# Patient Record
Sex: Male | Born: 1950 | Race: White | Hispanic: No | Marital: Married | Smoking: Former smoker
Health system: Southern US, Community
[De-identification: ages and names within clinical notes are randomized; demographics above are authoritative.]

## PROBLEM LIST (undated history)

## (undated) DIAGNOSIS — I10 Essential (primary) hypertension: Secondary | ICD-10-CM

## (undated) DIAGNOSIS — K219 Gastro-esophageal reflux disease without esophagitis: Secondary | ICD-10-CM

## (undated) DIAGNOSIS — N1831 Chronic kidney disease, stage 3a: Secondary | ICD-10-CM

## (undated) DIAGNOSIS — E785 Hyperlipidemia, unspecified: Secondary | ICD-10-CM

## (undated) DIAGNOSIS — Z951 Presence of aortocoronary bypass graft: Secondary | ICD-10-CM

## (undated) DIAGNOSIS — T7840XA Allergy, unspecified, initial encounter: Secondary | ICD-10-CM

## (undated) DIAGNOSIS — Z86010 Personal history of colonic polyps: Secondary | ICD-10-CM

## (undated) DIAGNOSIS — I4891 Unspecified atrial fibrillation: Secondary | ICD-10-CM

## (undated) DIAGNOSIS — I2510 Atherosclerotic heart disease of native coronary artery without angina pectoris: Secondary | ICD-10-CM

## (undated) DIAGNOSIS — R001 Bradycardia, unspecified: Secondary | ICD-10-CM

## (undated) DIAGNOSIS — M1990 Unspecified osteoarthritis, unspecified site: Secondary | ICD-10-CM

## (undated) DIAGNOSIS — I719 Aortic aneurysm of unspecified site, without rupture: Secondary | ICD-10-CM

## (undated) DIAGNOSIS — I219 Acute myocardial infarction, unspecified: Secondary | ICD-10-CM

## (undated) DIAGNOSIS — G4730 Sleep apnea, unspecified: Secondary | ICD-10-CM

## (undated) HISTORY — DX: Hyperlipidemia, unspecified: E78.5

## (undated) HISTORY — DX: Personal history of colonic polyps: Z86.010

## (undated) HISTORY — DX: Essential (primary) hypertension: I10

## (undated) HISTORY — DX: Allergy, unspecified, initial encounter: T78.40XA

## (undated) HISTORY — DX: Gastro-esophageal reflux disease without esophagitis: K21.9

## (undated) HISTORY — DX: Unspecified atrial fibrillation: I48.91

## (undated) HISTORY — DX: Presence of aortocoronary bypass graft: Z95.1

## (undated) HISTORY — PX: CORONARY ARTERY BYPASS GRAFT: SHX141

## (undated) HISTORY — DX: Aortic aneurysm of unspecified site, without rupture: I71.9

## (undated) HISTORY — DX: Chronic kidney disease, stage 3a: N18.31

## (undated) HISTORY — DX: Atherosclerotic heart disease of native coronary artery without angina pectoris: I25.10

## (undated) HISTORY — DX: Acute myocardial infarction, unspecified: I21.9

## (undated) HISTORY — DX: Unspecified osteoarthritis, unspecified site: M19.90

## (undated) HISTORY — PX: HERNIA REPAIR: SHX51

## (undated) HISTORY — PX: COLONOSCOPY: SHX174

## (undated) HISTORY — DX: Bradycardia, unspecified: R00.1

---

## 1999-10-12 DIAGNOSIS — Z951 Presence of aortocoronary bypass graft: Secondary | ICD-10-CM

## 1999-10-12 HISTORY — DX: Presence of aortocoronary bypass graft: Z95.1

## 2000-03-30 ENCOUNTER — Encounter: Payer: Self-pay | Admitting: Cardiology

## 2000-03-31 ENCOUNTER — Inpatient Hospital Stay (HOSPITAL_COMMUNITY): Admission: RE | Admit: 2000-03-31 | Discharge: 2000-04-04 | Payer: Self-pay | Admitting: Cardiology

## 2000-03-31 ENCOUNTER — Encounter: Payer: Self-pay | Admitting: Thoracic Surgery (Cardiothoracic Vascular Surgery)

## 2000-04-01 ENCOUNTER — Encounter: Payer: Self-pay | Admitting: Thoracic Surgery (Cardiothoracic Vascular Surgery)

## 2000-04-02 ENCOUNTER — Encounter: Payer: Self-pay | Admitting: Thoracic Surgery (Cardiothoracic Vascular Surgery)

## 2001-05-14 ENCOUNTER — Emergency Department (HOSPITAL_COMMUNITY): Admission: EM | Admit: 2001-05-14 | Discharge: 2001-05-14 | Payer: Self-pay | Admitting: Emergency Medicine

## 2001-05-14 ENCOUNTER — Encounter: Payer: Self-pay | Admitting: Emergency Medicine

## 2002-04-16 ENCOUNTER — Encounter: Payer: Self-pay | Admitting: Emergency Medicine

## 2002-04-16 ENCOUNTER — Emergency Department (HOSPITAL_COMMUNITY): Admission: EM | Admit: 2002-04-16 | Discharge: 2002-04-16 | Payer: Self-pay | Admitting: Emergency Medicine

## 2004-09-18 ENCOUNTER — Encounter (INDEPENDENT_AMBULATORY_CARE_PROVIDER_SITE_OTHER): Payer: Self-pay | Admitting: Specialist

## 2004-09-18 ENCOUNTER — Ambulatory Visit (HOSPITAL_COMMUNITY): Admission: RE | Admit: 2004-09-18 | Discharge: 2004-09-18 | Payer: Self-pay | Admitting: Gastroenterology

## 2005-01-18 ENCOUNTER — Ambulatory Visit: Payer: Self-pay

## 2005-01-25 ENCOUNTER — Ambulatory Visit: Payer: Self-pay | Admitting: *Deleted

## 2005-01-27 ENCOUNTER — Ambulatory Visit: Payer: Self-pay | Admitting: *Deleted

## 2005-06-04 ENCOUNTER — Ambulatory Visit: Payer: Self-pay | Admitting: *Deleted

## 2005-06-08 ENCOUNTER — Ambulatory Visit: Payer: Self-pay | Admitting: *Deleted

## 2005-06-27 ENCOUNTER — Encounter: Admission: RE | Admit: 2005-06-27 | Discharge: 2005-06-27 | Payer: Self-pay | Admitting: Neurology

## 2005-09-01 ENCOUNTER — Ambulatory Visit: Payer: Self-pay

## 2005-10-20 ENCOUNTER — Ambulatory Visit: Payer: Self-pay | Admitting: *Deleted

## 2006-06-07 ENCOUNTER — Ambulatory Visit: Payer: Self-pay | Admitting: *Deleted

## 2006-06-08 ENCOUNTER — Ambulatory Visit: Payer: Self-pay | Admitting: *Deleted

## 2006-07-08 ENCOUNTER — Ambulatory Visit: Payer: Self-pay

## 2006-12-20 ENCOUNTER — Ambulatory Visit: Payer: Self-pay

## 2007-01-26 ENCOUNTER — Ambulatory Visit: Payer: Self-pay | Admitting: *Deleted

## 2007-02-23 ENCOUNTER — Encounter: Admission: RE | Admit: 2007-02-23 | Discharge: 2007-02-23 | Payer: Self-pay | Admitting: *Deleted

## 2007-02-23 ENCOUNTER — Ambulatory Visit: Payer: Self-pay | Admitting: *Deleted

## 2007-08-24 ENCOUNTER — Ambulatory Visit: Payer: Self-pay | Admitting: *Deleted

## 2007-09-14 ENCOUNTER — Ambulatory Visit: Payer: Self-pay | Admitting: Cardiovascular Disease

## 2007-10-18 ENCOUNTER — Ambulatory Visit (HOSPITAL_COMMUNITY): Admission: RE | Admit: 2007-10-18 | Discharge: 2007-10-18 | Payer: Self-pay | Admitting: *Deleted

## 2007-10-18 ENCOUNTER — Ambulatory Visit: Payer: Self-pay | Admitting: *Deleted

## 2007-11-14 ENCOUNTER — Ambulatory Visit: Payer: Self-pay | Admitting: *Deleted

## 2007-12-01 ENCOUNTER — Inpatient Hospital Stay (HOSPITAL_COMMUNITY): Admission: RE | Admit: 2007-12-01 | Discharge: 2007-12-02 | Payer: Self-pay | Admitting: *Deleted

## 2007-12-01 ENCOUNTER — Ambulatory Visit: Payer: Self-pay | Admitting: *Deleted

## 2007-12-28 ENCOUNTER — Ambulatory Visit: Payer: Self-pay | Admitting: *Deleted

## 2007-12-28 ENCOUNTER — Encounter: Admission: RE | Admit: 2007-12-28 | Discharge: 2007-12-28 | Payer: Self-pay | Admitting: *Deleted

## 2008-06-27 ENCOUNTER — Encounter: Admission: RE | Admit: 2008-06-27 | Discharge: 2008-06-27 | Payer: Self-pay | Admitting: *Deleted

## 2008-06-27 ENCOUNTER — Ambulatory Visit: Payer: Self-pay | Admitting: *Deleted

## 2009-01-20 ENCOUNTER — Ambulatory Visit: Payer: Self-pay | Admitting: Cardiovascular Disease

## 2009-01-20 LAB — CONVERTED CEMR LAB
Albumin: 3.9 g/dL (ref 3.5–5.2)
Alkaline Phosphatase: 42 units/L (ref 39–117)
BUN: 23 mg/dL (ref 6–23)
Bilirubin, Direct: 0.5 mg/dL — ABNORMAL HIGH (ref 0.0–0.3)
CO2: 27 meq/L (ref 19–32)
Chloride: 106 meq/L (ref 96–112)
Creatinine, Ser: 1 mg/dL (ref 0.4–1.5)
LDL Cholesterol: 118 mg/dL — ABNORMAL HIGH (ref 0–99)
Total CHOL/HDL Ratio: 5

## 2009-01-22 DIAGNOSIS — E785 Hyperlipidemia, unspecified: Secondary | ICD-10-CM | POA: Insufficient documentation

## 2009-01-22 DIAGNOSIS — R001 Bradycardia, unspecified: Secondary | ICD-10-CM | POA: Insufficient documentation

## 2009-01-22 DIAGNOSIS — Z951 Presence of aortocoronary bypass graft: Secondary | ICD-10-CM | POA: Insufficient documentation

## 2009-01-22 DIAGNOSIS — I719 Aortic aneurysm of unspecified site, without rupture: Secondary | ICD-10-CM | POA: Insufficient documentation

## 2009-01-22 DIAGNOSIS — I10 Essential (primary) hypertension: Secondary | ICD-10-CM

## 2009-01-23 ENCOUNTER — Encounter: Payer: Self-pay | Admitting: Cardiovascular Disease

## 2009-01-23 ENCOUNTER — Ambulatory Visit: Payer: Self-pay | Admitting: Cardiovascular Disease

## 2009-01-23 DIAGNOSIS — F1010 Alcohol abuse, uncomplicated: Secondary | ICD-10-CM | POA: Insufficient documentation

## 2009-01-23 LAB — CONVERTED CEMR LAB
HDL goal, serum: 40 mg/dL
LDL Goal: 100 mg/dL

## 2009-02-13 ENCOUNTER — Ambulatory Visit: Payer: Self-pay | Admitting: *Deleted

## 2009-06-19 ENCOUNTER — Encounter (INDEPENDENT_AMBULATORY_CARE_PROVIDER_SITE_OTHER): Payer: Self-pay | Admitting: *Deleted

## 2009-07-01 ENCOUNTER — Telehealth: Payer: Self-pay | Admitting: Cardiovascular Disease

## 2009-09-01 ENCOUNTER — Ambulatory Visit: Payer: Self-pay | Admitting: Cardiovascular Disease

## 2009-09-18 ENCOUNTER — Encounter: Admission: RE | Admit: 2009-09-18 | Discharge: 2009-09-18 | Payer: Self-pay | Admitting: Vascular Surgery

## 2009-09-18 ENCOUNTER — Encounter: Payer: Self-pay | Admitting: Cardiovascular Disease

## 2009-09-18 ENCOUNTER — Ambulatory Visit: Payer: Self-pay | Admitting: Vascular Surgery

## 2009-10-20 ENCOUNTER — Ambulatory Visit: Payer: Self-pay | Admitting: Cardiovascular Disease

## 2009-10-22 LAB — CONVERTED CEMR LAB
ALT: 23 units/L (ref 0–53)
AST: 29 units/L (ref 0–37)
Bilirubin, Direct: 0.2 mg/dL (ref 0.0–0.3)
Cholesterol: 180 mg/dL (ref 0–200)
Total Protein: 7.3 g/dL (ref 6.0–8.3)
VLDL: 9.8 mg/dL (ref 0.0–40.0)

## 2009-10-23 ENCOUNTER — Ambulatory Visit: Payer: Self-pay | Admitting: Cardiovascular Disease

## 2009-10-29 ENCOUNTER — Telehealth: Payer: Self-pay | Admitting: Cardiovascular Disease

## 2009-11-03 ENCOUNTER — Telehealth: Payer: Self-pay | Admitting: Cardiovascular Disease

## 2009-11-12 ENCOUNTER — Telehealth: Payer: Self-pay | Admitting: Cardiovascular Disease

## 2009-11-17 ENCOUNTER — Telehealth: Payer: Self-pay | Admitting: Cardiovascular Disease

## 2009-11-18 ENCOUNTER — Ambulatory Visit: Payer: Self-pay | Admitting: Cardiovascular Disease

## 2009-11-18 DIAGNOSIS — F411 Generalized anxiety disorder: Secondary | ICD-10-CM | POA: Insufficient documentation

## 2009-11-19 ENCOUNTER — Ambulatory Visit: Payer: Self-pay | Admitting: Licensed Clinical Social Worker

## 2009-11-19 ENCOUNTER — Encounter: Payer: Self-pay | Admitting: Cardiovascular Disease

## 2009-11-24 ENCOUNTER — Ambulatory Visit: Payer: Self-pay | Admitting: Licensed Clinical Social Worker

## 2010-01-06 ENCOUNTER — Ambulatory Visit: Payer: Self-pay | Admitting: Cardiovascular Disease

## 2010-06-11 DIAGNOSIS — Z860100 Personal history of colon polyps, unspecified: Secondary | ICD-10-CM

## 2010-06-11 HISTORY — DX: Personal history of colonic polyps: Z86.010

## 2010-06-11 HISTORY — DX: Personal history of colon polyps, unspecified: Z86.0100

## 2010-08-07 ENCOUNTER — Encounter: Admission: RE | Admit: 2010-08-07 | Discharge: 2010-08-07 | Payer: Self-pay | Admitting: Family Medicine

## 2010-10-08 ENCOUNTER — Ambulatory Visit: Payer: Self-pay | Admitting: Vascular Surgery

## 2010-10-22 ENCOUNTER — Encounter: Payer: Self-pay | Admitting: Cardiovascular Disease

## 2010-11-10 NOTE — Consult Note (Signed)
Summary: Behavorial Medicine Note  Behavorial Medicine Note   Imported By: Marylou Mccoy 12/09/2009 12:59:31  _____________________________________________________________________  External Attachment:    Type:   Image     Comment:   External Document

## 2010-11-10 NOTE — Assessment & Plan Note (Signed)
Summary: 6wk f/u sl   Visit Type:  6 weeks follow up Primary Provider:  Theodoro Doing MD  CC:  No cardiac complains.  History of Present Illness: [name] Brown is seen today in F/U of AAA, CAD distant CABG in 2001 with no ischemia on myovue in 2007.  The last time I saw him he had an acute psychiatric issue with anxiety about his job requirements.  We got him in to see behavioral health and he is doing much better now.  He denies SOB, palpitaotns, SSCP, edema or syncope.  Post op EF is normal.  He did have a radial graft as part of his surgery.    Current Problems (verified): 1)  Anxiety State, Unspecified  (ICD-300.00) 2)  Alcohol Use  (ICD-305.00) 3)  Bradycardia  (ICD-427.89) 4)  Hyperlipidemia  (ICD-272.4) 5)  Hypertension  (ICD-401.9) 6)  Cad  (ICD-414.00) 7)  Aortic Aneurysm  (ICD-441.9)  Current Medications (verified): 1)  Altace 10 Mg Caps (Ramipril) .... Take 1 By Mouth Once Daily 2)  Hydrochlorothiazide 12.5 Mg Caps (Hydrochlorothiazide) .Marland Kitchen.. 1 Tab By Mouth Once Daily 3)  Zetia 10 Mg Tabs (Ezetimibe) .Marland Kitchen.. 1 Tab By Mouth Once Daily 4)  Centrum Silver  Tabs (Multiple Vitamins-Minerals) .Marland Kitchen.. 1 Tab By Mouth Once Daily 5)  Aspirin Ec 325 Mg Tbec (Aspirin) .... Take One Tablet By Mouth Daily 6)  Doxycycline Monohydrate .... 1/4 of A Tab By Mouth Two Times A Day 7)  Glucosamine-Chondroitin   Caps (Glucosamine-Chondroit-Vit C-Mn) .Marland Kitchen.. 1 Tab By Mouth Once Daily 8)  Niacin 500 Mg Tabs (Niacin) .... 2 Tabs Two Times A Day 9)  Verapamil Hcl 120 Mg Tabs (Verapamil Hcl) .Marland Kitchen.. 1 Tab By Mouth Three Times A Day 10)  Lopressor 50 Mg Tabs (Metoprolol Tartrate) .... Take 1/2 Tablet Two Times A Day  Allergies (verified): No Known Drug Allergies  Past History:  Past Medical History: Last updated: 02/09/09 Current Problems:  BRADYCARDIA (ICD-427.89) HYPERLIPIDEMIA (ICD-272.4) HYPERTENSION (ICD-401.9) CAD (ICD-414.00) CABG 2001 AORTIC ANEURYSM (ICD-441.9)  Past Surgical History: Last updated:  Feb 09, 2009 AAA stent graft on 12/01/2007 at [facility]. coronary bypass in 2001. Dr. Cornelius Moras.  Family History: Last updated: 02/09/09  Mother died at age 36 with complications of an MI.   Father had seven MIs prior to his death at age 34.   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  Alcohol Use - yes  Review of Systems       Denies fever, malais, weight loss, blurry vision, decreased visual acuity, cough, sputum, SOB, hemoptysis, pleuritic pain, palpitaitons, heartburn, abdominal pain, melena, lower extremity edema, claudication, or rash.   Vital Signs:  Patient profile:   60 year old male Height:      70 inches Weight:      193.25 pounds BMI:     27.83 Pulse rate:   42 / minute Pulse rhythm:   irregular Resp:     18 per minute BP sitting:   142 / 70  (left arm) Cuff size:   large  Vitals Entered By: Vikki Ports (January 06, 2010 9:20 AM)  Physical Exam  General:  Affect appropriate Healthy:  appears stated age HEENT: normal Neck supple with no adenopathy JVP normal no bruits no thyromegaly Lungs clear with no wheezing and good diaphragmatic motion Heart:  S1/S2 no murmur,rub, gallop or click PMI normal Abdomen: benighn, BS positve, no tenderness, no  AAA no bruit.  No HSM or HJR Distal pulses intact with no bruits No edema Neuro non-focal Skin warm and dry    Impression & Recommendations:  Problem # 1:  HYPERLIPIDEMIA (ICD-272.4) Intolerant to statins.  Continue meds and diet His updated medication list for this problem includes:    Zetia 10 Mg Tabs (Ezetimibe) .Marland Kitchen... 1 tab by mouth once daily    Niacin 500 Mg Tabs (Niacin) .Marland Kitchen... 2 tabs two times a day  CHOL: 180 (10/20/2009)   LDL: 127 (10/20/2009)   HDL: 42.80 (10/20/2009)   TG: 49.0 (10/20/2009) CHOL (goal): 200 (01/23/2009)   LDL (goal): 100 (01/23/2009)   HDL (goal): 40 (01/23/2009)    TG (goal): 150 (01/23/2009)  Problem # 2:  HYPERTENSION (ICD-401.9) Well controlled His updated medication list for this problem includes:    Altace 10 Mg Caps (Ramipril) .Marland Kitchen... Take 1 by mouth once daily    Hydrochlorothiazide 12.5 Mg Caps (Hydrochlorothiazide) .Marland Kitchen... 1 tab by mouth once daily    Aspirin Ec 325 Mg Tbec (Aspirin) .Marland Kitchen... Take one tablet by mouth daily    Verapamil Hcl 120 Mg Tabs (Verapamil hcl) .Marland Kitchen... 1 tab by mouth three times a day    Lopressor 50 Mg Tabs (Metoprolol tartrate) .Marland Kitchen... Take 1/2 tablet two times a day  Problem # 3:  CAD (ICD-414.00) Stable no angina His updated medication list for this problem includes:    Altace 10 Mg Caps (Ramipril) .Marland Kitchen... Take 1 by mouth once daily    Aspirin Ec 325 Mg Tbec (Aspirin) .Marland Kitchen... Take one tablet by mouth daily    Verapamil Hcl 120 Mg Tabs (Verapamil hcl) .Marland Kitchen... 1 tab by mouth three times a day    Lopressor 50 Mg Tabs (Metoprolol tartrate) .Marland Kitchen... Take 1/2 tablet two times a day  Problem # 4:  AORTIC ANEURYSM (ICD-441.9) S/P repair with no residual. [place] in a year  Problem # 5:  ANXIETY STATE, UNSPECIFIED (ICD-300.00) Improved continue F/U behavioral health.

## 2010-11-10 NOTE — Progress Notes (Signed)
Summary: med question  Phone Note Call from Patient Call back at [facility] Phone [phone]   Caller: Patient Reason for Call: Talk to Nurse Summary of Call: req call back about meds,PT REALLY WANT A CALL BACK ABOUT HIS MEDICATIONS. Initial call taken by: Migdalia Dk,  November 17, 2009 9:24 AM  Follow-up for Phone Call        spoke with pt, he has been having problems with side effects from his meds. he states he has lost his temper at work, been depressed, lost his sex drive and his appetite has changed. he feels this is due to the changing of his meds to get bp control. he has been taken out of work until he sees dr Eden Emms to talk about his meds and what is going on. pt scheduled to see dr Eden Emms tomorrow.pt had recently spoke with dr Eden Emms and was told to restart previous meds. script sent to pharm  Deliah Goody, RN  November 17, 2009 11:43 AM     New/Updated Medications: METOPROLOL TARTRATE 50 MG TABS (METOPROLOL TARTRATE) Take one half tablet by mouth twice a day Prescriptions: METOPROLOL TARTRATE 50 MG TABS (METOPROLOL TARTRATE) Take one half tablet by mouth twice a day  #30 x 12   Entered by:   Deliah Goody, RN   Authorized by:   Colon Branch, MD, [facility]   Signed by:   Deliah Goody, RN on 11/17/2009   Method used:   [facility] to        [facility]  [phone]* (retail)       [address] [place], [place]  [address]       Ph: [phone] or [phone]       Fax: [phone]   RxID:   [id number]

## 2010-11-10 NOTE — Assessment & Plan Note (Signed)
Summary: PER CHECK OTU/SF   Primary Provider:  Theodoro Doing MD  CC:  check up last visit pt was told to stop lopressor.  History of Present Illness: [name] Brown is seen today in F/U of AAA, CAD distant CABG in 2001 with no ischemia on myovue in 2007.  He has hyperlipidemia on Zetia and Niaspan.  He is intolerant to statins with severe myalgias.  His BP is well controlled but he has excessive bradycardia.  He is asymptomatic with no light headednss or presyncope.  He has a AAA that is followed by Dr. Madilyn Fireman and there is no endoleak.  I told him we would stop his BB and see how his HR does.  The patient does not want to stop his verapamil as it cured his cluster headaches. Since stopping his BB his HR is now in the low to mid 60's but his BP is elevated.  I told him we would add hydralizine and re-evaluate.  He seems to be ok with this plan  Current Problems (verified): 1)  Alcohol Use  (ICD-305.00) 2)  Bradycardia  (ICD-427.89) 3)  Hyperlipidemia  (ICD-272.4) 4)  Hypertension  (ICD-401.9) 5)  Cad  (ICD-414.00) 6)  Aortic Aneurysm  (ICD-441.9)  Current Medications (verified): 1)  Altace 10 Mg Caps (Ramipril) .... Take 1 By Mouth Once Daily 2)  Hydrochlorothiazide 12.5 Mg Caps (Hydrochlorothiazide) .Marland Kitchen.. 1 Tab By Mouth Once Daily 3)  Zetia 10 Mg Tabs (Ezetimibe) .Marland Kitchen.. 1 Tab By Mouth Once Daily 4)  Centrum Silver  Tabs (Multiple Vitamins-Minerals) .Marland Kitchen.. 1 Tab By Mouth Once Daily 5)  Aspirin Ec 325 Mg Tbec (Aspirin) .... Take One Tablet By Mouth Daily 6)  Verapamil Hcl Cr 120 Mg Xr24h-Cap (Verapamil Hcl) .... Take One Capsule Three Times A Day 7)  Doxycycline Monohydrate .... 1/4 of A Tab By Mouth Two Times A Day 8)  Glucosamine-Chondroitin   Caps (Glucosamine-Chondroit-Vit C-Mn) .Marland Kitchen.. 1 Tab By Mouth Once Daily 9)  Niacin 500 Mg Tabs (Niacin) .... 2 Tabs Two Times A Day 10)  Hydralazine Hcl 50 Mg Tabs (Hydralazine Hcl) .... One By Mouth Twice A Day  Allergies (verified): No Known Drug Allergies  Past  History:  Past Medical History: Last updated: 01/29/09 Current Problems:  BRADYCARDIA (ICD-427.89) HYPERLIPIDEMIA (ICD-272.4) HYPERTENSION (ICD-401.9) CAD (ICD-414.00) CABG 2001 AORTIC ANEURYSM (ICD-441.9)  Past Surgical History: Last updated: 29-Jan-2009 AAA stent graft on 12/01/2007 at [facility]. coronary bypass in 2001. Dr. Cornelius Moras.  Family History: Last updated: 01-29-09  Mother died at age 76 with complications of an MI.   Father had seven MIs prior to his death at age 7.   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  Alcohol Use - yes  Review of Systems       Denies fever, malais, weight loss, blurry vision, decreased visual acuity, cough, sputum, SOB, hemoptysis, pleuritic pain, palpitaitons, heartburn, abdominal pain, melena, lower extremity edema, claudication, or rash. All other sysmptoms reviewed and negative  Vital Signs:  Patient profile:   60 year old male Height:      70 inches Weight:      191 pounds BMI:     27.50 Pulse rate:   54 / minute Resp:     12 per minute BP sitting:   154 / 80  (left arm)  Vitals Entered By: Kem Parkinson (October 23, 2009 4:05 PM)  Physical Exam  General:  Affect appropriate Healthy:  appears stated age HEENT: normal Neck supple with no adenopathy JVP normal no bruits no thyromegaly Lungs clear with no wheezing and good diaphragmatic motion Heart:  S1/S2 no murmur,rub, gallop or click PMI normal Abdomen: benighn, BS positve, no tenderness, no AAA no bruit.  No HSM or HJR Distal pulses intact with no bruits No edema Neuro non-focal Skin warm and dry    Impression & Recommendations:  Problem # 1:  BRADYCARDIA (ICD-427.89) Resolved off BB His updated medication list for this problem includes:    Altace 10 Mg Caps (Ramipril) .Marland Kitchen... Take 1 by mouth once daily    Aspirin Ec 325 Mg Tbec  (Aspirin) .Marland Kitchen... Take one tablet by mouth daily    Verapamil Hcl Cr 120 Mg Xr24h-cap (Verapamil hcl) .Marland Kitchen... Take one capsule three times a day  Problem # 2:  HYPERLIPIDEMIA (ICD-272.4) Increase Niacin.  Intolerant to statins His updated medication list for this problem includes:    Zetia 10 Mg Tabs (Ezetimibe) .Marland Kitchen... 1 tab by mouth once daily    Niacin 500 Mg Tabs (Niacin) .Marland Kitchen... 2 tabs two times a day  Problem # 3:  HYPERTENSION (ICD-401.9) Add hydralazine.  F/U in 8 weeks His updated medication list for this problem includes:    Altace 10 Mg Caps (Ramipril) .Marland Kitchen... Take 1 by mouth once daily    Hydrochlorothiazide 12.5 Mg Caps (Hydrochlorothiazide) .Marland Kitchen... 1 tab by mouth once daily    Aspirin Ec 325 Mg Tbec (Aspirin) .Marland Kitchen... Take one tablet by mouth daily    Verapamil Hcl Cr 120 Mg Xr24h-cap (Verapamil hcl) .Marland Kitchen... Take one capsule three times a day    Hydralazine Hcl 50 Mg Tabs (Hydralazine hcl) ..... One by mouth twice a day  Patient Instructions: 1)  Your physician recommends that you schedule a follow-up appointment in: 8 to 10 weeks with Dr Eden Emms 2)  Your physician has recommended you make the following change in your medication: Start Hydralazine 50 mg one twice a day Prescriptions: HYDRALAZINE HCL 50 MG TABS (HYDRALAZINE HCL) one by mouth twice a day  #60 x 11   Entered by:   Charolotte Capuchin, RN   Authorized by:   Colon Branch, MD, [facility]   Signed by:   Charolotte Capuchin, RN on 10/23/2009   Method used:   [facility]ron[facility] to        [facility]  [phone]* (retail)       [address] [place], [place]  [address]       Ph: [phone] or [phone]       Fax: [phone]   RxID:   [id number]

## 2010-11-10 NOTE — Progress Notes (Signed)
Summary: BP  Phone Note Call from Patient Call back at Work Phone [phone]   Caller: Patient Summary of Call: BP is still going out of wack.... tired of dealing with this, request to speak to dr Initial call taken by: Migdalia Dk,  November 12, 2009 8:17 AM  Follow-up for Phone Call        spoke with pt, he has been taking clonidine for one week now and his bp is averaging 150/90 and his heart rate is 40. he is tired of changing meds and wants to go back to what he was originally on. will foward to dr Eden Emms for his review Deliah Goody, RN  November 12, 2009 9:00 AM    Additional Follow-up for Phone Call Additional follow up Details #1::        Left message for patient.  Ok to go back to old regimen.  F/U with me in 4 weeks Additional Follow-up by: Colon Branch, MD, [facility],  November 12, 2009 4:47 PM

## 2010-11-10 NOTE — Progress Notes (Signed)
Summary: bp meds  Phone Note Call from Patient Call back at [phone]   Caller: Patient Reason for Call: Talk to Nurse Summary of Call: [name] Brown request new BP meds Initial call taken by: Migdalia Dk,  November 03, 2009 8:05 AM  Follow-up for Phone Call        spoke with [name] Brown, the hydralazine is causing [name] Brown to have a headache all the time, he is not sleeping well. bp cont to be elevated 150/80. he is currently taking verapamil 120mg  three times a day. he has stopped the hydralazine. he states he is waking in the middle of the night and his bp is 190/100. [name] Brown requesting different bp med.  will foward to dr Eden Emms for his  review Deliah Goody, RN  November 03, 2009 9:54 AM   Additional Follow-up for Phone Call Additional follow up Details #1::        Call in clonidine .1 mg two times a day and stop hydralazine Additional Follow-up by: Colon Branch, MD, [facility],  November 03, 2009 11:51 AM     Appended Document: bp meds [name] Brown aware of med change and will call with any problems Deliah Goody, RN  November 04, 2009 11:08 AM    Clinical Lists Changes  Medications: Changed medication from HYDRALAZINE HCL 50 MG TABS (HYDRALAZINE HCL) 1 tablet 3 times per day to CLONIDINE HCL 0.1 MG TABS (CLONIDINE HCL) Take one tablet by mouth twice a day - Signed Rx of CLONIDINE HCL 0.1 MG TABS (CLONIDINE HCL) Take one tablet by mouth twice a day;  #60 x 12;  Signed;  Entered by: Deliah Goody, RN;  Authorized by: Colon Branch, MD, [facility];  Method used: [facility] to [facility]  [phone]*, [address], [place], [place]  [address], Ph: [phone] or [phone], Fax: [phone]    Prescriptions: CLONIDINE HCL 0.1 MG TABS (CLONIDINE HCL) Take one tablet by mouth twice a day  #60 x 12   Entered by:   Deliah Goody, RN   Authorized by:   Colon Branch, MD, [facility]   Signed by:   Deliah Goody, RN on 11/04/2009   Method used:   Electronically to        [facility]  [phone]*  (retail)       [address] [place], [place]  [address]       Ph: [phone] or [phone]       Fax: [phone]   RxID:   [id number]
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
## 2010-11-10 NOTE — Progress Notes (Signed)
Summary: pt having problem with b/p meds  Phone Note Call from Patient Call back at Work Phone [phone]   Caller: Patient Reason for Call: Talk to Nurse, Talk to Doctor Summary of Call: per pt call his b/p meds was changed about a week ago and he is having trouble with it and he wants to talk to you about it Initial call taken by: Omer Jack,  October 29, 2009 8:09 AM  Follow-up for Phone Call        pt calling back about his b/p.sign Follow-up by: Judie Grieve,  October 29, 2009 12:57 PM  Additional Follow-up for Phone Call Additional follow up Details #1::        pt calling again, req call back asap,Jennifer [facility]  October 29, 2009 1:43 PM  I spoke with the pt- He states his bp has been running up since coming off lopressor and starting on hydralazine 50mg  two times a day. He reports waking up during the night with HA's and his bp running about 170/100 around 2 am with HR- 58-60, and around 6am his bp is around 155/95 HR- 62. The pt states he feels out of breath. He takes his hydralazine around 6a/6p. I verified that the pt is taking his other medications as prescribed. We will discuss this with Dr. Eden Emms and call the pt back today @ [phone]. The pt is agreeable.  Additional Follow-up by: Sherri Rad, RN, BSN,  October 29, 2009 2:53 PM    Additional Follow-up for Phone Call Additional follow up Details #2::    Discussed above issue with Dr.Malissie Musgrave ...he advised that patient increase verapamil to 240mg  for am and evening dose and keep the mid-day dose at 120mg . He also advised that patient increase Hydralazine to 50 mg 3 times per day. Patient aware of all medication changes and will call back if BP does not improve. Follow-up by: J REISS RN  New/Updated Medications: VERAPAMIL HCL 120 MG TABS (VERAPAMIL HCL) 2 tablets in am, 1 tablet mid-day,   and 2 tablets in pm HYDRALAZINE HCL 50 MG TABS (HYDRALAZINE HCL) 1 tablet 3 times per day

## 2010-12-16 ENCOUNTER — Telehealth: Payer: Self-pay | Admitting: Cardiovascular Disease

## 2010-12-22 NOTE — Progress Notes (Signed)
Summary: pt calling blood work  Phone Note Call from Patient   Caller: Patient [phone] Reason for Call: Talk to Nurse Summary of Call: pt calling re appt 3-29 wants blood work at [facility] Initial call taken by: Glynda Jaeger,  December 16, 2010 9:35 AM  Follow-up for Phone Call        spoke with pt, labs ordered Deliah Goody, RN  December 16, 2010 3:55 PM

## 2010-12-25 ENCOUNTER — Encounter: Payer: Self-pay | Admitting: Cardiovascular Disease

## 2011-01-01 ENCOUNTER — Other Ambulatory Visit (INDEPENDENT_AMBULATORY_CARE_PROVIDER_SITE_OTHER): Payer: 59

## 2011-01-01 ENCOUNTER — Encounter: Payer: Self-pay | Admitting: *Deleted

## 2011-01-01 DIAGNOSIS — I2510 Atherosclerotic heart disease of native coronary artery without angina pectoris: Secondary | ICD-10-CM

## 2011-01-01 DIAGNOSIS — E7800 Pure hypercholesterolemia, unspecified: Secondary | ICD-10-CM

## 2011-01-01 DIAGNOSIS — I10 Essential (primary) hypertension: Secondary | ICD-10-CM

## 2011-01-01 DIAGNOSIS — Z79899 Other long term (current) drug therapy: Secondary | ICD-10-CM

## 2011-01-01 LAB — HEPATIC FUNCTION PANEL
ALT: 19 U/L (ref 0–53)
Alkaline Phosphatase: 50 U/L (ref 39–117)
Bilirubin, Direct: 0.1 mg/dL (ref 0.0–0.3)
Total Bilirubin: 0.9 mg/dL (ref 0.3–1.2)
Total Protein: 6.5 g/dL (ref 6.0–8.3)

## 2011-01-01 LAB — BASIC METABOLIC PANEL
GFR: 91.78 mL/min (ref >60.00)
Potassium: 4.6 mEq/L (ref 3.5–5.1)
Sodium: 140 mEq/L (ref 135–145)

## 2011-01-01 LAB — CBC WITH DIFFERENTIAL/PLATELET
Basophils Absolute: 0 10*3/uL (ref 0.0–0.1)
Basophils Relative: 0.5 % (ref 0.0–3.0)
Eosinophils Relative: 3.2 % (ref 0.0–5.0)
Hemoglobin: 14.5 g/dL (ref 13.0–17.0)
Lymphocytes Relative: 25.2 % (ref 12.0–46.0)
Monocytes Relative: 9.7 % (ref 3.0–12.0)
Neutro Abs: 4.4 10*3/uL (ref 1.4–7.7)
RBC: 4.3 Mil/uL (ref 4.22–5.81)
WBC: 7.2 10*3/uL (ref 4.5–10.5)

## 2011-01-01 LAB — LIPID PANEL
Total CHOL/HDL Ratio: 5
Triglycerides: 75 mg/dL (ref 0.0–149.0)

## 2011-01-01 LAB — TSH: TSH: 0.92 u[IU]/mL (ref 0.35–5.50)

## 2011-01-04 ENCOUNTER — Telehealth: Payer: Self-pay | Admitting: *Deleted

## 2011-01-04 NOTE — Telephone Encounter (Signed)
pt aware of results  

## 2011-01-04 NOTE — Telephone Encounter (Signed)
Message copied by Deliah Goody on Mon Jan 04, 2011  4:44 PM ------      Message from: Charlton Haws      Created: Mon Jan 04, 2011  9:09 AM       Labs reviewed and are normal.  No changes in medication or F/U needed.

## 2011-01-07 ENCOUNTER — Ambulatory Visit (INDEPENDENT_AMBULATORY_CARE_PROVIDER_SITE_OTHER): Payer: 59 | Admitting: Cardiovascular Disease

## 2011-01-07 ENCOUNTER — Encounter: Payer: Self-pay | Admitting: Cardiovascular Disease

## 2011-01-07 ENCOUNTER — Other Ambulatory Visit: Payer: Self-pay | Admitting: Cardiovascular Disease

## 2011-01-07 VITALS — BP 126/74 | HR 40 | Resp 18 | Ht 71.0 in | Wt 192.4 lb

## 2011-01-07 DIAGNOSIS — I2510 Atherosclerotic heart disease of native coronary artery without angina pectoris: Secondary | ICD-10-CM

## 2011-01-07 DIAGNOSIS — I498 Other specified cardiac arrhythmias: Secondary | ICD-10-CM

## 2011-01-07 DIAGNOSIS — F411 Generalized anxiety disorder: Secondary | ICD-10-CM

## 2011-01-07 DIAGNOSIS — Z951 Presence of aortocoronary bypass graft: Secondary | ICD-10-CM

## 2011-01-07 DIAGNOSIS — I719 Aortic aneurysm of unspecified site, without rupture: Secondary | ICD-10-CM

## 2011-01-07 DIAGNOSIS — I10 Essential (primary) hypertension: Secondary | ICD-10-CM

## 2011-01-07 DIAGNOSIS — F1010 Alcohol abuse, uncomplicated: Secondary | ICD-10-CM

## 2011-01-07 DIAGNOSIS — E785 Hyperlipidemia, unspecified: Secondary | ICD-10-CM

## 2011-01-07 MED ORDER — METOPROLOL TARTRATE 50 MG PO TABS: 50.0000 mg | ORAL_TABLET | Freq: Every day | ORAL | Status: DC

## 2011-01-07 NOTE — Assessment & Plan Note (Signed)
Normal exam.  [place] no endoleak and AAA decrease in size.  F/U Dr Edilia Bo [facility]

## 2011-01-07 NOTE — Assessment & Plan Note (Signed)
Well controlled  Continue meds and low sodium DASH type diet

## 2011-01-07 NOTE — Assessment & Plan Note (Signed)
Considering he cant take statins LDL of 117 ok.  Dietary counseling provided

## 2011-01-07 NOTE — Patient Instructions (Addendum)
Your physician recommends that you schedule a follow-up appointment in: 6 months  Decrease lopresser to once/day

## 2011-01-07 NOTE — Assessment & Plan Note (Signed)
Stabel with no angina  Continue ASA

## 2011-01-07 NOTE — Letter (Signed)
Summary: Custom - Lipid  [facility], Main Office  [address]   [place], [place] [address]   Phone: [phone]  Fax: [phone]     January 01, 2011 MRN: [id number]   [facility] [address] [place], [place]  [address]   Dear [name] Brown,  We have reviewed your cholesterol results.  They are as follows:     Total Cholesterol:    167 (Desirable: less than 200)       HDL  Cholesterol:     36.20  (Desirable: greater than 40 for men and 50 for women)       LDL Cholesterol:       116  (Desirable: less than 100 for low risk and less than 70 for moderate to high risk)       Triglycerides:       75.0  (Desirable: less than 150)  Our recommendations include:These numbers look good. Continue on the same medicine. Sodium, potassium, blood count, kidney, thyroid and Liver function are all normal. Take care, Dr. Leanora Cover   Call our office at the number listed above if you have any questions.  Lowering your LDL cholesterol is important, but it is only one of a large number of "risk factors" that may indicate that you are at risk for heart disease, stroke or other complications of hardening of the arteries.  Other risk factors include:   A.  Cigarette Smoking* B.  High Blood Pressure* C.  Obesity* D.   Low HDL Cholesterol (see yours above)* E.   Diabetes Mellitus (higher risk if your is uncontrolled) F.  Family history of premature heart disease G.  Previous history of stroke or cardiovascular disease    *These are risk factors YOU HAVE CONTROL OVER.  For more information, visit .  There is now evidence that lowering the TOTAL CHOLESTEROL AND LDL CHOLESTEROL can reduce the risk of heart disease.  The American Heart Association recommends the following guidelines for the treatment of elevated cholesterol:  1.  If there is now current heart disease and less than two risk factors, TOTAL CHOLESTEROL should be less than 200 and LDL CHOLESTEROL should be less  than 100. 2.  If there is current heart disease or two or more risk factors, TOTAL CHOLESTEROL should be less than 200 and LDL CHOLESTEROL should be less than 70.  A diet low in cholesterol, saturated fat, and calories is the cornerstone of treatment for elevated cholesterol.  Cessation of smoking and exercise are also important in the management of elevated cholesterol and preventing vascular disease.  Studies have shown that 30 to 60 minutes of physical activity most days can help lower blood pressure, lower cholesterol, and keep your weight at a healthy level.  Drug therapy is used when cholesterol levels do not respond to therapeutic lifestyle changes (smoking cessation, diet, and exercise) and remains unacceptably high.  If medication is started, it is important to have you levels checked periodically to evaluate the need for further treatment options.  Thank you,    [employer] Team

## 2011-01-07 NOTE — Assessment & Plan Note (Signed)
Much improved.  Continue F/U with primary and behavioral health

## 2011-01-07 NOTE — Progress Notes (Signed)
[name] Brown is seen today for F/U of elevated lipids, HTN, CABG in 2001, AAA with endograf 2009.  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  Compliant with meds.  Denies SSCP, dyspnea, palpitations, abdominal pain edema.  Some bradycardia in past with beta blockers and severe myalgias with statins.  Las LDL this month 117.  Reviewed records and he has had severe myalias with all statins and elevated CPK's.  Called Dr Adele Dan office and reviewed records.  Had endograf 2009 for 5.3 cm aneurysm.  [place] 10/22/10 No endoleak and residual aneurysm 3 3.7 cm decreased in size.  Disucssed bradycardia with him.  Will take lopresser once per day.. Reviewed his home BP/HR readings and BP ok but HR in 40's majority of time.  Patient hesitant to stop lopresser totally but will cut back.  Wants to stary on verapamil as this helps with cluster headaches.  No symptomatic bradycardia.  Still employed at [employer] in [place].  Stress level much better.  Abstaining from drugs and alchohol.  ROS: Denies fever, malais, weight loss, blurry vision, decreased visual acuity, cough, sputum, SOB, hemoptysis, pleuritic pain, palpitaitons, heartburn, abdominal pain, melena, lower extremity edema, claudication, or rash.   General: Affect appropriate Healthy:  appears stated age HEENT: normal Neck supple with no adenopathy JVP normal no bruits no thyromegaly Lungs clear with no wheezing and good diaphragmatic motion Heart:  S1/S2 no murmur,rub, gallop or click PMI normal Abdomen: benighn, BS positve, no tenderness, no AAA no bruit.  No HSM or HJR Distal pulses intact with no bruits No edema Neuro non-focal Skin warm and dry No muscular weakness   Current Outpatient Prescriptions  Medication Sig Dispense Refill  . aspirin 325 MG tablet Take 325 mg by mouth daily.        Marland Kitchen doxycycline (VIBRAMYCIN) 100 MG capsule Take 100 mg by mouth. 1/4 tab 2 times daily       . ezetimibe (ZETIA) 10 MG tablet Take 10  mg by mouth daily.        . fish oil-omega-3 fatty acids 1000 MG capsule Take 1 g by mouth 2 (two) times daily.        . Glucosamine-Chondroitin (GLUCOSAMINE CHONDR COMPLEX PO) 500 mg 2 times daily       . hydrochlorothiazide (,MICROZIDE/HYDRODIURIL,) 12.5 MG capsule Take 12.5 mg by mouth daily.        . metoprolol (LOPRESSOR) 50 MG tablet Take 1 tablet (50 mg total) by mouth daily. 1/2 tab po once daily      . Multiple Vitamins-Minerals (CENTRUM PO) 1 tab po qd       . niacin (NIASPAN) 1000 MG CR tablet Take 1,000 mg by mouth 2 (two) times daily.        . ramipril (ALTACE) 10 MG tablet Take 10 mg by mouth daily.        . verapamil (VERELAN PM) 120 MG 24 hr capsule 1 tab po tid       . DISCONTD: metoprolol (LOPRESSOR) 50 MG tablet 1/2 tab po bid         Allergies  Statins  Electrocardiogram:  Marked sinus bradycardia 40  Otherwise normal  Assessment and Plan

## 2011-01-07 NOTE — Assessment & Plan Note (Signed)
Decrease lopresser.  I think he can come off it completely but patient like low HR and wants to stay on some.  Will call us in 4 weeks if HR at home not at least in 50's

## 2011-02-23 NOTE — Assessment & Plan Note (Signed)
OFFICE VISIT   [name] Brown, [name] Brown  DOB:  1950-12-25                                       09/18/2009  ZOXWR#:[id number]   I saw the patient in the office today for followup of his endovascular  aneurysm repair.  He had a 5.3 cm infrarenal abdominal aortic aneurysm  and underwent endovascular repair by Dr. Madilyn Fireman on 12/01/2007.  On his  last followup visit the aorta had shrunk down to 4.5 cm.  He comes in  for a 6 month followup visit.  Since he was seen last he denies any  abdominal or back pain.   REVIEW OF SYSTEMS:  He has had no recent chest pain, chest pressure,  palpitations or arrhythmias.  He has had no claudication, rest pain or  nonhealing ulcers.  PULMONARY:  He has had no productive cough, bronchitis, asthma or  wheezing.   PHYSICAL EXAMINATION:  General:  This is a pleasant 60 year old  gentleman who appears his stated age.  Vital signs:  Blood pressure is  184/92, heart rate is 54.  Temperature is 98.  Lungs:  Are clear  bilaterally to auscultation.  Cardiovascular:  I do not detect any  carotid bruits.  He has a regular rate and rhythm without murmur  appreciated.  He has palpable femoral and pedal pulses bilaterally.  He  has a palpable right radial pulse.  He has had a previous left radial  artery harvest for CABG.  Abdomen:  Soft and nontender.  I cannot  palpate his aneurysm.  Neurological:  He has no focal weakness or  paresthesias.   I reviewed his CT scan and by my measurement the aneurysm has continued  to decrease in size and now measures 3.4 cm in maximum diameter.  There  is no evidence of endo leak and the stent graft is in good position.   Given that the aneurysm has shrunk down quite nicely I think at this  point it is safe to stretch his followup out to 1 year.  We will see him  back in 1 year for followup duplex scan.  He knows to call sooner if he  has problems.   Di Kindle. Edilia Bo, M.D.  Electronically Signed   CSD/MEDQ  D:  09/18/2009  T:  09/19/2009  Job:  [id number]   cc:   Jethro Bastos, M.D.  Noralyn Pick. Eden Emms, MD, [facility]

## 2011-02-23 NOTE — Op Note (Signed)
[name] Brown, [name] Brown               ACCOUNT NO.:  [id number]   MEDICAL RECORD NO.:  [id number]          PATIENT TYPE:  AMB   LOCATION:  SDS                          FACILITY:  [facility]   PHYSICIAN:  Balinda Quails, M.D.    DATE OF BIRTH:  Aug 28, 1951   DATE OF PROCEDURE:  10/18/2007  DATE OF DISCHARGE:                               OPERATIVE REPORT   PREOPERATIVE DIAGNOSIS:  A 5.3 cm abdominal aortic aneurysm.   POSTOPERATIVE DIAGNOSIS:  A 5.3 cm abdominal aortic aneurysm.   OPERATION/PROCEDURE:  Abdominal aortogram with pelvic runoff  arteriography.   SURGEON:  Balinda Quails, M.D.   ACCESS:  Right common femoral artery, 5-French sheath.   CONTRAST:  105 mL Visipaque.   COMPLICATIONS:  None apparent.   CLINICAL NOTE:  [name] Brown is a 60 year old gentleman with a history of  atherosclerotic vascular disease.  Known abdominal aortic aneurysm.  Previous coronary bypass.   Enlarging abdominal aortic aneurysm followed in the office now measuring  5.36 cm by CT scan.  Brought the cath lab at this time for further  workup with arteriography and planned placement of an aortic stent.   PROCEDURE NOTE:  The patient brought to the cath lab in stable  condition.  Placed in the supine position.  Right groin prepped and  draped in sterile fashion.  Administered 50 mcg of fentanyl, 1 mg of  Versed intravenously.  Skin and subcutaneous tissue right groin  instilled 1% Xylocaine.   An 18-gauge needle introduced right common femoral artery.  A 0.035 J-  wire passed through the needle easily into the abdominal aorta under  fluoroscopy.  The needle removed, 5-French sheath advanced over  guidewire, dilator removed, sheath flushed with heparin saline solution.   A graduated pigtail catheter advanced over the guidewire to the mid  abdominal aorta.  Standard AP mid abdominal aortogram obtained.  This  revealed single widely patent bilateral renal arteries.  The infrarenal  abdominal aortic  aneurysm has a 2 cm neck.  Common iliac arteries normal  in size with moderate to mild plaque disease.   A lateral aortogram obtained.  Superior mesenteric and celiac access  patent.   A magnified view of the juxtarenal abdominal aorta obtained.  This  verified a 2-cm infrarenal aortic neck.  Widely patent renal arteries  bilaterally.   Pigtail catheter brought down to the aortic bifurcation.  Bilateral  oblique pelvic arteriograms obtained.  The common and external iliac  arteries patent bilaterally.  Hypogastric arteries patent bilaterally.   This completed the arteriogram procedure.  Guidewire reinserted and the  pigtail catheter removed. Retrograde right femoral arteriogram revealed  the sheath to be in the right common femoral artery.  However, there was  plaque disease in the right common femoral artery.  Therefore closure  device was not used.   The patient tolerated procedure well.  No apparent complications.  Transferred to the recovery room in stable condition.   FINAL IMPRESSION:  1. Single widely patent bilateral renal arteries.  2. Infrarenal abdominal aortic aneurysm 5.3 cm with 2 cm proximal  aortic neck.  3. Patent bilateral common iliac arteries.   DISPOSITION:  These results will be reviewed further with the patient  and family.  He does appear to be candidate for potential placement of  an aortic stent graft.      Balinda Quails, M.D.  Electronically Signed     PGH/MEDQ  D:  10/18/2007  T:  10/18/2007  Job:  [id number]   cc:   Jethro Bastos, M.D.

## 2011-02-23 NOTE — Procedures (Signed)
ENDOVASCULAR STENT GRAFT EXAM   INDICATION:  Follow up abdominal aortic aneurysm repair.   HISTORY:  Abdominal aortic aneurysm endograft, 12/01/07, by Dr. Madilyn Fireman.                           DUPLEX EVALUATION   AAA Sac Size:                 3.78 cm CM AP         4.56 cm CM TRV  Previous Sac Size:            06/29/08 (CT) 4.2 cm CM AP  5.3 cm CM TRV  Evidence of an endoleak?      No                    No   Velocity Criteria:  Proximal Aorta                71 cm/sec  Proximal Stent Graft          56 cm/sec  Main Body Stent Graft-Mid     61 cm/sec  Right Limb-Proximal           65 cm/sec  Right Limb-Distal             70 cm/sec  Left Limb-Proximal            89 cm/sec  Left Limb-Distal              101 cm/sec  Patent Renal Arteries?        Yes                   Yes   IMPRESSION:  1. Patent aortic endograft with no evidence of endoleak.  2. Residual abdominal aortic aneurysm sac size has decreased from      previous study.   ___________________________________________  P. Liliane Bade, M.D.   AS/MEDQ  D:  02/13/2009  T:  02/13/2009  Job:  [id number]

## 2011-02-23 NOTE — Assessment & Plan Note (Signed)
OFFICE VISIT   SELMER, ADDUCI  DOB:  [birth year]                                       08/24/2007  ZOXWR#:[id number]   Patient is a 60 year old gentleman with known atherosclerotic vascular  disease.  He underwent coronary artery bypass in 2001, carried out by  Dr. Cornelius Moras.  He has a known abdominal aortic aneurysm.  He was initially  evaluated in April of this year with a 5.2 cm abdominal aortic aneurysm.  Follow-up CT scan performed on Feb 23, 2007 revealed a 5 cm infrarenal  abdominal aortic aneurysm.  Mild iliac ectasia.   Patient returns at this time, having undergone ultrasound today with his  aneurysm measuring 5.36 cm in maximal diameter.   Patient has been free of any symptoms.  Denies any abdominal pain or  back pain.  No recent chest pain.  Continues to receive treatment for  hypertension and hyperlipidemia.  He is intolerant of STATINS.  No  longer smoking.   Patient appears generally well.  Alert and oriented.  No acute distress.  BP 137/82, pulse 50 per minute and regular.  Respirations 18 per minute,  O2 sat 100%.  Abdomen is soft and nontender.  He has no organomegaly.  AAA palpable.  No significant tenderness.  Normal bowel sounds without  bruits.  Femoral pulses 2+ bilaterally.   Patient has continued to show some enlargement of his aneurysm.  This  remains asymptomatic.  With continuing enlargement, however, I do think  it would be worthwhile to carry out further investigations for possible  stent graft placement.  He is quite busy at work at this time.  I am  comfortable delaying any further workup for a couple of months.  He is  scheduled to undergo an abdominal aortogram for workup of his aneurysm  on October 17, 2006 at [facility].   Balinda Quails, M.D.  Electronically Signed   PGH/MEDQ  D:  08/24/2007  T:  08/25/2007  Job:  [id number]

## 2011-02-23 NOTE — Op Note (Signed)
[name], [name] NO.:  [id number]   MEDICAL RECORD NO.:  [id number]          PATIENT TYPE:  INP   LOCATION:  3303                         FACILITY:  [facility]   PHYSICIAN:  Balinda Quails, M.D.    DATE OF BIRTH:  01-Jan-1951   DATE OF PROCEDURE:  12/01/2007  DATE OF DISCHARGE:                               OPERATIVE REPORT   SURGEON:  Dr. Liliane Bade.   ASSISTANT:  Dr. Waverly Ferrari.   PREOPERATIVE DIAGNOSIS:  A 5.2-cm abdominal aortic aneurysm.   POSTOPERATIVE DIAGNOSIS:  A 5.2-cm abdominal aortic aneurysm.   PROCEDURE:  Endovascular aneurysm repair with Endologics Powerlink graft  (25 x 16 x 140, 25 x 25 x 75).   CLINICAL NOTE:  [name] Brown is a 60 year old male with known coronary  disease and abdominal aortic aneurysm.  His aneurysm has been followed  in the office and now enlarged to greater than 5 cm.  He is quite  anxious to go ahead and have this fixed.  He is brought to the operating  at this time for placement of an endovascular aneurysm repair graft.   OPERATIVE PROCEDURE:  The patient was brought to the operating room in  stable condition.  Placed in supine position.  General endotracheal  anesthesia induced.  Foley catheter arterial line in place.  The abdomen  prepped and draped in a sterile fashion.   An oblique skin incision made in the right groin.  Dissection carried  down through subcutaneous tissue with electrocautery.  The inguinal  ligament identified.  The right common femoral artery mobilized at the  inguinal ligament and encircled with a vessel loop.  The tributaries  were controlled with fine vessel loops.  Distal dissection carried down,  and the distal common femoral artery encircled with a vessel loop.   Common femoral artery was large in caliber.  Was accessed with an 18-  gauge needle through the anterior wall.  A 0.035 Bentson guidewire  advanced through the needle.  A 12-French tearaway sheath was then  advanced  over the guidewire into the right iliac system.   The contralateral groin was accessed with a percutaneous 18-gauge needle  into the common femoral artery.  A 0.035 Bentson guidewire advanced  through the needle into the mid abdominal aorta.  A 9-French sheath  advanced over the guidewire into the left common femoral artery.  The  dilator removed.   The patient administered 7000 units of heparin intravenously.   Predilatation of the common iliac arteries was carried out with 8 x 4  Powerflex balloons in the bilateral common iliac arteries inflated to 10  atmospheres with a kissing balloon technique.   The balloons were then removed.  A snare sheath was then advanced over  the right femoral guidewire into the abdominal aorta.  The snare wire  then advanced through this, and the contralateral wire was grasped and  brought across the bifurcation and through the right femoral sheath.   A dual lumen catheter was then advanced from the left groin across the  bifurcation into the right femoral sheath.  The dual  lumen catheter was  then positioned with the second lumen pointing into the abdominal aorta.  A 0.035 Amplatz super stiff guidewire was then advanced through the  second lumen of the dual lumen catheter into the abdominal aorta.   The main Endologics bifurcated device, 25 x 16 x 140, was then loaded on  the Amplatz guidewire with the contralateral limb brought through the  dual lumen catheter to the contralateral groin.  The tearaway sheath  brought out of the right femoral artery and removed and the bifurcated  device advanced through the right iliac system into the abdominal aorta  just above the bifurcation.  The limbs were then released from the outer  sheath, and then the ipsilateral limb was resheathed and the entire  system brought down to the aortic bifurcation.  The body of the device  was then deployed with a pusher rod through the ipsilateral limb of the  graft using  standard technique.   The contralateral guidewire was then accessed with a 0.014 __________  guidewire through SurePass contralateral limb wire.  The contralateral  limb was then deployed into the left common iliac artery.   A Compi catheter was then advanced over the 014 guidewire in the  contralateral limb up to the suprarenal aorta.  The guidewire exchange  made for a Bentson guidewire.  An OmniFlush graduated catheter was then  advanced over the guidewire into the suprarenal aorta.  An abdominal  aortogram obtained.  This revealed the proximal margin of the Endograft  to be well below the origin of the renal arteries.  Therefore, a 25 x 25  x 75 cuff was used proximally.  The sheath was then removed from the  right groin and the proximal cuff advanced over the Amplatz Super Stiff  guidewire to the juxtarenal aorta.  Using the cuff aortogram technique,  the proximal cuff was deployed in immediate infrarenal position and down  into the main body of the device.   A 26-mm Coda balloon was then advanced over the Super Stiff guidewire in  the right groin up to the juxtarenal aorta where it was inflated, and it  was also inflated at the junction of the cuff and the main body of the  device.  This was brought down to the right common iliac artery, the  left common iliac artery was then protected with a 8 x 4 Powerflex  balloon, and using a kissing balloon inflation technique, the iliac  limbs were each dilated with the Coda and Powerflex balloons.  The  balloons were then removed.  The OmniFlush catheter reinserted and an  aortogram obtained outlining the graft.  There was a question of some  billowing proximally in the graft, and this was then retreated with the  Coda balloon at the junction of the proximal cuff and the main body  device.   A repeat angiogram obtained with an LAO projection, and this verified  this to be some billowing of the fabric and no evidence of type 1 leak.    This completed the stent procedure.  A retrograde left femoral  arteriogram was obtained.  This verified the left groin sheath to be in  the common femoral artery.  Therefore, a Starclose guidewire was  advanced through the sheath, the sheath removed, Starclose sheath  advanced, and the dilator removed.  The Starclose device advanced  through the sheath and deployed; however, there was a misfiring of this.  Therefore, the device was removed and pressure placed on the  left groin  for 15 minutes.   The sheath was removed from the right groin, and the right femoral  artery was antegrade and retrograde flushed.  Controlled with clamps.  The transverse arteriotomy closed with running 5-0 Prolene suture.  Clamps were then removed.  Excellent flow present.  Adequate hemostasis  obtained.  Sponge and instrument counts correct.   The right groin closed running 2-0 Vicryl suture in two subcutaneous  layers, skin closed with 4-0 Monocryl.  Dermabond applied.   The patient administered 50 mg of protamine intravenously.  There were  no apparent complications.  Total contrast load was 88 mL of Visipaque.  Fluoroscopy time 5.09 minutes.  The patient transferred to the recovery  room in stable condition.      Balinda Quails, M.D.  Electronically Signed     PGH/MEDQ  D:  12/01/2007  T:  12/02/2007  Job:  [id number]   cc:   Jethro Bastos, M.D.

## 2011-02-23 NOTE — Procedures (Signed)
VASCULAR LAB EXAM   INDICATION:  Followup abdominal aortic endograft.  It was placed  12/01/2007 by Dr. Balinda Quails.   HISTORY:  Diabetes:  no  Cardiac:  no  Hypertension:  yes   EXAM:  Abdominal aortic aneurysm sac size is 3.0 cm AP, transverse is  3.7.  Previous sac size AP was 3.8, transverse was 4.6.   IMPRESSION:  Aorta and endograft repair patent.  No evidence of endoleak  was detected.  The abdominal aortic aneurysm has decreased in size from  previous exams.  The aneurysmal sac surrounding the endograft has  decreased in size since previous  exam.   ___________________________________________  Di Kindle. Edilia Bo, M.D.   OD/MEDQ  D:  10/22/2010  T:  10/22/2010  Job:  [id number]

## 2011-02-23 NOTE — Assessment & Plan Note (Signed)
OFFICE VISIT   [name] Brown, [name] Brown  DOB:  05/18/1951                                       06/27/2008  ZOXWR#:[id number]   The patient is a 60 year old gentleman who underwent placement of a AAA  stent graft on 12/01/2007 at [facility].  This was an  uneventful procedure.  He continues to do well since then.  Underwent CT  scan prior to this visit which reveals good position of the AAA stent  without evidence of endoleak.  Shrinkage of the aneurysm down to 5.3 cm.   The patient has no complaints at this time.  BP is 135/81, pulse is 52  per minute.  Abdomen soft and nontender.  No masses or organomegaly.  2+  femoral pulses bilaterally.   The patient continues to do well following his aneurysm stent procedure.  We will plan followup again in 6 months with an ultrasound of the  abdomen.   Balinda Quails, M.D.  Electronically Signed   PGH/MEDQ  D:  06/27/2008  T:  06/29/2008  Job:  [id number]   cc:   Jethro Bastos, M.D.  Noralyn Pick. Eden Emms, MD, [facility]

## 2011-02-23 NOTE — H&P (Signed)
[name], [name] NO.:  [id number]   MEDICAL RECORD NO.:  [id number]          PATIENT TYPE:  INP   LOCATION:  3303                         FACILITY:  [facility]   PHYSICIAN:  Balinda Quails, M.D.    DATE OF BIRTH:  [birth year]   DATE OF ADMISSION:  12/01/2007  DATE OF DISCHARGE:                              HISTORY & PHYSICAL   PRIMARY CARE PHYSICIAN:  Jethro Bastos, M.D.   ADMISSION DIAGNOSIS:  Abdominal aortic aneurysm.   HISTORY OF PRESENT ILLNESS:  [name] Brown is a 60 year old gentleman  with a history of coronary disease.  He underwent coronary bypass in  2001.  He has a known abdominal aortic aneurysm.  This has been followed  with serial ultrasound at [facility].   His aneurysm has increased in size and most recently measured 5.2-cm in  maximal diameter.  Significant intraluminal thrombus is present.  The  aneurysm ends at the aortic bifurcation.  Proximal iliac arteries were  noted to be normal in caliber.   The patient has had no significant pain.   PAST MEDICAL HISTORY:  1. Coronary artery disease, status post coronary artery bypass in      2001.  2. Hypertension.  3. Hyperlipidemia.  4. Remote history of tobacco abuse.   MEDICATIONS:  1. Multivitamin 1 tablet q.a.m.  2. Aspirin 325 mg daily.  3. Altace 10 mg daily.  4. Hydrochlorothiazide 12.5 mg daily.  5. Lopressor 25 mg b.i.d.  6. Verapamil 120 mg t.i.d.  7. Doxycycline one p.o. b.i.d.  8. Glucosamine 500 mg p.o. b.i.d.  9. Zetia 10 mg p.o. q.a.m.  10.Niacin 1000 mg b.i.d.  11.Fish oil 1200 mg daily.   ALLERGIES:  No known drug allergies.   FAMILY HISTORY:  Mother died at age 75 with complications of an MI.  Father had seven MIs prior to his death at age 31.   SOCIAL HISTORY:  The patient is married.  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  Discontinued tobacco use in  1992.  He does not consume alcohol on a regular basis.   REVIEW OF SYSTEMS:  The patient has no major complaints at this time.  Specifically, no recent chest pain or shortness of breath.  No abdominal  pain or back pain.   PHYSICAL EXAMINATION:  GENERAL:  A 60 year old gentleman appears his  stated age.  Alert and oriented.  In no acute distress.  VITAL SIGNS:  BP 120/60, pulse 64 per minute, respirations 18 per  minute.  HEENT:  Mouth and throat clear.  Normocephalic.  No extraocular  movements intact.  NECK:  Supple.  No thyromegaly or adenopathy.  CARDIOVASCULAR:  Normal heart sounds without murmurs.  Regular rate and  rhythm.  No rubs or gallops.  CHEST:  Equal entry bilaterally without rales or rhonchi.  ABDOMEN:  Soft and nontender.  AAA palpable.  No organomegaly or other  masses felt.  EXTREMITIES:  2+ femoral pulses bilaterally.  Intact popliteal,  posterior tibial and dorsalis pedis pulses bilaterally.  No peripheral  edema.  NEUROLOGIC:  The patient is alert and oriented.  Cranial nerves intact.  Strength equal bilaterally.  Normal reflexes.   IMPRESSION:  1. A 5.2-cm, abdominal aortic aneurysm.  2. Coronary artery disease.  3. Hypertension.  4. Hyperlipidemia.  5. Remote history of tobacco abuse.   PLAN:  The patient will be admitted to the hospital on December 01, 2007, for planned endovascular aneurysm repair of AAA.      Balinda Quails, M.D.  Electronically Signed     PGH/MEDQ  D:  12/01/2007  T:  12/01/2007  Job:  [id number]   cc:   Jethro Bastos, M.D.

## 2011-02-23 NOTE — Assessment & Plan Note (Signed)
OFFICE VISIT   HANSFORD, HIRT  DOB:  04/30/1951                                       02/13/2009  YQMVH#:[id number]   The patient underwent placement of an endovascular stent graft for AAA  repair on 12/01/2007 at [facility].  He returns at this time  for scheduled surveillance followup.  An ultrasound was performed which  reveals his native aortic sac to have now shrunk to 4.5 cm from a  previous value of 5.3 cm by CT scan.  He has no evidence of endoleak.  Flow through the graft is patent.  Renal arteries are patent  bilaterally.  Ankle brachial indices measure 0.91 on the right, 1.0 on  the left.   The patient has no complaints at this time.  Denies leg pain.  No  abdominal pain.  Regular bowel habits.  Appetite is good.  No recent  chest pain or shortness of breath.   PHYSICAL EXAMINATION:  BP is 143/79, pulse is 37 per minute.  His  abdomen is soft, nontender.  No masses or organomegaly.  Normal bowel  sounds without bruits.  Intact femoral pulses bilaterally.   Overall the patient is doing well, I will plan to follow up with him  again in 6 months with a CT angiogram of the abdomen and pelvis.   Balinda Quails, M.D.  Electronically Signed   PGH/MEDQ  D:  02/13/2009  T:  02/14/2009  Job:  [id number]   cc:   Jethro Bastos, M.D.  Noralyn Pick. Eden Emms, MD, [facility]

## 2011-02-23 NOTE — Assessment & Plan Note (Signed)
[facility]                            CARDIOLOGY OFFICE NOTE   [name] Brown, [name] Brown                      MRN:          [id number]  DATE:09/14/2007                            DOB:          18-Apr-1951    [name] Brown is seen as a new patient to me.  He was previously a patient  of Dr. Gabriel Rung.  He has had a distant history of CABG in 2001 by Dr. Cornelius Moras.  He has a history of abdominal aortic aneurysm which measures about 5.2 x  5.5.  I believe he is going to have a stent graft procedure by Dr. Madilyn Fireman  in January.  In talking to the patient, he is active.  He works for a  [employer].  He is not having significant chest pain.  His last  Myoview was in September 2007 and was nonischemic with some thinning of  the inferior wall and EF of 51%.  I told him since he is active and on  good medical therapy with a nonischemic Myoview and is going to have a  percutaneous intervention, that he would be cleared for this without  further workup.   The patient does tend to run a low heart rate.  He said he has always  done this; however, he is taking Lopressor 25 mg b.i.d.  I cautioned him  about this and told him at some point in the future we may need to cut  back on this.   REVIEW OF SYSTEMS:  Negative.  In particular, he has not had any  significant abdominal pain, nausea or vomiting.  No palpitations, chest  pain, PND or orthopnea.   CURRENT MEDICATIONS:  1. Lopressor 25 mg b.i.d.  2. An aspirin a day.  3. Verapamil 360 mg a day.  4. Centrum.  5. Altace 10 mg a day.  6. Doxycycline.  7. Hydrochlorothiazide 12.5 mg a day.  8. Zetia 10 mg a day.  9. Niacin 1 g b.i.d.   EXAM:  Remarkable for a blood pressure of 120/70, pulse 49 and regular,  afebrile, respiratory rate 14.  HEENT:  Unremarkable.  Carotids normal without bruit.  No lymphadenopathy, thyromegaly or JVP  elevation.  Respiratory rate is 14, afebrile.  Lungs are clear, good diaphragmatic  motion.  No wheezing.  S1, S2, with normal heart sounds.  PMI normal, status post CABG.  He is  also status post left radial harvest.  He has some telangiectasia on his  chest.  ABDOMEN:  Protuberant.  His abdominal aorta is palpable but not tender.  There is no bruit.  Distal pulses are intact, no edema.   EKG shows sinus rhythm with bradycardia at a rate of 46 with left axis  deviation.   IMPRESSION:  1. Coronary disease, 2001, nonischemic Myoview in 2007, with coronary      artery bypass graft.  Continue aspirin and beta blocker therapy.      Cleared for stent graft procedure.  2. Relative bradycardia.  May need to cut back on Lopressor but since      he has vascular disease and  will undergo a procedure, we will      maintain this unless he becomes symptomatic.  3. Hypertension, currently well-controlled.  Continue current      medications.  At some point in time, particularly with his      bradycardia, we may need to switch his verapamil to Procardia.  4. Abdominal aortic aneurysm.  Follow up with Dr. Madilyn Fireman, probably to      plan stent graft therapy in January.  5. Hyperlipidemia.  I will have to go back and see if he has been      intolerant to statin before as it looks like both Crestor and      Vytorin have been stopped.  We will continue Zetia and Niaspan.   I will see the patient back in 6 months.     Noralyn Pick. Eden Emms, MD, [facility]  Electronically Signed    PCN/MedQ  DD: 09/14/2007  DT: 09/15/2007  Job #: [id number]

## 2011-02-23 NOTE — Op Note (Signed)
NAME:  [name] Brown, [name] Brown NO.:  [id number]   MEDICAL RECORD NO.:  [id number]          PATIENT TYPE:  INP   LOCATION:  3303                         FACILITY:  [facility]   PHYSICIAN:  Balinda Quails, M.D.    DATE OF BIRTH:  May 23, 1951   DATE OF PROCEDURE:  DATE OF DISCHARGE:                               OPERATIVE REPORT   THERE IS NO DICTATION FOR THIS JOB NUMBER.      Balinda Quails, M.D.     PGH/MEDQ  D:  12/01/2007  T:  12/01/2007  Job:  [id number]

## 2011-02-23 NOTE — Assessment & Plan Note (Signed)
OFFICE VISIT   [name] Brown, ALEGRIA  DOB:  04/15/1951                                       12/28/2007  EAVWU#:[id number]   Patient underwent an abdominal aortic aneurysm repair with an aortic  stent graft, carried out on 12/01/07.  He presents today for a  postoperative visit with a CT scan.   CT scan reveals his aneurysm to be thrombosed.  Stent graft in good  position without complicating features.   BP 125/73, pulse 50 per minute and regular.  Respirations 16 per minute.  O2 98%.  The right groin incision healing well.  The left groin reveals  no complications.  Femoral pulses are 2+ bilaterally.  Abdomen is soft  and nontender.   Patient is doing well following surgery.  No complicating features  evident.  We will plan followup again in six months with a CT scan.   Balinda Quails, M.D.  Electronically Signed   PGH/MEDQ  D:  12/28/2007  T:  12/29/2007  Job:  [id number]   cc:   Jethro Bastos, M.D.

## 2011-02-23 NOTE — Procedures (Signed)
DUPLEX ULTRASOUND OF ABDOMINAL AORTA   INDICATION:  Followup abdominal aortic aneurysm   HISTORY:  Diabetes:  No  Cardiac:  CAD, CABG x3  Hypertension:  Yes  Smoking:  No  Family History:  No  Previous Surgery:  No   DUPLEX EXAM:         AP (cm)                   TRANSVERSE (cm)  Proximal             1.74 cm                   1.84 cm  Mid                  4.73 cm                   5.36 cm  Distal               4.52 cm                   4.78 cm  Right Iliac          0.97 cm                   0.98 cm  Left Iliac           1.06 cm                   0.95 cm   PREVIOUS:  Date: 12/20/2006 (at [facility])  AP:  4.7  TRANSVERSE:  5.2   IMPRESSION:  1. Slight size increase in abdominal aortic aneurysm from the study at      [facility]  2. Mural thrombus was noted   ___________________________________________  P. Liliane Bade, M.D.   AS/MEDQ  D:  08/24/2007  T:  08/25/2007  Job:  [id number]

## 2011-02-26 NOTE — Op Note (Signed)
NAME:  [name] Brown, [name] Brown               ACCOUNT NO.:  [id number]   MEDICAL RECORD NO.:  [id number]          PATIENT TYPE:  AMB   LOCATION:  ENDO                         [facility]AC[facility]   PHYSICIAN:  John C. Madilyn Fireman, M.D.    DATE OF BIRTH:  [birth year]   DATE OF PROCEDURE:  09/18/2004  DATE OF DISCHARGE:                                 OPERATIVE REPORT   PROCEDURE:  Colonoscopy with polypectomy.   INDICATIONS FOR PROCEDURE:  Average risk colon cancer screening.   DESCRIPTION OF PROCEDURE:  The patient was placed in the left lateral  decubitus position then placed on the pulse monitor with continuous low flow  oxygen delivered by nasal cannula. He was sedated with 87.5 mcg IV fentanyl  and 10 mg IV Versed. The Olympus video colonoscope was inserted into the  rectum and advanced to the cecum, confirmed by transillumination at  McBurney's point and visualization at the ileocecal valve and appendiceal  orifice. The prep was good. The cecum and ascending colon appeared normal  with no masses, polyps, diverticula or other mucosal abnormalities.  Within  the transverse colon, there was a sessile 6-8 mm polyp that was removed by  hot biopsy. Similar sessile polyps were seen in the descending, sigmoid and  rectosigmoid respectively and also removed with the hot biopsy forceps.  No  other diverticula, polyps, masses or inflammatory changes were seen anywhere  else in the colon including the rectum.  The scope was then withdrawn and  the patient returned to the recovery room in stable condition. He tolerated  the procedure well and there were no immediate complications.   IMPRESSION:  Transverse descending, sigmoid and rectosigmoid colon polyps.   PLAN:  Await histology to determine method and interval for future colon  screening.      JCH/MEDQ  D:  09/18/2004  T:  09/18/2004  Job:  [id number]   cc:   Jethro Bastos, M.D.  [address]  [place]  [place] [address]  Fax: [phone]

## 2011-02-26 NOTE — Op Note (Signed)
[place]. [facility]  Patient:    [name] Brown, [name] Brown                      MRN: [id number] Proc. Date: 03/31/00 Adm. Date:  [id number] Disc. Date: [id number] Attending:  Tressie Stalker CC:         Cecil Cranker, M.D. [facility]             Bruce R. Juanda Chance, M.D. [facility]             Dr. Oneal Grout at [facility]             CVTS office                           Operative Report  PREOPERATIVE DIAGNOSIS:  Left main disease, three vessel coronary artery disease, positive stress Cardiolite examination.  POSTOPERATIVE DIAGNOSIS:  Left main disease, three vessel coronary artery disease, positive stress Cardiolite examination.  PROCEDURE:  Median sternotomy for coronary artery bypass grafting x 3 (left internal mammary artery to distal left anterior descending coronary artery, right internal mammary artery to posterior descending coronary artery, left radial artery to first circumflex marginal branch).  SURGEON:  Salvatore Decent. Cornelius Moras, M.D.  ASSISTANT:  Areta Haber, P.A.  ANESTHESIA:  General.  BRIEF CLINICAL NOTE:  The patient is a 60 year old white male from [place], followed by Dr. Oneal Grout at [facility], and referred by E. Graceann Congress, M.D. and Everardo Beals. Juanda Chance, M.D. for management of coronary artery disease.  The patient has a long history of coronary artery disease, status post acute myocardial infarction in 1986.  He was treated with angioplasty of the left anterior descending coronary artery at that time.  he also has hypercholesterolemia as well as a strong family history of coronary artery disease.  He recently developed atypical symptoms of chest pain which prompted a follow up stress Cardiolite exam under the care and direction of Dr. Corinda Gubler.  This demonstrated changes consistent with coronary ischemia in the anterior wall of the left ventricle.  The patient subsequently underwent elective cardiac catheterization.   This demonstrates severe three vessel coronary artery disease as well as left main coronary disease with normal left ventricular function.  The patient is referred for elective surgical intervention.  OPERATIVE CONSENT:  The patient and his wife are counseled at length regarding the indications and potential benefits of coronary artery bypass grafting. They understand the associated risks of surgery including, but not limited to risks of death, stroke, myocardial infarction, bleeding requiring blood transfusion, arrhythmia, infection, and recurrent coronary artery disease. They also understand the rationale for use of arterial conduits such as bilateral internal mammary artery graft and radial artery conduits.  They accept all associated risks of these procedures as well including the perhaps slight increased risk of sternal wound complications, as well as risks for neurovascular compromise to the left forearm and hand.  They desire to proceed with surgery as described.  DESCRIPTION OF PROCEDURE:  The patient was brought to the operating room on the above mentioned date, and invasive hemodynamic monitoring was established by the anesthesia service under the care and direction of Bedelia Person, M.D.  The patient was placed in the supine position on the operating table.  Of note, prior to induction and following induction, the patient was noted to have sinus bradycardia with occasional episodes of junctional rhythm.  This is presumed related to relative  high dose beta blocker therapy preoperatively. The patient tolerated endotracheal intubation and induction of anesthesia without any difficulty whatsoever.  The patients chest, abdomen, left upper extremity, both groins, and both lower extremities are prepared and draped in a sterile manner.  The left radial artery is harvested to be used as a conduit for bypass grafting through a longitudinal incision along the volar aspect of the  left forearm.  Sharp dissection is used entirely with avoiding use of any electrocautery during the dissection.  Care was taken to identify and avoid the superficial thenar branch of the left radial nerve during the dissection. All branches of the artery and its associated veins are divided between Hemoclips.  Prior to division of the artery, the artery is briefly occluded proximally and a strong pulse can be appreciated distally.  The artery is therefore transected at the level of the wrist and flushed with heparinized saline solution.  The arterial graft is inspected for hemostasis.  The proximal end is then transected.  Both proximal and distal ends of the artery are oversewn with suture ligatures.  The arterial conduit is placed in a room temperature bath of pH balanced solution containing heparin and low dose papaverine.  The wound is irrigated with saline solution and inspected for hemostasis.  The incision along the left forearm is closed in multiple layers with absorbable suture.  The skin is closed with a subcuticular skin closure.  A median sternotomy incision is performed and the left internal mammary artery is dissected from the chest wall and prepared for bypass grafting. Subsequently, the right internal mammary artery is dissected from the chest wall and also prepared for bypass grafting.  Both mammary arteries and the left radial artery are all thought to be excellent quality conduits for bypass grafting.  The patient is heparinized systemically.  The pericardium is opened.  The ascending aorta is inspected and is felt to be normal.  There are no palpable plaques or calcifications.  The ascending aorta and the right atrial appendage are cannulated for cardiopulmonary bypass. Adequate heparinization is verified.  Cardiopulmonary bypass is begun and the surface of the heart is inspected. Distal sites are selected for coronary bypass grafting.  Both internal mammary arteries  and the left radial artery are trimmed to appropriate lengths.  A  temperature probe is placed in the left ventricular septum and a styrofoam pad is placed to protect the left phrenic nerve from thermal injury.  A cardioplegia catheter is placed in the ascending aorta.  The patient is cooled to 30 degrees systemic temperature.  The aortic cross-clamp is applied and cardioplegia is delivered in an antegrade fashion through the aortic root.  Additional doses of cardioplegia are administered through the aortic root intermittently during the cross-clamp portion of the operation to maintain septal temperature below 15 degrees centigrade.  The following distal coronary anastomoses are performed:  #1 - The first circumflex marginal branch is grafted with the left radial artery in an end-to-side fashion using running 8-0 Prolene suture.  This coronary measures 2.0 mm in diameter and is of excellent quality.  There is 70% proximal stenosis at the level of the left main coronary artery.  #2 - The posterior descending coronary artery is grafted with the right internal mammary artery as an in situ graft using running 8-0 Prolene suture in an end-to-side fashion.  This anastomosis is placed immediately distal to the bifurcation of the right coronary artery.  There is atherosclerotic plaque at the level of the bifurcation  of the right coronary artery, but the proximal posterior descending coronary artery is normal.  It measures 1.8 mm in diameter.  A probe was passed retrograde up the distal right coronary artery for a distance of several centimeters.  #3 - The distal left anterior descending coronary artery is grafted with the left internal mammary artery using a running 8-0 Prolene suture.  This coronary measures 2.2 mm in diameter and is of excellent quality at the site of distal bypass.  There is 90% proximal stenosis.  The proximal right coronary artery is totally occluded.  The single proximal  anastomosis for the left radial artery is performed directly to the ascending aorta prior to removal of the aortic cross-clamp.  The patient was placed in the Trendelenburg position and all air is evacuated from the aortic root.  The septal temperature is noted to rise rapidly and dramatically upon reperfusion of the internal mammary arteries.  The aortic cross-clamp is removed after a total cross-clamp time of 60 minutes.  The patients heart began to beat spontaneously without need for cardioversion.  All proximal and distal anastomoses are inspected for hemostasis and appropriate graft orientation.  Epicardial pacing wires were affixed to the right ventricular outflow tract and to the right atrial appendage.  The patient was rewarmed to greater than 37 degrees centigrade temperature.  The patient was weaned from cardiopulmonary bypass without difficulty.  The patients rhythm at separation from bypass is a junctional rhythm requiring A-V sequential pacing.  No inotropic support was required.  Total cardiopulmonary bypass time for the operation is 93 minutes.  The venous and arterial cannulae are removed uneventfully.  Protamine was administered to reverse the anticoagulation.  The mediastinum and both left and right pleural spaces are irrigated with saline solution containing vancomycin.  Meticulous surgical hemostasis was ascertained.  The mediastinum and both pleural spaces are drained with four chest tubes placed through separate stab incisions inferiorly.  The median sternotomy was closed in a routine fashion.  The skin incision is closed with a subcuticular skin closure.  The patient tolerated the procedure well and is transported to the surgical intensive care unit in stable condition.  There were no intraoperative complications.  All sponge, instrument, and needle counts were verified correct at completion of the operation.  No autologous blood products  were administered. DD:  03/31/00 TD:  04/03/00 Job: [id number]

## 2011-02-26 NOTE — Discharge Summary (Signed)
[place]  Patient:    [name] Brown, [name] Brown                      MRN: [id number] Adm. Date:  [id number] Disc. Date: [id number] Attending:  Tressie Stalker Dictator:   Eugenia Pancoast, P.A. CC:         Salvatore Decent. Cornelius Moras, M.D.             Jethro Bastos, M.D.             Teena Irani. Arlyce Dice, M.D.             Cecil Cranker, M.D. [facility]                           Discharge Summary  DATE OF BIRTH:  1951/08/22  FINAL DIAGNOSES: 1. Coronary artery disease. 2. Hypertension. 3. Hyperlipidemia.  PROCEDURES: 1. April 01, 2000:  Left heart catheterization by Dr. Charlies Constable. 2. March 31, 2000:  Coronary artery bypass x 3 with a LIMA to the LAD, RIMA to    the right posterior descending artery, and left radial artery to the first    obtuse marginal.  Surgeon was Dr. Tressie Stalker.  HISTORY:  This is a 60 year old gentleman with a long history of coronary artery disease status post a myocardial infarction in 1986.  He had a PTCA in 1986.  He has had recent episodes of atypical chest pains in the last two months.  It has prompted him to undergo followup Cardiolite, which showed positive ischemic changes.  Because of these, he was scheduled to undergo elective catheterization at this time for further evaluation.  HOSPITAL COURSE:  The patient was admitted to [facility] March 30, 2000.  At that time, he underwent the left heart catheterization per Dr. Charlies Constable, which showed significant three-vessel disease.  There was a 70% left main stenosis with an 80% LAD stenosis.  The circumflex artery was completely occluded.  The right coronary artery was completely occluded proximally as well.  Because of these findings and the patients symptoms, he was referred to Dr. Cornelius Moras for surgical revascularization and on March 31, 2000, the following day, he underwent coronary artery bypass x 3 with a LIMA to the LAD, a right internal mammary to the right posterior  descending artery, and a left radial artery used for the first obtuse marginal bypass.  The patient tolerated the procedure well.  No intraoperative complications occurred. Postoperatively, the patient did satisfactorily.  He was extubated on the operative day.  He was hemodynamically stable at that time.  He continued to do well.  He was afebrile.  Vital signs were stable.  Thus, he was transferred to the step-down unit on the first postoperative day.  There, he continued to progress in a satisfactory manner.  He did have a noted episode of paroxysmal atrial fibrillation, which occurred on the morning of April 03, 2000.  He also had a very brief junctional rhythm and then after that he went back to sinus rhythm and he remained in sinus rhythm.  These symptoms did not affect the patient.  He was asymptomatic.  Subsequently, he did well over the next 24 hours and he was prepared for discharge.  DISCHARGE MEDICATIONS: 1. Lipitor 40 mg q.d. 2. Altace 10 mg q.d. 3. Verapamil 120 mg q.8h. 4. Lasix 20 mg q.d. for 10 days. 5. Potassium 20 mEq q.d.  for 10 days. 6. Lopressor 50 mg 1/2 tablet q.12h. 7. Enteric-coated aspirin 325 mg q.d. 8. Ultram 50 mg 1-2 p.o. q.4-6h. p.r.n. pain.  LABORATORY DATA:  His final lab work showed WBC 13.2, hemoglobin 10.4, hematocrit 28.3, platelets 119,000.  Sodium 136, potassium 4.0, chloride 109, CO2 27, BUN 12, creatinine 0.7, glucose 131, calcium 8.6.  The patient did quite well.  His incisions were all healing satisfactorily. No sign of infection was noted.  He was ambulating without difficulty, tolerating his diet well with no problems.  Subsequently, he was prepared for discharge on April 04, 2000.  FOLLOW-UP:  He will follow up with Dr. Graceann Congress in approximately two weeks after discharge.  He will see Dr. Tressie Stalker in three weeks from discharge.  CONDITION ON DISCHARGE:  The patient was subsequently discharged home in satisfactory and stable  condition on April 04, 2000. DD:  04/03/00 TD:  04/04/00 Job: [id number]

## 2011-02-26 NOTE — Letter (Signed)
June 08, 2006     Jethro Bastos, MD  [address],  [address]  [place], [place] [address]   RE:  OSMOND, STECKMAN  MRN:  [id number]  /  DOB:  1950/11/01   Dear Nadine Counts:   It was a pleasure to see your nice patient, [name] Brown, for follow-up on  June 08, 2006.  He is, as you know, a very pleasant 60 year old white  married male with a long history of coronary artery disease, hypertension,  hyperlipidemia, a former tobacco user.  He is 6 years post CABG by Salvatore Decent. Cornelius Moras, MD.  Also has an abdominal aneurysm most recently measured at 4.2 x  4.4 cm.  That was in November 2006.  The patient had no cardiac symptoms.  He states these have been fairly well-controlled; however, the patient  states he has discontinued his Lipitor 3 about weeks ago because of constant  pain in his left shoulder and arm, which he is convinced is related to the  statin and he states that he has tried other statins as well.  He is on  Zetia 10 mg, niacin 1 g b.i.d., hydrochlorothiazide 12.5 mg daily,  doxycycline, Altace 10 mg, verapamil 360 mg, aspirin, Lopressor 25 mg b.i.d.  Most recent echo revealed normal EF, 55% EF on the October 28, 2003,  adenosine Cardiolite.   PHYSICAL EXAMINATION:  VITAL SIGNS:  Blood pressure 132/80, pulse 49, sinus  bradycardia.  GENERAL APPEARANCE:  Normal.  NECK:  JVP is not elevated.  Carotid pulses are palpable and equal without  bruits.  LUNGS:  Clear.  CARDIAC:  Unremarkable.  ABDOMEN:  Normal.  EXTREMITIES:  Normal.   Recent renal profile was normal.  LFTs were normal.  Lipids revealed LDL now  up to 118 with total 165 three weeks after discontinuing the statin.   His EKG reveals sinus bradycardia and left axis deviation.   DIAGNOSES:  As above.   PLAN:  Will plan to follow up the abdominal ultrasound.  We will continue  off the statin.  I have suggested possibly to try the CoQ enzyme 10;  however, he is reluctant to do this and would rather remain off  the statin.   Thank you for the opportunity to share this nice gentleman's care.  I will  see him back in 4 months or at any other time you so desire.    Sincerely,      E. Graceann Congress, MD, [facility]   EJL/MedQ  DD:  06/08/2006  DT:  06/09/2006  Job #:  [id number]

## 2011-03-22 ENCOUNTER — Other Ambulatory Visit: Payer: Self-pay | Admitting: Cardiovascular Disease

## 2011-06-17 ENCOUNTER — Ambulatory Visit (INDEPENDENT_AMBULATORY_CARE_PROVIDER_SITE_OTHER): Payer: 59 | Admitting: Cardiovascular Disease

## 2011-06-17 DIAGNOSIS — I2510 Atherosclerotic heart disease of native coronary artery without angina pectoris: Secondary | ICD-10-CM

## 2011-06-17 DIAGNOSIS — I498 Other specified cardiac arrhythmias: Secondary | ICD-10-CM

## 2011-06-17 DIAGNOSIS — E785 Hyperlipidemia, unspecified: Secondary | ICD-10-CM

## 2011-06-17 DIAGNOSIS — I10 Essential (primary) hypertension: Secondary | ICD-10-CM

## 2011-06-17 NOTE — Progress Notes (Signed)
[name] Brown is seen today for F/U of elevated lipids, HTN, CABG in 2001, AAA with endograf 2009. 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 Compliant with meds. Denies SSCP, dyspnea, palpitations, abdominal pain edema. Some bradycardia in past with beta blockers and severe myalgias with statins. Las LDL this month 117. Reviewed records and he has had severe myalias with all statins and elevated CPK's. Called Dr Adele Dan office and reviewed records. Had endograf 2009 for 5.3 cm aneurysm. [place] 10/22/10 No endoleak and residual aneurysm 3 3.7 cm decreased in size. Disucssed bradycardia with him.  Lopresser decreased to daily in March and HR still in the low 50's, high 40;s with no symptoms  Reviewed his home BP/HR readings . Will stop lopresser  Totally  Wants to stay on verapamil as this helps with cluster headaches. No symptomatic bradycardia. Still employed at [employer] in [place]. Stress level much better. Abstaining from drugs and alchohol.  ROS: Denies fever, malais, weight loss, blurry vision, decreased visual acuity, cough, sputum, SOB, hemoptysis, pleuritic pain, palpitaitons, heartburn, abdominal pain, melena, lower extremity edema, claudication, or rash.  All other systems reviewed and negative  General: Affect appropriate Healthy:  appears stated age HEENT: normal Neck supple with no adenopathy JVP normal no bruits no thyromegaly Lungs clear with no wheezing and good diaphragmatic motion Heart:  S1/S2 no murmur,rub, gallop or click PMI normal Abdomen: benighn, BS positve, no tenderness, no AAA no bruit.  No HSM or HJR Distal pulses intact with no bruits No edema Neuro non-focal Skin warm and dry No muscular weakness   Current Outpatient Prescriptions  Medication Sig Dispense Refill  . aspirin 325 MG tablet Take 325 mg by mouth daily.        Marland Kitchen doxycycline (VIBRAMYCIN) 100 MG capsule Take 100 mg by mouth. 1/4 tab 2 times daily       . fish oil-omega-3 fatty acids  1000 MG capsule Take 1 g by mouth 2 (two) times daily.        . Glucosamine-Chondroitin (GLUCOSAMINE CHONDR COMPLEX PO) 500 mg 2 times daily       . hydrochlorothiazide (,MICROZIDE/HYDRODIURIL,) 12.5 MG capsule TAKE 1 CAPSULE DAILY  90 capsule  1  . metoprolol (LOPRESSOR) 50 MG tablet Take 1 tablet (50 mg total) by mouth daily. 1/2 tab po once daily      . Multiple Vitamins-Minerals (CENTRUM PO) 1 tab po qd       . niacin (NIASPAN) 1000 MG CR tablet Take 1,000 mg by mouth 2 (two) times daily.        . ramipril (ALTACE) 10 MG capsule TAKE ONE CAPSULE EVERY DAY  30 capsule  11  . verapamil (CALAN) 120 MG tablet TAKE 1 TABLET BY MOUTH 3 TIMES A DAY  90 tablet  10  . ZETIA 10 MG tablet TAKE 1 TABLET DAILY  90 tablet  1    Allergies  Statins  Electrocardiogram:  Assessment and Plan

## 2011-06-17 NOTE — Assessment & Plan Note (Signed)
D/C lopresser and follow

## 2011-06-17 NOTE — Patient Instructions (Signed)
Your physician recommends that you schedule a follow-up appointment in: 6 MONTHS WITH DR [facility]  Your physician has recommended you make the following change in your medication: STOP LOPRESSOR

## 2011-06-17 NOTE — Assessment & Plan Note (Signed)
Cholesterol is at goal.  Continue current dose of statin and diet Rx.  No myalgias or side effects.  F/U  LFT's in 6 months. Lab Results  Component Value Date   LDLCALC 116* 01/01/2011

## 2011-06-17 NOTE — Assessment & Plan Note (Signed)
Stable with no angina and good activity level.  Continue medical Rx  

## 2011-06-17 NOTE — Assessment & Plan Note (Signed)
Well controlled.  Continue current medications and low sodium Dash type diet.    

## 2011-06-18 ENCOUNTER — Encounter: Payer: Self-pay | Admitting: Cardiovascular Disease

## 2011-06-30 LAB — COMPREHENSIVE METABOLIC PANEL
ALT: 19
Alkaline Phosphatase: 56
BUN: 16
CO2: 27
Calcium: 10.1
GFR calc non Af Amer: >60
Glucose, Bld: 80
Potassium: 4.7
Sodium: 138

## 2011-06-30 LAB — PROTIME-INR
INR: 1
Prothrombin Time: 13.8

## 2011-06-30 LAB — CBC
HCT: 45.8
Hemoglobin: 15.6
MCHC: 34
RBC: 4.71
RDW: 13.5

## 2011-07-02 LAB — COMPREHENSIVE METABOLIC PANEL
AST: 29
Albumin: 4.2
BUN: 14
Chloride: 100
Creatinine, Ser: 0.74
GFR calc Af Amer: >60
Potassium: 3.9
Total Protein: 7

## 2011-07-02 LAB — BASIC METABOLIC PANEL
BUN: 11
CO2: 24
Calcium: 9.1
Creatinine, Ser: 0.74
Glucose, Bld: 143 — ABNORMAL HIGH

## 2011-07-02 LAB — CBC
HCT: 41.3
Hemoglobin: 12.5 — ABNORMAL LOW
MCHC: 33.8
MCV: 97
Platelets: 203
Platelets: 237
RDW: 14
RDW: 14.1
WBC: 8.3

## 2011-07-02 LAB — BLOOD GAS, ARTERIAL
Acid-Base Excess: 1.2
Acid-base deficit: 25.9 — ABNORMAL HIGH
Bicarbonate: 25.9 — ABNORMAL HIGH
O2 Saturation: 96.8
pO2, Arterial: 89.2

## 2011-07-02 LAB — TYPE AND SCREEN
ABO/RH(D): A POS
Antibody Screen: NEGATIVE

## 2011-07-02 LAB — APTT: aPTT: 27

## 2011-07-02 LAB — URINALYSIS, ROUTINE W REFLEX MICROSCOPIC
Nitrite: NEGATIVE
Specific Gravity, Urine: 1.01
Urobilinogen, UA: 0.2
pH: 6.5

## 2011-07-02 LAB — PROTIME-INR: INR: 1

## 2011-09-20 ENCOUNTER — Other Ambulatory Visit: Payer: Self-pay | Admitting: Cardiovascular Disease

## 2011-12-07 ENCOUNTER — Telehealth: Payer: Self-pay | Admitting: Cardiovascular Disease

## 2011-12-07 DIAGNOSIS — E7800 Pure hypercholesterolemia, unspecified: Secondary | ICD-10-CM

## 2011-12-07 DIAGNOSIS — Z79899 Other long term (current) drug therapy: Secondary | ICD-10-CM

## 2011-12-07 NOTE — Telephone Encounter (Signed)
Pt calling re appt on 12-16-11 with nishan, pt requesting blood work order be put in computer so he can go to [facility] prior to appt

## 2011-12-07 NOTE — Telephone Encounter (Signed)
Patient would like to have labs work prior appointment with Dr. Eden Emms on 12/16/11. Patient  States he always have labs done prior appointment. Labs order: BMET, Fasting Lipids and LFT on  [facility] labs for Friday 12/10/11.  Patient aware.

## 2011-12-10 ENCOUNTER — Other Ambulatory Visit (INDEPENDENT_AMBULATORY_CARE_PROVIDER_SITE_OTHER): Payer: 59

## 2011-12-10 ENCOUNTER — Other Ambulatory Visit: Payer: 59

## 2011-12-10 DIAGNOSIS — Z79899 Other long term (current) drug therapy: Secondary | ICD-10-CM

## 2011-12-10 DIAGNOSIS — E7800 Pure hypercholesterolemia, unspecified: Secondary | ICD-10-CM

## 2011-12-10 LAB — HEPATIC FUNCTION PANEL
ALT: 23 U/L (ref 0–53)
Albumin: 4.2 g/dL (ref 3.5–5.2)
Alkaline Phosphatase: 60 U/L (ref 39–117)
Bilirubin, Direct: 0.1 mg/dL (ref 0.0–0.3)
Total Protein: 7.1 g/dL (ref 6.0–8.3)

## 2011-12-10 LAB — LIPID PANEL
Cholesterol: 181 mg/dL (ref 0–200)
Triglycerides: 54 mg/dL (ref 0.0–149.0)
VLDL: 10.8 mg/dL (ref 0.0–40.0)

## 2011-12-10 LAB — BASIC METABOLIC PANEL
BUN: 20 mg/dL (ref 6–23)
GFR: 84.92 mL/min (ref >60.00)
Potassium: 4.4 mEq/L (ref 3.5–5.1)

## 2011-12-14 ENCOUNTER — Other Ambulatory Visit: Payer: Self-pay | Admitting: Cardiovascular Disease

## 2011-12-16 ENCOUNTER — Encounter: Payer: Self-pay | Admitting: Cardiovascular Disease

## 2011-12-16 ENCOUNTER — Ambulatory Visit (INDEPENDENT_AMBULATORY_CARE_PROVIDER_SITE_OTHER): Payer: 59 | Admitting: Cardiovascular Disease

## 2011-12-16 DIAGNOSIS — I498 Other specified cardiac arrhythmias: Secondary | ICD-10-CM

## 2011-12-16 DIAGNOSIS — I719 Aortic aneurysm of unspecified site, without rupture: Secondary | ICD-10-CM

## 2011-12-16 DIAGNOSIS — I2510 Atherosclerotic heart disease of native coronary artery without angina pectoris: Secondary | ICD-10-CM

## 2011-12-16 DIAGNOSIS — I10 Essential (primary) hypertension: Secondary | ICD-10-CM

## 2011-12-16 DIAGNOSIS — E785 Hyperlipidemia, unspecified: Secondary | ICD-10-CM

## 2011-12-16 NOTE — Assessment & Plan Note (Signed)
Beta blocker stopped.  Still with conduction disease but asymptomatic.  Wants to be on verapamil for cluster headaches.  Suspect he will need pacer in future

## 2011-12-16 NOTE — Assessment & Plan Note (Signed)
Will work on diet.  Suggested red yeast rice.  Does not want to be seen in lipid clinic  Intolerant to statins

## 2011-12-16 NOTE — Progress Notes (Signed)
Patient ID: [name] Brown, male   DOB: 03/13/1951, 61 y.o.   MRN: [id number] Dmitriy is seen today for F/U of elevated lipids, HTN, CABG in 2001, AAA with endograf 2009. 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 Compliant with meds. Denies SSCP, dyspnea, palpitations, abdominal pain edema. Some bradycardia in past with beta blockers and severe myalgias with statins. Las LDL this month 117. Reviewed records and he has had severe myalias with all statins and elevated CPK's. Called Dr Adele Dan office and reviewed records. Had endograf 2009 for 5.3 cm aneurysm. [place] 10/22/10 No endoleak and residual aneurysm 3 3.7 cm decreased in size. Disucssed bradycardia with him. Lopresser decreased to daily in March and HR still in the low 50's, high 40;s with no symptoms Reviewed his home BP/HR readings . Will stop lopresser Totally Wants to stay on verapamil as this helps with cluster headaches. No symptomatic bradycardia. Still employed at [employer] in [place]. Stress level much better. Abstaining from drugs and alchohol.   ROS: Denies fever, malais, weight loss, blurry vision, decreased visual acuity, cough, sputum, SOB, hemoptysis, pleuritic pain, palpitaitons, heartburn, abdominal pain, melena, lower extremity edema, claudication, or rash.  All other systems reviewed and negative  General: Affect appropriate Healthy:  appears stated age HEENT: normal Neck supple with no adenopathy JVP normal no bruits no thyromegaly Lungs clear with no wheezing and good diaphragmatic motion Heart:  S1/S2 no murmur, no rub, gallop or click PMI normal Abdomen: benighn, BS positve, no tenderness, no AAA no bruit.  No HSM or HJR Distal pulses intact with no bruits No edema Neuro non-focal Skin warm and dry No muscular weakness   Current Outpatient Prescriptions  Medication Sig Dispense Refill  . aspirin 325 MG tablet Take 325 mg by mouth daily.        Marland Kitchen doxycycline (VIBRAMYCIN) 100 MG capsule  Take 100 mg by mouth. 1/4 tab 2 times daily       . fish oil-omega-3 fatty acids 1000 MG capsule Take 1 g by mouth 2 (two) times daily.        . Glucosamine-Chondroitin (GLUCOSAMINE CHONDR COMPLEX PO) 500 mg 2 times daily       . hydrochlorothiazide (MICROZIDE) 12.5 MG capsule TAKE 1 CAPSULE DAILY  90 capsule  3  . Multiple Vitamins-Minerals (CENTRUM PO) 1 tab po qd       . niacin (NIASPAN) 1000 MG CR tablet Take 1,000 mg by mouth 2 (two) times daily.        . ramipril (ALTACE) 10 MG capsule TAKE ONE CAPSULE EVERY DAY  30 capsule  11  . verapamil (CALAN) 120 MG tablet TAKE 1 TABLET BY MOUTH 3 TIMES A DAY  90 tablet  10  . ZETIA 10 MG tablet TAKE 1 TABLET DAILY  90 tablet  3    Allergies  Statins  Electrocardiogram:  SR rate 50  PR 220 LAFB  Assessment and Plan

## 2011-12-16 NOTE — Assessment & Plan Note (Signed)
No endoleak and stable size by duplex 1/12  F/U Dr Edilia Bo

## 2011-12-16 NOTE — Assessment & Plan Note (Signed)
Reviewed home BP readings and systolics still labile.  Limited on drug choices due to bradycardia.  F/U 3 months.  Suspect changing ACE/ARB with be next step

## 2011-12-16 NOTE — Patient Instructions (Signed)
Your physician recommends that you schedule a follow-up appointment in: 3 months with dr Eden Emms Your physician recommends that you continue on your current medications as directed. Please refer to the Current Medication list given to you today.

## 2011-12-16 NOTE — Assessment & Plan Note (Signed)
Stable with no angina and good activity level.  Continue medical Rx  

## 2011-12-29 ENCOUNTER — Other Ambulatory Visit: Payer: Self-pay | Admitting: *Deleted

## 2011-12-29 DIAGNOSIS — I714 Abdominal aortic aneurysm, without rupture: Secondary | ICD-10-CM

## 2012-01-04 ENCOUNTER — Encounter: Payer: Self-pay | Admitting: Neurosurgery

## 2012-01-05 ENCOUNTER — Encounter: Payer: Self-pay | Admitting: Neurosurgery

## 2012-01-05 ENCOUNTER — Ambulatory Visit (INDEPENDENT_AMBULATORY_CARE_PROVIDER_SITE_OTHER): Payer: 59 | Admitting: *Deleted

## 2012-01-05 ENCOUNTER — Ambulatory Visit: Payer: 59 | Admitting: Vascular Surgery

## 2012-01-05 ENCOUNTER — Ambulatory Visit (INDEPENDENT_AMBULATORY_CARE_PROVIDER_SITE_OTHER): Payer: 59 | Admitting: Neurosurgery

## 2012-01-05 VITALS — BP 142/91 | HR 55 | Resp 20 | Ht 70.0 in | Wt 191.0 lb

## 2012-01-05 DIAGNOSIS — I714 Abdominal aortic aneurysm, without rupture: Secondary | ICD-10-CM

## 2012-01-05 DIAGNOSIS — Z48812 Encounter for surgical aftercare following surgery on the circulatory system: Secondary | ICD-10-CM

## 2012-01-05 NOTE — Progress Notes (Signed)
Subjective:     Patient ID: [name] Brown, male   DOB: 1951/03/04, 61 y.o.   MRN: [id number]  HPI: This is a 61 year old male patient followed by Dr. Edilia Bo, this patient is now 4 years status post AAA aneurysm stent graft by Dr. Madilyn Fireman. The patient reports no new problems, no abdominal pain. Previous sac size was 3.0 and continues to shrink.   Review of Systems: Denies chest pain, back pain, abdominal pain. No arrhythmias, no nonhealing ulcers, rest pain or claudication. 12 point review of systems is notable for the difficulties described above otherwise unremarkable.     Objective:   Physical Exam: Blood pressure is 142/91 pulse 55 respirations 20, 1+ bilateral PT and DP pulses, no carotid bruits bilaterally abdomen is soft nondistended no AAA palpable. Heart is regular rate and rhythm lungs are clear auscultation, neurologically he  is intact no focal deficits.     Assessment:     61-year-old male patient for your status post AAA stent by Dr. Madilyn Fireman doing well no difficulties. Vascular exam today shows AAA sac size of 2.82 previously was 3.0 January 2012.    Plan:     I discussed this patient with Dr. Edilia Bo prior to the patient's arrival. His followup with [place] will be when necessary the patient is in agreement with this, his questions were encouraged and answered  Lauree Chandler ANP  Clinic M.D.: Dr. Edilia Bo

## 2012-01-06 ENCOUNTER — Other Ambulatory Visit: Payer: Self-pay | Admitting: Cardiovascular Disease

## 2012-01-10 ENCOUNTER — Other Ambulatory Visit: Payer: Self-pay | Admitting: Cardiovascular Disease

## 2012-01-19 NOTE — Procedures (Unsigned)
VASCULAR LAB EXAM  INDICATION:  Followup AAA endograft placed 12/01/2007.  HISTORY: Diabetes:  No Cardiac:  No Hypertension:  Yes  EXAM:  AAA sac size 2.82 cm AP, 3.28 cm transverse.  Previous sac size 10/22/2010 was 3.0 cm AP, 3.7 cm transverse.  IMPRESSION: 1. The aorta and endograft appear patent. 2. No significant change in size of the aneurysmal sac surrounding the     endograft. 3. No evidence of endoleak was detected, although there was overlying     bowel gas in the proximal to midsegment, preventing complete     visualization.  ___________________________________________ Di Kindle. Edilia Bo, M.D.  EM/MEDQ  D:  01/05/2012  T:  01/05/2012  Job:  [id number]

## 2012-03-07 ENCOUNTER — Telehealth: Payer: Self-pay | Admitting: Cardiovascular Disease

## 2012-03-07 DIAGNOSIS — E785 Hyperlipidemia, unspecified: Secondary | ICD-10-CM

## 2012-03-07 NOTE — Telephone Encounter (Signed)
New msg Pt wants appt with [facility] for this Friday, Please let him know

## 2012-03-07 NOTE — Telephone Encounter (Signed)
Pt calling to schedule fasting lipid/liver for this Friday at the [place] office prior to appt. With Dr. Eden Emms. Appt given Orders entered. Mylo Red RN

## 2012-03-10 ENCOUNTER — Other Ambulatory Visit: Payer: 59

## 2012-03-10 ENCOUNTER — Other Ambulatory Visit (INDEPENDENT_AMBULATORY_CARE_PROVIDER_SITE_OTHER): Payer: 59

## 2012-03-10 DIAGNOSIS — E785 Hyperlipidemia, unspecified: Secondary | ICD-10-CM

## 2012-03-10 LAB — HEPATIC FUNCTION PANEL
AST: 26 U/L (ref 0–37)
Alkaline Phosphatase: 61 U/L (ref 39–117)
Total Bilirubin: 1.3 mg/dL — ABNORMAL HIGH (ref 0.3–1.2)

## 2012-03-10 LAB — LIPID PANEL
Total CHOL/HDL Ratio: 4
Triglycerides: 51 mg/dL (ref 0.0–149.0)

## 2012-03-14 ENCOUNTER — Ambulatory Visit (INDEPENDENT_AMBULATORY_CARE_PROVIDER_SITE_OTHER): Payer: 59 | Admitting: Cardiovascular Disease

## 2012-03-14 ENCOUNTER — Encounter: Payer: Self-pay | Admitting: Cardiovascular Disease

## 2012-03-14 VITALS — BP 152/88 | HR 59 | Ht 70.0 in | Wt 189.4 lb

## 2012-03-14 DIAGNOSIS — I10 Essential (primary) hypertension: Secondary | ICD-10-CM

## 2012-03-14 DIAGNOSIS — I719 Aortic aneurysm of unspecified site, without rupture: Secondary | ICD-10-CM

## 2012-03-14 DIAGNOSIS — R0989 Other specified symptoms and signs involving the circulatory and respiratory systems: Secondary | ICD-10-CM

## 2012-03-14 DIAGNOSIS — E785 Hyperlipidemia, unspecified: Secondary | ICD-10-CM

## 2012-03-14 DIAGNOSIS — I2510 Atherosclerotic heart disease of native coronary artery without angina pectoris: Secondary | ICD-10-CM

## 2012-03-14 MED ORDER — VERAPAMIL HCL 120 MG PO TABS: 120.0000 mg | ORAL_TABLET | Freq: Two times a day (BID) | ORAL | Status: DC

## 2012-03-14 MED ORDER — RAMIPRIL 10 MG PO CAPS: 10.0000 mg | ORAL_CAPSULE | Freq: Two times a day (BID) | ORAL | Status: DC

## 2012-03-14 NOTE — Assessment & Plan Note (Signed)
Limited by bradycardia  Beta blocker stopped.  Increase Altace to 10 bid and continue verapamil

## 2012-03-14 NOTE — Assessment & Plan Note (Signed)
S/P endograft repair  F/U Dr Edilia Bo.

## 2012-03-14 NOTE — Assessment & Plan Note (Signed)
Stable with no angina and good activity level.  Continue medical Rx  

## 2012-03-14 NOTE — Assessment & Plan Note (Signed)
Continue diet and Zetia  LDL 130  Accelerate trial if patient reconsiders

## 2012-03-14 NOTE — Assessment & Plan Note (Signed)
New finding F/U carotid duplex given known vascular disease 

## 2012-03-14 NOTE — Patient Instructions (Signed)
Your physician wants you to follow-up in: 6 months with dr Haywood Filler will receive a reminder letter in the mail two months in advance. If you don't receive a letter, please call our office to schedule the follow-up appointment. .Your physician has recommended you make the following change in your medication: INCREASE ALTACE TO 10 MG TWICE DAILY Your physician has requested that you have a carotid duplex. This test is an ultrasound of the carotid arteries in your neck. It looks at blood flow through these arteries that supply the brain with blood. Allow one hour for this exam. There are no restrictions or special instructions. DX BRUIT

## 2012-03-14 NOTE — Progress Notes (Signed)
Patient ID: [name] Brown, male   DOB: 1951-09-05, 61 y.o.   MRN: [id number] [name] Brown is seen today for F/U of elevated lipids, HTN, CABG in 2001, AAA with endograf 2009. 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 Compliant with meds. Denies SSCP, dyspnea, palpitations, abdominal pain edema. Some bradycardia in past with beta blockers and severe myalgias with statins. Las LDL this month 117. Reviewed records and he has had severe myalias with all statins and elevated CPK's. Called Dr Adele Dan office and reviewed records. Had endograf 2009 for 5.3 cm aneurysm. [place] 10/22/10 No endoleak and residual aneurysm 3 3.7 cm decreased in size. Disucssed bradycardia with him. Lopresser decreased to daily in March and HR still in the low 50's, high 40;s with no symptoms Reviewed his home BP/HR readings . Still running a bit high.  He will take Altace bid g oing forward.  Did not tolerate red yeast rice.  Allergic to statins.  Does not want to be enrolled in accelerate and does not want Niaspan    ROS: Denies fever, malais, weight loss, blurry vision, decreased visual acuity, cough, sputum, SOB, hemoptysis, pleuritic pain, palpitaitons, heartburn, abdominal pain, melena, lower extremity edema, claudication, or rash.  All other systems reviewed and negative General: Affect appropriate Healthy:  appears stated age HEENT: normal Neck supple with no adenopathy JVP normal left  bruits no thyromegaly Lungs clear with no wheezing and good diaphragmatic motion Heart:  S1/S2 SEM murmur, no rub, gallop or click PMI normal Abdomen: benighn, BS positve, no tenderness, no AAA no bruit.  No HSM or HJR Distal pulses intact with no bruits No edema Neuro non-focal Skin warm and dry No muscular weakness   Current Outpatient Prescriptions  Medication Sig Dispense Refill  . aspirin 325 MG tablet Take 325 mg by mouth daily.        Marland Kitchen doxycycline (VIBRAMYCIN) 100 MG capsule Take 100 mg by mouth. 1/4 tab 2 times  daily       . fish oil-omega-3 fatty acids 1000 MG capsule Take 1 g by mouth 2 (two) times daily.        . Glucosamine-Chondroitin (GLUCOSAMINE CHONDR COMPLEX PO) 500 mg 2 times daily       . hydrochlorothiazide (MICROZIDE) 12.5 MG capsule TAKE 1 CAPSULE DAILY  90 capsule  3  . Multiple Vitamins-Minerals (CENTRUM PO) 1 tab po qd       . niacin (NIASPAN) 1000 MG CR tablet Take 1,000 mg by mouth 2 (two) times daily.        . ramipril (ALTACE) 10 MG capsule TAKE ONE CAPSULE EVERY DAY  30 capsule  11  . ZETIA 10 MG tablet TAKE 1 TABLET DAILY  90 tablet  3  . verapamil (CALAN) 120 MG tablet Take 1 tablet (120 mg total) by mouth 2 (two) times daily.  90 tablet  10  . DISCONTD: metoprolol (LOPRESSOR) 50 MG tablet Take 1 tablet (50 mg total) by mouth daily. 1/2 tab po once daily        Allergies  Statins  Electrocardiogram:  Assessment and Plan

## 2012-04-07 ENCOUNTER — Encounter (INDEPENDENT_AMBULATORY_CARE_PROVIDER_SITE_OTHER): Payer: 59

## 2012-04-07 DIAGNOSIS — R0989 Other specified symptoms and signs involving the circulatory and respiratory systems: Secondary | ICD-10-CM

## 2012-04-07 DIAGNOSIS — I6529 Occlusion and stenosis of unspecified carotid artery: Secondary | ICD-10-CM

## 2012-08-30 ENCOUNTER — Telehealth: Payer: Self-pay | Admitting: Cardiovascular Disease

## 2012-08-30 DIAGNOSIS — E785 Hyperlipidemia, unspecified: Secondary | ICD-10-CM

## 2012-08-30 DIAGNOSIS — Z79899 Other long term (current) drug therapy: Secondary | ICD-10-CM

## 2012-08-30 NOTE — Telephone Encounter (Signed)
PT WANTING LAB ORDERS ENTERED   IS SEEING DR NISHAN IN COUPLE OF WEEKS  BMET, LIPID,AND LIVER ENTERED IN [facility] FOR [facility] OFFICE .PER PT'S REQUEST /CY

## 2012-08-30 NOTE — Telephone Encounter (Signed)
plz return call to pt regarding lab work [phone]

## 2012-09-06 ENCOUNTER — Other Ambulatory Visit (INDEPENDENT_AMBULATORY_CARE_PROVIDER_SITE_OTHER): Payer: 59

## 2012-09-06 DIAGNOSIS — Z79899 Other long term (current) drug therapy: Secondary | ICD-10-CM

## 2012-09-06 DIAGNOSIS — E785 Hyperlipidemia, unspecified: Secondary | ICD-10-CM

## 2012-09-06 LAB — HEPATIC FUNCTION PANEL
Albumin: 4.2 g/dL (ref 3.5–5.2)
Total Protein: 7.3 g/dL (ref 6.0–8.3)

## 2012-09-06 LAB — BASIC METABOLIC PANEL
CO2: 29 mEq/L (ref 19–32)
Calcium: 10 mg/dL (ref 8.4–10.5)
Creatinine, Ser: 1 mg/dL (ref 0.4–1.5)
Glucose, Bld: 109 mg/dL — ABNORMAL HIGH (ref 70–99)

## 2012-09-06 LAB — LIPID PANEL
Cholesterol: 185 mg/dL (ref 0–200)
HDL: 40.7 mg/dL (ref >39.00)
Triglycerides: 67 mg/dL (ref 0.0–149.0)

## 2012-09-12 ENCOUNTER — Encounter: Payer: Self-pay | Admitting: Cardiovascular Disease

## 2012-09-12 ENCOUNTER — Ambulatory Visit (INDEPENDENT_AMBULATORY_CARE_PROVIDER_SITE_OTHER): Payer: 59 | Admitting: Cardiovascular Disease

## 2012-09-12 VITALS — BP 146/73 | HR 58 | Ht 70.0 in | Wt 191.0 lb

## 2012-09-12 DIAGNOSIS — I10 Essential (primary) hypertension: Secondary | ICD-10-CM

## 2012-09-12 DIAGNOSIS — I2510 Atherosclerotic heart disease of native coronary artery without angina pectoris: Secondary | ICD-10-CM

## 2012-09-12 DIAGNOSIS — R0989 Other specified symptoms and signs involving the circulatory and respiratory systems: Secondary | ICD-10-CM

## 2012-09-12 DIAGNOSIS — E785 Hyperlipidemia, unspecified: Secondary | ICD-10-CM

## 2012-09-12 DIAGNOSIS — I719 Aortic aneurysm of unspecified site, without rupture: Secondary | ICD-10-CM

## 2012-09-12 NOTE — Assessment & Plan Note (Signed)
No palpable mass  F/U Dr Edilia Bo  Residual aneurysm 3.7 post stent graft

## 2012-09-12 NOTE — Assessment & Plan Note (Signed)
A low fat, low cholesterol is discussed with the patient, and a written copy is given to him.

## 2012-09-12 NOTE — Assessment & Plan Note (Signed)
Stable with no angina and good activity level.  Continue medical Rx  

## 2012-09-12 NOTE — Assessment & Plan Note (Signed)
60-70% LICA  F/U duplex 6/14

## 2012-09-12 NOTE — Progress Notes (Signed)
Patient ID: [name] Brown, male   DOB: 02-Sep-1951, 61 y.o.   MRN: [id number] Rolondo is seen today for F/U of elevated lipids, HTN, CABG in 2001, AAA with endograf 2009. 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 Compliant with meds. Denies SSCP, dyspnea, palpitations, abdominal pain edema. Some bradycardia in past with beta blockers and severe myalgias with statins. Las LDL this month 117. Reviewed records and he has had severe myalias with all statins and elevated CPK's. Called Dr Adele Dan office and reviewed records. Had endograf 2009 for 5.3 cm aneurysm. [place] 10/22/10 No endoleak and residual aneurysm 3 3.7 cm decreased in size. Bradycardia improved and BP better with altace  ROS: Denies fever, malais, weight loss, blurry vision, decreased visual acuity, cough, sputum, SOB, hemoptysis, pleuritic pain, palpitaitons, heartburn, abdominal pain, melena, lower extremity edema, claudication, or rash.  All other systems reviewed and negative  General: Affect appropriate Healthy:  appears stated age HEENT: normal poor dentition Neck supple with no adenopathy JVP normal left  bruits no thyromegaly Lungs clear with no wheezing and good diaphragmatic motion Heart:  S1/S2 no murmur, no rub, gallop or click PMI normal Abdomen: benighn, BS positve, no tenderness, no AAA no bruit.  No HSM or HJR Distal pulses intact with no bruits No edema Neuro non-focal Skin warm and dry No muscular weakness   Current Outpatient Prescriptions  Medication Sig Dispense Refill  . aspirin 325 MG tablet Take 325 mg by mouth daily.        Marland Kitchen doxycycline (VIBRAMYCIN) 100 MG capsule Take 100 mg by mouth. 1/4 tab 2 times daily       . fish oil-omega-3 fatty acids 1000 MG capsule Take 1 g by mouth 2 (two) times daily.        . Glucosamine-Chondroitin (GLUCOSAMINE CHONDR COMPLEX PO) 500 mg 2 times daily       . hydrochlorothiazide (MICROZIDE) 12.5 MG capsule TAKE 1 CAPSULE DAILY  90 capsule  3  . Multiple  Vitamins-Minerals (CENTRUM PO) 1 tab po qd       . niacin (NIASPAN) 1000 MG CR tablet Take 1,000 mg by mouth 2 (two) times daily.        . ramipril (ALTACE) 10 MG capsule Take 1 capsule (10 mg total) by mouth 2 (two) times daily.  60 capsule  11  . verapamil (CALAN) 120 MG tablet Take 1 tablet (120 mg total) by mouth 2 (two) times daily.  90 tablet  10  . ZETIA 10 MG tablet TAKE 1 TABLET DAILY  90 tablet  3  . [DISCONTINUED] metoprolol (LOPRESSOR) 50 MG tablet Take 1 tablet (50 mg total) by mouth daily. 1/2 tab po once daily        Allergies  Statins  Electrocardiogram:  Assessment and Plan

## 2012-09-12 NOTE — Patient Instructions (Signed)
Your physician wants you to follow-up in: YEAR  WITH DR Haywood Filler will receive a reminder letter in the mail two months in advance. If you don't receive a letter, please call our office to schedule the follow-up appointment. Your physician recommends that you continue on your current medications as directed. Please refer to the Current Medication list given to you today.  Your physician has requested that you have a carotid duplex. This test is an ultrasound of the carotid arteries in your neck. It looks at blood flow through these arteries that supply the brain with blood. Allow one hour for this exam. There are no restrictions or special instructions. DUE IN June 2014

## 2012-09-12 NOTE — Assessment & Plan Note (Signed)
Well controlled.  Continue current medications and low sodium Dash type diet.    

## 2012-12-20 ENCOUNTER — Telehealth: Payer: Self-pay

## 2012-12-20 ENCOUNTER — Other Ambulatory Visit: Payer: Self-pay

## 2012-12-20 MED ORDER — EZETIMIBE 10 MG PO TABS: 10.0000 mg | ORAL_TABLET | Freq: Every day | ORAL | Status: DC

## 2012-12-20 MED ORDER — HYDROCHLOROTHIAZIDE 12.5 MG PO CAPS: 12.5000 mg | ORAL_CAPSULE | Freq: Every day | ORAL | Status: DC

## 2012-12-20 NOTE — Telephone Encounter (Signed)
Returning call from Mr. Ruark's wife and he stated his wife takes care of his medications and to leave her a message and she will return phone call. Left message on her cell phone.

## 2012-12-20 NOTE — Telephone Encounter (Signed)
Wife returned phone call. Verified with [name] Brown CMA that Dr. Eden Emms refills both medications and sent in refills for patient. Since pt completely out sent in refill to local pharmacy and refill to [facility]

## 2013-01-04 ENCOUNTER — Emergency Department (HOSPITAL_COMMUNITY)
Admission: EM | Admit: 2013-01-04 | Discharge: 2013-01-04 | Disposition: A | Discharge status: Home / Self Care | Payer: 59 | Attending: Emergency Medicine | Admitting: Emergency Medicine

## 2013-01-04 DIAGNOSIS — Z79899 Other long term (current) drug therapy: Secondary | ICD-10-CM | POA: Insufficient documentation

## 2013-01-04 DIAGNOSIS — Z951 Presence of aortocoronary bypass graft: Secondary | ICD-10-CM | POA: Insufficient documentation

## 2013-01-04 DIAGNOSIS — I2510 Atherosclerotic heart disease of native coronary artery without angina pectoris: Secondary | ICD-10-CM | POA: Insufficient documentation

## 2013-01-04 DIAGNOSIS — S61219A Laceration without foreign body of unspecified finger without damage to nail, initial encounter: Secondary | ICD-10-CM

## 2013-01-04 DIAGNOSIS — S61209A Unspecified open wound of unspecified finger without damage to nail, initial encounter: Secondary | ICD-10-CM | POA: Insufficient documentation

## 2013-01-04 DIAGNOSIS — Z8679 Personal history of other diseases of the circulatory system: Secondary | ICD-10-CM | POA: Insufficient documentation

## 2013-01-04 DIAGNOSIS — E785 Hyperlipidemia, unspecified: Secondary | ICD-10-CM | POA: Insufficient documentation

## 2013-01-04 DIAGNOSIS — Z7982 Long term (current) use of aspirin: Secondary | ICD-10-CM | POA: Insufficient documentation

## 2013-01-04 DIAGNOSIS — Z87891 Personal history of nicotine dependence: Secondary | ICD-10-CM | POA: Insufficient documentation

## 2013-01-04 DIAGNOSIS — I10 Essential (primary) hypertension: Secondary | ICD-10-CM | POA: Insufficient documentation

## 2013-01-04 DIAGNOSIS — Y929 Unspecified place or not applicable: Secondary | ICD-10-CM | POA: Insufficient documentation

## 2013-01-04 DIAGNOSIS — Y9389 Activity, other specified: Secondary | ICD-10-CM | POA: Insufficient documentation

## 2013-01-04 DIAGNOSIS — W2203XA Walked into furniture, initial encounter: Secondary | ICD-10-CM | POA: Insufficient documentation

## 2013-01-04 NOTE — ED Notes (Signed)
Wound cleaned with normal saline and gauze. Bacitracin and band-aid applied to wound. Pt verbalized understanding of wound care.

## 2013-01-04 NOTE — ED Notes (Signed)
Pt c/o laceration to R index finger. Pt accidentally cut finger on a knife. Pt states he is on blood thinners. Bleeding controlled at this time. Pt states he is up to date on his tetanus status.

## 2013-01-04 NOTE — ED Provider Notes (Signed)
History    This chart was scribed for non-physician practitioner working with Ward Givens, MD by Leone Payor, ED Scribe. This patient was seen in room WTR9/WTR9 and the patient's care was started at 2040.   CSN: [id number]  Arrival date & time 01/04/13  2040   First MD Initiated Contact with Patient 01/04/13 2042      Chief Complaint  Patient presents with  . Extremity Laceration     The history is provided by the patient. No language interpreter was used.    [name] Brown is a 62 y.o. male who presents to the Emergency Department complaining of a new  Laceration to right index finger from changing a blade on a utility knife. He states the blade was clean and his tetanus is UTD. Pt states he immediately put his finger under water and put a bandage on it to control the bleeding. He takes blood thinners (aspirin) regularly. Pt states he took some tylenol for pain. Pt does not believe there to be any foreign bodies in the wound. The onset of this condition was acute. The course is constant. Aggravating factors: none. Alleviating factors: none.     Pt has h/o HTN.  Pt is a former smoker but denies alcohol use. Past Medical History  Diagnosis Date  . Bradycardia   . Hyperlipidemia   . HTN (hypertension)   . CAD (coronary artery disease) CABG 03/2000    Median sternotomy for coronary artery bypass grafting x 3 (left  . Aortic aneurysm Endograf 2009    Past Surgical History  Procedure Laterality Date  . Coronary artery bypass graft      AAA stent graft on 12/01/2007 at [facility]  coronary bypass in 2001. Dr. Cornelius Moras.    Family History  Problem Relation Age of Onset  . Heart attack Mother   . Heart disease Mother   . Heart attack Father   . Heart disease Father     History  Substance Use Topics  . Smoking status: Former Smoker -- 2.00 packs/day for 30 years    Types: Cigarettes    Quit date: 01/05/1992  . Smokeless tobacco: Never Used     Comment: quit  19 yrs ago  . Alcohol Use: No      Review of Systems  Skin: Positive for wound (laceration).    Allergies  Statins  Home Medications   Current Outpatient Rx  Name  Route  Sig  Dispense  Refill  . aspirin 325 MG tablet   Oral   Take 325 mg by mouth daily.           Marland Kitchen doxycycline (VIBRAMYCIN) 100 MG capsule   Oral   Take 100 mg by mouth. 1/4 tab 2 times daily          . ezetimibe (ZETIA) 10 MG tablet   Oral   Take 1 tablet (10 mg total) by mouth daily.   30 tablet   0   . ezetimibe (ZETIA) 10 MG tablet   Oral   Take 1 tablet (10 mg total) by mouth daily.   90 tablet   1   . fish oil-omega-3 fatty acids 1000 MG capsule   Oral   Take 1 g by mouth 2 (two) times daily.           . Glucosamine-Chondroitin (GLUCOSAMINE CHONDR COMPLEX PO)      500 mg 2 times daily          .  hydrochlorothiazide (MICROZIDE) 12.5 MG capsule   Oral   Take 1 capsule (12.5 mg total) by mouth daily.   30 capsule   0   . hydrochlorothiazide (MICROZIDE) 12.5 MG capsule   Oral   Take 1 capsule (12.5 mg total) by mouth daily.   90 capsule   1   . Multiple Vitamins-Minerals (CENTRUM PO)      1 tab po qd          . niacin (NIASPAN) 1000 MG CR tablet   Oral   Take 1,000 mg by mouth 2 (two) times daily.           . ramipril (ALTACE) 10 MG capsule   Oral   Take 1 capsule (10 mg total) by mouth 2 (two) times daily.   60 capsule   11   . verapamil (CALAN) 120 MG tablet   Oral   Take 1 tablet (120 mg total) by mouth 2 (two) times daily.   90 tablet   10     BP 159/74  Pulse 50  Temp(Src) 97.9 F (36.6 C) (Oral)  Resp 16  SpO2 99%  Physical Exam  Nursing note and vitals reviewed. Constitutional: He appears well-developed and well-nourished. No distress.  HENT:  Head: Normocephalic and atraumatic.  Eyes: EOM are normal.  Neck: Neck supple. No tracheal deviation present.  Cardiovascular: Normal rate.   Pulmonary/Chest: Effort normal. No respiratory  distress.  Musculoskeletal: Normal range of motion.  Neurological: He is alert.  Skin: Skin is warm and dry.  1 cm laceration to the index finger on right hand.  Psychiatric: He has a normal mood and affect. His behavior is normal.    ED Course  Procedures (including critical care time)  DIAGNOSTIC STUDIES: Oxygen Saturation is 99% on room air, normal by my interpretation.    COORDINATION OF CARE: 8:56 PM Discussed treatment plan with pt at bedside and pt agreed to plan.    Labs Reviewed - No data to display No results found.   1. Laceration of finger, initial encounter    Patient seen and examined.    Vital signs reviewed and are as follows: Filed Vitals:   01/04/13 2050  BP: 159/74  Pulse: 50  Temp: 97.9 F (36.6 C)  Resp: 16   Finger tourniquet applied. Tourniquet time approximately 15 minutes.  Wound was explored. It was approximately 1/4 inch in depth. No bone was felt with probing.   LACERATION REPAIR Performed by: Carolee Rota Authorized by: Carolee Rota Consent: Verbal consent obtained. Risks and benefits: risks, benefits and alternatives were discussed Consent given by: patient Patient identity confirmed: provided demographic data Prepped and Draped in normal sterile fashion Wound explored  Laceration Location: R 2nd digit  Laceration Length: 1 cm  No Foreign Bodies seen or palpated  Anesthesia: local infiltration  Local anesthetic: lidocaine 2% w/o epinephrine  Anesthetic total: 3 ml  Irrigation method: syringe with 20g angiocath Amount of cleaning: standard  Skin closure: 5-0 Ethilon  Number of sutures: 3  Technique: simple interrupted  Patient tolerance: Patient tolerated the procedure well with no immediate complications.  Patient counseled on wound care. Patient counseled on need to return or see PCP/urgent care for suture removal in 7 days. Patient was urged to return to the Emergency Department urgently with worsening pain,  swelling, expanding erythema especially if it streaks away from the affected area, fever, or if they have any other concerns. Patient verbalized understanding.     MDM  Finger laceration.  No bony or tendon damage suspected. Wound fully cleaned and explored. Tetanus is up-to-date. Wound repaired without complication.   I personally performed the services described in this documentation, which was scribed in my presence. The recorded information has been reviewed and is accurate.   Renne Crigler, PA-C 01/04/13 2345

## 2013-01-07 NOTE — ED Provider Notes (Signed)
Medical screening examination/treatment/procedure(s) were performed by non-physician practitioner and as supervising physician I was immediately available for consultation/collaboration. Devoria Albe, MD, FACEP   Ward Givens, MD 01/07/13 [phone]

## 2013-01-24 ENCOUNTER — Ambulatory Visit: Payer: 59 | Admitting: Neurosurgery

## 2013-02-16 ENCOUNTER — Other Ambulatory Visit: Payer: Self-pay | Admitting: Cardiology

## 2013-02-16 MED ORDER — VERAPAMIL HCL 120 MG PO TABS: 120.0000 mg | ORAL_TABLET | Freq: Two times a day (BID) | ORAL | Status: DC

## 2013-02-22 ENCOUNTER — Telehealth: Payer: Self-pay | Admitting: Cardiovascular Disease

## 2013-02-22 MED ORDER — VERAPAMIL HCL 120 MG PO TABS: 120.0000 mg | ORAL_TABLET | Freq: Two times a day (BID) | ORAL | Status: DC

## 2013-02-22 NOTE — Telephone Encounter (Signed)
New problem   Pt need to talk to you concerning his prescription for Verapamil he will run out today. Please call pt wife stated refill department won't refill it for her.

## 2013-02-22 NOTE — Telephone Encounter (Signed)
1 MONTH SUPPLY SENT TO [facility]  FOR PT'S VERAPAMIL  APPEARS  REFILL  MAY HAVE BEEN SENT TO [facility] .Zack Seal

## 2013-04-04 ENCOUNTER — Other Ambulatory Visit: Payer: Self-pay | Admitting: *Deleted

## 2013-04-04 MED ORDER — RAMIPRIL 10 MG PO CAPS: 10.0000 mg | ORAL_CAPSULE | Freq: Two times a day (BID) | ORAL | Status: DC

## 2013-04-04 MED ORDER — VERAPAMIL HCL 120 MG PO TABS: 120.0000 mg | ORAL_TABLET | Freq: Two times a day (BID) | ORAL | Status: DC

## 2013-04-09 ENCOUNTER — Encounter (INDEPENDENT_AMBULATORY_CARE_PROVIDER_SITE_OTHER): Payer: 59

## 2013-04-09 DIAGNOSIS — I6529 Occlusion and stenosis of unspecified carotid artery: Secondary | ICD-10-CM

## 2013-04-25 ENCOUNTER — Other Ambulatory Visit: Payer: Self-pay | Admitting: *Deleted

## 2013-04-25 DIAGNOSIS — I714 Abdominal aortic aneurysm, without rupture: Secondary | ICD-10-CM

## 2013-04-25 DIAGNOSIS — I739 Peripheral vascular disease, unspecified: Secondary | ICD-10-CM

## 2013-04-25 DIAGNOSIS — Z48812 Encounter for surgical aftercare following surgery on the circulatory system: Secondary | ICD-10-CM

## 2013-05-15 ENCOUNTER — Encounter: Payer: Self-pay | Admitting: Vascular Surgery

## 2013-05-16 ENCOUNTER — Encounter (INDEPENDENT_AMBULATORY_CARE_PROVIDER_SITE_OTHER): Payer: 59 | Admitting: *Deleted

## 2013-05-16 ENCOUNTER — Encounter: Payer: Self-pay | Admitting: Vascular Surgery

## 2013-05-16 ENCOUNTER — Ambulatory Visit (INDEPENDENT_AMBULATORY_CARE_PROVIDER_SITE_OTHER): Payer: 59 | Admitting: Vascular Surgery

## 2013-05-16 VITALS — BP 152/72 | HR 42 | Ht 70.0 in | Wt 182.0 lb

## 2013-05-16 DIAGNOSIS — Z48812 Encounter for surgical aftercare following surgery on the circulatory system: Secondary | ICD-10-CM

## 2013-05-16 DIAGNOSIS — I714 Abdominal aortic aneurysm, without rupture: Secondary | ICD-10-CM

## 2013-05-16 NOTE — Assessment & Plan Note (Signed)
This patient underwent Ivar in 2009. This was for a 5.3 cm aneurysm. The aneurysm has shrunk down to 3.2 cm and remained stable in size. This reason I think it is safe to continue his follow up in 18 months. I have ordered a follow duplex scan in 18 months and we will see him back at that time. He is not a smoker. He is on aspirin. He remains fairly active and swims. He knows to call sooner if he has problems.

## 2013-05-16 NOTE — Progress Notes (Signed)
[facility] [place]  Patient name: [name] Brown MRN: [id number] DOB: [birth year] Sex: male  REASON FOR VISIT: follow up after EVAR  HPI: HUDSON MAJKOWSKI is a 62 y.o. male who underwent EVAR in February of 2009 for a 5.3 cm infrarenal abdominal aortic aneurysm. He was last seen in March of 2013 when the aneurysm was noted to be 3.2 cm in maximum diameter. He comes in for an 18 month follow up visit. He denies abdominal pain or back pain. He remains fairly active and swims in his endless pool. He is not a smoker.  Past Medical History  Diagnosis Date  . Bradycardia   . Hyperlipidemia   . HTN (hypertension)   . CAD (coronary artery disease) CABG 03/2000    Median sternotomy for coronary artery bypass grafting x 3 (left  . Aortic aneurysm Endograf 2009   Family History  Problem Relation Age of Onset  . Heart attack Mother   . Heart disease Mother   . Heart attack Father   . Heart disease Father    SOCIAL HISTORY: History  Substance Use Topics  . Smoking status: Former Smoker -- 2.00 packs/day for 30 years    Types: Cigarettes    Quit date: 01/05/1992  . Smokeless tobacco: Never Used     Comment: quit 19 yrs ago  . Alcohol Use: No   Allergies  Allergen Reactions  . Statins     Myalgias with elevated CPKs   REVIEW OF SYSTEMS: Arly.Keller ] denotes positive finding; [  ] denotes negative finding  CARDIOVASCULAR:  [ ]  chest pain   [ ]  chest pressure   [ ]  palpitations   [ ]  orthopnea   [ ]  dyspnea on exertion   [ ]  claudication   [ ]  rest pain    PULMONARY:   [ ]  productive cough   [ ]  asthma   [ ]  wheezing NEUROLOGIC:   [ ]  weakness  [ ]  paresthesias  [ ]  aphasia  [ ]  amaurosis  [ ]  dizziness MUSCULOSKELETAL:  [ ]  joint pain   [ ]  joint swelling [ ]  leg swelling INTEGUMENTARY:  [ ]  rashes  [ ]  ulcers CONSTITUTIONAL:  [ ]  fever   [ ]  chills  PHYSICAL EXAM: Filed Vitals:   05/16/13 0901  BP: 152/72  Pulse: 42  Height: 5\' 10"  (1.778 m)  Weight: 182 lb (82.555  kg)  SpO2: 100%   Body mass index is 26.11 kg/(m^2). GENERAL: The patient is a well-nourished male, in no acute distress. The vital signs are documented above. CARDIOVASCULAR: There is a regular rate and rhythm. I do not detect carotid bruits. He has palpable femoral pulses. He has no significant lower extremity swelling. PULMONARY: There is good air exchange bilaterally without wheezing or rales. ABDOMEN: Soft and non-tender with normal pitched bowel sounds.  MUSCULOSKELETAL: There are no major deformities or cyanosis. NEUROLOGIC: No focal weakness or paresthesias are detected. SKIN: There are no ulcers or rashes noted. PSYCHIATRIC: The patient has a normal affect.  DATA:  I have independently interpreted his duplex of his stent graft which shows that the maximum diameter of the aneurysm is 3.2 cm. This has not changed compared this study in March of 2013.  MEDICAL ISSUES:  Abdominal aneurysm without mention of rupture This patient underwent Ivar in 2009. This was for a 5.3 cm aneurysm. The aneurysm has shrunk down to 3.2 cm and remained stable in size. This reason I think it is safe  to continue his follow up in 18 months. I have ordered a follow duplex scan in 18 months and we will see him back at that time. He is not a smoker. He is on aspirin. He remains fairly active and swims. He knows to call sooner if he has problems.   Kaliann Coryell S [facility] [place] [facility]: [phone]

## 2013-05-17 NOTE — Addendum Note (Signed)
Addended by: Sharee Pimple on: 05/17/2013 08:35 AM   Modules accepted: Orders

## 2013-07-11 ENCOUNTER — Other Ambulatory Visit: Payer: Self-pay | Admitting: Cardiovascular Disease

## 2013-09-13 ENCOUNTER — Ambulatory Visit (INDEPENDENT_AMBULATORY_CARE_PROVIDER_SITE_OTHER): Payer: 59 | Admitting: Cardiovascular Disease

## 2013-09-13 ENCOUNTER — Encounter: Payer: Self-pay | Admitting: Cardiovascular Disease

## 2013-09-13 VITALS — BP 160/72 | HR 47 | Ht 70.0 in | Wt 184.0 lb

## 2013-09-13 DIAGNOSIS — E785 Hyperlipidemia, unspecified: Secondary | ICD-10-CM

## 2013-09-13 DIAGNOSIS — I719 Aortic aneurysm of unspecified site, without rupture: Secondary | ICD-10-CM

## 2013-09-13 DIAGNOSIS — I498 Other specified cardiac arrhythmias: Secondary | ICD-10-CM

## 2013-09-13 DIAGNOSIS — I2510 Atherosclerotic heart disease of native coronary artery without angina pectoris: Secondary | ICD-10-CM

## 2013-09-13 DIAGNOSIS — I10 Essential (primary) hypertension: Secondary | ICD-10-CM

## 2013-09-13 MED ORDER — METHYLDOPA 250 MG PO TABS: 250.0000 mg | ORAL_TABLET | Freq: Two times a day (BID) | ORAL | Status: DC

## 2013-09-13 NOTE — Progress Notes (Signed)
Patient ID: [name] Brown, male   DOB: June 15, 1951, 62 y.o.   MRN: [id number] [name] Brown is seen today for F/U of elevated lipids, HTN, CABG in 2001, AAA with endograf 2009. 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 Compliant with meds. Denies SSCP, dyspnea, palpitations, abdominal pain edema. Some bradycardia in past with beta blockers and severe myalgias with statins. Las LDL this month 117. Reviewed records and he has had severe myalias with all statins and elevated CPK's. Called Dr Adele Dan office and reviewed records. Had endograf 2009 for 5.3 cm aneurysm. [place] 10/22/10 No endoleak and residual aneurysm 3 3.7 cm decreased in size. Having duplex in a year.  Work with [employer] going well.    BP readings at home show some systolic HTN.  Unable to use beta blockers due to bradycardia Compliant with meds   ROS: Denies fever, malais, weight loss, blurry vision, decreased visual acuity, cough, sputum, SOB, hemoptysis, pleuritic pain, palpitaitons, heartburn, abdominal pain, melena, lower extremity edema, claudication, or rash.  All other systems reviewed and negative  General: Affect appropriate Healthy:  appears stated age HEENT: normal Neck supple with no adenopathy JVP normal no bruits no thyromegaly Lungs clear with no wheezing and good diaphragmatic motion Heart:  S1/S2 no murmur, no rub, gallop or click PMI normal Abdomen: benighn, BS positve, no tenderness, AAA palpable but not tender no bruit.  No HSM or HJR Distal pulses intact with no bruits No edema Neuro non-focal Skin warm and dry No muscular weakness   Current Outpatient Prescriptions  Medication Sig Dispense Refill  . aspirin 325 MG tablet Take 325 mg by mouth daily.        Marland Kitchen doxycycline (VIBRAMYCIN) 100 MG capsule Take 25 mg by mouth 2 (two) times daily. 1/4 tab 2 times daily      . fish oil-omega-3 fatty acids 1000 MG capsule Take 1,200 mg by mouth 2 (two) times daily.       . Glucosamine-Chondroitin  (GLUCOSAMINE CHONDR COMPLEX PO) 500 mg 2 times daily       . hydrochlorothiazide (MICROZIDE) 12.5 MG capsule Take 1 capsule by mouth  daily  90 capsule  1  . multivitamin-iron-minerals-folic acid (CENTRUM) chewable tablet Chew 1 tablet by mouth daily.      . niacin (NIASPAN) 1000 MG CR tablet Take 1,000 mg by mouth 2 (two) times daily.        . ramipril (ALTACE) 10 MG capsule Take 1 capsule (10 mg total) by mouth 2 (two) times daily.  60 capsule  11  . verapamil (CALAN) 120 MG tablet Take 1 tablet (120 mg total) by mouth 2 (two) times daily.  60 tablet  5  . ZETIA 10 MG tablet Take 1 tablet by mouth  daily  90 tablet  3  . [DISCONTINUED] metoprolol (LOPRESSOR) 50 MG tablet Take 1 tablet (50 mg total) by mouth daily. 1/2 tab po once daily       No current facility-administered medications for this visit.    Allergies  Statins  Electrocardiogram:  SR rate 47 PR 218  LAD   Assessment and Plan

## 2013-09-13 NOTE — Assessment & Plan Note (Signed)
Cholesterol is at goal.  Continue current dose of statin and diet Rx.  No myalgias or side effects.  F/U  LFT's in 6 months. Lab Results  Component Value Date   LDLCALC 131* 09/06/2012             

## 2013-09-13 NOTE — Assessment & Plan Note (Signed)
Stable with no angina and good activity level.  Continue medical Rx  

## 2013-09-13 NOTE — Assessment & Plan Note (Signed)
F/U [facility] Dr Edilia Bo persistant aneurysm post stent but stable at 3.2 cm

## 2013-09-13 NOTE — Assessment & Plan Note (Signed)
Some evidence of conduction disease with PR 218  Asymptomatic Beta Blockers stopped

## 2013-09-13 NOTE — Assessment & Plan Note (Signed)
Add aldomet 250 bid  F/u 3 months  With old grafts and aneurysm think we need to be fairly aggressive with BP control

## 2013-09-13 NOTE — Patient Instructions (Addendum)
Your physician recommends that you schedule a follow-up appointment in:   3 MONTHS WITH DR [facility]   Your physician has recommended you make the following change in your medication:  START ALDOMET  250 MG  1 TAB  TWICE DAILY

## 2013-10-12 ENCOUNTER — Ambulatory Visit
Admission: RE | Admit: 2013-10-12 | Discharge: 2013-10-12 | Disposition: A | Discharge status: Home / Self Care | Payer: 59 | Source: Ambulatory Visit | Attending: Family Medicine | Admitting: Family Medicine

## 2013-10-12 ENCOUNTER — Other Ambulatory Visit: Payer: Self-pay | Admitting: Family Medicine

## 2013-10-12 DIAGNOSIS — R51 Headache: Principal | ICD-10-CM

## 2013-10-12 DIAGNOSIS — R110 Nausea: Secondary | ICD-10-CM

## 2013-10-12 DIAGNOSIS — R519 Headache, unspecified: Secondary | ICD-10-CM

## 2013-10-12 MED ORDER — IOHEXOL 300 MG/ML  SOLN
30.0000 mL | Freq: Once | INTRAMUSCULAR | Status: AC | PRN
  Administered 2013-10-12: 30 mL via ORAL

## 2013-10-12 MED ORDER — IOHEXOL 300 MG/ML  SOLN
100.0000 mL | Freq: Once | INTRAMUSCULAR | Status: AC | PRN
Start: 2013-10-12 — End: 2013-10-12
  Administered 2013-10-12: 100 mL via INTRAVENOUS

## 2013-10-25 ENCOUNTER — Encounter (HOSPITAL_COMMUNITY): Payer: Self-pay | Admitting: Emergency Medicine

## 2013-10-25 ENCOUNTER — Inpatient Hospital Stay (HOSPITAL_COMMUNITY)
Admission: EM | Admit: 2013-10-25 | Discharge: 2013-10-29 | DRG: 280 | Disposition: A | Discharge status: Home / Self Care | Payer: 59 | Attending: Cardiology | Admitting: Cardiology

## 2013-10-25 ENCOUNTER — Emergency Department (HOSPITAL_COMMUNITY): Payer: 59

## 2013-10-25 ENCOUNTER — Other Ambulatory Visit: Payer: Self-pay | Admitting: Cardiovascular Disease

## 2013-10-25 DIAGNOSIS — IMO0001 Reserved for inherently not codable concepts without codable children: Secondary | ICD-10-CM | POA: Diagnosis present

## 2013-10-25 DIAGNOSIS — I214 Non-ST elevation (NSTEMI) myocardial infarction: Principal | ICD-10-CM | POA: Diagnosis present

## 2013-10-25 DIAGNOSIS — R079 Chest pain, unspecified: Secondary | ICD-10-CM

## 2013-10-25 DIAGNOSIS — Z87891 Personal history of nicotine dependence: Secondary | ICD-10-CM

## 2013-10-25 DIAGNOSIS — T82897A Other specified complication of cardiac prosthetic devices, implants and grafts, initial encounter: Secondary | ICD-10-CM | POA: Diagnosis present

## 2013-10-25 DIAGNOSIS — E785 Hyperlipidemia, unspecified: Secondary | ICD-10-CM | POA: Diagnosis present

## 2013-10-25 DIAGNOSIS — I714 Abdominal aortic aneurysm, without rupture, unspecified: Secondary | ICD-10-CM | POA: Diagnosis present

## 2013-10-25 DIAGNOSIS — Y831 Surgical operation with implant of artificial internal device as the cause of abnormal reaction of the patient, or of later complication, without mention of misadventure at the time of the procedure: Secondary | ICD-10-CM | POA: Diagnosis present

## 2013-10-25 DIAGNOSIS — R197 Diarrhea, unspecified: Secondary | ICD-10-CM | POA: Diagnosis present

## 2013-10-25 DIAGNOSIS — Z79899 Other long term (current) drug therapy: Secondary | ICD-10-CM

## 2013-10-25 DIAGNOSIS — I200 Unstable angina: Secondary | ICD-10-CM | POA: Diagnosis present

## 2013-10-25 DIAGNOSIS — I4891 Unspecified atrial fibrillation: Secondary | ICD-10-CM | POA: Diagnosis present

## 2013-10-25 DIAGNOSIS — D649 Anemia, unspecified: Secondary | ICD-10-CM | POA: Diagnosis present

## 2013-10-25 DIAGNOSIS — Z889 Allergy status to unspecified drugs, medicaments and biological substances status: Secondary | ICD-10-CM

## 2013-10-25 DIAGNOSIS — I25810 Atherosclerosis of coronary artery bypass graft(s) without angina pectoris: Secondary | ICD-10-CM | POA: Diagnosis present

## 2013-10-25 DIAGNOSIS — Z7982 Long term (current) use of aspirin: Secondary | ICD-10-CM

## 2013-10-25 DIAGNOSIS — I480 Paroxysmal atrial fibrillation: Secondary | ICD-10-CM | POA: Diagnosis present

## 2013-10-25 DIAGNOSIS — E43 Unspecified severe protein-calorie malnutrition: Secondary | ICD-10-CM | POA: Diagnosis present

## 2013-10-25 DIAGNOSIS — G43909 Migraine, unspecified, not intractable, without status migrainosus: Secondary | ICD-10-CM | POA: Diagnosis present

## 2013-10-25 DIAGNOSIS — I498 Other specified cardiac arrhythmias: Secondary | ICD-10-CM

## 2013-10-25 DIAGNOSIS — Z951 Presence of aortocoronary bypass graft: Secondary | ICD-10-CM | POA: Diagnosis present

## 2013-10-25 DIAGNOSIS — I442 Atrioventricular block, complete: Secondary | ICD-10-CM | POA: Diagnosis present

## 2013-10-25 DIAGNOSIS — I10 Essential (primary) hypertension: Secondary | ICD-10-CM | POA: Diagnosis present

## 2013-10-25 DIAGNOSIS — R0989 Other specified symptoms and signs involving the circulatory and respiratory systems: Secondary | ICD-10-CM | POA: Diagnosis present

## 2013-10-25 DIAGNOSIS — I441 Atrioventricular block, second degree: Secondary | ICD-10-CM | POA: Diagnosis not present

## 2013-10-25 DIAGNOSIS — F411 Generalized anxiety disorder: Secondary | ICD-10-CM

## 2013-10-25 DIAGNOSIS — I2510 Atherosclerotic heart disease of native coronary artery without angina pectoris: Secondary | ICD-10-CM

## 2013-10-25 DIAGNOSIS — I2582 Chronic total occlusion of coronary artery: Secondary | ICD-10-CM | POA: Diagnosis present

## 2013-10-25 DIAGNOSIS — Z8249 Family history of ischemic heart disease and other diseases of the circulatory system: Secondary | ICD-10-CM

## 2013-10-25 DIAGNOSIS — I719 Aortic aneurysm of unspecified site, without rupture: Secondary | ICD-10-CM | POA: Diagnosis present

## 2013-10-25 LAB — CBC WITH DIFFERENTIAL/PLATELET
BASOS ABS: 0 10*3/uL (ref 0.0–0.1)
Basophils Relative: 1 % (ref 0–1)
Eosinophils Absolute: 0.5 10*3/uL (ref 0.0–0.7)
Eosinophils Relative: 6 % — ABNORMAL HIGH (ref 0–5)
HCT: 31.9 % — ABNORMAL LOW (ref 39.0–52.0)
Hemoglobin: 11.4 g/dL — ABNORMAL LOW (ref 13.0–17.0)
Lymphocytes Relative: 24 % (ref 12–46)
Lymphs Abs: 1.8 10*3/uL (ref 0.7–4.0)
MCH: 32.6 pg (ref 26.0–34.0)
MCHC: 35.7 g/dL (ref 30.0–36.0)
MCV: 91.1 fL (ref 78.0–100.0)
Monocytes Absolute: 0.8 10*3/uL (ref 0.1–1.0)
Monocytes Relative: 11 % (ref 3–12)
Neutro Abs: 4.7 10*3/uL (ref 1.7–7.7)
Neutrophils Relative %: 60 % (ref 43–77)
PLATELETS: 372 10*3/uL (ref 150–400)
RBC: 3.5 MIL/uL — ABNORMAL LOW (ref 4.22–5.81)
RDW: 13.4 % (ref 11.5–15.5)
WBC: 7.8 10*3/uL (ref 4.0–10.5)

## 2013-10-25 LAB — POCT I-STAT TROPONIN I: TROPONIN I, POC: 0.03 ng/mL (ref 0.00–0.08)

## 2013-10-25 LAB — BASIC METABOLIC PANEL
BUN: 14 mg/dL (ref 6–23)
CALCIUM: 9.1 mg/dL (ref 8.4–10.5)
CO2: 22 mEq/L (ref 19–32)
Chloride: 99 mEq/L (ref 96–112)
Creatinine, Ser: 1.04 mg/dL (ref 0.50–1.35)
GFR calc Af Amer: 88 mL/min — ABNORMAL LOW (ref >90)
GFR calc non Af Amer: 76 mL/min — ABNORMAL LOW (ref >90)
Glucose, Bld: 96 mg/dL (ref 70–99)
Potassium: 3.5 mEq/L — ABNORMAL LOW (ref 3.7–5.3)
SODIUM: 136 meq/L — AB (ref 137–147)

## 2013-10-25 MED ORDER — ACETAMINOPHEN 325 MG PO TABS
650.0000 mg | ORAL_TABLET | ORAL | Status: DC | PRN
  Administered 2013-10-26 – 2013-10-27 (×2): 650 mg via ORAL
  Filled 2013-10-25 (×2): qty 2

## 2013-10-25 MED ORDER — HEPARIN BOLUS VIA INFUSION
4000.0000 [IU] | Freq: Once | INTRAVENOUS | Status: AC
  Administered 2013-10-25: 4000 [IU] via INTRAVENOUS
  Filled 2013-10-25: qty 4000

## 2013-10-25 MED ORDER — HYDROCHLOROTHIAZIDE 12.5 MG PO CAPS
12.5000 mg | ORAL_CAPSULE | Freq: Every day | ORAL | Status: DC
  Administered 2013-10-27 – 2013-10-29 (×3): 12.5 mg via ORAL
  Filled 2013-10-25 (×4): qty 1

## 2013-10-25 MED ORDER — OMEGA-3-ACID ETHYL ESTERS 1 G PO CAPS
1000.0000 mg | ORAL_CAPSULE | Freq: Two times a day (BID) | ORAL | Status: DC
  Administered 2013-10-26 – 2013-10-29 (×8): 1000 mg via ORAL
  Filled 2013-10-25 (×9): qty 1

## 2013-10-25 MED ORDER — ASPIRIN EC 81 MG PO TBEC
81.0000 mg | DELAYED_RELEASE_TABLET | Freq: Every day | ORAL | Status: DC
  Administered 2013-10-26 – 2013-10-29 (×4): 81 mg via ORAL
  Filled 2013-10-25 (×4): qty 1

## 2013-10-25 MED ORDER — NITROGLYCERIN IN D5W 200-5 MCG/ML-% IV SOLN
INTRAVENOUS | Status: AC
  Administered 2013-10-25: 66.667 ug/min via INTRAVENOUS
  Filled 2013-10-25: qty 250

## 2013-10-25 MED ORDER — NITROGLYCERIN 0.4 MG SL SUBL
0.4000 mg | SUBLINGUAL_TABLET | SUBLINGUAL | Status: DC | PRN
Start: 2013-10-25 — End: 2013-10-25
  Administered 2013-10-25: 0.4 mg via SUBLINGUAL

## 2013-10-25 MED ORDER — METRONIDAZOLE 500 MG PO TABS
500.0000 mg | ORAL_TABLET | Freq: Three times a day (TID) | ORAL | Status: DC
  Administered 2013-10-26 – 2013-10-29 (×10): 500 mg via ORAL
  Filled 2013-10-25 (×14): qty 1

## 2013-10-25 MED ORDER — VERAPAMIL HCL 120 MG PO TABS
120.0000 mg | ORAL_TABLET | Freq: Two times a day (BID) | ORAL | Status: DC
  Administered 2013-10-26 – 2013-10-28 (×6): 120 mg via ORAL
  Filled 2013-10-25 (×9): qty 1

## 2013-10-25 MED ORDER — SODIUM CHLORIDE 0.9 % IJ SOLN
3.0000 mL | Freq: Two times a day (BID) | INTRAMUSCULAR | Status: DC
  Administered 2013-10-26: 3 mL via INTRAVENOUS

## 2013-10-25 MED ORDER — POTASSIUM CHLORIDE ER 10 MEQ PO TBCR
40.0000 meq | EXTENDED_RELEASE_TABLET | Freq: Once | ORAL | Status: AC
  Administered 2013-10-26: 40 meq via ORAL
  Filled 2013-10-25: qty 4

## 2013-10-25 MED ORDER — ONDANSETRON HCL 4 MG/2ML IJ SOLN
4.0000 mg | Freq: Four times a day (QID) | INTRAMUSCULAR | Status: DC | PRN
  Administered 2013-10-26 – 2013-10-28 (×6): 4 mg via INTRAVENOUS
  Filled 2013-10-25 (×6): qty 2

## 2013-10-25 MED ORDER — NITROGLYCERIN IN D5W 200-5 MCG/ML-% IV SOLN
2.0000 ug/min | INTRAVENOUS | Status: DC
  Administered 2013-10-25: 66.667 ug/min via INTRAVENOUS

## 2013-10-25 MED ORDER — NITROGLYCERIN 0.4 MG SL SUBL
0.4000 mg | SUBLINGUAL_TABLET | SUBLINGUAL | Status: DC | PRN
  Administered 2013-10-27: 0.4 mg via SUBLINGUAL
  Filled 2013-10-25: qty 25

## 2013-10-25 MED ORDER — NITROGLYCERIN 2 % TD OINT
1.0000 [in_us] | TOPICAL_OINTMENT | Freq: Four times a day (QID) | TRANSDERMAL | Status: DC
  Administered 2013-10-25: 1 [in_us] via TOPICAL
  Filled 2013-10-25: qty 30

## 2013-10-25 MED ORDER — SODIUM CHLORIDE 0.9 % IV SOLN: 250.0000 mL | INTRAVENOUS | Status: DC | PRN

## 2013-10-25 MED ORDER — HEPARIN (PORCINE) IN NACL 100-0.45 UNIT/ML-% IJ SOLN
1600.0000 [IU]/h | INTRAMUSCULAR | Status: DC
  Administered 2013-10-25: 1000 [IU]/h via INTRAVENOUS
  Administered 2013-10-26: 1300 [IU]/h via INTRAVENOUS
  Filled 2013-10-25 (×2): qty 250

## 2013-10-25 MED ORDER — RAMIPRIL 10 MG PO CAPS
10.0000 mg | ORAL_CAPSULE | Freq: Two times a day (BID) | ORAL | Status: DC
  Administered 2013-10-26 – 2013-10-29 (×7): 10 mg via ORAL
  Filled 2013-10-25 (×9): qty 1

## 2013-10-25 MED ORDER — NITROGLYCERIN 2 % TD OINT
1.0000 [in_us] | TOPICAL_OINTMENT | Freq: Once | TRANSDERMAL | Status: AC
  Administered 2013-10-25: 1 [in_us] via TOPICAL
  Filled 2013-10-25: qty 1

## 2013-10-25 MED ORDER — METHYLDOPA 250 MG PO TABS
250.0000 mg | ORAL_TABLET | Freq: Two times a day (BID) | ORAL | Status: DC
  Administered 2013-10-26 – 2013-10-28 (×6): 250 mg via ORAL
  Filled 2013-10-25 (×9): qty 1

## 2013-10-25 MED ORDER — NIACIN ER 500 MG PO CPCR
1000.0000 mg | ORAL_CAPSULE | Freq: Two times a day (BID) | ORAL | Status: DC
  Administered 2013-10-26 – 2013-10-29 (×8): 1000 mg via ORAL
  Filled 2013-10-25 (×9): qty 2

## 2013-10-25 MED ORDER — SODIUM CHLORIDE 0.9 % IJ SOLN: 3.0000 mL | INTRAMUSCULAR | Status: DC | PRN

## 2013-10-25 MED ORDER — EZETIMIBE 10 MG PO TABS
10.0000 mg | ORAL_TABLET | Freq: Every day | ORAL | Status: DC
  Administered 2013-10-26 – 2013-10-29 (×4): 10 mg via ORAL
  Filled 2013-10-25 (×4): qty 1

## 2013-10-25 NOTE — ED Notes (Signed)
Report given to 3W RN

## 2013-10-25 NOTE — ED Notes (Signed)
Pt began having chest pain around 6 PM this evening. Pain was 9/10. EMS arrived 12 lead unremarkable. Pt took ASA at home and was given nitro x 2 by EMS. Pt now denies chest pain.  Pt was hypertensive when EMS arrived, but BP dropped to 126/82 with Nitro. Pt was 98% on room air.  Pt is allergic to statins. Pt had triple bypass and aortic stent. Pt is currently experiencing a parasite that gives him diarrhea.  Pt prefers to be called Hank

## 2013-10-25 NOTE — ED Provider Notes (Signed)
CSN: [id number]     Arrival date & time 10/25/13  1946 History   First MD Initiated Contact with Patient 10/25/13 1947     Chief Complaint  Patient presents with  . Chest Pain   (Consider location/radiation/quality/duration/timing/severity/associated sxs/prior Treatment) HPI Comments: Patient is a 63 year old male with a past medical history of hypertension, hyperlipidemia, CAD and aortic aneurysm with a previous surgical history of CABG who presents with chest pain that started prior to arrival. Patient reports he was cutting his pills and had sudden onset of chest pain located in his central chest with radiation to his left neck. The pain is described as a heaviness and is severe. He denies associated symptoms. The pain lasted about 30 minutes before resolving after taking nitro. Patient reports two other episodes similar to this in the past 24 hours but the most recent episode was the worst. No aggravating factors or known triggers. Patient sees Dr. Johnsie Cancel with [facility]. Patient denies any previous history of MI.   Patient is a 63 y.o. male presenting with chest pain.  Chest Pain Associated symptoms: no abdominal pain, no dizziness, no dysphagia, no fatigue, no fever, no nausea, no palpitations, no shortness of breath, not vomiting and no weakness     Past Medical History  Diagnosis Date  . Bradycardia   . Hyperlipidemia   . HTN (hypertension)   . CAD (coronary artery disease) CABG 03/2000    Median sternotomy for coronary artery bypass grafting x 3 (left  . Aortic aneurysm Endograf 2009   Past Surgical History  Procedure Laterality Date  . Coronary artery bypass graft      AAA stent graft on 12/01/2007 at [facility]  coronary bypass in 2001. Dr. Roxy Manns.   Family History  Problem Relation Age of Onset  . Heart attack Mother   . Heart disease Mother   . Heart attack Father   . Heart disease Father    History  Substance Use Topics  . Smoking status:  Former Smoker -- 2.00 packs/day for 30 years    Types: Cigarettes    Quit date: 01/05/1992  . Smokeless tobacco: Never Used     Comment: quit 19 yrs ago  . Alcohol Use: No    Review of Systems  Constitutional: Negative for fever, chills and fatigue.  HENT: Negative for trouble swallowing.   Eyes: Negative for visual disturbance.  Respiratory: Negative for shortness of breath.   Cardiovascular: Positive for chest pain. Negative for palpitations.  Gastrointestinal: Negative for nausea, vomiting, abdominal pain and diarrhea.  Genitourinary: Negative for dysuria and difficulty urinating.  Musculoskeletal: Negative for arthralgias and neck pain.  Skin: Negative for color change.  Neurological: Negative for dizziness and weakness.  Psychiatric/Behavioral: Negative for dysphoric mood.    Allergies  Statins  Home Medications   Current Outpatient Rx  Name  Route  Sig  Dispense  Refill  . aspirin 325 MG tablet   Oral   Take 325 mg by mouth daily.           Marland Kitchen doxycycline (VIBRAMYCIN) 100 MG capsule   Oral   Take 25 mg by mouth 2 (two) times daily. 1/4 tab 2 times daily         . fish oil-omega-3 fatty acids 1000 MG capsule   Oral   Take 1,200 mg by mouth 2 (two) times daily.          . Glucosamine-Chondroitin (GLUCOSAMINE CHONDR COMPLEX PO)  500 mg 2 times daily          . hydrochlorothiazide (MICROZIDE) 12.5 MG capsule      Take 1 capsule by mouth  daily   90 capsule   1   . methyldopa (ALDOMET) 250 MG tablet   Oral   Take 1 tablet (250 mg total) by mouth 2 (two) times daily.   60 tablet   11   . multivitamin-iron-minerals-folic acid (CENTRUM) chewable tablet   Oral   Chew 1 tablet by mouth daily.         . niacin (NIASPAN) 1000 MG CR tablet   Oral   Take 1,000 mg by mouth 2 (two) times daily.           . ramipril (ALTACE) 10 MG capsule   Oral   Take 1 capsule (10 mg total) by mouth 2 (two) times daily.   60 capsule   11   . verapamil  (CALAN) 120 MG tablet   Oral   Take 1 tablet (120 mg total) by mouth 2 (two) times daily.   60 tablet   5   . ZETIA 10 MG tablet      Take 1 tablet by mouth  daily   90 tablet   3    BP 122/68  Pulse 54  Temp(Src) 98.2 F (36.8 C) (Oral)  Resp 10  SpO2 96% Physical Exam  Nursing note and vitals reviewed. Constitutional: He is oriented to person, place, and time. He appears well-developed and well-nourished. No distress.  HENT:  Head: Normocephalic and atraumatic.  Eyes: Conjunctivae and EOM are normal.  Neck: Normal range of motion.  Cardiovascular: Normal rate and regular rhythm.  Exam reveals no gallop and no friction rub.   No murmur heard. Pulmonary/Chest: Effort normal and breath sounds normal. He has no wheezes. He has no rales. He exhibits no tenderness.  Abdominal: Soft. He exhibits no distension. There is no tenderness. There is no rebound.  Musculoskeletal: Normal range of motion.  Neurological: He is alert and oriented to person, place, and time. Coordination normal.  Speech is goal-oriented. Moves limbs without ataxia.   Skin: Skin is warm and dry.  Psychiatric: He has a normal mood and affect. His behavior is normal.    ED Course  Procedures (including critical care time) Labs Review Labs Reviewed  CBC WITH DIFFERENTIAL - Abnormal; Notable for the following:    RBC 3.50 (*)    Hemoglobin 11.4 (*)    HCT 31.9 (*)    Eosinophils Relative 6 (*)    All other components within normal limits  BASIC METABOLIC PANEL - Abnormal; Notable for the following:    Sodium 136 (*)    Potassium 3.5 (*)    GFR calc non Af Amer 76 (*)    GFR calc Af Amer 88 (*)    All other components within normal limits  HEPARIN LEVEL (UNFRACTIONATED)  CBC  TROPONIN I  TROPONIN I  TROPONIN I  BASIC METABOLIC PANEL  POCT I-STAT TROPONIN I   Imaging Review Dg Chest 2 View  10/25/2013   CLINICAL DATA:  Chest pain, no shortness of breath.  EXAM: CHEST  2 VIEW  COMPARISON:  Chest  radiograph December 09, 2007.  FINDINGS: The cardiac silhouette is unremarkable, status post median sternotomy for apparent Coronary artery bypass grafting. Strandy densities in left lung base, with mild elevation of left hemidiaphragm. No pleural effusions. Very mild chronic interstitial changes and increased lung volumes. No pneumothorax.  Partially imaged abdominal aortic. Soft tissue planes and included osseous structures are nonsuspicious.  IMPRESSION: Left lower lobe atelectasis, and a background of mild chronic interstitial changes/COPD.   Electronically Signed   By: Elon Alas   On: 10/25/2013 20:36    EKG Interpretation    Date/Time:    Ventricular Rate:    PR Interval:    QRS Duration:   QT Interval:    QTC Calculation:   R Axis:     Text Interpretation:              MDM   1. Chest pain     8:08 PM Labs and chest xray pending. Patient took ASA 324mg  and nitro at home. Patient is currently chest pain free. Vitals stable and patient afebrile. Patient's cardiologist is Dr. Johnsie Cancel.    Alvina Chou, PA-C 10/25/13 2349

## 2013-10-25 NOTE — Progress Notes (Signed)
ANTICOAGULATION CONSULT NOTE - Initial Consult  Pharmacy Consult for heparin Indication: chest pain/ACS  Allergies  Allergen Reactions  . Statins     Myalgias with elevated CPKs    Patient Measurements: Height: 5' 10.08" (178 cm) Weight: 182 lb 15.7 oz (83 kg) IBW/kg (Calculated) : 73.18 Heparin Dosing Weight: 80kg  Vital Signs: Temp: 98.2 F (36.8 C) (01/15 1946) Temp src: Oral (01/15 1946) BP: 122/68 mmHg (01/15 1946) Pulse Rate: 54 (01/15 1946)  Labs:  Recent Labs  10/25/13 1951  HGB 11.4*  HCT 31.9*  PLT 372  CREATININE 1.04    Estimated Creatinine Clearance: 77.2 ml/min (by C-G formula based on Cr of 1.04).   Medical History: Past Medical History  Diagnosis Date  . Bradycardia   . Hyperlipidemia   . HTN (hypertension)   . CAD (coronary artery disease) CABG 03/2000    Median sternotomy for coronary artery bypass grafting x 3 (left  . Aortic aneurysm Endograf 2009    Assessment: 63 year old male admitted to [facility] with chest pain. Significant past medical history of CAD with CABG in past stent. On no anticoagulants prior to admission. Orders to start IV heparin for acs.  Goal of Therapy:  Heparin level 0.3-0.7 units/ml Monitor platelets by anticoagulation protocol: Yes   Plan:  Give 4000 units bolus x 1 Start heparin infusion at 1000 units/hr Check anti-Xa level in 6 hours and daily while on heparin Continue to monitor H&H and platelets  Erin Hearing PharmD., BCPS Clinical Pharmacist Pager [phone] 10/25/2013 9:42 PM

## 2013-10-25 NOTE — ED Notes (Signed)
Pt states he is beginning to have some chest pressure again. Rates it 1/10

## 2013-10-25 NOTE — H&P (Addendum)
History and Physical  Patient ID: [name] Brown MRN: [id number], SOB: 12-01-1950 64 y.o. Date of Encounter: 10/25/2013, 9:39 PM  Primary Physician: Lujean Amel, MD Primary Cardiologist: Johnsie Cancel  Chief Complaint: chest pain  HPI: 63 y.o. male w/ PMHx significant for CAD s/p CABG (3v LIMA to LAD, RIMA to PDA, L radial to OM1, s/p stent 2008, AAA s/p EVAR 2009 who presented to [facility] on 10/25/2013 with complaints of chest pain. Describes it as a pressure, radiates to right arm and left neck. Described as a heaviness. Has not needed nitro in over 20 yrs. Has had several episodes over the last 36 hours and they are increasing in severity prompting EMS call today after the pain was significantly worse. No nausea, vomiting, diaphoresis, SOB or dizziness. Have occurred at rest, once waking him from sleep. Has not noted any exertional symptoms recently.  Had episode during interview with pain escalating to 6-7, relieved with nitro SL x 1. Paste placed.  Noted to have atypical medication regimen. This is due to his need for verapamil for migraine prophylaxis (refuses to try alternative) which has led to relative bradycardia. Recently added methyldopa for BP control and he reports it works well. Also not a statin due to myalgias which he has had with atorvastatin ,rosuvastatin and red yeast rice. Quite adamant that he does not want a trial of low dose or different statin at this point.  Has also recently had GI issues including diarrhea that started at Christmas and workup thus far has been unrevealing. CT scan actually showed constipation but then diarrhea returned. GI doc recently started him on antibiotic for presumed infectious diarrhea (parasites?) with flagyl. States his recent labs at GI (Dr. Amedeo Plenty with [facility]) were "normal". Early stools were dark but not since.  EKG revealed NSR with LAD,  no acute ST changes during episode of chest pain. CXR was without acute cardiopulmonary  abnormalities (chronic COPD changes). Labs are significant for anemia. Negative initial POC troponin.   Past Medical History  Diagnosis Date  . Bradycardia   . Hyperlipidemia   . HTN (hypertension)   . CAD (coronary artery disease) CABG 03/2000    Median sternotomy for coronary artery bypass grafting x 3 (left  . Aortic aneurysm Endograf 2009     Surgical History:  Past Surgical History  Procedure Laterality Date  . Coronary artery bypass graft      AAA stent graft on 12/01/2007 at [facility]  coronary bypass in 2001. Dr. Roxy Manns.     Home Meds: Prior to Admission medications   Medication Sig Start Date End Date Taking? Authorizing Provider  aspirin 325 MG tablet Take 325 mg by mouth daily.     Yes Historical Provider, MD  doxycycline (VIBRAMYCIN) 100 MG capsule Take 25 mg by mouth 2 (two) times daily. 1/4 tab 2 times daily   Yes Historical Provider, MD  fish oil-omega-3 fatty acids 1000 MG capsule Take 1,200 mg by mouth 2 (two) times daily.    Yes Historical Provider, MD  Glucosamine-Chondroitin (GLUCOSAMINE CHONDR COMPLEX PO) Take 500 mg by mouth 2 (two) times daily. 500 mg 2 times daily    Yes Historical Provider, MD  hydrochlorothiazide (MICROZIDE) 12.5 MG capsule Take 1 capsule by mouth  daily 07/11/13  Yes Josue Hector, MD  methyldopa (ALDOMET) 250 MG tablet Take 1 tablet (250 mg total) by mouth 2 (two) times daily. 09/13/13  Yes Josue Hector, MD  metroNIDAZOLE (FLAGYL) 500 MG tablet  Take 500 mg by mouth 3 (three) times daily. 10/24/13  Yes Historical Provider, MD  niacin (NIASPAN) 1000 MG CR tablet Take 1,000 mg by mouth 2 (two) times daily.     Yes Historical Provider, MD  ramipril (ALTACE) 10 MG capsule Take 1 capsule (10 mg total) by mouth 2 (two) times daily. 04/04/13  Yes Josue Hector, MD  verapamil (CALAN) 120 MG tablet Take 1 tablet (120 mg total) by mouth 2 (two) times daily. 04/04/13  Yes Josue Hector, MD  ZETIA 10 MG tablet Take 1 tablet by mouth   daily 07/11/13  Yes Josue Hector, MD    Allergies:  Allergies  Allergen Reactions  . Statins     Myalgias with elevated CPKs    History   Social History  . Marital Status: Married    Spouse Name: N/A    Number of Children: N/A  . Years of Education: N/A   Occupational History  . Not on file.   Social History Main Topics  . Smoking status: Former Smoker -- 2.00 packs/day for 30 years    Types: Cigarettes    Quit date: 01/05/1992  . Smokeless tobacco: Never Used     Comment: quit 19 yrs ago  . Alcohol Use: No  . Drug Use: No  . Sexual Activity: Not on file   Other Topics Concern  . 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     Family History  Problem Relation Age of Onset  . Heart attack Mother   . Heart disease Mother   . Heart attack Father   . Heart disease Father     Review of Systems: General: negative for chills, fever, night sweats or weight changes.  Cardiovascular: see HPI Dermatological: negative for rash Respiratory: negative for cough or wheezing Urologic: negative for hematuria Abdominal: +diarrhea (nonbloody, nonmelena) Neurologic: negative for visual changes, syncope, or dizziness All other systems reviewed and are otherwise negative except as noted above.  Labs:   Lab Results  Component Value Date   WBC 7.8 10/25/2013   HGB 11.4* 10/25/2013   HCT 31.9* 10/25/2013   MCV 91.1 10/25/2013   PLT 372 10/25/2013    Recent Labs Lab 10/25/13 1951  NA 136*  K 3.5*  CL 99  CO2 22  BUN 14  CREATININE 1.04  CALCIUM 9.1  GLUCOSE 96   No results found for this basename: CKTOTAL, CKMB, TROPONINI,  in the last 72 hours Lab Results  Component Value Date   CHOL 185 09/06/2012   HDL 40.70 09/06/2012   LDLCALC 131* 09/06/2012   TRIG 67.0 09/06/2012   No results found for this basename: DDIMER    Radiology/Studies:  Dg Chest 2 View  10/25/2013   CLINICAL DATA:  Chest  pain, no shortness of breath.  EXAM: CHEST  2 VIEW  COMPARISON:  Chest radiograph December 09, 2007.  FINDINGS: The cardiac silhouette is unremarkable, status post median sternotomy for apparent Coronary artery bypass grafting. Strandy densities in left lung base, with mild elevation of left hemidiaphragm. No pleural effusions. Very mild chronic interstitial changes and increased lung volumes. No pneumothorax.  Partially imaged abdominal aortic. Soft tissue planes and included osseous structures are nonsuspicious.  IMPRESSION: Left lower lobe atelectasis, and a background of mild chronic interstitial changes/COPD.   Electronically Signed   By: Elon Alas   On: 10/25/2013 20:36  Ct Head Wo Contrast  10/12/2013   CLINICAL DATA:  63 year old male with headache and nausea x6 days. Initial encounter.  EXAM: CT HEAD WITHOUT CONTRAST  TECHNIQUE: Contiguous axial images were obtained from the base of the skull through the vertex without intravenous contrast.  COMPARISON:  None.  FINDINGS: Visualized paranasal sinuses and mastoids are clear. Visualized orbits and scalp soft tissues are within normal limits. No acute osseous abnormality identified.  Calcified atherosclerosis at the skull base. Cerebral volume is within normal limits for age. Mild dural calcification. No midline shift, ventriculomegaly, mass effect, evidence of mass lesion, intracranial hemorrhage or evidence of cortically based acute infarction. Gray-white matter differentiation is within normal limits throughout the brain. No suspicious intracranial vascular hyperdensity.  IMPRESSION: Normal for age non contrast CT appearance of the brain.   Electronically Signed   By: Lars Pinks M.D.   On: 10/12/2013 16:17   Ct Abdomen Pelvis W Contrast  10/12/2013   CLINICAL DATA:  Nausea, diarrhea  EXAM: CT ABDOMEN AND PELVIS WITH CONTRAST  TECHNIQUE: Multidetector CT imaging of the abdomen and pelvis was performed using the standard protocol following bolus  administration of intravenous contrast.  CONTRAST:  65mL OMNIPAQUE IOHEXOL 300 MG/ML SOLN, 171mL OMNIPAQUE IOHEXOL 300 MG/ML SOLN  COMPARISON:  CTA abdomen pelvis dated 09/18/2009  FINDINGS: Lung bases are clear.  Liver, spleen, pancreas, and adrenal glands are within normal limits.  Gallbladder is unremarkable. No intrahepatic or extrahepatic ductal dilatation.  Bilateral renal cysts, including an 8.7 x 8.1 cm right upper pole renal cyst (series 2/ image 19), a 3.8 x 3.4 cm posterior interpolar left renal cyst (series 2/ image 21), and a 4.1 x 4.9 cm anterior interpolar right renal cyst (series 2/ image 26). 2 mm interpolar nonobstructing bilateral renal calculi (series 2/ images 28 and 30). No hydronephrosis.  No evidence of bowel obstruction. Normal appendix (series 2/ image 66). Moderate stool throughout the colon.  3.0 x 3.6 cm infrarenal abdominal aortic aneurysm (series 2/image 39), decreased, with indwelling aorto bi-iliac stent.  No abdominopelvic ascites.  No suspicious abdominopelvic lymphadenopathy.  Prostate is unremarkable.  No ureteral or bladder calculi.  Bladder is within normal limits.  Calcified pelvic phleboliths.  Degenerative changes of the visualized thoracolumbar spine.  IMPRESSION: No evidence of bowel obstruction.  Normal appendix.  Moderate colonic stool burden, raising the possibility of constipation.  Bilateral 2 mm nonobstructing renal calculi. No ureteral or bladder calculi. No hydronephrosis.  Additional ancillary findings as above.   Electronically Signed   By: Julian Hy M.D.   On: 10/12/2013 16:28     EKG: sinus, LAFB, no ST changes  Physical Exam: Blood pressure 122/68, pulse 54, temperature 98.2 F (36.8 C), temperature source Oral, resp. rate 10, SpO2 96.00%. General: Well developed, well nourished, in no acute distress. Head: Normocephalic, atraumatic, sclera non-icteric, nares are without discharge Neck: Supple. Negative for carotid bruits. JVD not  elevated. Lungs: Clear bilaterally to auscultation without wheezes, rales, or rhonchi. Breathing is unlabored. Heart: RRR with S1 S2. No murmurs, rubs, or gallops appreciated. Abdomen: Soft, non-tender, non-distended with normoactive bowel sounds. No rebound/guarding. No obvious abdominal masses. Msk:  Strength and tone appear normal for age. Extremities: No edema. No clubbing or cyanosis. Distal pedal pulses are 2+ and equal bilaterally. Absent L radial pulse Neuro: Alert and oriented X 3. Moves all extremities spontaneously. Psych:  Responds to questions appropriately with a normal affect.   Problem List 1. Chest pain consistent with accelerating, unstable angina 2.  Known CAD, s/p CABG, s/p stent 3. Hypertension 4. Intolerant to statins 5. AAA s/p repair 6. Normocytic anemia 7. Diarrhea of uncertain etiology 8. Relative bradycardia, unwilling to change verapamil dosing due to migraine prophylaxis  ASSESSMENT AND PLAN:  63 y.o. male w/ PMHx significant for CAD s/p CABG (3v LIMA to LAD, RIMA to PDA, L radial to OM1, s/p stent 2008, AAA s/p EVAR 2009 who presented to [facility] on 10/25/2013 with complaints of chest pain --> consistent with accelerating, unstable angina.  Multiple risk factors including known CAD, HTN, hyperlipidemia. Encouragingly, initial EKG and troponin negative but symptoms and recurrent chest pain at rest are concerning. Aspirin given. Starting heparin. Nitro paste to prevent recurrent angina. If stable over the next bit (will re-evaluate), will admit to telemetry but if recurrence of symptoms, will admit to step down with nitro gtt. Due elevated risk, invasive diagnostic catheterization route is most appropriate at this time.  Unfortunately, unable to use beta blockade as he is bradycardiac and attached to verapamil. Also, intolerant of statins due to myalgia (though could consider pravastatin, or low dose atorvastatin with co-enzyme q10 --> recommend his  primary cardiologist address this as he does not want to try this currently).  He reports his labs were "normal" recently though he now has a normocytic anemia. No clear source of bleeding but has diarrhea that is currently being worked up and evaluated (abd CT relatively normal, empiric antibiotics started by GI doc). Repeat CBC.  NPO at midnight. Prophylaxis: heparin gtt.  Addendum: Once on the floor, during transfer from stretcher to bed, he had onset of recurrent chest pain. Due to instability, patient to be transferred to step down. Nitro gtt started, pain resolving.  Signed, Onalee Hua MD 10/25/2013, 9:39 PM

## 2013-10-26 ENCOUNTER — Encounter (HOSPITAL_COMMUNITY)
Admission: EM | Disposition: A | Discharge status: Home / Self Care | Payer: Self-pay | Source: Home / Self Care | Attending: Cardiology

## 2013-10-26 DIAGNOSIS — E785 Hyperlipidemia, unspecified: Secondary | ICD-10-CM

## 2013-10-26 DIAGNOSIS — I10 Essential (primary) hypertension: Secondary | ICD-10-CM

## 2013-10-26 DIAGNOSIS — I4891 Unspecified atrial fibrillation: Secondary | ICD-10-CM

## 2013-10-26 DIAGNOSIS — I214 Non-ST elevation (NSTEMI) myocardial infarction: Secondary | ICD-10-CM | POA: Diagnosis present

## 2013-10-26 DIAGNOSIS — I2510 Atherosclerotic heart disease of native coronary artery without angina pectoris: Secondary | ICD-10-CM

## 2013-10-26 DIAGNOSIS — I480 Paroxysmal atrial fibrillation: Secondary | ICD-10-CM | POA: Diagnosis present

## 2013-10-26 HISTORY — PX: LEFT HEART CATHETERIZATION WITH CORONARY ANGIOGRAM: SHX5451

## 2013-10-26 LAB — CBC
HCT: 32.6 % — ABNORMAL LOW (ref 39.0–52.0)
HEMOGLOBIN: 11.2 g/dL — AB (ref 13.0–17.0)
MCH: 31.9 pg (ref 26.0–34.0)
MCHC: 34.4 g/dL (ref 30.0–36.0)
MCV: 92.9 fL (ref 78.0–100.0)
PLATELETS: 373 10*3/uL (ref 150–400)
RBC: 3.51 MIL/uL — ABNORMAL LOW (ref 4.22–5.81)
RDW: 13.4 % (ref 11.5–15.5)
WBC: 8.4 10*3/uL (ref 4.0–10.5)

## 2013-10-26 LAB — BASIC METABOLIC PANEL
BUN: 13 mg/dL (ref 6–23)
CHLORIDE: 105 meq/L (ref 96–112)
CO2: 23 mEq/L (ref 19–32)
CREATININE: 0.97 mg/dL (ref 0.50–1.35)
Calcium: 8.8 mg/dL (ref 8.4–10.5)
GFR calc Af Amer: >90 mL/min (ref >90)
GFR calc non Af Amer: 87 mL/min — ABNORMAL LOW (ref >90)
Glucose, Bld: 102 mg/dL — ABNORMAL HIGH (ref 70–99)
Potassium: 3.8 mEq/L (ref 3.7–5.3)
Sodium: 141 mEq/L (ref 137–147)

## 2013-10-26 LAB — TROPONIN I
TROPONIN I: 0.52 ng/mL — AB (ref <0.30)
Troponin I: 0.75 ng/mL (ref <0.30)
Troponin I: 0.95 ng/mL (ref <0.30)

## 2013-10-26 LAB — HEPARIN LEVEL (UNFRACTIONATED)
Heparin Unfractionated: <0.1 IU/mL — ABNORMAL LOW (ref 0.30–0.70)
Heparin Unfractionated: <0.1 IU/mL — ABNORMAL LOW (ref 0.30–0.70)

## 2013-10-26 LAB — POCT ACTIVATED CLOTTING TIME: Activated Clotting Time: 110 seconds

## 2013-10-26 LAB — MRSA PCR SCREENING: MRSA BY PCR: NEGATIVE

## 2013-10-26 SURGERY — LEFT HEART CATHETERIZATION WITH CORONARY ANGIOGRAM
Anesthesia: LOCAL

## 2013-10-26 MED ORDER — HEPARIN BOLUS VIA INFUSION
2000.0000 [IU] | Freq: Once | INTRAVENOUS | Status: AC
  Administered 2013-10-26: 2000 [IU] via INTRAVENOUS
  Filled 2013-10-26: qty 2000

## 2013-10-26 MED ORDER — MIDAZOLAM HCL 2 MG/2ML IJ SOLN
INTRAMUSCULAR | Status: AC
  Filled 2013-10-26: qty 2

## 2013-10-26 MED ORDER — FENTANYL CITRATE 0.05 MG/ML IJ SOLN
INTRAMUSCULAR | Status: AC
  Filled 2013-10-26: qty 2

## 2013-10-26 MED ORDER — HEPARIN (PORCINE) IN NACL 100-0.45 UNIT/ML-% IJ SOLN
1800.0000 [IU]/h | INTRAMUSCULAR | Status: AC
  Administered 2013-10-26: 1600 [IU]/h via INTRAVENOUS
  Administered 2013-10-27 – 2013-10-29 (×5): 1800 [IU]/h via INTRAVENOUS
  Filled 2013-10-26 (×5): qty 250

## 2013-10-26 MED ORDER — HEPARIN (PORCINE) IN NACL 2-0.9 UNIT/ML-% IJ SOLN
INTRAMUSCULAR | Status: AC
  Filled 2013-10-26: qty 1500

## 2013-10-26 MED ORDER — NITROGLYCERIN 0.2 MG/ML ON CALL CATH LAB
INTRAVENOUS | Status: AC
  Filled 2013-10-26: qty 1

## 2013-10-26 MED ORDER — SODIUM CHLORIDE 0.9 % IV SOLN: 1.0000 mL/kg/h | INTRAVENOUS | Status: AC

## 2013-10-26 MED ORDER — LIDOCAINE HCL (PF) 1 % IJ SOLN
INTRAMUSCULAR | Status: AC
  Filled 2013-10-26: qty 30

## 2013-10-26 MED ORDER — DIPHENHYDRAMINE HCL 25 MG PO CAPS
25.0000 mg | ORAL_CAPSULE | Freq: Every evening | ORAL | Status: DC | PRN
  Administered 2013-10-26 – 2013-10-27 (×2): 25 mg via ORAL
  Filled 2013-10-26 (×3): qty 1

## 2013-10-26 MED ORDER — CARVEDILOL 3.125 MG PO TABS
3.1250 mg | ORAL_TABLET | Freq: Two times a day (BID) | ORAL | Status: DC
  Administered 2013-10-26 – 2013-10-27 (×2): 3.125 mg via ORAL
  Filled 2013-10-26 (×6): qty 1

## 2013-10-26 NOTE — Progress Notes (Signed)
CRITICAL VALUE ALERT  Critical value received:  Troponin 0.75  Date of notification:  10/26/2013  Time of notification:  0122  Critical value read back:yes  Nurse who received alert:  Annice Pih, RN  MD notified (1st page):  Dr. Harl Bowie  Time of first page:  0123  MD notified (2nd page):  Time of second page:  Responding MD:  Dr. Elias Else  Time MD responded:  [phone]

## 2013-10-26 NOTE — Progress Notes (Signed)
ANTICOAGULATION CONSULT NOTE - Follow-up  Pharmacy Consult for heparin Indication: chest pain/ACS  Allergies  Allergen Reactions  . Statins     Myalgias with elevated CPKs    Patient Measurements: Height: 5\' 10"  (177.8 cm) Weight: 169 lb 12.1 oz (77 kg) IBW/kg (Calculated) : 73 Heparin Dosing Weight: 80kg  Vital Signs: Temp: 97.8 F (36.6 C) (01/16 0832) Temp src: Oral (01/16 0832) BP: 108/59 mmHg (01/16 0832) Pulse Rate: 63 (01/16 0832)  Labs:  Recent Labs  10/25/13 0004 10/25/13 1951 10/26/13 0233 10/26/13 0530 10/26/13 0945  HGB  --  11.4* 11.2*  --   --   HCT  --  31.9* 32.6*  --   --   PLT  --  372 373  --   --   HEPARINUNFRC  --   --  <0.10*  --  <0.10*  CREATININE  --  1.04 0.97  --   --   TROPONINI 0.75*  --   --  0.95*  --     Estimated Creatinine Clearance: 82.6 ml/min (by C-G formula based on Cr of 0.97).  Assessment: 63 year old male admitted to [facility] with chest pain. Significant past medical history of CAD with CABG in past stent. He continues on IV heparin. Second heparin level remains undetectable. No problems with the line per RN. CBC is stable and no bleeding noted. Planning cardiac cath later today.   Goal of Therapy:  Heparin level 0.3-0.7 units/ml Monitor platelets by anticoagulation protocol: Yes   Plan:  1. Heparin bolus 2000 units IV x 1 2. Increase heparin gtt to 1600 units/hr 3. Check a 6 hour heparin level 4. Continue daily heparin level and CBC  Salome Arnt, PharmD, BCPS Pager # [phone] 10/26/2013 10:54 AM

## 2013-10-26 NOTE — Progress Notes (Signed)
ANTICOAGULATION CONSULT NOTE - Follow-up  Pharmacy Consult for heparin Indication: chest pain/ACS  Allergies  Allergen Reactions  . Statins     Myalgias with elevated CPKs    Patient Measurements: Height: 5\' 10"  (177.8 cm) Weight: 169 lb 12.1 oz (77 kg) IBW/kg (Calculated) : 73 Heparin Dosing Weight: 80kg  Vital Signs: Temp: 97.8 F (36.6 C) (01/16 0832) Temp src: Oral (01/16 0832) BP: 96/56 mmHg (01/16 1126) Pulse Rate: 60 (01/16 1218)  Labs:  Recent Labs  10/25/13 0004 10/25/13 1951 10/26/13 0233 10/26/13 0530 10/26/13 0945 10/26/13 1120  HGB  --  11.4* 11.2*  --   --   --   HCT  --  31.9* 32.6*  --   --   --   PLT  --  372 373  --   --   --   HEPARINUNFRC  --   --  <0.10*  --  <0.10*  --   CREATININE  --  1.04 0.97  --   --   --   TROPONINI 0.75*  --   --  0.95*  --  0.52*    Estimated Creatinine Clearance: 82.6 ml/min (by C-G formula based on Cr of 0.97).  Assessment: 64 year old male admitted to [facility] with chest pain. Significant past medical history of CAD with CABG in past stent. S/p cardiac cath today which showed 3V CAD. Heparin to be restarted 8 hours post-sheath removal. Sheath was removed at 1330 so heparin will restart at 2130. A heparin level was not checked at last rate.   Goal of Therapy:  Heparin level 0.3-0.7 units/ml Monitor platelets by anticoagulation protocol: Yes   Plan:  1. Restart heparin gtt at 1600 untis/hr at 2130 tonight 2. Check an 8 hour heparin level 3. Daily heparin level and CBC 4. F/u cardiology plans  Salome Arnt, PharmD, BCPS Pager # [phone] 10/26/2013 3:12 PM

## 2013-10-26 NOTE — Progress Notes (Signed)
Upon arrival to the unit from the ED, patient started experiencing 10/10 CP.  Pt. Was wearing a 1" nitro paste on his L upper chest.  EKG was obtained and MD notified.  Received orders to start nitro drip.  Chest pain was relieved after the start of the drip.  Pt transferred to 2C09.  Report call to [place], [place].

## 2013-10-26 NOTE — Progress Notes (Signed)
ANTICOAGULATION CONSULT NOTE - Follow Up Consult  Pharmacy Consult for heparin Indication: USAP  Labs:  Recent Labs  10/25/13 0004 10/25/13 1951 10/26/13 0233  HGB  --  11.4* 11.2*  HCT  --  31.9* 32.6*  PLT  --  372 373  HEPARINUNFRC  --   --  <0.10*  CREATININE  --  1.04  --   TROPONINI 0.75*  --   --      Assessment: 63yo male undetectable on heparin with initial dosing, drawn 4hr after bolus.  Goal of Therapy:  Heparin level 0.3-0.7 units/ml   Plan:  Will rebolus with heparin 2000 units x1 and increase gtt by 4 units/kg/hr to 1300 units/hr and check level in 6hr.  Wynona Neat, PharmD, BCPS  10/26/2013,3:49 AM

## 2013-10-26 NOTE — Interval H&P Note (Signed)
History and Physical Interval Note:  10/26/2013 12:06 PM  [name]  has presented today for surgery, with the diagnosis of cp  The various methods of treatment have been discussed with the patient and family. After consideration of risks, benefits and other options for treatment, the patient has consented to  Procedure(s): LEFT HEART CATHETERIZATION WITH CORONARY ANGIOGRAM (N/A) as a surgical intervention .  The patient's history has been reviewed, patient examined, no change in status, stable for surgery.  I have reviewed the patient's chart and labs.  Questions were answered to the patient's satisfaction.   Cath Lab Visit (complete for each Cath Lab visit)  Clinical Evaluation Leading to the Procedure:   ACS: yes  Non-ACS:    Anginal Classification: CCS IV  Anti-ischemic medical therapy: Minimal Therapy (1 class of medications)  Non-Invasive Test Results: No non-invasive testing performed  Prior CABG: Previous CABG        Collier Salina [facility] 10/26/2013 12:06 PM

## 2013-10-26 NOTE — Progress Notes (Signed)
INITIAL NUTRITION ASSESSMENT  DOCUMENTATION CODES Per approved criteria  -Severe malnutrition in the context of acute illness or injury   INTERVENTION: 1.  Advance diet when able, add Ensure Complete supplement once able 2.  Nutritional Management to follow for nutrition care plan  NUTRITION DIAGNOSIS: Unintended weight loss related to inadequate oral intake as evidenced by 9% weight loss  Goal: Patient to meet >/= 90% of estimated nutrition needs  Monitor:  I/Os, weight, labs, PO diet advancement & intake  Reason for Assessment: Malnutrition Screening Tool Risk  63 y.o. male  Admitting Dx: Chest Pain  ASSESSMENT: Patient with PMH of CAD, HTN, hyperlipidemia, and CABG presented to the ER on 1/15 with chest pain. Patient also has a history of GI issues beginning on 12/18. Patient is being treated for parasites. Patient has normocytic anemia.  Patient reports having diarrhea for the past 3-4 weeks. During this time, he has been following a bland diet at home consisting of soups, rice, toast, chicken, and apple sauce. Patient stated that his appetite has been decreased while following the bland diet due to the reoccurring diarrhea and getting tired of eating the same bland foods.   Patient reported a 10 pound weight loss over the past month, however, per weight readings, patient has had 9% loss (severe for time frame). Patient looking forward to eating first meal; agreeable to receiving Ensure Complete supplements in addition to meals once able.  Nutrition Focused Physical Exam:  Subcutaneous Fat:  Orbital Region: mild Upper Arm Region: moderate Thoracic and Lumbar Region: moderate  Muscle:  Temple Region: mild Clavicle Bone Region: moderate Clavicle and Acromion Bone Region: moderate Scapular Bone Region: N/A Dorsal Hand: N/A Patellar Region: N/A Anterior Thigh Region: N/A Posterior Calf Region: N/A  Edema: none      Patient meets criteria for severe malnutrition in  the context of acute illness as evidenced by 9% weight loss in 1 month and </= 50% intake of estimated energy requirement for >/= 5 days.   Height: Ht Readings from Last 1 Encounters:  10/26/13 5\' 10"  (1.778 m)    Weight: Wt Readings from Last 1 Encounters:  10/26/13 169 lb 12.1 oz (77 kg)    Ideal Body Weight: 172 lb  % Ideal Body Weight: 98%  Wt Readings from Last 10 Encounters:  10/26/13 169 lb 12.1 oz (77 kg)  09/13/13 184 lb (83.462 kg)  05/16/13 182 lb (82.555 kg)  09/12/12 191 lb (86.637 kg)  03/14/12 189 lb 6.4 oz (85.911 kg)  01/05/12 191 lb (86.637 kg)  12/16/11 191 lb (86.637 kg)  01/07/11 192 lb 6.4 oz (87.272 kg)  01/06/10 193 lb 4 oz (87.658 kg)  11/18/09 184 lb (83.462 kg)    Usual Body Weight: 185 lb  % Usual Body Weight: 91%  BMI:  Body mass index is 24.36 kg/(m^2).  Estimated Nutritional Needs: Kcal: 2000-2200 Protein: 100-110 grams Fluid: 2.0-2.2 L  Skin: Intact  Diet Order: NPO  EDUCATION NEEDS: -No education needs identified at this time   Intake/Output Summary (Last 24 hours) at 10/26/13 0852 Last data filed at 10/26/13 0850  Gross per 24 hour  Intake 642.55 ml  Output    250 ml  Net 392.55 ml    Last BM: PTA  Labs:   Recent Labs Lab 10/25/13 1951 10/26/13 0233  NA 136* 141  K 3.5* 3.8  CL 99 105  CO2 22 23  BUN 14 13  CREATININE 1.04 0.97  CALCIUM 9.1 8.8  GLUCOSE 96  102*    CBG (last 3)  No results found for this basename: GLUCAP,  in the last 72 hours  Scheduled Meds: . aspirin EC  81 mg Oral Daily  . ezetimibe  10 mg Oral Daily  . hydrochlorothiazide  12.5 mg Oral Daily  . methyldopa  250 mg Oral BID  . metroNIDAZOLE  500 mg Oral TID  . niacin  1,000 mg Oral BID  . nitroGLYCERIN  1 inch Topical Q6H  . omega-3 acid ethyl esters  1,000 mg Oral BID  . ramipril  10 mg Oral BID  . sodium chloride  3 mL Intravenous Q12H  . verapamil  120 mg Oral BID    Continuous Infusions: . heparin 99,900 Units/hr  (10/26/13 0400)  . nitroGLYCERIN 10 mcg/min (10/26/13 7591)    Past Medical History  Diagnosis Date  . Bradycardia   . Hyperlipidemia   . HTN (hypertension)   . CAD (coronary artery disease) CABG 03/2000    Median sternotomy for coronary artery bypass grafting x 3 (left  . Aortic aneurysm Endograf 2009    Past Surgical History  Procedure Laterality Date  . Coronary artery bypass graft      AAA stent graft on 12/01/2007 at [facility]  coronary bypass in 2001. Dr. Roxy Manns.    Claudell Kyle, Dietetic Intern Pager: [phone]  I agree with the Student-Dietitian note and made appropriate revisions.  Arthur Holms, RD, LDN Pager #: [phone] After-Hours Pager #: [phone]

## 2013-10-26 NOTE — H&P (View-Only) (Signed)
Patient Name: [name] Brown Date of Encounter: 10/26/2013  Active Problems:   Unstable angina   Length of Stay: 1  SUBJECTIVE  The patient states that his chest pain resolved after initiation of NTG drip at midnight. He is not aware of being in atrial fibrillation and states that he has never been diagnosed with a-fib before. NPO.  CURRENT MEDS . aspirin EC  81 mg Oral Daily  . ezetimibe  10 mg Oral Daily  . hydrochlorothiazide  12.5 mg Oral Daily  . methyldopa  250 mg Oral BID  . metroNIDAZOLE  500 mg Oral TID  . niacin  1,000 mg Oral BID  . nitroGLYCERIN  1 inch Topical Q6H  . omega-3 acid ethyl esters  1,000 mg Oral BID  . ramipril  10 mg Oral BID  . sodium chloride  3 mL Intravenous Q12H  . verapamil  120 mg Oral BID   . heparin 99,900 Units/hr (10/26/13 0400)  . nitroGLYCERIN 10 mcg/min (10/26/13 0538)   OBJECTIVE  Filed Vitals:   10/26/13 0100 10/26/13 0346 10/26/13 0400 10/26/13 0832  BP: 108/72 114/69 115/67 108/59  Pulse: 88  61 63  Temp: 97.9 F (36.6 C)  97.9 F (36.6 C) 97.8 F (36.6 C)  TempSrc: Oral  Oral Oral  Resp:   15 12  Height: 5\' 10"  (1.778 m)     Weight: 169 lb 12.1 oz (77 kg)     SpO2: 96%  98% 98%    Intake/Output Summary (Last 24 hours) at 10/26/13 0857 Last data filed at 10/26/13 0850  Gross per 24 hour  Intake 642.55 ml  Output    250 ml  Net 392.55 ml   Filed Weights   10/25/13 2134 10/25/13 2334 10/26/13 0100  Weight: 182 lb 15.7 oz (83 kg) 174 lb 3.2 oz (79.017 kg) 169 lb 12.1 oz (77 kg)    PHYSICAL EXAM  General: Pleasant, NAD. Neuro: Alert and oriented X 3. Moves all extremities spontaneously. Psych: Normal affect. HEENT:  Normal  Neck: Supple without bruits or JVD. Lungs:  Resp regular and unlabored, CTA. Heart: RRR no s3, s4, or murmurs. Abdomen: Soft, non-tender, non-distended, BS + x 4.  Extremities: No clubbing, cyanosis or edema. DP/PT/Radials 2+ and equal bilaterally.  Accessory Clinical  Findings  CBC  Recent Labs  10/25/13 1951 10/26/13 0233  WBC 7.8 8.4  NEUTROABS 4.7  --   HGB 11.4* 11.2*  HCT 31.9* 32.6*  MCV 91.1 92.9  PLT 372 539   Basic Metabolic Panel  Recent Labs  10/25/13 1951 10/26/13 0233  NA 136* 141  K 3.5* 3.8  CL 99 105  CO2 22 23  GLUCOSE 96 102*  BUN 14 13  CREATININE 1.04 0.97  CALCIUM 9.1 8.8   Liver Function Tests No results found for this basename: AST, ALT, ALKPHOS, BILITOT, PROT, ALBUMIN,  in the last 72 hours No results found for this basename: LIPASE, AMYLASE,  in the last 72 hours Cardiac Enzymes  Recent Labs  10/25/13 0004 10/26/13 0530  TROPONINI 0.75* 0.95*    Radiology/Studies  Dg Chest 2 View  10/25/2013   CLINICAL DATA:  Chest pain, no shortness of breath.    IMPRESSION: Left lower lobe atelectasis, and a background of mild chronic interstitial changes/COPD.   Electronically Signed   By: Elon Alas   On: 10/25/2013 20:36    TELE  A-fib rate controlled since the admission to this unit   ECG  From ER yesterday :  SR, new STD in the anterior leads (when compared to the ECG from 09/13/2013)    ASSESSMENT AND PLAN  63 y.o. male w/ PMHx significant for CAD s/p CABG in 2001 (3v LIMA to LAD, RIMA to PDA, L radial to OM1, s/p stent 2008, AAA s/p EVAR 2009 who presented to [facility] on 10/25/2013 with complaints of chest pain. Describes it as a pressure, radiates to right arm and left neck. Described as a heaviness. Has not needed nitro in over 20 yrs. Has had several episodes over the last 36 hours and they are increasing in severity prompting EMS call today after the pain was significantly worse. No nausea, vomiting, diaphoresis, SOB or dizziness. Have occurred at rest, once waking him from sleep. Has not noted any exertional symptoms recently.  1. NSTEMI - elevated and uptrending troponin, typical chest pain, new ischemia in the anterior leads. On iv Heparin, iv NTG drip, ASA and ramipril. He is  adamant about not taking ANY statins. He is on Verapamil for migraines and bradycardiac. We will add minimal dose BB. He is scheduled for a cath today.  2. New onset atrial fibrillation - in SR in the ER at 11 pm and in a-fib since on tele at 2 am in the unit. He states he has never had a-fib before. Currently on iv Heparin for NSTEMI. Anticoagulation and therapy will need to be addressed post cath.   3. Hyperlidemia - the patient developed myalgias after lipitor, crestor and red rice yeast, he absolutely refuses to take weaker statins.  4. HTN - controlled  Signed, Ena Dawley, H MD, [facility] 10/26/2013

## 2013-10-26 NOTE — ED Provider Notes (Signed)
Medical screening examination/treatment/procedure(s) were conducted as a shared visit with non-physician practitioner(s) and myself.  I personally evaluated the patient during the encounter.  EKG Interpretation    Date/Time:    Ventricular Rate:    PR Interval:    QRS Duration:   QT Interval:    QTC Calculation:   R Axis:     Text Interpretation:              Patient here with chest pain. Central L pressure with radiation. Hx of CAD, aortic aneurysm. No chest pain here. Jovita Gamma, MD 10/26/13 [phone]

## 2013-10-26 NOTE — Progress Notes (Addendum)
Patient Name: [name] Brown Date of Encounter: 10/26/2013  Active Problems:   Unstable angina   Length of Stay: 1  SUBJECTIVE  The patient states that his chest pain resolved after initiation of NTG drip at midnight. He is not aware of being in atrial fibrillation and states that he has never been diagnosed with a-fib before. NPO.  CURRENT MEDS . aspirin EC  81 mg Oral Daily  . ezetimibe  10 mg Oral Daily  . hydrochlorothiazide  12.5 mg Oral Daily  . methyldopa  250 mg Oral BID  . metroNIDAZOLE  500 mg Oral TID  . niacin  1,000 mg Oral BID  . nitroGLYCERIN  1 inch Topical Q6H  . omega-3 acid ethyl esters  1,000 mg Oral BID  . ramipril  10 mg Oral BID  . sodium chloride  3 mL Intravenous Q12H  . verapamil  120 mg Oral BID   . heparin 99,900 Units/hr (10/26/13 0400)  . nitroGLYCERIN 10 mcg/min (10/26/13 0538)   OBJECTIVE  Filed Vitals:   10/26/13 0100 10/26/13 0346 10/26/13 0400 10/26/13 0832  BP: 108/72 114/69 115/67 108/59  Pulse: 88  61 63  Temp: 97.9 F (36.6 C)  97.9 F (36.6 C) 97.8 F (36.6 C)  TempSrc: Oral  Oral Oral  Resp:   15 12  Height: 5\' 10"  (1.778 m)     Weight: 169 lb 12.1 oz (77 kg)     SpO2: 96%  98% 98%    Intake/Output Summary (Last 24 hours) at 10/26/13 0857 Last data filed at 10/26/13 0850  Gross per 24 hour  Intake 642.55 ml  Output    250 ml  Net 392.55 ml   Filed Weights   10/25/13 2134 10/25/13 2334 10/26/13 0100  Weight: 182 lb 15.7 oz (83 kg) 174 lb 3.2 oz (79.017 kg) 169 lb 12.1 oz (77 kg)    PHYSICAL EXAM  General: Pleasant, NAD. Neuro: Alert and oriented X 3. Moves all extremities spontaneously. Psych: Normal affect. HEENT:  Normal  Neck: Supple without bruits or JVD. Lungs:  Resp regular and unlabored, CTA. Heart: RRR no s3, s4, or murmurs. Abdomen: Soft, non-tender, non-distended, BS + x 4.  Extremities: No clubbing, cyanosis or edema. DP/PT/Radials 2+ and equal bilaterally.  Accessory Clinical  Findings  CBC  Recent Labs  10/25/13 1951 10/26/13 0233  WBC 7.8 8.4  NEUTROABS 4.7  --   HGB 11.4* 11.2*  HCT 31.9* 32.6*  MCV 91.1 92.9  PLT 372 539   Basic Metabolic Panel  Recent Labs  10/25/13 1951 10/26/13 0233  NA 136* 141  K 3.5* 3.8  CL 99 105  CO2 22 23  GLUCOSE 96 102*  BUN 14 13  CREATININE 1.04 0.97  CALCIUM 9.1 8.8   Liver Function Tests No results found for this basename: AST, ALT, ALKPHOS, BILITOT, PROT, ALBUMIN,  in the last 72 hours No results found for this basename: LIPASE, AMYLASE,  in the last 72 hours Cardiac Enzymes  Recent Labs  10/25/13 0004 10/26/13 0530  TROPONINI 0.75* 0.95*    Radiology/Studies  Dg Chest 2 View  10/25/2013   CLINICAL DATA:  Chest pain, no shortness of breath.    IMPRESSION: Left lower lobe atelectasis, and a background of mild chronic interstitial changes/COPD.   Electronically Signed   By: Elon Alas   On: 10/25/2013 20:36    TELE  A-fib rate controlled since the admission to this unit   ECG  From ER yesterday :  SR, new STD in the anterior leads (when compared to the ECG from 09/13/2013)    ASSESSMENT AND PLAN  63 y.o. male w/ PMHx significant for CAD s/p CABG in 2001 (3v LIMA to LAD, RIMA to PDA, L radial to OM1, s/p stent 2008, AAA s/p EVAR 2009 who presented to [facility] on 10/25/2013 with complaints of chest pain. Describes it as a pressure, radiates to right arm and left neck. Described as a heaviness. Has not needed nitro in over 20 yrs. Has had several episodes over the last 36 hours and they are increasing in severity prompting EMS call today after the pain was significantly worse. No nausea, vomiting, diaphoresis, SOB or dizziness. Have occurred at rest, once waking him from sleep. Has not noted any exertional symptoms recently.  1. NSTEMI - elevated and uptrending troponin, typical chest pain, new ischemia in the anterior leads. On iv Heparin, iv NTG drip, ASA and ramipril. He is  adamant about not taking ANY statins. He is on Verapamil for migraines and bradycardiac. We will add minimal dose BB. He is scheduled for a cath today.  2. New onset atrial fibrillation - in SR in the ER at 11 pm and in a-fib since on tele at 2 am in the unit. He states he has never had a-fib before. Currently on iv Heparin for NSTEMI. Anticoagulation and therapy will need to be addressed post cath.   3. Hyperlidemia - the patient developed myalgias after lipitor, crestor and red rice yeast, he absolutely refuses to take weaker statins.  4. HTN - controlled  Signed, Chyenne Sobczak, H MD, FACC 10/26/2013   

## 2013-10-26 NOTE — CV Procedure (Signed)
   Cardiac Catheterization Procedure Note  Name: [name] Brown MRN: [id number] DOB: 18-Jun-1951  Procedure: Left Heart Cath, Selective Coronary Angiography, Free radial graft angiography, LIMA angiography, RIMA angiography, LV angiography  Indication: 64 yo WM with history of CAD s/p CABG in 2001 presents with a NSTEMI.   Procedural details: The right groin was prepped, draped, and anesthetized with 1% lidocaine. Using modified Seldinger technique, a 5 French sheath was introduced into the right femoral artery. Standard Judkins catheters were used for coronary angiography and left ventriculography. Catheter exchanges were performed over a guidewire. There were no immediate procedural complications. The patient was transferred to the post catheterization recovery area for further monitoring.  Procedural Findings: Hemodynamics:  AO 102/55 mean 72 mm Hg LV 107/14 mm Hg   Coronary angiography: Coronary dominance: right  Left mainstem: Heavily calcified and tortuous. 40% distal stenosis.  Left anterior descending (LAD): Very complex proximal disease up to 95%. Heavy calcification. The LAD is occluded after the first diagonal and first septal perforator. The first diagonal is a moderate sized vessel with 40% disease.  Left circumflex (LCx):  The left circumflex is calcified. There is 30-40% disease in the mid vessel. The first OM appears small and is occluded. The second OM is tiny. The terminal OM has segmental 50-60% disease.  Right coronary artery (RCA): 100% proximal occlusion.  The free radial graft to the OM is 100% occluded at the ostium.  The RIMA graft to the RCA is a large graft and widely patent.  The LIMA graft to the LAD is a large graft and is widely patent. It gives collaterals to an RV marginal branch.  Left ventriculography: Left ventricular systolic function is normal, LVEF is estimated at 50-55%,  There is hypokinesis of the mid anterior wall, there is no significant  mitral regurgitation   Final Conclusions:   1. Severe 3 vessel occlusive CAD 2. Patent LIMA to the LAD 3. Patent RIMA to the RCA 4. Occluded free radial graft to the OM 5. Good LV function.  Recommendations: Medical management.   Collier Salina [facility] 10/26/2013, 12:49 PM

## 2013-10-27 DIAGNOSIS — I214 Non-ST elevation (NSTEMI) myocardial infarction: Secondary | ICD-10-CM

## 2013-10-27 DIAGNOSIS — I369 Nonrheumatic tricuspid valve disorder, unspecified: Secondary | ICD-10-CM

## 2013-10-27 DIAGNOSIS — I2510 Atherosclerotic heart disease of native coronary artery without angina pectoris: Secondary | ICD-10-CM

## 2013-10-27 LAB — CBC
HCT: 33.9 % — ABNORMAL LOW (ref 39.0–52.0)
Hemoglobin: 11.4 g/dL — ABNORMAL LOW (ref 13.0–17.0)
MCH: 31.8 pg (ref 26.0–34.0)
MCHC: 33.6 g/dL (ref 30.0–36.0)
MCV: 94.7 fL (ref 78.0–100.0)
Platelets: 361 10*3/uL (ref 150–400)
RBC: 3.58 MIL/uL — ABNORMAL LOW (ref 4.22–5.81)
RDW: 14 % (ref 11.5–15.5)
WBC: 6.6 10*3/uL (ref 4.0–10.5)

## 2013-10-27 LAB — HEPARIN LEVEL (UNFRACTIONATED)
Heparin Unfractionated: 0.19 IU/mL — ABNORMAL LOW (ref 0.30–0.70)
Heparin Unfractionated: 0.41 IU/mL (ref 0.30–0.70)
Heparin Unfractionated: 0.56 IU/mL (ref 0.30–0.70)

## 2013-10-27 MED ORDER — PROMETHAZINE HCL 25 MG RE SUPP
25.0000 mg | Freq: Once | RECTAL | Status: AC
  Administered 2013-10-27: 25 mg via RECTAL
  Filled 2013-10-27: qty 1

## 2013-10-27 MED ORDER — CLOPIDOGREL BISULFATE 75 MG PO TABS
75.0000 mg | ORAL_TABLET | Freq: Every day | ORAL | Status: DC
  Administered 2013-10-28 – 2013-10-29 (×2): 75 mg via ORAL
  Filled 2013-10-27 (×3): qty 1

## 2013-10-27 NOTE — Progress Notes (Signed)
CARDIAC REHAB PHASE I   I educated the patient and the patient's wife.  I gave him an exercise prescription, explained how to use his nitro, explained restrictions, nutrition, and oriented him to phase 2 cardiac rehab.  Patient states that he can not attend cardiac rehab because he works in [place] everyday from 7am-3:30pm.  Patient was given information and encouraged to contact us in the future if situation ever changes.    Vonita Moss [place], [place] 10/27/2013 2:18 PM

## 2013-10-27 NOTE — Progress Notes (Signed)
Pt states that he had "a twinge" of chest pain when up to BR, resolved quickly, none at present.

## 2013-10-27 NOTE — Progress Notes (Signed)
  Echocardiogram 2D Echocardiogram has been performed.  [name] Brown 10/27/2013, 3:12 PM

## 2013-10-27 NOTE — Progress Notes (Signed)
10/27/2013 1130 Nursing note Dr. Domenic Polite paged and made aware of pt. Complaints of chest pain during ambulation this am and relief with SL NTG x 2. Pt. Currently stating CP completely relieved at this time. Pt. Instructed to promptly notify RN of any recurrence of CP. Pt. Verbalized understanding. Will continue to monitor patient.  [name] Brown, [name]

## 2013-10-27 NOTE — Progress Notes (Signed)
Pt given 2nd SL nitro at this time; CP still present, but pt reports pain is improving; VSS; will cont. To monitor.

## 2013-10-27 NOTE — Progress Notes (Signed)
CARDIAC REHAB PHASE I   PRE:  Rate/Rhythm: 65 sinus rhythm  BP:  Supine:   Sitting: 124/60  Standing:    SaO2: 95% ra  MODE:  Ambulation: 550 ft   POST:  Rate/Rhythem: 75 sinus rhythm  BP:  Supine:   Sitting: 158/80  Standing:    SaO2: 95% ra 1015-1050 Pt ambulated in hallway x1 assist, steady gait. Pt returned to bed, call light in reach.   Immediately post exercise pt c/o chest pain rates 1-2/10.  Pain worsened to 4/10 with radiation to left arm.  Pt describes pain as his anginal equivalent.  RN made aware. O2-2L via Nasal cannual applied.   Pt given NTG SL x2 by RN.  Pain resolved.  Pt wife at bedside.  Education deferred at this time.  Written materials given.  [place], [place]

## 2013-10-27 NOTE — Progress Notes (Signed)
10/27/2013 5:00 PM Nursing note Pt. C/o persistant nausea today despite administration of PRN Zofran at 1438. Jory Sims NP paged and made aware of pt. Concerns. Verbal orders received for Phenergan 25 mg suppository x 1. Orders enacted. Pt. Updated on plan. Will continue to monitor patient.  Derrik Mceachern, [name]

## 2013-10-27 NOTE — Progress Notes (Signed)
Primary cardiologist: Dr. Jenkins Rouge  Subjective:    No chest pain this morning, no palpitations or shortness of breath. Has not ambulated.  Objective:   Temp:  [97.8 F (36.6 C)-97.9 F (36.6 C)] 97.8 F (36.6 C) (01/17 0410) Pulse Rate:  [56-70] 57 (01/17 0410) Resp:  [16-18] 17 (01/17 0410) BP: (96-142)/(53-76) 104/53 mmHg (01/17 0410) SpO2:  [93 %-96 %] 93 % (01/17 0410) Last BM Date: 10/27/13  Filed Weights   10/25/13 2134 10/25/13 2334 10/26/13 0100  Weight: 182 lb 15.7 oz (83 kg) 174 lb 3.2 oz (79.017 kg) 169 lb 12.1 oz (77 kg)   No intake or output data in the 24 hours ending 10/27/13 0932  Telemetry: Sinus rhythm.  Exam:  General: In no distress.  Lungs: Clear, nonlabored.  Cardiac: RRR, no gallop.  Extremities: No pitting edema.   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   Medications:   Scheduled Medications: . aspirin EC  81 mg Oral Daily  . carvedilol  3.125 mg Oral BID WC  . ezetimibe  10 mg Oral Daily  . hydrochlorothiazide  12.5 mg Oral Daily  . methyldopa  250 mg Oral BID  . metroNIDAZOLE  500 mg Oral TID  . niacin  1,000 mg Oral BID  . omega-3 acid ethyl esters  1,000 mg Oral BID  . ramipril  10 mg Oral BID  . sodium chloride  3 mL Intravenous Q12H  . verapamil  120 mg Oral BID     Infusions: . heparin 1,800 Units/hr (10/27/13 0528)     PRN Medications:  sodium chloride,  acetaminophen, diphenhydrAMINE, nitroGLYCERIN, ondansetron (ZOFRAN) IV, sodium chloride   Assessment:   1. NSTEMI, peak troponin I 0.95. Cardiac catheterization this admission shows severe native vessel CAD with patent LIMA to the LAD, patent RIMA to the RCA, and occluded free radial to the obtuse marginal hthat is to be managed medically. LVEF estimated at 50-55% with mid anterior hypokinesis and no significant mitral regurgitation.  2. Known multivessel CAD status post CABG (LIMA to LAD, RIMA to PDA, L radial to OM1)  3. Hyperlipidemia with history of statin intolerance. Currently on Zetia and Niacin CR.  4. Transient atrial fibrillation, rate controlled. CHADSVASC score 2. Currently in normal sinus rhythm.  5. History of bradycardia. He is on verapamil for treatment of cluster headaches as an outpatient.   Plan/Discussion:    Will treat with aspirin and Plavix at this time with recent NSTEMI. With relatively low thromboembolic risk, would not initiate oral anticoagulant unless recurring atrial fibrillation becomes a problem. Also plan to stop Coreg since he has history of bradycardia and requires outpatient treatment with verapamil. Has statin intolerance as already discussed. Will continue heparin for 24-48 hour course. Increase ambulation. Anticipate discharge in the next 24-48 hours.   Satira Sark, M.D.,  F.A.C.C.

## 2013-10-27 NOTE — Progress Notes (Signed)
ANTICOAGULATION CONSULT NOTE - Follow Up Consult  Pharmacy Consult for heparin Indication: 3V CAD  Labs:  Recent Labs  10/25/13 0004  10/25/13 1951 10/26/13 0233 10/26/13 0530 10/26/13 0945 10/26/13 1120 10/27/13 0400  HGB  --   < > 11.4* 11.2*  --   --   --  11.4*  HCT  --   --  31.9* 32.6*  --   --   --  33.9*  PLT  --   --  372 373  --   --   --  361  HEPARINUNFRC  --   --   --  <0.10*  --  <0.10*  --  0.19*  CREATININE  --   --  1.04 0.97  --   --   --   --   TROPONINI 0.75*  --   --   --  0.95*  --  0.52*  --   < > = values in this interval not displayed.   Assessment: 63yo male subtherapeutic on heparin after resumed post-cath for medical management of 3V CAD.  Goal of Therapy:  Heparin level 0.3-0.7 units/ml   Plan:  Will increase heparin gtt by 2-3 units/kg/hr to 1800 units/hr and check level in 6hr.  Wynona Neat, PharmD, BCPS  10/27/2013,5:21 AM

## 2013-10-27 NOTE — Progress Notes (Signed)
ANTICOAGULATION CONSULT NOTE - Follow-up  Pharmacy Consult for heparin Indication: chest pain/ACS with 3V disease  Allergies  Allergen Reactions  . Statins     Myalgias with elevated CPKs    Patient Measurements: Height: 5\' 10"  (177.8 cm) Weight: 169 lb 12.1 oz (77 kg) IBW/kg (Calculated) : 73 Heparin Dosing Weight: 80kg  Vital Signs: Temp: 97.8 F (36.6 C) (01/17 0410) Temp src: Oral (01/17 0410) BP: 128/78 mmHg (01/17 1040) Pulse Rate: 77 (01/17 1040)  Labs:  Recent Labs  10/25/13 0004  10/25/13 1951  10/26/13 0233 10/26/13 0530 10/26/13 0945 10/26/13 1120 10/27/13 0400 10/27/13 1136  HGB  --   < > 11.4*  --  11.2*  --   --   --  11.4*  --   HCT  --   --  31.9*  --  32.6*  --   --   --  33.9*  --   PLT  --   --  372  --  373  --   --   --  361  --   HEPARINUNFRC  --   --   --   < > <0.10*  --  <0.10*  --  0.19* 0.41  CREATININE  --   --  1.04  --  0.97  --   --   --   --   --   TROPONINI 0.75*  --   --   --   --  0.95*  --  0.52*  --   --   < > = values in this interval not displayed.  Estimated Creatinine Clearance: 82.6 ml/min (by C-G formula based on Cr of 0.97).  Assessment: 63 year old male admitted to [facility] with chest pain. Significant past medical history of CAD with CABG in past stent. S/p cardiac cath which showed 3V CAD. Heparin was restarted 8 hours post-sheath removal. First level low and rate was increased to 1800 units/hr this morning. Now HL is therapeutic at 0.41. CBC stable, no bleeding noted. Note cardiology plans to continue heparin for 24-48 hours.  Goal of Therapy:  Heparin level 0.3-0.7 units/ml Monitor platelets by anticoagulation protocol: Yes   Plan:  1. Continue heparin at 1800 units/hr 2. 6 hour HL to confirm 3. Daily HL and CBC 4. Follow for definite long term AC plans- appears as though cards is not planning to d/c on [facility].  Takera Rayl D. Jahnay Lantier, PharmD, BCPS Clinical Pharmacist Pager: [phone] 10/27/2013 1:36 PM

## 2013-10-27 NOTE — Progress Notes (Signed)
Pt c/o chest pain 2/10 after ambulation with cardiac rehab; pt given 1 SL nitro at this time; will cont. To monitor.

## 2013-10-27 NOTE — Progress Notes (Signed)
ANTICOAGULATION CONSULT NOTE - Follow-up  Pharmacy Consult for heparin Indication: chest pain/ACS with 3V disease  Allergies  Allergen Reactions  . Statins     Myalgias with elevated CPKs    Patient Measurements: Height: 5\' 10"  (177.8 cm) Weight: 169 lb 12.1 oz (77 kg) IBW/kg (Calculated) : 73 Heparin Dosing Weight: 80kg  Vital Signs: Temp: 98.4 F (36.9 C) (01/17 1506) Temp src: Oral (01/17 1506) BP: 115/54 mmHg (01/17 1506) Pulse Rate: 61 (01/17 1506)  Labs:  Recent Labs  10/25/13 0004  10/25/13 1951 10/26/13 0233 10/26/13 0530  10/26/13 1120 10/27/13 0400 10/27/13 1136 10/27/13 1745  HGB  --   < > 11.4* 11.2*  --   --   --  11.4*  --   --   HCT  --   --  31.9* 32.6*  --   --   --  33.9*  --   --   PLT  --   --  372 373  --   --   --  361  --   --   HEPARINUNFRC  --   --   --  <0.10*  --   < >  --  0.19* 0.41 0.56  CREATININE  --   --  1.04 0.97  --   --   --   --   --   --   TROPONINI 0.75*  --   --   --  0.95*  --  0.52*  --   --   --   < > = values in this interval not displayed.  Estimated Creatinine Clearance: 82.6 ml/min (by C-G formula based on Cr of 0.97).  Assessment: 63 year old male admitted to [facility] with chest pain. Significant past medical history of CAD with CABG in past stent. S/p cardiac cath which showed 3V CAD. Heparin is therapeutic x2 on 1800 units/hr. CBC stable, no bleeding noted. Note cardiology plans to continue heparin for 24-48 hours.  Goal of Therapy:  Heparin level 0.3-0.7 units/ml Monitor platelets by anticoagulation protocol: Yes   Plan:  1. Continue heparin at 1800 units/hr 2. Daily HL and CBC  Savi Lastinger D. Correll Denbow, PharmD, BCPS Clinical Pharmacist Pager: [phone] 10/27/2013 6:37 PM

## 2013-10-28 LAB — CBC
HCT: 33.5 % — ABNORMAL LOW (ref 39.0–52.0)
HEMOGLOBIN: 11.6 g/dL — AB (ref 13.0–17.0)
MCH: 32.3 pg (ref 26.0–34.0)
MCHC: 34.6 g/dL (ref 30.0–36.0)
MCV: 93.3 fL (ref 78.0–100.0)
Platelets: 332 10*3/uL (ref 150–400)
RBC: 3.59 MIL/uL — ABNORMAL LOW (ref 4.22–5.81)
RDW: 14 % (ref 11.5–15.5)
WBC: 8.3 10*3/uL (ref 4.0–10.5)

## 2013-10-28 LAB — BASIC METABOLIC PANEL
BUN: 8 mg/dL (ref 6–23)
CALCIUM: 8.5 mg/dL (ref 8.4–10.5)
CO2: 21 mEq/L (ref 19–32)
CREATININE: 0.84 mg/dL (ref 0.50–1.35)
Chloride: 101 mEq/L (ref 96–112)
GFR calc Af Amer: >90 mL/min (ref >90)
GFR calc non Af Amer: >90 mL/min (ref >90)
Glucose, Bld: 94 mg/dL (ref 70–99)
Potassium: 3.1 mEq/L — ABNORMAL LOW (ref 3.7–5.3)
Sodium: 139 mEq/L (ref 137–147)

## 2013-10-28 LAB — HEPARIN LEVEL (UNFRACTIONATED): Heparin Unfractionated: 0.51 IU/mL (ref 0.30–0.70)

## 2013-10-28 MED ORDER — ISOSORBIDE MONONITRATE ER 30 MG PO TB24
30.0000 mg | ORAL_TABLET | Freq: Every day | ORAL | Status: DC
  Administered 2013-10-28 – 2013-10-29 (×2): 30 mg via ORAL
  Filled 2013-10-28 (×2): qty 1

## 2013-10-28 NOTE — Progress Notes (Signed)
ANTICOAGULATION CONSULT NOTE - Follow-up  Pharmacy Consult for heparin Indication: chest pain/ACS with 3V disease  Allergies  Allergen Reactions  . Statins     Myalgias with elevated CPKs    Patient Measurements: Height: 5\' 10"  (177.8 cm) Weight: 169 lb 12.1 oz (77 kg) IBW/kg (Calculated) : 73 Heparin Dosing Weight: 80kg  Vital Signs: Temp: 98.4 F (36.9 C) (01/18 0511) Temp src: Oral (01/18 0511) BP: 107/54 mmHg (01/18 0511) Pulse Rate: 65 (01/18 0511)  Labs:  Recent Labs  10/25/13 1951 10/26/13 0233 10/26/13 0530  10/26/13 1120 10/27/13 0400 10/27/13 1136 10/27/13 1745 10/28/13 0500  HGB 11.4* 11.2*  --   --   --  11.4*  --   --  11.6*  HCT 31.9* 32.6*  --   --   --  33.9*  --   --  33.5*  PLT 372 373  --   --   --  361  --   --  332  HEPARINUNFRC  --  <0.10*  --   < >  --  0.19* 0.41 0.56 0.51  CREATININE 1.04 0.97  --   --   --   --   --   --  0.84  TROPONINI  --   --  0.95*  --  0.52*  --   --   --   --   < > = values in this interval not displayed.  Estimated Creatinine Clearance: 95.4 ml/min (by C-G formula based on Cr of 0.84).  Assessment: 63 year old male admitted to [facility] with chest pain. Significant past medical history of CAD with CABG in past stent. S/p cardiac cath which showed 3V CAD. Heparin remains at goal on 1800 units/hr. CBC stable, no bleeding noted. Note cardiology has entered a stop date for the heparin tomorrow at 0600.  Goal of Therapy:  Heparin level 0.3-0.7 units/ml Monitor platelets by anticoagulation protocol: Yes   Plan:  1. Continue heparin at 1800 units/hr until 1/19 at 0600 2. Daily HL and CBC has been discontinued 3. Pharmacy to sign off. Please reconsult if needed.  Mccartney Brucks D. Siri Buege, PharmD, BCPS Clinical Pharmacist Pager: [phone] 10/28/2013 11:10 AM

## 2013-10-28 NOTE — Progress Notes (Signed)
Primary cardiologist: Dr. Jenkins Rouge  Subjective:    Has had some recurrent episodes of exertional chest pain. Also tells me that he has been struggling with recurrent diarrhea since December 18, abdominal cramping, some postprandial nausea his which has occurred again today. He saw Dr. Amedeo Plenty with [facility] - no diagnosis yet.  Objective:   Temp:  [98.3 F (36.8 C)-98.4 F (36.9 C)] 98.4 F (36.9 C) (01/18 0511) Pulse Rate:  [61-77] 65 (01/18 0511) Resp:  [18] 18 (01/18 0511) BP: (107-176)/(54-80) 107/54 mmHg (01/18 0511) SpO2:  [92 %-97 %] 94 % (01/18 0511) Last BM Date: 10/27/13  Filed Weights   10/25/13 2134 10/25/13 2334 10/26/13 0100  Weight: 182 lb 15.7 oz (83 kg) 174 lb 3.2 oz (79.017 kg) 169 lb 12.1 oz (77 kg)    Intake/Output Summary (Last 24 hours) at 10/28/13 0832 Last data filed at 10/28/13 0600  Gross per 24 hour  Intake    456 ml  Output      0 ml  Net    456 ml    Telemetry: Sinus rhythm.  Exam:  General: In no distress.  Lungs: Clear, nonlabored.  Cardiac: RRR, no gallop.  Extremities: No pitting edema.   Lab Results:  Basic Metabolic Panel:  Recent Labs Lab 10/25/13 1951 10/26/13 0233 10/28/13 0500  NA 136* 141 139  K 3.5* 3.8 3.1*  CL 99 105 101  CO2 22 23 21   GLUCOSE 96 102* 94  BUN 14 13 8   CREATININE 1.04 0.97 0.84  CALCIUM 9.1 8.8 8.5    CBC:  Recent Labs Lab 10/26/13 0233 10/27/13 0400 10/28/13 0500  WBC 8.4 6.6 8.3  HGB 11.2* 11.4* 11.6*  HCT 32.6* 33.9* 33.5*  MCV 92.9 94.7 93.3  PLT 373 361 332    Cardiac Enzymes:  Recent Labs Lab 10/25/13 0004 10/26/13 0530 10/26/13 1120  TROPONINI 0.75* 0.95* 0.52*    Echocardiogram (1/17): Study Conclusions  - Left ventricle: The cavity size was normal. There was mild concentric hypertrophy. Systolic function was normal. The estimated ejection fraction was in the range of 55% to 60%. Wall motion was normal; there were no regional wall motion  abnormalities. - Aortic valve: Trivial regurgitation. - Left atrium: The atrium was severely dilated. - Right atrium: The atrium was moderately dilated. - Atrial septum: No defect or patent foramen ovale was identified. - Pulmonary arteries: PA peak pressure: 65mm Hg (S).   Medications:   Scheduled Medications: . aspirin EC  81 mg Oral Daily  . clopidogrel  75 mg Oral Q breakfast  . ezetimibe  10 mg Oral Daily  . hydrochlorothiazide  12.5 mg Oral Daily  . methyldopa  250 mg Oral BID  . metroNIDAZOLE  500 mg Oral TID  . niacin  1,000 mg Oral BID  . omega-3 acid ethyl esters  1,000 mg Oral BID  . ramipril  10 mg Oral BID  . sodium chloride  3 mL Intravenous Q12H  . verapamil  120 mg Oral BID    Infusions: . heparin 1,800 Units/hr (10/28/13 0034)    PRN Medications: sodium chloride, acetaminophen, diphenhydrAMINE, nitroGLYCERIN, ondansetron (ZOFRAN) IV, sodium chloride   Assessment:   1. NSTEMI, peak troponin I 0.95. Cardiac catheterization this admission shows severe native vessel CAD with patent LIMA to the LAD, patent RIMA to the RCA, and occluded free radial to the obtuse marginal hthat is to be managed medically. LVEF 55% without regional wall motion abnormalities by echocardiogram.  2.  Known multivessel CAD status post CABG (LIMA to LAD, RIMA to PDA, L radial to OM1)  3. Hyperlipidemia with history of statin intolerance. Currently on Zetia and Niacin CR.  4. Transient atrial fibrillation. CHADSVASC score 2. Currently in normal sinus rhythm.  5. History of bradycardia. He is on verapamil for treatment of cluster headaches as an outpatient.  6. Diarrhea since December 18 with abdominal cramping and some postprandial nausea. Has seen Dr. Amedeo Plenty with [facility]. Still having symptoms, no diagnosis as yet. He requests to be seen again in house.  Plan/Discussion:    Continue aspirin and Plavix at this time with recent NSTEMI. With relatively low thromboembolic risk, would  not initiate oral anticoagulant unless recurring atrial fibrillation becomes a problem. No addition of beta blocker with history of bradycardia and also requires outpatient treatment with verapamil for cluster headache. Has statin intolerance as already discussed. Will plan to stop heparin tomorrow morning. Also adding Imdur 30 mg daily. Not yet ready for discharge.   Satira Sark, M.D., F.A.C.C.

## 2013-10-29 DIAGNOSIS — G43909 Migraine, unspecified, not intractable, without status migrainosus: Secondary | ICD-10-CM | POA: Diagnosis present

## 2013-10-29 DIAGNOSIS — F411 Generalized anxiety disorder: Secondary | ICD-10-CM

## 2013-10-29 DIAGNOSIS — E43 Unspecified severe protein-calorie malnutrition: Secondary | ICD-10-CM | POA: Diagnosis present

## 2013-10-29 DIAGNOSIS — I441 Atrioventricular block, second degree: Secondary | ICD-10-CM | POA: Diagnosis not present

## 2013-10-29 DIAGNOSIS — R079 Chest pain, unspecified: Secondary | ICD-10-CM

## 2013-10-29 LAB — BASIC METABOLIC PANEL
BUN: 8 mg/dL (ref 6–23)
CO2: 22 meq/L (ref 19–32)
CREATININE: 0.84 mg/dL (ref 0.50–1.35)
Calcium: 8.4 mg/dL (ref 8.4–10.5)
Chloride: 101 mEq/L (ref 96–112)
GFR calc Af Amer: >90 mL/min (ref >90)
GFR calc non Af Amer: >90 mL/min (ref >90)
Glucose, Bld: 92 mg/dL (ref 70–99)
Potassium: 3.3 mEq/L — ABNORMAL LOW (ref 3.7–5.3)
Sodium: 139 mEq/L (ref 137–147)

## 2013-10-29 MED ORDER — POTASSIUM CHLORIDE CRYS ER 20 MEQ PO TBCR: 20.0000 meq | EXTENDED_RELEASE_TABLET | Freq: Every day | ORAL | Status: DC

## 2013-10-29 MED ORDER — VERAPAMIL HCL 120 MG PO TABS: 120.0000 mg | ORAL_TABLET | Freq: Every day | ORAL | Status: DC

## 2013-10-29 MED ORDER — ISOSORBIDE MONONITRATE ER 30 MG PO TB24: 30.0000 mg | ORAL_TABLET | Freq: Every day | ORAL | Status: DC

## 2013-10-29 MED ORDER — VERAPAMIL HCL 120 MG PO TABS
120.0000 mg | ORAL_TABLET | Freq: Once | ORAL | Status: AC
  Administered 2013-10-29: 120 mg via ORAL
  Filled 2013-10-29: qty 1

## 2013-10-29 MED ORDER — CILIDINIUM-CHLORDIAZEPOXIDE 2.5-5 MG PO CAPS: 1.0000 | ORAL_CAPSULE | Freq: Three times a day (TID) | ORAL | Status: DC

## 2013-10-29 MED ORDER — ASPIRIN 81 MG PO TBEC: 81.0000 mg | DELAYED_RELEASE_TABLET | Freq: Every day | ORAL | Status: AC

## 2013-10-29 MED ORDER — POTASSIUM CHLORIDE CRYS ER 20 MEQ PO TBCR
40.0000 meq | EXTENDED_RELEASE_TABLET | Freq: Once | ORAL | Status: AC
  Administered 2013-10-29: 40 meq via ORAL
  Filled 2013-10-29: qty 2

## 2013-10-29 MED ORDER — NITROGLYCERIN 0.4 MG SL SUBL: 0.4000 mg | SUBLINGUAL_TABLET | SUBLINGUAL | Status: DC | PRN

## 2013-10-29 MED ORDER — ACETAMINOPHEN 325 MG PO TABS: 650.0000 mg | ORAL_TABLET | ORAL | Status: DC | PRN

## 2013-10-29 MED ORDER — CLOPIDOGREL BISULFATE 75 MG PO TABS: 75.0000 mg | ORAL_TABLET | Freq: Every day | ORAL | Status: DC

## 2013-10-29 NOTE — Progress Notes (Signed)
0940 Cardiac Rehab Pt just back from a walk. He denies any cp or SOB. His only c/o is of abd. pain and diarrhea. We will follow pt later. Deon Pilling, RN 10/29/2013 9:57 AM

## 2013-10-29 NOTE — Discharge Summary (Addendum)
Patient ID: [name] Brown,  MRN: [id number], DOB/AGE: 13-Feb-1951 63 y.o.  Admit date: 10/25/2013 Discharge date: 10/29/2013  Primary Care Provider:  Primary Cardiologist: Dr Johnsie Cancel  Discharge Diagnoses Principal Problem:   Unstable angina Active Problems:   CAD- CABG '08,occluded RA-OM graft 10/26/13   NSTEMI - Troponin 0.9   Second degree AVB   Paroxysmal atrial fibrillation   Protein-calorie malnutrition, severe   HYPERLIPIDEMIA- history of statin intol   HYPERTENSION   AORTIC ANEURYSM- s/p endograft '09   Carotid bruit- 83-41% LICA by doppler 9/62    Procedures:  Cath 10/26/13                         Echo 10/27/13   Hospital Course :   The pt is a 63 y.o. male w/ PMHx significant for CAD s/p CABG x 3 ( LIMA to LAD, RIMA to PDA, L radial to OM1). He is s/p stent in 2008, s/p EVAR 2009 for AAA, who presented to [facility] on 10/25/2013 with complaints of chest pain consistent with [place]. His Troponin peaked at 0.95. Cath was done 10/26/13 and revealed an occluded free radial graft to his OM. The LIMA-LAD, and free RIMA - RCA was patent. The plan is for medical therapy. EF was normal by echo.         The pt has other medical issues. He has had loose stools and wgt loss over the past several weeks. He has been followed by Dr Samara Deist for this. No obvious cause has been found.  He has a history of migraine headaches and is on chronic Verapamil BID. He was transferred to telemetry and ambulated. He did have an episode as asymptomatic AV block. Dr Marlou Porch reviewed the EKG and felt it was type 1,  2nd degree AVB. His verapamil was decreased to once a day and his Aldomet was discontinued. He was still having GI issues and was seen by Dr Amedeo Plenty prior to discharge. We feel the pt can be discharged late on the 19th.    Discharge Vitals:  Blood pressure 108/55, pulse 59, temperature 98.3 F (36.8 C), temperature source Oral, resp. rate 18, height _0  (1.778 m), weight 169 lb 12.1 oz (77 kg),  SpO2 98.00%.    Labs: Results for orders placed during the hospital encounter of 10/25/13 (from the past 48 hour(s))  HEPARIN LEVEL (UNFRACTIONATED)     Status: None   Collection Time    10/27/13  5:45 PM      Result Value Range   Heparin Unfractionated 0.56  0.30 - 0.70 IU/mL   Comment:            IF HEPARIN RESULTS ARE BELOW     EXPECTED VALUES, AND PATIENT     DOSAGE HAS BEEN CONFIRMED,     SUGGEST FOLLOW UP TESTING     OF ANTITHROMBIN III LEVELS.  CBC     Status: Abnormal   Collection Time    10/28/13  5:00 AM      Result Value Range   WBC 8.3  4.0 - 10.5 K/uL   RBC 3.59 (*) 4.22 - 5.81 MIL/uL   Hemoglobin 11.6 (*) 13.0 - 17.0 g/dL   HCT 33.5 (*) 39.0 - 52.0 %   MCV 93.3  78.0 - 100.0 fL   MCH 32.3  26.0 - 34.0 pg   MCHC 34.6  30.0 - 36.0 g/dL   RDW 14.0  11.5 - 15.5 %  Platelets 332  150 - 400 K/uL  HEPARIN LEVEL (UNFRACTIONATED)     Status: None   Collection Time    10/28/13  5:00 AM      Result Value Range   Heparin Unfractionated 0.51  0.30 - 0.70 IU/mL   Comment:            IF HEPARIN RESULTS ARE BELOW     EXPECTED VALUES, AND PATIENT     DOSAGE HAS BEEN CONFIRMED,     SUGGEST FOLLOW UP TESTING     OF ANTITHROMBIN III LEVELS.  BASIC METABOLIC PANEL     Status: Abnormal   Collection Time    10/28/13  5:00 AM      Result Value Range   Sodium 139  137 - 147 mEq/L   Potassium 3.1 (*) 3.7 - 5.3 mEq/L   Chloride 101  96 - 112 mEq/L   CO2 21  19 - 32 mEq/L   Glucose, Bld 94  70 - 99 mg/dL   BUN 8  6 - 23 mg/dL   Creatinine, Ser 0.84  0.50 - 1.35 mg/dL   Calcium 8.5  8.4 - 10.5 mg/dL   GFR calc non Af Amer >90  >90 mL/min   GFR calc Af Amer >90  >90 mL/min   Comment: (NOTE)     The eGFR has been calculated using the CKD EPI equation.     This calculation has not been validated in all clinical situations.     eGFR's persistently <90 mL/min signify possible Chronic Kidney     Disease.  BASIC METABOLIC PANEL     Status: Abnormal   Collection Time     10/29/13  3:03 AM      Result Value Range   Sodium 139  137 - 147 mEq/L   Potassium 3.3 (*) 3.7 - 5.3 mEq/L   Chloride 101  96 - 112 mEq/L   CO2 22  19 - 32 mEq/L   Glucose, Bld 92  70 - 99 mg/dL   BUN 8  6 - 23 mg/dL   Creatinine, Ser 0.84  0.50 - 1.35 mg/dL   Calcium 8.4  8.4 - 10.5 mg/dL   GFR calc non Af Amer >90  >90 mL/min   GFR calc Af Amer >90  >90 mL/min   Comment: (NOTE)     The eGFR has been calculated using the CKD EPI equation.     This calculation has not been validated in all clinical situations.     eGFR's persistently <90 mL/min signify possible Chronic Kidney     Disease.    Disposition:      Follow-up Information   Follow up with Jenkins Rouge, MD On 11/09/2013. (8 am)    Specialty:  Cardiology   Contact information:   [address] [place] [address] [phone]       Follow up with HAYES,JOHN C, MD. (call ofice for follow up in 2 weeks)    Specialty:  Gastroenterology   Contact information:   [address], [place] [address] [phone]       Discharge Medications:    Medication List    STOP taking these medications       aspirin 325 MG tablet  Replaced by:  aspirin 81 MG EC tablet     doxycycline 100 MG capsule  Commonly known as:  VIBRAMYCIN     methyldopa 250 MG tablet  Commonly known as:  ALDOMET  TAKE these medications       acetaminophen 325 MG tablet  Commonly known as:  TYLENOL  Take 2 tablets (650 mg total) by mouth every 4 (four) hours as needed for headache or mild pain.     aspirin 81 MG EC tablet  Take 1 tablet (81 mg total) by mouth daily.     clidinium-chlordiazePOXIDE 5-2.5 MG per capsule  Commonly known as:  LIBRAX  Take 1 capsule by mouth 3 (three) times daily before meals.     clopidogrel 75 MG tablet  Commonly known as:  PLAVIX  Take 1 tablet (75 mg total) by mouth daily with breakfast.     fish oil-omega-3 fatty acids 1000 MG capsule  Take 1,200 mg by mouth 2  (two) times daily.     GLUCOSAMINE CHONDR COMPLEX PO  - Take 500 mg by mouth 2 (two) times daily. 500 mg 2 times daily  -      hydrochlorothiazide 12.5 MG capsule  Commonly known as:  MICROZIDE  Take 1 capsule by mouth  daily     isosorbide mononitrate 30 MG 24 hr tablet  Commonly known as:  IMDUR  Take 1 tablet (30 mg total) by mouth daily.     metroNIDAZOLE 500 MG tablet  Commonly known as:  FLAGYL  Take 500 mg by mouth 3 (three) times daily.     niacin 1000 MG CR tablet  Commonly known as:  NIASPAN  Take 1,000 mg by mouth 2 (two) times daily.     nitroGLYCERIN 0.4 MG SL tablet  Commonly known as:  NITROSTAT  Place 1 tablet (0.4 mg total) under the tongue every 5 (five) minutes x 3 doses as needed for chest pain.     potassium chloride SA 20 MEQ tablet  Commonly known as:  K-DUR,KLOR-CON  Take 1 tablet (20 mEq total) by mouth daily.  Start taking on:  10/30/2013     ramipril 10 MG capsule  Commonly known as:  ALTACE  Take 1 capsule (10 mg total) by mouth 2 (two) times daily.     verapamil 120 MG tablet  Commonly known as:  CALAN  Take 1 tablet (120 mg total) by mouth daily.     ZETIA 10 MG tablet  Generic drug:  ezetimibe  Take 1 tablet by mouth  daily         Duration of Discharge Encounter: Greater than 30 minutes including physician time.  Angelena Form PA-C 10/29/2013 4:27 PM  Patient seen and examined, agree with above. Please see my note for full details. Changed her verapamil to once a day. He is very concerned about the possibility of cluster headaches. May consider neurologic evaluation in the future. Second-degree heart block type I was noted during hospitalization.  35 minutes spent on discharge, review of medical records, lab work, patient education  Candee Furbish, MD  ADDENDUM: Principal diagnosis: NSTEMI Candee Furbish, MD

## 2013-10-29 NOTE — Progress Notes (Signed)
Discharge instructions provided along with med list. IV d/c'd with catheter intact. Patient transported via wheelchair to lobby for d/c home.Cindee Salt

## 2013-10-29 NOTE — Progress Notes (Signed)
Subjective:  No angina. He is still complaining of nausea, loose stools. He doesn't think this has improved with antibiotics and doesn't want to go until this is addressed.  Objective:  Vital Signs in the last 24 hours: Temp:  [97.2 F (36.2 C)-98 F (36.7 C)] 97.9 F (36.6 C) (01/19 0537) Pulse Rate:  [52-71] 63 (01/19 0537) Resp:  [18] 18 (01/19 0537) BP: (104-117)/(51-69) 117/59 mmHg (01/19 0537) SpO2:  [96 %-98 %] 96 % (01/19 0537)  Intake/Output from previous day:  Intake/Output Summary (Last 24 hours) at 10/29/13 0816 Last data filed at 10/28/13 1300  Gross per 24 hour  Intake    240 ml  Output      0 ml  Net    240 ml    Physical Exam: General appearance: alert, cooperative and no distress Lungs: clear to auscultation bilaterally Heart: regular rate and rhythm Extremities: No edema, normal pulses Neurologic: Alert, oriented, moves all cavities x4.   Rate: 72  Rhythm: normal sinus rhythm and it looks like he had transient CHB during the night, narrow QRS with no change in rate.  Upon further review, second-degree heart block type 1. [facility]). Marlou Porch, MD)  Lab Results:  Recent Labs  10/27/13 0400 10/28/13 0500  WBC 6.6 8.3  HGB 11.4* 11.6*  PLT 361 332    Recent Labs  10/28/13 0500 10/29/13 0303  NA 139 139  K 3.1* 3.3*  CL 101 101  CO2 21 22  GLUCOSE 94 92  BUN 8 8  CREATININE 0.84 0.84    Recent Labs  10/26/13 1120  TROPONINI 0.52*   No results found for this basename: INR,  in the last 72 hours  Imaging: Imaging results have been reviewed  Cardiac Studies: Echocardiogram (1/17):  Study Conclusions  - Left ventricle: The cavity size was normal. There was mild concentric hypertrophy. Systolic function was normal. The estimated ejection fraction was in the range of 55% to 60%. Wall motion was normal; there were no regional wall motion abnormalities. - Aortic valve: Trivial regurgitation. - Left atrium: The atrium was severely  dilated. - Right atrium: The atrium was moderately dilated. - Atrial septum: No defect or patent foramen ovale was identified. - Pulmonary arteries: PA peak pressure: 13mm Hg (S).   Assessment/Plan:   Principal Problem:   Unstable angina Active Problems:   CAD- CABG '08,occluded RA-OM graft 10/26/13   NSTEMI - Troponin 0.9   Complete heart block   Paroxysmal atrial fibrillation   Protein-calorie malnutrition, severe   HYPERLIPIDEMIA- history of statin intol   HYPERTENSION   AORTIC ANEURYSM- s/p endograft '09   Carotid bruit- 06-26% LICA by doppler 9/48    PLAN: Will stop Calan and Aldomet till reviewed by MD. Consider asking GI to see, Dr Amedeo Plenty has been following him. Should be OK to stop Heparin. Replace K+.  Kerin Ransom PA-C Beeper [phone] 10/29/2013, 8:16 AM   I personally reviewed telemetry. He has second-degree heart block type I. There is no clear evidence of complete heart block. Asymptomatic. No history of syncope.  He is very concerned about discontinuing his verapamil. He has been on this for 15 years and has not had a cluster headache. His cluster headaches were severe enough where he had to stop his car and address.  He is aware that with his AV block, he may in the future require pacemaker. I will go ahead and reduce his Varapamil to once daily. I also suggested neurology/headache specialist referral as an outpatient to  further discuss other options for cluster headache prophylaxis. Both he and Dr. Johnsie Cancel had been aware of this issue for quite some time.  He and his wife have been requesting GI consultation since over the weekend, and we will go ahead and call Dr. Amedeo Plenty to discuss his chronic nausea after eating and diarrhea. In looking at side effects of methyldopa, persistent nausea, vomiting have been reported. We will go ahead and discontinue.  In regards to his mild non-ST elevation myocardial infarction, I'm fine discontinuing his heparin. Continue to replete  potassium-diarrhea likely contributing. Has history of statin intolerance. Cardiac catheterization reviewed.  I would be comfortable with discharge later today.  Candee Furbish, MD

## 2013-10-29 NOTE — Consult Note (Signed)
[facility] Consult Note  Referring Provider: No ref. provider found Primary Care Physician:  KOIRALA,DIBAS, MD Primary Gastroenterologist:  Dr.  Chief Complaint: Diarrhea and nausea HPI: [name] Brown is an 62 y.o. white male  admitted with myocardial infarction whom I was already seeing for complaints of diarrhea and nausea of one month's duration. I obtained celiac antibodies and a C. difficile toxin in the office and they were negative and decided to treat him empirically with metronidazole. This has been continued through the hospitalization but he has had no improvement in his diarrhea which is associated with postprandial cramps and nausea but no vomiting. Complete history was taken in the office and there is no recent history of antibiotics or travel. He does not get his drinking water from a well  Past Medical History  Diagnosis Date  . Bradycardia   . Hyperlipidemia   . HTN (hypertension)   . CAD (coronary artery disease) CABG 03/2000    Median sternotomy for coronary artery bypass grafting x 3 (left  . Aortic aneurysm Endograf 2009    Past Surgical History  Procedure Laterality Date  . Coronary artery bypass graft      AAA stent graft on 12/01/2007 at [facility]  coronary bypass in 2001. Dr. Owen.    Medications Prior to Admission  Medication Sig Dispense Refill  . aspirin 325 MG tablet Take 325 mg by mouth daily.        . doxycycline (VIBRAMYCIN) 100 MG capsule Take 25 mg by mouth 2 (two) times daily. 1/4 tab 2 times daily      . fish oil-omega-3 fatty acids 1000 MG capsule Take 1,200 mg by mouth 2 (two) times daily.       . Glucosamine-Chondroitin (GLUCOSAMINE CHONDR COMPLEX PO) Take 500 mg by mouth 2 (two) times daily. 500 mg 2 times daily       . hydrochlorothiazide (MICROZIDE) 12.5 MG capsule Take 1 capsule by mouth  daily  90 capsule  1  . methyldopa (ALDOMET) 250 MG tablet Take 1 tablet (250 mg total) by mouth 2 (two) times daily.   60 tablet  11  . metroNIDAZOLE (FLAGYL) 500 MG tablet Take 500 mg by mouth 3 (three) times daily.      . niacin (NIASPAN) 1000 MG CR tablet Take 1,000 mg by mouth 2 (two) times daily.        . ramipril (ALTACE) 10 MG capsule Take 1 capsule (10 mg total) by mouth 2 (two) times daily.  60 capsule  11  . ZETIA 10 MG tablet Take 1 tablet by mouth  daily  90 tablet  3    Allergies:  Allergies  Allergen Reactions  . Statins     Myalgias with elevated CPKs    Family History  Problem Relation Age of Onset  . Heart attack Mother   . Heart disease Mother   . Heart attack Father   . Heart disease Father     Social History:  reports that he quit smoking about 21 years ago. His smoking use included Cigarettes. He has a 60 pack-year smoking history. He has never used smokeless tobacco. He reports that he does not drink alcohol or use illicit drugs.  Review of Systems: negative except as above   Blood pressure 122/73, pulse 76, temperature 97.9 F (36.6 C), temperature source Oral, resp. rate 18, height 5' 10" (1.778 m), weight 77 kg (169 lb 12.1 oz), SpO2 96.00%. Head: Normocephalic, without obvious abnormality, atraumatic   Neck: no adenopathy, no carotid bruit, no JVD, supple, symmetrical, trachea midline and thyroid not enlarged, symmetric, no tenderness/mass/nodules Resp: clear to auscultation bilaterally Cardio: regular rate and rhythm, S1, S2 normal, no murmur, click, rub or gallop GI: Abdomen soft nondistended with normoactive bowel sounds no hepatosplenomegaly mass or guarding Extremities: extremities normal, atraumatic, no cyanosis or edema  Results for orders placed during the hospital encounter of 10/25/13 (from the past 48 hour(s))  HEPARIN LEVEL (UNFRACTIONATED)     Status: None   Collection Time    10/27/13  5:45 PM      Result Value Range   Heparin Unfractionated 0.56  0.30 - 0.70 IU/mL   Comment:            IF HEPARIN RESULTS ARE BELOW     EXPECTED VALUES, AND PATIENT      DOSAGE HAS BEEN CONFIRMED,     SUGGEST FOLLOW UP TESTING     OF ANTITHROMBIN III LEVELS.  CBC     Status: Abnormal   Collection Time    10/28/13  5:00 AM      Result Value Range   WBC 8.3  4.0 - 10.5 K/uL   RBC 3.59 (*) 4.22 - 5.81 MIL/uL   Hemoglobin 11.6 (*) 13.0 - 17.0 g/dL   HCT 33.5 (*) 39.0 - 52.0 %   MCV 93.3  78.0 - 100.0 fL   MCH 32.3  26.0 - 34.0 pg   MCHC 34.6  30.0 - 36.0 g/dL   RDW 14.0  11.5 - 15.5 %   Platelets 332  150 - 400 K/uL  HEPARIN LEVEL (UNFRACTIONATED)     Status: None   Collection Time    10/28/13  5:00 AM      Result Value Range   Heparin Unfractionated 0.51  0.30 - 0.70 IU/mL   Comment:            IF HEPARIN RESULTS ARE BELOW     EXPECTED VALUES, AND PATIENT     DOSAGE HAS BEEN CONFIRMED,     SUGGEST FOLLOW UP TESTING     OF ANTITHROMBIN III LEVELS.  BASIC METABOLIC PANEL     Status: Abnormal   Collection Time    10/28/13  5:00 AM      Result Value Range   Sodium 139  137 - 147 mEq/L   Potassium 3.1 (*) 3.7 - 5.3 mEq/L   Chloride 101  96 - 112 mEq/L   CO2 21  19 - 32 mEq/L   Glucose, Bld 94  70 - 99 mg/dL   BUN 8  6 - 23 mg/dL   Creatinine, Ser 0.84  0.50 - 1.35 mg/dL   Calcium 8.5  8.4 - 10.5 mg/dL   GFR calc non Af Amer >90  >90 mL/min   GFR calc Af Amer >90  >90 mL/min   Comment: (NOTE)     The eGFR has been calculated using the CKD EPI equation.     This calculation has not been validated in all clinical situations.     eGFR's persistently <90 mL/min signify possible Chronic Kidney     Disease.  BASIC METABOLIC PANEL     Status: Abnormal   Collection Time    10/29/13  3:03 AM      Result Value Range   Sodium 139  137 - 147 mEq/L   Potassium 3.3 (*) 3.7 - 5.3 mEq/L   Chloride 101  96 - 112 mEq/L   CO2 22  19 -   32 mEq/L   Glucose, Bld 92  70 - 99 mg/dL   BUN 8  6 - 23 mg/dL   Creatinine, Ser 0.84  0.50 - 1.35 mg/dL   Calcium 8.4  8.4 - 10.5 mg/dL   GFR calc non Af Amer >90  >90 mL/min   GFR calc Af Amer >90  >90 mL/min    Comment: (NOTE)     The eGFR has been calculated using the CKD EPI equation.     This calculation has not been validated in all clinical situations.     eGFR's persistently <90 mL/min signify possible Chronic Kidney     Disease.   No results found.  Assessment: Nonspecific complaints of diarrhea nausea without vomiting and some lower abdominal cramps most suggestive overall at this point of irritable bowel syndrome although with some atypical features. Plan:  Patient otherwise ready for discharge and would continue a empiric approach at this point to see him in the office. I gave him a prescription for Librax one pill 3 times a day to take until his scheduled followup point with me in 2 weeks. Okay for discharge from GI standpoint. HAYES,JOHN C 10/29/2013, 1:39 PM    

## 2013-10-29 NOTE — Progress Notes (Signed)
MD on call notified  Pt had a Rhythm change to 2 AV block type two pt was asymptomatic at this time bp 111/52  EKG is in chart. Will continue to monitor. Arthor Captain LPN

## 2013-10-29 NOTE — Progress Notes (Signed)
CARDIAC REHAB PHASE I   PRE:  Rate/Rhythm: 68SR  BP:  Supine: 90/50  Sitting:   Standing:    SaO2:   MODE:  Ambulation: 550 ft   POST:  Rate/Rhythm: 73SR  BP:  Supine:   Sitting: 110/50  Standing:    SaO2:  1440-1456 Pt has been walking independently without CP. Wants to go home. Ambulated 550 ft with steady gait. Denied CP. To bed after walk.   Graylon Good, RN BSN  10/29/2013 2:52 PM

## 2013-10-29 NOTE — Discharge Instructions (Signed)
Myocardial Infarction  A myocardial infarction (MI) is damage to the heart that is not reversible. It is also called a heart attack. An MI usually occurs when a heart (coronary) artery becomes blocked or narrowed. This cuts off the blood supply to the heart. When one or more of the heart (coronary) arteries becomes blocked, that area of the heart begins to die. This causes pain felt during an MI.   If you think you might be having an MI, call your local emergency services immediately (911 in [place] It is recommended that you take a 162 mg non-enteric coated aspirin if you do not have an aspirin allergy. Do not drive yourself to the hospital or wait to see if your symptoms go away. The sooner MI is treated, the greater the amount of heart muscle saved. Time is muscle. It can save your life.  CAUSES   An MI can occur from:  · A gradual buildup of a fatty substance called plaque. When plaque builds up in the arteries, this condition is called atherosclerosis. This buildup can block or reduce the blood supply to the heart artery(s).  · A sudden plaque rupture within a heart artery that causes a blood clot (thrombus). A blood clot can block the heart artery which does not allow blood flow to the heart.  · A severe tightening (spasm) of the heart artery. This is a less common cause of a heart attack. When a heart artery spasms, it cuts off blood flow through the artery. Spasms can occur in heart arteries that do not have atherosclerosis.  RISK FACTORS  People at risk for an MI usually have one or more risk factors, such as:  · High blood pressure.  · High cholesterol.  · Smoking.  · Gender. Men have a higher heart attack risk.  · Overweight/obesity.  · Age.  · Family history.  · Lack of exercise.  · Diabetes.  · Stress.  · Excessive alcohol use.  · Street drug use (cocaine and methamphetamines).  SYMPTOMS   MI symptoms can vary, such as:  · In both men and women, MI symptoms can include the following:  · Chest pain. The  chest pain may feel like a crushing, squeezing, or "pressure" type feeling. MI pain can be "referred," meaning pain can be caused in one part of the body but felt in another part of the body. Referred MI pain may occur in the left arm, neck, or jaw. Pain may even be felt in the right arm.  · Shortness of breath (dyspnea).  · Heartburn or indigestion with or without vomiting, shortness of breath, or sweating (diaphoresis).  · Sudden, cold sweats.  · Sudden lightheadedness.  · Upper back pain.  · Women can have unique MI symptoms, such as:  · Unexplained feelings of nervousness or anxiety.  · Discomfort between the shoulder blades (scapula) or upper back.  · Tingling in the hands and arms.  · In elderly people (regardless of gender), MI symptoms can be subtle, such as:  · Sweating (diaphoresis).  · Shortness of breath (dyspnea).  · General tiredness (fatigue) or not feeling well (malaise).  DIAGNOSIS   Diagnosis of an MI involves several tests such as:  · An assessment of your vital signs such as heart rhythm, blood pressure, respiratory rate, and oxygen level.  · An EKG (ECG) to look at the electrical activity of your heart.  · Blood tests called cardiac markers are drawn at scheduled times to measure proteins or   enzymes released by the damaged heart muscle.  · A chest X-ray.  · An echocardiogram to evaluate heart motion and blood flow.  · Coronary angiography (cardiac catheterization). This is a diagnostic procedure to look at the heart arteries.  TREATMENT   Acute Intervention. For an MI, the national standard in the [place] is to have an acute intervention in under 90 minutes from the time you get to the hospital. An acute intervention is a special procedure to open up the heart arteries. It is done in a treatment room called a "catheterization lab" (cath lab). Some hospitals do no have a cath lab. If you are having an MI and the hospital does not have a cath lab, the standard is to transport you to a  hospital that has one. In the cath lab, acute intervention includes:  · Angioplasty. An angioplasty involves inserting a thin, flexible tube (catheter) into an artery in either your groin or wrist. The catheter is threaded to the heart arteries. A balloon at the end of the catheter is inflated to open a narrowed or blocked heart artery. During an angioplasty procedure, a small mesh tube (stent) may be used to keep the heart artery open. Depending on your condition and health history, one of two types of stents may be placed:  · Drug-eluting stent (DES). A DES is coated with a medicine to prevent scar tissue from growing over the stent. With drug-eluting stents, blood thinning medicine will need to be taken for up to a year.  · Bare metal stent. This type of stent has no special coating to keep tissue from growing over it. This type of stent is used if you cannot take blood thinning medicine for a prolonged time or you need surgery in the near future. After a bare metal stent is placed, blood thinning medicine will need to be taken for about a month.  · If you are taking blood thinning medicine (anti-platelet therapy) after stent placement, do not stop taking it unless your caregiver says it is okay to do so. Make sure you understand how long you need to take the medicine.  Surgical Intervention  · If an acute intervention is not successful, surgery may be needed:  · Open heart surgery (coronary artery bypass graft, CABG). CABG takes a vein (saphenous vein) from your leg. The vein is then attached to the blocked heart artery which bypasses the blockage. This then allows blood flow to the heart muscle.  Additional Interventions  · A "clot buster" medicine (thrombolytic) may be given. This medicine can help break up a clot in the heart artery. This medicine may be given if a person cannot get to a cath lab right away.  · Intra-aortic balloon pump (IABP). If you have suffered a very severe MI and are too unstable to go  to the cath lab or to surgery, an IABP may be used. This is a temporary mechanical device used to increase blood flow to the heart and reduce the workload of the heart until you are stable enough to go to the cath lab or surgery.  HOME CARE INSTRUCTIONS  After an MI, you may need the following:  · Medicine. Take medicine as directed by your caregiver. Medicines after an MI may:  · Keep your blood from clotting easily (blood thinners).  · Control your blood pressure.  · Help lower your cholesterol.  · Control abnormal heart rhythms.  · Lifestyle changes. Under the guidance of your caregiver, lifestyle changes   include:  · Quitting smoking, if you smoke. Your caregiver can help you quit.  · Being physically active.  · Maintaining a healthy weight.  · Eating a heart healthy diet. A dietitian can help you learn healthy eating options.  · Managing diabetes.  · Reducing stress.  · Limiting alcohol intake.  SEEK IMMEDIATE MEDICAL CARE IF:   · You have severe chest pain, especially if the pain is crushing or pressure-like and spreads to the arms, back, neck, or jaw. This is an emergency. Do not wait to see if the pain will go away. Get medical help at once. Call your local emergency services (911 in the [place] Do not drive yourself to the hospital.  · You have shortness of breath during rest, sleep, or with activity.  · You have sudden sweating or clammy skin.  · You feel sick to your stomach (nauseous) and throw up (vomit).  · You suddenly become lightheaded or dizzy.  · You feel your heart beating rapidly or you notice "skipped" beats.  MAKE SURE YOU:   · Understand these instructions.  · Will watch your condition.  · Will get help right away if you are not doing well or get worse.  Document Released: 09/27/2005 Document Revised: 06/21/2012 Document Reviewed: 03/02/2011  [employer]® Patient Information ©2014 [employer].

## 2013-10-30 ENCOUNTER — Telehealth: Payer: Self-pay | Admitting: Cardiovascular Disease

## 2013-10-30 NOTE — Telephone Encounter (Signed)
New message     Released from hosp yesterday---took nitro at 1am this morning because of chest pain--he was instructed to call Dr Johnsie Cancel if he had to take any nitro.

## 2013-10-30 NOTE — Telephone Encounter (Signed)
Pt was d/C from the hospital yesterday post cardiac cath; he is newly diagnosed with A-fib.  Pt. Called this AM because about 1 AM this morning  had chest pain score of "2" at the time when he was having the CP pt's BP was 189/144. Pt took one NTG SL with relief. 2 minutes later pt's  BP was 142/81. Pt has an appointment with Dr. Johnsie Cancel on 11/09/13 at 8:00 AM pt is aware.

## 2013-10-31 ENCOUNTER — Telehealth: Payer: Self-pay | Admitting: Cardiovascular Disease

## 2013-10-31 DIAGNOSIS — G43909 Migraine, unspecified, not intractable, without status migrainosus: Secondary | ICD-10-CM

## 2013-10-31 NOTE — Telephone Encounter (Signed)
PT  AWARE  REFERRAL ORDER  ENTERED .Adonis Housekeeper

## 2013-10-31 NOTE — Telephone Encounter (Signed)
New message  Patient would like a referral to [facility]. Due to medications causing migraines. Please call and advise.

## 2013-10-31 NOTE — Telephone Encounter (Signed)
Refer him to Dr TAT with our neuro department instead

## 2013-10-31 NOTE — Telephone Encounter (Signed)
WILL FORWARD TO DR NISHAN FOR  REVIEW./CY 

## 2013-10-31 NOTE — Telephone Encounter (Signed)
Pt called again today because he continues having chest pain since his  cardiac cath  Pt was  D/C from the hospital 10/29/13. Pt states had chest pain x 1 last night at 9:21 PM' and x 2 this AM. BP last night was 179/92 At 2:29 AM pt's BP was  173/107,   Pain score 2" every time  pt took a NTG SL  pt Had relief 5 to 8 minutes later , his BP went down after pain was relieved  to 120/69 and 142/80. Pt did not take NTG at 6:14 AM pain score was "0.5" then . Pain was relieved without NTG BP was 182/99 6 minutes later pt had relief BP was 143/83. Pt has a F/U appointment 11/09/13 at 8:00  AM. Pt. Would like to know what to do.

## 2013-10-31 NOTE — Telephone Encounter (Signed)
Patient is still having chest pain, please call and advise.

## 2013-10-31 NOTE — Telephone Encounter (Signed)
Dr Johnsie Cancel is aware of pt's signs and symptoms. MD states because pt just had a Cardiac cath,  he does not need to be seen in this office today.MD recommends for pt to continue taking NTG as needed. Pt is to go to the ER if he is taking too many NTG SL and feels bad, pt is to keep his appointment on 11/09/13. Pt verbalized understanding.

## 2013-11-09 ENCOUNTER — Ambulatory Visit (INDEPENDENT_AMBULATORY_CARE_PROVIDER_SITE_OTHER): Payer: 59 | Admitting: Cardiovascular Disease

## 2013-11-09 ENCOUNTER — Encounter: Payer: Self-pay | Admitting: Cardiovascular Disease

## 2013-11-09 ENCOUNTER — Encounter (INDEPENDENT_AMBULATORY_CARE_PROVIDER_SITE_OTHER): Payer: Self-pay

## 2013-11-09 VITALS — BP 124/69 | HR 56 | Ht 70.0 in | Wt 173.0 lb

## 2013-11-09 DIAGNOSIS — E785 Hyperlipidemia, unspecified: Secondary | ICD-10-CM

## 2013-11-09 DIAGNOSIS — R0989 Other specified symptoms and signs involving the circulatory and respiratory systems: Secondary | ICD-10-CM

## 2013-11-09 DIAGNOSIS — I498 Other specified cardiac arrhythmias: Secondary | ICD-10-CM

## 2013-11-09 DIAGNOSIS — I10 Essential (primary) hypertension: Secondary | ICD-10-CM

## 2013-11-09 DIAGNOSIS — I719 Aortic aneurysm of unspecified site, without rupture: Secondary | ICD-10-CM

## 2013-11-09 DIAGNOSIS — I2510 Atherosclerotic heart disease of native coronary artery without angina pectoris: Secondary | ICD-10-CM

## 2013-11-09 MED ORDER — RANOLAZINE ER 500 MG PO TB12: 500.0000 mg | ORAL_TABLET | Freq: Two times a day (BID) | ORAL | Status: DC

## 2013-11-09 NOTE — Assessment & Plan Note (Signed)
F/U duplex next visit known 20-60% LICA

## 2013-11-09 NOTE — Patient Instructions (Addendum)
Your physician recommends that you schedule a follow-up appointment in: NEXT  AVAILABLE WITH DR [facility]  Your physician has recommended you make the following change in your medication: TAKE IMDUR  30 MG   AT [place] 500 MG  AT  NIGHT

## 2013-11-09 NOTE — Assessment & Plan Note (Signed)
F/U Dr Scot Dock stable endoleak imaging intervals per [facility]

## 2013-11-09 NOTE — Assessment & Plan Note (Signed)
Resolved.  No presyncope Verapamil dose decreased.  F/U ECG next visit for QT and intervals

## 2013-11-09 NOTE — Assessment & Plan Note (Signed)
Cholesterol is at goal.  Continue current dose of statin and diet Rx.  No myalgias or side effects.  F/U  LFT's in 6 months. Lab Results  Component Value Date   LDLCALC 131* 09/06/2012  Intolerant to statins continue zetia

## 2013-11-09 NOTE — Progress Notes (Signed)
Patient ID: [name] Brown, male   DOB: Jan 27, 1951, 63 y.o.   MRN: [id number] The pt is a 63 y.o. male w/ PMHx significant for CAD s/p CABG x 3 ( LIMA to LAD, RIMA to PDA, L radial to OM1). He is s/p stent in 2008, s/p EVAR 2009 for AAA, Presented to [facility] 2 weeks ago with chest pain.  Cath by Dr [place] showed stable disease for medical Rx   Cath 10/25/13 [place]   Films reviewed  Coronary angiography:  Coronary dominance: right  Left mainstem: Heavily calcified and tortuous. 40% distal stenosis.  Left anterior descending (LAD): Very complex proximal disease up to 95%. Heavy calcification. The LAD is occluded after the first diagonal and first septal perforator. The first diagonal is a moderate sized vessel with 40% disease.  Left circumflex (LCx): The left circumflex is calcified. There is 30-40% disease in the mid vessel. The first OM appears small and is occluded. The second OM is tiny. The terminal OM has segmental 50-60% disease.  Right coronary artery (RCA): 100% proximal occlusion.  The free radial graft to the OM is 100% occluded at the ostium.  The RIMA graft to the RCA is a large graft and widely patent.  The LIMA graft to the LAD is a large graft and is widely patent. It gives collaterals to an RV marginal branch.  Left ventriculography: Left ventricular systolic function is normal, LVEF is estimated at 50-55%, There is hypokinesis of the mid anterior wall, there is no significant mitral regurgitation  Final Conclusions:  1. Severe 3 vessel occlusive CAD  2. Patent LIMA to the LAD  3. Patent RIMA to the RCA  4. Occluded free radial graft to the OM  5. Good LV function.  Recommendations: Medical management.   Having SSCP at night Not with exercise  BP goes up with pain Some relief with nitro . No pain during day when active Doxycyline not started post d/c  Has been on these chronically for periodontal disease.  In hospital had some type one AV block and verapamil decreased .  NO GERD  symptoms  Loose stools and migraines some better Seeing Dr Amedeo Plenty GI next week       ROS: Denies fever, malais, weight loss, blurry vision, decreased visual acuity, cough, sputum, SOB, hemoptysis, pleuritic pain, palpitaitons, heartburn, abdominal pain, melena, lower extremity edema, claudication, or rash.  All other systems reviewed and negative  General: Affect appropriate Chronically ill male with poor dentition HEENT: normal Neck supple with no adenopathy JVP normal left  bruits no thyromegaly Lungs clear with no wheezing and good diaphragmatic motion Heart:  S1/S2 no murmur, no rub, gallop or click PMI normal Abdomen: benighn, BS positve, no tenderness,  No AAA palpable  no bruit.  No HSM or HJR Distal pulses intact with no bruits No edema Neuro non-focal Skin warm and dry No muscular weakness   Current Outpatient Prescriptions  Medication Sig Dispense Refill  . acetaminophen (TYLENOL) 325 MG tablet Take 2 tablets (650 mg total) by mouth every 4 (four) hours as needed for headache or mild pain.      Marland Kitchen aspirin EC 81 MG EC tablet Take 1 tablet (81 mg total) by mouth daily.      . clidinium-chlordiazePOXIDE (LIBRAX) 5-2.5 MG per capsule Take 1 capsule by mouth 3 (three) times daily before meals.  60 capsule  3  . clopidogrel (PLAVIX) 75 MG tablet Take 1 tablet (75 mg total) by mouth daily with breakfast.  90  tablet  3  . fish oil-omega-3 fatty acids 1000 MG capsule Take 1,200 mg by mouth 2 (two) times daily.       . Glucosamine-Chondroitin (GLUCOSAMINE CHONDR COMPLEX PO) Take 500 mg by mouth 2 (two) times daily. 500 mg 2 times daily       . hydrochlorothiazide (MICROZIDE) 12.5 MG capsule Take 1 capsule by mouth  daily  90 capsule  1  . isosorbide mononitrate (IMDUR) 30 MG 24 hr tablet Take 1 tablet (30 mg total) by mouth daily.  90 tablet  3  . niacin (NIASPAN) 1000 MG CR tablet Take 1,000 mg by mouth 2 (two) times daily.        . nitroGLYCERIN (NITROSTAT) 0.4 MG SL tablet  Place 1 tablet (0.4 mg total) under the tongue every 5 (five) minutes x 3 doses as needed for chest pain.  25 tablet  2  . potassium chloride SA (K-DUR,KLOR-CON) 20 MEQ tablet Take 1 tablet (20 mEq total) by mouth daily.  90 tablet  2  . ramipril (ALTACE) 10 MG capsule Take 1 capsule (10 mg total) by mouth 2 (two) times daily.  60 capsule  11  . verapamil (CALAN) 120 MG tablet Take 1 tablet (120 mg total) by mouth daily.      Marland Kitchen ZETIA 10 MG tablet Take 1 tablet by mouth  daily  90 tablet  3  . ranolazine (RANEXA) 500 MG 12 hr tablet Take 1 tablet (500 mg total) by mouth 2 (two) times daily.  60 tablet  6  . [DISCONTINUED] metoprolol (LOPRESSOR) 50 MG tablet Take 1 tablet (50 mg total) by mouth daily. 1/2 tab po once daily       No current facility-administered medications for this visit.    Allergies  Statins  Electrocardiogram:  SR rate 62 LAFB  QT 492    Assessment and Plan

## 2013-11-09 NOTE — Assessment & Plan Note (Signed)
Pain at night should not be angina as it is non exertional Cath films reviewed and no obvious ischemic territory.  Add low dose ranexa and take imdur at night before bed.  F/U next available

## 2013-11-09 NOTE — Assessment & Plan Note (Signed)
Well controlled.  Continue current medications and low sodium Dash type diet.    

## 2013-11-15 ENCOUNTER — Telehealth: Payer: Self-pay | Admitting: Cardiovascular Disease

## 2013-11-15 NOTE — Telephone Encounter (Signed)
Informed Tammy of Dr. Kyla Balzarine response.  She is faxing a release.

## 2013-11-15 NOTE — Telephone Encounter (Signed)
Yes he can have his cleaning

## 2013-11-15 NOTE — Telephone Encounter (Signed)
New message     Pt has advanced periodontal disease and needs deep cleaning. He takes a low dosage antibiotic prior to his visits.  The dentist want to know if he can get his cleaning before the usual 18mo wait for cardiology patients.  He had a heart attack less than 48mo ago.

## 2013-11-15 NOTE — Telephone Encounter (Signed)
Spoke with Tammy at Dr. Mariel Sleet office.  Pt usually gets a "deep cleaning" every 3 months due to severe periodontal disease.  Is it ok with Dr. Johnsie Cancel to not wait the usual 6 months.  She had him on schedule for this am but wants clearance from Dr. Johnsie Cancel first.  Adv her md and his nurse are out of the office today, but I will send him a message today.  The dental office is closed on Fridays, but you are able to leave a message.

## 2013-11-27 ENCOUNTER — Ambulatory Visit: Payer: 59 | Admitting: Cardiovascular Disease

## 2013-11-28 ENCOUNTER — Ambulatory Visit: Payer: 59 | Admitting: Neurology

## 2013-11-29 ENCOUNTER — Ambulatory Visit: Payer: 59 | Admitting: Neurology

## 2013-12-04 ENCOUNTER — Ambulatory Visit (INDEPENDENT_AMBULATORY_CARE_PROVIDER_SITE_OTHER): Payer: 59 | Admitting: Neurology

## 2013-12-04 ENCOUNTER — Encounter: Payer: Self-pay | Admitting: Neurology

## 2013-12-04 VITALS — BP 140/70 | HR 52 | Wt 180.6 lb

## 2013-12-04 DIAGNOSIS — G44009 Cluster headache syndrome, unspecified, not intractable: Secondary | ICD-10-CM

## 2013-12-04 MED ORDER — INDOMETHACIN 25 MG PO CAPS: 25.0000 mg | ORAL_CAPSULE | Freq: Three times a day (TID) | ORAL | Status: DC | PRN

## 2013-12-04 MED ORDER — DIVALPROEX SODIUM ER 500 MG PO TB24: 500.0000 mg | ORAL_TABLET | Freq: Every day | ORAL | Status: DC

## 2013-12-04 NOTE — Progress Notes (Signed)
NEUROLOGY CONSULTATION NOTE  CHAMPION CORALES MRN: [id number] DOB: 1951/05/14  Referring provider: Dr. Johnsie Cancel Primary care provider: Dr. Dorthy Cooler  Reason for consult:  Cluster headaches.  HISTORY OF PRESENT ILLNESS: [name] Brown is a 63 year old right-handed man with history of hypertension, hyperlipidemia, bradycardia, CAD s/p CABG, MI and abdominal aortic aneurysm who presents for migraine..  Records and images were personally reviewed where available.    He was diagnosed with Cluster headaches about 20 years ago.  They start as aching under his eyes (usually left eye) and then into his teeth and gums, and over 10-15 minutes developed a severe debilitating pain like his "tooth being pulled through my eye socket".  No associated symptoms.  It lasts about 30 minutes.  He can't sit still.  It always occurred around 9:30 at night.  It would occur for 2 to 3 weeks every 2-3 months.  He has been well-controlled on verapamil 120mg  twice daily and has not had an attack in 20 years.  He had a recent heart attack and has a significant cardiac history.  He has baseline bradycardia (usually in the 40s) and it was recommended by his cardiologist to get off of the verapamil if possible.  He tried taking just one tablet and began having a similar headache again, although to a lesser degree.  He went back to twice a day.  For acute pain, he takes acetaminophen.  It is not too effective.  In the past, oxycodone was effective.  He never tried O2.  PAST MEDICAL HISTORY: Past Medical History  Diagnosis Date  . Bradycardia   . Hyperlipidemia   . HTN (hypertension)   . CAD (coronary artery disease) CABG 03/2000    Median sternotomy for coronary artery bypass grafting x 3 (left  . Aortic aneurysm Endograf 2009    PAST SURGICAL HISTORY: Past Surgical History  Procedure Laterality Date  . Coronary artery bypass graft      AAA stent graft on 12/01/2007 at [facility]  coronary bypass in  2001. Dr. Roxy Manns.    MEDICATIONS: Current Outpatient Prescriptions on File Prior to Visit  Medication Sig Dispense Refill  . acetaminophen (TYLENOL) 325 MG tablet Take 2 tablets (650 mg total) by mouth every 4 (four) hours as needed for headache or mild pain.      Marland Kitchen aspirin EC 81 MG EC tablet Take 1 tablet (81 mg total) by mouth daily.      . clidinium-chlordiazePOXIDE (LIBRAX) 5-2.5 MG per capsule Take 1 capsule by mouth 3 (three) times daily before meals.  60 capsule  3  . clopidogrel (PLAVIX) 75 MG tablet Take 1 tablet (75 mg total) by mouth daily with breakfast.  90 tablet  3  . fish oil-omega-3 fatty acids 1000 MG capsule Take 1,200 mg by mouth 2 (two) times daily.       . Glucosamine-Chondroitin (GLUCOSAMINE CHONDR COMPLEX PO) Take 500 mg by mouth 2 (two) times daily. 500 mg 2 times daily       . hydrochlorothiazide (MICROZIDE) 12.5 MG capsule Take 1 capsule by mouth  daily  90 capsule  1  . isosorbide mononitrate (IMDUR) 30 MG 24 hr tablet Take 1 tablet (30 mg total) by mouth daily.  90 tablet  3  . niacin (NIASPAN) 1000 MG CR tablet Take 1,000 mg by mouth 2 (two) times daily.        . nitroGLYCERIN (NITROSTAT) 0.4 MG SL tablet Place 1 tablet (0.4 mg total) under  the tongue every 5 (five) minutes x 3 doses as needed for chest pain.  25 tablet  2  . potassium chloride SA (K-DUR,KLOR-CON) 20 MEQ tablet Take 1 tablet (20 mEq total) by mouth daily.  90 tablet  2  . ramipril (ALTACE) 10 MG capsule Take 1 capsule (10 mg total) by mouth 2 (two) times daily.  60 capsule  11  . ranolazine (RANEXA) 500 MG 12 hr tablet Take 1 tablet (500 mg total) by mouth 2 (two) times daily.  60 tablet  6  . verapamil (CALAN) 120 MG tablet Take 1 tablet (120 mg total) by mouth daily.      Marland Kitchen ZETIA 10 MG tablet Take 1 tablet by mouth  daily  90 tablet  3  . [DISCONTINUED] metoprolol (LOPRESSOR) 50 MG tablet Take 1 tablet (50 mg total) by mouth daily. 1/2 tab po once daily       No current facility-administered  medications on file prior to visit.    ALLERGIES: Allergies  Allergen Reactions  . Statins     Myalgias with elevated CPKs    FAMILY HISTORY: Family History  Problem Relation Age of Onset  . Heart attack Mother   . Heart disease Mother   . Heart attack Father   . Heart disease Father     SOCIAL HISTORY: History   Social History  . Marital Status: Married    Spouse Name: N/A    Number of Children: N/A  . Years of Education: N/A   Occupational History  . Not on file.   Social History Main Topics  . Smoking status: Former Smoker -- 2.00 packs/day for 30 years    Types: Cigarettes    Quit date: 01/05/1992  . Smokeless tobacco: Never Used     Comment: quit 19 yrs ago  . Alcohol Use: No  . Drug Use: No  . Sexual Activity: Not on file   Other Topics Concern  . 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    REVIEW OF SYSTEMS: Constitutional: No fevers, chills, or sweats, no generalized fatigue, change in appetite Eyes: No visual changes, double vision, eye pain Ear, nose and throat: No hearing loss, ear pain, nasal congestion, sore throat Cardiovascular: No chest pain, palpitations Respiratory:  No shortness of breath at rest or with exertion, wheezes GastrointestinaI: No nausea, vomiting, diarrhea, abdominal pain, fecal incontinence Genitourinary:  No dysuria, urinary retention or frequency Musculoskeletal:  No neck pain, back pain Integumentary: No rash, pruritus, skin lesions Neurological: as above Psychiatric: No depression, insomnia, anxiety Endocrine: No palpitations, fatigue, diaphoresis, mood swings, change in appetite, change in weight, increased thirst Hematologic/Lymphatic:  No anemia, purpura, petechiae. Allergic/Immunologic: no itchy/runny eyes, nasal congestion, recent allergic reactions, rashes  PHYSICAL EXAM: Filed Vitals:   12/04/13 0831  BP: 140/70  Pulse: 52    General: No acute distress Head:  Normocephalic/atraumatic Neck: supple, no paraspinal tenderness, full range of motion Back: No paraspinal tenderness Heart: regular rate and rhythm Lungs: Clear to auscultation bilaterally. Vascular: No carotid bruits. Neurological Exam: Mental status: alert and oriented to person, place, and time, speech fluent and not dysarthric, language intact. Cranial nerves: CN I: not tested CN II: pupils equal, round and reactive to light, visual fields intact, fundi unremarkable. CN III, IV, VI:  full range of motion, no nystagmus, no ptosis CN V: facial sensation intact CN VII: upper and lower face symmetric  CN VIII: hearing intact CN IX, X: gag intact, uvula midline CN XI: sternocleidomastoid and trapezius muscles intact CN XII: tongue midline Bulk & Tone: normal, no fasciculations. Motor: 5/5 throughout Sensation: temperature and vibration intact. Deep Tendon Reflexes: 2+ throughout, toes down Finger to nose testing: no dysmetria Heel to shin: no dysmetria Gait: normal stance and stride.  Able to turn and walk in tandem. Romberg negative.  IMPRESSION: Cluster headaches.  Well-controlled on verapamil for 20 years.  Question regarding possibility of switching to another preventative if possible due to his bradycardia.  The only two medications that have overwhelming evidence of efficacy are verapamil and lithium.  I, myself, do not prescribe lithium as it requires regular monitoring.    PLAN: 1.  We will try Depakote ER 500mg  at bedtime.  Side effects discussed.  He will stop the verapamil for now. 2.  For acute attacks, he will try indomethacin 25mg .  Other option is to prescribe home O2. 3.  Follow up in 3 months but call in 4 weeks with update (sooner if problems).  Thank you for allowing me to take part in the care of this patient.  Metta Clines, DO  CC:  Dibas Dorthy Cooler, MD  Jenkins Rouge, MD

## 2013-12-04 NOTE — Patient Instructions (Signed)
1.   Start Depakote ER 500mg  at bedtime as preventative.  Side effects may include liver problems, tremor, hair loss, weight gain, low platelets or pancreatitis.  We will have to watch blood levels from time to time. 2.  For acute attack, take indomethacin (but only as needed). 3.  Stop the verapamil for now. 4.  Call in 4 weeks with update.  Really, other than verapamil, the only other medication really effective for cluster is lithium, which I don't prescribe and requires blood monitoring.

## 2013-12-12 ENCOUNTER — Ambulatory Visit (INDEPENDENT_AMBULATORY_CARE_PROVIDER_SITE_OTHER): Payer: 59 | Admitting: Cardiovascular Disease

## 2013-12-12 ENCOUNTER — Encounter: Payer: Self-pay | Admitting: Cardiovascular Disease

## 2013-12-12 VITALS — BP 140/78 | HR 64 | Ht 70.0 in | Wt 180.8 lb

## 2013-12-12 DIAGNOSIS — I10 Essential (primary) hypertension: Secondary | ICD-10-CM

## 2013-12-12 DIAGNOSIS — G43909 Migraine, unspecified, not intractable, without status migrainosus: Secondary | ICD-10-CM

## 2013-12-12 DIAGNOSIS — E785 Hyperlipidemia, unspecified: Secondary | ICD-10-CM

## 2013-12-12 DIAGNOSIS — I2510 Atherosclerotic heart disease of native coronary artery without angina pectoris: Secondary | ICD-10-CM

## 2013-12-12 DIAGNOSIS — I480 Paroxysmal atrial fibrillation: Secondary | ICD-10-CM

## 2013-12-12 DIAGNOSIS — I719 Aortic aneurysm of unspecified site, without rupture: Secondary | ICD-10-CM

## 2013-12-12 DIAGNOSIS — I498 Other specified cardiac arrhythmias: Secondary | ICD-10-CM

## 2013-12-12 DIAGNOSIS — I4891 Unspecified atrial fibrillation: Secondary | ICD-10-CM

## 2013-12-12 NOTE — Progress Notes (Signed)
Patient ID: [name] Brown, male   DOB: 10-14-1950, 63 y.o.   MRN: [id number] The pt is a 63 y.o. male w/ PMHx significant for CAD s/p CABG x 3 ( LIMA to LAD, RIMA to PDA, L radial to OM1). He is s/p stent in 2008, s/p EVAR 2009 for AAA,  Presented to [facility] 2 weeks ago with chest pain. Cath by Dr Martinique showed stable disease for medical Rx  Cath 10/25/13 [place] Films reviewed  Coronary angiography:  Coronary dominance: right  Left mainstem: Heavily calcified and tortuous. 40% distal stenosis.  Left anterior descending (LAD): Very complex proximal disease up to 95%. Heavy calcification. The LAD is occluded after the first diagonal and first septal perforator. The first diagonal is a moderate sized vessel with 40% disease.  Left circumflex (LCx): The left circumflex is calcified. There is 30-40% disease in the mid vessel. The first OM appears small and is occluded. The second OM is tiny. The terminal OM has segmental 50-60% disease.  Right coronary artery (RCA): 100% proximal occlusion.  The free radial graft to the OM is 100% occluded at the ostium.  The RIMA graft to the RCA is a large graft and widely patent.  The LIMA graft to the LAD is a large graft and is widely patent. It gives collaterals to an RV marginal branch.  Left ventriculography: Left ventricular systolic function is normal, LVEF is estimated at 50-55%, There is hypokinesis of the mid anterior wall, there is no significant mitral regurgitation  Final Conclusions:  1. Severe 3 vessel occlusive CAD  2. Patent LIMA to the LAD  3. Patent RIMA to the RCA  4. Occluded free radial graft to the OM  5. Good LV function.  Recommendations: Medical management.  Having SSCP at night Not with exercise BP goes up with pain Some relief with nitro . No pain during day when active Doxycyline not started post d/c Has been on these chronically for periodontal disease. In hospital had some type one AV block and verapamil decreased . NO GERD symptoms Loose  stools and migraines some better Seeing Dr Amedeo Plenty [facility] next week   Added low dose ranexa and had him take nitrates at bed time last visit  Seen by [facility] neuro and started on depakote for cluster headaches Verapamil d/c all together  Doing better no chest pain exercising  Ranexa is expensive       ROS: Denies fever, malais, weight loss, blurry vision, decreased visual acuity, cough, sputum, SOB, hemoptysis, pleuritic pain, palpitaitons, heartburn, abdominal pain, melena, lower extremity edema, claudication, or rash.  All other systems reviewed and negative  General: Affect appropriate Healthy:  appears stated age 66: normal Neck supple with no adenopathy JVP normal no bruits no thyromegaly Lungs clear with no wheezing and good diaphragmatic motion Heart:  S1/S2 no murmur, no rub, gallop or click PMI normal Abdomen: benighn, BS positve, no tenderness, no AAA no bruit.  No HSM or HJR Distal pulses intact with no bruits No edema Neuro non-focal Skin warm and dry No muscular weakness   Current Outpatient Prescriptions  Medication Sig Dispense Refill  . acetaminophen (TYLENOL) 325 MG tablet Take 2 tablets (650 mg total) by mouth every 4 (four) hours as needed for headache or mild pain.      Marland Kitchen aspirin EC 81 MG EC tablet Take 1 tablet (81 mg total) by mouth daily.      . clidinium-chlordiazePOXIDE (LIBRAX) 5-2.5 MG per capsule Take 1 capsule by mouth 3 (three)  times daily before meals.  60 capsule  3  . clopidogrel (PLAVIX) 75 MG tablet Take 1 tablet (75 mg total) by mouth daily with breakfast.  90 tablet  3  . divalproex (DEPAKOTE ER) 500 MG 24 hr tablet Take 1 tablet (500 mg total) by mouth daily.  30 tablet  3  . fish oil-omega-3 fatty acids 1000 MG capsule Take 1,200 mg by mouth 2 (two) times daily.       . Glucosamine-Chondroitin (GLUCOSAMINE CHONDR COMPLEX PO) Take 500 mg by mouth 2 (two) times daily. 500 mg 2 times daily       . hydrochlorothiazide (MICROZIDE) 12.5 MG capsule  Take 1 capsule by mouth  daily  90 capsule  1  . indomethacin (INDOCIN) 25 MG capsule Take 1 capsule (25 mg total) by mouth 3 (three) times daily as needed.  30 capsule  0  . isosorbide mononitrate (IMDUR) 30 MG 24 hr tablet Take 1 tablet (30 mg total) by mouth daily.  90 tablet  3  . niacin (NIASPAN) 1000 MG CR tablet Take 1,000 mg by mouth 2 (two) times daily.        . nitroGLYCERIN (NITROSTAT) 0.4 MG SL tablet Place 1 tablet (0.4 mg total) under the tongue every 5 (five) minutes x 3 doses as needed for chest pain.  25 tablet  2  . potassium chloride SA (K-DUR,KLOR-CON) 20 MEQ tablet Take 1 tablet (20 mEq total) by mouth daily.  90 tablet  2  . ramipril (ALTACE) 10 MG capsule Take 1 capsule (10 mg total) by mouth 2 (two) times daily.  60 capsule  11  . ranolazine (RANEXA) 500 MG 12 hr tablet Take 1 tablet (500 mg total) by mouth 2 (two) times daily.  60 tablet  6  . verapamil (CALAN) 120 MG tablet Take 1 tablet (120 mg total) by mouth daily.      Marland Kitchen ZETIA 10 MG tablet Take 1 tablet by mouth  daily  90 tablet  3  . [DISCONTINUED] metoprolol (LOPRESSOR) 50 MG tablet Take 1 tablet (50 mg total) by mouth daily. 1/2 tab po once daily       No current facility-administered medications for this visit.    Allergies  Statins  Electrocardiogram: SR rate 62 LAFB    Assessment and Plan

## 2013-12-12 NOTE — Assessment & Plan Note (Signed)
Runs lower at home on good Rx Low sodium diet monitor

## 2013-12-12 NOTE — Assessment & Plan Note (Signed)
Cholesterol is at goal.  Continue current dose of statin and diet Rx.  No myalgias or side effects.  F/U  LFT's in 6 months. Lab Results  Component Value Date   LDLCALC 131* 09/06/2012             

## 2013-12-12 NOTE — Assessment & Plan Note (Signed)
F/U neuro Cluster headache  On depakote Improved

## 2013-12-12 NOTE — Assessment & Plan Note (Signed)
Stable with no angina and good activity level.  Continue medical Rx Try to stop ranexa in about 6 months due to cost

## 2013-12-12 NOTE — Assessment & Plan Note (Signed)
Maint NSR with no palpitations  

## 2013-12-12 NOTE — Assessment & Plan Note (Signed)
No palpable mass  F/U [facility]  [place] q 2 years

## 2013-12-12 NOTE — Assessment & Plan Note (Signed)
Improved off verapamil  F/U ECG next visit

## 2013-12-12 NOTE — Patient Instructions (Signed)
Your physician wants you to follow-up in:  6 MONTHS WITH DR NISHAN  You will receive a reminder letter in the mail two months in advance. If you don't receive a letter, please call our office to schedule the follow-up appointment. Your physician recommends that you continue on your current medications as directed. Please refer to the Current Medication list given to you today. 

## 2014-01-12 ENCOUNTER — Other Ambulatory Visit: Payer: Self-pay | Admitting: Cardiovascular Disease

## 2014-01-21 ENCOUNTER — Telehealth: Payer: Self-pay | Admitting: Cardiovascular Disease

## 2014-01-21 ENCOUNTER — Other Ambulatory Visit: Payer: Self-pay

## 2014-01-21 MED ORDER — HYDROCHLOROTHIAZIDE 12.5 MG PO CAPS: ORAL_CAPSULE | ORAL | Status: DC

## 2014-01-22 ENCOUNTER — Other Ambulatory Visit: Payer: Self-pay | Admitting: *Deleted

## 2014-01-22 ENCOUNTER — Other Ambulatory Visit (HOSPITAL_COMMUNITY): Payer: Self-pay | Admitting: Cardiology

## 2014-01-22 DIAGNOSIS — I6529 Occlusion and stenosis of unspecified carotid artery: Secondary | ICD-10-CM

## 2014-01-22 MED ORDER — HYDROCHLOROTHIAZIDE 12.5 MG PO CAPS: ORAL_CAPSULE | ORAL | Status: DC

## 2014-01-29 ENCOUNTER — Encounter (INDEPENDENT_AMBULATORY_CARE_PROVIDER_SITE_OTHER): Payer: Self-pay

## 2014-01-29 ENCOUNTER — Encounter: Payer: Self-pay | Admitting: Cardiology

## 2014-01-29 ENCOUNTER — Ambulatory Visit (HOSPITAL_COMMUNITY): Payer: 59 | Attending: Cardiology | Admitting: Cardiology

## 2014-01-29 DIAGNOSIS — I6529 Occlusion and stenosis of unspecified carotid artery: Secondary | ICD-10-CM

## 2014-01-29 NOTE — Progress Notes (Signed)
Carotid duplex complete 

## 2014-02-05 ENCOUNTER — Telehealth: Payer: Self-pay | Admitting: Cardiovascular Disease

## 2014-02-05 NOTE — Telephone Encounter (Signed)
New message ° ° ° ° ° °Pt want echo results °

## 2014-02-06 NOTE — Telephone Encounter (Signed)
LMTCB ./CY 

## 2014-02-14 ENCOUNTER — Encounter: Payer: Self-pay | Admitting: *Deleted

## 2014-02-14 NOTE — Telephone Encounter (Signed)
RESULTS  LETTER MAILED  TO   PT./CY 

## 2014-03-05 ENCOUNTER — Ambulatory Visit: Payer: 59 | Admitting: Neurology

## 2014-04-11 ENCOUNTER — Other Ambulatory Visit: Payer: Self-pay

## 2014-04-11 MED ORDER — RAMIPRIL 10 MG PO CAPS: 10.0000 mg | ORAL_CAPSULE | Freq: Two times a day (BID) | ORAL | Status: DC

## 2014-04-15 ENCOUNTER — Other Ambulatory Visit: Payer: Self-pay | Admitting: *Deleted

## 2014-04-15 DIAGNOSIS — R569 Unspecified convulsions: Secondary | ICD-10-CM

## 2014-04-15 MED ORDER — DIVALPROEX SODIUM ER 500 MG PO TB24: ORAL_TABLET | ORAL | Status: DC

## 2014-05-15 ENCOUNTER — Encounter: Payer: Self-pay | Admitting: Podiatrist

## 2014-05-15 ENCOUNTER — Ambulatory Visit (INDEPENDENT_AMBULATORY_CARE_PROVIDER_SITE_OTHER): Payer: 59 | Admitting: Podiatrist

## 2014-05-15 ENCOUNTER — Ambulatory Visit (INDEPENDENT_AMBULATORY_CARE_PROVIDER_SITE_OTHER): Payer: 59

## 2014-05-15 VITALS — BP 135/73 | HR 50 | Resp 18

## 2014-05-15 DIAGNOSIS — M722 Plantar fascial fibromatosis: Secondary | ICD-10-CM

## 2014-05-15 DIAGNOSIS — R52 Pain, unspecified: Secondary | ICD-10-CM

## 2014-05-15 DIAGNOSIS — M216X9 Other acquired deformities of unspecified foot: Secondary | ICD-10-CM

## 2014-05-15 NOTE — Progress Notes (Signed)
   Subjective:    Patient ID: [name] Brown, male    DOB: 11-Oct-1951, 63 y.o.   MRN: [id number]  HPI the heel on my right foot has been going on for about 5 months and on my feet all day and sore and tender and hurts on the bottom and the left foot hurts on the ball     Review of Systems  Genitourinary: Positive for frequency.  Musculoskeletal:       Joint and muscle pain  Hematological: Bruises/bleeds easily.  All other systems reviewed and are negative.      Objective:   Physical Exam Patient is awake, alert, and oriented x 3.  In no acute distress.  Vascular status is intact with palpable pedal pulses at 2/4 DP and PT bilateral and capillary refill time within normal limits. Neurological sensation is also intact bilaterally via Semmes Weinstein monofilament at 5/5 sites. Light touch, vibratory sensation, Achilles tendon reflex is intact. Dermatological exam reveals skin color, turger and texture as normal. No open lesions present.  Musculature intact with dorsiflexion, plantarflexion, inversion, eversion.  Moderate plantar fasciitis on the left heel is noted. Moderate forefoot pain is also present with pain on palpation along the metatarsal heads. He has had successful treatment with orthotics in the past. Via Dr. Janus Molder    Assessment & Plan:  Assessment: Plantar fasciitis, metatarsalgia  Plan: Recommended new orthotics as he's done very well with orthotics in the past. He was scanned at a prescription she was written. We'll notify him when they're ready for pick up.

## 2014-05-15 NOTE — Patient Instructions (Signed)

## 2014-06-01 ENCOUNTER — Other Ambulatory Visit (HOSPITAL_COMMUNITY): Payer: Self-pay | Admitting: Cardiology

## 2014-06-12 ENCOUNTER — Ambulatory Visit (INDEPENDENT_AMBULATORY_CARE_PROVIDER_SITE_OTHER): Payer: 59 | Admitting: Cardiovascular Disease

## 2014-06-12 ENCOUNTER — Ambulatory Visit: Payer: 59 | Admitting: Cardiovascular Disease

## 2014-06-12 VITALS — BP 130/70 | Ht 70.0 in | Wt 178.0 lb

## 2014-06-12 DIAGNOSIS — E785 Hyperlipidemia, unspecified: Secondary | ICD-10-CM

## 2014-06-12 DIAGNOSIS — I10 Essential (primary) hypertension: Secondary | ICD-10-CM

## 2014-06-12 DIAGNOSIS — I2510 Atherosclerotic heart disease of native coronary artery without angina pectoris: Secondary | ICD-10-CM

## 2014-06-12 DIAGNOSIS — R0989 Other specified symptoms and signs involving the circulatory and respiratory systems: Secondary | ICD-10-CM

## 2014-06-12 DIAGNOSIS — I719 Aortic aneurysm of unspecified site, without rupture: Secondary | ICD-10-CM

## 2014-06-12 DIAGNOSIS — I441 Atrioventricular block, second degree: Secondary | ICD-10-CM

## 2014-06-12 MED ORDER — HYDRALAZINE HCL 50 MG PO TABS: 50.0000 mg | ORAL_TABLET | Freq: Two times a day (BID) | ORAL | Status: DC

## 2014-06-12 NOTE — Assessment & Plan Note (Signed)
In hospital Remains bradycardic off verapamil no indication for pacer but limits choice

## 2014-06-12 NOTE — Patient Instructions (Signed)
Your physician recommends that you schedule a follow-up appointment in: NEXT  AVAILABLE WITH  DR [facility] Your physician has recommended you make the following change in your medication:  START  HYDRALAZINE   50 MG  TWICE   DAILY

## 2014-06-12 NOTE — Assessment & Plan Note (Signed)
No abdominal pain f/u duplex surveillance per [facility]

## 2014-06-12 NOTE — Assessment & Plan Note (Signed)
Cholesterol is at goal.  Continue current dose of statin and diet Rx.  No myalgias or side effects.  F/U  LFT's in 6 months. Lab Results  Component Value Date   LDLCALC 131* 09/06/2012

## 2014-06-12 NOTE — Assessment & Plan Note (Signed)
Stable with no angina and good activity level.  Continue medical Rx  

## 2014-06-12 NOTE — Assessment & Plan Note (Addendum)
Left ICA 60-79% stenosis. 4/15   No TIA symptoms.  Continue antiplatelet Rx and F/U carotid duplex in 6 months  ( read as 83-72% but diastolic velocity .38 )

## 2014-06-12 NOTE — Assessment & Plan Note (Signed)
Start hydralazine in addition to current meds 50 bid

## 2014-06-12 NOTE — Progress Notes (Signed)
Patient ID: [name] Brown, male   DOB: 1951/07/15, 63 y.o.   MRN: [id number] The pt is a 63 y.o. male w/ PMHx significant for CAD s/p CABG x 3 ( LIMA to LAD, RIMA to PDA, L radial to OM1). He is s/p stent in 2008, s/p EVAR 2009 for AAA,  Presented to [facility] 2 weeks ago with chest pain. Cath by Dr Martinique showed stable disease for medical Rx   Cath 10/25/13 [place] Films reviewed   Coronary angiography:  Coronary dominance: right  Left mainstem: Heavily calcified and tortuous. 40% distal stenosis.  Left anterior descending (LAD): Very complex proximal disease up to 95%. Heavy calcification. The LAD is occluded after the first diagonal and first septal perforator. The first diagonal is a moderate sized vessel with 40% disease.  Left circumflex (LCx): The left circumflex is calcified. There is 30-40% disease in the mid vessel. The first OM appears small and is occluded. The second OM is tiny. The terminal OM has segmental 50-60% disease.  Right coronary artery (RCA): 100% proximal occlusion.  The free radial graft to the OM is 100% occluded at the ostium.  The RIMA graft to the RCA is a large graft and widely patent.  The LIMA graft to the LAD is a large graft and is widely patent. It gives collaterals to an RV marginal branch.  Left ventriculography: Left ventricular systolic function is normal, LVEF is estimated at 50-55%, There is hypokinesis of the mid anterior wall, there is no significant mitral regurgitation  Final Conclusions:  1. Severe 3 vessel occlusive CAD  2. Patent LIMA to the LAD  3. Patent RIMA to the RCA  4. Occluded free radial graft to the OM  5. Good LV function.  Recommendations: Medical management.   Intermitant SSCP less on ranexa.  Verapamil d/c due to some AV block  .  Seen by [facility] neuro and started on depakote for cluster headaches       ROS: Denies fever, malais, weight loss, blurry vision, decreased visual acuity, cough, sputum, SOB, hemoptysis, pleuritic pain,  palpitaitons, heartburn, abdominal pain, melena, lower extremity edema, claudication, or rash.  All other systems reviewed and negative  General: Affect appropriate Healthy:  appears stated age 89: normal Neck supple with no adenopathy JVP normal no bruits no thyromegaly Lungs clear with no wheezing and good diaphragmatic motion Heart:  S1/S2 no murmur, no rub, gallop or click PMI normal Abdomen: benighn, BS positve, no tenderness, no AAA no bruit.  No HSM or HJR Distal pulses intact with no bruits No edema Neuro non-focal Skin warm and dry No muscular weakness   Current Outpatient Prescriptions  Medication Sig Dispense Refill  . aspirin EC 81 MG EC tablet Take 1 tablet (81 mg total) by mouth daily.      . clopidogrel (PLAVIX) 75 MG tablet Take 1 tablet (75 mg total) by mouth daily with breakfast.  90 tablet  3  . divalproex (DEPAKOTE ER) 500 MG 24 hr tablet I tabllet by mouth daily  30 tablet  3  . doxycycline (VIBRA-TABS) 100 MG tablet       . fish oil-omega-3 fatty acids 1000 MG capsule Take 1,200 mg by mouth 2 (two) times daily.       . Glucosamine-Chondroitin (GLUCOSAMINE CHONDR COMPLEX PO) Take 500 mg by mouth 2 (two) times daily. 500 mg 2 times daily       . hydrochlorothiazide (MICROZIDE) 12.5 MG capsule Take 1 capsule by mouth  daily  30 capsule  5  . isosorbide mononitrate (IMDUR) 30 MG 24 hr tablet Take 1 tablet (30 mg total) by mouth daily.  90 tablet  3  . niacin (NIASPAN) 1000 MG CR tablet Take 1,000 mg by mouth 2 (two) times daily.        Marland Kitchen NITROSTAT 0.4 MG SL tablet PLACE 1 TABLET UNDER TONGUE EVERY 5 MINUTES FOR 3 DOSES AS NEEDED FOR CHEST PAIN  25 tablet  2  . potassium chloride SA (K-DUR,KLOR-CON) 20 MEQ tablet Take 1 tablet (20 mEq total) by mouth daily.  90 tablet  2  . ramipril (ALTACE) 10 MG capsule Take 1 capsule (10 mg total) by mouth 2 (two) times daily.  60 capsule  6  . ranolazine (RANEXA) 500 MG 12 hr tablet Take 1 tablet (500 mg total) by mouth 2  (two) times daily.  60 tablet  6  . verapamil (CALAN) 120 MG tablet Take 1 tablet (120 mg total) by mouth daily.      Marland Kitchen ZETIA 10 MG tablet Take 1 tablet by mouth  daily  90 tablet  3  . [DISCONTINUED] metoprolol (LOPRESSOR) 50 MG tablet Take 1 tablet (50 mg total) by mouth daily. 1/2 tab po once daily       No current facility-administered medications for this visit.    Allergies  Statins  Electrocardiogram:  1/19  SR LAFB  Possible old IMI  No change form 2014   Assessment and Plan

## 2014-06-13 NOTE — Telephone Encounter (Signed)
error 

## 2014-06-14 ENCOUNTER — Ambulatory Visit: Payer: 59

## 2014-06-14 ENCOUNTER — Telehealth: Payer: Self-pay | Admitting: *Deleted

## 2014-06-14 DIAGNOSIS — M722 Plantar fascial fibromatosis: Secondary | ICD-10-CM

## 2014-06-14 NOTE — Progress Notes (Signed)
Pt is here to [facility] 

## 2014-06-14 NOTE — Patient Instructions (Signed)

## 2014-06-18 MED ORDER — EFINACONAZOLE 10 % EX SOLN: 1.0000 [drp] | Freq: Every day | CUTANEOUS | Status: DC

## 2014-06-18 NOTE — Telephone Encounter (Signed)
It appears like jublia is covered through his insurance so I called it into [facility]-- tell him to check on the price before picking it up and if it is expensive (more than 50 or so dollars) let me know and I can use a different pharmacy. Thanks!

## 2014-06-19 NOTE — Telephone Encounter (Signed)
I called and informed him that Dr. Valentina Lucks ordered him some Jublia for his toenails.  He stated okay, I'll go by to pick it up.  I told him she wants you to see how much it costs before you pick it up.  If it costs more than $50 let her know and she can send it to a different pharmacy.  He stated, okay I sure will.  Thanks for calling.

## 2014-07-16 ENCOUNTER — Telehealth: Payer: Self-pay | Admitting: *Deleted

## 2014-07-16 MED ORDER — EFINACONAZOLE 10 % EX SOLN: 1.0000 [drp] | Freq: Every day | CUTANEOUS | Status: DC

## 2014-07-16 NOTE — Telephone Encounter (Signed)
I'm calling for my husband.  He was in there a while ago to see one of the doctors and the were prescribing something for his toes.  I think a fungus and we have not heard anything back on that, if insurance approved or didn't.  Please give me a call and let me know what's going on.  I called and informed her that we had called it into [facility].  She stated yes but it needed authorization by the insurance company.  I told her we would try to send it to [place] out of [place].  It should be delivered to your home by mail.  She stated, "Oh okay, should I give it about a week to be delivered?"  I told her yes.

## 2014-07-17 ENCOUNTER — Encounter: Payer: Self-pay | Admitting: Cardiovascular Disease

## 2014-07-17 ENCOUNTER — Ambulatory Visit (INDEPENDENT_AMBULATORY_CARE_PROVIDER_SITE_OTHER): Payer: 59 | Admitting: Cardiovascular Disease

## 2014-07-17 VITALS — BP 134/68 | HR 55 | Ht 70.0 in | Wt 177.0 lb

## 2014-07-17 DIAGNOSIS — I10 Essential (primary) hypertension: Secondary | ICD-10-CM

## 2014-07-17 DIAGNOSIS — E785 Hyperlipidemia, unspecified: Secondary | ICD-10-CM

## 2014-07-17 DIAGNOSIS — R001 Bradycardia, unspecified: Secondary | ICD-10-CM

## 2014-07-17 DIAGNOSIS — I480 Paroxysmal atrial fibrillation: Secondary | ICD-10-CM

## 2014-07-17 DIAGNOSIS — Z951 Presence of aortocoronary bypass graft: Secondary | ICD-10-CM

## 2014-07-17 NOTE — Assessment & Plan Note (Signed)
Cholesterol is at goal.  Continue current dose of statin and diet Rx.  No myalgias or side effects.  F/U  LFT's in 6 months. Lab Results  Component Value Date   LDLCALC 131* 09/06/2012

## 2014-07-17 NOTE — Assessment & Plan Note (Signed)
maint NSR with no palpitations Stable

## 2014-07-17 NOTE — Assessment & Plan Note (Signed)
Stable with no angina and good activity level.  Continue medical Rx  

## 2014-07-17 NOTE — Progress Notes (Signed)
Patient ID: [name] Brown, male   DOB: 21-Jan-1951, 63 y.o.   MRN: [id number] The pt is a 63 y.o. male w/ PMHx significant for CAD s/p CABG x 3 ( LIMA to LAD, RIMA to PDA, L radial to OM1). He is s/p stent in 2008, s/p EVAR 2009 for AAA,  Presented to [facility] 2 weeks ago with chest pain. Cath by Dr Martinique showed stable disease for medical Rx  Cath 10/25/13 Martinique Films reviewed  Coronary angiography:  Coronary dominance: right  Left mainstem: Heavily calcified and tortuous. 40% distal stenosis.  Left anterior descending (LAD): Very complex proximal disease up to 95%. Heavy calcification. The LAD is occluded after the first diagonal and first septal perforator. The first diagonal is a moderate sized vessel with 40% disease.  Left circumflex (LCx): The left circumflex is calcified. There is 30-40% disease in the mid vessel. The first OM appears small and is occluded. The second OM is tiny. The terminal OM has segmental 50-60% disease.  Right coronary artery (RCA): 100% proximal occlusion.  The free radial graft to the OM is 100% occluded at the ostium.  The RIMA graft to the RCA is a large graft and widely patent.  The LIMA graft to the LAD is a large graft and is widely patent. It gives collaterals to an RV marginal branch.  Left ventriculography: Left ventricular systolic function is normal, LVEF is estimated at 50-55%, There is hypokinesis of the mid anterior wall, there is no significant mitral regurgitation  Final Conclusions:  1. Severe 3 vessel occlusive CAD  2. Patent LIMA to the LAD  3. Patent RIMA to the RCA  4. Occluded free radial graft to the OM  5. Good LV function.  Recommendations: Medical management.  Intermitant SSCP less on ranexa. 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 Seen by [facility] neuro and started on depakote for cluster headaches    BP better controlled with hydralazine added last visit    ROS: Denies fever, malais, weight loss, blurry vision, decreased visual acuity,  cough, sputum, SOB, hemoptysis, pleuritic pain, palpitaitons, heartburn, abdominal pain, melena, lower extremity edema, claudication, or rash.  All other systems reviewed and negative  General: Affect appropriate Healthy:  appears stated age 49: normal poor dentition  Neck supple with no adenopathy JVP normal no bruits no thyromegaly Lungs clear with no wheezing and good diaphragmatic motion Heart:  S1/S2 no murmur, no rub, gallop or click PMI normal Abdomen: benighn, BS positve, no tenderness, no AAA no bruit.  No HSM or HJR Distal pulses intact with no bruits No edema Neuro non-focal Skin warm and dry No muscular weakness   Current Outpatient Prescriptions  Medication Sig Dispense Refill  . aspirin EC 81 MG EC tablet Take 1 tablet (81 mg total) by mouth daily.      . clopidogrel (PLAVIX) 75 MG tablet Take 1 tablet (75 mg total) by mouth daily with breakfast.  90 tablet  3  . divalproex (DEPAKOTE ER) 500 MG 24 hr tablet I tabllet by mouth daily  30 tablet  3  . doxycycline (VIBRA-TABS) 100 MG tablet       . Efinaconazole 10 % SOLN Apply 1 drop topically daily.  8 mL  2  . fish oil-omega-3 fatty acids 1000 MG capsule Take 1,200 mg by mouth 2 (two) times daily.       . Glucosamine-Chondroitin (GLUCOSAMINE CHONDR COMPLEX PO) Take 500 mg by mouth 2 (two) times daily. 500 mg 2 times daily       .  hydrALAZINE (APRESOLINE) 50 MG tablet Take 1 tablet (50 mg total) by mouth 2 (two) times daily.  180 tablet  3  . hydrochlorothiazide (MICROZIDE) 12.5 MG capsule Take 1 capsule by mouth  daily  30 capsule  5  . isosorbide mononitrate (IMDUR) 30 MG 24 hr tablet Take 1 tablet (30 mg total) by mouth daily.  90 tablet  3  . niacin (NIASPAN) 1000 MG CR tablet Take 1,000 mg by mouth 2 (two) times daily.        Marland Kitchen NITROSTAT 0.4 MG SL tablet PLACE 1 TABLET UNDER TONGUE EVERY 5 MINUTES FOR 3 DOSES AS NEEDED FOR CHEST PAIN  25 tablet  2  . potassium chloride SA (K-DUR,KLOR-CON) 20 MEQ tablet Take 1  tablet (20 mEq total) by mouth daily.  90 tablet  2  . Prenatal Vit-Fe Fumarate-FA (M-VIT) tablet Take 1 tablet by mouth daily.      . ramipril (ALTACE) 10 MG capsule Take 1 capsule (10 mg total) by mouth 2 (two) times daily.  60 capsule  6  . ranolazine (RANEXA) 500 MG 12 hr tablet Take 500 mg by mouth daily.      . verapamil (CALAN) 120 MG tablet Take 1 tablet (120 mg total) by mouth daily.      Marland Kitchen ZETIA 10 MG tablet Take 1 tablet by mouth  daily  90 tablet  3  . [DISCONTINUED] metoprolol (LOPRESSOR) 50 MG tablet Take 1 tablet (50 mg total) by mouth daily. 1/2 tab po once daily       No current facility-administered medications for this visit.    Allergies  Statins  Electrocardiogram:  SR possible old IMI no change from 2014   Assessment and Plan

## 2014-07-17 NOTE — Patient Instructions (Signed)
Your physician wants you to follow-up in:   3 MONTHS WITH  DR NISHAN  You will receive a reminder letter in the mail two months in advance. If you don't receive a letter, please call our office to schedule the follow-up appointment. Your physician recommends that you continue on your current medications as directed. Please refer to the Current Medication list given to you today.  

## 2014-07-17 NOTE — Assessment & Plan Note (Signed)
Improved with hydralazine.  Continue bid for now  F/u 3 months

## 2014-07-17 NOTE — Assessment & Plan Note (Signed)
Asymptomatic avoid beta blocker  ECG stable

## 2014-07-22 ENCOUNTER — Other Ambulatory Visit: Payer: Self-pay

## 2014-07-22 MED ORDER — EZETIMIBE 10 MG PO TABS: ORAL_TABLET | ORAL | Status: DC

## 2014-07-23 ENCOUNTER — Other Ambulatory Visit (HOSPITAL_COMMUNITY): Payer: Self-pay | Admitting: Cardiology

## 2014-07-25 ENCOUNTER — Other Ambulatory Visit: Payer: Self-pay

## 2014-07-25 MED ORDER — HYDROCHLOROTHIAZIDE 12.5 MG PO CAPS: ORAL_CAPSULE | ORAL | Status: DC

## 2014-08-05 ENCOUNTER — Other Ambulatory Visit: Payer: Self-pay | Admitting: Neurology

## 2014-08-07 ENCOUNTER — Other Ambulatory Visit: Payer: Self-pay | Admitting: Neurology

## 2014-08-14 ENCOUNTER — Ambulatory Visit (INDEPENDENT_AMBULATORY_CARE_PROVIDER_SITE_OTHER): Payer: 59 | Admitting: Podiatrist

## 2014-08-14 ENCOUNTER — Encounter: Payer: Self-pay | Admitting: Podiatrist

## 2014-08-14 DIAGNOSIS — M722 Plantar fascial fibromatosis: Secondary | ICD-10-CM

## 2014-08-20 NOTE — Progress Notes (Signed)
[name] Brown presents today for follow-up orthotics. He states that the forefoot region of the foot continued to be painful. He would like to know if any padding be added to his orthotics.  Objective: Orthotics very minimal padding at forefoot. We will send these back and had padding.: Is ready for pickup. If that time there is still uncomfortable I can add an accommodative pad for offloading.

## 2014-09-02 ENCOUNTER — Telehealth: Payer: Self-pay | Admitting: *Deleted

## 2014-09-02 NOTE — Telephone Encounter (Signed)
Pt's wife asked status of pt's orthotics.  Caryl Pina checked with [facility] stated he did not know what happened, but the orthotics would ship out 09/02/2014.

## 2014-09-09 ENCOUNTER — Telehealth: Payer: Self-pay | Admitting: *Deleted

## 2014-09-09 DIAGNOSIS — M79673 Pain in unspecified foot: Secondary | ICD-10-CM

## 2014-09-09 NOTE — Telephone Encounter (Signed)
Pt's wife, Eustaquio Maize asked if pt's orthotics are in.  I left a message informing her the orthotics could be picked up at the back checkout desk.

## 2014-09-11 ENCOUNTER — Ambulatory Visit (INDEPENDENT_AMBULATORY_CARE_PROVIDER_SITE_OTHER): Payer: Managed Care, Other (non HMO) | Admitting: Podiatry

## 2014-09-11 ENCOUNTER — Encounter: Payer: Self-pay | Admitting: Podiatry

## 2014-09-11 VITALS — BP 147/82 | HR 60 | Resp 16

## 2014-09-11 DIAGNOSIS — M2042 Other hammer toe(s) (acquired), left foot: Secondary | ICD-10-CM

## 2014-09-11 DIAGNOSIS — M779 Enthesopathy, unspecified: Secondary | ICD-10-CM

## 2014-09-11 MED ORDER — TRIAMCINOLONE ACETONIDE 10 MG/ML IJ SUSP
10.0000 mg | Freq: Once | INTRAMUSCULAR | Status: AC
  Administered 2014-09-11: 10 mg

## 2014-09-11 NOTE — Progress Notes (Signed)
Subjective:     Patient ID: [name] Brown, male   DOB: 1950-11-11, 63 y.o.   MRN: [id number]  HPI patient presents stating my foot is really bothering me in the joint and I developed this corn that become very sore   Review of Systems     Objective:   Physical Exam Neurovascular status intact with muscle strength adequate range of motion within normal limits and found to have an elevated second toe with moderate rigid contracture and inflammation the second MPJ left with keratotic lesion on top of the second metatarsal    Assessment:     Inflammatory capsulitis second MPJ left with keratotic lesion formation secondary to plantar pressure    Plan:     Reviewed condition and at this time did a proximal nerve block aspirated the joint after explaining the risk of this and injected with a quarter cc of dexamethasone Kenalog and debrided lesion and instructed we will see him when symptoms were to reoccur or if any issues should occur

## 2014-09-19 ENCOUNTER — Encounter (HOSPITAL_COMMUNITY): Payer: Self-pay | Admitting: Cardiology

## 2014-10-14 NOTE — Progress Notes (Signed)
Patient ID: [name] Brown, male   DOB: 1951/10/03, 64 y.o.   MRN: [id number] The pt is a 64 y.o. male w/ PMHx significant for CAD s/p CABG x 3 ( LIMA to LAD, RIMA to PDA, L radial to OM1). He is s/p stent in 2008, s/p EVAR 2009 for AAA,  Presented to [facility] 1/15 with chest pain. Cath by Dr Martinique showed stable disease for medical Rx   Cath 10/25/13 Martinique Films reviewed  Coronary angiography:  Coronary dominance: right  Left mainstem: Heavily calcified and tortuous. 40% distal stenosis.  Left anterior descending (LAD): Very complex proximal disease up to 95%. Heavy calcification. The LAD is occluded after the first diagonal and first septal perforator. The first diagonal is a moderate sized vessel with 40% disease.  Left circumflex (LCx): The left circumflex is calcified. There is 30-40% disease in the mid vessel. The first OM appears small and is occluded. The second OM is tiny. The terminal OM has segmental 50-60% disease.  Right coronary artery (RCA): 100% proximal occlusion.  The free radial graft to the OM is 100% occluded at the ostium.  The RIMA graft to the RCA is a large graft and widely patent.  The LIMA graft to the LAD is a large graft and is widely patent. It gives collaterals to an RV marginal branch.  Left ventriculography: Left ventricular systolic function is normal, LVEF is estimated at 50-55%, There is hypokinesis of the mid anterior wall, there is no significant mitral regurgitation  Final Conclusions:  1. Severe 3 vessel occlusive CAD  2. Patent LIMA to the LAD  3. Patent RIMA to the RCA  4. Occluded free radial graft to the OM  5. Good LV function.  Recommendations: Medical management.   Intermitant SSCP less on ranexa. 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 Seen by [facility] neuro and started on depakote for cluster headaches    BP better controlled with hydralazine added last visit    ROS: Denies fever, malais, weight loss, blurry vision, decreased visual acuity, cough,  sputum, SOB, hemoptysis, pleuritic pain, palpitaitons, heartburn, abdominal pain, melena, lower extremity edema, claudication, or rash.  All other systems reviewed and negative  General: Affect appropriate Healthy:  appears stated age 15: normal poor dentition  Neck supple with no adenopathy JVP normal bilateral bruits no thyromegaly Lungs clear with no wheezing and good diaphragmatic motion Heart:  S1/S2 SEM murmur, no rub, gallop or click PMI normal Abdomen: benighn, BS positve, no tenderness, no AAA no bruit.  No HSM or HJR Distal pulses intact with no bruits left radial harvested  No edema Neuro non-focal Skin warm and dry No muscular weakness   Current Outpatient Prescriptions  Medication Sig Dispense Refill  . aspirin EC 81 MG EC tablet Take 1 tablet (81 mg total) by mouth daily.    . clopidogrel (PLAVIX) 75 MG tablet Take 1 tablet (75 mg total) by mouth daily with breakfast. 90 tablet 3  . divalproex (DEPAKOTE ER) 500 MG 24 hr tablet TAKE 1 TABLET BY MOUTH EVERY DAY 30 tablet 3  . doxycycline (VIBRA-TABS) 100 MG tablet     . Efinaconazole 10 % SOLN Apply 1 drop topically daily. 8 mL 2  . ezetimibe (ZETIA) 10 MG tablet Take 1 tablet by mouth  daily 90 tablet 3  . fish oil-omega-3 fatty acids 1000 MG capsule Take 1,200 mg by mouth 2 (two) times daily.     . Glucosamine-Chondroitin (GLUCOSAMINE CHONDR COMPLEX PO) Take 500 mg by  mouth 2 (two) times daily. 500 mg 2 times daily     . hydrALAZINE (APRESOLINE) 50 MG tablet Take 1 tablet (50 mg total) by mouth 2 (two) times daily. 180 tablet 3  . hydrochlorothiazide (MICROZIDE) 12.5 MG capsule Take 1 capsule by mouth  daily 30 capsule 5  . isosorbide mononitrate (IMDUR) 30 MG 24 hr tablet Take 1 tablet (30 mg total) by mouth daily. 90 tablet 3  . KLOR-CON M20 20 MEQ tablet TAKE 1 TABLET EVERY DAY 90 tablet 2  . niacin (NIASPAN) 1000 MG CR tablet Take 1,000 mg by mouth 2 (two) times daily.      Marland Kitchen NITROSTAT 0.4 MG SL tablet PLACE 1  TABLET UNDER TONGUE EVERY 5 MINUTES FOR 3 DOSES AS NEEDED FOR CHEST PAIN 25 tablet 2  . Prenatal Vit-Fe Fumarate-FA (M-VIT) tablet Take 1 tablet by mouth daily.    . ramipril (ALTACE) 10 MG capsule Take 1 capsule (10 mg total) by mouth 2 (two) times daily. 60 capsule 6  . ranolazine (RANEXA) 500 MG 12 hr tablet Take 500 mg by mouth daily.    . verapamil (CALAN) 120 MG tablet Take 1 tablet (120 mg total) by mouth daily.    . [DISCONTINUED] metoprolol (LOPRESSOR) 50 MG tablet Take 1 tablet (50 mg total) by mouth daily. 1/2 tab po once daily     No current facility-administered medications for this visit.    Allergies  Statins  Electrocardiogram:  07/17/14  SR possible old IMI no change from 2014  Today 10/15/14 SR rate 59 PR 224  LAFB   Assessment and Plan

## 2014-10-15 ENCOUNTER — Encounter: Payer: Self-pay | Admitting: Cardiovascular Disease

## 2014-10-15 ENCOUNTER — Ambulatory Visit (INDEPENDENT_AMBULATORY_CARE_PROVIDER_SITE_OTHER): Payer: 59 | Admitting: Cardiovascular Disease

## 2014-10-15 VITALS — BP 142/84 | HR 59 | Ht 70.0 in | Wt 184.6 lb

## 2014-10-15 DIAGNOSIS — I719 Aortic aneurysm of unspecified site, without rupture: Secondary | ICD-10-CM

## 2014-10-15 DIAGNOSIS — R0989 Other specified symptoms and signs involving the circulatory and respiratory systems: Secondary | ICD-10-CM

## 2014-10-15 DIAGNOSIS — Z951 Presence of aortocoronary bypass graft: Secondary | ICD-10-CM

## 2014-10-15 NOTE — Assessment & Plan Note (Signed)
Left ICA 60-79% stenosis.  No TIA symptoms.  Continue antiplatelet Rx and F/U carotid duplex in 6 months  ASA and Plavix

## 2014-10-15 NOTE — Assessment & Plan Note (Signed)
F/U Gae Gallop no palpable mass will be due for [place]

## 2014-10-15 NOTE — Patient Instructions (Signed)
Your physician wants you to follow-up in:  6 MONTHS WITH DR NISHAN  You will receive a reminder letter in the mail two months in advance. If you don't receive a letter, please call our office to schedule the follow-up appointment. Your physician recommends that you continue on your current medications as directed. Please refer to the Current Medication list given to you today. 

## 2014-10-15 NOTE — Assessment & Plan Note (Signed)
Cath 1/15 with occluded OM graft arterial conduits patent done well with medical Rx including ranexa

## 2014-10-21 ENCOUNTER — Other Ambulatory Visit: Payer: Self-pay | Admitting: Cardiology

## 2014-10-30 ENCOUNTER — Other Ambulatory Visit: Payer: Self-pay

## 2014-10-30 MED ORDER — CLOPIDOGREL BISULFATE 75 MG PO TABS: 75.0000 mg | ORAL_TABLET | Freq: Every day | ORAL | Status: DC

## 2014-11-10 ENCOUNTER — Other Ambulatory Visit: Payer: Self-pay | Admitting: Cardiovascular Disease

## 2014-11-18 ENCOUNTER — Encounter: Payer: Self-pay | Admitting: Vascular Surgery

## 2014-11-19 ENCOUNTER — Other Ambulatory Visit: Payer: Self-pay | Admitting: *Deleted

## 2014-11-19 DIAGNOSIS — Z95828 Presence of other vascular implants and grafts: Secondary | ICD-10-CM

## 2014-11-20 ENCOUNTER — Ambulatory Visit (HOSPITAL_COMMUNITY)
Admission: RE | Admit: 2014-11-20 | Discharge: 2014-11-20 | Disposition: A | Discharge status: Home / Self Care | Payer: PRIVATE HEALTH INSURANCE | Source: Ambulatory Visit | Attending: Vascular Surgery | Admitting: Vascular Surgery

## 2014-11-20 ENCOUNTER — Ambulatory Visit (INDEPENDENT_AMBULATORY_CARE_PROVIDER_SITE_OTHER): Payer: PRIVATE HEALTH INSURANCE | Admitting: Vascular Surgery

## 2014-11-20 ENCOUNTER — Encounter: Payer: Self-pay | Admitting: Vascular Surgery

## 2014-11-20 VITALS — BP 126/70 | HR 51 | Ht 70.0 in | Wt 179.0 lb

## 2014-11-20 DIAGNOSIS — I714 Abdominal aortic aneurysm, without rupture, unspecified: Secondary | ICD-10-CM

## 2014-11-20 DIAGNOSIS — Z95828 Presence of other vascular implants and grafts: Secondary | ICD-10-CM

## 2014-11-20 DIAGNOSIS — Z48812 Encounter for surgical aftercare following surgery on the circulatory system: Secondary | ICD-10-CM | POA: Diagnosis present

## 2014-11-20 NOTE — Progress Notes (Signed)
[facility] [place]  Patient name: [name] Brown MRN: [id number] DOB: 1950/10/26 Sex: male  REASON FOR VISIT: Follow up after endovascular aneurysm repair  HPI: [name] Brown is a 64 y.o. male who I last saw on 05/16/2013. He underwent EVAR in February 2009 for a 5.3 cm infrarenal abdominal aortic aneurysm. At the time of his last visit, duplex scan showed that the maximum diameter was 3.2 cm in maximum diameter. At that point I felt it was safe to extend his follow up out to 18 months. Since I saw him last, he denies any history of abdominal pain or back pain.  He did have a myocardial infarction in January 2015. However since then he's had no chest pain or cardiac problems. He is followed by Dr. Jenkins Rouge.   Past Medical History  Diagnosis Date  . Bradycardia   . Hyperlipidemia   . HTN (hypertension)   . CAD (coronary artery disease) CABG 03/2000    Median sternotomy for coronary artery bypass grafting x 3 (left  . Aortic aneurysm Endograf 2009  . Myocardial infarction    Family History  Problem Relation Age of Onset  . Heart attack Mother   . Heart disease Mother   . Heart attack Father   . Heart disease Father    SOCIAL HISTORY: History  Substance Use Topics  . Smoking status: Former Smoker -- 2.00 packs/day for 30 years    Types: Cigarettes    Quit date: 01/05/1992  . Smokeless tobacco: Never Used     Comment: quit 19 yrs ago  . Alcohol Use: No   Allergies  Allergen Reactions  . Statins     Myalgias with elevated CPKs   Current Outpatient Prescriptions  Medication Sig Dispense Refill  . aspirin EC 81 MG EC tablet Take 1 tablet (81 mg total) by mouth daily.    . clopidogrel (PLAVIX) 75 MG tablet Take 1 tablet (75 mg total) by mouth daily with breakfast. 90 tablet 3  . divalproex (DEPAKOTE ER) 500 MG 24 hr tablet TAKE 1 TABLET BY MOUTH EVERY DAY 30 tablet 3  . doxycycline (VIBRA-TABS) 100 MG tablet     . Efinaconazole 10 % SOLN Apply  1 drop topically daily. 8 mL 2  . ezetimibe (ZETIA) 10 MG tablet Take 1 tablet by mouth  daily 90 tablet 3  . fish oil-omega-3 fatty acids 1000 MG capsule Take 1,200 mg by mouth 2 (two) times daily.     . Glucosamine-Chondroitin (GLUCOSAMINE CHONDR COMPLEX PO) Take 500 mg by mouth 2 (two) times daily. 500 mg 2 times daily     . hydrALAZINE (APRESOLINE) 50 MG tablet Take 1 tablet (50 mg total) by mouth 2 (two) times daily. 180 tablet 3  . hydrochlorothiazide (MICROZIDE) 12.5 MG capsule Take 1 capsule by mouth  daily 30 capsule 5  . isosorbide mononitrate (IMDUR) 30 MG 24 hr tablet TAKE 1 TABLET BY MOUTH EVERY DAY 90 tablet 1  . KLOR-CON M20 20 MEQ tablet TAKE 1 TABLET EVERY DAY 90 tablet 2  . niacin (NIASPAN) 1000 MG CR tablet Take 1,000 mg by mouth 2 (two) times daily.      Marland Kitchen NITROSTAT 0.4 MG SL tablet PLACE 1 TABLET UNDER TONGUE EVERY 5 MINUTES FOR 3 DOSES AS NEEDED FOR CHEST PAIN 25 tablet 2  . Prenatal Vit-Fe Fumarate-FA (M-VIT) tablet Take 1 tablet by mouth daily.    . ramipril (ALTACE) 10 MG capsule TAKE ONE CAPSULE BY MOUTH  TWICE A DAY 60 capsule 5  . ranolazine (RANEXA) 500 MG 12 hr tablet Take 500 mg by mouth daily.    . [DISCONTINUED] metoprolol (LOPRESSOR) 50 MG tablet Take 1 tablet (50 mg total) by mouth daily. 1/2 tab po once daily     No current facility-administered medications for this visit.   REVIEW OF SYSTEMS: Valu.Nieves ] denotes positive finding; [  ] denotes negative finding  CARDIOVASCULAR:  [ ]  chest pain   [ ]  chest pressure   [ ]  palpitations   [ ]  orthopnea   [ ]  dyspnea on exertion   [ ]  claudication   [ ]  rest pain   [ ]  DVT   [ ]  phlebitis PULMONARY:   [ ]  productive cough   [ ]  asthma   [ ]  wheezing NEUROLOGIC:   [ ]  weakness  [ ]  paresthesias  [ ]  aphasia  [ ]  amaurosis  [ ]  dizziness HEMATOLOGIC:   [ ]  bleeding problems   [ ]  clotting disorders MUSCULOSKELETAL:  [ ]  joint pain   [ ]  joint swelling [ ]  leg swelling GASTROINTESTINAL: [ ]   blood in stool  [ ]    hematemesis GENITOURINARY:  [ ]   dysuria  [ ]   hematuria PSYCHIATRIC:  [ ]  history of major depression INTEGUMENTARY:  [ ]  rashes  [ ]  ulcers CONSTITUTIONAL:  [ ]  fever   [ ]  chills  PHYSICAL EXAM: Filed Vitals:   11/20/14 0904  BP: 126/70  Pulse: 51  Height: 5\' 10"  (1.778 m)  Weight: 179 lb (81.194 kg)  SpO2: 98%   Body mass index is 25.68 kg/(m^2). GENERAL: The patient is a well-nourished male, in no acute distress. The vital signs are documented above. CARDIOVASCULAR: There is a regular rate and rhythm. I do not detect carotid bruits. He has palpable femoral, posterior tibial, and dorsalis pedis pulses bilaterally. He has no significant lower extremity swelling. PULMONARY: There is good air exchange bilaterally without wheezing or rales. ABDOMEN: Soft and non-tender with normal pitched bowel sounds.  MUSCULOSKELETAL: There are no major deformities or cyanosis. NEUROLOGIC: No focal weakness or paresthesias are detected. SKIN: There are no ulcers or rashes noted. PSYCHIATRIC: The patient has a normal affect.  DATA:  I have independently interpreted his abdominal aortic duplex today. This shows that the maximum diameter of his aneurysm is 3.2 cm and has not changed in the last 18 months.  MEDICAL ISSUES: The patient is doing well status post EVAR in 2009. The aneurysm has decreased in size and now remains stable at 3.2 cm. I'll see him back in 18 months with a follow up duplex scan. If the aneurysm remains stable I think it would be safe to stretch his follow up out to 2 years.   Return in about 18 months (around 05/20/2016).  [place]: [phone]

## 2014-11-20 NOTE — Addendum Note (Signed)
Addended by: Mena Goes on: 11/20/2014 10:55 AM   Modules accepted: Orders

## 2015-01-16 ENCOUNTER — Other Ambulatory Visit: Payer: Self-pay | Admitting: Cardiovascular Disease

## 2015-01-16 NOTE — Telephone Encounter (Signed)
Josue Hector, MD at 10/14/2014 1:37 PM  hydrochlorothiazide (MICROZIDE) 12.5 MG capsuleTake 1 capsule by mouth daily  Your physician recommends that you continue on your current medications as directed. Please refer to the Current Medication list given to you today

## 2015-01-20 ENCOUNTER — Other Ambulatory Visit (HOSPITAL_COMMUNITY): Payer: Self-pay | Admitting: Cardiology

## 2015-01-20 DIAGNOSIS — I6523 Occlusion and stenosis of bilateral carotid arteries: Secondary | ICD-10-CM

## 2015-01-21 ENCOUNTER — Other Ambulatory Visit: Payer: Self-pay | Admitting: Cardiovascular Disease

## 2015-01-30 ENCOUNTER — Ambulatory Visit (HOSPITAL_COMMUNITY): Payer: PRIVATE HEALTH INSURANCE | Attending: Cardiology | Admitting: Cardiology

## 2015-01-30 DIAGNOSIS — E785 Hyperlipidemia, unspecified: Secondary | ICD-10-CM | POA: Diagnosis not present

## 2015-01-30 DIAGNOSIS — I6523 Occlusion and stenosis of bilateral carotid arteries: Secondary | ICD-10-CM

## 2015-01-30 DIAGNOSIS — R0989 Other specified symptoms and signs involving the circulatory and respiratory systems: Secondary | ICD-10-CM | POA: Diagnosis not present

## 2015-01-30 DIAGNOSIS — Z87891 Personal history of nicotine dependence: Secondary | ICD-10-CM | POA: Diagnosis not present

## 2015-01-30 DIAGNOSIS — Z951 Presence of aortocoronary bypass graft: Secondary | ICD-10-CM | POA: Insufficient documentation

## 2015-01-30 DIAGNOSIS — I10 Essential (primary) hypertension: Secondary | ICD-10-CM | POA: Insufficient documentation

## 2015-01-30 DIAGNOSIS — I714 Abdominal aortic aneurysm, without rupture: Secondary | ICD-10-CM | POA: Diagnosis not present

## 2015-01-30 DIAGNOSIS — I2510 Atherosclerotic heart disease of native coronary artery without angina pectoris: Secondary | ICD-10-CM | POA: Insufficient documentation

## 2015-01-30 NOTE — Progress Notes (Signed)
Carotid duplex performed 

## 2015-02-03 ENCOUNTER — Telehealth: Payer: Self-pay | Admitting: Cardiovascular Disease

## 2015-02-03 NOTE — Telephone Encounter (Signed)
New message      Do you have any recommendatins for a PCP in the [place] group?

## 2015-02-03 NOTE — Telephone Encounter (Signed)
Calling wanting to change his PCP.  States he would like a referral to physician in [facility]. Spoke w/Dr. Johnsie Cancel who states he does not have a preference with [facility].  He suggested [facility]- Dr. Ardeth Perfect.  Gave him [facility] [place] phone number [phone]; and [facility] phone number [phone].  He states [facility] is closer to him so will call them.

## 2015-02-17 ENCOUNTER — Encounter: Payer: Self-pay | Admitting: *Deleted

## 2015-02-21 ENCOUNTER — Other Ambulatory Visit (INDEPENDENT_AMBULATORY_CARE_PROVIDER_SITE_OTHER): Payer: PRIVATE HEALTH INSURANCE

## 2015-02-21 ENCOUNTER — Ambulatory Visit (INDEPENDENT_AMBULATORY_CARE_PROVIDER_SITE_OTHER): Payer: PRIVATE HEALTH INSURANCE | Admitting: Internal Medicine

## 2015-02-21 ENCOUNTER — Encounter: Payer: Self-pay | Admitting: Internal Medicine

## 2015-02-21 VITALS — BP 128/80 | HR 52 | Temp 97.8°F | Resp 14 | Ht 70.0 in | Wt 180.0 lb

## 2015-02-21 DIAGNOSIS — R252 Cramp and spasm: Secondary | ICD-10-CM | POA: Diagnosis not present

## 2015-02-21 DIAGNOSIS — S46819A Strain of other muscles, fascia and tendons at shoulder and upper arm level, unspecified arm, initial encounter: Secondary | ICD-10-CM | POA: Insufficient documentation

## 2015-02-21 DIAGNOSIS — I10 Essential (primary) hypertension: Secondary | ICD-10-CM | POA: Diagnosis not present

## 2015-02-21 DIAGNOSIS — S46812A Strain of other muscles, fascia and tendons at shoulder and upper arm level, left arm, initial encounter: Secondary | ICD-10-CM

## 2015-02-21 LAB — CBC
HEMATOCRIT: 40.7 % (ref 39.0–52.0)
Hemoglobin: 13.9 g/dL (ref 13.0–17.0)
MCHC: 34.1 g/dL (ref 30.0–36.0)
MCV: 95.8 fl (ref 78.0–100.0)
Platelets: 236 10*3/uL (ref 150.0–400.0)
RBC: 4.24 Mil/uL (ref 4.22–5.81)
RDW: 14.7 % (ref 11.5–15.5)
WBC: 7 10*3/uL (ref 4.0–10.5)

## 2015-02-21 LAB — COMPREHENSIVE METABOLIC PANEL
ALBUMIN: 4.2 g/dL (ref 3.5–5.2)
ALT: 17 U/L (ref 0–53)
AST: 21 U/L (ref 0–37)
Alkaline Phosphatase: 45 U/L (ref 39–117)
BUN: 17 mg/dL (ref 6–23)
CALCIUM: 9.8 mg/dL (ref 8.4–10.5)
CHLORIDE: 99 meq/L (ref 96–112)
CO2: 29 mEq/L (ref 19–32)
CREATININE: 0.93 mg/dL (ref 0.40–1.50)
GFR: 87.17 mL/min (ref >60.00)
Glucose, Bld: 92 mg/dL (ref 70–99)
Potassium: 4.5 mEq/L (ref 3.5–5.1)
Sodium: 134 mEq/L — ABNORMAL LOW (ref 135–145)
TOTAL PROTEIN: 6.8 g/dL (ref 6.0–8.3)
Total Bilirubin: 0.5 mg/dL (ref 0.2–1.2)

## 2015-02-21 LAB — MAGNESIUM: Magnesium: 2.2 mg/dL (ref 1.5–2.5)

## 2015-02-21 LAB — CK: CK TOTAL: 102 U/L (ref 7–232)

## 2015-02-21 MED ORDER — DICLOFENAC SODIUM 1 % TD GEL: 2.0000 g | Freq: Four times a day (QID) | TRANSDERMAL | Status: DC

## 2015-02-21 NOTE — Progress Notes (Signed)
Left message for patient to call back to discuss lab results. 

## 2015-02-21 NOTE — Assessment & Plan Note (Signed)
BP controlled on his current hydralazine, imdur, hctz, ramipril, ranolazine. Will check labs today since none in our system since 2015.

## 2015-02-21 NOTE — Assessment & Plan Note (Signed)
Rx for voltaren gel, no oral NSAIDs given his dual antiplatelet. Also advised him to try heating pads. If no resolution in several weeks can see sports medicine for possible trigger injection.

## 2015-02-21 NOTE — Progress Notes (Signed)
   Subjective:    Patient ID: [name] Brown, male    DOB: [birth year], 64 y.o.   MRN: [id number]  HPI The patient is a 64 YO man who is coming in for muscle aches in his back. He does not know why it started but noticed it about 4 weeks ago. It hurts most of the day and worse with activity. He is taking tylenol for it and takes up to 8 per day on a bad day. On a good day he does not need as many. He is also having some wandering myalgias and wonders why. He had a bad case of statin myopathy several years ago. Drinks plenty of fluids. Denies other new complaints. Please see A/P for status and treatment of chronic medical problems.   PMH, [facility], social history reviewed and updated with the patient.   Review of Systems  Constitutional: Negative for fever, activity change, appetite change, fatigue and unexpected weight change.  HENT: Negative.   Eyes: Negative.   Respiratory: Negative for cough, chest tightness, shortness of breath and wheezing.   Cardiovascular: Negative for chest pain, palpitations and leg swelling.  Gastrointestinal: Negative for nausea, abdominal pain, diarrhea, constipation and abdominal distention.  Musculoskeletal: Positive for myalgias, back pain and arthralgias. Negative for gait problem and neck pain.  Skin: Negative.   Neurological: Negative.   Psychiatric/Behavioral: Negative.       Objective:   Physical Exam  Constitutional: He is oriented to person, place, and time. He appears well-developed and well-nourished.  HENT:  Head: Normocephalic and atraumatic.  Eyes: EOM are normal.  Neck: Normal range of motion.  Cardiovascular: Normal rate and regular rhythm.   Pulmonary/Chest: Effort normal and breath sounds normal. No respiratory distress. He has no wheezes. He has no rales.  Abdominal: Soft. He exhibits no distension and no mass. There is no tenderness. There is no rebound.  Musculoskeletal: He exhibits tenderness. He exhibits no edema.  Focal tenderness over  the left scapula, no tenderness over the spine.   Neurological: He is alert and oriented to person, place, and time. Coordination normal.  Skin: Skin is warm and dry.  Psychiatric: He has a normal mood and affect.   Filed Vitals:   02/21/15 1103  BP: 128/80  Pulse: 52  Temp: 97.8 F (36.6 C)  TempSrc: Oral  Resp: 14  Height: 5\' 10"  (1.778 m)  Weight: 180 lb (81.647 kg)  SpO2: 98%      Assessment & Plan:

## 2015-02-21 NOTE — Progress Notes (Signed)
Pre visit review using our clinic review tool, if applicable. No additional management support is needed unless otherwise documented below in the visit note. 

## 2015-02-21 NOTE — Patient Instructions (Signed)
We have sent in the cream for pain which is called voltaren gel. You can apply it where you have the pain in your back (or the other places you have pain) up to 3 times per day. It helps with the inflammation and should help heal the muscles in the back.  If you are not feeling better in 1-2 weeks call the office and ask for a visit with the sports medicine doctor Dr. Tamala Julian and he can do a trigger point injection in the back where you are having the pain.  We are checking some blood work today and will call you back about the results to see if we can find a cause for the pains.  Come back in about 6-12 months to check in or call us sooner if you are having new problems or questions.

## 2015-02-27 ENCOUNTER — Telehealth: Payer: Self-pay | Admitting: Internal Medicine

## 2015-02-27 NOTE — Telephone Encounter (Signed)
Spoke with patient about lab results.

## 2015-02-27 NOTE — Telephone Encounter (Signed)
Please call patient. He returned your call °

## 2015-03-07 ENCOUNTER — Other Ambulatory Visit: Payer: Self-pay | Admitting: Cardiovascular Disease

## 2015-03-14 ENCOUNTER — Telehealth: Payer: Self-pay | Admitting: Internal Medicine

## 2015-03-14 NOTE — Telephone Encounter (Signed)
Rec'd from [place] @ [place] forward 35 pages to Dr. Doug Sou

## 2015-04-01 ENCOUNTER — Other Ambulatory Visit: Payer: Self-pay | Admitting: Neurology

## 2015-04-08 ENCOUNTER — Other Ambulatory Visit: Payer: Self-pay | Admitting: Neurology

## 2015-04-14 ENCOUNTER — Other Ambulatory Visit: Payer: Self-pay | Admitting: Cardiovascular Disease

## 2015-04-16 ENCOUNTER — Encounter: Payer: Self-pay | Admitting: Geriatric Medicine

## 2015-04-19 ENCOUNTER — Other Ambulatory Visit: Payer: Self-pay | Admitting: Cardiovascular Disease

## 2015-04-25 NOTE — Progress Notes (Signed)
Patient ID: [name] Brown, male   DOB: [birth year], 64 y.o.   MRN: [id number] The pt is a 64 y.o.  male w/ PMHx significant for CAD s/p CABG x 3 ( LIMA to LAD, RIMA to PDA, L radial to OM1). He is s/p stent in 2008, s/p EVAR 2009 for AAA,  Presented to [facility] 1/15 with chest pain. Cath by Dr Martinique showed stable disease for medical Rx   Cath 10/25/13 [place] Films reviewed  Coronary angiography:  Coronary dominance: right  Left mainstem: Heavily calcified and tortuous. 40% distal stenosis.  Left anterior descending (LAD): Very complex proximal disease up to 95%. Heavy calcification. The LAD is occluded after the first diagonal and first septal perforator. The first diagonal is a moderate sized vessel with 40% disease.  Left circumflex (LCx): The left circumflex is calcified. There is 30-40% disease in the mid vessel. The first OM appears small and is occluded. The second OM is tiny. The terminal OM has segmental 50-60% disease.  Right coronary artery (RCA): 100% proximal occlusion.  The free radial graft to the OM is 100% occluded at the ostium.  The RIMA graft to the RCA is a large graft and widely patent.  The LIMA graft to the LAD is a large graft and is widely patent. It gives collaterals to an RV marginal branch.  Left ventriculography: Left ventricular systolic function is normal, LVEF is estimated at 50-55%, There is hypokinesis of the mid anterior wall, there is no significant mitral regurgitation  Final Conclusions:  1. Severe 3 vessel occlusive CAD  2. Patent LIMA to the LAD  3. Patent RIMA to the RCA  4. Occluded free radial graft to the OM  5. Good LV function.  Recommendations: Medical management.   Intermitant SSCP less on ranexa. 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 Seen by [place] neuro and started on depakote for cluster headaches    BP better controlled with hydralazine added   01/30/15 Duplex reviewed Left ICA 60-79% stenosis.  No TIA symptoms.  Continue antiplatelet Rx and F/U  carotid duplex in October   11/2014  EVAR [place] per Dr Scot Dock 2.8 cm sack size stable   ROS: Denies fever, malais, weight loss, blurry vision, decreased visual acuity, cough, sputum, SOB, hemoptysis, pleuritic pain, palpitaitons, heartburn, abdominal pain, melena, lower extremity edema, claudication, or rash.  All other systems reviewed and negative  General: Affect appropriate Healthy:  appears stated age 33: normal poor dentition  Neck supple with no adenopathy JVP normal bilateral bruits no thyromegaly Lungs clear with no wheezing and good diaphragmatic motion Heart:  S1/S2 SEM murmur, no rub, gallop or click PMI normal Abdomen: benighn, BS positve, no tenderness, no AAA no bruit.  No HSM or HJR Distal pulses intact with no bruits left radial harvested  No edema Neuro non-focal Skin warm and dry No muscular weakness   Current Outpatient Prescriptions  Medication Sig Dispense Refill  . aspirin EC 81 MG EC tablet Take 1 tablet (81 mg total) by mouth daily.    . clopidogrel (PLAVIX) 75 MG tablet Take 1 tablet (75 mg total) by mouth daily with breakfast. 90 tablet 3  . diclofenac sodium (VOLTAREN) 1 % GEL Apply 2 g topically 4 (four) times daily. 100 g 3  . divalproex (DEPAKOTE ER) 500 MG 24 hr tablet TAKE 1 TABLET BY MOUTH EVERY DAY 30 tablet 3  . divalproex (DEPAKOTE ER) 500 MG 24 hr tablet TAKE 1 TABLET BY MOUTH EVERY DAY 30 tablet  3  . doxycycline (VIBRA-TABS) 100 MG tablet Take 25 mg by mouth 2 (two) times daily. Take 25mg  twice daily    . Efinaconazole 10 % SOLN Apply 1 drop topically daily. 8 mL 2  . ezetimibe (ZETIA) 10 MG tablet Take 1 tablet by mouth  daily 90 tablet 3  . fish oil-omega-3 fatty acids 1000 MG capsule Take 1,200 mg by mouth 2 (two) times daily.     . Glucosamine-Chondroitin (GLUCOSAMINE CHONDR COMPLEX PO) Take 500 mg by mouth 2 (two) times daily.     . hydrALAZINE (APRESOLINE) 50 MG tablet Take 1 tablet (50 mg total) by mouth 2 (two) times daily. 180 tablet  3  . hydrochlorothiazide (MICROZIDE) 12.5 MG capsule TAKE 1 CAPSULE BY MOUTH DAILY 90 capsule 2  . isosorbide mononitrate (IMDUR) 30 MG 24 hr tablet TAKE 1 TABLET BY MOUTH EVERY DAY 30 tablet 0  . niacin (NIASPAN) 1000 MG CR tablet Take 1,000 mg by mouth 2 (two) times daily.      Marland Kitchen NITROSTAT 0.4 MG SL tablet PLACE 1 TABLET UNDER TONGUE EVERY 5 MINUTES FOR 3 DOSES AS NEEDED FOR CHEST PAIN 25 tablet 2  . Prenatal Vit-Fe Fumarate-FA (M-VIT) tablet Take 1 tablet by mouth daily.    . ramipril (ALTACE) 10 MG capsule TAKE 1 CAPSULE BY MOUTH TWICE DAILY 60 capsule 0  . ranolazine (RANEXA) 500 MG 12 hr tablet Take 500 mg by mouth daily.    . potassium chloride SA (K-DUR,KLOR-CON) 20 MEQ tablet Take 20 mEq by mouth daily.    . [DISCONTINUED] metoprolol (LOPRESSOR) 50 MG tablet Take 1 tablet (50 mg total) by mouth daily. 1/2 tab po once daily     No current facility-administered medications for this visit.    Allergies  Statins  Electrocardiogram:  07/17/14  SR possible old IMI no change from 2014   10/15/14 SR rate 59 PR 224  LAFB   Assessment and Plan CAD: Stable with no angina and good activity level.  Continue medical Rx Will try to stop ranexa and see if he tolerates  AAA:  Post EVAR f/u Scot Dock residual lumen 2.8 cm  Bruit: Left ICA 60-79% stenosis.  No TIA symptoms.  Continue antiplatelet Rx and F/U carotid duplex in Octomber   HTN: Well controlled.  Continue current medications and low sodium Dash type diet.    Chol:   Cholesterol is at goal.  Continue current dose of statin and diet Rx.  No myalgias or side effects.  F/U  LFT's in 6 months. Lab Results  Component Value Date   LDLCALC 131* 09/06/2012  Labs with primary            Stop Ranexa F/U with me 6 months

## 2015-04-28 ENCOUNTER — Ambulatory Visit (INDEPENDENT_AMBULATORY_CARE_PROVIDER_SITE_OTHER): Payer: PRIVATE HEALTH INSURANCE | Admitting: Cardiovascular Disease

## 2015-04-28 ENCOUNTER — Encounter: Payer: Self-pay | Admitting: Cardiovascular Disease

## 2015-04-28 VITALS — BP 102/54 | HR 72 | Ht 71.0 in | Wt 182.2 lb

## 2015-04-28 DIAGNOSIS — I2510 Atherosclerotic heart disease of native coronary artery without angina pectoris: Secondary | ICD-10-CM

## 2015-04-28 DIAGNOSIS — I2583 Coronary atherosclerosis due to lipid rich plaque: Principal | ICD-10-CM

## 2015-04-28 NOTE — Patient Instructions (Signed)
Medication Instructions:  DECREASE  RANEXA TO  500 MG  EVERY OTHER   DAY  FOR   FEW  WEEKS THEN  STOP  Labwork: NONE   Testing/Procedures: NONE  Follow-Up: Your physician wants you to follow-up in: [place] will receive a reminder letter in the mail two months in advance. If you don't receive a letter, please call our office to schedule the follow-up appointment.   Any Other Special Instructions Will Be Listed Below (If Applicable).

## 2015-05-05 ENCOUNTER — Other Ambulatory Visit: Payer: Self-pay | Admitting: Podiatrist

## 2015-05-17 ENCOUNTER — Other Ambulatory Visit: Payer: Self-pay | Admitting: Cardiovascular Disease

## 2015-05-21 ENCOUNTER — Other Ambulatory Visit: Payer: Self-pay | Admitting: Cardiovascular Disease

## 2015-05-29 ENCOUNTER — Encounter: Payer: Self-pay | Admitting: Family Medicine

## 2015-05-29 ENCOUNTER — Ambulatory Visit (INDEPENDENT_AMBULATORY_CARE_PROVIDER_SITE_OTHER): Payer: PRIVATE HEALTH INSURANCE | Admitting: Family Medicine

## 2015-05-29 VITALS — BP 126/78 | HR 74 | Ht 71.0 in | Wt 184.0 lb

## 2015-05-29 DIAGNOSIS — M9908 Segmental and somatic dysfunction of rib cage: Secondary | ICD-10-CM

## 2015-05-29 DIAGNOSIS — M9902 Segmental and somatic dysfunction of thoracic region: Secondary | ICD-10-CM | POA: Diagnosis not present

## 2015-05-29 DIAGNOSIS — M9901 Segmental and somatic dysfunction of cervical region: Secondary | ICD-10-CM

## 2015-05-29 DIAGNOSIS — M6248 Contracture of muscle, other site: Secondary | ICD-10-CM

## 2015-05-29 DIAGNOSIS — M999 Biomechanical lesion, unspecified: Secondary | ICD-10-CM | POA: Insufficient documentation

## 2015-05-29 DIAGNOSIS — M62838 Other muscle spasm: Secondary | ICD-10-CM | POA: Insufficient documentation

## 2015-05-29 NOTE — Progress Notes (Signed)
Pre visit review using our clinic review tool, if applicable. No additional management support is needed unless otherwise documented below in the visit note. 

## 2015-05-29 NOTE — Assessment & Plan Note (Signed)
Patient had an muscle spasm of the left trapezius. Patient was given different choices and patient did respond very well to osteopathic manipulation. Patient work with Product/process development scientist today to learn home exercises in greater detail. We discussed postural changes and what activities to avoid. Patient will come back again in 3 weeks for further evaluation. If patient continues to have some difficulty could be a candidate for trigger point injections.

## 2015-05-29 NOTE — Progress Notes (Signed)
[facility] [place], [place] [address] Phone: [phone] Subjective:    I'm seeing this patient by the request  of:  Olga Millers, MD   CC: Upper back pain  [id number] WHALEN TROMPETER is a 64 y.o. male coming in with complaint of upper back pain. Seems to be left-sided.: In 3 weeks. Patient states that it is a dull throbbing aching sensation in the left shoulder region. Only hurts with activity. Can give him a discomfort at night. Denies any radiation down the arm. States though that is starting to affect his mood. Does not remember any specific injury. Tries to swim for exercise and has not been able to do so secondary to the pain. Has tried some topical gel with no significant improvement.     Past medical history, social, surgical and family history all reviewed in electronic medical record.   Review of Systems: No headache, visual changes, nausea, vomiting, diarrhea, constipation, dizziness, abdominal pain, skin rash, fevers, chills, night sweats, weight loss, swollen lymph nodes, body aches, joint swelling, muscle aches, chest pain, shortness of breath, mood changes.   Objective Blood pressure 126/78, pulse 74, height 5\' 11"  (1.803 m), weight 184 lb (83.462 kg), SpO2 96 %.  General: No apparent distress alert and oriented x3 mood and affect normal, dressed appropriately.  HEENT: Pupils equal, extraocular movements intact  Respiratory: Patient's speak in full sentences and does not appear short of breath  Cardiovascular: No lower extremity edema, non tender, no erythema  Skin: Warm dry intact with no signs of infection or rash on extremities or on axial skeleton.  Abdomen: Soft nontender  Neuro: Cranial nerves II through XII are intact, neurovascularly intact in all extremities with 2+ DTRs and 2+ pulses.  Lymph: No lymphadenopathy of posterior or anterior cervical chain or axillae bilaterally.  Gait normal with good balance and  coordination.  MSK:  Non tender with full range of motion and good stability and symmetric strength and tone of  elbows, wrist, hip, knee and ankles bilaterally.  Neck: Inspection unremarkable. Mild increasing kyphosis of the upper spine No palpable stepoffs. Negative Spurling's maneuver. Limitation with left-sided rotation Grip strength and sensation normal in bilateral hands Strength good C4 to T1 distribution No sensory change to C4 to T1 Negative Hoffman sign bilaterally Reflexes normal Shoulder: Left Inspection reveals no abnormalities, atrophy or asymmetry. Palpation is normal with no tenderness over AC joint or bicipital groove. ROM is full in all planes. Rotator cuff strength normal throughout. No signs of impingement with negative Neer and Hawkin's tests, empty can sign. Speeds and Yergason's tests normal. No labral pathology noted with negative Obrien's, negative clunk and good stability. Severe tightness of the trapezius muscle with trigger point's noted in the left area. No painful arc and no drop arm sign. No apprehension sign  Osteopathic manipulation findings Patient hasn't inhaled third rib with T3 extended rotated and side bent left C7 flexed rotated and side bent right  Procedure note 97110; 15 minutes spent for Therapeutic exercises as stated in above notes.  This included exercises focusing on stretching, strengthening, with significant focus on eccentric aspects. Scapular strengthening exercises and postural control exercises given focusing on latissimus dorsi as well as rhomboids.  Proper technique shown and discussed handout in great detail with ATC.  All questions were discussed and answered.     Impression and Recommendations:     This case required medical decision making of moderate complexity.

## 2015-05-29 NOTE — Assessment & Plan Note (Signed)
Decision today to treat with OMT was based on Physical Exam  After verbal consent patient was treated with HVLA, ME, FPR techniques in rib, thoracic and cervical areas  Patient tolerated the procedure well with improvement in symptoms  Patient given exercises, stretches and lifestyle modifications  See medications in patient instructions if given  Patient will follow up in 3 weeks

## 2015-05-29 NOTE — Patient Instructions (Signed)
Nice to meet you Ice the area 2x/day - activity and before bed  Rehab exercises 3x/week Try to sleep with elbow below shoulder if you can Take the Duexis for 3days, 1pill 3x/day  Come back in 3-4weeks

## 2015-06-10 ENCOUNTER — Other Ambulatory Visit: Payer: Self-pay | Admitting: Cardiovascular Disease

## 2015-06-24 ENCOUNTER — Ambulatory Visit: Payer: PRIVATE HEALTH INSURANCE | Admitting: Internal Medicine

## 2015-07-10 ENCOUNTER — Ambulatory Visit (INDEPENDENT_AMBULATORY_CARE_PROVIDER_SITE_OTHER): Payer: PRIVATE HEALTH INSURANCE | Admitting: Family Medicine

## 2015-07-10 ENCOUNTER — Encounter: Payer: Self-pay | Admitting: Family Medicine

## 2015-07-10 VITALS — BP 138/84 | HR 65 | Ht 71.0 in | Wt 185.0 lb

## 2015-07-10 DIAGNOSIS — M9902 Segmental and somatic dysfunction of thoracic region: Secondary | ICD-10-CM | POA: Diagnosis not present

## 2015-07-10 DIAGNOSIS — M999 Biomechanical lesion, unspecified: Secondary | ICD-10-CM

## 2015-07-10 DIAGNOSIS — M791 Myalgia, unspecified site: Secondary | ICD-10-CM

## 2015-07-10 DIAGNOSIS — M62838 Other muscle spasm: Secondary | ICD-10-CM

## 2015-07-10 DIAGNOSIS — M6248 Contracture of muscle, other site: Secondary | ICD-10-CM

## 2015-07-10 DIAGNOSIS — D509 Iron deficiency anemia, unspecified: Secondary | ICD-10-CM | POA: Diagnosis not present

## 2015-07-10 DIAGNOSIS — M9901 Segmental and somatic dysfunction of cervical region: Secondary | ICD-10-CM

## 2015-07-10 DIAGNOSIS — M9908 Segmental and somatic dysfunction of rib cage: Secondary | ICD-10-CM | POA: Diagnosis not present

## 2015-07-10 NOTE — Patient Instructions (Addendum)
Good to see you Continue the exercises Iron 65 mg elemental iron daily with 500mg  of vitamin C would help a lot Continue the back exercises 2-3 times a week like clockwork.  On wall with heels, butt shoulder  And head touching goal of 5 minutes daily New exercises for the hip a little bit See me again in 3-4 weeks.

## 2015-07-10 NOTE — Progress Notes (Signed)
Pre visit review using our clinic review tool, if applicable. No additional management support is needed unless otherwise documented below in the visit note. 

## 2015-07-10 NOTE — Progress Notes (Signed)
[name] [place], [place] [address] Phone: [phone] Subjective:     CC: Upper back pain follow up  [name] Brown is a 64 y.o. male coming in with complaint of upper back pain. 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 Patient did have manipulation of the rib. Patient was to do home exercises and more of scapular exercises to try to keep rib in place. Patient states his upper back was doing significantly better after the manipulation has been trying to do the home exercises intermittently. Over the course last several days he is started having increasing pain in the area again. Still not as bad as it would've is was previously. Patient has not notice any other associations that seem to make it worse. Describes it as more of a dull aching pain.  Patient is also complaining of some mild right hip pain. Happens after doing a lot of activity. Denies any radiation down the leg and denies any back pain and seems to be associated with it. She rates the severity of pain is 3 out of 10. Patient is wondering if this could be more secondary to his back first possible injury he needs to worry about. Sometimes have cramping sensation in his leg as well as the upper extremities off and on.     Past medical history, social, surgical and family history all reviewed in electronic medical record.   Review of Systems: No headache, visual changes, nausea, vomiting, diarrhea, constipation, dizziness, abdominal pain, skin rash, fevers, chills, night sweats, weight loss, swollen lymph nodes, body aches, joint swelling, muscle aches, chest pain, shortness of breath, mood changes.   Objective Blood pressure 138/84, pulse 65, height 5\' 11"  (1.803 m), weight 185 lb (83.915 kg), SpO2 98 %.  General: No apparent distress alert and oriented x3 mood and affect normal, dressed appropriately.  HEENT: Pupils  equal, extraocular movements intact  Respiratory: Patient's speak in full sentences and does not appear short of breath  Cardiovascular: No lower extremity edema, non tender, no erythema  Skin: Warm dry intact with no signs of infection or rash on extremities or on axial skeleton.  Abdomen: Soft nontender  Neuro: Cranial nerves II through XII are intact, neurovascularly intact in all extremities with 2+ DTRs and 2+ pulses.  Lymph: No lymphadenopathy of posterior or anterior cervical chain or axillae bilaterally.  Gait normal with good balance and coordination.  MSK:  Non tender with full range of motion and good stability and symmetric strength and tone of  elbows, wrist, hip, knee and ankles bilaterally.  Neck: Inspection unremarkable. Mild increasing kyphosis of the upper spine No palpable stepoffs. Negative Spurling's maneuver. Limitation with left-sided rotation Grip strength and sensation normal in bilateral hands Strength good C4 to T1 distribution No sensory change to C4 to T1 Negative Hoffman sign bilaterally Reflexes normal Shoulder: Left Inspection reveals no abnormalities, atrophy or asymmetry. Palpation is normal with no tenderness over AC joint or bicipital groove. ROM is full in all planes. Rotator cuff strength normal throughout. No signs of impingement with negative Neer and Hawkin's tests, empty can sign. Speeds and Yergason's tests normal. No labral pathology noted with negative Obrien's, negative clunk and good stability. Normal scapular function observed. Mild tightness of the trapezius but significantly better than previous exam No painful arc and no drop arm sign. No apprehension sign     Osteopathic manipulation findings Patient hasn't  inhaled third rib with T3 extended rotated and side bent left C7 flexed rotated and side bent right L2 flexed rotated and side bent right     Impression and Recommendations:     This case required medical decision  making of moderate complexity.

## 2015-07-11 DIAGNOSIS — M791 Myalgia, unspecified site: Secondary | ICD-10-CM | POA: Insufficient documentation

## 2015-07-11 DIAGNOSIS — D509 Iron deficiency anemia, unspecified: Secondary | ICD-10-CM | POA: Insufficient documentation

## 2015-07-11 NOTE — Assessment & Plan Note (Signed)
Patient is having more of a spasm again. Responded very well again to osteopathic manipulation. Encourage scapular strengthening exercises and doing this on a more regular basis. Patient declined formal physical therapy. We discussed icing regimen. Patient try the topical anti-inflammatory's again. We'll avoid oral anti-inflammatory secondary to patient's anticoagulation. Patient will follow-up again in 3-4 weeks for further evaluation and treatment.

## 2015-07-11 NOTE — Assessment & Plan Note (Signed)
Decision today to treat with OMT was based on Physical Exam  After verbal consent patient was treated with HVLA, ME, FPR techniques in rib, thoracic and cervical areas  Patient tolerated the procedure well with improvement in symptoms  Patient given exercises, stretches and lifestyle modifications  See medications in patient instructions if given  Patient will follow up in 3-4 weeks

## 2015-07-21 ENCOUNTER — Other Ambulatory Visit: Payer: Self-pay | Admitting: Cardiovascular Disease

## 2015-07-31 ENCOUNTER — Ambulatory Visit: Payer: PRIVATE HEALTH INSURANCE | Admitting: Family Medicine

## 2015-08-07 ENCOUNTER — Other Ambulatory Visit: Payer: Self-pay | Admitting: Cardiovascular Disease

## 2015-08-08 ENCOUNTER — Other Ambulatory Visit: Payer: Self-pay | Admitting: Cardiovascular Disease

## 2015-08-08 DIAGNOSIS — I6523 Occlusion and stenosis of bilateral carotid arteries: Secondary | ICD-10-CM

## 2015-08-13 ENCOUNTER — Ambulatory Visit (HOSPITAL_COMMUNITY)
Admission: RE | Admit: 2015-08-13 | Discharge: 2015-08-13 | Disposition: A | Discharge status: Home / Self Care | Payer: PRIVATE HEALTH INSURANCE | Source: Ambulatory Visit | Attending: Internal Medicine | Admitting: Internal Medicine

## 2015-08-13 DIAGNOSIS — I6523 Occlusion and stenosis of bilateral carotid arteries: Secondary | ICD-10-CM

## 2015-08-13 DIAGNOSIS — E785 Hyperlipidemia, unspecified: Secondary | ICD-10-CM | POA: Insufficient documentation

## 2015-08-13 DIAGNOSIS — I10 Essential (primary) hypertension: Secondary | ICD-10-CM | POA: Diagnosis not present

## 2015-08-19 ENCOUNTER — Encounter: Payer: Self-pay | Admitting: Family Medicine

## 2015-08-19 ENCOUNTER — Other Ambulatory Visit: Payer: Self-pay | Admitting: Cardiovascular Disease

## 2015-08-19 ENCOUNTER — Ambulatory Visit (INDEPENDENT_AMBULATORY_CARE_PROVIDER_SITE_OTHER): Payer: PRIVATE HEALTH INSURANCE | Admitting: Family Medicine

## 2015-08-19 VITALS — BP 140/78 | HR 54 | Ht 71.0 in | Wt 185.0 lb

## 2015-08-19 DIAGNOSIS — M542 Cervicalgia: Secondary | ICD-10-CM | POA: Diagnosis not present

## 2015-08-19 DIAGNOSIS — M9901 Segmental and somatic dysfunction of cervical region: Secondary | ICD-10-CM | POA: Diagnosis not present

## 2015-08-19 DIAGNOSIS — M9908 Segmental and somatic dysfunction of rib cage: Secondary | ICD-10-CM | POA: Diagnosis not present

## 2015-08-19 DIAGNOSIS — M9902 Segmental and somatic dysfunction of thoracic region: Secondary | ICD-10-CM

## 2015-08-19 DIAGNOSIS — M999 Biomechanical lesion, unspecified: Secondary | ICD-10-CM

## 2015-08-19 MED ORDER — TIZANIDINE HCL 4 MG PO TABS: 4.0000 mg | ORAL_TABLET | Freq: Every evening | ORAL | Status: DC

## 2015-08-19 NOTE — Assessment & Plan Note (Signed)
No masses palpated. Likely secondary to irritation in the area. Once relaxer given. Warned of potential side effects. We discussed range of motion exercises and strengthening of the upper back again. We discussed ergonomics are up-to-date. Patient can make these changes and come back and see me again in 2 weeks if not completely healed. Patient may need prednisone if this severe spasm continues.

## 2015-08-19 NOTE — Assessment & Plan Note (Signed)
Decision today to treat with OMT was based on Physical Exam  After verbal consent patient was treated with HVLA, ME, FPR techniques in rib, thoracic and cervical areas  Patient tolerated the procedure well with improvement in symptoms  Patient given exercises, stretches and lifestyle modifications  See medications in patient instructions if given  Patient will follow up in 3-4 weeks

## 2015-08-19 NOTE — Progress Notes (Signed)
[facility] [address] [place], [place] [address] Phone: [phone] Subjective:     CC: Upper back pain follow up  [id number] [name] Brown is a 64 y.o. male coming in with complaint of upper back pain. 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 Patient was seen 6 weeks ago for manipulation and did respond very well. Patient states overall his upper back is been doing significantly better. Patient though has had increasing and neck pain. Did have a carotid ultrasound and feels that they were somewhat aggressive. States that his been more of a spasm for the last 2 days. Has difficult he with range of motion of the neck as well as sleeping at night. Denies any significant headache but does have chronic migraines.  Patient is also complaining of some mild right hip pain. Patient was having some difficulty with more of a right hip pain that seemed to be secondary to possible malalignment. Patient's differential also includes more of a greater trochanter bursitis. Patient states since he's been taking the iron the pain is completely resolved.     Past medical history, social, surgical and family history all reviewed in electronic medical record.   Review of Systems: No headache, visual changes, nausea, vomiting, diarrhea, constipation, dizziness, abdominal pain, skin rash, fevers, chills, night sweats, weight loss, swollen lymph nodes, body aches, joint swelling, muscle aches, chest pain, shortness of breath, mood changes.   Objective Blood pressure 140/78, pulse 54, height 5\' 11"  (1.803 m), weight 185 lb (83.915 kg), SpO2 99 %.  General: No apparent distress alert and oriented x3 mood and affect normal, dressed appropriately.  HEENT: Pupils equal, extraocular movements intact  Respiratory: Patient's speak in full sentences and does not appear short of breath  Cardiovascular: No lower extremity edema,  non tender, no erythema  Skin: Warm dry intact with no signs of infection or rash on extremities or on axial skeleton.  Abdomen: Soft nontender  Neuro: Cranial nerves II through XII are intact, neurovascularly intact in all extremities with 2+ DTRs and 2+ pulses.  Lymph: No lymphadenopathy of posterior or anterior cervical chain or axillae bilaterally.  Gait normal with good balance and coordination.  MSK:  Non tender with full range of motion and good stability and symmetric strength and tone of  elbows, wrist, hip, knee and ankles bilaterally.  Neck: Inspection unremarkable. Mild increasing kyphosis of the upper spine No palpable stepoffs. Negative Spurling's maneuver. Limitation with left-sided rotation patient is tender to palpation on the right side with significant tightness of the [facility] by musculature of the cervical spine no spinous process tenderness. Grip strength and sensation normal in bilateral hands Strength good C4 to T1 distribution No sensory change to C4 to T1 Negative Hoffman sign bilaterally Reflexes normal Shoulder: Left Inspection reveals no abnormalities, atrophy or asymmetry. Palpation is normal with no tenderness over AC joint or bicipital groove. ROM is full in all planes. Rotator cuff strength normal throughout. No signs of impingement with negative Neer and Hawkin's tests, empty can sign. Speeds and Yergason's tests normal. No labral pathology noted with negative Obrien's, negative clunk and good stability. Normal scapular function observed. Mild tightness of the trapezius but significantly better than previous exam No painful arc and no drop arm sign. No apprehension sign     Osteopathic manipulation findings C2 flexed rotated and side bent right T2 extended rotated and side bent left C7 flexed rotated and  side bent right L2 flexed rotated and side bent right     Impression and Recommendations:     This case required medical decision making of  moderate complexity.

## 2015-08-19 NOTE — Patient Instructions (Addendum)
Great to see you Ice can help 2 tennis ball in a tube sock and lay on them where neck meets head.  Continue your other exercises Zanaflex at night for next 3 nights then as needed See me again in 2 weeks

## 2015-08-19 NOTE — Progress Notes (Signed)
Pre visit review using our clinic review tool, if applicable. No additional management support is needed unless otherwise documented below in the visit note. 

## 2015-09-03 ENCOUNTER — Ambulatory Visit: Payer: PRIVATE HEALTH INSURANCE | Admitting: Family Medicine

## 2015-10-05 ENCOUNTER — Encounter (HOSPITAL_COMMUNITY): Payer: Self-pay | Admitting: Emergency Medicine

## 2015-10-05 DIAGNOSIS — R0602 Shortness of breath: Secondary | ICD-10-CM | POA: Diagnosis not present

## 2015-10-05 DIAGNOSIS — Z79899 Other long term (current) drug therapy: Secondary | ICD-10-CM | POA: Diagnosis not present

## 2015-10-05 DIAGNOSIS — Z87891 Personal history of nicotine dependence: Secondary | ICD-10-CM | POA: Insufficient documentation

## 2015-10-05 DIAGNOSIS — I2510 Atherosclerotic heart disease of native coronary artery without angina pectoris: Secondary | ICD-10-CM | POA: Diagnosis not present

## 2015-10-05 DIAGNOSIS — I252 Old myocardial infarction: Secondary | ICD-10-CM | POA: Diagnosis not present

## 2015-10-05 DIAGNOSIS — E785 Hyperlipidemia, unspecified: Secondary | ICD-10-CM | POA: Insufficient documentation

## 2015-10-05 DIAGNOSIS — Z7982 Long term (current) use of aspirin: Secondary | ICD-10-CM | POA: Insufficient documentation

## 2015-10-05 DIAGNOSIS — I10 Essential (primary) hypertension: Secondary | ICD-10-CM | POA: Insufficient documentation

## 2015-10-05 DIAGNOSIS — Z792 Long term (current) use of antibiotics: Secondary | ICD-10-CM | POA: Diagnosis not present

## 2015-10-05 NOTE — ED Notes (Signed)
C/o sob since lying down to go to bed at 10:30pm.  Denies chest pain.  Denies swelling to lower extremities.

## 2015-10-06 ENCOUNTER — Emergency Department (HOSPITAL_COMMUNITY): Payer: Managed Care, Other (non HMO)

## 2015-10-06 ENCOUNTER — Emergency Department (HOSPITAL_COMMUNITY)
Admission: EM | Admit: 2015-10-06 | Discharge: 2015-10-06 | Disposition: A | Discharge status: Home / Self Care | Payer: Managed Care, Other (non HMO) | Attending: Emergency Medicine | Admitting: Emergency Medicine

## 2015-10-06 DIAGNOSIS — R0602 Shortness of breath: Secondary | ICD-10-CM

## 2015-10-06 LAB — BASIC METABOLIC PANEL
Anion gap: 10 (ref 5–15)
BUN: 22 mg/dL — ABNORMAL HIGH (ref 6–20)
CO2: 24 mmol/L (ref 22–32)
Calcium: 9.5 mg/dL (ref 8.9–10.3)
Chloride: 103 mmol/L (ref 101–111)
Creatinine, Ser: 1.05 mg/dL (ref 0.61–1.24)
GFR calc Af Amer: >60 mL/min (ref >60)
GFR calc non Af Amer: >60 mL/min (ref >60)
Glucose, Bld: 125 mg/dL — ABNORMAL HIGH (ref 65–99)
Potassium: 4.3 mmol/L (ref 3.5–5.1)
Sodium: 137 mmol/L (ref 135–145)

## 2015-10-06 LAB — I-STAT TROPONIN, ED
TROPONIN I, POC: 0.01 ng/mL (ref 0.00–0.08)
Troponin i, poc: 0 ng/mL (ref 0.00–0.08)

## 2015-10-06 LAB — CBC
HCT: 39 % (ref 39.0–52.0)
HEMOGLOBIN: 13.2 g/dL (ref 13.0–17.0)
MCH: 32.7 pg (ref 26.0–34.0)
MCHC: 33.8 g/dL (ref 30.0–36.0)
MCV: 96.5 fL (ref 78.0–100.0)
Platelets: 201 10*3/uL (ref 150–400)
RBC: 4.04 MIL/uL — ABNORMAL LOW (ref 4.22–5.81)
RDW: 14.6 % (ref 11.5–15.5)
WBC: 8.8 10*3/uL (ref 4.0–10.5)

## 2015-10-06 LAB — BRAIN NATRIURETIC PEPTIDE: B Natriuretic Peptide: 88.9 pg/mL (ref 0.0–100.0)

## 2015-10-06 NOTE — Discharge Instructions (Signed)

## 2015-10-06 NOTE — ED Provider Notes (Signed)
CSN: [id number]     Arrival date & time 10/05/15  2354 History  By signing my name below, I, Sonum Patel, attest that this documentation has been prepared under the direction and in the presence of Merryl Hacker, MD. Electronically Signed: Sonum Patel, Education administrator. 10/06/2015. 1:52 AM.     Chief Complaint  Patient presents with  . Shortness of Breath    The history is provided by the patient. No language interpreter was used.     HPI Comments: [name] Brown is a 64 y.o. male with past medical history of CAD, HLD, HTN, MI who presents to the Emergency Department complaining of an episode of sudden onset, constant SOB that occurred 3 hours ago while lying in bed which has since resolved. He states the episode lasted 1 hour and was exacerbated by lying supine. He reports a history of MI and cardiac catheterization in 2015 and a triple CABG in 2007. He denies recent long distance travel or immobilizations. He denies cough, fever, CP, leg swelling.  Patient reports that he is at his baseline.  Cardiologist Johnsie Cancel   Past Medical History  Diagnosis Date  . Bradycardia   . Hyperlipidemia   . HTN (hypertension)   . CAD (coronary artery disease) CABG 03/2000    Median sternotomy for coronary artery bypass grafting x 3 (left  . Aortic aneurysm ([place]) Endograf 2009  . Myocardial infarction [facility])    Past Surgical History  Procedure Laterality Date  . Coronary artery bypass graft      AAA stent graft on 12/01/2007 at [facility]  coronary bypass in 2001. Dr. Roxy Manns.  . Left heart catheterization with coronary angiogram N/A 10/26/2013    Procedure: LEFT HEART CATHETERIZATION WITH CORONARY ANGIOGRAM;  Surgeon: Peter M Martinique, MD;  Location: [facility] CATH LAB;  Service: Cardiovascular;  Laterality: N/A;   Family History  Problem Relation Age of Onset  . Heart attack Mother   . Heart disease Mother   . Heart attack Father   . Heart disease Father    Social History  Substance Use  Topics  . Smoking status: Former Smoker -- 2.00 packs/day for 30 years    Types: Cigarettes    Quit date: 01/05/1992  . Smokeless tobacco: Never Used     Comment: quit 19 yrs ago  . Alcohol Use: No    Review of Systems  Constitutional: Negative for fever.  Respiratory: Positive for shortness of breath. Negative for cough.   Cardiovascular: Negative for chest pain and leg swelling.  Gastrointestinal: Negative for nausea, vomiting and abdominal pain.  All other systems reviewed and are negative.     Allergies  Statins  Home Medications   Prior to Admission medications   Medication Sig Start Date End Date Taking? Authorizing Provider  aspirin EC 81 MG EC tablet Take 1 tablet (81 mg total) by mouth daily. 10/29/13  Yes Erlene Quan, PA-C  cholecalciferol (VITAMIN D) 1000 UNITS tablet Take 1,000 Units by mouth daily.   Yes Historical Provider, MD  clopidogrel (PLAVIX) 75 MG tablet Take 1 tablet (75 mg total) by mouth daily with breakfast. 10/30/14  Yes Josue Hector, MD  diclofenac sodium (VOLTAREN) 1 % GEL Apply 2 g topically 4 (four) times daily. Patient taking differently: Apply 2 g topically 4 (four) times daily as needed (pain).  02/21/15  Yes Hoyt Koch, MD  divalproex (DEPAKOTE ER) 500 MG 24 hr tablet TAKE 1 TABLET BY MOUTH EVERY DAY 04/01/15  Yes Pieter Partridge, DO  doxycycline (VIBRA-TABS) 100 MG tablet Take 50 mg by mouth daily. Take 25mg  twice daily 12/12/13  Yes Historical Provider, MD  ferrous fumarate (HEMOCYTE - 106 MG FE) 325 (106 FE) MG TABS tablet Take 1 tablet by mouth daily.   Yes Historical Provider, MD  fish oil-omega-3 fatty acids 1000 MG capsule Take 1,200 mg by mouth 2 (two) times daily.    Yes Historical Provider, MD  Glucosamine-Chondroitin (GLUCOSAMINE CHONDR COMPLEX PO) Take 500 mg by mouth 2 (two) times daily.    Yes Historical Provider, MD  hydrALAZINE (APRESOLINE) 50 MG tablet TAKE 1 TABLET BY MOUTH TWICE DAILY Patient taking differently: TAKE 1  TABLET BY MOUTH DAILY 08/07/15  Yes Josue Hector, MD  hydrochlorothiazide (MICROZIDE) 12.5 MG capsule TAKE 1 CAPSULE BY MOUTH DAILY 08/20/15  Yes Josue Hector, MD  isosorbide mononitrate (IMDUR) 30 MG 24 hr tablet TAKE 1 TABLET BY MOUTH EVERY DAY 04/21/15  Yes Josue Hector, MD  JUBLIA 10 % SOLN GENTLY APPLY A THIN LAYER TO AFFECTED NAIL(S) ONCE DAILY AS DIRECTED 05/05/15  Yes Wallene Huh, DPM  niacin (NIASPAN) 1000 MG CR tablet Take 1,000 mg by mouth 2 (two) times daily.     Yes Historical Provider, MD  NITROSTAT 0.4 MG SL tablet PLACE 1 TABLET UNDER TONGUE EVERY 5 MINUTES FOR 3 DOSES AS NEEDED FOR CHEST PAIN 06/07/14  Yes Josue Hector, MD  potassium chloride SA (K-DUR,KLOR-CON) 20 MEQ tablet Take 20 mEq by mouth daily. 04/21/15  Yes Historical Provider, MD  Prenatal Vit-Fe Fumarate-FA (M-VIT) tablet Take 1 tablet by mouth daily.   Yes Historical Provider, MD  ramipril (ALTACE) 10 MG capsule TAKE 1 CAPSULE BY MOUTH TWICE DAILY 06/10/15  Yes Josue Hector, MD  tiZANidine (ZANAFLEX) 4 MG tablet Take 1 tablet (4 mg total) by mouth Nightly. Patient taking differently: Take 4 mg by mouth daily as needed for muscle spasms.  08/19/15  Yes Lyndal Pulley, DO  vitamin C (ASCORBIC ACID) 500 MG tablet Take 500 mg by mouth daily.   Yes Historical Provider, MD  ZETIA 10 MG tablet TAKE 1 TABLET BY MOUTH DAILY 07/21/15  Yes Josue Hector, MD   BP 155/85 mmHg  Pulse 44  Temp(Src) 97.3 F (36.3 C) (Oral)  Resp 15  SpO2 97% Physical Exam  Constitutional: He is oriented to person, place, and time. He appears well-developed and well-nourished. No distress.  HENT:  Head: Normocephalic and atraumatic.  Cardiovascular: Normal rate, regular rhythm and normal heart sounds.   Pulmonary/Chest: Effort normal and breath sounds normal. No respiratory distress. He has no wheezes. He has no rales.  Midline sternotomy scar  Abdominal: Soft. Bowel sounds are normal. There is no tenderness. There is no rebound.   Musculoskeletal: He exhibits no edema.  Neurological: He is alert and oriented to person, place, and time.  Skin: Skin is warm and dry.  Psychiatric: He has a normal mood and affect.  Nursing note and vitals reviewed.   ED Course  Procedures (including critical care time)  DIAGNOSTIC STUDIES: Oxygen Saturation is 96% on RA, adequate by my interpretation.    COORDINATION OF CARE: 1:57 AM Discussed treatment plan with pt at bedside and pt agreed to plan.   Labs Review Labs Reviewed  BASIC METABOLIC PANEL - Abnormal; Notable for the following:    Glucose, Bld 125 (*)    BUN 22 (*)    All other components within normal limits  CBC - Abnormal; Notable for the following:    RBC 4.04 (*)    All other components within normal limits  BRAIN NATRIURETIC PEPTIDE  I-STAT TROPOININ, ED  Randolm Idol, ED    Imaging Review Dg Chest 2 View  10/06/2015  CLINICAL DATA:  Acute onset of shortness of breath. Initial encounter. EXAM: CHEST  2 VIEW COMPARISON:  Chest radiograph performed 10/25/2013 FINDINGS: The lungs are well-aerated. Mild peribronchial thickening is noted. There is no evidence of focal opacification, pleural effusion or pneumothorax. The heart is normal in size; the patient is status post median sternotomy, with evidence of prior CABG. No acute osseous abnormalities are seen. An aortic stent graft is noted at the upper abdomen. IMPRESSION: Mild peribronchial thickening noted.  Lungs otherwise grossly clear. Electronically Signed   By: Garald Balding M.D.   On: 10/06/2015 00:34   I have personally reviewed and evaluated these images and lab results as part of my medical decision-making.   EKG Interpretation   Date/Time:  Sunday October 05 2015 23:54:55 EST Ventricular Rate:  48 PR Interval:  194 QRS Duration: 122 QT Interval:  456 QTC Calculation: 407 R Axis:   -32 Text Interpretation:  Sinus bradycardia Left axis deviation Non-specific  intra-ventricular conduction  delay Nonspecific ST abnormality Abnormal ECG  Confirmed by Blaklee Shores  MD, Honestee Revard ([id number]) on 10/06/2015 4:17:16 AM      MDM   Final diagnoses:  Shortness of breath    Patient presents with SOB.  Isolated and associated with lying flat.  CUrrently resolved.  No CP.  No infectious symptoms.  Non toxic on exam.  No signs of volume overload.  No hx of COPD.  No PE risk factors.  EKG nonischemic.  Xray nonspecific. Labwork including delta troponin reassuring.  He has been asymptomatic in the ED.  FOllow-up closely with cardiology. Given strict return precautions.    After history, exam, and medical workup I feel the patient has been appropriately medically screened and is safe for discharge home. Pertinent diagnoses were discussed with the patient. Patient was given return precautions.   I personally performed the services described in this documentation, which was scribed in my presence. The recorded information has been reviewed and is accurate.   Merryl Hacker, MD 10/08/15 [phone]

## 2015-10-06 NOTE — ED Notes (Signed)
Pt not c/o any SOB or pain on arrival to room.

## 2015-10-07 ENCOUNTER — Telehealth: Payer: Self-pay | Admitting: Cardiovascular Disease

## 2015-10-07 ENCOUNTER — Other Ambulatory Visit: Payer: Self-pay | Admitting: Cardiovascular Disease

## 2015-10-07 NOTE — Telephone Encounter (Signed)
New problem   Pt wanted the office to know that he went to the hospital 12.25.16 for sob. Just a FYI.

## 2015-10-07 NOTE — Telephone Encounter (Signed)
To Dr. Nishan 

## 2015-10-08 NOTE — Telephone Encounter (Signed)
BNP and CXR was normal can f/u with me next available

## 2015-10-08 NOTE — Telephone Encounter (Signed)
PT AWARE  HAS  APPT IN January INSTRUCTED PT  TO KEEP .[name] Brown

## 2015-10-08 NOTE — Telephone Encounter (Signed)
Follow up  ° ° ° °Returning call back to nurse  °

## 2015-10-08 NOTE — Telephone Encounter (Signed)
LM TO CALL BACK ./CY 

## 2015-10-22 ENCOUNTER — Other Ambulatory Visit: Payer: Self-pay | Admitting: Cardiovascular Disease

## 2015-10-31 ENCOUNTER — Encounter: Payer: Self-pay | Admitting: *Deleted

## 2015-11-01 NOTE — Progress Notes (Signed)
Patient ID: [name] Brown, male   DOB: 03-02-1951, 65 y.o.   MRN: [id number]   The pt is a 65 y.o.  male w/ PMHx significant for CAD s/p CABG x 3 ( LIMA to LAD, RIMA to PDA, L radial to OM1). He is s/p stent in 2008, s/p EVAR 2009 for AAA,  Presented to [facility]15 with chest pain. Cath by Dr Martinique showed stable disease for medical Rx   Cath 10/25/13 Martinique Films reviewed  Coronary angiography:  Coronary dominance: right  Left mainstem: Heavily calcified and tortuous. 40% distal stenosis.  Left anterior descending (LAD): Very complex proximal disease up to 95%. Heavy calcification. The LAD is occluded after the first diagonal and first septal perforator. The first diagonal is a moderate sized vessel with 40% disease.  Left circumflex (LCx): The left circumflex is calcified. There is 30-40% disease in the mid vessel. The first OM appears small and is occluded. The second OM is tiny. The terminal OM has segmental 50-60% disease.  Right coronary artery (RCA): 100% proximal occlusion.  The free radial graft to the OM is 100% occluded at the ostium.  The RIMA graft to the RCA is a large graft and widely patent.  The LIMA graft to the LAD is a large graft and is widely patent. It gives collaterals to an RV marginal branch.  Left ventriculography: Left ventricular systolic function is normal, LVEF is estimated at 50-55%, There is hypokinesis of the mid anterior wall, there is no significant mitral regurgitation  Final Conclusions:  1. Severe 3 vessel occlusive CAD  2. Patent LIMA to the LAD  3. Patent RIMA to the RCA  4. Occluded free radial graft to the OM  5. Good LV function.  Recommendations: Medical management.   Ranexa stopped last visit . 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 Seen by [facility] neuro and started on depakote for cluster headaches   BP better controlled with hydralazine added   08/13/15  Duplex reviewed Left ICA 60-79% stenosis.  No TIA symptoms.  Continue antiplatelet Rx and F/U  carotid duplex in May 2017   11/2014  EVAR [place] per Dr Scot Dock 2.8 cm sack size stable   Seen in ER 10/06/15 with 3 hrs of dyspnea sudden onset.  W/U negative normal enzymes, d-dimer and BNP CXR with NAD reviewed visit.  D/C home  [place] since d/c with no recurrence   ROS: Denies fever, malais, weight loss, blurry vision, decreased visual acuity, cough, sputum, SOB, hemoptysis, pleuritic pain, palpitaitons, heartburn, abdominal pain, melena, lower extremity edema, claudication, or rash.  All other systems reviewed and negative  General: Affect appropriate Healthy:  appears stated age 15: normal poor dentition  Neck supple with no adenopathy JVP normal bilateral bruits no thyromegaly Lungs clear with no wheezing and good diaphragmatic motion Heart:  S1/S2 SEM murmur, no rub, gallop or click PMI normal Abdomen: benighn, BS positve, no tenderness, no AAA no bruit.  No HSM or HJR Distal pulses intact with no bruits left radial harvested  No edema Neuro non-focal Skin warm and dry No muscular weakness   Current Outpatient Prescriptions  Medication Sig Dispense Refill  . aspirin EC 81 MG EC tablet Take 1 tablet (81 mg total) by mouth daily.    . Cholecalciferol (VITAMIN D3 PO) Take 1 tablet by mouth daily.    . clopidogrel (PLAVIX) 75 MG tablet TAKE 1 TABLET BY MOUTH DAILY 90 tablet 1  . diclofenac sodium (VOLTAREN) 1 % GEL Apply 2  g topically 4 (four) times daily as needed (pain).    Marland Kitchen divalproex (DEPAKOTE ER) 500 MG 24 hr tablet TAKE 1 TABLET BY MOUTH EVERY DAY 30 tablet 3  . doxycycline (VIBRA-TABS) 100 MG tablet Take 50 mg by mouth daily.     . ferrous fumarate (HEMOCYTE - 106 MG FE) 325 (106 FE) MG TABS tablet Take 1 tablet by mouth daily.    . fish oil-omega-3 fatty acids 1000 MG capsule Take 1,200 mg by mouth 2 (two) times daily.     . Glucosamine-Chondroitin (GLUCOSAMINE CHONDR COMPLEX PO) Take 500 mg by mouth 2 (two) times daily.     . hydrALAZINE (APRESOLINE) 50 MG tablet Take  50 mg by mouth daily.    . hydrochlorothiazide (MICROZIDE) 12.5 MG capsule TAKE 1 CAPSULE BY MOUTH DAILY 90 capsule 0  . isosorbide mononitrate (IMDUR) 30 MG 24 hr tablet TAKE 1 TABLET BY MOUTH EVERY DAY 30 tablet 0  . JUBLIA 10 % SOLN GENTLY APPLY A THIN LAYER TO AFFECTED NAIL(S) ONCE DAILY AS DIRECTED 8 mL 11  . Multiple Vitamin (MULTIVITAMIN) capsule Take 1 capsule by mouth daily.    . niacin (NIASPAN) 1000 MG CR tablet Take 1,000 mg by mouth 2 (two) times daily.      Marland Kitchen NITROSTAT 0.4 MG SL tablet PLACE 1 TABLET UNDER TONGUE EVERY 5 MINUTES FOR 3 DOSES AS NEEDED FOR CHEST PAIN 25 tablet 2  . potassium chloride SA (K-DUR,KLOR-CON) 20 MEQ tablet Take 20 mEq by mouth daily.    . ramipril (ALTACE) 10 MG capsule TAKE 1 CAPSULE BY MOUTH TWICE DAILY 60 capsule 7  . tiZANidine (ZANAFLEX) 4 MG tablet Take 4 mg by mouth daily as needed for muscle spasms.    . vitamin C (ASCORBIC ACID) 500 MG tablet Take 500 mg by mouth daily.    Marland Kitchen ZETIA 10 MG tablet TAKE 1 TABLET BY MOUTH DAILY 90 tablet 1  . [DISCONTINUED] metoprolol (LOPRESSOR) 50 MG tablet Take 1 tablet (50 mg total) by mouth daily. 1/2 tab po once daily     No current facility-administered medications for this visit.    Allergies  Statins  Electrocardiogram:  07/17/14  SR possible old IMI no change from 2014   10/15/14 SR rate 59 PR 224  LAFB  10/06/15  SR LAFB no acute changes in ER   Assessment and Plan  CAD: Stable with no angina and good activity level.  Continue medical Rx Tolerating being off Ranexa  SVG OM occluded but small native vessel with no critical stenosis   AAA:  Post EVAR f/u Scot Dock residual lumen 2.8 cm  Bruit: Left ICA 60-79% stenosis.  No TIA symptoms.  Continue antiplatelet Rx and F/U carotid duplex in May 2017   HTN: Well controlled.  Continue current medications and low sodium Dash type diet.    Chol:   Cholesterol is at goal.  Continue current dose of statin and diet Rx.  No myalgias or side effects.  F/U  LFT's  in 6 months. Lab Results  Component Value Date   LDLCALC 131* 09/06/2012  Labs with primary          Dyspnea: resolved no obvious etiology CXR/BNP ok  Exam ok today observe     Stop Ranexa  F/U with me 6 months   Jenkins Rouge

## 2015-11-03 ENCOUNTER — Encounter: Payer: Self-pay | Admitting: Cardiovascular Disease

## 2015-11-03 ENCOUNTER — Ambulatory Visit (INDEPENDENT_AMBULATORY_CARE_PROVIDER_SITE_OTHER): Payer: Managed Care, Other (non HMO) | Admitting: Cardiovascular Disease

## 2015-11-03 VITALS — BP 134/68 | HR 56 | Ht 70.0 in | Wt 191.8 lb

## 2015-11-03 DIAGNOSIS — I25810 Atherosclerosis of coronary artery bypass graft(s) without angina pectoris: Secondary | ICD-10-CM | POA: Diagnosis not present

## 2015-11-03 DIAGNOSIS — I480 Paroxysmal atrial fibrillation: Secondary | ICD-10-CM | POA: Diagnosis not present

## 2015-11-03 NOTE — Patient Instructions (Addendum)
Medication Instructions:  Your physician recommends that you continue on your current medications as directed. Please refer to the Current Medication list given to you today.  Labwork: NONE  Testing/Procedures: Your physician has requested that you have a carotid duplex in May. This test is an ultrasound of the carotid arteries in your neck. It looks at blood flow through these arteries that supply the brain with blood. Allow one hour for this exam. There are no restrictions or special instructions.  Follow-Up: Your physician wants you to follow-up in: May with Dr. Johnsie Cancel. You will receive a reminder letter in the mail two months in advance. If you don't receive a letter, please call our office to schedule the follow-up appointment.   If you need a refill on your cardiac medications before your next appointment, please call your pharmacy.

## 2015-11-11 ENCOUNTER — Other Ambulatory Visit: Payer: Self-pay | Admitting: Cardiovascular Disease

## 2015-11-19 ENCOUNTER — Other Ambulatory Visit: Payer: Self-pay | Admitting: Cardiovascular Disease

## 2015-12-08 ENCOUNTER — Telehealth: Payer: Self-pay | Admitting: Neurology

## 2015-12-08 MED ORDER — DIVALPROEX SODIUM ER 500 MG PO TB24: 500.0000 mg | ORAL_TABLET | Freq: Every day | ORAL | Status: DC

## 2015-12-08 NOTE — Telephone Encounter (Signed)
Attempted to reach wife, Eustaquio Maize. Message was left on VM. Did receive fax from [facility] ([phone]) for Depakote. Pt not seen since Feb. 2015. Refills only done when pt has been seen < 1 year ago.

## 2015-12-08 NOTE — Telephone Encounter (Signed)
VM-PT's wife Eustaquio Maize called in regards to medication and would like a call back/Dawn CB# [phone]

## 2015-12-08 NOTE — Telephone Encounter (Signed)
Spoke w/ wife. Appointment made. RX sent in for exactly enough to make it til next appointment.

## 2016-01-09 ENCOUNTER — Telehealth: Payer: Self-pay | Admitting: Cardiovascular Disease

## 2016-01-09 NOTE — Telephone Encounter (Signed)
New message      Talk to the nurse to see if Dr Johnsie Cancel will start refilling his didalproex.  It was originally prescribed by Dr Tomi Likens.  Please call it in at [facility] and call the pt back to let him know what Dr Johnsie Cancel said.

## 2016-01-12 NOTE — Telephone Encounter (Signed)
Informed patient that Dr. Johnsie Cancel will refill cardiac medications. Encouraged patient to call Dr. Georgie Chard office or Dr. Sharlet Salina, his PCP for refill on Divalproex. Patient verbalized understanding.

## 2016-01-13 ENCOUNTER — Encounter: Payer: Self-pay | Admitting: Neurology

## 2016-01-13 ENCOUNTER — Ambulatory Visit (INDEPENDENT_AMBULATORY_CARE_PROVIDER_SITE_OTHER): Payer: Managed Care, Other (non HMO) | Admitting: Neurology

## 2016-01-13 ENCOUNTER — Other Ambulatory Visit (INDEPENDENT_AMBULATORY_CARE_PROVIDER_SITE_OTHER): Payer: Managed Care, Other (non HMO)

## 2016-01-13 ENCOUNTER — Other Ambulatory Visit: Payer: Self-pay | Admitting: Cardiovascular Disease

## 2016-01-13 VITALS — BP 120/68 | HR 88 | Ht 70.0 in | Wt 186.0 lb

## 2016-01-13 DIAGNOSIS — G44019 Episodic cluster headache, not intractable: Secondary | ICD-10-CM

## 2016-01-13 LAB — HEPATIC FUNCTION PANEL
ALBUMIN: 4.3 g/dL (ref 3.5–5.2)
ALT: 16 U/L (ref 0–53)
AST: 20 U/L (ref 0–37)
Alkaline Phosphatase: 44 U/L (ref 39–117)
BILIRUBIN TOTAL: 0.8 mg/dL (ref 0.2–1.2)
Bilirubin, Direct: 0.2 mg/dL (ref 0.0–0.3)
Total Protein: 6.8 g/dL (ref 6.0–8.3)

## 2016-01-13 LAB — CBC
HEMATOCRIT: 38.4 % — AB (ref 39.0–52.0)
HEMOGLOBIN: 13.1 g/dL (ref 13.0–17.0)
MCHC: 34 g/dL (ref 30.0–36.0)
MCV: 95.3 fl (ref 78.0–100.0)
Platelets: 248 10*3/uL (ref 150.0–400.0)
RBC: 4.03 Mil/uL — ABNORMAL LOW (ref 4.22–5.81)
RDW: 14.9 % (ref 11.5–15.5)
WBC: 8.4 10*3/uL (ref 4.0–10.5)

## 2016-01-13 MED ORDER — DIVALPROEX SODIUM ER 500 MG PO TB24: 500.0000 mg | ORAL_TABLET | Freq: Every day | ORAL | Status: DC

## 2016-01-13 NOTE — Progress Notes (Signed)
NEUROLOGY FOLLOW UP OFFICE NOTE  PRYOR REVILLA [id number]  HISTORY OF PRESENT ILLNESS: [name] Brown is a 65 year old right-handed man with history of hypertension, hyperlipidemia, bradycardia, CAD s/p CABG, MI and abdominal aortic aneurysm who follows up for cluster headache.    UPDATE: He had been well-controlled on verapamil, but due to cardiac co-morbidities, it was recommended to discontinue verapamil.  I saw him on 12/04/13.  At that time, we switched to Depakote ER 500mg  daily.  However, he never followed up.  He is doing well on Depakote.  He has not had any clusters.  He missed a dose for a couple of days here and there and noted a mild tension in his head, but never a full cluster headache.  Labs from 10/06/15 include CBC with WBC 8.8, Hgb 13.2, HCT 39 and PLT 201.  BMP unremarkable.  Last LFTs from 02/21/15 were normal.  HISTORY: He was diagnosed with Cluster headaches about 20 years ago.  They start as aching under his eyes (usually left eye) and then into his teeth and gums, and over 10-15 minutes developed a severe debilitating pain like his "tooth being pulled through my eye socket".  No associated symptoms.  It lasts about 30 minutes.  He can't sit still.  It always occurred around 9:30 at night.  It would occur for 2 to 3 weeks every 2-3 months.  He has been well-controlled on verapamil 120mg  twice daily and has not had an attack in 20 years.  He had a recent heart attack and has a significant cardiac history.  He has baseline bradycardia (usually in the 40s) and it was recommended by his cardiologist to get off of the verapamil if possible.  He tried taking just one tablet and began having a similar headache again, although to a lesser degree.  He went back to twice a day.  For acute pain, he takes acetaminophen.  It is not too effective.  In the past, oxycodone was effective.  He never tried O2.  PAST MEDICAL HISTORY: Past Medical History  Diagnosis Date  . Bradycardia   .  Hyperlipidemia   . HTN (hypertension)   . CAD (coronary artery disease) CABG 03/2000    Median sternotomy for coronary artery bypass grafting x 3 (left  . Aortic aneurysm ([place]) Endograf 2009  . Myocardial infarction [facility])     MEDICATIONS: Current Outpatient Prescriptions on File Prior to Visit  Medication Sig Dispense Refill  . aspirin EC 81 MG EC tablet Take 1 tablet (81 mg total) by mouth daily.    . Cholecalciferol (VITAMIN D3 PO) Take 1 tablet by mouth daily.    . clopidogrel (PLAVIX) 75 MG tablet TAKE 1 TABLET BY MOUTH DAILY 90 tablet 1  . diclofenac sodium (VOLTAREN) 1 % GEL Apply 2 g topically 4 (four) times daily as needed (pain).    Marland Kitchen doxycycline (VIBRA-TABS) 100 MG tablet Take 50 mg by mouth daily.     . ferrous fumarate (HEMOCYTE - 106 MG FE) 325 (106 FE) MG TABS tablet Take 1 tablet by mouth daily.    . fish oil-omega-3 fatty acids 1000 MG capsule Take 1,200 mg by mouth 2 (two) times daily.     . Glucosamine-Chondroitin (GLUCOSAMINE CHONDR COMPLEX PO) Take 500 mg by mouth 2 (two) times daily.     . hydrALAZINE (APRESOLINE) 50 MG tablet Take 50 mg by mouth daily.    . hydrochlorothiazide (MICROZIDE) 12.5 MG capsule Take 1 capsule (12.5 mg total) by  mouth daily. 90 capsule 3  . isosorbide mononitrate (IMDUR) 30 MG 24 hr tablet TAKE 1 TABLET BY MOUTH EVERY DAY 90 tablet 3  . JUBLIA 10 % SOLN GENTLY APPLY A THIN LAYER TO AFFECTED NAIL(S) ONCE DAILY AS DIRECTED 8 mL 11  . Multiple Vitamin (MULTIVITAMIN) capsule Take 1 capsule by mouth daily.    . niacin (NIASPAN) 1000 MG CR tablet Take 1,000 mg by mouth 2 (two) times daily.      . ramipril (ALTACE) 10 MG capsule TAKE 1 CAPSULE BY MOUTH TWICE DAILY 60 capsule 7  . tiZANidine (ZANAFLEX) 4 MG tablet Take 4 mg by mouth daily as needed for muscle spasms.    . vitamin C (ASCORBIC ACID) 500 MG tablet Take 500 mg by mouth daily.    Marland Kitchen NITROSTAT 0.4 MG SL tablet PLACE 1 TABLET UNDER TONGUE EVERY 5 MINUTES FOR 3 DOSES AS NEEDED FOR CHEST PAIN  (Patient not taking: Reported on 01/13/2016) 25 tablet 2  . [DISCONTINUED] metoprolol (LOPRESSOR) 50 MG tablet Take 1 tablet (50 mg total) by mouth daily. 1/2 tab po once daily     No current facility-administered medications on file prior to visit.    ALLERGIES: Allergies  Allergen Reactions  . Statins     Myalgias with elevated CPKs    FAMILY HISTORY: Family History  Problem Relation Age of Onset  . Heart attack Mother   . Heart disease Mother   . Heart attack Father   . Heart disease Father     SOCIAL HISTORY: Social History   Social History  . Marital Status: Married    Spouse Name: N/A  . Number of Children: N/A  . Years of Education: N/A   Occupational History  . Not on file.   Social History Main Topics  . Smoking status: Former Smoker -- 2.00 packs/day for 30 years    Types: Cigarettes    Quit date: 01/05/1992  . Smokeless tobacco: Never Used     Comment: quit 19 yrs ago  . Alcohol Use: No  . Drug Use: No  . Sexual Activity: Not on file   Other Topics Concern  . 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    REVIEW OF SYSTEMS: Constitutional: No fevers, chills, or sweats, no generalized fatigue, change in appetite Eyes: No visual changes, double vision, eye pain Ear, nose and throat: No hearing loss, ear pain, nasal congestion, sore throat Cardiovascular: No chest pain, palpitations Respiratory:  No shortness of breath at rest or with exertion, wheezes GastrointestinaI: No nausea, vomiting, diarrhea, abdominal pain, fecal incontinence Genitourinary:  No dysuria, urinary retention or frequency Musculoskeletal:  No neck pain, back pain Integumentary: No rash, pruritus, skin lesions Neurological: as above Psychiatric: No depression, insomnia, anxiety Endocrine: No palpitations, fatigue, diaphoresis, mood swings, change in appetite, change in weight, increased  thirst Hematologic/Lymphatic:  No anemia, purpura, petechiae. Allergic/Immunologic: no itchy/runny eyes, nasal congestion, recent allergic reactions, rashes  PHYSICAL EXAM: Filed Vitals:   01/13/16 1543  BP: 120/68  Pulse: 88   General: No acute distress.  Patient appears well-groomed.  normal body habitus. Head:  Normocephalic/atraumatic Eyes:  Fundoscopic exam unremarkable without vessel changes, exudates, hemorrhages or papilledema. Neck: supple, no paraspinal tenderness, full range of motion Heart:  Regular rate and rhythm Lungs:  Clear to auscultation bilaterally Back: No paraspinal tenderness Neurological Exam: alert and oriented to person, place, and time. Attention  span and concentration intact, recent and remote memory intact, fund of knowledge intact.  Speech fluent and not dysarthric, language intact.  CN II-XII intact. Fundoscopic exam unremarkable without vessel changes, exudates, hemorrhages or papilledema.  Bulk and tone normal, muscle strength 5/5 throughout.  Sensation to light touch, temperature and vibration intact.  Deep tendon reflexes 2+ throughout, toes downgoing.  Finger to nose and heel to shin testing intact.  Gait normal, Romberg negative.  IMPRESSION: Cluster headaches, stable  PLAN: 1.  Check CBC and LFTs 2.  Continue Depakote ER 500mg  daily.  He would like to know if his PCP can continue to refill the Depakote, since he is stable.  I told him that he should ask his PCP.  If his PCP is not comfortable with prescribing the Depakote, then he should follow up in one year.  15 minutes spent face to face with patient, over 50% spent discussing management.  Metta Clines, DO  CC:  Pricilla Holm, MD

## 2016-01-13 NOTE — Progress Notes (Signed)
Chart forwarded.  

## 2016-01-13 NOTE — Patient Instructions (Addendum)
1.  Continue Depakote ER 500mg  daily 2.  Check CBC and LFTs 3.  Your PCP can refill the Depakote from here, unless she does not feel comfortable.  Otherwise, follow up in one year or call with questions or concerns.

## 2016-01-14 ENCOUNTER — Telehealth: Payer: Self-pay

## 2016-01-14 NOTE — Telephone Encounter (Signed)
Left message on machine for pt to return call to the office.  

## 2016-01-14 NOTE — Telephone Encounter (Signed)
-----   Message from Pieter Partridge, DO sent at 01/13/2016  6:51 PM EDT ----- Labs look okay

## 2016-01-27 ENCOUNTER — Other Ambulatory Visit: Payer: Self-pay | Admitting: Cardiovascular Disease

## 2016-01-27 DIAGNOSIS — I6523 Occlusion and stenosis of bilateral carotid arteries: Secondary | ICD-10-CM

## 2016-01-27 DIAGNOSIS — I25810 Atherosclerosis of coronary artery bypass graft(s) without angina pectoris: Secondary | ICD-10-CM

## 2016-02-11 ENCOUNTER — Ambulatory Visit (HOSPITAL_COMMUNITY)
Admission: RE | Admit: 2016-02-11 | Discharge: 2016-02-11 | Disposition: A | Discharge status: Home / Self Care | Payer: Managed Care, Other (non HMO) | Source: Ambulatory Visit | Attending: Cardiovascular Disease | Admitting: Cardiovascular Disease

## 2016-02-11 DIAGNOSIS — Z87891 Personal history of nicotine dependence: Secondary | ICD-10-CM | POA: Diagnosis not present

## 2016-02-11 DIAGNOSIS — I2510 Atherosclerotic heart disease of native coronary artery without angina pectoris: Secondary | ICD-10-CM | POA: Insufficient documentation

## 2016-02-11 DIAGNOSIS — Z951 Presence of aortocoronary bypass graft: Secondary | ICD-10-CM | POA: Diagnosis not present

## 2016-02-11 DIAGNOSIS — I10 Essential (primary) hypertension: Secondary | ICD-10-CM | POA: Insufficient documentation

## 2016-02-11 DIAGNOSIS — I6523 Occlusion and stenosis of bilateral carotid arteries: Secondary | ICD-10-CM | POA: Insufficient documentation

## 2016-02-11 DIAGNOSIS — I25810 Atherosclerosis of coronary artery bypass graft(s) without angina pectoris: Secondary | ICD-10-CM

## 2016-02-11 DIAGNOSIS — I714 Abdominal aortic aneurysm, without rupture: Secondary | ICD-10-CM | POA: Diagnosis not present

## 2016-02-11 DIAGNOSIS — E785 Hyperlipidemia, unspecified: Secondary | ICD-10-CM | POA: Diagnosis not present

## 2016-02-12 ENCOUNTER — Other Ambulatory Visit: Payer: Self-pay | Admitting: *Deleted

## 2016-02-12 ENCOUNTER — Telehealth: Payer: Self-pay | Admitting: Cardiovascular Disease

## 2016-02-12 MED ORDER — NITROGLYCERIN 0.4 MG SL SUBL: SUBLINGUAL_TABLET | SUBLINGUAL | Status: DC

## 2016-02-12 NOTE — Telephone Encounter (Signed)
New message      *STAT* If patient is at the pharmacy, call can be transferred to refill team.   1. Which medications need to be refilled? (please list name of each medication and dose if known) nitrostat 0.4mg  2. Which pharmacy/location (including street and city if local pharmacy) is medication to be sent to? [email or web address]  3. Do they need a 30 day or 90 day supply? Not sure--- Pt received original presc in the hosp

## 2016-03-01 NOTE — Progress Notes (Signed)
Patient ID: [name] Brown, male   DOB: 1951-09-15, 65 y.o.   MRN: [id number]  The pt is a 65 y.o.  male w/ PMHx significant for CAD s/p CABG x 3 ( LIMA to LAD, RIMA to PDA, L radial to OM1). He is s/p stent in 2008, s/p EVAR 2009 for AAA,  Presented to [facility]15 with chest pain. Cath by Dr [place] showed stable disease for medical Rx   Cath 10/25/13 [place] Films reviewed  Coronary angiography:  Coronary dominance: right  Left mainstem: Heavily calcified and tortuous. 40% distal stenosis.  Left anterior descending (LAD): Very complex proximal disease up to 95%. Heavy calcification. The LAD is occluded after the first diagonal and first septal perforator. The first diagonal is a moderate sized vessel with 40% disease.  Left circumflex (LCx): The left circumflex is calcified. There is 30-40% disease in the mid vessel. The first OM appears small and is occluded. The second OM is tiny. The terminal OM has segmental 50-60% disease.  Right coronary artery (RCA): 100% proximal occlusion.  The free radial graft to the OM is 100% occluded at the ostium.  The RIMA graft to the RCA is a large graft and widely patent.  The LIMA graft to the LAD is a large graft and is widely patent. It gives collaterals to an RV marginal branch.  Left ventriculography: Left ventricular systolic function is normal, LVEF is estimated at 50-55%, There is hypokinesis of the mid anterior wall, there is no significant mitral regurgitation   Final Conclusions:  1. Severe 3 vessel occlusive CAD  2. Patent LIMA to the LAD  3. Patent RIMA to the RCA  4. Occluded free radial graft to the OM  5. Good LV function.  Recommendations: Medical management.   No longer taking Ranexa  Verapamil d/c due to some AV block . Seen by [place] neuro and started on depakote for cluster headaches    BP better controlled with hydralazine added   01/30/15 Duplex reviewed Left ICA 60-79% stenosis.  No TIA symptoms.  Continue antiplatelet Rx and F/U  carotid duplex in October   11/2014  EVAR [place] per Dr Scot Dock 2.8 cm sack size stable   Last visit tried to stop Ranexa.    ROS: Denies fever, malais, weight loss, blurry vision, decreased visual acuity, cough, sputum, SOB, hemoptysis, pleuritic pain, palpitaitons, heartburn, abdominal pain, melena, lower extremity edema, claudication, or rash.  All other systems reviewed and negative  General: Affect appropriate Healthy:  appears stated age 35: normal poor dentition  Neck supple with no adenopathy JVP normal bilateral bruits no thyromegaly Lungs clear with no wheezing and good diaphragmatic motion Heart:  S1/S2 SEM murmur, no rub, gallop or click PMI normal Abdomen: benighn, BS positve, no tenderness, no AAA no bruit.  No HSM or HJR Distal pulses intact with no bruits left radial harvested  No edema Neuro non-focal Skin warm and dry No muscular weakness   Current Outpatient Prescriptions  Medication Sig Dispense Refill  . aspirin EC 81 MG EC tablet Take 1 tablet (81 mg total) by mouth daily.    . Cholecalciferol (VITAMIN D3 PO) Take 1 tablet by mouth daily.    . clopidogrel (PLAVIX) 75 MG tablet TAKE 1 TABLET BY MOUTH DAILY 90 tablet 1  . diclofenac sodium (VOLTAREN) 1 % GEL Apply 2 g topically 4 (four) times daily as needed (pain).    Marland Kitchen divalproex (DEPAKOTE ER) 500 MG 24 hr tablet Take 1 tablet (500 mg total) by  mouth daily. 30 tablet 11  . doxycycline (VIBRA-TABS) 100 MG tablet Take 50 mg by mouth daily.     Marland Kitchen ezetimibe (ZETIA) 10 MG tablet TAKE 1 TABLET BY MOUTH DAILY 90 tablet 2  . ferrous fumarate (HEMOCYTE - 106 MG FE) 325 (106 FE) MG TABS tablet Take 1 tablet by mouth daily.    . fish oil-omega-3 fatty acids 1000 MG capsule Take 1,200 mg by mouth 2 (two) times daily.     . Glucosamine-Chondroitin (GLUCOSAMINE CHONDR COMPLEX PO) Take 500 mg by mouth 2 (two) times daily.     . hydrALAZINE (APRESOLINE) 50 MG tablet Take 50 mg by mouth daily.    . hydrochlorothiazide  (MICROZIDE) 12.5 MG capsule Take 1 capsule (12.5 mg total) by mouth daily. 90 capsule 3  . isosorbide mononitrate (IMDUR) 30 MG 24 hr tablet TAKE 1 TABLET BY MOUTH EVERY DAY 90 tablet 3  . JUBLIA 10 % SOLN GENTLY APPLY A THIN LAYER TO AFFECTED NAIL(S) ONCE DAILY AS DIRECTED 8 mL 11  . Multiple Vitamin (MULTIVITAMIN) capsule Take 1 capsule by mouth daily.    . niacin (NIASPAN) 1000 MG CR tablet Take 1,000 mg by mouth 2 (two) times daily.      . nitroGLYCERIN (NITROSTAT) 0.4 MG SL tablet PLACE 1 TABLET UNDER TONGUE EVERY 5 MINUTES FOR 3 DOSES AS NEEDED FOR CHEST PAIN 25 tablet 1  . potassium chloride SA (K-DUR,KLOR-CON) 20 MEQ tablet TAKE 1 TABLET BY MOUTH EVERY DAY 90 tablet 2  . ramipril (ALTACE) 10 MG capsule TAKE 1 CAPSULE BY MOUTH TWICE DAILY 60 capsule 7  . tiZANidine (ZANAFLEX) 4 MG tablet Take 4 mg by mouth daily as needed for muscle spasms.    . vitamin C (ASCORBIC ACID) 500 MG tablet Take 500 mg by mouth daily.    . [DISCONTINUED] metoprolol (LOPRESSOR) 50 MG tablet Take 1 tablet (50 mg total) by mouth daily. 1/2 tab po once daily     No current facility-administered medications for this visit.    Allergies  Statins  Electrocardiogram:  07/17/14  SR possible old IMI no change from 2014   10/15/14 SR rate 59 PR 224  LAFB   Assessment and Plan CAD: Stable with no angina and good activity level.  Continue medical Rx  AAA:  Post EVAR f/u Scot Dock residual lumen 2.8 cm  Bruit: Left ICA 60-79% stenosis. Duplex 02/11/16   No TIA symptoms.  Continue antiplatelet Rx and F/U carotid duplex in May 2018   HTN: Well controlled.  Continue current medications and low sodium Dash type diet.    Chol:   Cholesterol is at goal.  Continue current dose of statin and diet Rx.  No myalgias or side effects.  F/U  LFT's in 6 months. Lab Results  Component Value Date   LDLCALC 131* 09/06/2012  Labs with primary            Stop Ranexa F/U with me 6 months

## 2016-03-03 ENCOUNTER — Encounter: Payer: Self-pay | Admitting: Cardiovascular Disease

## 2016-03-03 ENCOUNTER — Ambulatory Visit (INDEPENDENT_AMBULATORY_CARE_PROVIDER_SITE_OTHER): Payer: Managed Care, Other (non HMO) | Admitting: Cardiovascular Disease

## 2016-03-03 VITALS — BP 126/54 | HR 62 | Ht 70.0 in | Wt 187.8 lb

## 2016-03-03 DIAGNOSIS — I2510 Atherosclerotic heart disease of native coronary artery without angina pectoris: Secondary | ICD-10-CM | POA: Diagnosis not present

## 2016-03-03 DIAGNOSIS — I2583 Coronary atherosclerosis due to lipid rich plaque: Principal | ICD-10-CM

## 2016-03-03 NOTE — Patient Instructions (Signed)

## 2016-04-20 ENCOUNTER — Other Ambulatory Visit: Payer: Self-pay | Admitting: Cardiovascular Disease

## 2016-05-04 ENCOUNTER — Other Ambulatory Visit: Payer: Self-pay | Admitting: Cardiovascular Disease

## 2016-05-11 ENCOUNTER — Telehealth: Payer: Self-pay

## 2016-05-12 MED ORDER — HYDRALAZINE HCL 50 MG PO TABS: 50.0000 mg | ORAL_TABLET | Freq: Two times a day (BID) | ORAL | 3 refills | Status: DC

## 2016-05-12 NOTE — Telephone Encounter (Signed)
Left message to call back  

## 2016-05-12 NOTE — Addendum Note (Signed)
Addended by: Aris Georgia, Ellyanna Holton L on: 05/12/2016 05:07 PM   Modules accepted: Orders

## 2016-05-12 NOTE — Telephone Encounter (Signed)
Ok to fill 50 bid

## 2016-05-12 NOTE — Telephone Encounter (Signed)
Wanted clarification to reorder patient's hydralazine. Patient states he has been taking hydralazine 50 mg BID since 06/2014.

## 2016-05-13 ENCOUNTER — Encounter: Payer: Self-pay | Admitting: Vascular Surgery

## 2016-05-19 ENCOUNTER — Ambulatory Visit (HOSPITAL_COMMUNITY)
Admission: RE | Admit: 2016-05-19 | Discharge: 2016-05-19 | Disposition: A | Discharge status: Home / Self Care | Payer: Managed Care, Other (non HMO) | Source: Ambulatory Visit | Attending: Vascular Surgery | Admitting: Vascular Surgery

## 2016-05-19 ENCOUNTER — Encounter: Payer: Self-pay | Admitting: Vascular Surgery

## 2016-05-19 ENCOUNTER — Ambulatory Visit (INDEPENDENT_AMBULATORY_CARE_PROVIDER_SITE_OTHER): Payer: Managed Care, Other (non HMO) | Admitting: Vascular Surgery

## 2016-05-19 VITALS — BP 132/74 | HR 53 | Ht 70.0 in | Wt 185.0 lb

## 2016-05-19 DIAGNOSIS — I714 Abdominal aortic aneurysm, without rupture, unspecified: Secondary | ICD-10-CM

## 2016-05-19 DIAGNOSIS — E785 Hyperlipidemia, unspecified: Secondary | ICD-10-CM | POA: Insufficient documentation

## 2016-05-19 DIAGNOSIS — I10 Essential (primary) hypertension: Secondary | ICD-10-CM | POA: Diagnosis not present

## 2016-05-19 DIAGNOSIS — Z48812 Encounter for surgical aftercare following surgery on the circulatory system: Secondary | ICD-10-CM | POA: Insufficient documentation

## 2016-05-19 DIAGNOSIS — I2510 Atherosclerotic heart disease of native coronary artery without angina pectoris: Secondary | ICD-10-CM | POA: Diagnosis not present

## 2016-05-19 NOTE — Progress Notes (Signed)
[facility] [place]  Patient name: [name] Brown MRN: [id number] DOB: [birth year] Sex: male  REASON FOR VISIT: Follow up after EVAR  HPI: [name] Brown is a 65 y.o. male who I last saw on 11/20/2014. He underwent an EVAR on February 2009 from 5.3 cm infrarenal abdominal aortic aneurysm. At the time of his last visit his aneurysm had shrunk down to 3.2 cm and then been stable for 18 months. I felt that we could stretch his follow up out to 2 years if it remains stable.  Since I saw him last he denies any history of abdominal pain or back pain. He quit smoking 25 years ago. He is on aspirin. He hasn't allergies to statins and therefore is not on a statin but is on Zetia.  Past Medical History:  Diagnosis Date  . Aortic aneurysm ([place]) Endograf 2009  . Bradycardia   . CAD (coronary artery disease) CABG 03/2000   Median sternotomy for coronary artery bypass grafting x 3 (left  . HTN (hypertension)   . Hyperlipidemia   . Myocardial infarction [facility])     Family History  Problem Relation Age of Onset  . Heart attack Mother   . Heart disease Mother   . Heart attack Father   . Heart disease Father     SOCIAL HISTORY: Social History  Substance Use Topics  . Smoking status: Former Smoker    Packs/day: 2.00    Years: 30.00    Types: Cigarettes    Quit date: 01/05/1992  . Smokeless tobacco: Never Used     Comment: quit 19 yrs ago  . Alcohol use No    Allergies  Allergen Reactions  . Statins     Myalgias with elevated CPKs    Current Outpatient Prescriptions  Medication Sig Dispense Refill  . aspirin EC 81 MG EC tablet Take 1 tablet (81 mg total) by mouth daily.    . Cholecalciferol (VITAMIN D3 PO) Take 1 tablet by mouth daily.    . clopidogrel (PLAVIX) 75 MG tablet TAKE 1 TABLET BY MOUTH DAILY 90 tablet 3  . diclofenac sodium (VOLTAREN) 1 % GEL Apply 2 g topically 4 (four) times daily as needed (pain).    Marland Kitchen divalproex (DEPAKOTE ER) 500 MG 24 hr tablet  Take 1 tablet (500 mg total) by mouth daily. 30 tablet 11  . doxycycline (VIBRA-TABS) 100 MG tablet Take 50 mg by mouth daily.     Marland Kitchen ezetimibe (ZETIA) 10 MG tablet TAKE 1 TABLET BY MOUTH DAILY 90 tablet 2  . ferrous fumarate (HEMOCYTE - 106 MG FE) 325 (106 FE) MG TABS tablet Take 1 tablet by mouth daily.    . fish oil-omega-3 fatty acids 1000 MG capsule Take 1,200 mg by mouth 2 (two) times daily.     . Glucosamine-Chondroitin (GLUCOSAMINE CHONDR COMPLEX PO) Take 500 mg by mouth 2 (two) times daily.     . hydrALAZINE (APRESOLINE) 50 MG tablet Take 1 tablet (50 mg total) by mouth 2 (two) times daily. 180 tablet 3  . hydrochlorothiazide (MICROZIDE) 12.5 MG capsule Take 1 capsule (12.5 mg total) by mouth daily. 90 capsule 3  . isosorbide mononitrate (IMDUR) 30 MG 24 hr tablet TAKE 1 TABLET BY MOUTH EVERY DAY 90 tablet 3  . JUBLIA 10 % SOLN GENTLY APPLY A THIN LAYER TO AFFECTED NAIL(S) ONCE DAILY AS DIRECTED 8 mL 11  . Multiple Vitamin (MULTIVITAMIN) capsule Take 1 capsule by mouth daily.    . niacin (  NIASPAN) 1000 MG CR tablet Take 1,000 mg by mouth 2 (two) times daily.      . nitroGLYCERIN (NITROSTAT) 0.4 MG SL tablet PLACE 1 TABLET UNDER TONGUE EVERY 5 MINUTES FOR 3 DOSES AS NEEDED FOR CHEST PAIN 25 tablet 1  . potassium chloride SA (K-DUR,KLOR-CON) 20 MEQ tablet TAKE 1 TABLET BY MOUTH EVERY DAY 90 tablet 2  . ramipril (ALTACE) 10 MG capsule TAKE 1 CAPSULE BY MOUTH TWICE DAILY 60 capsule 7  . tiZANidine (ZANAFLEX) 4 MG tablet Take 4 mg by mouth daily as needed for muscle spasms.    . vitamin C (ASCORBIC ACID) 500 MG tablet Take 500 mg by mouth daily.     No current facility-administered medications for this visit.     REVIEW OF SYSTEMS:  [X]  denotes positive finding, [ ]  denotes negative finding Cardiac  Comments:  Chest pain or chest pressure:    Shortness of breath upon exertion:    Short of breath when lying flat:    Irregular heart rhythm:        Vascular    Pain in calf, thigh, or  hip brought on by ambulation:    Pain in feet at night that wakes you up from your sleep:     Blood clot in your veins:    Leg swelling:         Pulmonary    Oxygen at home:    Productive cough:     Wheezing:         Neurologic    Sudden weakness in arms or legs:     Sudden numbness in arms or legs:     Sudden onset of difficulty speaking or slurred speech:    Temporary loss of vision in one eye:     Problems with dizziness:         Gastrointestinal    Blood in stool:     Vomited blood:         Genitourinary    Burning when urinating:     Blood in urine:        Psychiatric    Major depression:         Hematologic    Bleeding problems:    Problems with blood clotting too easily:        Skin    Rashes or ulcers:        Constitutional    Fever or chills:      PHYSICAL EXAM: Vitals:   05/19/16 0843  BP: 132/74  Pulse: (!) 53  SpO2: 99%  Weight: 185 lb (83.9 kg)  Height: 5\' 10"  (1.778 m)    GENERAL: The patient is a well-nourished male, in no acute distress. The vital signs are documented above. CARDIAC: There is a regular rate and rhythm.  VASCULAR: I do not detect carotid bruits. He has palpable femoral pulses bilaterally. PULMONARY: There is good air exchange bilaterally without wheezing or rales. ABDOMEN: Soft and non-tender with normal pitched bowel sounds.  MUSCULOSKELETAL: There are no major deformities or cyanosis. NEUROLOGIC: No focal weakness or paresthesias are detected. SKIN: There are no ulcers or rashes noted. PSYCHIATRIC: The patient has a normal affect.  DATA:   DUPLEX ABDOMINAL AORTA: I have independently interpreted the duplex of his EVAR. The maximum diameter of the aneurysm is 3.2 cm and has not changed since his last study 18 months ago. His original aneurysm was 5.3 cm in maximum size.  MEDICAL ISSUES:  STATUS POST EVAR IN 2009: The patient  is doing well status EVAR in 2009. The aneurysm was 5.3 cm in maximum diameter and shrunk down  to 3.2 cm. It has remained stable in size. For this reason I recommend we stretches follow up out to 2 years. He is not a smoker. He is on aspirin but is not on a statin because of an allergy to statins. I will see him back in 2 years. He knows to call sooner if he has problems.    Deitra Mayo [facility] [place] [phone]

## 2016-06-09 ENCOUNTER — Other Ambulatory Visit: Payer: Self-pay | Admitting: Cardiovascular Disease

## 2016-06-11 NOTE — Addendum Note (Signed)
Addended by: Thresa Ross C on: 06/11/2016 04:00 PM   Modules accepted: Orders

## 2016-06-16 ENCOUNTER — Ambulatory Visit (INDEPENDENT_AMBULATORY_CARE_PROVIDER_SITE_OTHER): Payer: Managed Care, Other (non HMO)

## 2016-06-16 ENCOUNTER — Ambulatory Visit (INDEPENDENT_AMBULATORY_CARE_PROVIDER_SITE_OTHER): Payer: Managed Care, Other (non HMO) | Admitting: Podiatry

## 2016-06-16 ENCOUNTER — Encounter: Payer: Self-pay | Admitting: Podiatry

## 2016-06-16 VITALS — BP 157/78 | HR 49 | Resp 16

## 2016-06-16 DIAGNOSIS — M2042 Other hammer toe(s) (acquired), left foot: Secondary | ICD-10-CM | POA: Diagnosis not present

## 2016-06-16 DIAGNOSIS — M79671 Pain in right foot: Secondary | ICD-10-CM

## 2016-06-16 DIAGNOSIS — M79672 Pain in left foot: Secondary | ICD-10-CM

## 2016-06-16 DIAGNOSIS — M779 Enthesopathy, unspecified: Secondary | ICD-10-CM

## 2016-06-16 MED ORDER — TRIAMCINOLONE ACETONIDE 10 MG/ML IJ SUSP
10.0000 mg | Freq: Once | INTRAMUSCULAR | Status: AC
  Administered 2016-06-16: 10 mg

## 2016-06-17 NOTE — Progress Notes (Signed)
Subjective:     Patient ID: [name] Brown, male   DOB: 03-22-1951, 65 y.o.   MRN: [id number]  HPI patient presents stating he's got a digital deformity of the left foot and also pain in the joint that's been present for a period of time. Does not remember injury   Review of Systems     Objective:   Physical Exam Neurovascular status intact muscle strength adequate with inflammation and pain of the second MPJ left with fluid buildup around the joint and pain when palpated. Patient also has hammertoe deformity second left with mild dorsal redness of the toe of a recent nature    Assessment:     Probable flexor plate injury with inflammatory capsulitis second MPJ    Plan:     H&P x-rays reviewed and went ahead today and did a proximal block left second joint and then aspirated the second MPJ getting out of small amount of clear fluid and injected quarter cc dexamethasone Kenalog. Applied thick plantar pad to take pressure off the joint and explained the digital deformity may need to be corrected some day  X-ray report was negative for signs of fracture with mild movement of the toe at the metatarsophalangeal joint

## 2016-06-24 ENCOUNTER — Telehealth: Payer: Self-pay | Admitting: Cardiovascular Disease

## 2016-06-24 ENCOUNTER — Encounter: Payer: Self-pay | Admitting: Podiatry

## 2016-06-24 ENCOUNTER — Ambulatory Visit (INDEPENDENT_AMBULATORY_CARE_PROVIDER_SITE_OTHER): Payer: Managed Care, Other (non HMO) | Admitting: Podiatry

## 2016-06-24 DIAGNOSIS — M2042 Other hammer toe(s) (acquired), left foot: Secondary | ICD-10-CM | POA: Diagnosis not present

## 2016-06-24 DIAGNOSIS — M779 Enthesopathy, unspecified: Secondary | ICD-10-CM

## 2016-06-24 NOTE — Telephone Encounter (Signed)
New message        error

## 2016-06-24 NOTE — Progress Notes (Signed)
Subjective:     Patient ID: [name] Brown, male   DOB: 19-Jun-1951, 65 y.o.   MRN: [id number]  HPI patient states he still having a lot of pain in his second metatarsal and that he still has pain when he walks   Review of Systems     Objective:   Physical Exam Neurovascular status intact muscle strength adequate with inflammatory changes second metatarsal left with elevation of the second digit of a moderate nature and pain    Assessment:     Inflammatory capsulitis second MPJ left with pain    Plan:     H&P condition reviewed and advised on physical therapy anti-inflammatories and dispensed a air fracture walker to completely immobilize the plantar foot and take stress off the foot.. Patient be seen back 4 weeks

## 2016-07-02 ENCOUNTER — Ambulatory Visit (INDEPENDENT_AMBULATORY_CARE_PROVIDER_SITE_OTHER): Payer: Managed Care, Other (non HMO)

## 2016-07-02 ENCOUNTER — Encounter: Payer: Self-pay | Admitting: Podiatry

## 2016-07-02 ENCOUNTER — Ambulatory Visit (INDEPENDENT_AMBULATORY_CARE_PROVIDER_SITE_OTHER): Payer: Managed Care, Other (non HMO) | Admitting: Podiatry

## 2016-07-02 DIAGNOSIS — M79672 Pain in left foot: Secondary | ICD-10-CM | POA: Diagnosis not present

## 2016-07-02 NOTE — Patient Instructions (Signed)

## 2016-07-04 NOTE — Progress Notes (Signed)
Subjective:     Patient ID: [name] Brown, male   DOB: [birth year], 65 y.o.   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 I explained procedure and risk and patient wants surgery understanding all risk associated with this and at this time signs consent form given all preoperative instructions concerning procedure and the fact the toe will be stiff and may lift in the air postoperatively. I did explain alternative treatments and other complications and patient is comfortable with this wanting surgery and he signed consent form after review and is given all preoperative instructions. Patient understands is no guarantee as far success of surgery and the recovery can take from 6 months to one year

## 2016-07-05 ENCOUNTER — Telehealth: Payer: Self-pay | Admitting: *Deleted

## 2016-07-05 NOTE — Telephone Encounter (Signed)
"  I'm calling for my husband to set up surgery.  Please call me back.  Thank you."

## 2016-07-06 NOTE — Telephone Encounter (Signed)
"  I'm calling for my husband, [name] Brown.  We need to set up a surgery time for him.  Thank you."

## 2016-07-07 NOTE — Telephone Encounter (Signed)
"  I need to schedule an appointment for my foot surgery.  Thank you."  I attempted to return his call.  I left a message for him to call me back.  I then called and spoke to his wife.  We scheduled surgery for October 24.  I informed her that someone from the surgical center would call with the arrival time the Friday or Monday before.

## 2016-07-15 ENCOUNTER — Ambulatory Visit: Payer: Managed Care, Other (non HMO) | Admitting: Podiatry

## 2016-07-22 ENCOUNTER — Encounter: Payer: Self-pay | Admitting: Internal Medicine

## 2016-07-22 ENCOUNTER — Other Ambulatory Visit (INDEPENDENT_AMBULATORY_CARE_PROVIDER_SITE_OTHER): Payer: Managed Care, Other (non HMO)

## 2016-07-22 ENCOUNTER — Ambulatory Visit (INDEPENDENT_AMBULATORY_CARE_PROVIDER_SITE_OTHER): Payer: Managed Care, Other (non HMO) | Admitting: Internal Medicine

## 2016-07-22 VITALS — BP 128/80 | HR 56 | Temp 98.3°F | Resp 20 | Wt 184.4 lb

## 2016-07-22 DIAGNOSIS — R0600 Dyspnea, unspecified: Secondary | ICD-10-CM

## 2016-07-22 DIAGNOSIS — D509 Iron deficiency anemia, unspecified: Secondary | ICD-10-CM | POA: Diagnosis not present

## 2016-07-22 DIAGNOSIS — N400 Enlarged prostate without lower urinary tract symptoms: Secondary | ICD-10-CM

## 2016-07-22 LAB — COMPREHENSIVE METABOLIC PANEL
ALK PHOS: 47 U/L (ref 39–117)
ALT: 17 U/L (ref 0–53)
AST: 20 U/L (ref 0–37)
Albumin: 4.2 g/dL (ref 3.5–5.2)
BILIRUBIN TOTAL: 0.8 mg/dL (ref 0.2–1.2)
BUN: 17 mg/dL (ref 6–23)
CALCIUM: 9.8 mg/dL (ref 8.4–10.5)
CHLORIDE: 100 meq/L (ref 96–112)
CO2: 29 meq/L (ref 19–32)
Creatinine, Ser: 1 mg/dL (ref 0.40–1.50)
GFR: 79.8 mL/min (ref >60.00)
Glucose, Bld: 67 mg/dL — ABNORMAL LOW (ref 70–99)
Potassium: 4.8 mEq/L (ref 3.5–5.1)
Sodium: 136 mEq/L (ref 135–145)
Total Protein: 6.8 g/dL (ref 6.0–8.3)

## 2016-07-22 LAB — BRAIN NATRIURETIC PEPTIDE: Pro B Natriuretic peptide (BNP): 70 pg/mL (ref 0.0–100.0)

## 2016-07-22 LAB — CBC
HEMATOCRIT: 42.9 % (ref 39.0–52.0)
HEMOGLOBIN: 14.5 g/dL (ref 13.0–17.0)
MCHC: 33.8 g/dL (ref 30.0–36.0)
MCV: 96.8 fl (ref 78.0–100.0)
PLATELETS: 263 10*3/uL (ref 150.0–400.0)
RBC: 4.43 Mil/uL (ref 4.22–5.81)
RDW: 15.2 % (ref 11.5–15.5)
WBC: 7.6 10*3/uL (ref 4.0–10.5)

## 2016-07-22 LAB — LIPID PANEL
CHOLESTEROL: 152 mg/dL (ref 0–200)
HDL: 40.7 mg/dL (ref >39.00)
LDL Cholesterol: 100 mg/dL — ABNORMAL HIGH (ref 0–99)
NONHDL: 111.71
Total CHOL/HDL Ratio: 4
Triglycerides: 61 mg/dL (ref 0.0–149.0)
VLDL: 12.2 mg/dL (ref 0.0–40.0)

## 2016-07-22 MED ORDER — TAMSULOSIN HCL 0.4 MG PO CAPS
0.4000 mg | ORAL_CAPSULE | Freq: Every day | ORAL | 3 refills | Status: DC
Start: 2016-07-22 — End: 2016-11-17

## 2016-07-22 NOTE — Assessment & Plan Note (Signed)
Checking BNP and CMP and CBC today. If no etiology will get sleep study to check for sleep apnea. 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

## 2016-07-22 NOTE — Progress Notes (Signed)
Pre visit review using our clinic review tool, if applicable. No additional management support is needed unless otherwise documented below in the visit note. 

## 2016-07-22 NOTE — Progress Notes (Signed)
   Subjective:    Patient ID: [name] Brown, male    DOB: 1951-07-04, 65 y.o.   MRN: [id number]  HPI The patient is a 65 YO man coming in for several concerns. The first concern is nocturia for some time. Worsening in the last 6 months. Getting up 5-6 times at night to urinate. He does drink 3 non-alcoholic beers with dinner and a cup of coffee but then nothing until the next morning. Goes to bed around 9. This is disrupting his sleeping habits.  His wife is also concerned about his sleeping habits. He is having some difficulty breathing full breaths at night time. He is having some problems with her thinking he stops breathing at night time. Is having morning tiredness. No breathing problems with exertion or chest pains. Saw his cardiologist in May and did not tell them about these symptoms. Off and on for some time but worse in the last 6 months. He has some snoring as well. Has not tried anything for it. Does not happen every night.   Review of Systems  Constitutional: Negative for activity change, appetite change, fatigue, fever and unexpected weight change.  Respiratory: Positive for shortness of breath. Negative for cough, chest tightness and wheezing.        At night time with lying down  Cardiovascular: Negative for chest pain, palpitations and leg swelling.  Gastrointestinal: Negative for abdominal distention, abdominal pain, constipation, diarrhea and nausea.  Genitourinary: Positive for enuresis, frequency and urgency. Negative for decreased urine volume, difficulty urinating, discharge, dysuria, genital sores, hematuria, penile pain, penile swelling and scrotal swelling.  Musculoskeletal: Negative.   Skin: Negative.       Objective:   Physical Exam  Constitutional: He is oriented to person, place, and time. He appears well-developed and well-nourished.  HENT:  Head: Normocephalic and atraumatic.  Eyes: EOM are normal.  Neck: Normal range of motion.  Cardiovascular: Normal rate  and regular rhythm.   Pulmonary/Chest: Effort normal and breath sounds normal. No respiratory distress. He has no wheezes. He has no rales.  Abdominal: Soft. He exhibits no distension. There is no tenderness. There is no rebound.  Musculoskeletal: He exhibits no edema.  Neurological: He is alert and oriented to person, place, and time. Coordination normal.  Skin: Skin is warm and dry.   Vitals:   07/22/16 0829  BP: 128/80  Pulse: (!) 56  Resp: 20  Temp: 98.3 F (36.8 C)  TempSrc: Oral  SpO2: 97%  Weight: 184 lb 6 oz (83.6 kg)      Assessment & Plan:

## 2016-07-22 NOTE — Assessment & Plan Note (Signed)
Rx for flomax for night time symptoms. Also advised to cut back on the beverages with dinner but he is not willing to do that at this time.

## 2016-07-22 NOTE — Assessment & Plan Note (Signed)
Checking CBC to ensure new night time symptoms are not related to worsening anemia.

## 2016-07-22 NOTE — Patient Instructions (Signed)
We have sent in flomax for the prostate which helps to relax the prostate and keep you from feeling like you have to pee all the time.   Take 1 pill in the evening daily and you should notice within 2 weeks the benefit.   We are checking the labs today and if we do not find a good cause we will get a home sleep study done.

## 2016-07-26 ENCOUNTER — Other Ambulatory Visit: Payer: Self-pay | Admitting: Internal Medicine

## 2016-07-26 DIAGNOSIS — R0600 Dyspnea, unspecified: Secondary | ICD-10-CM

## 2016-07-29 ENCOUNTER — Encounter: Payer: Self-pay | Admitting: Geriatric Medicine

## 2016-08-03 ENCOUNTER — Telehealth: Payer: Self-pay | Admitting: *Deleted

## 2016-08-03 NOTE — Telephone Encounter (Signed)
He has severe coronary artery disease and needs cardiac clearance prior to surgery as well.

## 2016-08-03 NOTE — Telephone Encounter (Signed)
I attempted to call patient.  He was due to have surgery today.  He was not able to have surgery today per Dr. Paulla Dolly due to being on Plavix.  Patient was not advised to stop taking plavix prior to surgery.  Dr. Paulla Dolly wants Dr. Jacqualyn Posey to do procedure on Wednesday of next week and he suggested patient stop taking Plavix this Friday, October 27.  Dr. Paulla Dolly said to have patient come in for an appointment with Dr. Jacqualyn Posey for a consult at no charge to patient.

## 2016-08-06 ENCOUNTER — Ambulatory Visit (INDEPENDENT_AMBULATORY_CARE_PROVIDER_SITE_OTHER): Payer: Managed Care, Other (non HMO) | Admitting: Podiatry

## 2016-08-06 ENCOUNTER — Encounter: Payer: Self-pay | Admitting: *Deleted

## 2016-08-06 ENCOUNTER — Encounter: Payer: Self-pay | Admitting: Podiatry

## 2016-08-06 VITALS — BP 152/76 | HR 56 | Resp 14

## 2016-08-06 DIAGNOSIS — M79672 Pain in left foot: Secondary | ICD-10-CM

## 2016-08-06 DIAGNOSIS — M779 Enthesopathy, unspecified: Secondary | ICD-10-CM

## 2016-08-08 NOTE — Progress Notes (Signed)
Subjective: 65 year old male presents the office today for surgical consultation. He was scheduled with Dr. Paulla Dolly this week for surgery however he did not stop his Plavix of therefore surgery was canceled. Due to Dr. Paulla Dolly schedule and him being out of the office is referred to me for surgical consultation. He is scheduled for surgery with me next week. Patient states that he has had ongoing pain to the left foot for several years. He uses had joint aspirations of the second metatarsal phalangeal joint which would give some relief for period time however the pain would come back. More recently these treatment no longer helped and surgical intervention was discussed. He gets pain to the ball of his foot diffusely to submetatarsal 1-5 of the majority symptoms appear to be localized submetatarsal 2 area. He has evidence other treatments including offloading, shoe changes without any relief of symptoms. He states "something has got to be done, this is so painful". Denies any systemic complaints such as fevers, chills, nausea, vomiting. No acute changes since last appointment, and no other complaints at this time.   Objective: AAO x3, NAD DP/PT pulses palpable bilaterally, CRT less than 3 seconds Diffuse tenderness of submetatarsal area and the left foot 1-5. Majority symptoms appear to be localized to the second metatarsophalangeal joint. There is hammertoe contracture present lesser digits most notably the second toe. There is no other specific area pinpoint bony tenderness or pain the vibratory sensation. The other hammertoes or not some somatic at this time. No open lesions or pre-ulcerative lesions.  No pain with calf compression, swelling, warmth, erythema  Assessment: Capsulitis second metatarsophalangeal joint with hammertoe, submetatarsal to pain  Plan: -All treatment options discussed with the patient including all alternatives, risks, complications.  -Chart and x-rays are reviewed. I discussed  the patient both conservative and surgical treatment options. At this point is requesting surgical intervention to help decrease his pain and deformity. Discussed with him similar surgery as Dr. Paulla Dolly last he was scheduled for this week with a second metatarsal shortening osteotomy and hammertoe repair with K wire fixation. I discussed with him the surgery as well as the postoperative course. He would like to proceed with surgery.  -The incision placement as well as the postoperative course was discussed with the patient. I discussed risks of the surgery which include, but not limited to, infection, bleeding, pain, swelling, need for further surgery, delayed or nonhealing, painful or ugly scar, numbness or sensation changes, over/under correction, recurrence, transfer lesions, further deformity, hardware failure, DVT/PE, loss of toe/foot. Patient understands these risks and wishes to proceed with surgery. The surgical consent was reviewed with the patient all 3 pages were signed. No promises or guarantees were given to the outcome of the procedure. All questions were answered to the best of my ability. Before the surgery the patient was encouraged to call the office if there is any further questions. The surgery will be performed at the [facility] on an outpatient basis. -Discussed this surgery will likely NOT eliminate all of his pain but hopefully make it much better for him. Long term he will likely still need orthotics. He understands this.  -He went ahead and stopped plavix Friday on his own.  -Patient encouraged to call the office with any questions, concerns, change in symptoms.

## 2016-08-09 ENCOUNTER — Encounter: Payer: Self-pay | Admitting: *Deleted

## 2016-08-09 NOTE — Telephone Encounter (Signed)
Patient came in for a consultation with Dr. Jacqualyn Posey on 08/06/2016.

## 2016-08-11 ENCOUNTER — Encounter: Payer: Self-pay | Admitting: Podiatry

## 2016-08-11 DIAGNOSIS — M21542 Acquired clubfoot, left foot: Secondary | ICD-10-CM

## 2016-08-11 DIAGNOSIS — M2042 Other hammer toe(s) (acquired), left foot: Secondary | ICD-10-CM | POA: Diagnosis not present

## 2016-08-13 ENCOUNTER — Telehealth: Payer: Self-pay | Admitting: *Deleted

## 2016-08-13 NOTE — Telephone Encounter (Addendum)
Pt states the pain medication is not helping his pain. Dr. Jacqualyn Posey states pt may increase the Percocet 5/325mg  to 2 tablets at each prescribed time. Left message to call for information concerning pain management. I asked pt to describe the pain and he said it felt like a pressure.  I instructed pt to remove surgery boot, open-ended sock, ace wrap only and elevate the foot 15 minutes, but if the pain worsens elevated to dangle 15 minutes, after the 15 minutes at either position place the foot at hip level and rewrap the ace looser from the foot up the leg, the replace the sock and surgical boot. Pt states he can not take antiinflammatories due to his heart medications. I gave him the instructions from Dr. Jacqualyn Posey to increase the Percocet to 2 tablet.

## 2016-08-16 ENCOUNTER — Ambulatory Visit (INDEPENDENT_AMBULATORY_CARE_PROVIDER_SITE_OTHER): Payer: Managed Care, Other (non HMO) | Admitting: Podiatry

## 2016-08-16 ENCOUNTER — Ambulatory Visit (INDEPENDENT_AMBULATORY_CARE_PROVIDER_SITE_OTHER): Payer: Managed Care, Other (non HMO)

## 2016-08-16 ENCOUNTER — Other Ambulatory Visit: Payer: Self-pay | Admitting: Podiatry

## 2016-08-16 VITALS — BP 122/78 | HR 78

## 2016-08-16 DIAGNOSIS — M779 Enthesopathy, unspecified: Secondary | ICD-10-CM

## 2016-08-16 DIAGNOSIS — Z9889 Other specified postprocedural states: Secondary | ICD-10-CM

## 2016-08-16 DIAGNOSIS — M216X2 Other acquired deformities of left foot: Secondary | ICD-10-CM

## 2016-08-16 MED ORDER — OXYCODONE-ACETAMINOPHEN 10-325 MG PO TABS: 1.0000 | ORAL_TABLET | Freq: Three times a day (TID) | ORAL | 0 refills | Status: DC | PRN

## 2016-08-16 NOTE — Progress Notes (Signed)
Subjective: [name] Brown is a 65 y.o. is seen today in office s/p left foot 2nd metatarsal osteotomy and hammertoe repair preformed on 08/11/16. They state his pain has improved and he has cut back on the pain medication he is taking. He is taking the antibiotics as prescribed. Denies any systemic complaints such as fevers, chills, nausea, vomiting. No calf pain, chest pain, shortness of breath.   Objective: General: No acute distress, AAOx3  DP/PT pulses palpable 2/4, CRT < 3 sec to all digits.  Protective sensation intact. Motor function intact.  Left foot: Incision is well coapted without any evidence of dehiscence and sutures intact. There is faint very localized erythema along the proximal incision but there is no drainage or ascending cellulitis. This erythematous likely from inflammation as opposed to infection.There is no fluctuance, crepitus, malodor, drainage/purulence. There is minimal edema around the surgical site. There is mild pain along the surgical site. K-wire intact.  No other areas of tenderness to bilateral lower extremities.  No other open lesions or pre-ulcerative lesions.  No pain with calf compression, swelling, warmth, erythema.   Assessment and Plan:  Status post left foot surgery, doing well with no complications   -Treatment options discussed including all alternatives, risks, and complications -X-rays were obtained and reviewed with the patient. Hardware intact. No evidence of acute fracture. Refilled Percocet today. -Ice/elevation -Continue CAM boot and limit activity.  -Pain medication as needed. -Monitor for any clinical signs or symptoms of infection and DVT/PE and directed to call the office immediately should any occur or go to the ER. -Follow-up in 10 days for suture removal  or sooner if any problems arise. In the meantime, encouraged to call the office with any questions, concerns, change in symptoms.   *Bioskin next appointment to hold toe in rectus  position.   Celesta Gentile, DPM

## 2016-08-27 ENCOUNTER — Ambulatory Visit (INDEPENDENT_AMBULATORY_CARE_PROVIDER_SITE_OTHER): Payer: Managed Care, Other (non HMO) | Admitting: Podiatry

## 2016-08-27 ENCOUNTER — Ambulatory Visit (INDEPENDENT_AMBULATORY_CARE_PROVIDER_SITE_OTHER): Payer: Managed Care, Other (non HMO)

## 2016-08-27 ENCOUNTER — Encounter: Payer: Self-pay | Admitting: Podiatry

## 2016-08-27 VITALS — BP 150/74 | HR 73 | Resp 16

## 2016-08-27 DIAGNOSIS — Z9889 Other specified postprocedural states: Secondary | ICD-10-CM | POA: Diagnosis not present

## 2016-08-27 DIAGNOSIS — M216X2 Other acquired deformities of left foot: Secondary | ICD-10-CM | POA: Diagnosis not present

## 2016-08-27 DIAGNOSIS — M779 Enthesopathy, unspecified: Secondary | ICD-10-CM

## 2016-08-27 MED ORDER — OXYCODONE-ACETAMINOPHEN 10-325 MG PO TABS: 1.0000 | ORAL_TABLET | Freq: Three times a day (TID) | ORAL | 0 refills | Status: DC | PRN

## 2016-08-30 NOTE — Progress Notes (Signed)
Subjective: TYREL VANDRUFF is a 65 y.o. is seen today in office s/p left foot 2nd metatarsal osteotomy and hammertoe repair preformed on 08/11/16. He states he is doing well and his pain is improved. He has returned to work. He is wearing the cam boot. Denies any systemic complaints such as fevers, chills, nausea, vomiting. No calf pain, chest pain, shortness of breath.   Objective: General: No acute distress, AAOx3  DP/PT pulses palpable 2/4, CRT < 3 sec to all digits.  Protective sensation intact. Motor function intact.  Left foot: Incision is well coapted without any evidence of dehiscence and sutures intact. There is no erythema or ascending cellulitis on the incision. This is no drainage or pus. There is no fluctuance, crepitus, malodor, drainage/purulence. There is minimal edema around the surgical site. There is mild pain along the surgical site. K-wire intact.  No other areas of tenderness to bilateral lower extremities.  No other open lesions or pre-ulcerative lesions.  No pain with calf compression, swelling, warmth, erythema.   Assessment and Plan:  Status post left foot surgery, doing well with no complications   -Treatment options discussed including all alternatives, risks, and complications -Every other suture was removed today. Antibiotic ointment was applied followed by a bandage. Continue this at home as well. -BioSkin was dispensed as a to help hold the toe in a rectus position. -Surgical shoe dispensed. Wear cam boot while at work he can with a surgical shoe other times. -Continue ice and elevation. -Pain medication as needed. Refilled today. -Monitor for any clinical signs or symptoms of infection and DVT/PE and directed to call the office immediately should any occur or go to the ER. -Follow-up in 10 days for suture removal  or sooner if any problems arise. In the meantime, encouraged to call the office with any questions, concerns, change in symptoms.   Celesta Gentile,  DPM

## 2016-09-06 ENCOUNTER — Ambulatory Visit (INDEPENDENT_AMBULATORY_CARE_PROVIDER_SITE_OTHER): Payer: Managed Care, Other (non HMO) | Admitting: Podiatry

## 2016-09-06 ENCOUNTER — Ambulatory Visit (INDEPENDENT_AMBULATORY_CARE_PROVIDER_SITE_OTHER): Payer: Managed Care, Other (non HMO)

## 2016-09-06 DIAGNOSIS — M216X2 Other acquired deformities of left foot: Secondary | ICD-10-CM | POA: Diagnosis not present

## 2016-09-06 DIAGNOSIS — Z9889 Other specified postprocedural states: Secondary | ICD-10-CM

## 2016-09-14 NOTE — Progress Notes (Signed)
Subjective: [name] Brown is a 65 y.o. is seen today in office s/p left foot 2nd metatarsal osteotomy and hammertoe repair preformed on 08/11/16. He presents a for suture removal. He states he is doing well and having no pain. He is also continue with the BioSkin. He's been wearing surgical shoe. Denies any systemic complaints such as fevers, chills, nausea, vomiting. No calf pain, chest pain, shortness of breath.   Objective: General: No acute distress, AAOx3  DP/PT pulses palpable 2/4, CRT < 3 sec to all digits.  Protective sensation intact. Motor function intact.  Left foot: Incision is well coapted without any evidence of dehiscence and sutures intact. There is no erythema or ascending cellulitis around the incision. This is no drainage or pus. There is no fluctuance, crepitus, malodor, drainage/purulence. There is trace edema around the surgical site. There is no significant pain along the surgical site. K-wire intact. Toe is in rectus position. No other areas of tenderness to bilateral lower extremities.  No other open lesions or pre-ulcerative lesions.  No pain with calf compression, swelling, warmth, erythema.   Assessment and Plan:  Status post left foot surgery, doing well with no complications   -Treatment options discussed including all alternatives, risks, and complications -Remainder of the sutures were removed today. Antibiotic on it was applied followed by a bandage. -Continue BioSkin -Ice -Limit activity -Pain medication as needed.  -Continue surgical shoe.  -Monitor for any clinical signs or symptoms of infection and DVT/PE and directed to call the office immediately should any occur or go to the ER. -Follow-up as scheduled or sooner if any problems arise. In the meantime, encouraged to call the office with any questions, concerns, change in symptoms.   Celesta Gentile, DPM

## 2016-09-20 ENCOUNTER — Ambulatory Visit (INDEPENDENT_AMBULATORY_CARE_PROVIDER_SITE_OTHER): Payer: Managed Care, Other (non HMO)

## 2016-09-20 ENCOUNTER — Ambulatory Visit (INDEPENDENT_AMBULATORY_CARE_PROVIDER_SITE_OTHER): Payer: Managed Care, Other (non HMO) | Admitting: Podiatry

## 2016-09-20 DIAGNOSIS — Z9889 Other specified postprocedural states: Secondary | ICD-10-CM

## 2016-09-20 DIAGNOSIS — M216X2 Other acquired deformities of left foot: Secondary | ICD-10-CM

## 2016-09-22 ENCOUNTER — Encounter: Payer: Self-pay | Admitting: Podiatry

## 2016-09-22 ENCOUNTER — Ambulatory Visit (INDEPENDENT_AMBULATORY_CARE_PROVIDER_SITE_OTHER): Payer: Managed Care, Other (non HMO) | Admitting: Podiatry

## 2016-09-22 DIAGNOSIS — B079 Viral wart, unspecified: Secondary | ICD-10-CM

## 2016-09-22 DIAGNOSIS — Z9889 Other specified postprocedural states: Secondary | ICD-10-CM

## 2016-09-23 NOTE — Progress Notes (Signed)
Subjective:     Patient ID: [name] Brown, male   DOB: 15-Dec-1950, 65 y.o.   MRN: [id number]  HPI patient presents stating he's concerned disease developed a callus on the bottom of his foot left and states the surgery itself is doing fine   Review of Systems     Objective:   Physical Exam Neurovascular intact negative Homans sign noted with good alignment of the second digit with mild elevation of the toe that he is working on with keratotic lesion third metatarsal    Assessment:     Lesion which may be due to transfer of weight to the third MPJ or may be due to just dry skin and generalized condition    Plan:     At this time debridement accomplished and he will reappoint for routine visit with Dr. Jacqualyn Posey. Reappoint as needed for this particular problem

## 2016-09-26 NOTE — Progress Notes (Signed)
Subjective: [name] Brown is a 65 y.o. is seen today in office s/p left foot 2nd metatarsal osteotomy and hammertoe repair preformed on 08/11/16. He presents a for pin removal. He states he is doing well and having no pain. He is continuing the surgical shoe. He is also continue with the BioSkin but not on a routine basis. He's been wearing surgical shoe. Denies any systemic complaints such as fevers, chills, nausea, vomiting. No calf pain, chest pain, shortness of breath.   Objective: General: No acute distress, AAOx3  DP/PT pulses palpable 2/4, CRT < 3 sec to all digits.  Protective sensation intact. Motor function intact.  Left foot: Incision is well coapted without any evidence of dehiscence and a scar has formed. There is no erythema or ascending cellulitis around the incision. This is no drainage or pus. There is no fluctuance, crepitus, malodor, drainage/purulence. There is trace edema around the surgical site. There is no pain along the surgical site. K-wire intact. Toe is in rectus position. No other areas of tenderness to bilateral lower extremities.  No other open lesions or pre-ulcerative lesions.  No pain with calf compression, swelling, warmth, erythema.   Assessment and Plan:  Status post left foot surgery, doing well with no complications   -Treatment options discussed including all alternatives, risks, and complications -X-rays were obtained and reviewed. K wire intact. Screw intact the second metatarsal. No evidence of acute fracture identified. -Pin was removed today in total. Antibiotic on it was applied followed by a bandage. He can start to shower tomorrow as long as the pin site has closed. -Continue BioSkin -Ice -Limit activity -Pain medication as needed however he is not taking. -Can start to transition to a regular shoe as tolerated. -Monitor for any clinical signs or symptoms of infection and DVT/PE and directed to call the office immediately should any occur or go  to the ER. -Follow-up as scheduled or sooner if any problems arise. In the meantime, encouraged to call the office with any questions, concerns, change in symptoms.   Celesta Gentile, DPM

## 2016-09-29 NOTE — Progress Notes (Signed)
DOS 11.01.2017 Fusion with Pin 2nd Left; Shortening Osteotomy with Screw 2nd Left

## 2016-10-08 ENCOUNTER — Other Ambulatory Visit: Payer: Self-pay | Admitting: Cardiovascular Disease

## 2016-10-12 ENCOUNTER — Other Ambulatory Visit: Payer: Self-pay | Admitting: Cardiovascular Disease

## 2016-10-18 ENCOUNTER — Encounter: Payer: Self-pay | Admitting: Podiatry

## 2016-10-18 ENCOUNTER — Ambulatory Visit (INDEPENDENT_AMBULATORY_CARE_PROVIDER_SITE_OTHER): Payer: Managed Care, Other (non HMO)

## 2016-10-18 ENCOUNTER — Ambulatory Visit (INDEPENDENT_AMBULATORY_CARE_PROVIDER_SITE_OTHER): Payer: Managed Care, Other (non HMO) | Admitting: Podiatry

## 2016-10-18 ENCOUNTER — Other Ambulatory Visit: Payer: Self-pay | Admitting: Family Medicine

## 2016-10-18 DIAGNOSIS — M2042 Other hammer toe(s) (acquired), left foot: Secondary | ICD-10-CM

## 2016-10-18 DIAGNOSIS — Q828 Other specified congenital malformations of skin: Secondary | ICD-10-CM

## 2016-10-18 NOTE — Telephone Encounter (Signed)
Refill denied. Pt needs an appt. 

## 2016-10-19 NOTE — Progress Notes (Signed)
Subjective: 66 year old male presents the office today for concerns of reoccurring callus to the bottom of the left foot submetatarsal 2. After last appointment with me to follow up with Dr. Paulla Dolly on area was trimmed a history to come back slowly. He states the surgery itself is doing well and the only pain is having is with the callus. He is back at work and he is wearing regular shoe gear without any difficulty. Denies any systemic complaints such as fevers, chills, nausea, vomiting. No acute changes since last appointment, and no other complaints at this time.   Objective: AAO x3, NAD DP/PT pulses palpable bilaterally, CRT less than 3 seconds Incisions are well coapted without any evidence of dehiscence. The second toe is in a rectus position. There is prominence of all metatarsal heads and there is atrophy of the fa tpad. There is a small pinpoint annular hyperkeratotic lesion to the left foot submetatarsal 2. Upon debridement there was no pinpoint bleeding or evidence of verruca. Mild tenderness to palpation of the area prior to debridement. There is no evidence of foreign body. No open lesions or pre-ulcerative lesions.  No pain with calf compression, swelling, warmth, erythema  Assessment: Left foot porokeratosis  Plan: -All treatment options discussed with the patient including all alternatives, risks, complications.  -X-rays were obtained and reviewed with the patient. No evidence of acute fracture. Status post second metatarsal osteotomy and hammertoe repair. -This and the surgery is doing well. Would discharge him from the postoperative care. -Hyperkeratotic lesion was debrided without complications or bleeding. The area was cleaned and a pad was placed followed by salinocaine and an occlusive bandage. Post procedure instructions were discussed. -Offloading pads dispensed -Follow-up as needed.  -Patient encouraged to call the office with any questions, concerns, change in symptoms.    Celesta Gentile, DPM

## 2016-11-14 ENCOUNTER — Other Ambulatory Visit: Payer: Self-pay | Admitting: Cardiovascular Disease

## 2016-11-16 ENCOUNTER — Ambulatory Visit (INDEPENDENT_AMBULATORY_CARE_PROVIDER_SITE_OTHER): Payer: Managed Care, Other (non HMO) | Admitting: Family Medicine

## 2016-11-16 ENCOUNTER — Encounter: Payer: Self-pay | Admitting: Family Medicine

## 2016-11-16 VITALS — BP 136/80 | HR 80 | Ht 70.0 in | Wt 195.0 lb

## 2016-11-16 DIAGNOSIS — M62838 Other muscle spasm: Secondary | ICD-10-CM | POA: Diagnosis not present

## 2016-11-16 DIAGNOSIS — M999 Biomechanical lesion, unspecified: Secondary | ICD-10-CM

## 2016-11-16 MED ORDER — TIZANIDINE HCL 4 MG PO TABS: 4.0000 mg | ORAL_TABLET | Freq: Three times a day (TID) | ORAL | 1 refills | Status: DC | PRN

## 2016-11-16 MED ORDER — GABAPENTIN 100 MG PO CAPS: 200.0000 mg | ORAL_CAPSULE | Freq: Every day | ORAL | 3 refills | Status: DC

## 2016-11-16 NOTE — Progress Notes (Signed)
[name] [address] [place], [place] [address] Phone: [phone] Subjective:    I'm seeing this patient by the request  of:  Hoyt Koch, MD   CC: Left shoulder pain  [id number]  [name] Brown is a 66 y.o. male coming in with complaint of left shoulder pain. 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 States that this feels securely similar. Seems to be on the other side. States that it's relaxer has been helpful. No radiation down the arm, no numbness. Some increasing neck pain. Has had difficulty with his neck previously. Paresis severity of pain a 7 out of 10. May estimated significant worse     Past Medical History:  Diagnosis Date  . Aortic aneurysm ([place]) Endograf 2009  . Bradycardia   . CAD (coronary artery disease) CABG 03/2000   Median sternotomy for coronary artery bypass grafting x 3 (left  . HTN (hypertension)   . Hyperlipidemia   . Myocardial infarction    Past Surgical History:  Procedure Laterality Date  . CORONARY ARTERY BYPASS GRAFT     AAA stent graft on 12/01/2007 at [facility]  coronary bypass in 2001. Dr. Roxy Manns.  Marland Kitchen LEFT HEART CATHETERIZATION WITH CORONARY ANGIOGRAM N/A 10/26/2013   Procedure: LEFT HEART CATHETERIZATION WITH CORONARY ANGIOGRAM;  Surgeon: Peter M Martinique, MD;  Location: [facility] CATH LAB;  Service: Cardiovascular;  Laterality: N/A;   Social History   Social History  . Marital status: Married    Spouse name: N/A  . Number of children: N/A  . Years of education: N/A   Social History Main Topics  . Smoking status: Former Smoker    Packs/day: 2.00    Years: 30.00    Types: Cigarettes    Quit date: 01/05/1992  . Smokeless tobacco: Never Used     Comment: quit 19 yrs ago  . Alcohol use No  . Drug use: No  . Sexual activity: Not Asked   Other Topics Concern  . 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   Allergies  Allergen Reactions  . Statins     Myalgias with elevated CPKs   Family History  Problem Relation Age of Onset  . Heart attack Mother   . Heart disease Mother   . Heart attack Father   . Heart disease Father     Past medical history, social, surgical and family history all reviewed in electronic medical record.  No pertanent information unless stated regarding to the chief complaint.   Review of Systems:Review of systems updated and as accurate as of 11/16/16  No headache, visual changes, nausea, vomiting, diarrhea, constipation, dizziness, abdominal pain, skin rash, fevers, chills, night sweats, weight loss, swollen lymph nodes, , chest pain, shortness of breath, mood changes.   Objective  Blood pressure 136/80, pulse 80, height 5\' 10"  (1.778 m), weight 195 lb (88.5 kg), SpO2 98 %. Systems examined below as of 11/16/16   General: No apparent distress alert and oriented x3 mood and affect normal, dressed appropriately.  HEENT: Pupils equal, extraocular movements intact  Respiratory: Patient's speak in full sentences and does not appear short of breath  Cardiovascular: No lower extremity edema, non tender, no erythema  Skin: Warm dry intact with no signs of infection or rash on extremities or on axial skeleton.  Abdomen: Soft nontender  Neuro: Cranial nerves II through XII are intact, neurovascularly intact in all extremities with 2+ DTRs and 2+ pulses.  Lymph: No lymphadenopathy of posterior or anterior cervical chain or axillae bilaterally.  Gait normal with good balance and coordination.  MSK:  Non tender with full range of motion and good stability and symmetric strength and tone of shoulders, elbows, wrist,  knee and ankles bilaterally.  Neck: Inspection unremarkable. No palpable stepoffs. Positive Spurling's maneuver with mild radicular symptoms going down the C5 patient on the right. Decreased range of motion side bending  bilaterally. Grip strength and sensation normal in bilateral hands Strength good C4 to T1 distribution No sensory change to C4 to T1 Negative Hoffman sign bilaterally Reflexes normal Does have some pain over the third rib.  Osteopathic findings Cervical C6 flexed rotated and side bent left T3 extended rotated and side bent right inhaled third rib T9 extended rotated and side bent left    Impression and Recommendations:     This case required medical decision making of moderate complexity.      Note: This dictation was prepared with Dragon dictation along with smaller phrase technology. Any transcriptional errors that result from this process are unintentional.

## 2016-11-16 NOTE — Patient Instructions (Signed)
Good to see you.  Keep working on the posture Get back in the pool Ice 20 minutes 2 times daily. Usually after activity and before bed. Gabapentin 200mg  at night Zanaflex up to 2 times a day if you really need it.  If not better see me again in 2 weeks. Otherwise see me when you need me.

## 2016-11-16 NOTE — Assessment & Plan Note (Signed)
None muscle strain noted with likely a slipped rib. Has responded well to osteopathic manipulation in the past and did again. Refill of muscle relaxer given. Patient also given low dose gabapentin in case there is any cervical radiculopathy is a possibility. Follow-up again in 3-4 weeks.

## 2016-11-16 NOTE — Assessment & Plan Note (Signed)
Decision today to treat with OMT was based on Physical Exam  After verbal consent patient was treated with HVLA, ME, FPR techniques in cervical, thoracic, rib lumbar and sacral areas  Patient tolerated the procedure well with improvement in symptoms  Patient given exercises, stretches and lifestyle modifications  See medications in patient instructions if given  Patient will follow up in 3 weeks 

## 2016-11-17 ENCOUNTER — Other Ambulatory Visit: Payer: Self-pay | Admitting: Internal Medicine

## 2016-11-25 NOTE — Progress Notes (Signed)
Patient ID: [name] Brown, male   DOB: 1951-07-14, 66 y.o.   MRN: [id number]  The pt is a 66 y.o.  male w/ PMHx significant for CAD s/p CABG x 3 ( LIMA to LAD, RIMA to PDA, L radial to OM1). He is s/p stent in 2008, s/p EVAR 2009 for AAA,  Presented to [facility] 1/15 with chest pain. Cath by Dr Martinique showed stable disease for medical Rx   Cath 10/25/13 Martinique Films reviewed  Coronary angiography:  Coronary dominance: right  Left mainstem: Heavily calcified and tortuous. 40% distal stenosis.  Left anterior descending (LAD): Very complex proximal disease up to 95%. Heavy calcification. The LAD is occluded after the first diagonal and first septal perforator. The first diagonal is a moderate sized vessel with 40% disease.  Left circumflex (LCx): The left circumflex is calcified. There is 30-40% disease in the mid vessel. The first OM appears small and is occluded. The second OM is tiny. The terminal OM has segmental 50-60% disease.  Right coronary artery (RCA): 100% proximal occlusion.  The free radial graft to the OM is 100% occluded at the ostium.  The RIMA graft to the RCA is a large graft and widely patent.  The LIMA graft to the LAD is a large graft and is widely patent. It gives collaterals to an RV marginal branch.  Left ventriculography: Left ventricular systolic function is normal, LVEF is estimated at 50-55%, There is hypokinesis of the mid anterior wall, there is no significant mitral regurgitation   Final Conclusions:  1. Severe 3 vessel occlusive CAD  2. Patent LIMA to the LAD  3. Patent RIMA to the RCA  4. Occluded free radial graft to the OM  5. Good LV function.  Recommendations: Medical management.   No longer taking Ranexa  Verapamil d/c due to some AV block . Seen by [facility] and started on depakote for cluster headaches     01/30/15 Duplex reviewed Left ICA 60-79% stenosis.  No TIA symptoms.  Continue antiplatelet Rx and F/U carotid duplex in October   11/2014  EVAR [place] per  Dr Scot Dock 2.8 cm sack size stable   No chest pain wanted viagra and told him he should not take this   ROS: Denies fever, malais, weight loss, blurry vision, decreased visual acuity, cough, sputum, SOB, hemoptysis, pleuritic pain, palpitaitons, heartburn, abdominal pain, melena, lower extremity edema, claudication, or rash.  All other systems reviewed and negative  General: Affect appropriate Healthy:  appears stated age 55: normal poor dentition  Neck supple with no adenopathy JVP normal bilateral bruits no thyromegaly Lungs clear with no wheezing and good diaphragmatic motion Heart:  S1/S2 SEM murmur, no rub, gallop or click PMI normal Abdomen: benighn, BS positve, no tenderness, no AAA no bruit.  No HSM or HJR Distal pulses intact with no bruits left radial harvested  No edema Neuro non-focal Skin warm and dry No muscular weakness   Current Outpatient Prescriptions  Medication Sig Dispense Refill  . aspirin EC 81 MG EC tablet Take 1 tablet (81 mg total) by mouth daily.    . cephALEXin (KEFLEX) 500 MG capsule Take 500 mg by mouth 3 (three) times daily.    . Cholecalciferol (VITAMIN D3 PO) Take 1 tablet by mouth daily.    . clopidogrel (PLAVIX) 75 MG tablet TAKE 1 TABLET BY MOUTH DAILY 90 tablet 3  . diclofenac sodium (VOLTAREN) 1 % GEL Apply 2 g topically 4 (four) times daily as needed (pain).    Marland Kitchen  divalproex (DEPAKOTE ER) 500 MG 24 hr tablet Take 1 tablet (500 mg total) by mouth daily. 30 tablet 11  . doxycycline (VIBRA-TABS) 100 MG tablet Take 50 mg by mouth daily.     Marland Kitchen ezetimibe (ZETIA) 10 MG tablet TAKE 1 TABLET BY MOUTH DAILY 90 tablet 0  . ferrous fumarate (HEMOCYTE - 106 MG FE) 325 (106 FE) MG TABS tablet Take 1 tablet by mouth daily.    . fish oil-omega-3 fatty acids 1000 MG capsule Take 1,200 mg by mouth 2 (two) times daily.     Marland Kitchen gabapentin (NEURONTIN) 100 MG capsule Take 2 capsules (200 mg total) by mouth at bedtime. 60 capsule 3  . Glucosamine-Chondroitin  (GLUCOSAMINE CHONDR COMPLEX PO) Take 500 mg by mouth 2 (two) times daily.     . hydrALAZINE (APRESOLINE) 50 MG tablet Take 1 tablet (50 mg total) by mouth 2 (two) times daily. 180 tablet 3  . hydrochlorothiazide (MICROZIDE) 12.5 MG capsule TAKE 1 CAPSULE(12.5 MG) BY MOUTH DAILY 90 capsule 0  . isosorbide mononitrate (IMDUR) 30 MG 24 hr tablet TAKE 1 TABLET BY MOUTH EVERY DAY 90 tablet 0  . Multiple Vitamin (MULTIVITAMIN) capsule Take 1 capsule by mouth daily.    . niacin (NIASPAN) 1000 MG CR tablet Take 1,000 mg by mouth 2 (two) times daily.      . nitroGLYCERIN (NITROSTAT) 0.4 MG SL tablet PLACE 1 TABLET UNDER TONGUE EVERY 5 MINUTES FOR 3 DOSES AS NEEDED FOR CHEST PAIN 25 tablet 1  . oxyCODONE-acetaminophen (PERCOCET) 10-325 MG tablet Take 1 tablet by mouth every 8 (eight) hours as needed for pain. 20 tablet 0  . oxyCODONE-acetaminophen (PERCOCET/ROXICET) 5-325 MG tablet Take 1-2 tablets by mouth every 4 (four) hours as needed for severe pain.    . potassium chloride SA (K-DUR,KLOR-CON) 20 MEQ tablet Take 1 tablet (20 mEq total) by mouth daily. Please call and schedule follow up appt for further refills [phone] 90 tablet 0  . promethazine (PHENERGAN) 25 MG tablet Take 25 mg by mouth every 8 (eight) hours as needed for nausea or vomiting.    . ramipril (ALTACE) 10 MG capsule TAKE 1 CAPSULE BY MOUTH TWICE DAILY 60 capsule 8  . tamsulosin (FLOMAX) 0.4 MG CAPS capsule TAKE 1 CAPSULE(0.4 MG) BY MOUTH DAILY 30 capsule 0  . tiZANidine (ZANAFLEX) 4 MG tablet Take 1 tablet (4 mg total) by mouth every 8 (eight) hours as needed for muscle spasms. 90 tablet 1  . vitamin C (ASCORBIC ACID) 500 MG tablet Take 500 mg by mouth daily.     No current facility-administered medications for this visit.     Allergies  Statins  Electrocardiogram:  07/17/14  SR possible old IMI no change from 2014   10/15/14 SR rate 59 PR 224  LAFB   Assessment and Plan CAD: Stable with no angina and good activity level.   Continue medical Rx  AAA:  Post EVAR f/u Scot Dock residual lumen 2.8 cm  Bruit: Left ICA 60-79% stenosis. Duplex 02/11/16   No TIA symptoms.  Continue antiplatelet Rx and F/U carotid duplex in May 2019 at his request due to cost   HTN: Well controlled. Hydralazine helps  Continue current medications and low sodium Dash type diet.    Chol:   Cholesterol is at goal.  Continue current dose of statin and diet Rx.  No myalgias or side effects.  F/U  LFT's in 6 months. Lab Results  Component Value Date   LDLCALC 100 (H) 07/22/2016  Labs  with primary   Urology:  Urinary frequency at night better on flomax f/u primary          F/U with me 6 months    Jenkins Rouge

## 2016-11-30 ENCOUNTER — Ambulatory Visit: Payer: Managed Care, Other (non HMO) | Admitting: Family Medicine

## 2016-12-03 ENCOUNTER — Encounter: Payer: Self-pay | Admitting: Cardiovascular Disease

## 2016-12-03 ENCOUNTER — Ambulatory Visit (INDEPENDENT_AMBULATORY_CARE_PROVIDER_SITE_OTHER): Payer: Managed Care, Other (non HMO) | Admitting: Cardiovascular Disease

## 2016-12-03 VITALS — BP 126/56 | HR 58 | Ht 70.0 in | Wt 192.4 lb

## 2016-12-03 DIAGNOSIS — I2583 Coronary atherosclerosis due to lipid rich plaque: Secondary | ICD-10-CM

## 2016-12-03 DIAGNOSIS — I2510 Atherosclerotic heart disease of native coronary artery without angina pectoris: Secondary | ICD-10-CM | POA: Diagnosis not present

## 2016-12-03 NOTE — Patient Instructions (Signed)

## 2016-12-14 ENCOUNTER — Other Ambulatory Visit: Payer: Self-pay | Admitting: Internal Medicine

## 2017-01-03 ENCOUNTER — Other Ambulatory Visit: Payer: Self-pay | Admitting: Internal Medicine

## 2017-01-03 ENCOUNTER — Telehealth: Payer: Self-pay

## 2017-01-03 DIAGNOSIS — G4733 Obstructive sleep apnea (adult) (pediatric): Secondary | ICD-10-CM

## 2017-01-03 NOTE — Telephone Encounter (Signed)
LVM for patient to call back for results of sleep test

## 2017-01-05 ENCOUNTER — Other Ambulatory Visit: Payer: Self-pay | Admitting: Cardiovascular Disease

## 2017-01-11 ENCOUNTER — Other Ambulatory Visit: Payer: Self-pay | Admitting: Cardiovascular Disease

## 2017-01-11 ENCOUNTER — Other Ambulatory Visit: Payer: Self-pay | Admitting: Internal Medicine

## 2017-01-13 ENCOUNTER — Telehealth: Payer: Self-pay | Admitting: Internal Medicine

## 2017-01-13 NOTE — Telephone Encounter (Signed)
LVM for patient to call back. ?

## 2017-01-13 NOTE — Telephone Encounter (Signed)
Pt called checking on C-PAP machine. He said that he spoke with you last week but has not heard anything back about it. Please advise. Thanks [employer]

## 2017-01-27 NOTE — Telephone Encounter (Signed)
Patient is requesting call back in regard to order.  States he got a call from [employer] that orders were being processed over a week ago and has not heard anything back. Patient does not know how to call them back.  I will look a number up to give him.  Please follow up with patient in regard at [phone].

## 2017-02-01 NOTE — Telephone Encounter (Signed)
Patient contacted and stated he was able to contact [employer] and everything is being handled

## 2017-02-07 ENCOUNTER — Other Ambulatory Visit: Payer: Self-pay | Admitting: Neurology

## 2017-02-09 ENCOUNTER — Ambulatory Visit (INDEPENDENT_AMBULATORY_CARE_PROVIDER_SITE_OTHER): Payer: Managed Care, Other (non HMO) | Admitting: Podiatry

## 2017-02-09 ENCOUNTER — Encounter: Payer: Self-pay | Admitting: Podiatry

## 2017-02-09 DIAGNOSIS — Q828 Other specified congenital malformations of skin: Secondary | ICD-10-CM | POA: Diagnosis not present

## 2017-02-10 NOTE — Progress Notes (Signed)
Subjective:    Patient ID: [name] Brown, male   DOB: 66 y.o.   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

## 2017-02-12 ENCOUNTER — Other Ambulatory Visit: Payer: Self-pay | Admitting: Cardiovascular Disease

## 2017-02-12 ENCOUNTER — Other Ambulatory Visit: Payer: Self-pay | Admitting: Internal Medicine

## 2017-03-08 ENCOUNTER — Other Ambulatory Visit: Payer: Self-pay | Admitting: Cardiovascular Disease

## 2017-03-08 ENCOUNTER — Other Ambulatory Visit: Payer: Self-pay | Admitting: Neurology

## 2017-03-15 ENCOUNTER — Other Ambulatory Visit: Payer: Self-pay | Admitting: Internal Medicine

## 2017-03-18 ENCOUNTER — Encounter: Payer: Self-pay | Admitting: Podiatry

## 2017-03-18 ENCOUNTER — Ambulatory Visit (INDEPENDENT_AMBULATORY_CARE_PROVIDER_SITE_OTHER): Payer: Managed Care, Other (non HMO)

## 2017-03-18 ENCOUNTER — Ambulatory Visit (INDEPENDENT_AMBULATORY_CARE_PROVIDER_SITE_OTHER): Payer: Managed Care, Other (non HMO) | Admitting: Podiatry

## 2017-03-18 DIAGNOSIS — M216X2 Other acquired deformities of left foot: Secondary | ICD-10-CM

## 2017-03-18 DIAGNOSIS — Q828 Other specified congenital malformations of skin: Secondary | ICD-10-CM

## 2017-03-18 NOTE — Progress Notes (Signed)
Subjective:    Patient ID: [name] Brown, male   DOB: 66 y.o.   MRN: [id number]   HPI patient presents stating this lesion is back again and is increasingly tender    ROS      Objective:  Physical Exam neurovascular status intact with very quick reoccurrence of lesion sub-left foot that appears to be on the third metatarsal bone     Assessment:    Chronic lesion formation sub-left metatarsal that appears to be third metatarsal with previous shortening osteotomy second metatarsal left     Plan:    X-ray with marker reviewed and today I debrided lesion and we are going to make the patient a very soft type orthotic to try to offload this area. I explained it's possible this will require shortening osteotomy of the left third metatarsal  X-ray indicates that the prominent bone is in place third metatarsal left foot

## 2017-04-06 ENCOUNTER — Other Ambulatory Visit: Payer: Self-pay | Admitting: Neurology

## 2017-04-06 ENCOUNTER — Other Ambulatory Visit: Payer: Self-pay | Admitting: Cardiovascular Disease

## 2017-04-07 ENCOUNTER — Other Ambulatory Visit: Payer: Self-pay | Admitting: Cardiovascular Disease

## 2017-04-07 NOTE — Telephone Encounter (Signed)
Please advise on refill request. Thanks, MI 

## 2017-04-08 ENCOUNTER — Telehealth: Payer: Self-pay | Admitting: Podiatry

## 2017-04-08 NOTE — Telephone Encounter (Signed)
lvm for pt to call to schedule an appt to see Liliane Channel to pick up orthotics.

## 2017-04-11 ENCOUNTER — Other Ambulatory Visit (INDEPENDENT_AMBULATORY_CARE_PROVIDER_SITE_OTHER): Payer: Managed Care, Other (non HMO)

## 2017-04-11 ENCOUNTER — Encounter: Payer: Self-pay | Admitting: Family Medicine

## 2017-04-11 ENCOUNTER — Ambulatory Visit (INDEPENDENT_AMBULATORY_CARE_PROVIDER_SITE_OTHER): Payer: Managed Care, Other (non HMO) | Admitting: Family Medicine

## 2017-04-11 VITALS — BP 150/80 | HR 80 | Temp 97.8°F | Resp 14 | Ht 70.0 in | Wt 190.0 lb

## 2017-04-11 DIAGNOSIS — G44009 Cluster headache syndrome, unspecified, not intractable: Secondary | ICD-10-CM | POA: Diagnosis not present

## 2017-04-11 DIAGNOSIS — G4700 Insomnia, unspecified: Secondary | ICD-10-CM | POA: Diagnosis not present

## 2017-04-11 LAB — COMPREHENSIVE METABOLIC PANEL
ALT: 19 U/L (ref 0–53)
AST: 19 U/L (ref 0–37)
Albumin: 4.5 g/dL (ref 3.5–5.2)
Alkaline Phosphatase: 43 U/L (ref 39–117)
BUN: 18 mg/dL (ref 6–23)
CHLORIDE: 100 meq/L (ref 96–112)
CO2: 29 mEq/L (ref 19–32)
Calcium: 10.1 mg/dL (ref 8.4–10.5)
Creatinine, Ser: 1 mg/dL (ref 0.40–1.50)
GFR: 79.62 mL/min (ref >60.00)
GLUCOSE: 96 mg/dL (ref 70–99)
Potassium: 4.3 mEq/L (ref 3.5–5.1)
SODIUM: 138 meq/L (ref 135–145)
Total Bilirubin: 0.6 mg/dL (ref 0.2–1.2)
Total Protein: 7.1 g/dL (ref 6.0–8.3)

## 2017-04-11 MED ORDER — DIVALPROEX SODIUM ER 500 MG PO TB24: 500.0000 mg | ORAL_TABLET | Freq: Every day | ORAL | 1 refills | Status: DC

## 2017-04-11 MED ORDER — TRAZODONE HCL 50 MG PO TABS: 25.0000 mg | ORAL_TABLET | Freq: Every evening | ORAL | 3 refills | Status: DC | PRN

## 2017-04-11 NOTE — Progress Notes (Signed)
Subjective:    Patient ID: [name] Brown, male    DOB: 03/05/1951, 66 y.o.   MRN: [id number]  HPI This is a 66 yo male who presents today requesting that we fill Depakote here so he doesn't have to go to neurology. Has seen Dr. Tomi Likens in past who, according to office note, is fine with med being filled by PCP. Good control of headaches with Depakote ER.   He had sleep study and was not able to get his equipment, the company changed. He eventually got his equipment which did not seem to work. He added an additional 2 pillow with improvement on his sleep. He is not snoring anymore according to his wife.He has difficulty sleeping and requests something for sleep. Has tried multiple over the counter sleep aids without improvement.   Past Medical History:  Diagnosis Date  . Aortic aneurysm ([place]) Endograf 2009  . Bradycardia   . CAD (coronary artery disease) CABG 03/2000   Median sternotomy for coronary artery bypass grafting x 3 (left  . HTN (hypertension)   . Hyperlipidemia   . Myocardial infarction [facility])    Past Surgical History:  Procedure Laterality Date  . CORONARY ARTERY BYPASS GRAFT     AAA stent graft on 12/01/2007 at [facility]  coronary bypass in 2001. Dr. Roxy Manns.  Marland Kitchen LEFT HEART CATHETERIZATION WITH CORONARY ANGIOGRAM N/A 10/26/2013   Procedure: LEFT HEART CATHETERIZATION WITH CORONARY ANGIOGRAM;  Surgeon: Peter M Martinique, MD;  Location: [facility] CATH LAB;  Service: Cardiovascular;  Laterality: N/A;   Family History  Problem Relation Age of Onset  . Heart attack Mother   . Heart disease Mother   . Heart attack Father   . Heart disease Father    Social History  Substance Use Topics  . Smoking status: Former Smoker    Packs/day: 2.00    Years: 30.00    Types: Cigarettes    Quit date: 01/05/1992  . Smokeless tobacco: Never Used     Comment: quit 19 yrs ago  . Alcohol use No      Review of Systems Per HPI    Objective:   Physical Exam Physical Exam    Vitals reviewed. Constitutional: Oriented to person, place, and time. Appears well-developed and well-nourished.  HENT:  Head: Normocephalic and atraumatic.  Eyes: Conjunctivae are normal.  Cardiovascular: Normal rate.   Pulmonary/Chest: Effort normal.  Musculoskeletal: Normal range of motion.  Neurological: Alert and oriented to person, place, and time.  Psychiatric: Normal mood and affect. Behavior is normal. Judgment and thought content normal.    BP (!) 150/80 (BP Location: Left Arm, Patient Position: Sitting, Cuff Size: Normal)   Pulse 80   Temp 97.8 F (36.6 C) (Oral)   Resp 14   Ht 5\' 10"  (1.778 m)   Wt 190 lb (86.2 kg)   SpO2 98%   BMI 27.26 kg/m  Wt Readings from Last 3 Encounters:  04/11/17 190 lb (86.2 kg)  12/03/16 192 lb 6.4 oz (87.3 kg)  11/16/16 195 lb (88.5 kg)   BP Readings from Last 3 Encounters:  04/11/17 (!) 150/80  12/03/16 (!) 126/56  11/16/16 136/80       Assessment & Plan:  1. Insomnia, unspecified type - traZODone (DESYREL) 50 MG tablet; Take 0.5-1 tablets (25-50 mg total) by mouth at bedtime as needed for sleep.  Dispense: 30 tablet; Refill: 3 - encouraged good sleep hygiene  2. Migraine-cluster headache syndrome - divalproex (DEPAKOTE ER) 500 MG 24  hr tablet; Take 1 tablet (500 mg total) by mouth daily.  Dispense: 90 tablet; Refill: 1 - Comprehensive metabolic panel; Future  - follow up in 6 months  Clarene Reamer, FNP-BC  [facility]  04/11/2017 12:09 PM

## 2017-04-11 NOTE — Telephone Encounter (Signed)
This patient has controlled cluster headaches and doing well on Depakote.  When I last saw him over a year ago, I thought no follow up was needed because he has been stable and that further refills could be managed by his PCP.  Are you not comfortable prescribing the Depakote?

## 2017-04-12 ENCOUNTER — Other Ambulatory Visit: Payer: Self-pay | Admitting: Internal Medicine

## 2017-04-15 ENCOUNTER — Other Ambulatory Visit: Payer: Self-pay | Admitting: Cardiovascular Disease

## 2017-04-15 MED ORDER — EZETIMIBE 10 MG PO TABS: 10.0000 mg | ORAL_TABLET | Freq: Every day | ORAL | 1 refills | Status: DC

## 2017-04-20 ENCOUNTER — Ambulatory Visit: Payer: Managed Care, Other (non HMO) | Admitting: *Deleted

## 2017-04-20 DIAGNOSIS — M779 Enthesopathy, unspecified: Secondary | ICD-10-CM

## 2017-04-20 NOTE — Patient Instructions (Signed)

## 2017-04-20 NOTE — Progress Notes (Signed)
Patient ID: [name] Brown, male   DOB: Dec 05, 1950, 66 y.o.   MRN: [id number]   Patient presents for orthotic pick up.  Verbal and written break in and wear instructions given.  Patient will follow up in 4 weeks if symptoms worsen or fail to improve.

## 2017-05-01 ENCOUNTER — Other Ambulatory Visit: Payer: Self-pay | Admitting: Cardiovascular Disease

## 2017-05-05 ENCOUNTER — Ambulatory Visit (INDEPENDENT_AMBULATORY_CARE_PROVIDER_SITE_OTHER): Payer: Managed Care, Other (non HMO) | Admitting: Podiatry

## 2017-05-05 DIAGNOSIS — M216X2 Other acquired deformities of left foot: Secondary | ICD-10-CM

## 2017-05-05 DIAGNOSIS — Q828 Other specified congenital malformations of skin: Secondary | ICD-10-CM | POA: Diagnosis not present

## 2017-05-06 NOTE — Progress Notes (Signed)
Subjective:    Patient ID: [name] Brown, male   DOB: 66 y.o.   MRN: [id number]   HPI patient states this one spot is still really bothering me and the orthotics seem to help with the lesion is still there    ROS      Objective:  Physical Exam neurovascular status intact with significant discomfort third metatarsal head left with keratotic lesion formation with excellent healing of previous surgical site second metatarsal left     Assessment:    Prominent metatarsal third metatarsal left with keratotic tissue formation     Plan:   Educated him on possibility for elevating osteotomy third metatarsal with debridement accomplished today with continued orthotic usage and we'll reevaluate again in approximately one month

## 2017-05-09 ENCOUNTER — Other Ambulatory Visit: Payer: Self-pay | Admitting: Family Medicine

## 2017-05-09 NOTE — Telephone Encounter (Signed)
Refill done.  

## 2017-06-03 ENCOUNTER — Other Ambulatory Visit: Payer: Self-pay | Admitting: Cardiovascular Disease

## 2017-06-14 ENCOUNTER — Other Ambulatory Visit: Payer: Self-pay | Admitting: Family

## 2017-06-16 ENCOUNTER — Encounter: Payer: Self-pay | Admitting: Podiatry

## 2017-06-16 ENCOUNTER — Ambulatory Visit (INDEPENDENT_AMBULATORY_CARE_PROVIDER_SITE_OTHER): Payer: Managed Care, Other (non HMO) | Admitting: Podiatry

## 2017-06-16 DIAGNOSIS — Q828 Other specified congenital malformations of skin: Secondary | ICD-10-CM | POA: Diagnosis not present

## 2017-06-16 DIAGNOSIS — M216X2 Other acquired deformities of left foot: Secondary | ICD-10-CM | POA: Diagnosis not present

## 2017-06-16 NOTE — Patient Instructions (Signed)
Pre-Operative Instructions  Congratulations, you have decided to take an important step towards improving your quality of life.  You can be assured that the doctors and staff at [facility] will be with you every step of the way.  Here are some important things you should know:  1. Plan to be at the surgery center/hospital at least 1 (one) hour prior to your scheduled time, unless otherwise directed by the surgical center/hospital staff.  You must have a responsible adult accompany you, remain during the surgery and drive you home.  Make sure you have directions to the surgical center/hospital to ensure you arrive on time. 2. If you are having surgery at [facility] or [facility] hospitals, you will need a copy of your medical history and physical form from your family physician within one month prior to the date of surgery. We will give you a form for your primary physician to complete.  3. We make every effort to accommodate the date you request for surgery.  However, there are times where surgery dates or times have to be moved.  We will contact you as soon as possible if a change in schedule is required.   4. No aspirin/ibuprofen for one week before surgery.  If you are on aspirin, any non-steroidal anti-inflammatory medications (Mobic, Aleve, Ibuprofen) should not be taken seven (7) days prior to your surgery.  You make take Tylenol for pain prior to surgery.  5. Medications - If you are taking daily heart and blood pressure medications, seizure, reflux, allergy, asthma, anxiety, pain or diabetes medications, make sure you notify the surgery center/hospital before the day of surgery so they can tell you which medications you should take or avoid the day of surgery. 6. No food or drink after midnight the night before surgery unless directed otherwise by surgical center/hospital staff. 7. No alcoholic beverages 24-hours prior to surgery.  No smoking 24-hours prior or 24-hours after  surgery. 8. Wear loose pants or shorts. They should be loose enough to fit over bandages, boots, and casts. 9. Don't wear slip-on shoes. Sneakers are preferred. 10. Bring your boot with you to the surgery center/hospital.  Also bring crutches or a walker if your physician has prescribed it for you.  If you do not have this equipment, it will be provided for you after surgery. 11. If you have not been contacted by the surgery center/hospital by the day before your surgery, call to confirm the date and time of your surgery. 12. Leave-time from work may vary depending on the type of surgery you have.  Appropriate arrangements should be made prior to surgery with your employer. 13. Prescriptions will be provided immediately following surgery by your doctor.  Fill these as soon as possible after surgery and take the medication as directed. Pain medications will not be refilled on weekends and must be approved by the doctor. 14. Remove nail polish on the operative foot and avoid getting pedicures prior to surgery. 15. Wash the night before surgery.  The night before surgery wash the foot and leg well with water and the antibacterial soap provided. Be sure to pay special attention to beneath the toenails and in between the toes.  Wash for at least three (3) minutes. Rinse thoroughly with water and dry well with a towel.  Perform this wash unless told not to do so by your physician.  Enclosed: 1 Ice pack (please put in freezer the night before surgery)   1 Hibiclens skin cleaner     Pre-op instructions  If you have any questions regarding the instructions, please do not hesitate to call our office.  [place]: [address], [place], [place] [address] -- [phone]  [place]: [address] [place], [place] [address] -- [phone]  [place]: [address]  [place], [place] [address] -- [phone]  [place]: [address], [address], [place], [place] [address] -- [phone]  Website:  [email or web address] 

## 2017-06-22 NOTE — Progress Notes (Signed)
Subjective:    Patient ID: [name] Brown, male   DOB: 66 y.o.   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 At this time I recommended surgical intervention and I reviewed with patient surgical intervention for this problem with elevating osteotomy and excision of plantar lesion with wide excision technique. I explained that there is no guarantee this will solve the problem and possibility existed transferred to the next metatarsal and patient and caregiver understands this completely and was allowed to read consent form going over alternative treatments complications. Patient wants surgery and signs consent form after extensive review and is scheduled for outpatient elevating osteotomy and lesion excision with wide technique

## 2017-07-13 ENCOUNTER — Other Ambulatory Visit: Payer: Self-pay | Admitting: Family

## 2017-07-19 ENCOUNTER — Encounter: Payer: Self-pay | Admitting: Podiatry

## 2017-07-19 DIAGNOSIS — D492 Neoplasm of unspecified behavior of bone, soft tissue, and skin: Secondary | ICD-10-CM | POA: Diagnosis not present

## 2017-07-19 DIAGNOSIS — M21542 Acquired clubfoot, left foot: Secondary | ICD-10-CM | POA: Diagnosis not present

## 2017-07-19 HISTORY — PX: FOOT SURGERY: SHX648

## 2017-07-21 ENCOUNTER — Telehealth: Payer: Self-pay | Admitting: *Deleted

## 2017-07-21 MED ORDER — OXYCODONE-ACETAMINOPHEN 10-325 MG PO TABS: 1.0000 | ORAL_TABLET | ORAL | 0 refills | Status: DC | PRN

## 2017-07-21 NOTE — Telephone Encounter (Signed)
Pt states he had surgery 07/19/2017 with Dr. Paulla Dolly and is afraid he will run out of the pain medication before the next week. Dr. Paulla Dolly ordered Percocet 10/325mg  #25 one tablet every 4 hours prn foot pain. I told pt he would be able to get the pain medication filled on 07/23/2017, but could pick up today or tomorrow. Pt states he has about 12 left and just doesn't want to run out.

## 2017-07-26 NOTE — Progress Notes (Signed)
DOS 07/19/17 Metatarsal Osteotomy 3rd LT; Excision Benign Lesion over 4.0 cm LT

## 2017-07-27 ENCOUNTER — Encounter: Payer: Self-pay | Admitting: Podiatry

## 2017-07-27 ENCOUNTER — Ambulatory Visit (INDEPENDENT_AMBULATORY_CARE_PROVIDER_SITE_OTHER): Payer: Self-pay | Admitting: Podiatry

## 2017-07-27 ENCOUNTER — Ambulatory Visit (INDEPENDENT_AMBULATORY_CARE_PROVIDER_SITE_OTHER): Payer: Managed Care, Other (non HMO)

## 2017-07-27 VITALS — BP 128/75 | HR 71 | Temp 96.5°F

## 2017-07-27 DIAGNOSIS — M2042 Other hammer toe(s) (acquired), left foot: Secondary | ICD-10-CM

## 2017-07-27 DIAGNOSIS — Z9889 Other specified postprocedural states: Secondary | ICD-10-CM

## 2017-07-31 NOTE — Progress Notes (Signed)
   Subjective:  Patient presents today status post left foot surgery. DOS: 07/19/17. He states he is doing well. He has no new complaints at this time. He is here for further evaluation and treatment.    Past Medical History:  Diagnosis Date  . Aortic aneurysm ([place]) Endograf 2009  . Bradycardia   . CAD (coronary artery disease) CABG 03/2000   Median sternotomy for coronary artery bypass grafting x 3 (left  . HTN (hypertension)   . Hyperlipidemia   . Myocardial infarction [facility])       Objective/Physical Exam Skin incisions appear to be well coapted with sutures and staples intact. No sign of infectious process noted. No dehiscence. No active bleeding noted. Moderate edema noted to the surgical extremity.  Radiographic Exam:  Orthopedic hardware and osteotomies sites appear to be stable with routine healing.  Assessment: 1. s/p Left foot surgery. DOS: 07/19/17.   Plan of Care:  1. Patient was evaluated. X-rays reviewed 2. Dressing change. 3. Continue weightbearing in postop shoe. Discontinue wearing cam boot. 4. Return to clinic in 1 week with Dr. Paulla Dolly.   Edrick Kins, DPM [facility]  Dr. Edrick Kins, [address]                                        [place], [place] [address]                Office [phone]  Fax [phone]

## 2017-08-03 ENCOUNTER — Ambulatory Visit (INDEPENDENT_AMBULATORY_CARE_PROVIDER_SITE_OTHER): Payer: Managed Care, Other (non HMO) | Admitting: Podiatry

## 2017-08-03 DIAGNOSIS — M2042 Other hammer toe(s) (acquired), left foot: Secondary | ICD-10-CM | POA: Diagnosis not present

## 2017-08-03 DIAGNOSIS — M216X2 Other acquired deformities of left foot: Secondary | ICD-10-CM

## 2017-08-05 NOTE — Progress Notes (Signed)
Subjective:    Patient ID: [name] Brown, male   DOB: 66 y.o.   MRN: [id number]   HPI patient states doing well with surgery with minimal discomfort and able to walk without significant pain    ROS      Objective:  Physical Exam neurovascular status intact with patient's wound edges left healing well both dorsal plantar with stitches intact negative Homans sign noted and reduced pressure against the metatarsal head     Assessment:    Doing well post osteotomy third metatarsal left with plantar skin excision     Plan:   Reviewed x-rays and advised on continued elevation compression and reapplied sterile dressing. Reappoint for [place] to recheck again next few weeks  X-rays indicate the osteotomy is healing well screw in place with plantar incision healing well

## 2017-08-10 ENCOUNTER — Ambulatory Visit: Payer: Managed Care, Other (non HMO)

## 2017-08-10 ENCOUNTER — Ambulatory Visit (INDEPENDENT_AMBULATORY_CARE_PROVIDER_SITE_OTHER): Payer: Managed Care, Other (non HMO)

## 2017-08-10 ENCOUNTER — Other Ambulatory Visit: Payer: Self-pay | Admitting: Internal Medicine

## 2017-08-10 ENCOUNTER — Ambulatory Visit (INDEPENDENT_AMBULATORY_CARE_PROVIDER_SITE_OTHER): Payer: Managed Care, Other (non HMO) | Admitting: Podiatry

## 2017-08-10 DIAGNOSIS — M2042 Other hammer toe(s) (acquired), left foot: Secondary | ICD-10-CM

## 2017-08-10 DIAGNOSIS — M216X2 Other acquired deformities of left foot: Secondary | ICD-10-CM

## 2017-08-11 NOTE — Progress Notes (Signed)
Subjective:    Patient ID: [name] Brown, male   DOB: 66 y.o.   MRN: [id number]   HPI patient states doing very well with left foot    ROS      Objective:  Physical Exam neurovascular status intact with patient's left foot healing well secondary to elevating osteotomy     Assessment:   Doing well elevating osteotomy plantar skin wedge resection      Plan:  Stitches removed wound edges coapted well and dispensed ankle compression stocking advised on continued immobilization elevation and gradual increase in activities

## 2017-08-18 NOTE — Progress Notes (Deleted)
Patient ID: SAYGE SALVATO, male   DOB: 07-04-1951, 66 y.o.   MRN: [id number]  The pt is a 66 y.o.  male w/ PMHx significant for CAD s/p CABG x 3 ( LIMA to LAD, RIMA to PDA, L radial to OM1). He is s/p stent in 2008, s/p EVAR 2009 for AAA,   Last . Cath by Dr Martinique showed stable disease for medical Rx   Cath 10/25/13 [place] Films reviewed  Coronary angiography:  Coronary dominance: right  Left mainstem: Heavily calcified and tortuous. 40% distal stenosis.  Left anterior descending (LAD): Very complex proximal disease up to 95%. Heavy calcification. The LAD is occluded after the first diagonal and first septal perforator. The first diagonal is a moderate sized vessel with 40% disease.  Left circumflex (LCx): The left circumflex is calcified. There is 30-40% disease in the mid vessel. The first OM appears small and is occluded. The second OM is tiny. The terminal OM has segmental 50-60% disease.  Right coronary artery (RCA): 100% proximal occlusion.  The free radial graft to the OM is 100% occluded at the ostium.  The RIMA graft to the RCA is a large graft and widely patent.  The LIMA graft to the LAD is a large graft and is widely patent. It gives collaterals to an RV marginal branch.  Left ventriculography: Left ventricular systolic function is normal, LVEF is estimated at 50-55%, There is hypokinesis of the mid anterior wall, there is no significant mitral regurgitation   Final Conclusions:  1. Severe 3 vessel occlusive CAD  2. Patent LIMA to the LAD  3. Patent RIMA to the RCA  4. Occluded free radial graft to the OM  5. Good LV function.  Recommendations: Medical management.   No longer taking Ranexa  Verapamil d/c due to some AV block . Seen by [place] neuro and started on depakote for cluster headaches    01/30/15 Duplex  Left ICA 60-79% stenosis.   11/2014  EVAR [place] per Dr Scot Dock 2.8 cm sack size stable   No chest pain wanted viagra and told him he should not take this   ROS:  Denies fever, malais, weight loss, blurry vision, decreased visual acuity, cough, sputum, SOB, hemoptysis, pleuritic pain, palpitaitons, heartburn, abdominal pain, melena, lower extremity edema, claudication, or rash.  All other systems reviewed and negative  General: There were no vitals taken for this visit. Affect appropriate Healthy:  appears stated age 6: normal Neck supple with no adenopathy JVP normal bilateral  bruits no thyromegaly Lungs clear with no wheezing and good diaphragmatic motion Heart:  S1/S2 SEM  murmur, no rub, gallop or click PMI normal Abdomen: benighn, BS positve, no tenderness, no AAA no bruit.  No HSM or HJR Distal pulses intact with no bruits No edema Neuro non-focal Skin warm and dry No muscular weakness    Current Outpatient Medications  Medication Sig Dispense Refill  . aspirin EC 81 MG EC tablet Take 1 tablet (81 mg total) by mouth daily.    . Cholecalciferol (VITAMIN D3 PO) Take 1 tablet by mouth daily.    . diclofenac sodium (VOLTAREN) 1 % GEL Apply 2 g topically 4 (four) times daily as needed (pain).    Marland Kitchen divalproex (DEPAKOTE ER) 500 MG 24 hr tablet Take 1 tablet (500 mg total) by mouth daily. 90 tablet 1  . doxycycline (VIBRA-TABS) 100 MG tablet Take 50 mg by mouth daily.     Marland Kitchen ezetimibe (ZETIA) 10 MG tablet Take 1 tablet (10  mg total) by mouth daily. 90 tablet 1  . ferrous fumarate (HEMOCYTE - 106 MG FE) 325 (106 FE) MG TABS tablet Take 1 tablet by mouth daily.    . fish oil-omega-3 fatty acids 1000 MG capsule Take 1,200 mg by mouth 2 (two) times daily.     Marland Kitchen gabapentin (NEURONTIN) 100 MG capsule TAKE 2 CAPSULES(200 MG) BY MOUTH AT BEDTIME 180 capsule 1  . Glucosamine-Chondroitin (GLUCOSAMINE CHONDR COMPLEX PO) Take 500 mg by mouth 2 (two) times daily.     . hydrALAZINE (APRESOLINE) 50 MG tablet TAKE 1 TABLET(50 MG) BY MOUTH TWICE DAILY 180 tablet 1  . hydrochlorothiazide (MICROZIDE) 12.5 MG capsule TAKE 1 CAPSULE(12.5 MG) BY MOUTH DAILY 90  capsule 2  . isosorbide mononitrate (IMDUR) 30 MG 24 hr tablet TAKE 1 TABLET BY MOUTH EVERY DAY 90 tablet 2  . Multiple Vitamin (MULTIVITAMIN) capsule Take 1 capsule by mouth daily.    . niacin (NIASPAN) 1000 MG CR tablet Take 1,000 mg by mouth 2 (two) times daily.      . nitroGLYCERIN (NITROSTAT) 0.4 MG SL tablet DISSOLVE 1 TABLET UNDER THE TONGUE EVERY 5 MINUTES FOR 3 DOSES AS NEEDED FOR CHEST PAIN 25 tablet 4  . ondansetron (ZOFRAN) 4 MG tablet Take 4 mg by mouth every 8 (eight) hours as needed for nausea or vomiting. Dispensed 20; 0 refills on 07/19/17    . oxyCODONE-acetaminophen (PERCOCET) 10-325 MG tablet Take 1 tablet by mouth every 4 (four) hours as needed for pain. 25 tablet 0  . potassium chloride SA (K-DUR,KLOR-CON) 20 MEQ tablet TAKE 1 TABLET BY MOUTH EVERY DAY 90 tablet 2  . ramipril (ALTACE) 10 MG capsule TAKE 1 CAPSULE BY MOUTH TWICE DAILY 60 capsule 9  . tamsulosin (FLOMAX) 0.4 MG CAPS capsule Take 1 capsule (0.4 mg total) by mouth daily. NEED ANNUAL VISIT FOR FURTHER REFILLS 30 capsule 0  . tiZANidine (ZANAFLEX) 4 MG tablet Take 1 tablet (4 mg total) by mouth every 8 (eight) hours as needed for muscle spasms. 90 tablet 1  . traZODone (DESYREL) 50 MG tablet Take 0.5-1 tablets (25-50 mg total) by mouth at bedtime as needed for sleep. 30 tablet 3  . vitamin C (ASCORBIC ACID) 500 MG tablet Take 500 mg by mouth daily.     No current facility-administered medications for this visit.     Allergies  Statins  Electrocardiogram:  07/17/14  SR possible old IMI no change from 2014   10/15/14 SR rate 59 PR 224  LAFB   Assessment and Plan CAD: Stable with no angina and good activity level.  Continue medical Rx occluded free radial to OM   AAA:  Post EVAR f/u Scot Dock residual lumen 2.8 cm  Bruit: Left ICA 60-79% stenosis. Duplex 02/11/16   No TIA symptoms.  Continue antiplatelet Rx and F/U carotid duplex in May 2019 at his request due to cost   HTN: Well controlled. Hydralazine helps   Continue current medications and low sodium Dash type diet.    Chol:   Cholesterol is at goal.  Continue current dose of statin and diet Rx.  No myalgias or side effects.  F/U  LFT's in 6 months. Lab Results  Component Value Date   LDLCALC 100 (H) 07/22/2016  Labs with primary   Urology:  Urinary frequency at night better on flomax f/u primary          F/U with me 6 months    Jenkins Rouge

## 2017-08-29 ENCOUNTER — Ambulatory Visit: Payer: Managed Care, Other (non HMO) | Admitting: Cardiovascular Disease

## 2017-09-07 ENCOUNTER — Ambulatory Visit (INDEPENDENT_AMBULATORY_CARE_PROVIDER_SITE_OTHER): Payer: Managed Care, Other (non HMO)

## 2017-09-07 ENCOUNTER — Encounter: Payer: Self-pay | Admitting: Podiatry

## 2017-09-07 ENCOUNTER — Other Ambulatory Visit: Payer: Self-pay | Admitting: Podiatry

## 2017-09-07 ENCOUNTER — Ambulatory Visit (INDEPENDENT_AMBULATORY_CARE_PROVIDER_SITE_OTHER): Payer: Managed Care, Other (non HMO) | Admitting: Podiatry

## 2017-09-07 DIAGNOSIS — M79672 Pain in left foot: Secondary | ICD-10-CM

## 2017-09-07 DIAGNOSIS — M2042 Other hammer toe(s) (acquired), left foot: Secondary | ICD-10-CM | POA: Diagnosis not present

## 2017-09-08 ENCOUNTER — Ambulatory Visit: Payer: Managed Care, Other (non HMO) | Admitting: Cardiovascular Disease

## 2017-09-08 ENCOUNTER — Encounter: Payer: Self-pay | Admitting: Cardiovascular Disease

## 2017-09-08 VITALS — BP 148/86 | HR 55 | Ht 70.0 in | Wt 196.5 lb

## 2017-09-08 DIAGNOSIS — I481 Persistent atrial fibrillation: Secondary | ICD-10-CM

## 2017-09-08 DIAGNOSIS — I25810 Atherosclerosis of coronary artery bypass graft(s) without angina pectoris: Secondary | ICD-10-CM

## 2017-09-08 DIAGNOSIS — I6523 Occlusion and stenosis of bilateral carotid arteries: Secondary | ICD-10-CM

## 2017-09-08 DIAGNOSIS — I4819 Other persistent atrial fibrillation: Secondary | ICD-10-CM

## 2017-09-08 MED ORDER — APIXABAN 5 MG PO TABS: 5.0000 mg | ORAL_TABLET | Freq: Two times a day (BID) | ORAL | 0 refills | Status: DC

## 2017-09-08 MED ORDER — APIXABAN 5 MG PO TABS: 5.0000 mg | ORAL_TABLET | Freq: Two times a day (BID) | ORAL | 11 refills | Status: DC

## 2017-09-08 NOTE — Progress Notes (Signed)
Subjective:   Patient ID: [name] Brown, male   DOB: 66 y.o.   MRN: [id number]   HPI Patient presents stating I am doing really well and I am not having discomfort at the current time   ROS      Objective:  Physical Exam  Neurovascular status intact negative Homans sign was noted with wound edges well coapted and third MPJ in good position with screw in place with minimal plantar discomfort     Assessment:  Doing well post osteotomy third metatarsal left with no indications of recurrence of lesion     Plan:  H&P condition reviewed and at this point I would allow patient to return to normal shoe gear and if any issues were to occur patient is to let us know.  X-rays indicate the osteotomy is healing well with screw in place and no indications of motion

## 2017-09-08 NOTE — Patient Instructions (Addendum)
Medication Instructions:  Your physician has recommended you make the following change in your medication:  1-START eliquis 5 mg by mouth twice daily.  Labwork: NONE  Testing/Procedures: Your physician has requested that you have an echocardiogram in 6 months. Echocardiography is a painless test that uses sound waves to create images of your heart. It provides your doctor with information about the size and shape of your heart and how well your heart's chambers and valves are working. This procedure takes approximately one hour. There are no restrictions for this procedure.  Your physician has requested that you have a carotid duplex in 6 months. This test is an ultrasound of the carotid arteries in your neck. It looks at blood flow through these arteries that supply the brain with blood. Allow one hour for this exam. There are no restrictions or special instructions.  Follow-Up: Your physician wants you to follow-up in: 6 months with Dr. Johnsie Cancel. You will receive a reminder letter in the mail two months in advance. If you don't receive a letter, please call our office to schedule the follow-up appointment.   If you need a refill on your cardiac medications before your next appointment, please call your pharmacy.

## 2017-09-08 NOTE — Progress Notes (Signed)
Patient ID: [name] Brown, male   DOB: 04/13/1951, 66 y.o.   MRN: [id number]  The pt is a 66 y.o.  male w/ PMHx significant for CAD s/p CABG x 3 ( LIMA to LAD, RIMA to PDA, L radial to OM1). He is s/p stent in 2008, s/p EVAR 2009 for AAA,   Last . Cath by Dr Martinique showed stable disease for medical Rx   Cath 10/25/13 [place] Films reviewed  Coronary angiography:  Coronary dominance: right  Left mainstem: Heavily calcified and tortuous. 40% distal stenosis.  Left anterior descending (LAD): Very complex proximal disease up to 95%. Heavy calcification. The LAD is occluded after the first diagonal and first septal perforator. The first diagonal is a moderate sized vessel with 40% disease.  Left circumflex (LCx): The left circumflex is calcified. There is 30-40% disease in the mid vessel. The first OM appears small and is occluded. The second OM is tiny. The terminal OM has segmental 50-60% disease.  Right coronary artery (RCA): 100% proximal occlusion.  The free radial graft to the OM is 100% occluded at the ostium.  The RIMA graft to the RCA is a large graft and widely patent.  The LIMA graft to the LAD is a large graft and is widely patent. It gives collaterals to an RV marginal branch.  Left ventriculography: Left ventricular systolic function is normal, LVEF is estimated at 50-55%, There is hypokinesis of the mid anterior wall, there is no significant mitral regurgitation   Final Conclusions:  1. Severe 3 vessel occlusive CAD  2. Patent LIMA to the LAD  3. Patent RIMA to the RCA  4. Occluded free radial graft to the OM  5. Good LV function.  Recommendations: Medical management.   No longer taking Ranexa  Verapamil d/c due to some AV block . Seen by [place] neuro and started on depakote for cluster headaches    01/30/15 Duplex  Left ICA 60-79% stenosis.   11/2014  EVAR [place] per Dr Scot Dock 2.8 cm sack size stable   ECG in office today showed afib rate 71 he is unaware of new arrhythmia    This patients CHA2DS2-VASc Score and unadjusted Ischemic Stroke Rate (% per year) is equal to 3.2 % stroke rate/year from a score of 3  Above score calculated as 1 point each if present [CHF, HTN, DM, Vascular=MI/PAD/Aortic Plaque, Age if 65-74, or Male] Above score calculated as 2 points each if present [Age > 75, or Stroke/TIA/TE]  Discussed new diagnosis of afib. He is asymptomatic and had known SSS so I suspect this is natural progression Discussed options of rate control vs conversion. We both agreed since asymptomatic would anticoagulate and  Observe he also does not want a bunch of expensive Rx/Tests done. Agreed to do f/u carotid and echo in 6 months He had normal labs in October so should be ok to use eliquis bid which we both prefer to coumadin   ROS: Denies fever, malais, weight loss, blurry vision, decreased visual acuity, cough, sputum, SOB, hemoptysis, pleuritic pain, palpitaitons, heartburn, abdominal pain, melena, lower extremity edema, claudication, or rash.  All other systems reviewed and negative  General: BP (!) 148/86   Pulse (!) 55   Ht 5\' 10"  (1.778 m)   Wt 196 lb 8 oz (89.1 kg)   SpO2 97%   BMI 28.19 kg/m  Affect appropriate Healthy:  appears stated age HEENT: normal Neck supple with no adenopathy JVP normal bilateral  bruits no thyromegaly Lungs clear with  no wheezing and good diaphragmatic motion Heart:  S1/S2 SEM  murmur, no rub, gallop or click PMI normal Abdomen: benighn, BS positve, no tenderness, no AAA no bruit.  No HSM or HJR Distal pulses intact with no bruits No edema Neuro non-focal Skin warm and dry No muscular weakness    Current Outpatient Medications  Medication Sig Dispense Refill  . aspirin EC 81 MG EC tablet Take 1 tablet (81 mg total) by mouth daily.    . Cholecalciferol (VITAMIN D3 PO) Take 1 tablet by mouth daily.    . diclofenac sodium (VOLTAREN) 1 % GEL Apply 2 g topically 4 (four) times daily as needed (pain).    Marland Kitchen  divalproex (DEPAKOTE ER) 500 MG 24 hr tablet Take 1 tablet (500 mg total) by mouth daily. 90 tablet 1  . Doxycycline Hyclate 50 MG TABS Take 1 tablet by mouth daily.    Marland Kitchen ezetimibe (ZETIA) 10 MG tablet Take 1 tablet (10 mg total) by mouth daily. 90 tablet 1  . ferrous fumarate (HEMOCYTE - 106 MG FE) 325 (106 FE) MG TABS tablet Take 1 tablet by mouth daily.    . fish oil-omega-3 fatty acids 1000 MG capsule Take 1,200 mg by mouth 2 (two) times daily.     Marland Kitchen gabapentin (NEURONTIN) 100 MG capsule TAKE 2 CAPSULES(200 MG) BY MOUTH AT BEDTIME 180 capsule 1  . Glucosamine-Chondroitin (GLUCOSAMINE CHONDR COMPLEX PO) Take 500 mg by mouth 2 (two) times daily.     . hydrALAZINE (APRESOLINE) 50 MG tablet TAKE 1 TABLET(50 MG) BY MOUTH TWICE DAILY 180 tablet 1  . hydrochlorothiazide (MICROZIDE) 12.5 MG capsule TAKE 1 CAPSULE(12.5 MG) BY MOUTH DAILY 90 capsule 2  . isosorbide mononitrate (IMDUR) 30 MG 24 hr tablet TAKE 1 TABLET BY MOUTH EVERY DAY 90 tablet 2  . Multiple Vitamin (MULTIVITAMIN) capsule Take 1 capsule by mouth daily.    . niacin (NIASPAN) 1000 MG CR tablet Take 1,000 mg by mouth 2 (two) times daily.      . nitroGLYCERIN (NITROSTAT) 0.4 MG SL tablet DISSOLVE 1 TABLET UNDER THE TONGUE EVERY 5 MINUTES FOR 3 DOSES AS NEEDED FOR CHEST PAIN 25 tablet 4  . ondansetron (ZOFRAN) 4 MG tablet Take 4 mg by mouth every 8 (eight) hours as needed for nausea or vomiting. Dispensed 20; 0 refills on 07/19/17    . oxyCODONE-acetaminophen (PERCOCET) 10-325 MG tablet Take 1 tablet by mouth every 4 (four) hours as needed for pain. 25 tablet 0  . potassium chloride SA (K-DUR,KLOR-CON) 20 MEQ tablet TAKE 1 TABLET BY MOUTH EVERY DAY 90 tablet 2  . ramipril (ALTACE) 10 MG capsule TAKE 1 CAPSULE BY MOUTH TWICE DAILY 60 capsule 9  . tamsulosin (FLOMAX) 0.4 MG CAPS capsule Take 1 capsule (0.4 mg total) by mouth daily. NEED ANNUAL VISIT FOR FURTHER REFILLS 30 capsule 0  . tiZANidine (ZANAFLEX) 4 MG tablet Take 1 tablet (4 mg total)  by mouth every 8 (eight) hours as needed for muscle spasms. 90 tablet 1  . traZODone (DESYREL) 50 MG tablet Take 0.5-1 tablets (25-50 mg total) by mouth at bedtime as needed for sleep. 30 tablet 3  . vitamin C (ASCORBIC ACID) 500 MG tablet Take 500 mg by mouth daily.     No current facility-administered medications for this visit.     Allergies  Statins  Electrocardiogram:  07/17/14  SR possible old IMI no change from 2014   10/15/14 SR rate 59 PR 224  LAFB   Assessment and Plan  CAD: Stable with no angina and good activity level.  Continue medical Rx occluded free radial to OM   AAA:  Post EVAR f/u Scot Dock residual lumen 2.8 cm  Bruit: Left ICA 60-79% stenosis. Duplex 02/11/16   No TIA symptoms.  Continue antiplatelet Rx and F/U carotid duplex in May 2019 at his request due to cost   HTN: Well controlled. Hydralazine helps  Continue current medications and low sodium Dash type diet.    Chol:   Cholesterol is at goal.  Continue current dose of statin and diet Rx.  No myalgias or side effects.  F/U  LFT's in 6 months. Lab Results  Component Value Date   LDLCALC 100 (H) 07/22/2016  Labs with primary   Urology:  Urinary frequency at night better on flomax f/u primary   Afib: new diagnosis CHA2Vasc 3 asymptomatic eliquis 5 bid no rate control needed known History of SSS echo in 6 months          F/U with me 6 months    Jenkins Rouge

## 2017-09-12 ENCOUNTER — Other Ambulatory Visit: Payer: Self-pay | Admitting: Internal Medicine

## 2017-09-15 ENCOUNTER — Telehealth: Payer: Self-pay | Admitting: Internal Medicine

## 2017-09-15 NOTE — Telephone Encounter (Signed)
Copied from [place] [phone]. Topic: General - Other >> Sep 15, 2017 11:46 AM Darl Householder, RMA wrote: Reason for CRM: Medication refill request for Tamsulosin 0.4 mg to be sent to [facility]

## 2017-09-20 ENCOUNTER — Other Ambulatory Visit: Payer: Self-pay | Admitting: *Deleted

## 2017-09-20 MED ORDER — TAMSULOSIN HCL 0.4 MG PO CAPS: 0.4000 mg | ORAL_CAPSULE | Freq: Every day | ORAL | 0 refills | Status: DC

## 2017-09-20 NOTE — Telephone Encounter (Signed)
Just FYI, P[facility] RN refilled short term. Pt has appt 12/13.

## 2017-09-20 NOTE — Telephone Encounter (Signed)
I refilled for 30 days. OV scheduled for 12/813/18.

## 2017-09-22 ENCOUNTER — Ambulatory Visit: Payer: 59 | Admitting: Internal Medicine

## 2017-09-22 ENCOUNTER — Encounter: Payer: Self-pay | Admitting: Internal Medicine

## 2017-09-22 DIAGNOSIS — G43809 Other migraine, not intractable, without status migrainosus: Secondary | ICD-10-CM

## 2017-09-22 DIAGNOSIS — G44009 Cluster headache syndrome, unspecified, not intractable: Secondary | ICD-10-CM | POA: Diagnosis not present

## 2017-09-22 DIAGNOSIS — Z5181 Encounter for therapeutic drug level monitoring: Secondary | ICD-10-CM | POA: Insufficient documentation

## 2017-09-22 DIAGNOSIS — Z0000 Encounter for general adult medical examination without abnormal findings: Secondary | ICD-10-CM

## 2017-09-22 DIAGNOSIS — N400 Enlarged prostate without lower urinary tract symptoms: Secondary | ICD-10-CM | POA: Diagnosis not present

## 2017-09-22 DIAGNOSIS — D509 Iron deficiency anemia, unspecified: Secondary | ICD-10-CM | POA: Diagnosis not present

## 2017-09-22 MED ORDER — TAMSULOSIN HCL 0.4 MG PO CAPS: 0.4000 mg | ORAL_CAPSULE | Freq: Every day | ORAL | 3 refills | Status: DC

## 2017-09-22 MED ORDER — DIVALPROEX SODIUM ER 500 MG PO TB24: 500.0000 mg | ORAL_TABLET | Freq: Every day | ORAL | 3 refills | Status: DC

## 2017-09-22 NOTE — Assessment & Plan Note (Signed)
Cluster headaches with depakote 500 mg daily, he does need refills soon which are done today. No breakthrough headaches.

## 2017-09-22 NOTE — Assessment & Plan Note (Signed)
Refill flomax daily which is controlling night time symptoms well. Denies wanting change today.

## 2017-09-22 NOTE — Assessment & Plan Note (Signed)
Recent CBC normal!

## 2017-09-22 NOTE — Assessment & Plan Note (Signed)
Declines pneumonia shot today, flu and tetanus up to date. Colonoscopy due and he intends to get this done with history of polyps. Counseled about sun safety and mole surveillance. Given 10 year screening recommendations.

## 2017-09-22 NOTE — Progress Notes (Signed)
   Subjective:    Patient ID: [name] Brown, male    DOB: 1950-11-04, 66 y.o.   MRN: [id number]  HPI The patient is a 66 YO man coming in for physical. Denies new concerns but some right knee instability.   PMH, [facility], social history reviewed and updated.   Review of Systems  Constitutional: Positive for activity change. Negative for appetite change, chills, fever and unexpected weight change.  HENT: Negative.   Eyes: Negative.   Respiratory: Negative for cough, chest tightness and shortness of breath.   Cardiovascular: Negative for chest pain, palpitations and leg swelling.  Gastrointestinal: Negative for abdominal distention, abdominal pain, constipation, diarrhea, nausea and vomiting.  Musculoskeletal: Positive for arthralgias and gait problem.  Skin: Negative.   Psychiatric/Behavioral: Negative.       Objective:   Physical Exam  Constitutional: He is oriented to person, place, and time. He appears well-developed and well-nourished.  HENT:  Head: Normocephalic and atraumatic.  Eyes: EOM are normal.  Neck: Normal range of motion. No JVD present. No thyromegaly present.  Cardiovascular: Normal rate and regular rhythm.  Pulmonary/Chest: Effort normal and breath sounds normal. No respiratory distress. He has no wheezes. He has no rales.  Abdominal: Soft. Bowel sounds are normal. He exhibits no distension. There is no tenderness. There is no rebound.  Musculoskeletal: He exhibits no edema.  Lymphadenopathy:    He has no cervical adenopathy.  Neurological: He is alert and oriented to person, place, and time. Coordination normal.  Skin: Skin is warm and dry.  Psychiatric: He has a normal mood and affect.   Vitals:   09/22/17 0814  BP: 110/70  Pulse: (!) 56  Temp: 97.9 F (36.6 C)  TempSrc: Oral  SpO2: 99%  Weight: 199 lb (90.3 kg)  Height: 5\' 10"  (1.778 m)      Assessment & Plan:

## 2017-09-22 NOTE — Patient Instructions (Signed)

## 2017-10-06 ENCOUNTER — Other Ambulatory Visit: Payer: Self-pay | Admitting: Family Medicine

## 2017-10-06 DIAGNOSIS — G44009 Cluster headache syndrome, unspecified, not intractable: Secondary | ICD-10-CM

## 2017-10-11 ENCOUNTER — Other Ambulatory Visit: Payer: Self-pay | Admitting: Cardiovascular Disease

## 2017-11-03 ENCOUNTER — Ambulatory Visit: Payer: Managed Care, Other (non HMO) | Admitting: Cardiovascular Disease

## 2017-11-09 ENCOUNTER — Other Ambulatory Visit: Payer: Self-pay | Admitting: Cardiovascular Disease

## 2017-11-09 ENCOUNTER — Other Ambulatory Visit: Payer: Self-pay | Admitting: Family Medicine

## 2017-11-15 ENCOUNTER — Other Ambulatory Visit: Payer: Self-pay | Admitting: Cardiovascular Disease

## 2017-11-29 ENCOUNTER — Other Ambulatory Visit: Payer: Self-pay | Admitting: Cardiovascular Disease

## 2017-12-29 ENCOUNTER — Telehealth: Payer: Self-pay

## 2017-12-29 DIAGNOSIS — K635 Polyp of colon: Secondary | ICD-10-CM

## 2017-12-29 NOTE — Telephone Encounter (Signed)
Copied from [place]. Topic: Referral - Request >> Dec 29, 2017 11:40 AM Scherrie Gerlach wrote: Reason for CRM:  wife called to ask if Dr Sharlet Salina would refer him to [facility] for an overdue colonoscopy. Pt last had one at [facility], with Dr Amedeo Plenty.  Routing to dr crawford, please advise, I will call patient back, thanks

## 2018-01-02 NOTE — Addendum Note (Signed)
Addended by: Pricilla Holm A on: 01/02/2018 02:01 PM   Modules accepted: Orders

## 2018-01-02 NOTE — Telephone Encounter (Signed)
Referral placed.

## 2018-01-04 ENCOUNTER — Other Ambulatory Visit: Payer: Self-pay | Admitting: Cardiovascular Disease

## 2018-01-16 ENCOUNTER — Other Ambulatory Visit: Payer: Self-pay | Admitting: Cardiovascular Disease

## 2018-01-16 DIAGNOSIS — I6523 Occlusion and stenosis of bilateral carotid arteries: Secondary | ICD-10-CM

## 2018-01-18 ENCOUNTER — Telehealth: Payer: Self-pay | Admitting: Gastroenterology

## 2018-01-19 NOTE — Telephone Encounter (Signed)
Dr. Havery Moros reviewed previous records and "okay" to schedule OV.  Called and LM on Vmail to call back to schedule.  Records in "records reviewed folder"

## 2018-01-24 ENCOUNTER — Encounter: Payer: Self-pay | Admitting: Gastroenterology

## 2018-02-10 ENCOUNTER — Telehealth: Payer: Self-pay | Admitting: Family Medicine

## 2018-02-13 NOTE — Telephone Encounter (Signed)
Refill denied. Pt needs to schedule an appointment.

## 2018-02-15 MED ORDER — GABAPENTIN 100 MG PO CAPS: ORAL_CAPSULE | ORAL | 0 refills | Status: DC

## 2018-02-15 NOTE — Addendum Note (Signed)
Addended by: Douglass Rivers T on: 02/15/2018 03:25 PM   Modules accepted: Orders

## 2018-02-15 NOTE — Telephone Encounter (Signed)
Pt wife Eustaquio Maize would like to know if pt could get a refill, to least him until his appt. 03/09/18

## 2018-02-15 NOTE — Telephone Encounter (Signed)
rx sent into pharmacy for 1 month.

## 2018-02-27 ENCOUNTER — Ambulatory Visit (HOSPITAL_COMMUNITY)
Admission: RE | Admit: 2018-02-27 | Discharge: 2018-02-27 | Disposition: A | Discharge status: Home / Self Care | Payer: 59 | Source: Ambulatory Visit | Attending: Cardiovascular Disease | Admitting: Cardiovascular Disease

## 2018-02-27 ENCOUNTER — Ambulatory Visit (HOSPITAL_COMMUNITY): Payer: 59 | Attending: Cardiology

## 2018-02-27 ENCOUNTER — Other Ambulatory Visit: Payer: Self-pay

## 2018-02-27 DIAGNOSIS — I6523 Occlusion and stenosis of bilateral carotid arteries: Secondary | ICD-10-CM | POA: Insufficient documentation

## 2018-02-27 DIAGNOSIS — I252 Old myocardial infarction: Secondary | ICD-10-CM | POA: Diagnosis not present

## 2018-02-27 DIAGNOSIS — E785 Hyperlipidemia, unspecified: Secondary | ICD-10-CM | POA: Diagnosis not present

## 2018-02-27 DIAGNOSIS — I4819 Other persistent atrial fibrillation: Secondary | ICD-10-CM

## 2018-02-27 DIAGNOSIS — I4891 Unspecified atrial fibrillation: Secondary | ICD-10-CM | POA: Diagnosis present

## 2018-02-27 DIAGNOSIS — I25810 Atherosclerosis of coronary artery bypass graft(s) without angina pectoris: Secondary | ICD-10-CM | POA: Insufficient documentation

## 2018-02-27 DIAGNOSIS — I481 Persistent atrial fibrillation: Secondary | ICD-10-CM

## 2018-02-27 DIAGNOSIS — I77810 Thoracic aortic ectasia: Secondary | ICD-10-CM | POA: Insufficient documentation

## 2018-02-27 DIAGNOSIS — R001 Bradycardia, unspecified: Secondary | ICD-10-CM | POA: Insufficient documentation

## 2018-02-27 DIAGNOSIS — I351 Nonrheumatic aortic (valve) insufficiency: Secondary | ICD-10-CM | POA: Insufficient documentation

## 2018-02-27 DIAGNOSIS — I119 Hypertensive heart disease without heart failure: Secondary | ICD-10-CM | POA: Diagnosis not present

## 2018-03-08 NOTE — Progress Notes (Signed)
[facility] [address], [place] [address] Phone: [phone] Subjective:     CC: Back pain follow-up  [id number]  [name] Brown is a 67 y.o. male coming in with complaint of back pain. He is not having any pain in his back. He is here for medication refills for zanaflex and gabapentin.   Patient also notes that over the weekend he fell on the left lower tibia. Pain and bruising on the lateral aspect of the tibia. Painful with sitting and standing. Denies any pain or tingling over lateral malleolus or into the toes. Did have swelling at the end of yesterday due to being on his feet. He is using ice and tylenol to alleviate his pain.      Past Medical History:  Diagnosis Date  . Aortic aneurysm ([place]) Endograf 2009  . Bradycardia   . CAD (coronary artery disease) CABG 03/2000   Median sternotomy for coronary artery bypass grafting x 3 (left  . HTN (hypertension)   . Hx of CABG 2001   severe 3 vessel disease  . Hx of colonic polyp 06/2010   Hyperplastic   . Hyperlipidemia   . Myocardial infarction [facility])    Past Surgical History:  Procedure Laterality Date  . CORONARY ARTERY BYPASS GRAFT     AAA stent graft on 12/01/2007 at [facility]  coronary bypass in 2001. Dr. Roxy Manns.  Marland Kitchen FOOT SURGERY Left 07/19/2017  . LEFT HEART CATHETERIZATION WITH CORONARY ANGIOGRAM N/A 10/26/2013   Procedure: LEFT HEART CATHETERIZATION WITH CORONARY ANGIOGRAM;  Surgeon: Peter M Martinique, MD;  Location: [facility] CATH LAB;  Service: Cardiovascular;  Laterality: N/A;   Social History   Socioeconomic History  . Marital status: Married    Spouse name: Not on file  . Number of children: Not on file  . Years of education: Not on file  . Highest education level: Not on file  Occupational History  . Not on file  Social Needs  . Financial resource strain: Not on file  . Food insecurity:    Worry: Not on file    Inability: Not on file  . Transportation  needs:    Medical: Not on file    Non-medical: Not on file  Tobacco Use  . Smoking status: Former Smoker    Packs/day: 2.00    Years: 30.00    Pack years: 60.00    Types: Cigarettes    Last attempt to quit: 01/05/1992    Years since quitting: 26.1  . Smokeless tobacco: Never Used  . Tobacco comment: quit 19 yrs ago  Substance and Sexual Activity  . Alcohol use: No  . Drug use: No  . Sexual activity: Not on file  Lifestyle  . Physical activity:    Days per week: Not on file    Minutes per session: Not on file  . Stress: Not on file  Relationships  . Social connections:    Talks on phone: Not on file    Gets together: Not on file    Attends religious service: Not on file    Active member of club or organization: Not on file    Attends meetings of clubs or organizations: Not on file    Relationship status: Not on file  Other Topics Concern  . 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   Allergies  Allergen Reactions  .  Statins     Myalgias with elevated CPKs   Family History  Problem Relation Age of Onset  . Heart attack Mother   . Heart disease Mother   . Heart attack Father   . Heart disease Father      Past medical history, social, surgical and family history all reviewed in electronic medical record.  No pertanent information unless stated regarding to the chief complaint.   Review of Systems:Review of systems updated and as accurate as of 03/09/18  No headache, visual changes, nausea, vomiting, diarrhea, constipation, dizziness, abdominal pain, skin rash, fevers, chills, night sweats, weight loss, swollen lymph nodes, body aches, joint swelling, muscle aches, chest pain, shortness of breath, mood changes.   Objective  Blood pressure 138/72, pulse 78, height 5\' 10"  (1.778 m), weight 204 lb (92.5 kg), SpO2 95 %. Systems examined below as of 03/09/18   General: No apparent distress alert and  oriented x3 mood and affect normal, dressed appropriately.  HEENT: Pupils equal, extraocular movements intact  Respiratory: Patient's speak in full sentences and does not appear short of breath  Cardiovascular: No lower extremity edema, non tender, no erythema  Skin: Warm dry intact with no signs of infection or rash on extremities or on axial skeleton.  Abdomen: Soft nontender  Neuro: Cranial nerves II through XII are intact, neurovascularly intact in all extremities with 2+ DTRs and 2+ pulses.  Lymph: No lymphadenopathy of posterior or anterior cervical chain or axillae bilaterally.  Gait normal with good balance and coordination.  MSK:  Non tender with full range of motion and good stability and symmetric strength and tone of shoulders, elbows, wrist, hip, knee and ankles bilaterally.  Back Exam:  Inspection: Loss of lordosis Motion: Flexion 35 deg, Extension 25 deg, Side Bending to 35 deg bilaterally,  Rotation to 35 deg bilaterally  SLR laying: Negative  XSLR laying: Negative  Palpable tenderness: Tender to palpation the paraspinal musculature of. FABER: Mild positive bilateral Sensory change: Gross sensation intact to all lumbar and sacral dermatomes.  Reflexes: 2+ at both patellar tendons, 2+ at achilles tendons, Babinski's downgoing.  Strength at foot  Plantar-flexion: 5/5 Dorsi-flexion: 5/5 Eversion: 5/5 Inversion: 5/5  Leg strength  Quad: 5/5 Hamstring: 5/5 Hip flexor: 5/5 Hip abductors: 5/5  Gait unremarkable.    Impression and Recommendations:     This case required medical decision making of moderate complexity.      Note: This dictation was prepared with Dragon dictation along with smaller phrase technology. Any transcriptional errors that result from this process are unintentional.

## 2018-03-09 ENCOUNTER — Ambulatory Visit: Payer: 59 | Admitting: Family Medicine

## 2018-03-09 ENCOUNTER — Encounter: Payer: Self-pay | Admitting: Family Medicine

## 2018-03-09 DIAGNOSIS — M5136 Other intervertebral disc degeneration, lumbar region: Secondary | ICD-10-CM | POA: Diagnosis not present

## 2018-03-09 MED ORDER — GABAPENTIN 100 MG PO CAPS: ORAL_CAPSULE | ORAL | 3 refills | Status: DC

## 2018-03-09 MED ORDER — TIZANIDINE HCL 4 MG PO TABS: 4.0000 mg | ORAL_TABLET | Freq: Three times a day (TID) | ORAL | 3 refills | Status: DC | PRN

## 2018-03-09 NOTE — Assessment & Plan Note (Signed)
Discussed the medications seen 1 year ago and doing ewll  Refills given Discussed red flags given  Icing regimen.  RTC as needed

## 2018-03-09 NOTE — Patient Instructions (Signed)
Good to see you  Arnica lotion 2 times daily to the bruise.  Heat or warm compresses 2 times a day could help Tight socks when working or standing a lot If ot gone in 3 weeks see me again

## 2018-03-14 NOTE — Progress Notes (Signed)
Patient ID: JASYN MEY, male   DOB: [birth year], 67 y.o.   MRN: [id number]  The pt is a 67 y.o.  male w/ PMHx significant for CAD s/p CABG x 3 ( LIMA to LAD, RIMA to PDA, L radial to OM1). He is s/p stent in 2008, s/p EVAR 2009 for AAA,   Last . 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 Seen by [place] neuro and started on depakote for cluster headaches    02/2018  Duplex  Left ICA 40-59% stenosis.   11/2014  EVAR [place] per Dr Scot Dock 2.8 cm sack size stable   Found to be in asymptomatic afib during office visit 08/2017   This patients CHA2DS2-VASc Score and unadjusted Ischemic Stroke Rate (% per year) is equal to 3.2 % stroke rate/year from a score of 3  Above score calculated as 1 point each if present [CHF, HTN, DM, Vascular=MI/PAD/Aortic Plaque, Age if 65-74, or Male] Above score calculated as 2 points each if present [Age > 75, or Stroke/TIA/TE]  Discussed new diagnosis of afib. He is asymptomatic and had known SSS so I suspect this is natural progression Discussed options of rate control vs conversion. We both agreed since asymptomatic would start eliquis and not try to convert   TTE showed LA 60 mm which would make maintenance of NSR difficult  May retire having lots of foot issues Rx by Dr Paulla Dolly and hard to be on his feet all day  ROS: Denies fever, malais, weight loss, blurry vision, decreased visual acuity, cough, sputum, SOB, hemoptysis, pleuritic pain, palpitaitons, heartburn, abdominal pain, melena, lower extremity edema, claudication, or rash.  All other systems reviewed and negative  General: BP 122/62   Pulse 80   Ht 5\' 10"  (1.778 m)   Wt 203 lb 12 oz (92.4 kg)   SpO2 94%   BMI 29.24 kg/m  Affect appropriate Healthy:  appears stated age 25:  normal Neck supple with no adenopathy JVP normal bilateral  bruits no thyromegaly Lungs clear with no wheezing and good diaphragmatic motion Heart:  S1/S2 SEM  murmur, no rub, gallop or click PMI normal Abdomen: benighn, BS positve, no tenderness, no AAA no bruit.  No HSM or HJR Distal pulses intact with no bruits No edema Neuro non-focal Skin warm and dry No muscular weakness    Current Outpatient Medications  Medication Sig Dispense Refill  . apixaban (ELIQUIS) 5 MG TABS tablet Take 1 tablet (5 mg total) by mouth 2 (two) times daily. 60 tablet 11  . aspirin EC 81 MG EC tablet Take 1 tablet (81 mg total) by mouth daily.    . Cholecalciferol (VITAMIN D3 PO) Take 1 tablet by mouth daily.    . diclofenac sodium (VOLTAREN) 1 % GEL Apply 2 g topically 4 (four) times daily as needed (pain).    Marland Kitchen divalproex (DEPAKOTE ER) 500 MG 24 hr tablet Take 1 tablet (500 mg total) by mouth daily. 90 tablet 3  . Doxycycline Hyclate 50 MG TABS Take 1 tablet by mouth daily.    Marland Kitchen ezetimibe (ZETIA) 10 MG tablet TAKE 1 TABLET(10 MG) BY MOUTH DAILY 90 tablet 2  . ferrous fumarate (HEMOCYTE - 106 MG FE) 325 (106 FE) MG TABS tablet Take 1  tablet by mouth daily.    . fish oil-omega-3 fatty acids 1000 MG capsule Take 1,200 mg by mouth 2 (two) times daily.     Marland Kitchen gabapentin (NEURONTIN) 100 MG capsule TAKE 2 CAPSULES(200 MG) BY MOUTH AT BEDTIME 180 capsule 3  . Glucosamine-Chondroitin (GLUCOSAMINE CHONDR COMPLEX PO) Take 500 mg by mouth 2 (two) times daily.     . hydrALAZINE (APRESOLINE) 50 MG tablet TAKE 1 TABLET(50 MG) BY MOUTH TWICE DAILY 180 tablet 2  . hydrochlorothiazide (MICROZIDE) 12.5 MG capsule TAKE 1 CAPSULE(12.5 MG) BY MOUTH DAILY 90 capsule 3  . isosorbide mononitrate (IMDUR) 30 MG 24 hr tablet TAKE 1 TABLET BY MOUTH EVERY DAY 90 tablet 2  . Multiple Vitamin (MULTIVITAMIN) capsule Take 1 capsule by mouth daily.    . niacin (NIASPAN) 1000 MG CR tablet Take 1,000 mg by mouth 2 (two) times daily.      .  nitroGLYCERIN (NITROSTAT) 0.4 MG SL tablet DISSOLVE 1 TABLET UNDER THE TONGUE EVERY 5 MINUTES FOR 3 DOSES AS NEEDED FOR CHEST PAIN 25 tablet 4  . potassium chloride SA (K-DUR,KLOR-CON) 20 MEQ tablet TAKE 1 TABLET BY MOUTH EVERY DAY 90 tablet 2  . ramipril (ALTACE) 10 MG capsule TAKE 1 CAPSULE BY MOUTH TWICE DAILY 180 capsule 2  . tamsulosin (FLOMAX) 0.4 MG CAPS capsule Take 1 capsule (0.4 mg total) by mouth daily. NEED ANNUAL VISIT FOR FURTHER REFILLS 90 capsule 3  . tiZANidine (ZANAFLEX) 4 MG tablet Take 1 tablet (4 mg total) by mouth every 8 (eight) hours as needed for muscle spasms. 270 tablet 3  . traZODone (DESYREL) 50 MG tablet Take 0.5-1 tablets (25-50 mg total) by mouth at bedtime as needed for sleep. 30 tablet 3  . vitamin C (ASCORBIC ACID) 500 MG tablet Take 500 mg by mouth daily.     No current facility-administered medications for this visit.     Allergies  Statins  Electrocardiogram:  07/17/14  SR possible old IMI no change from 2014   10/15/14 SR rate 59 PR 224  LAFB   Assessment and Plan CAD: Stable with no angina and good activity level.  Continue medical Rx occluded free radial to OM EF normal by TTE 02/27/18   AAA:  Post EVAR f/u Scot Dock residual lumen 2.8 cm  Bruit: duplex 9/76/73 stable 41-93% LICA stenosis f/u duplex in a year   HTN: Well controlled. Hydralazine helps  Continue current medications and low sodium Dash type diet.    Chol:   Cholesterol is at goal.  Continue current dose of statin and diet Rx.  No myalgias or side effects.  F/U  LFT's in 6 months. Lab Results  Component Value Date   LDLCALC 100 (H) 07/22/2016  Labs with primary   Urology:  Urinary frequency at night better on flomax f/u primary   Afib: new diagnosis CHA2Vasc 3 asymptomatic eliquis 5 bid no rate control needed known History of SSS  LA 60 mm severely dilated rate control and anticoagulation          F/U with me 6 months    Jenkins Rouge

## 2018-03-15 ENCOUNTER — Encounter: Payer: Self-pay | Admitting: Cardiovascular Disease

## 2018-03-15 ENCOUNTER — Ambulatory Visit: Payer: 59 | Admitting: Cardiovascular Disease

## 2018-03-15 VITALS — BP 122/62 | HR 80 | Ht 70.0 in | Wt 203.8 lb

## 2018-03-15 DIAGNOSIS — I2583 Coronary atherosclerosis due to lipid rich plaque: Secondary | ICD-10-CM

## 2018-03-15 DIAGNOSIS — I714 Abdominal aortic aneurysm, without rupture, unspecified: Secondary | ICD-10-CM

## 2018-03-15 DIAGNOSIS — I10 Essential (primary) hypertension: Secondary | ICD-10-CM

## 2018-03-15 DIAGNOSIS — E782 Mixed hyperlipidemia: Secondary | ICD-10-CM | POA: Diagnosis not present

## 2018-03-15 DIAGNOSIS — I2510 Atherosclerotic heart disease of native coronary artery without angina pectoris: Secondary | ICD-10-CM | POA: Diagnosis not present

## 2018-03-15 DIAGNOSIS — I6523 Occlusion and stenosis of bilateral carotid arteries: Secondary | ICD-10-CM

## 2018-03-15 NOTE — Patient Instructions (Signed)

## 2018-03-17 ENCOUNTER — Ambulatory Visit (INDEPENDENT_AMBULATORY_CARE_PROVIDER_SITE_OTHER): Payer: 59 | Admitting: Gastroenterology

## 2018-03-17 ENCOUNTER — Encounter: Payer: Self-pay | Admitting: Gastroenterology

## 2018-03-17 VITALS — BP 140/74 | HR 56 | Ht 68.25 in | Wt 204.4 lb

## 2018-03-17 DIAGNOSIS — Z1211 Encounter for screening for malignant neoplasm of colon: Secondary | ICD-10-CM | POA: Diagnosis not present

## 2018-03-17 DIAGNOSIS — Z7901 Long term (current) use of anticoagulants: Secondary | ICD-10-CM

## 2018-03-17 NOTE — Progress Notes (Signed)
HPI :  67 y.o.  male w/ PMHx significant for CAD s/p CABG, s/p stent in 2008, s/p EVAR 2009 for AAA, history of atrial fibrillation on eliquis, referred by Pricilla Holm A to discuss colon cancer screening.  His last colonoscopy was done by Dr. Amedeo Plenty in September 2011 at that time he was noted to have a 5 mm sigmoid polyp which returned as hyperplastic. The prep quality was reported as good. He states he had a prior colonoscopy to the last exam which showed colon polyps as well. He does not have the report for that particular exam. He states he is feeling well in general without any complaints today. He denies any bowel changes. He reports his bowel habits are very regular, he denies any blood in his stools. He denies any abdominal pains which bother him. No nausea, no vomiting, no weight loss. No reflux symptoms or dysphagia. He denies any history of anemia. He has been on liquids for atrial fibrillation over the past 6 months. He denies any cardiopulmonary symptoms. No family history of colon cancer.  Echo 02/27/2018 - 55-60%  Colonoscopy 06/22/2010 - 13mm rectosigmoid polyp, hyperplastic, good prep - Dr. Starr [facility]    Past Medical History:  Diagnosis Date  . Aortic aneurysm ([place]) Endograf 2009  . Bradycardia   . CAD (coronary artery disease) CABG 03/2000   Median sternotomy for coronary artery bypass grafting x 3 (left  . HTN (hypertension)   . Hx of CABG 2001   severe 3 vessel disease  . Hx of colonic polyp 06/2010   Hyperplastic   . Hyperlipidemia   . Myocardial infarction [facility])      Past Surgical History:  Procedure Laterality Date  . CORONARY ARTERY BYPASS GRAFT     AAA stent graft on 12/01/2007 at [facility]  coronary bypass in 2001. Dr. Roxy Manns.  Marland Kitchen FOOT SURGERY Left 07/19/2017  . LEFT HEART CATHETERIZATION WITH CORONARY ANGIOGRAM N/A 10/26/2013   Procedure: LEFT HEART CATHETERIZATION WITH CORONARY ANGIOGRAM;  Surgeon: Peter M Martinique, MD;  Location:  [facility] CATH LAB;  Service: Cardiovascular;  Laterality: N/A;   Family History  Problem Relation Age of Onset  . Heart attack Mother   . Heart disease Mother   . Heart attack Father   . Heart disease Father    Social History   Tobacco Use  . Smoking status: Former Smoker    Packs/day: 2.00    Years: 30.00    Pack years: 60.00    Types: Cigarettes    Last attempt to quit: 01/05/1992    Years since quitting: 26.2  . Smokeless tobacco: Never Used  . Tobacco comment: quit 19 yrs ago  Substance Use Topics  . Alcohol use: No  . Drug use: No   Current Outpatient Medications  Medication Sig Dispense Refill  . apixaban (ELIQUIS) 5 MG TABS tablet Take 1 tablet (5 mg total) by mouth 2 (two) times daily. 60 tablet 11  . aspirin EC 81 MG EC tablet Take 1 tablet (81 mg total) by mouth daily.    . Cholecalciferol (VITAMIN D3 PO) Take 1 tablet by mouth daily.    . diclofenac sodium (VOLTAREN) 1 % GEL Apply 2 g topically 4 (four) times daily as needed (pain).    Marland Kitchen divalproex (DEPAKOTE ER) 500 MG 24 hr tablet Take 1 tablet (500 mg total) by mouth daily. 90 tablet 3  . Doxycycline Hyclate 50 MG TABS Take 1 tablet by mouth daily.    Marland Kitchen  ezetimibe (ZETIA) 10 MG tablet TAKE 1 TABLET(10 MG) BY MOUTH DAILY 90 tablet 2  . ferrous fumarate (HEMOCYTE - 106 MG FE) 325 (106 FE) MG TABS tablet Take 1 tablet by mouth daily.    . fish oil-omega-3 fatty acids 1000 MG capsule Take 1,200 mg by mouth 2 (two) times daily.     Marland Kitchen gabapentin (NEURONTIN) 100 MG capsule TAKE 2 CAPSULES(200 MG) BY MOUTH AT BEDTIME 180 capsule 3  . Glucosamine-Chondroitin (GLUCOSAMINE CHONDR COMPLEX PO) Take 500 mg by mouth 2 (two) times daily.     . hydrALAZINE (APRESOLINE) 50 MG tablet TAKE 1 TABLET(50 MG) BY MOUTH TWICE DAILY 180 tablet 2  . hydrochlorothiazide (MICROZIDE) 12.5 MG capsule TAKE 1 CAPSULE(12.5 MG) BY MOUTH DAILY 90 capsule 3  . isosorbide mononitrate (IMDUR) 30 MG 24 hr tablet TAKE 1 TABLET BY MOUTH EVERY DAY 90 tablet 2  .  Multiple Vitamin (MULTIVITAMIN) capsule Take 1 capsule by mouth daily.    . niacin (NIASPAN) 1000 MG CR tablet Take 1,000 mg by mouth 2 (two) times daily.      . potassium chloride SA (K-DUR,KLOR-CON) 20 MEQ tablet TAKE 1 TABLET BY MOUTH EVERY DAY 90 tablet 2  . ramipril (ALTACE) 10 MG capsule TAKE 1 CAPSULE BY MOUTH TWICE DAILY 180 capsule 2  . tamsulosin (FLOMAX) 0.4 MG CAPS capsule Take 1 capsule (0.4 mg total) by mouth daily. NEED ANNUAL VISIT FOR FURTHER REFILLS 90 capsule 3  . tiZANidine (ZANAFLEX) 4 MG tablet Take 1 tablet (4 mg total) by mouth every 8 (eight) hours as needed for muscle spasms. 270 tablet 3  . traZODone (DESYREL) 50 MG tablet Take 0.5-1 tablets (25-50 mg total) by mouth at bedtime as needed for sleep. 30 tablet 3  . vitamin C (ASCORBIC ACID) 500 MG tablet Take 500 mg by mouth daily.    . nitroGLYCERIN (NITROSTAT) 0.4 MG SL tablet DISSOLVE 1 TABLET UNDER THE TONGUE EVERY 5 MINUTES FOR 3 DOSES AS NEEDED FOR CHEST PAIN (Patient not taking: Reported on 03/17/2018) 25 tablet 4   No current facility-administered medications for this visit.    Allergies  Allergen Reactions  . Statins     Myalgias with elevated CPKs     Review of Systems: All systems reviewed and negative except where noted in HPI.   Lab Results  Component Value Date   WBC 7.6 07/22/2016   HGB 14.5 07/22/2016   HCT 42.9 07/22/2016   MCV 96.8 07/22/2016   PLT 263.0 07/22/2016    Lab Results  Component Value Date   CREATININE 1.00 04/11/2017   BUN 18 04/11/2017   NA 138 04/11/2017   K 4.3 04/11/2017   CL 100 04/11/2017   CO2 29 04/11/2017    Lab Results  Component Value Date   ALT 19 04/11/2017   AST 19 04/11/2017   ALKPHOS 43 04/11/2017   BILITOT 0.6 04/11/2017     Physical Exam: BP 140/74 (BP Location: Left Arm, Patient Position: Sitting, Cuff Size: Normal)   Pulse (!) 56 Comment: irregular  Ht 5' 8.25" (1.734 m) Comment: height measured without shoes  Wt 204 lb 6 oz (92.7 kg)    BMI 30.85 kg/m  Constitutional: Pleasant,well-developed, male in no acute distress. HEENT: Normocephalic and atraumatic. Conjunctivae are normal. No scleral icterus. Neck supple.  Cardiovascular: irregularly irregular  Pulmonary/chest: Effort normal and breath sounds normal. No wheezing, rales or rhonchi. Abdominal: Soft, nondistended, nontender. There are no masses palpable. No hepatomegaly. Extremities: no edema Lymphadenopathy: No cervical  adenopathy noted. Neurological: Alert and oriented to person place and time. Skin: Skin is warm and dry. No rashes noted. Psychiatric: Normal mood and affect. Behavior is normal.   ASSESSMENT AND PLAN: 67 year old male with cardiac history as outlined above, here to discuss colon cancer screening. He is currently on Eliquis for history of atrial fibrillation.  He reports a history of colon polyps on his last few exams, I only have report from 2011 showing one hyperplastic polyp. I discussed current guidelines for surveillance/screening purposes. As long as he did not have a high risk adenomatous/precancerous polyp removed prior to his last colonoscopy in 2011, he would not warrant a colonoscopy until 2021. We will reach out to Dr. Amedeo Plenty' old office at [facility]  to obtain his last report to confirm findings. If for some reason he did have a high-risk polyp removed prior to 2011 he may warrant a surveillance exam at this time. When he does have a colonoscopy he'll need to hold the Eliquis this for a few days prior to that exam. He agreed with the plan, we'll await records let him know final recommendations based on that result.  Perry Park Cellar, MD [facility]  CC: Hoyt Koch, *

## 2018-03-17 NOTE — Patient Instructions (Signed)
You will be due for a recall colonoscopy in September 2021. We will send you a reminder in the mail when it gets closer to that time.

## 2018-04-03 ENCOUNTER — Ambulatory Visit: Payer: 59 | Admitting: Family Medicine

## 2018-04-06 ENCOUNTER — Encounter

## 2018-04-06 ENCOUNTER — Ambulatory Visit: Payer: 59 | Admitting: Podiatry

## 2018-04-06 ENCOUNTER — Encounter: Payer: Self-pay | Admitting: Podiatry

## 2018-04-06 DIAGNOSIS — M216X2 Other acquired deformities of left foot: Secondary | ICD-10-CM | POA: Diagnosis not present

## 2018-04-06 DIAGNOSIS — Q828 Other specified congenital malformations of skin: Secondary | ICD-10-CM | POA: Diagnosis not present

## 2018-04-07 NOTE — Progress Notes (Signed)
Subjective:   Patient ID: [name] Brown, male   DOB: 67 y.o.   MRN: [id number]   HPI Patient presents with lesion underneath the left foot and states he is concerned about more surgery   ROS      Objective:  Physical Exam  Neurovascular status intact with incision sites themselves healed well with the distal keratotic lesion underneath the third metatarsal in the very edge of where the incision site was     Assessment:  Keratotic lesion secondary to pressure against this area that is local to this particular area     Plan:  Discussed that I do not recommend further surgery and debridement accomplished today with the possibility long-term for 6 subsequent procedures which may be necessary  X-ray indicates that the screws are in place with no indications of advanced structural pathology

## 2018-04-11 ENCOUNTER — Telehealth: Payer: Self-pay | Admitting: Gastroenterology

## 2018-04-11 NOTE — Telephone Encounter (Signed)
Records received for prior colonoscopies, by Dr. Teena Irani of [facility]  Colonoscopy 09/2004 - 4 x 6-65mm polyps in the transverse, descending, sigmoid, and rectosigmoid - all adenomas. Recommended 3 year follow up  Colonoscopy 06/2010 - 48mm rectosigmoid polyp - hyperplastic   Given the patient's multiple adenomas on his initial colonoscopy in 2005, his next exam after 2011 should have been in 5 years, this he is due for a colonoscopy at this time. He will need to hold Eliquis for 2 days prior to the procedure as his GFR is normal, and we will need approval to do that.  Almyra Free can you help coordinate a colonoscopy at the [facility] for him if he is willing to proceed? Thanks

## 2018-04-14 ENCOUNTER — Telehealth: Payer: Self-pay

## 2018-04-14 NOTE — Telephone Encounter (Signed)
   Primary Cardiologist: Jenkins Rouge, MD  Chart reviewed as part of pre-operative protocol coverage. Given past medical history and time since last visit, based on ACC/AHA guidelines, [name] Brown would be at acceptable risk for the planned procedure without further cardiovascular testing.   OK to hold Eliquis 24 hours pre op.  I will route this recommendation to the requesting party via Epic fax function and remove from pre-op pool.  Please call with questions.  Kerin Ransom, PA-C 04/14/2018, 4:27 PM

## 2018-04-14 NOTE — Telephone Encounter (Signed)
Patient with diagnosis of atrial fibrillation on Eliquis for anticoagulation.    Procedure: colonoscopy Date of procedure: 06/08/18  CHADS2-VASc score of  3 (, HTN, AGE, , CAD, )  CrCl 95.3 Platelet count 263  Per office protocol, patient can hold Eliquis for 1 day prior to procedure.    Marland Kitchen

## 2018-04-14 NOTE — Telephone Encounter (Signed)
Pre-visit 8/15 at 8:30 am Colon 8/29 arrive at 2:30 pm Dr Johnsie Cancel prescribes Eliquis-coag clearance to cardiology  Message left for the patient to call.

## 2018-04-14 NOTE — Telephone Encounter (Signed)
Patient agrees to the dates as scheduled.

## 2018-04-14 NOTE — Telephone Encounter (Signed)
[facility] Pre-operative Risk Assessment     Request for surgical clearance:     Endoscopy Procedure  What type of surgery is being performed?     Colonoscopy  When is this surgery scheduled?     06/08/18  What type of clearance is required ?   Pharmacy  Are there any medications that need to be held prior to surgery and how long?  Eliquis hold for 2 days  Practice name and name of physician performing surgery?      [facility]  What is your office phone and fax number?      Phone- [phone]  Fax330 [phone]  Anesthesia type (None, local, MAC, general) ?       MAC

## 2018-05-10 ENCOUNTER — Other Ambulatory Visit: Payer: Self-pay

## 2018-05-10 ENCOUNTER — Ambulatory Visit (INDEPENDENT_AMBULATORY_CARE_PROVIDER_SITE_OTHER): Payer: 59 | Admitting: Vascular Surgery

## 2018-05-10 ENCOUNTER — Encounter: Payer: Self-pay | Admitting: Vascular Surgery

## 2018-05-10 ENCOUNTER — Ambulatory Visit (HOSPITAL_COMMUNITY)
Admission: RE | Admit: 2018-05-10 | Discharge: 2018-05-10 | Disposition: A | Discharge status: Home / Self Care | Payer: 59 | Source: Ambulatory Visit | Attending: Vascular Surgery | Admitting: Vascular Surgery

## 2018-05-10 VITALS — BP 144/85 | HR 66 | Temp 97.3°F | Resp 18 | Ht 70.0 in | Wt 198.0 lb

## 2018-05-10 DIAGNOSIS — I714 Abdominal aortic aneurysm, without rupture, unspecified: Secondary | ICD-10-CM

## 2018-05-10 NOTE — Progress Notes (Signed)
Patient name: [name] Brown MRN: [id number] DOB: 1951-01-22 Sex: male  REASON FOR VISIT:   Follow-up after EVAR  HPI:   [name] Brown is a pleasant 67 y.o. male who underwent endovascular repair of an aneurysm in 2009.  Was 5.3 cm.  At the time of his last follow-up the maximum diameter of the aneurysm was 3.2 cm.  He comes in for a two-year follow-up visit.  Since I saw him last he denies any abdominal pain or back pain.  He is not a smoker.  He was started on Eliquis for atrial fibrillation.  He takes aspirin but is allergic to statins.  He denies any claudication or rest pain.  Past Medical History:  Diagnosis Date  . Aortic aneurysm ([place]) Endograf 2009  . Atrial fibrillation ([place])   . Bradycardia   . CAD (coronary artery disease) CABG 03/2000   Median sternotomy for coronary artery bypass grafting x 3 (left  . HTN (hypertension)   . Hx of CABG 2001   severe 3 vessel disease  . Hx of colonic polyp 06/2010   Hyperplastic   . Hyperlipidemia   . Myocardial infarction [facility])     Family History  Problem Relation Age of Onset  . Heart attack Mother   . Heart disease Mother   . Heart attack Father   . Heart disease Father     SOCIAL HISTORY: Social History   Tobacco Use  . Smoking status: Former Smoker    Packs/day: 2.00    Years: 30.00    Pack years: 60.00    Types: Cigarettes    Last attempt to quit: 01/05/1992    Years since quitting: 26.3  . Smokeless tobacco: Never Used  . Tobacco comment: quit 19 yrs ago  Substance Use Topics  . Alcohol use: No    Allergies  Allergen Reactions  . Statins     Myalgias with elevated CPKs    Current Outpatient Medications  Medication Sig Dispense Refill  . apixaban (ELIQUIS) 5 MG TABS tablet Take 1 tablet (5 mg total) by mouth 2 (two) times daily. 60 tablet 11  . aspirin EC 81 MG EC tablet Take 1 tablet (81 mg total) by mouth daily.    . Cholecalciferol (VITAMIN D3 PO) Take 1 tablet by mouth daily.    . diclofenac  sodium (VOLTAREN) 1 % GEL Apply 2 g topically 4 (four) times daily as needed (pain).    Marland Kitchen divalproex (DEPAKOTE ER) 500 MG 24 hr tablet Take 1 tablet (500 mg total) by mouth daily. 90 tablet 3  . Doxycycline Hyclate 50 MG TABS Take 1 tablet by mouth daily.    Marland Kitchen ezetimibe (ZETIA) 10 MG tablet TAKE 1 TABLET(10 MG) BY MOUTH DAILY 90 tablet 2  . ferrous fumarate (HEMOCYTE - 106 MG FE) 325 (106 FE) MG TABS tablet Take 1 tablet by mouth daily.    . fish oil-omega-3 fatty acids 1000 MG capsule Take 1,200 mg by mouth 2 (two) times daily.     Marland Kitchen gabapentin (NEURONTIN) 100 MG capsule TAKE 2 CAPSULES(200 MG) BY MOUTH AT BEDTIME 180 capsule 3  . Glucosamine-Chondroitin (GLUCOSAMINE CHONDR COMPLEX PO) Take 500 mg by mouth 2 (two) times daily.     . hydrALAZINE (APRESOLINE) 50 MG tablet TAKE 1 TABLET(50 MG) BY MOUTH TWICE DAILY 180 tablet 2  . hydrochlorothiazide (MICROZIDE) 12.5 MG capsule TAKE 1 CAPSULE(12.5 MG) BY MOUTH DAILY 90 capsule 3  . isosorbide mononitrate (IMDUR) 30 MG 24 hr tablet TAKE  1 TABLET BY MOUTH EVERY DAY 90 tablet 2  . Multiple Vitamin (MULTIVITAMIN) capsule Take 1 capsule by mouth daily.    . niacin (NIASPAN) 1000 MG CR tablet Take 1,000 mg by mouth 2 (two) times daily.      . nitroGLYCERIN (NITROSTAT) 0.4 MG SL tablet DISSOLVE 1 TABLET UNDER THE TONGUE EVERY 5 MINUTES FOR 3 DOSES AS NEEDED FOR CHEST PAIN (Patient not taking: Reported on 03/17/2018) 25 tablet 4  . potassium chloride SA (K-DUR,KLOR-CON) 20 MEQ tablet TAKE 1 TABLET BY MOUTH EVERY DAY 90 tablet 2  . ramipril (ALTACE) 10 MG capsule TAKE 1 CAPSULE BY MOUTH TWICE DAILY 180 capsule 2  . tamsulosin (FLOMAX) 0.4 MG CAPS capsule Take 1 capsule (0.4 mg total) by mouth daily. NEED ANNUAL VISIT FOR FURTHER REFILLS 90 capsule 3  . tiZANidine (ZANAFLEX) 4 MG tablet Take 1 tablet (4 mg total) by mouth every 8 (eight) hours as needed for muscle spasms. 270 tablet 3  . traZODone (DESYREL) 50 MG tablet Take 0.5-1 tablets (25-50 mg total) by  mouth at bedtime as needed for sleep. 30 tablet 3  . vitamin C (ASCORBIC ACID) 500 MG tablet Take 500 mg by mouth daily.     No current facility-administered medications for this visit.     REVIEW OF SYSTEMS:  [X]  denotes positive finding, [ ]  denotes negative finding Cardiac  Comments:  Chest pain or chest pressure:    Shortness of breath upon exertion:    Short of breath when lying flat:    Irregular heart rhythm:        Vascular    Pain in calf, thigh, or hip brought on by ambulation:    Pain in feet at night that wakes you up from your sleep:     Blood clot in your veins:    Leg swelling:         Pulmonary    Oxygen at home:    Productive cough:     Wheezing:         Neurologic    Sudden weakness in arms or legs:     Sudden numbness in arms or legs:     Sudden onset of difficulty speaking or slurred speech:    Temporary loss of vision in one eye:     Problems with dizziness:         Gastrointestinal    Blood in stool:     Vomited blood:         Genitourinary    Burning when urinating:     Blood in urine:        Psychiatric    Major depression:         Hematologic    Bleeding problems:    Problems with blood clotting too easily:        Skin    Rashes or ulcers:        Constitutional    Fever or chills:     PHYSICAL EXAM:   Vitals:   05/10/18 0835 05/10/18 0838  BP: (!) 153/84 (!) 144/85  Pulse: 75 66  Resp: 18   Temp: (!) 97.3 F (36.3 C)   TempSrc: Oral   SpO2: 97%   Weight: 198 lb (89.8 kg)   Height: 5\' 10"  (1.778 m)     GENERAL: The patient is a well-nourished male, in no acute distress. The vital signs are documented above. CARDIAC: There is a regular rate and rhythm.  VASCULAR: I do not detect carotid  bruits. He has palpable femoral pulses and dorsalis pedis pulses bilaterally. PULMONARY: There is good air exchange bilaterally without wheezing or rales. ABDOMEN: Soft and non-tender with normal pitched bowel sounds.  MUSCULOSKELETAL:  There are no major deformities or cyanosis. NEUROLOGIC: No focal weakness or paresthesias are detected. SKIN: There are no ulcers or rashes noted. PSYCHIATRIC: The patient has a normal affect.  DATA:    DUPLEX ABDOMINAL AORTA: I have independently interpreted the duplex of his abdominal aorta.  The maximum diameter of the aneurysm is 2.74 cm which has decreased from 3.2 cm 2 years ago.  The right common iliac artery measures 1.3 cm in maximum diameter.  The left common iliac artery measures 1.3 cm in maximum diameter.  MEDICAL ISSUES:   STATUS POST ENDOVASCULAR ANEURYSM REPAIR IN 2009: His aneurysm continues to decrease in size.  It was 5.3 cm originally and is now down to 2.7 cm.  There is no evidence of endoleak.  For this reason I think we can stretch his follow-up out to 3 years.  I have ordered a duplex in 3 years and I will see him back at that time.  He knows to call sooner if he has problems.  Fortunately he is not a smoker.  His blood pressure is under good control.  He is on aspirin.  He is not on a statin because of an allergy.  Deitra Mayo [facility] [phone]

## 2018-05-10 NOTE — Progress Notes (Signed)
Vitals:   05/10/18 0835  BP: (!) 153/84  Pulse: 75  Resp: 18  Temp: (!) 97.3 F (36.3 C)  TempSrc: Oral  SpO2: 97%  Weight: 198 lb (89.8 kg)  Height: 5\' 10"  (1.778 m)

## 2018-05-23 ENCOUNTER — Other Ambulatory Visit: Payer: Self-pay | Admitting: Family Medicine

## 2018-05-23 DIAGNOSIS — G4700 Insomnia, unspecified: Secondary | ICD-10-CM

## 2018-05-24 ENCOUNTER — Ambulatory Visit: Payer: Managed Care, Other (non HMO) | Admitting: Vascular Surgery

## 2018-05-24 ENCOUNTER — Other Ambulatory Visit (HOSPITAL_COMMUNITY): Payer: Managed Care, Other (non HMO)

## 2018-05-24 NOTE — Telephone Encounter (Signed)
PCP is Dr. Sharlet Salina

## 2018-05-25 ENCOUNTER — Ambulatory Visit (AMBULATORY_SURGERY_CENTER): Payer: Self-pay | Admitting: *Deleted

## 2018-05-25 ENCOUNTER — Encounter: Payer: Self-pay | Admitting: Gastroenterology

## 2018-05-25 VITALS — Ht 70.0 in | Wt 201.6 lb

## 2018-05-25 DIAGNOSIS — Z1211 Encounter for screening for malignant neoplasm of colon: Secondary | ICD-10-CM

## 2018-05-25 MED ORDER — SUPREP BOWEL PREP KIT 17.5-3.13-1.6 GM/177ML PO SOLN: 1.0000 | Freq: Once | ORAL | 0 refills | Status: AC

## 2018-05-25 NOTE — Progress Notes (Signed)
No egg or soy allergy known to patient  No issues with past sedation with any surgeries  or procedures, no intubation problems  No diet pills per patient No home 02 use per patient   blood thinners per patient, Eliquis to hold x 24 hrs  Pt denies issues with constipation   A fib or A flutter  EMMI video sent to pt's e mail

## 2018-06-08 ENCOUNTER — Ambulatory Visit (AMBULATORY_SURGERY_CENTER): Payer: 59 | Admitting: Gastroenterology

## 2018-06-08 ENCOUNTER — Encounter: Payer: Self-pay | Admitting: Gastroenterology

## 2018-06-08 VITALS — BP 110/60 | HR 60 | Temp 98.4°F | Resp 12 | Ht 70.0 in | Wt 201.0 lb

## 2018-06-08 DIAGNOSIS — D125 Benign neoplasm of sigmoid colon: Secondary | ICD-10-CM

## 2018-06-08 DIAGNOSIS — Z86010 Personal history of colonic polyps: Secondary | ICD-10-CM | POA: Diagnosis not present

## 2018-06-08 DIAGNOSIS — D122 Benign neoplasm of ascending colon: Secondary | ICD-10-CM

## 2018-06-08 DIAGNOSIS — K635 Polyp of colon: Secondary | ICD-10-CM

## 2018-06-08 DIAGNOSIS — D123 Benign neoplasm of transverse colon: Secondary | ICD-10-CM

## 2018-06-08 MED ORDER — SODIUM CHLORIDE 0.9 % IV SOLN: 500.0000 mL | Freq: Once | INTRAVENOUS | Status: DC

## 2018-06-08 NOTE — Progress Notes (Signed)
Called to room to assist during endoscopic procedure.  Patient ID and intended procedure confirmed with present staff. Received instructions for my participation in the procedure from the performing physician.  

## 2018-06-08 NOTE — Patient Instructions (Signed)
**   Handouts given on polyps and diverticulosis **   YOU HAD AN ENDOSCOPIC PROCEDURE TODAY AT THE [facility]:   Refer to the procedure report that was given to you for any specific questions about what was found during the examination.  If the procedure report does not answer your questions, please call your gastroenterologist to clarify.  If you requested that your care partner not be given the details of your procedure findings, then the procedure report has been included in a sealed envelope for you to review at your convenience later.  YOU SHOULD EXPECT: Some feelings of bloating in the abdomen. Passage of more gas than usual.  Walking can help get rid of the air that was put into your GI tract during the procedure and reduce the bloating. If you had a lower endoscopy (such as a colonoscopy or flexible sigmoidoscopy) you may notice spotting of blood in your stool or on the toilet paper. If you underwent a bowel prep for your procedure, you may not have a normal bowel movement for a few days.  Please Note:  You might notice some irritation and congestion in your nose or some drainage.  This is from the oxygen used during your procedure.  There is no need for concern and it should clear up in a day or so.  SYMPTOMS TO REPORT IMMEDIATELY:   Following lower endoscopy (colonoscopy or flexible sigmoidoscopy):  Excessive amounts of blood in the stool  Significant tenderness or worsening of abdominal pains  Swelling of the abdomen that is new, acute  Fever of 100F or higher  For urgent or emergent issues, a gastroenterologist can be reached at any hour by calling [phone].   DIET:  We do recommend a small meal at first, but then you may proceed to your regular diet.  Drink plenty of fluids but you should avoid alcoholic beverages for 24 hours.  ACTIVITY:  You should plan to take it easy for the rest of today and you should NOT DRIVE or use heavy machinery until tomorrow (because  of the sedation medicines used during the test).    FOLLOW UP: Our staff will call the number listed on your records the next business day following your procedure to check on you and address any questions or concerns that you may have regarding the information given to you following your procedure. If we do not reach you, we will leave a message.  However, if you are feeling well and you are not experiencing any problems, there is no need to return our call.  We will assume that you have returned to your regular daily activities without incident.  If any biopsies were taken you will be contacted by phone or by letter within the next 1-3 weeks.  Please call us at [phone] if you have not heard about the biopsies in 3 weeks.    SIGNATURES/CONFIDENTIALITY: You and/or your care partner have signed paperwork which will be entered into your electronic medical record.  These signatures attest to the fact that that the information above on your After Visit Summary has been reviewed and is understood.  Full responsibility of the confidentiality of this discharge information lies with you and/or your care-partner. 

## 2018-06-08 NOTE — Op Note (Signed)
[facility] Patient Name: [name] Brown Procedure Date: 06/08/2018 4:16 PM MRN: [id number] Endoscopist: Remo Lipps P. Havery Moros , MD Age: 67 Referring MD:  Date of Birth: November 19, 1950 Gender: Male Account #: [id number] Procedure:                Colonoscopy Indications:              High risk colon cancer surveillance: Personal                            history of colonic polyps Medicines:                Monitored Anesthesia Care Procedure:                Pre-Anesthesia Assessment:                           - Prior to the procedure, a History and Physical                            was performed, and patient medications and                            allergies were reviewed. The patient's tolerance of                            previous anesthesia was also reviewed. The risks                            and benefits of the procedure and the sedation                            options and risks were discussed with the patient.                            All questions were answered, and informed consent                            was obtained. Prior Anticoagulants: The patient has                            taken Eliquis (apixaban), last dose was 2 days                            prior to procedure. ASA Grade Assessment: III - A                            patient with severe systemic disease. After                            reviewing the risks and benefits, the patient was                            deemed in satisfactory condition to undergo the  procedure.                           After obtaining informed consent, the colonoscope                            was passed under direct vision. Throughout the                            procedure, the patient's blood pressure, pulse, and                            oxygen saturations were monitored continuously. The                            Model CF-HQ190L [id number]) scope was introduced         through the anus and advanced to the the cecum,                            identified by appendiceal orifice and ileocecal                            valve. The colonoscopy was performed without                            difficulty. The patient tolerated the procedure                            well. The quality of the bowel preparation was                            adequate. The ileocecal valve, appendiceal orifice,                            and rectum were photographed. Scope In: 4:24:56 PM Scope Out: 4:56:02 PM Scope Withdrawal Time: 0 hours 26 minutes 39 seconds  Total Procedure Duration: 0 hours 31 minutes 6 seconds  Findings:                 The perianal and digital rectal examinations were                            normal.                           Four sessile polyps were found in the ascending                            colon. The polyps were 3 to 4 mm in size. These                            polyps were removed with a cold snare. Resection                            and retrieval were complete.  Two sessile polyps were found in the transverse                            colon. The polyps were 3 to 4 mm in size. These                            polyps were removed with a cold snare. Resection                            and retrieval were complete.                           Four sessile polyps were found in the sigmoid                            colon. The polyps were 3 to 4 mm in size. These                            polyps were removed with a cold snare. Resection                            and retrieval were complete.                           Scattered small-mouthed diverticula were found in                            the entire colon. The sigmoid colon was angulated                            with sharp turns.                           The colon was spastic which prolonged the                            procedure. The exam was otherwise  without                            abnormality. Complications:            No immediate complications. Estimated blood loss:                            Minimal. Estimated Blood Loss:     Estimated blood loss was minimal. Impression:               - Four 3 to 4 mm polyps in the ascending colon,                            removed with a cold snare. Resected and retrieved.                           - Two 3 to 4 mm polyps in the transverse colon,  removed with a cold snare. Resected and retrieved.                           - Four 3 to 4 mm polyps in the sigmoid colon,                            removed with a cold snare. Resected and retrieved.                           - Diverticulosis in the entire examined colon.                           - Spastic colon                           - The examination was otherwise normal. Recommendation:           - Patient has a contact number available for                            emergencies. The signs and symptoms of potential                            delayed complications were discussed with the                            patient. Return to normal activities tomorrow.                            Written discharge instructions were provided to the                            patient.                           - Resume previous diet.                           - Continue present medications.                           - Resume Eliquis on 8/31 in the AM                           - Await pathology results.                           - Repeat colonoscopy for surveillance based on                            pathology results. Remo Lipps P. Armbruster, MD 06/08/2018 5:03:11 PM This report has been signed electronically.

## 2018-06-08 NOTE — Progress Notes (Signed)
A/ox3, pleased with MAC, report to RN 

## 2018-06-13 ENCOUNTER — Telehealth: Payer: Self-pay | Admitting: *Deleted

## 2018-06-13 NOTE — Telephone Encounter (Signed)
  Follow up Call-  Call back number 06/08/2018  Post procedure Call Back phone  # [phone]  Permission to leave phone message Yes  Some recent data might be hidden     Patient questions:  Message left to call us if necessary.

## 2018-06-13 NOTE — Telephone Encounter (Signed)
  Follow up Call-  Call back number 06/08/2018  Post procedure Call Back phone  # [phone]  Permission to leave phone message Yes  Some recent data might be hidden     Patient questions:  Message left to call us if necessary. Second call.

## 2018-06-15 ENCOUNTER — Encounter: Payer: Self-pay | Admitting: Gastroenterology

## 2018-07-15 ENCOUNTER — Other Ambulatory Visit: Payer: Self-pay | Admitting: Internal Medicine

## 2018-07-15 DIAGNOSIS — G4700 Insomnia, unspecified: Secondary | ICD-10-CM

## 2018-08-08 ENCOUNTER — Other Ambulatory Visit: Payer: Self-pay | Admitting: Cardiovascular Disease

## 2018-08-22 ENCOUNTER — Other Ambulatory Visit: Payer: Self-pay | Admitting: Cardiovascular Disease

## 2018-09-05 NOTE — Progress Notes (Signed)
Patient ID: [name] Brown, male   DOB: 1951-06-20, 67 y.o.   MRN: [id number]    67 y.o. 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  Last . Cath by Dr Martinique 2015  showed stable disease for medical Rx The free RIMA to circumflex was occluded with patent LIMA to LAD and RIMA to RCA Mid circumflex with 30-40% disease and OM3 with 50-60% PVD followed by Dr Doren Custard post EVAR    02/2018  Duplex  Left ICA 40-59% stenosis.   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  All other systems reviewed and negative  General: BP 116/78   Pulse (!) 53   Ht 5\' 10"  (1.778 m)   Wt 199 lb 6.4 oz (90.4 kg)   SpO2 95%   BMI 28.61 kg/m  Affect appropriate Healthy:  appears stated age 39: normal Neck supple with no adenopathy JVP normal no bruits no thyromegaly Lungs clear with no wheezing and good diaphragmatic motion Heart:  S1/S2 SEM murmur, no rub, gallop or click PMI normal Abdomen: benighn, BS positve, no tenderness, no AAA no bruit.  No HSM or HJR Distal pulses intact with no bruits No edema Neuro non-focal Skin warm and dry No muscular weakness     Current Outpatient Medications  Medication Sig Dispense Refill  . apixaban (ELIQUIS)  5 MG TABS tablet Take 1 tablet (5 mg total) by mouth 2 (two) times daily. 60 tablet 11  . aspirin EC 81 MG EC tablet Take 1 tablet (81 mg total) by mouth daily.    . Cholecalciferol (VITAMIN D3 PO) Take 1 tablet by mouth daily.    . diclofenac sodium (VOLTAREN) 1 % GEL Apply 2 g topically 4 (four) times daily as needed (pain).    Marland Kitchen divalproex (DEPAKOTE ER) 500 MG 24 hr tablet Take 1 tablet (500 mg total) by mouth daily. 90 tablet 3  . Doxycycline Hyclate 50 MG TABS Take 1 tablet by mouth daily.    . ferrous fumarate (HEMOCYTE - 106 MG FE) 325 (106 FE) MG TABS tablet Take 1 tablet by mouth daily.    . fish oil-omega-3 fatty acids 1000 MG capsule Take 1,200 mg by mouth 2 (two) times daily.     Marland Kitchen gabapentin (NEURONTIN) 100 MG capsule TAKE 2 CAPSULES(200 MG) BY MOUTH AT BEDTIME 180 capsule 3  . Glucosamine-Chondroitin (GLUCOSAMINE CHONDR COMPLEX PO) Take 500 mg by mouth 2 (two) times daily.     . hydrALAZINE (APRESOLINE) 50 MG tablet TAKE 1 TABLET(50 MG) BY MOUTH TWICE DAILY 180 tablet 1  . hydrochlorothiazide (MICROZIDE) 12.5 MG capsule TAKE 1 CAPSULE(12.5 MG) BY MOUTH DAILY 90 capsule 3  . isosorbide mononitrate (IMDUR) 30 MG 24 hr tablet TAKE 1 TABLET  BY MOUTH EVERY DAY 90 tablet 2  . Multiple Vitamin (MULTIVITAMIN) capsule Take 1 capsule by mouth daily.    . niacin (NIASPAN) 1000 MG CR tablet Take 1,000 mg by mouth 2 (two) times daily.      . nitroGLYCERIN (NITROSTAT) 0.4 MG SL tablet DISSOLVE 1 TABLET UNDER THE TONGUE EVERY 5 MINUTES FOR 3 DOSES AS NEEDED FOR CHEST PAIN 25 tablet 4  . potassium chloride SA (K-DUR,KLOR-CON) 20 MEQ tablet TAKE 1 TABLET BY MOUTH EVERY DAY 90 tablet 2  . ramipril (ALTACE) 10 MG capsule TAKE 1 CAPSULE BY MOUTH TWICE DAILY 180 capsule 2  . tamsulosin (FLOMAX) 0.4 MG CAPS capsule Take 1 capsule (0.4 mg total) by mouth daily. Need annual exam with labs for further refills 90 capsule 0  . tiZANidine (ZANAFLEX) 4 MG tablet Take 1 tablet (4 mg total) by mouth every 8  (eight) hours as needed for muscle spasms. 270 tablet 3  . traZODone (DESYREL) 50 MG tablet TAKE 1/2 TO 1 TABLET(25 TO 50 MG) BY MOUTH AT BEDTIME AS NEEDED FOR SLEEP 90 tablet 0  . vitamin C (ASCORBIC ACID) 500 MG tablet Take 500 mg by mouth daily.    Marland Kitchen ezetimibe (ZETIA) 10 MG tablet Take 1 tablet (10 mg total) by mouth daily. 90 tablet 3   No current facility-administered medications for this visit.     Allergies  Statins  Electrocardiogram:  07/17/14  SR possible old IMI no change from 2014   10/15/14 SR rate 59 PR 224  LAFB   Assessment and Plan  CAD: Stable with no angina and good activity level.  Continue medical Rx occluded free radial to OM EF normal by TTE 02/27/18   AAA:  Post EVAR f/u [place] residual lumen 3.2 cm on duplex 05/10/18  Bruit: duplex 5/63/89 stable 37-34% LICA stenosis f/u duplex May 2020  HTN: Well controlled. Hydralazine helps  Continue current medications and low sodium Dash type diet.    Chol:   Cholesterol is at goal.  Continue current dose of statin and diet Rx.  No myalgias or side effects.  F/U  LFT's in 6 months. Lab Results  Component Value Date   LDLCALC 100 (H) 07/22/2016  Labs with primary   Urology:  Urinary frequency at night better on flomax f/u primary   Afib: Diagnosed November 2018  Asymptomatic  CHA2Vasc 3  eliquis 5 bid no rate control needed known History of SSS  LA 60 mm severely dilated rate control and anticoagulation strategy         F/U with me in a year    [employer]

## 2018-09-11 ENCOUNTER — Other Ambulatory Visit: Payer: Self-pay | Admitting: Internal Medicine

## 2018-09-12 ENCOUNTER — Encounter: Payer: Self-pay | Admitting: Cardiovascular Disease

## 2018-09-12 ENCOUNTER — Ambulatory Visit: Payer: PPO | Admitting: Cardiovascular Disease

## 2018-09-12 ENCOUNTER — Encounter (INDEPENDENT_AMBULATORY_CARE_PROVIDER_SITE_OTHER): Payer: Self-pay

## 2018-09-12 VITALS — BP 116/78 | HR 53 | Ht 70.0 in | Wt 199.4 lb

## 2018-09-12 DIAGNOSIS — I2583 Coronary atherosclerosis due to lipid rich plaque: Secondary | ICD-10-CM | POA: Diagnosis not present

## 2018-09-12 DIAGNOSIS — I2510 Atherosclerotic heart disease of native coronary artery without angina pectoris: Secondary | ICD-10-CM | POA: Diagnosis not present

## 2018-09-12 DIAGNOSIS — I10 Essential (primary) hypertension: Secondary | ICD-10-CM | POA: Diagnosis not present

## 2018-09-12 DIAGNOSIS — I714 Abdominal aortic aneurysm, without rupture, unspecified: Secondary | ICD-10-CM

## 2018-09-12 DIAGNOSIS — I6523 Occlusion and stenosis of bilateral carotid arteries: Secondary | ICD-10-CM | POA: Diagnosis not present

## 2018-09-12 DIAGNOSIS — I4819 Other persistent atrial fibrillation: Secondary | ICD-10-CM

## 2018-09-12 MED ORDER — EZETIMIBE 10 MG PO TABS: 10.0000 mg | ORAL_TABLET | Freq: Every day | ORAL | 3 refills | Status: DC

## 2018-09-12 NOTE — Patient Instructions (Addendum)

## 2018-09-27 ENCOUNTER — Other Ambulatory Visit: Payer: Self-pay | Admitting: Cardiovascular Disease

## 2018-10-03 ENCOUNTER — Other Ambulatory Visit: Payer: Self-pay | Admitting: Cardiovascular Disease

## 2018-10-03 ENCOUNTER — Other Ambulatory Visit: Payer: Self-pay | Admitting: Internal Medicine

## 2018-10-03 DIAGNOSIS — G44009 Cluster headache syndrome, unspecified, not intractable: Secondary | ICD-10-CM

## 2018-10-05 NOTE — Telephone Encounter (Signed)
Can have 30 days but due for physical for any more refills.

## 2018-10-23 ENCOUNTER — Other Ambulatory Visit: Payer: Self-pay | Admitting: Cardiovascular Disease

## 2018-10-23 MED ORDER — EZETIMIBE 10 MG PO TABS: 10.0000 mg | ORAL_TABLET | Freq: Every day | ORAL | 3 refills | Status: DC

## 2018-10-23 NOTE — Telephone Encounter (Signed)
Pt last saw Dr Johnsie Cancel 09/12/18, last labs in chart are from 04/11/17 Creat 1.0, but no recent labs in care everywhere or [facility].  Pt's primary MD Dr Sharlet Salina is in [place], no recent labs.  Age 68, weight 90.4 based on specified criteria pt is on appropriate dosage of Eliquis 5mg  BID, but is overdue for labwork.  It appears Marcelle Overlie, [facility] refilled rx 10/05/18 #60 with 5 refills.  Pt requesting mailorder rx.  Pt needs labwork, prior to refill. Will forward to [place], [facility] to address.

## 2018-10-23 NOTE — Telephone Encounter (Signed)
Pt requesting a refill on Eliquis be sent to [facility] order pharmacy. Please address

## 2018-10-24 ENCOUNTER — Encounter: Payer: Self-pay | Admitting: Internal Medicine

## 2018-10-24 ENCOUNTER — Ambulatory Visit (INDEPENDENT_AMBULATORY_CARE_PROVIDER_SITE_OTHER): Payer: PPO | Admitting: Internal Medicine

## 2018-10-24 ENCOUNTER — Other Ambulatory Visit (INDEPENDENT_AMBULATORY_CARE_PROVIDER_SITE_OTHER): Payer: PPO

## 2018-10-24 ENCOUNTER — Telehealth: Payer: Self-pay | Admitting: Cardiovascular Disease

## 2018-10-24 VITALS — BP 138/70 | HR 58 | Temp 98.0°F | Ht 70.0 in | Wt 201.0 lb

## 2018-10-24 DIAGNOSIS — G44009 Cluster headache syndrome, unspecified, not intractable: Secondary | ICD-10-CM

## 2018-10-24 DIAGNOSIS — G43809 Other migraine, not intractable, without status migrainosus: Secondary | ICD-10-CM

## 2018-10-24 DIAGNOSIS — E785 Hyperlipidemia, unspecified: Secondary | ICD-10-CM | POA: Diagnosis not present

## 2018-10-24 DIAGNOSIS — Z23 Encounter for immunization: Secondary | ICD-10-CM | POA: Diagnosis not present

## 2018-10-24 DIAGNOSIS — L729 Follicular cyst of the skin and subcutaneous tissue, unspecified: Secondary | ICD-10-CM | POA: Diagnosis not present

## 2018-10-24 DIAGNOSIS — Z0000 Encounter for general adult medical examination without abnormal findings: Secondary | ICD-10-CM

## 2018-10-24 DIAGNOSIS — I10 Essential (primary) hypertension: Secondary | ICD-10-CM

## 2018-10-24 DIAGNOSIS — R0602 Shortness of breath: Secondary | ICD-10-CM | POA: Diagnosis not present

## 2018-10-24 LAB — COMPREHENSIVE METABOLIC PANEL
ALT: 18 U/L (ref 0–53)
AST: 23 U/L (ref 0–37)
Albumin: 4.4 g/dL (ref 3.5–5.2)
Alkaline Phosphatase: 50 U/L (ref 39–117)
BUN: 22 mg/dL (ref 6–23)
CO2: 28 mEq/L (ref 19–32)
Calcium: 9.9 mg/dL (ref 8.4–10.5)
Chloride: 101 mEq/L (ref 96–112)
Creatinine, Ser: 1.11 mg/dL (ref 0.40–1.50)
GFR: 70.26 mL/min (ref >60.00)
Glucose, Bld: 78 mg/dL (ref 70–99)
Potassium: 3.9 mEq/L (ref 3.5–5.1)
Sodium: 138 mEq/L (ref 135–145)
TOTAL PROTEIN: 7.1 g/dL (ref 6.0–8.3)
Total Bilirubin: 0.7 mg/dL (ref 0.2–1.2)

## 2018-10-24 LAB — CBC
HCT: 42.2 % (ref 39.0–52.0)
Hemoglobin: 14.4 g/dL (ref 13.0–17.0)
MCHC: 34.2 g/dL (ref 30.0–36.0)
MCV: 97.5 fl (ref 78.0–100.0)
Platelets: 210 10*3/uL (ref 150.0–400.0)
RBC: 4.33 Mil/uL (ref 4.22–5.81)
RDW: 14.6 % (ref 11.5–15.5)
WBC: 7.2 10*3/uL (ref 4.0–10.5)

## 2018-10-24 LAB — TSH: TSH: 1.11 u[IU]/mL (ref 0.35–4.50)

## 2018-10-24 LAB — LIPID PANEL
Cholesterol: 161 mg/dL (ref 0–200)
HDL: 33.8 mg/dL — ABNORMAL LOW (ref >39.00)
LDL Cholesterol: 101 mg/dL — ABNORMAL HIGH (ref 0–99)
NonHDL: 127.63
Total CHOL/HDL Ratio: 5
Triglycerides: 132 mg/dL (ref 0.0–149.0)
VLDL: 26.4 mg/dL (ref 0.0–40.0)

## 2018-10-24 LAB — FERRITIN: FERRITIN: 156.6 ng/mL (ref 22.0–322.0)

## 2018-10-24 LAB — BRAIN NATRIURETIC PEPTIDE: Pro B Natriuretic peptide (BNP): 288 pg/mL — ABNORMAL HIGH (ref 0.0–100.0)

## 2018-10-24 MED ORDER — DIVALPROEX SODIUM ER 500 MG PO TB24: 500.0000 mg | ORAL_TABLET | Freq: Every day | ORAL | 3 refills | Status: DC

## 2018-10-24 NOTE — Progress Notes (Signed)
   Subjective:   Patient ID: [name] Brown, male    DOB: Jun 15, 1951, 68 y.o.   MRN: [id number]  HPI Here for medicare wellness and physical, no new complaints. Please see A/P for status and treatment of chronic medical problems.   Diet: heart healthy Physical activity: sedentary Depression/mood screen: negative Hearing: intact to whispered voice Visual acuity: grossly normal, performs annual eye exam  ADLs: capable Fall risk: none Home safety: good Cognitive evaluation: intact to orientation, naming, recall and repetition EOL planning: adv directives discussed    Office Visit from 10/24/2018 in [place]  PHQ-2 Total Score  0        I have personally reviewed and have noted 1. The patient's medical and social history - reviewed today no changes 2. Their use of alcohol, tobacco or illicit drugs 3. Their current medications and supplements 4. The patient's functional ability including ADL's, fall risks, home safety risks and hearing or visual impairment. 5. Diet and physical activities 6. Evidence for depression or mood disorders 7. Care team reviewed and updated (available in snapshot)  Review of Systems  Constitutional: Negative.   HENT: Negative.   Eyes: Negative.   Respiratory: Negative for cough, chest tightness and shortness of breath.   Cardiovascular: Negative for chest pain, palpitations and leg swelling.  Gastrointestinal: Negative for abdominal distention, abdominal pain, constipation, diarrhea, nausea and vomiting.  Musculoskeletal: Negative.   Skin: Negative.        Cyst scalp  Neurological: Negative.   Psychiatric/Behavioral: Negative.     Objective:  Physical Exam Constitutional:      Appearance: He is well-developed.  HENT:     Head: Normocephalic and atraumatic.  Neck:     Musculoskeletal: Normal range of motion.  Cardiovascular:     Rate and Rhythm: Normal rate and regular rhythm.  Pulmonary:     Effort: Pulmonary  effort is normal. No respiratory distress.     Breath sounds: Normal breath sounds. No wheezing or rales.  Abdominal:     General: Bowel sounds are normal. There is no distension.     Palpations: Abdomen is soft.     Tenderness: There is no abdominal tenderness. There is no rebound.  Skin:    General: Skin is warm and dry.     Comments: Cyst right scalp  Neurological:     Mental Status: He is alert and oriented to person, place, and time.     Coordination: Coordination normal.     Vitals:   10/24/18 1548  BP: 138/70  Pulse: (!) 58  Temp: 98 F (36.7 C)  TempSrc: Oral  SpO2: 94%  Weight: 201 lb (91.2 kg)  Height: 5\' 10"  (1.778 m)    Assessment & Plan:  Prevnar 13 given at visit

## 2018-10-24 NOTE — Telephone Encounter (Signed)
New Message   Patient called stating that when he starts doing an activity his HR goes up. He does not know what his HR is but he is not experiencing any shortness of breath or chest pain. He wants to come in and see Dr. Johnsie Cancel so I did schedule him for 10/26/2018.

## 2018-10-24 NOTE — Telephone Encounter (Signed)
Called patient about his message. Patient stated he is having SOB with activity. Patient stated his heart rate goes up with activity as well. Patient stated he has not been taking his pulse, so he does not know how high his HR gets. Patient stated he needs to talk to Dr. Johnsie Cancel about it and has an appointment on Thursday. Will forward to Dr. Johnsie Cancel so he is aware.

## 2018-10-24 NOTE — Progress Notes (Signed)
Patient ID: [name] Brown, male   DOB: 1951-09-14, 68 y.o.   MRN: [id number]    68 y.o. 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  Last . Cath by Dr [place] 2015  showed stable disease for medical Rx The free RIMA to circumflex was occluded with patent LIMA to LAD and RIMA to RCA Mid circumflex with 30-40% disease and OM3 with 50-60% PVD followed by Dr Doren Custard post EVAR    02/2018  Duplex  Left ICA 40-59% stenosis.   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 This occurs when traveling Walking up drive way He has noted tightness in chest that may be wheezing. Functional status has Decreased quite a bit     ROS: Denies fever, malais, weight loss, blurry vision, decreased visual acuity, cough, sputum, SOB, hemoptysis, pleuritic pain, palpitaitons, heartburn, abdominal pain, melena, lower extremity edema, claudication, or rash.  All other systems reviewed and negative  General: BP 124/70   Pulse 69   Ht 5\' 10"  (1.778 m)   Wt 200 lb 1.9 oz (90.8 kg)   SpO2 98%   BMI 28.71 kg/m  Affect appropriate Healthy:  appears stated age 50: normal Neck supple with no adenopathy JVP normal left  bruits no thyromegaly Lungs mild upper lobe  wheezing and good diaphragmatic motion Heart:  S1/S2 SEM murmur, no rub, gallop or click PMI normal Abdomen: benighn, BS positve, no tenderness, no  AAA no bruit.  No HSM or HJR Distal pulses intact with no bruits No edema Neuro non-focal Skin warm and dry No muscular weakness   Current Outpatient Medications  Medication Sig Dispense Refill  . aspirin EC 81 MG EC tablet Take 1 tablet (81 mg total) by mouth daily.    . Cholecalciferol (VITAMIN D3 PO) Take 1 tablet by mouth daily.    . divalproex (DEPAKOTE ER) 500 MG 24 hr tablet Take 1 tablet (500 mg total) by mouth daily. NEEDS ANNUAL VISIT FOR FURTHER REFILLS 90 tablet 3  . Doxycycline Hyclate 50 MG TABS Take 1 tablet by mouth daily.    Marland Kitchen ezetimibe (ZETIA) 10 MG tablet Take 1 tablet (10 mg total) by mouth daily. 90 tablet 3  . fish oil-omega-3 fatty acids 1000 MG capsule Take 1,200 mg by mouth 2 (two) times daily.     Marland Kitchen gabapentin (NEURONTIN) 100 MG capsule TAKE 2 CAPSULES(200 MG) BY MOUTH AT BEDTIME 180 capsule 3  . Glucosamine-Chondroitin (GLUCOSAMINE CHONDR COMPLEX PO) Take 500 mg by mouth 2 (two) times daily.     . hydrALAZINE (APRESOLINE) 50 MG tablet TAKE 1 TABLET(50 MG) BY MOUTH TWICE DAILY 180 tablet 1  . hydrochlorothiazide (MICROZIDE) 12.5 MG capsule TAKE 1 CAPSULE(12.5 MG) BY MOUTH DAILY 90 capsule 3  . IRON, FERROUS SULFATE, PO Take 65 mg by mouth daily.    Marland Kitchen  isosorbide mononitrate (IMDUR) 30 MG 24 hr tablet TAKE 1 TABLET BY MOUTH EVERY DAY 90 tablet 2  . Multiple Vitamin (MULTIVITAMIN) capsule Take 1 capsule by mouth daily.    . niacin (NIASPAN) 1000 MG CR tablet Take 1,000 mg by mouth 2 (two) times daily.      . nitroGLYCERIN (NITROSTAT) 0.4 MG SL tablet DISSOLVE 1 TABLET UNDER THE TONGUE EVERY 5 MINUTES FOR 3 DOSES AS NEEDED FOR CHEST PAIN 25 tablet 4  . potassium chloride SA (K-DUR,KLOR-CON) 20 MEQ tablet TAKE 1 TABLET BY MOUTH EVERY DAY 90 tablet 3  . ramipril (ALTACE) 10 MG capsule TAKE 1 CAPSULE BY MOUTH TWICE DAILY 180 capsule 3  . tamsulosin (FLOMAX) 0.4 MG CAPS capsule Take 1 capsule (0.4 mg total) by mouth daily. Need annual exam with labs for further refills  90 capsule 0  . tiZANidine (ZANAFLEX) 4 MG tablet Take 1 tablet (4 mg total) by mouth every 8 (eight) hours as needed for muscle spasms. 270 tablet 3  . traZODone (DESYREL) 50 MG tablet TAKE 1/2 TO 1 TABLET(25 TO 50 MG) BY MOUTH AT BEDTIME AS NEEDED FOR SLEEP 90 tablet 0  . vitamin C (ASCORBIC ACID) 500 MG tablet Take 500 mg by mouth daily.    Marland Kitchen apixaban (ELIQUIS) 5 MG TABS tablet Take 1 tablet (5 mg total) by mouth 2 (two) times daily. 180 tablet 3   No current facility-administered medications for this visit.     Allergies  Statins  Electrocardiogram:  07/17/14  SR possible old IMI no change from 2014   10/15/14 SR rate 59 PR 224  LAFB   Assessment and Plan  CAD: Occluded free radial to OM EF normal by TTE 02/27/18  With new symptoms will order myovue   AAA:  Post EVAR f/u [place] residual lumen 3.2 cm on duplex 05/10/18  Bruit: duplex 12/23/95 stable 02-63% LICA stenosis f/u duplex May 2020  HTN: Well controlled. Hydralazine helps  Continue current medications and low sodium Dash type diet.    Chol:   Cholesterol is at goal.  Continue current dose of statin and diet Rx.  No myalgias or side effects.  F/U  LFT's in 6 months. Lab Results  Component Value Date   LDLCALC 101 (H) 10/24/2018  Labs with primary   Urology:  Urinary frequency at night better on flomax f/u primary   Afib: Diagnosed November 2018  Asymptomatic  CHA2Vasc 3  eliquis 5 bid known History of SSS  LA 60 mm severely dilated rate control and anticoagulation strategy Will order 48 hour Holter to see what average HR is to see if he needs beta blocker  Dyspnea:  See above TTE to reassess EF unlikely to be able to restore NSR r/o ichemia Needs CXR for wheezing and inhaler called in consider f/u BNP testing after sutdies          F/U with me next available  Time spent with patient addressing new problem dyspnea exam and test ordering 45 minutes    Jenkins Rouge

## 2018-10-24 NOTE — Patient Instructions (Addendum)
We are checking the labs today and will call you back about the results. We will get you in to get the cyst off.   Health Maintenance After Age 67 After age 38, you are at a higher risk for certain long-term diseases and infections as well as injuries from falls. Falls are a major cause of broken bones and head injuries in people who are older than age 72. Getting regular preventive care can help to keep you healthy and well. Preventive care includes getting regular testing and making lifestyle changes as recommended by your health care provider. Talk with your health care provider about:  Which screenings and tests you should have. A screening is a test that checks for a disease when you have no symptoms.  A diet and exercise plan that is right for you. What should I know about screenings and tests to prevent falls? Screening and testing are the best ways to find a health problem early. Early diagnosis and treatment give you the best chance of managing medical conditions that are common after age 24. Certain conditions and lifestyle choices may make you more likely to have a fall. Your health care provider may recommend:  Regular vision checks. Poor vision and conditions such as cataracts can make you more likely to have a fall. If you wear glasses, make sure to get your prescription updated if your vision changes.  Medicine review. Work with your health care provider to regularly review all of the medicines you are taking, including over-the-counter medicines. Ask your health care provider about any side effects that may make you more likely to have a fall. Tell your health care provider if any medicines that you take make you feel dizzy or sleepy.  Osteoporosis screening. Osteoporosis is a condition that causes the bones to get weaker. This can make the bones weak and cause them to break more easily.  Blood pressure screening. Blood pressure changes and medicines to control blood pressure can  make you feel dizzy.  Strength and balance checks. Your health care provider may recommend certain tests to check your strength and balance while standing, walking, or changing positions.  Foot health exam. Foot pain and numbness, as well as not wearing proper footwear, can make you more likely to have a fall.  Depression screening. You may be more likely to have a fall if you have a fear of falling, feel emotionally low, or feel unable to do activities that you used to do.  Alcohol use screening. Using too much alcohol can affect your balance and may make you more likely to have a fall. What actions can I take to lower my risk of falls? General instructions  Talk with your health care provider about your risks for falling. Tell your health care provider if: ? You fall. Be sure to tell your health care provider about all falls, even ones that seem minor. ? You feel dizzy, sleepy, or off-balance.  Take over-the-counter and prescription medicines only as told by your health care provider. These include any supplements.  Eat a healthy diet and maintain a healthy weight. A healthy diet includes low-fat dairy products, low-fat (lean) meats, and fiber from whole grains, beans, and lots of fruits and vegetables. Home safety  Remove any tripping hazards, such as rugs, cords, and clutter.  Install safety equipment such as grab bars in bathrooms and safety rails on stairs.  Keep rooms and walkways well-lit. Activity   Follow a regular exercise program to stay fit. This  will help you maintain your balance. Ask your health care provider what types of exercise are appropriate for you.  If you need a cane or walker, use it as recommended by your health care provider.  Wear supportive shoes that have nonskid soles. Lifestyle  Do not drink alcohol if your health care provider tells you not to drink.  If you drink alcohol, limit how much you have: ? 0-1 drink a day for women. ? 0-2 drinks a  day for men.  Be aware of how much alcohol is in your drink. In the [place] one drink equals one typical bottle of beer (12 oz), one-half glass of wine (5 oz), or one shot of hard liquor (1 oz).  Do not use any products that contain nicotine or tobacco, such as cigarettes and e-cigarettes. If you need help quitting, ask your health care provider. Summary  Having a healthy lifestyle and getting preventive care can help to protect your health and wellness after age 63.  Screening and testing are the best way to find a health problem early and help you avoid having a fall. Early diagnosis and treatment give you the best chance for managing medical conditions that are more common for people who are older than age 70.  Falls are a major cause of broken bones and head injuries in people who are older than age 64. Take precautions to prevent a fall at home.  Work with your health care provider to learn what changes you can make to improve your health and wellness and to prevent falls. This information is not intended to replace advice given to you by your health care provider. Make sure you discuss any questions you have with your health care provider. Document Released: 08/10/2017 Document Revised: 08/10/2017 Document Reviewed: 08/10/2017 Elsevier Interactive Patient Education  2019 [employer].

## 2018-10-25 DIAGNOSIS — Z23 Encounter for immunization: Secondary | ICD-10-CM | POA: Diagnosis not present

## 2018-10-25 DIAGNOSIS — Z0000 Encounter for general adult medical examination without abnormal findings: Secondary | ICD-10-CM | POA: Diagnosis not present

## 2018-10-25 DIAGNOSIS — I10 Essential (primary) hypertension: Secondary | ICD-10-CM | POA: Diagnosis not present

## 2018-10-25 DIAGNOSIS — L729 Follicular cyst of the skin and subcutaneous tissue, unspecified: Secondary | ICD-10-CM | POA: Diagnosis not present

## 2018-10-25 DIAGNOSIS — G43809 Other migraine, not intractable, without status migrainosus: Secondary | ICD-10-CM | POA: Diagnosis not present

## 2018-10-25 DIAGNOSIS — E785 Hyperlipidemia, unspecified: Secondary | ICD-10-CM | POA: Diagnosis not present

## 2018-10-25 NOTE — Assessment & Plan Note (Signed)
Referral to gen surgery for removal.

## 2018-10-25 NOTE — Assessment & Plan Note (Signed)
BP at [facility] on his hydralazine and hctz. Checking CMP and adjust as needed.

## 2018-10-25 NOTE — Assessment & Plan Note (Signed)
Checking choleterol and adjust as needed. Allergy to statins.

## 2018-10-25 NOTE — Assessment & Plan Note (Signed)
Taking depakote and headache free. Will continue.

## 2018-10-25 NOTE — Assessment & Plan Note (Signed)
Flu shot up to date. Pneumonia given 13. Shingrix counseled. Tetanus up to date. Colonoscopy up to date. Counseled about sun safety and mole surveillance. Counseled about the dangers of distracted driving. Given 10 year screening recommendations.

## 2018-10-26 ENCOUNTER — Encounter: Payer: Self-pay | Admitting: Cardiovascular Disease

## 2018-10-26 ENCOUNTER — Ambulatory Visit
Admission: RE | Admit: 2018-10-26 | Discharge: 2018-10-26 | Disposition: A | Discharge status: Home / Self Care | Payer: PPO | Source: Ambulatory Visit | Attending: Cardiovascular Disease | Admitting: Cardiovascular Disease

## 2018-10-26 ENCOUNTER — Ambulatory Visit: Payer: PPO | Admitting: Cardiovascular Disease

## 2018-10-26 VITALS — BP 124/70 | HR 69 | Ht 70.0 in | Wt 200.1 lb

## 2018-10-26 DIAGNOSIS — E782 Mixed hyperlipidemia: Secondary | ICD-10-CM | POA: Diagnosis not present

## 2018-10-26 DIAGNOSIS — I6523 Occlusion and stenosis of bilateral carotid arteries: Secondary | ICD-10-CM

## 2018-10-26 DIAGNOSIS — R0600 Dyspnea, unspecified: Secondary | ICD-10-CM | POA: Diagnosis not present

## 2018-10-26 DIAGNOSIS — I2510 Atherosclerotic heart disease of native coronary artery without angina pectoris: Secondary | ICD-10-CM

## 2018-10-26 DIAGNOSIS — I10 Essential (primary) hypertension: Secondary | ICD-10-CM

## 2018-10-26 DIAGNOSIS — R0989 Other specified symptoms and signs involving the circulatory and respiratory systems: Secondary | ICD-10-CM | POA: Diagnosis not present

## 2018-10-26 DIAGNOSIS — I714 Abdominal aortic aneurysm, without rupture, unspecified: Secondary | ICD-10-CM

## 2018-10-26 DIAGNOSIS — R079 Chest pain, unspecified: Secondary | ICD-10-CM

## 2018-10-26 DIAGNOSIS — I4819 Other persistent atrial fibrillation: Secondary | ICD-10-CM | POA: Diagnosis not present

## 2018-10-26 DIAGNOSIS — I2583 Coronary atherosclerosis due to lipid rich plaque: Secondary | ICD-10-CM

## 2018-10-26 MED ORDER — APIXABAN 5 MG PO TABS: 5.0000 mg | ORAL_TABLET | Freq: Two times a day (BID) | ORAL | 3 refills | Status: DC

## 2018-10-26 NOTE — Patient Instructions (Signed)
Medication Instructions:   If you need a refill on your cardiac medications before your next appointment, please call your pharmacy.   Lab work:  If you have labs (blood work) drawn today and your tests are completely normal, you will receive your results only by: Marland Kitchen MyChart Message (if you have MyChart) OR . A paper copy in the mail If you have any lab test that is abnormal or we need to change your treatment, we will call you to review the results.  Testing/Procedures: Your physician has recommended that you wear a holter monitor. Holter monitors are medical devices that record the heart's electrical activity. Doctors most often use these monitors to diagnose arrhythmias. Arrhythmias are problems with the speed or rhythm of the heartbeat. The monitor is a small, portable device. You can wear one while you do your normal daily activities. This is usually used to diagnose what is causing palpitations/syncope (passing out).  A chest x-ray takes a picture of the organs and structures inside the chest, including the heart, lungs, and blood vessels. This test can show several things, including, whether the heart is enlarges; whether fluid is building up in the lungs; and whether pacemaker / defibrillator leads are still in place.  Your physician has requested that you have an echocardiogram. Echocardiography is a painless test that uses sound waves to create images of your heart. It provides your doctor with information about the size and shape of your heart and how well your heart's chambers and valves are working. This procedure takes approximately one hour. There are no restrictions for this procedure.  Your physician has requested that you have en exercise stress myoview. For further information please visit [email or web address]. Please follow instruction sheet, as given.  Follow-Up: At [facility], you and your health needs are our priority.  As part of our continuing mission to provide you  with exceptional heart care, we have created designated Provider Care Teams.  These Care Teams include your primary Cardiologist (physician) and Advanced Practice Providers (APPs -  Physician Assistants and Nurse Practitioners) who all work together to provide you with the care you need, when you need it. You will need a follow up appointment next available.  You may see Jenkins Rouge, MD or one of the following Advanced Practice Providers on your designated Care Team:   Truitt Merle, NP Cecilie Kicks, NP . Kathyrn Drown, NP

## 2018-10-27 ENCOUNTER — Telehealth: Payer: Self-pay

## 2018-10-27 DIAGNOSIS — R0602 Shortness of breath: Secondary | ICD-10-CM

## 2018-10-27 DIAGNOSIS — R7989 Other specified abnormal findings of blood chemistry: Secondary | ICD-10-CM

## 2018-10-27 MED ORDER — FUROSEMIDE 20 MG PO TABS: 20.0000 mg | ORAL_TABLET | Freq: Every day | ORAL | 3 refills | Status: DC

## 2018-10-27 NOTE — Telephone Encounter (Signed)
Called patient back about Dr. Johnsie Cancel advisement. Per Dr. Johnsie Cancel, BNP is minimally elevated and we have ordered echo to see if EF changed can d/c HCTZ and start lasix 20 mg daily with his K BMET and BNP in 3 weeks. Patient has office visit on 11/21/18, will get lab work at that appt. Echo and other test are scheduled for 11/08/18.

## 2018-10-27 NOTE — Telephone Encounter (Signed)
-----   Message from Josue Hector, MD sent at 10/27/2018  3:45 PM EST ----- BNP is minimally elevated and we have ordered echo to see if EF changed can d/c HCTZ and start lasix 20 mg daily with his K BMET and BNP in 3 weeks

## 2018-11-01 ENCOUNTER — Other Ambulatory Visit: Payer: Self-pay | Admitting: Cardiovascular Disease

## 2018-11-01 MED ORDER — NITROGLYCERIN 0.4 MG SL SUBL: SUBLINGUAL_TABLET | SUBLINGUAL | 6 refills | Status: DC

## 2018-11-01 NOTE — Telephone Encounter (Signed)
Pt's medication was sent to pt's pharmacy as requested. Confirmation received.  °

## 2018-11-02 ENCOUNTER — Other Ambulatory Visit: Payer: Self-pay | Admitting: Internal Medicine

## 2018-11-02 ENCOUNTER — Other Ambulatory Visit: Payer: Self-pay | Admitting: Cardiovascular Disease

## 2018-11-02 DIAGNOSIS — G4700 Insomnia, unspecified: Secondary | ICD-10-CM

## 2018-11-06 ENCOUNTER — Telehealth (HOSPITAL_COMMUNITY): Payer: Self-pay | Admitting: *Deleted

## 2018-11-06 NOTE — Telephone Encounter (Signed)
Patient given detailed instructions per Myocardial Perfusion Study Information Sheet for the test on  11/08/18. Patient notified to arrive 15 minutes early and that it is imperative to arrive on time for appointment to keep from having the test rescheduled.  If you need to cancel or reschedule your appointment, please call the office within 24 hours of your appointment. . Patient verbalized understanding. [name] Brown Jacqueline    

## 2018-11-08 ENCOUNTER — Ambulatory Visit (INDEPENDENT_AMBULATORY_CARE_PROVIDER_SITE_OTHER): Payer: PPO

## 2018-11-08 ENCOUNTER — Ambulatory Visit (HOSPITAL_BASED_OUTPATIENT_CLINIC_OR_DEPARTMENT_OTHER): Payer: PPO

## 2018-11-08 ENCOUNTER — Ambulatory Visit (HOSPITAL_COMMUNITY): Payer: PPO | Attending: Cardiology

## 2018-11-08 DIAGNOSIS — I4819 Other persistent atrial fibrillation: Secondary | ICD-10-CM | POA: Insufficient documentation

## 2018-11-08 DIAGNOSIS — R0600 Dyspnea, unspecified: Secondary | ICD-10-CM

## 2018-11-08 DIAGNOSIS — I4891 Unspecified atrial fibrillation: Secondary | ICD-10-CM | POA: Diagnosis not present

## 2018-11-08 DIAGNOSIS — R079 Chest pain, unspecified: Secondary | ICD-10-CM

## 2018-11-08 LAB — MYOCARDIAL PERFUSION IMAGING
LV dias vol: 142 mL (ref 62–150)
LV sys vol: 62 mL
Peak HR: 75 {beats}/min
Rest HR: 44 {beats}/min
SDS: 11
SRS: 2
SSS: 13
TID: 0.98

## 2018-11-08 MED ORDER — TECHNETIUM TC 99M TETROFOSMIN IV KIT
10.2000 | PACK | Freq: Once | INTRAVENOUS | Status: AC | PRN
  Administered 2018-11-08: 10.2 via INTRAVENOUS
  Filled 2018-11-08: qty 11

## 2018-11-08 MED ORDER — TECHNETIUM TC 99M TETROFOSMIN IV KIT
31.7000 | PACK | Freq: Once | INTRAVENOUS | Status: AC | PRN
  Administered 2018-11-08: 31.7 via INTRAVENOUS
  Filled 2018-11-08: qty 32

## 2018-11-08 MED ORDER — REGADENOSON 0.4 MG/5ML IV SOLN
0.4000 mg | Freq: Once | INTRAVENOUS | Status: AC
  Administered 2018-11-08: 0.4 mg via INTRAVENOUS

## 2018-11-09 ENCOUNTER — Ambulatory Visit: Payer: PPO | Admitting: Cardiovascular Disease

## 2018-11-14 ENCOUNTER — Telehealth: Payer: Self-pay

## 2018-11-14 DIAGNOSIS — R9431 Abnormal electrocardiogram [ECG] [EKG]: Secondary | ICD-10-CM

## 2018-11-14 NOTE — Telephone Encounter (Signed)
Left detailed message for patient that scheduling will be calling him to schedule appt with EP for abnormal monitor. Referral ordered. Will send message to EP scheduler.

## 2018-11-14 NOTE — H&P (View-Only) (Signed)
Patient ID: [name] Brown, male   DOB: 06/14/1951, 68 y.o.   MRN: [id number]    68 y.o. 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  Last . Cath by Dr Martinique 2015  showed stable disease for medical Rx The free RIMA to circumflex was occluded with patent LIMA to LAD and RIMA to RCA Mid circumflex with 30-40% disease and OM3 with 50-60% PVD followed by Dr Doren Custard post EVAR    02/2018  Duplex  Left ICA 40-59% stenosis.   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 This occurs when traveling Walking up drive way He has noted tightness in chest that may be wheezing. Functional status has Decreased quite a bit Myovue non ischemic with normal EF and echo same. BNP only 288 CXR Elevated left hemidiaphragm and scarring   He is feeling some better with change in meds Less dyspnea Discussed options including Trying [facility] prior to seeing EP if fails or has early reversion EP can discuss AAT or less Likely ablative Rx   Risks including pacing , stroke and intubation discussed with patient and wife willing to proceed    ROS: Denies fever, malais, weight loss, blurry vision, decreased visual acuity, cough, sputum, SOB, hemoptysis, pleuritic pain, palpitaitons, heartburn, abdominal pain, melena, lower extremity edema, claudication, or rash.  All other systems reviewed and negative  General: BP 124/78   Pulse 73   Ht 5\' 10"  (1.778 m)   Wt 198 lb (89.8 kg)   BMI 28.41 kg/m  Affect appropriate Healthy:  appears stated age 21: normal Neck supple with no adenopathy JVP normal left  bruits no  thyromegaly Lungs mild upper lobe  wheezing and good diaphragmatic motion Heart:  S1/S2 SEM murmur, no rub, gallop or click PMI normal Abdomen: benighn, BS positve, no tenderness, no AAA no bruit.  No HSM or HJR Distal pulses intact with no bruits No edema Neuro non-focal Skin warm and dry No muscular weakness   Current Outpatient Medications  Medication Sig Dispense Refill  . apixaban (ELIQUIS) 5 MG TABS tablet Take 1 tablet (5 mg total) by mouth 2 (two) times daily. 180 tablet 3  . aspirin EC 81 MG EC tablet Take 1 tablet (81 mg total) by mouth daily.    . Cholecalciferol (VITAMIN D3 PO) Take 1 tablet by mouth daily.    . divalproex (DEPAKOTE ER) 500 MG 24 hr tablet Take 1 tablet (500 mg total) by mouth daily. NEEDS ANNUAL VISIT FOR FURTHER REFILLS 90 tablet 3  . Doxycycline Hyclate 50 MG TABS Take 1 tablet by mouth daily.    Marland Kitchen ezetimibe (ZETIA) 10 MG tablet Take 1 tablet (10 mg total) by mouth daily. 90 tablet 3  . fish oil-omega-3 fatty acids 1000 MG capsule Take 1,200 mg by mouth 2 (two) times daily.     . furosemide (LASIX) 20 MG tablet Take 1 tablet (20 mg total) by mouth daily. 90 tablet 3  . gabapentin (NEURONTIN) 100 MG capsule TAKE 2 CAPSULES(200 MG) BY MOUTH AT BEDTIME 180 capsule  3  . Glucosamine-Chondroitin (GLUCOSAMINE CHONDR COMPLEX PO) Take 500 mg by mouth 2 (two) times daily.     . hydrALAZINE (APRESOLINE) 50 MG tablet TAKE 1 TABLET(50 MG) BY MOUTH TWICE DAILY 180 tablet 1  . IRON, FERROUS SULFATE, PO Take 65 mg by mouth daily.    . isosorbide mononitrate (IMDUR) 30 MG 24 hr tablet TAKE 1 TABLET BY MOUTH EVERY DAY 90 tablet 2  . Multiple Vitamin (MULTIVITAMIN) capsule Take 1 capsule by mouth daily.    . niacin (NIASPAN) 1000 MG CR tablet Take 1,000 mg by mouth 2 (two) times daily.      . nitroGLYCERIN (NITROSTAT) 0.4 MG SL tablet DISSOLVE 1 TABLET UNDER THE TONGUE EVERY 5 MINUTES FOR 3 DOSES AS NEEDED FOR CHEST PAIN 25 tablet 6  . potassium chloride SA  (K-DUR,KLOR-CON) 20 MEQ tablet TAKE 1 TABLET BY MOUTH EVERY DAY 90 tablet 3  . ramipril (ALTACE) 10 MG capsule TAKE 1 CAPSULE BY MOUTH TWICE DAILY 180 capsule 3  . tamsulosin (FLOMAX) 0.4 MG CAPS capsule Take 1 capsule (0.4 mg total) by mouth daily. Need annual exam with labs for further refills 90 capsule 0  . tiZANidine (ZANAFLEX) 4 MG tablet Take 1 tablet (4 mg total) by mouth every 8 (eight) hours as needed for muscle spasms. 270 tablet 3  . traZODone (DESYREL) 50 MG tablet TAKE 1/2 TO 1 TABLET(25 TO 50 MG) BY MOUTH AT BEDTIME AS NEEDED FOR SLEEP 90 tablet 1  . vitamin C (ASCORBIC ACID) 500 MG tablet Take 500 mg by mouth daily.     No current facility-administered medications for this visit.     Allergies  Statins  Electrocardiogram:  07/17/14  SR possible old IMI no change from 2014   10/15/14 SR rate 59 PR 224  LAFB   Assessment and Plan  CAD: Occluded free radial to OM EF normal by TTE 02/27/18  Normal myovue 11/08/18   AAA:  Post EVAR f/u Scot Dock residual lumen 3.2 cm on duplex 05/10/18  Bruit: duplex 2/69/48 stable 54-62% LICA stenosis f/u duplex May 2020  HTN: Well controlled. Hydralazine helps  Continue current medications and low sodium Dash type diet.    Chol:   Cholesterol is at goal.  Continue current dose of statin and diet Rx.  No myalgias or side effects.  F/U  LFT's in 6 months. Lab Results  Component Value Date   LDLCALC 101 (H) 10/24/2018  Labs with primary   Urology:  Urinary frequency at night better on flomax f/u primary   Afib: Diagnosed November 2018  Asymptomatic  CHA2Vasc 3  eliquis 5 bid known History of SSS  LA 60 mm severely dilated rate control and anticoagulation strategy Holter 11/08/18 reviewed Average HR 54 bpm longest pause 3.6 seconds referred to EP Due to marked change in functional status Unlikely to convert with LA 64 mm Will arrange Attempt at [facility] and he will have f/u as above for options if fails or early reversion  Dyspnea:  No ischemia on  myovue and normal EF by TTE 11/08/18 as well CXR showed chronic Elevation in left hemidiaphragm with atelectasis and scarring Dyspnea would not appear to be Related to cardiac abnormality   Anemia:  Requesting Hct, Iron and Ferritin level forward to primary          F/U with EP next available   Time spent with patient addressing afib and setting up [facility] 45 minutes     Jenkins Rouge

## 2018-11-14 NOTE — Progress Notes (Signed)
Patient ID: [name] Brown, male   DOB: [birth year], 68 y.o.   MRN: [id number]    68 y.o. 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  Last . Cath by Dr Martinique 2015  showed stable disease for medical Rx The free RIMA to circumflex was occluded with patent LIMA to LAD and RIMA to RCA Mid circumflex with 30-40% disease and OM3 with 50-60% PVD followed by Dr Doren Custard post EVAR    02/2018  Duplex  Left ICA 40-59% stenosis.   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 This occurs when traveling Walking up drive way He has noted tightness in chest that may be wheezing. Functional status has Decreased quite a bit Myovue non ischemic with normal EF and echo same. BNP only 288 CXR Elevated left hemidiaphragm and scarring   He is feeling some better with change in meds Less dyspnea Discussed options including Trying [facility] prior to seeing EP if fails or has early reversion EP can discuss AAT or less Likely ablative Rx   Risks including pacing , stroke and intubation discussed with patient and wife willing to proceed    ROS: Denies fever, malais, weight loss, blurry vision, decreased visual acuity, cough, sputum, SOB, hemoptysis, pleuritic pain, palpitaitons, heartburn, abdominal pain, melena, lower extremity edema, claudication, or rash.  All other systems reviewed and negative  General: BP 124/78   Pulse 73   Ht 5\' 10"  (1.778 m)   Wt 198 lb (89.8 kg)   BMI 28.41 kg/m  Affect appropriate Healthy:  appears stated age 71: normal Neck supple with no adenopathy JVP normal left  bruits no  thyromegaly Lungs mild upper lobe  wheezing and good diaphragmatic motion Heart:  S1/S2 SEM murmur, no rub, gallop or click PMI normal Abdomen: benighn, BS positve, no tenderness, no AAA no bruit.  No HSM or HJR Distal pulses intact with no bruits No edema Neuro non-focal Skin warm and dry No muscular weakness   Current Outpatient Medications  Medication Sig Dispense Refill  . apixaban (ELIQUIS) 5 MG TABS tablet Take 1 tablet (5 mg total) by mouth 2 (two) times daily. 180 tablet 3  . aspirin EC 81 MG EC tablet Take 1 tablet (81 mg total) by mouth daily.    . Cholecalciferol (VITAMIN D3 PO) Take 1 tablet by mouth daily.    . divalproex (DEPAKOTE ER) 500 MG 24 hr tablet Take 1 tablet (500 mg total) by mouth daily. NEEDS ANNUAL VISIT FOR FURTHER REFILLS 90 tablet 3  . Doxycycline Hyclate 50 MG TABS Take 1 tablet by mouth daily.    Marland Kitchen ezetimibe (ZETIA) 10 MG tablet Take 1 tablet (10 mg total) by mouth daily. 90 tablet 3  . fish oil-omega-3 fatty acids 1000 MG capsule Take 1,200 mg by mouth 2 (two) times daily.     . furosemide (LASIX) 20 MG tablet Take 1 tablet (20 mg total) by mouth daily. 90 tablet 3  . gabapentin (NEURONTIN) 100 MG capsule TAKE 2 CAPSULES(200 MG) BY MOUTH AT BEDTIME 180 capsule  3  . Glucosamine-Chondroitin (GLUCOSAMINE CHONDR COMPLEX PO) Take 500 mg by mouth 2 (two) times daily.     . hydrALAZINE (APRESOLINE) 50 MG tablet TAKE 1 TABLET(50 MG) BY MOUTH TWICE DAILY 180 tablet 1  . IRON, FERROUS SULFATE, PO Take 65 mg by mouth daily.    . isosorbide mononitrate (IMDUR) 30 MG 24 hr tablet TAKE 1 TABLET BY MOUTH EVERY DAY 90 tablet 2  . Multiple Vitamin (MULTIVITAMIN) capsule Take 1 capsule by mouth daily.    . niacin (NIASPAN) 1000 MG CR tablet Take 1,000 mg by mouth 2 (two) times daily.      . nitroGLYCERIN (NITROSTAT) 0.4 MG SL tablet DISSOLVE 1 TABLET UNDER THE TONGUE EVERY 5 MINUTES FOR 3 DOSES AS NEEDED FOR CHEST PAIN 25 tablet 6  . potassium chloride SA  (K-DUR,KLOR-CON) 20 MEQ tablet TAKE 1 TABLET BY MOUTH EVERY DAY 90 tablet 3  . ramipril (ALTACE) 10 MG capsule TAKE 1 CAPSULE BY MOUTH TWICE DAILY 180 capsule 3  . tamsulosin (FLOMAX) 0.4 MG CAPS capsule Take 1 capsule (0.4 mg total) by mouth daily. Need annual exam with labs for further refills 90 capsule 0  . tiZANidine (ZANAFLEX) 4 MG tablet Take 1 tablet (4 mg total) by mouth every 8 (eight) hours as needed for muscle spasms. 270 tablet 3  . traZODone (DESYREL) 50 MG tablet TAKE 1/2 TO 1 TABLET(25 TO 50 MG) BY MOUTH AT BEDTIME AS NEEDED FOR SLEEP 90 tablet 1  . vitamin C (ASCORBIC ACID) 500 MG tablet Take 500 mg by mouth daily.     No current facility-administered medications for this visit.     Allergies  Statins  Electrocardiogram:  07/17/14  SR possible old IMI no change from 2014   10/15/14 SR rate 59 PR 224  LAFB   Assessment and Plan  CAD: Occluded free radial to OM EF normal by TTE 02/27/18  Normal myovue 11/08/18   AAA:  Post EVAR f/u Scot Dock residual lumen 3.2 cm on duplex 05/10/18  Bruit: duplex 12/23/95 stable 02-63% LICA stenosis f/u duplex May 2020  HTN: Well controlled. Hydralazine helps  Continue current medications and low sodium Dash type diet.    Chol:   Cholesterol is at goal.  Continue current dose of statin and diet Rx.  No myalgias or side effects.  F/U  LFT's in 6 months. Lab Results  Component Value Date   LDLCALC 101 (H) 10/24/2018  Labs with primary   Urology:  Urinary frequency at night better on flomax f/u primary   Afib: Diagnosed November 2018  Asymptomatic  CHA2Vasc 3  eliquis 5 bid known History of SSS  LA 60 mm severely dilated rate control and anticoagulation strategy Holter 11/08/18 reviewed Average HR 54 bpm longest pause 3.6 seconds referred to EP Due to marked change in functional status Unlikely to convert with LA 64 mm Will arrange Attempt at [facility] and he will have f/u as above for options if fails or early reversion  Dyspnea:  No ischemia on  myovue and normal EF by TTE 11/08/18 as well CXR showed chronic Elevation in left hemidiaphragm with atelectasis and scarring Dyspnea would not appear to be Related to cardiac abnormality   Anemia:  Requesting Hct, Iron and Ferritin level forward to primary          F/U with EP next available   Time spent with patient addressing afib and setting up [facility] 45 minutes     Jenkins Rouge

## 2018-11-14 NOTE — Telephone Encounter (Signed)
-----   Message from Josue Hector, MD sent at 11/14/2018 11:20 AM EST ----- Regarding: RE: Abnormal holter monitor results There are no studies in my cupid list to read ?? Pam have him see EP for symptomatic afib with pauses 3.6 seconds ----- Message ----- From: Jennefer Bravo Sent: 11/14/2018   9:46 AM EST To: Josue Hector, MD, Michaelyn Barter, RN Subject: Abnormal holter monitor results                Abnormal holter monitor results available for your review. A-Fib with 3.6 second pause.  Please go to " My Cupid Studies,  Assigned to me,  Reading Work List ",  to review patients monitor and sign the study.   Thank you, Darrick Penna

## 2018-11-21 ENCOUNTER — Ambulatory Visit: Payer: PPO | Admitting: Cardiovascular Disease

## 2018-11-21 ENCOUNTER — Other Ambulatory Visit: Payer: PPO

## 2018-11-21 ENCOUNTER — Other Ambulatory Visit: Payer: Self-pay | Admitting: Cardiovascular Disease

## 2018-11-21 VITALS — BP 124/78 | HR 73 | Ht 70.0 in | Wt 198.0 lb

## 2018-11-21 DIAGNOSIS — I10 Essential (primary) hypertension: Secondary | ICD-10-CM | POA: Diagnosis not present

## 2018-11-21 DIAGNOSIS — I714 Abdominal aortic aneurysm, without rupture, unspecified: Secondary | ICD-10-CM

## 2018-11-21 DIAGNOSIS — I2583 Coronary atherosclerosis due to lipid rich plaque: Secondary | ICD-10-CM | POA: Diagnosis not present

## 2018-11-21 DIAGNOSIS — E782 Mixed hyperlipidemia: Secondary | ICD-10-CM

## 2018-11-21 DIAGNOSIS — R0600 Dyspnea, unspecified: Secondary | ICD-10-CM | POA: Diagnosis not present

## 2018-11-21 DIAGNOSIS — R0602 Shortness of breath: Secondary | ICD-10-CM

## 2018-11-21 DIAGNOSIS — I4819 Other persistent atrial fibrillation: Secondary | ICD-10-CM | POA: Diagnosis not present

## 2018-11-21 DIAGNOSIS — R7989 Other specified abnormal findings of blood chemistry: Secondary | ICD-10-CM

## 2018-11-21 DIAGNOSIS — I480 Paroxysmal atrial fibrillation: Secondary | ICD-10-CM

## 2018-11-21 DIAGNOSIS — I2510 Atherosclerotic heart disease of native coronary artery without angina pectoris: Secondary | ICD-10-CM

## 2018-11-21 LAB — BASIC METABOLIC PANEL
BUN/Creatinine Ratio: 16 (ref 10–24)
BUN: 18 mg/dL (ref 8–27)
CO2: 23 mmol/L (ref 20–29)
CREATININE: 1.14 mg/dL (ref 0.76–1.27)
Calcium: 9.9 mg/dL (ref 8.6–10.2)
Chloride: 100 mmol/L (ref 96–106)
GFR calc Af Amer: 77 mL/min/{1.73_m2} (ref >59)
GFR calc non Af Amer: 67 mL/min/{1.73_m2} (ref >59)
Glucose: 79 mg/dL (ref 65–99)
Potassium: 5 mmol/L (ref 3.5–5.2)
SODIUM: 140 mmol/L (ref 134–144)

## 2018-11-21 LAB — PRO B NATRIURETIC PEPTIDE: NT-Pro BNP: 830 pg/mL — ABNORMAL HIGH (ref 0–376)

## 2018-11-21 NOTE — Patient Instructions (Addendum)
Medication Instructions:   If you need a refill on your cardiac medications before your next appointment, please call your pharmacy.   Lab work: Your physician recommends that you have lab work today- BMET, BNP, CBC, Iron , Ferritin.    If you have labs (blood work) drawn today and your tests are completely normal, you will receive your results only by: Marland Kitchen MyChart Message (if you have MyChart) OR . A paper copy in the mail If you have any lab test that is abnormal or we need to change your treatment, we will call you to review the results.  Testing/Procedures: Your physician has recommended that you have a Cardioversion (DCCV). Electrical Cardioversion uses a jolt of electricity to your heart either through paddles or wired patches attached to your chest. This is a controlled, usually prescheduled, procedure. Defibrillation is done under light anesthesia in the hospital, and you usually go home the day of the procedure. This is done to get your heart back into a normal rhythm. You are not awake for the procedure. Please see the instruction sheet given to you today.  Follow-Up: At [facility], you and your health needs are our priority.  As part of our continuing mission to provide you with exceptional heart care, we have created designated Provider Care Teams.  These Care Teams include your primary Cardiologist (physician) and Advanced Practice Providers (APPs -  Physician Assistants and Nurse Practitioners) who all work together to provide you with the care you need, when you need it.  Your physician recommends that you keep your already scheduled appointment with Dr. Curt Bears.

## 2018-11-22 ENCOUNTER — Telehealth: Payer: Self-pay

## 2018-11-22 DIAGNOSIS — R7989 Other specified abnormal findings of blood chemistry: Secondary | ICD-10-CM

## 2018-11-22 MED ORDER — FUROSEMIDE 20 MG PO TABS: 20.0000 mg | ORAL_TABLET | Freq: Two times a day (BID) | ORAL | 3 refills | Status: DC

## 2018-11-22 NOTE — Telephone Encounter (Signed)
-----   Message from Josue Hector, MD sent at 11/22/2018  1:51 PM EST ----- K/Cr ok BNP elevated diuretics adjusted

## 2018-11-22 NOTE — Telephone Encounter (Signed)
Per Dr. Johnsie Cancel, Take Lasix 20 mg by mouth twice daily and repeat lab work in one month, since K/Cr ok BNP elevated. Patient verbalized understanding.

## 2018-11-23 LAB — CBC
HEMATOCRIT: 41.1 % (ref 37.5–51.0)
Hemoglobin: 14.3 g/dL (ref 13.0–17.7)
MCH: 32.3 pg (ref 26.6–33.0)
MCHC: 34.8 g/dL (ref 31.5–35.7)
MCV: 93 fL (ref 79–97)
Platelets: 197 10*3/uL (ref 150–450)
RBC: 4.43 x10E6/uL (ref 4.14–5.80)
RDW: 12.9 % (ref 11.6–15.4)
WBC: 6.4 10*3/uL (ref 3.4–10.8)

## 2018-11-23 LAB — FERRITIN: Ferritin: 238 ng/mL (ref 30–400)

## 2018-11-23 LAB — IRON: Iron: 81 ug/dL (ref 38–169)

## 2018-11-24 DIAGNOSIS — L729 Follicular cyst of the skin and subcutaneous tissue, unspecified: Secondary | ICD-10-CM | POA: Diagnosis not present

## 2018-11-28 ENCOUNTER — Ambulatory Visit (HOSPITAL_COMMUNITY): Payer: PPO | Admitting: Anesthesiology

## 2018-11-28 ENCOUNTER — Ambulatory Visit (HOSPITAL_BASED_OUTPATIENT_CLINIC_OR_DEPARTMENT_OTHER)
Admission: RE | Admit: 2018-11-28 | Discharge: 2018-11-28 | Disposition: A | Discharge status: Home / Self Care | Payer: PPO | Source: Home / Self Care | Attending: Cardiovascular Disease | Admitting: Cardiovascular Disease

## 2018-11-28 ENCOUNTER — Observation Stay (HOSPITAL_COMMUNITY)
Admission: EM | Admit: 2018-11-28 | Discharge: 2018-11-30 | Disposition: A | Discharge status: Home / Self Care | Payer: PPO | Attending: Internal Medicine | Admitting: Internal Medicine

## 2018-11-28 ENCOUNTER — Encounter (HOSPITAL_COMMUNITY): Payer: Self-pay

## 2018-11-28 ENCOUNTER — Emergency Department (HOSPITAL_COMMUNITY): Payer: PPO

## 2018-11-28 ENCOUNTER — Encounter (HOSPITAL_COMMUNITY): Payer: Self-pay | Admitting: *Deleted

## 2018-11-28 ENCOUNTER — Other Ambulatory Visit: Payer: Self-pay

## 2018-11-28 ENCOUNTER — Encounter (HOSPITAL_COMMUNITY)
Admission: RE | Disposition: A | Discharge status: Home / Self Care | Payer: Self-pay | Source: Home / Self Care | Attending: Cardiovascular Disease

## 2018-11-28 DIAGNOSIS — I714 Abdominal aortic aneurysm, without rupture: Secondary | ICD-10-CM | POA: Insufficient documentation

## 2018-11-28 DIAGNOSIS — I10 Essential (primary) hypertension: Secondary | ICD-10-CM | POA: Diagnosis present

## 2018-11-28 DIAGNOSIS — R0600 Dyspnea, unspecified: Secondary | ICD-10-CM | POA: Insufficient documentation

## 2018-11-28 DIAGNOSIS — D649 Anemia, unspecified: Secondary | ICD-10-CM

## 2018-11-28 DIAGNOSIS — R778 Other specified abnormalities of plasma proteins: Secondary | ICD-10-CM | POA: Diagnosis present

## 2018-11-28 DIAGNOSIS — R0902 Hypoxemia: Secondary | ICD-10-CM | POA: Diagnosis not present

## 2018-11-28 DIAGNOSIS — I480 Paroxysmal atrial fibrillation: Secondary | ICD-10-CM | POA: Diagnosis not present

## 2018-11-28 DIAGNOSIS — N400 Enlarged prostate without lower urinary tract symptoms: Secondary | ICD-10-CM | POA: Diagnosis not present

## 2018-11-28 DIAGNOSIS — R072 Precordial pain: Secondary | ICD-10-CM | POA: Diagnosis not present

## 2018-11-28 DIAGNOSIS — R079 Chest pain, unspecified: Principal | ICD-10-CM | POA: Diagnosis present

## 2018-11-28 DIAGNOSIS — Z888 Allergy status to other drugs, medicaments and biological substances status: Secondary | ICD-10-CM | POA: Insufficient documentation

## 2018-11-28 DIAGNOSIS — Z87891 Personal history of nicotine dependence: Secondary | ICD-10-CM | POA: Diagnosis not present

## 2018-11-28 DIAGNOSIS — F1290 Cannabis use, unspecified, uncomplicated: Secondary | ICD-10-CM | POA: Diagnosis present

## 2018-11-28 DIAGNOSIS — I2510 Atherosclerotic heart disease of native coronary artery without angina pectoris: Secondary | ICD-10-CM

## 2018-11-28 DIAGNOSIS — Z7982 Long term (current) use of aspirin: Secondary | ICD-10-CM | POA: Insufficient documentation

## 2018-11-28 DIAGNOSIS — Z951 Presence of aortocoronary bypass graft: Secondary | ICD-10-CM

## 2018-11-28 DIAGNOSIS — I4891 Unspecified atrial fibrillation: Secondary | ICD-10-CM

## 2018-11-28 DIAGNOSIS — Z7901 Long term (current) use of anticoagulants: Secondary | ICD-10-CM

## 2018-11-28 DIAGNOSIS — Z79899 Other long term (current) drug therapy: Secondary | ICD-10-CM | POA: Insufficient documentation

## 2018-11-28 DIAGNOSIS — E785 Hyperlipidemia, unspecified: Secondary | ICD-10-CM | POA: Diagnosis present

## 2018-11-28 DIAGNOSIS — R42 Dizziness and giddiness: Secondary | ICD-10-CM | POA: Diagnosis not present

## 2018-11-28 DIAGNOSIS — I252 Old myocardial infarction: Secondary | ICD-10-CM | POA: Diagnosis not present

## 2018-11-28 DIAGNOSIS — R7989 Other specified abnormal findings of blood chemistry: Secondary | ICD-10-CM | POA: Diagnosis present

## 2018-11-28 DIAGNOSIS — I447 Left bundle-branch block, unspecified: Secondary | ICD-10-CM | POA: Diagnosis not present

## 2018-11-28 DIAGNOSIS — F1011 Alcohol abuse, in remission: Secondary | ICD-10-CM | POA: Diagnosis not present

## 2018-11-28 HISTORY — PX: CARDIOVERSION: SHX1299

## 2018-11-28 LAB — CBC WITH DIFFERENTIAL/PLATELET
Abs Immature Granulocytes: 0.03 10*3/uL (ref 0.00–0.07)
Basophils Absolute: 0 10*3/uL (ref 0.0–0.1)
Basophils Relative: 1 %
EOS PCT: 5 %
Eosinophils Absolute: 0.3 10*3/uL (ref 0.0–0.5)
HCT: 40.6 % (ref 39.0–52.0)
Hemoglobin: 13.1 g/dL (ref 13.0–17.0)
Immature Granulocytes: 1 %
Lymphocytes Relative: 31 %
Lymphs Abs: 2 10*3/uL (ref 0.7–4.0)
MCH: 32.3 pg (ref 26.0–34.0)
MCHC: 32.3 g/dL (ref 30.0–36.0)
MCV: 100 fL (ref 80.0–100.0)
MONO ABS: 0.7 10*3/uL (ref 0.1–1.0)
Monocytes Relative: 10 %
Neutro Abs: 3.6 10*3/uL (ref 1.7–7.7)
Neutrophils Relative %: 52 %
Platelets: 177 10*3/uL (ref 150–400)
RBC: 4.06 MIL/uL — ABNORMAL LOW (ref 4.22–5.81)
RDW: 14.2 % (ref 11.5–15.5)
WBC: 6.6 10*3/uL (ref 4.0–10.5)
nRBC: 0 % (ref 0.0–0.2)

## 2018-11-28 LAB — COMPREHENSIVE METABOLIC PANEL
ALT: 21 U/L (ref 0–44)
AST: 26 U/L (ref 15–41)
Albumin: 3.8 g/dL (ref 3.5–5.0)
Alkaline Phosphatase: 46 U/L (ref 38–126)
Anion gap: 12 (ref 5–15)
BUN: 22 mg/dL (ref 8–23)
CALCIUM: 9.3 mg/dL (ref 8.9–10.3)
CO2: 21 mmol/L — ABNORMAL LOW (ref 22–32)
CREATININE: 1.1 mg/dL (ref 0.61–1.24)
Chloride: 104 mmol/L (ref 98–111)
GFR calc Af Amer: >60 mL/min (ref >60)
GFR calc non Af Amer: >60 mL/min (ref >60)
Glucose, Bld: 144 mg/dL — ABNORMAL HIGH (ref 70–99)
Potassium: 4 mmol/L (ref 3.5–5.1)
Sodium: 137 mmol/L (ref 135–145)
Total Bilirubin: 0.9 mg/dL (ref 0.3–1.2)
Total Protein: 6.5 g/dL (ref 6.5–8.1)

## 2018-11-28 LAB — LIPASE, BLOOD: Lipase: 40 U/L (ref 11–51)

## 2018-11-28 LAB — PROTIME-INR
INR: 1.26
Prothrombin Time: 15.7 seconds — ABNORMAL HIGH (ref 11.4–15.2)

## 2018-11-28 LAB — TROPONIN I: Troponin I: <0.03 ng/mL (ref <0.03)

## 2018-11-28 SURGERY — CARDIOVERSION
Anesthesia: General

## 2018-11-28 MED ORDER — PROPOFOL 10 MG/ML IV BOLUS
INTRAVENOUS | Status: DC | PRN
  Administered 2018-11-28: 80 mg via INTRAVENOUS

## 2018-11-28 MED ORDER — NITROGLYCERIN 2 % TD OINT
1.0000 [in_us] | TOPICAL_OINTMENT | Freq: Four times a day (QID) | TRANSDERMAL | Status: DC
  Administered 2018-11-29: 1 [in_us] via TOPICAL
  Filled 2018-11-28: qty 1

## 2018-11-28 MED ORDER — SODIUM CHLORIDE 0.9 % IV SOLN
INTRAVENOUS | Status: DC | PRN
  Administered 2018-11-28: 13:00:00 via INTRAVENOUS

## 2018-11-28 MED ORDER — SODIUM CHLORIDE 0.45 % IV SOLN: INTRAVENOUS | Status: DC

## 2018-11-28 MED ORDER — LIDOCAINE HCL (CARDIAC) PF 100 MG/5ML IV SOSY
PREFILLED_SYRINGE | INTRAVENOUS | Status: DC | PRN
  Administered 2018-11-28: 70 mg via INTRAVENOUS

## 2018-11-28 NOTE — CV Procedure (Signed)
Procedure: Electrical Cardioversion Indications:  Atrial Fibrillation  Procedure Details:  Consent: Risks of procedure as well as the alternatives and risks of each were explained to the (patient/caregiver).  Consent for procedure obtained.  Time Out: Verified patient identification, verified procedure, site/side was marked, verified correct patient position, special equipment/implants available, medications/allergies/relevent history reviewed, required imaging and test results available. PERFORMED.  Patient placed on cardiac monitor, pulse oximetry, supplemental oxygen as necessary.  Sedation given: propofol per anesthesia Pacer pads placed anterior and posterior chest.  Cardioverted 2 time(s).  Cardioversion with synchronized biphasic 120J, 200J shock.  Evaluation: Findings: Post procedure EKG shows: NSR Complications: None Patient did tolerate procedure well.  Time Spent Directly with the Patient:  30 minutes   Elouise Munroe 11/28/2018, 12:50 PM

## 2018-11-28 NOTE — Anesthesia Postprocedure Evaluation (Signed)
Anesthesia Post Note  Patient: [name] Brown  Procedure(s) Performed: CARDIOVERSION (N/A )     Patient location during evaluation: PACU Anesthesia Type: General Level of consciousness: awake and alert Pain management: pain level controlled Vital Signs Assessment: post-procedure vital signs reviewed and stable Respiratory status: spontaneous breathing, nonlabored ventilation, respiratory function stable and patient connected to nasal cannula oxygen Cardiovascular status: blood pressure returned to baseline and stable Postop Assessment: no apparent nausea or vomiting Anesthetic complications: no    Last Vitals:  Vitals:   11/28/18 1255 11/28/18 1305  BP: 113/67 108/66  Pulse: (!) 54 (!) 54  Resp: 14 14  Temp: (!) 36.2 C   SpO2: 93% 93%    Last Pain:  Vitals:   11/28/18 1305  TempSrc:   PainSc: 0-No pain                 Seaira Byus

## 2018-11-28 NOTE — Discharge Instructions (Signed)
Electrical Cardioversion, Care After °This sheet gives you information about how to care for yourself after your procedure. Your health care provider may also give you more specific instructions. If you have problems or questions, contact your health care provider. °What can I expect after the procedure? °After the procedure, it is common to have: °· Some redness on the skin where the shocks were given. °Follow these instructions at home: ° °· Do not drive for 24 hours if you were given a medicine to help you relax (sedative). °· Take over-the-counter and prescription medicines only as told by your health care provider. °· Ask your health care provider how to check your pulse. Check it often. °· Rest for 48 hours after the procedure or as told by your health care provider. °· Avoid or limit your caffeine use as told by your health care provider. °Contact a health care provider if: °· You feel like your heart is beating too quickly or your pulse is not regular. °· You have a serious muscle cramp that does not go away. °Get help right away if: ° °· You have discomfort in your chest. °· You are dizzy or you feel faint. °· You have trouble breathing or you are short of breath. °· Your speech is slurred. °· You have trouble moving an arm or leg on one side of your body. °· Your fingers or toes turn cold or blue. °This information is not intended to replace advice given to you by your health care provider. Make sure you discuss any questions you have with your health care provider. °Document Released: 07/18/2013 Document Revised: 04/30/2016 Document Reviewed: 04/02/2016 °Elsevier Interactive Patient Education © 2019 [employer] ° °

## 2018-11-28 NOTE — ED Triage Notes (Signed)
[name] for chest pain. Patient was recently d/c from this facility today after a cardioversion. Started c/o central chest pain around 2100 tonight with increased SOB. Given 324 mg aspirin and 2 NTG. Patient is pain free at this time. Denies nausea, vomiting, or abdominal pain.   BP 162/99  HR 90 98% 2L

## 2018-11-28 NOTE — Anesthesia Preprocedure Evaluation (Signed)
Anesthesia Evaluation  Patient identified by MRN, date of birth, ID band Patient awake    Reviewed: Allergy & Precautions, H&P , NPO status , Patient's Chart, lab work & pertinent test results, reviewed documented beta blocker date and time   Airway Mallampati: II  TM Distance: >3 FB Neck ROM: full    Dental no notable dental hx.    Pulmonary neg pulmonary ROS, former smoker,    Pulmonary exam normal breath sounds clear to auscultation       Cardiovascular Exercise Tolerance: Good hypertension, + CAD and + Past MI  + dysrhythmias  Rhythm:regular Rate:Normal  ECHO 20 IMPRESSIONS    1. The left ventricle appears to be normal in size, have mild wall thickness, with normal systolic function of 16-10%. Echo evidence of indeterminate in diastolic filling patterns.  2. No regional wall motion abnormalities seen.  3. Right ventricular systolic pressure is is normal.  4. The right ventricle is mildly enlarged in size, has normal wall thickness and normal systolic function.  5. Severely dilated left atrial size.  6. Moderately dilated right atrial size.  7. Mitral valve regurgitation is trivial by color flow Doppler.  8. The mitral valve normal in structure and function.  9. Normal tricuspid valve. 10. Tricuspid regurgitation is mild. 11. Aortic valve regurgitation is mild by color flow Doppler. 12. Aortic valve tricuspid. 13. Mild dilatation of the ascending aorta. 14. No atrial level shunt detected by color flow Doppler.  MYO perfusion 20  The left ventricular ejection fraction is normal (55-65%).  Nuclear stress EF: 56%. Post CABG septal wall hypokinesis.  There was no ST segment deviation noted during stress.  There is mildly reduced radiotracer uptake at both rest and stress in the anterior, lateral and inferolateral distributions. No ischemia identified. (POST CABG)  This is a low risk study.   Neuro/Psych negative  neurological ROS  negative psych ROS   GI/Hepatic negative GI ROS, Neg liver ROS,   Endo/Other  negative endocrine ROS  Renal/GU negative Renal ROS  negative genitourinary   Musculoskeletal  (+) Arthritis ,   Abdominal   Peds  Hematology  (+) Blood dyscrasia, anemia ,   Anesthesia Other Findings   Reproductive/Obstetrics negative OB ROS                             Anesthesia Physical Anesthesia Plan  ASA: III  Anesthesia Plan: General   Post-op Pain Management:    Induction: Intravenous  PONV Risk Score and Plan: 2 and Treatment may vary due to age or medical condition  Airway Management Planned:   Additional Equipment:   Intra-op Plan:   Post-operative Plan: Extubation in OR  Informed Consent: I have reviewed the patients History and Physical, chart, labs and discussed the procedure including the risks, benefits and alternatives for the proposed anesthesia with the patient or authorized representative who has indicated his/her understanding and acceptance.     Dental Advisory Given  Plan Discussed with: CRNA, Anesthesiologist and Surgeon  Anesthesia Plan Comments: ( )        Anesthesia Quick Evaluation

## 2018-11-28 NOTE — Transfer of Care (Signed)
Immediate Anesthesia Transfer of Care Note  Patient: [name] Brown  Procedure(s) Performed: CARDIOVERSION (N/A )  Patient Location: Endoscopy Unit  Anesthesia Type:General  Level of Consciousness: awake, alert  and oriented  Airway & Oxygen Therapy: Patient Spontanous Breathing  Post-op Assessment: Report given to RN, Post -op Vital signs reviewed and stable and Patient moving all extremities X 4  Post vital signs: Reviewed and stable  Last Vitals:  Vitals Value Taken Time  BP    Temp    Pulse    Resp    SpO2      Last Pain:  Vitals:   11/28/18 1144  TempSrc: Oral  PainSc: 0-No pain         Complications: No apparent anesthesia complications

## 2018-11-28 NOTE — Interval H&P Note (Signed)
History and Physical Interval Note:  11/28/2018 12:37 PM  [name] Brown  has presented today for surgery, with the diagnosis of ATRIAL FIBRILLATION  The various methods of treatment have been discussed with the patient and family. After consideration of risks, benefits and other options for treatment, the patient has consented to  Procedure(s): CARDIOVERSION (N/A) as a surgical intervention .  The patient's history has been reviewed, patient examined, no change in status, stable for surgery.  I have reviewed the patient's chart and labs.  Questions were answered to the patient's satisfaction.     Elouise Munroe

## 2018-11-29 ENCOUNTER — Encounter (HOSPITAL_COMMUNITY): Payer: Self-pay | Admitting: Internal Medicine

## 2018-11-29 DIAGNOSIS — I480 Paroxysmal atrial fibrillation: Secondary | ICD-10-CM | POA: Diagnosis not present

## 2018-11-29 DIAGNOSIS — R079 Chest pain, unspecified: Secondary | ICD-10-CM | POA: Diagnosis present

## 2018-11-29 DIAGNOSIS — N400 Enlarged prostate without lower urinary tract symptoms: Secondary | ICD-10-CM | POA: Diagnosis not present

## 2018-11-29 DIAGNOSIS — E785 Hyperlipidemia, unspecified: Secondary | ICD-10-CM | POA: Diagnosis not present

## 2018-11-29 DIAGNOSIS — I10 Essential (primary) hypertension: Secondary | ICD-10-CM

## 2018-11-29 DIAGNOSIS — I2510 Atherosclerotic heart disease of native coronary artery without angina pectoris: Secondary | ICD-10-CM | POA: Diagnosis present

## 2018-11-29 LAB — RAPID URINE DRUG SCREEN, HOSP PERFORMED
Amphetamines: NOT DETECTED
Barbiturates: NOT DETECTED
Benzodiazepines: NOT DETECTED
Cocaine: NOT DETECTED
Opiates: NOT DETECTED
Tetrahydrocannabinol: POSITIVE — AB

## 2018-11-29 LAB — TROPONIN I
Troponin I: 0.04 ng/mL (ref <0.03)
Troponin I: 0.06 ng/mL (ref <0.03)
Troponin I: 0.06 ng/mL (ref <0.03)

## 2018-11-29 LAB — LIPID PANEL
Cholesterol: 114 mg/dL (ref 0–200)
HDL: 24 mg/dL — ABNORMAL LOW (ref >40)
LDL Cholesterol: 84 mg/dL (ref 0–99)
Total CHOL/HDL Ratio: 4.8 RATIO
Triglycerides: 32 mg/dL (ref <150)
VLDL: 6 mg/dL (ref 0–40)

## 2018-11-29 LAB — HIV ANTIBODY (ROUTINE TESTING W REFLEX): HIV SCREEN 4TH GENERATION: NONREACTIVE

## 2018-11-29 LAB — BRAIN NATRIURETIC PEPTIDE: B NATRIURETIC PEPTIDE 5: 111.3 pg/mL — AB (ref 0.0–100.0)

## 2018-11-29 MED ORDER — ISOSORBIDE MONONITRATE ER 30 MG PO TB24
30.0000 mg | ORAL_TABLET | Freq: Every day | ORAL | Status: DC
  Administered 2018-11-29: 30 mg via ORAL
  Filled 2018-11-29: qty 1

## 2018-11-29 MED ORDER — MULTIVITAMINS PO CAPS: 1.0000 | ORAL_CAPSULE | Freq: Every day | ORAL | Status: DC

## 2018-11-29 MED ORDER — ADULT MULTIVITAMIN W/MINERALS CH
1.0000 | ORAL_TABLET | Freq: Every day | ORAL | Status: DC
  Administered 2018-11-29: 1 via ORAL
  Filled 2018-11-29: qty 1

## 2018-11-29 MED ORDER — TRAZODONE HCL 50 MG PO TABS
50.0000 mg | ORAL_TABLET | Freq: Every day | ORAL | Status: DC
  Administered 2018-11-29: 50 mg via ORAL
  Filled 2018-11-29 (×2): qty 1

## 2018-11-29 MED ORDER — ASPIRIN EC 81 MG PO TBEC
81.0000 mg | DELAYED_RELEASE_TABLET | Freq: Every day | ORAL | Status: DC
  Administered 2018-11-29: 81 mg via ORAL
  Filled 2018-11-29: qty 1

## 2018-11-29 MED ORDER — RAMIPRIL 10 MG PO CAPS
10.0000 mg | ORAL_CAPSULE | Freq: Two times a day (BID) | ORAL | Status: DC
  Administered 2018-11-29 (×2): 10 mg via ORAL
  Filled 2018-11-29 (×3): qty 1

## 2018-11-29 MED ORDER — APIXABAN 5 MG PO TABS
5.0000 mg | ORAL_TABLET | Freq: Two times a day (BID) | ORAL | Status: DC
  Administered 2018-11-29 (×2): 5 mg via ORAL
  Filled 2018-11-29 (×2): qty 1

## 2018-11-29 MED ORDER — NIACIN ER 500 MG PO CPCR
1000.0000 mg | ORAL_CAPSULE | Freq: Two times a day (BID) | ORAL | Status: DC
  Administered 2018-11-29 (×2): 1000 mg via ORAL
  Filled 2018-11-29 (×3): qty 2

## 2018-11-29 MED ORDER — TIZANIDINE HCL 4 MG PO TABS
4.0000 mg | ORAL_TABLET | Freq: Three times a day (TID) | ORAL | Status: DC | PRN
  Filled 2018-11-29: qty 1

## 2018-11-29 MED ORDER — DIVALPROEX SODIUM ER 500 MG PO TB24
500.0000 mg | ORAL_TABLET | Freq: Every day | ORAL | Status: DC
  Administered 2018-11-29: 500 mg via ORAL
  Filled 2018-11-29 (×2): qty 1

## 2018-11-29 MED ORDER — HYDRALAZINE HCL 20 MG/ML IJ SOLN: 5.0000 mg | INTRAMUSCULAR | Status: DC | PRN

## 2018-11-29 MED ORDER — EZETIMIBE 10 MG PO TABS
10.0000 mg | ORAL_TABLET | Freq: Every day | ORAL | Status: DC
  Administered 2018-11-29: 10 mg via ORAL
  Filled 2018-11-29: qty 1

## 2018-11-29 MED ORDER — NITROGLYCERIN 0.4 MG SL SUBL: 0.4000 mg | SUBLINGUAL_TABLET | SUBLINGUAL | Status: DC | PRN

## 2018-11-29 MED ORDER — DOXYCYCLINE HYCLATE 50 MG PO CAPS
50.0000 mg | ORAL_CAPSULE | Freq: Every day | ORAL | Status: DC
  Administered 2018-11-29: 50 mg via ORAL
  Filled 2018-11-29 (×2): qty 1

## 2018-11-29 MED ORDER — HYDRALAZINE HCL 50 MG PO TABS
50.0000 mg | ORAL_TABLET | Freq: Two times a day (BID) | ORAL | Status: DC
  Administered 2018-11-29 (×2): 50 mg via ORAL
  Filled 2018-11-29 (×2): qty 1

## 2018-11-29 MED ORDER — ACETAMINOPHEN 325 MG PO TABS: 650.0000 mg | ORAL_TABLET | ORAL | Status: DC | PRN

## 2018-11-29 MED ORDER — GABAPENTIN 100 MG PO CAPS: 200.0000 mg | ORAL_CAPSULE | Freq: Every day | ORAL | Status: DC

## 2018-11-29 MED ORDER — GLUCOSAMINE-CHONDROITIN 500-400 MG PO CAPS: ORAL_CAPSULE | Freq: Two times a day (BID) | ORAL | Status: DC

## 2018-11-29 MED ORDER — MORPHINE SULFATE (PF) 2 MG/ML IV SOLN: 2.0000 mg | INTRAVENOUS | Status: DC | PRN

## 2018-11-29 MED ORDER — OMEGA-3-ACID ETHYL ESTERS 1 G PO CAPS
1.0000 g | ORAL_CAPSULE | Freq: Every day | ORAL | Status: DC
  Administered 2018-11-29: 1 g via ORAL
  Filled 2018-11-29: qty 1

## 2018-11-29 MED ORDER — VITAMIN C 500 MG PO TABS
500.0000 mg | ORAL_TABLET | Freq: Every day | ORAL | Status: DC
  Administered 2018-11-29: 500 mg via ORAL
  Filled 2018-11-29: qty 1

## 2018-11-29 MED ORDER — VITAMIN D3 25 MCG (1000 UNIT) PO TABS
1000.0000 [IU] | ORAL_TABLET | Freq: Every day | ORAL | Status: DC
  Administered 2018-11-29: 1000 [IU] via ORAL
  Filled 2018-11-29 (×3): qty 1

## 2018-11-29 MED ORDER — TAMSULOSIN HCL 0.4 MG PO CAPS
0.4000 mg | ORAL_CAPSULE | Freq: Every day | ORAL | Status: DC
  Administered 2018-11-29: 0.4 mg via ORAL
  Filled 2018-11-29: qty 1

## 2018-11-29 MED ORDER — FUROSEMIDE 20 MG PO TABS
20.0000 mg | ORAL_TABLET | Freq: Two times a day (BID) | ORAL | Status: DC
  Administered 2018-11-29: 20 mg via ORAL
  Filled 2018-11-29: qty 1

## 2018-11-29 MED ORDER — APIXABAN 5 MG PO TABS: 5.0000 mg | ORAL_TABLET | Freq: Once | ORAL | Status: DC

## 2018-11-29 MED ORDER — ONDANSETRON HCL 4 MG/2ML IJ SOLN
4.0000 mg | Freq: Four times a day (QID) | INTRAMUSCULAR | Status: DC | PRN
  Filled 2018-11-29: qty 2

## 2018-11-29 NOTE — Plan of Care (Signed)
°  Problem: Clinical Measurements: °Goal: Ability to maintain clinical measurements within normal limits will improve °Outcome: Progressing °Goal: Diagnostic test results will improve °Outcome: Progressing °Goal: Cardiovascular complication will be avoided °Outcome: Progressing °  °

## 2018-11-29 NOTE — Progress Notes (Addendum)
Patient yelling at nursing staff stating he needs to speak to cardiologist he has been waiting over 16hours, Attempted to educate patient he will be seen within 24hours. Patient refused to take medications until seen by cardiologist due to nausea from not eating. Sent message to attending and card team.   Patient was seen by card, given diet order, and will monitor overnight. Tolerated meds well.

## 2018-11-29 NOTE — Progress Notes (Signed)
Sent page to team for trazadone for sleep aid per patient request

## 2018-11-29 NOTE — ED Notes (Signed)
ED TO INPATIENT HANDOFF REPORT  ED Nurse Name and Phone #:  Joellen Jersey [phone]  S Name/Age/Gender [name] Brown 68 y.o. male Room/Bed: 026C/026C  Code Status   Code Status: Full Code  Home/SNF/Other Home Patient oriented to: self, place, time and situation Is this baseline? Yes   Triage Complete: Triage complete  Chief Complaint CP  Triage Note [name] for chest pain. Patient was recently d/c from this facility today after a cardioversion. Started c/o central chest pain around 2100 tonight with increased SOB. Given 324 mg aspirin and 2 NTG. Patient is pain free at this time. Denies nausea, vomiting, or abdominal pain.   BP 162/99  HR 90 98% 2L      Allergies Allergies  Allergen Reactions  . Statins     Myalgias with elevated CPKs    Level of Care/Admitting Diagnosis ED Disposition    ED Disposition Condition [facility] Area: [place] [[id number]]  Level of Care: Cardiac Telemetry [103]  I expect the patient will be discharged within 24 hours: No (not a candidate for 5C-Observation unit)  Diagnosis: Chest pain [295284]  Admitting Physician: Ivor Costa [[id number]]  Attending Physician: Ivor Costa [[id number]]  PT Class (Do Not Modify): Observation [104]  PT Acc Code (Do Not Modify): Observation [10022]       B Medical/Surgery History Past Medical History:  Diagnosis Date  . Aortic aneurysm ([place]) Endograf 2009  . Arthritis   . Atrial fibrillation ([place])   . Bradycardia   . CAD (coronary artery disease) CABG 03/2000   Median sternotomy for coronary artery bypass grafting x 3 (left  . HTN (hypertension)   . Hx of CABG 2001   severe 3 vessel disease  . Hx of colonic polyp 06/2010   Hyperplastic   . Hyperlipidemia   . Myocardial infarction [facility])    Past Surgical History:  Procedure Laterality Date  . COLONOSCOPY    . CORONARY ARTERY BYPASS GRAFT     AAA stent graft on 12/01/2007 at [facility]  coronary  bypass in 2001. Dr. Roxy Manns.  Marland Kitchen FOOT SURGERY Left 07/19/2017  . LEFT HEART CATHETERIZATION WITH CORONARY ANGIOGRAM N/A 10/26/2013   Procedure: LEFT HEART CATHETERIZATION WITH CORONARY ANGIOGRAM;  Surgeon: Peter M Martinique, MD;  Location: [facility] CATH LAB;  Service: Cardiovascular;  Laterality: N/A;     A IV Location/Drains/Wounds Patient Lines/Drains/Airways Status   Active Line/Drains/Airways    Name:   Placement date:   Placement time:   Site:   Days:   Peripheral IV 11/28/18 Right Forearm   11/28/18    2218    Forearm   1   Incision 10/26/13 Groin Right   10/26/13    1945     1860          Intake/Output Last 24 hours No intake or output data in the 24 hours ending 11/29/18 0710  Labs/Imaging Results for orders placed or performed during the hospital encounter of 11/28/18 (from the past 48 hour(s))  Comprehensive metabolic panel     Status: Abnormal   Collection Time: 11/28/18 10:16 PM  Result Value Ref Range   Sodium 137 135 - 145 mmol/L   Potassium 4.0 3.5 - 5.1 mmol/L   Chloride 104 98 - 111 mmol/L   CO2 21 (L) 22 - 32 mmol/L   Glucose, Bld 144 (H) 70 - 99 mg/dL   BUN 22 8 - 23 mg/dL   Creatinine, Ser 1.10 0.61 -  1.24 mg/dL   Calcium 9.3 8.9 - 10.3 mg/dL   Total Protein 6.5 6.5 - 8.1 g/dL   Albumin 3.8 3.5 - 5.0 g/dL   AST 26 15 - 41 U/L   ALT 21 0 - 44 U/L   Alkaline Phosphatase 46 38 - 126 U/L   Total Bilirubin 0.9 0.3 - 1.2 mg/dL   GFR calc non Af Amer >60 >60 mL/min   GFR calc Af Amer >60 >60 mL/min   Anion gap 12 5 - 15    Comment: Performed at [facility], [place] [address], [place], [place] [address]  Lipase, blood     Status: None   Collection Time: 11/28/18 10:16 PM  Result Value Ref Range   Lipase 40 11 - 51 U/L    Comment: Performed at [facility] [address] [place], [place] [address]  Troponin I - Once     Status: None   Collection Time: 11/28/18 10:16 PM  Result Value Ref Range   Troponin I <0.03 <0.03 ng/mL    Comment: Performed at [facility] [address], [place], [place] [address]  CBC with Differential     Status: Abnormal   Collection Time: 11/28/18 10:16 PM  Result Value Ref Range   WBC 6.6 4.0 - 10.5 K/uL   RBC 4.06 (L) 4.22 - 5.81 MIL/uL   Hemoglobin 13.1 13.0 - 17.0 g/dL   HCT 40.6 39.0 - 52.0 %   MCV 100.0 80.0 - 100.0 fL   MCH 32.3 26.0 - 34.0 pg   MCHC 32.3 30.0 - 36.0 g/dL   RDW 14.2 11.5 - 15.5 %   Platelets 177 150 - 400 K/uL   nRBC 0.0 0.0 - 0.2 %   Neutrophils Relative % 52 %   Neutro Abs 3.6 1.7 - 7.7 K/uL   Lymphocytes Relative 31 %   Lymphs Abs 2.0 0.7 - 4.0 K/uL   Monocytes Relative 10 %   Monocytes Absolute 0.7 0.1 - 1.0 K/uL   Eosinophils Relative 5 %   Eosinophils Absolute 0.3 0.0 - 0.5 K/uL   Basophils Relative 1 %   Basophils Absolute 0.0 0.0 - 0.1 K/uL   Immature Granulocytes 1 %   Abs Immature Granulocytes 0.03 0.00 - 0.07 K/uL    Comment: Performed at [facility] [address], [place], [place] [address]  Protime-INR     Status: Abnormal   Collection Time: 11/28/18 10:16 PM  Result Value Ref Range   Prothrombin Time 15.7 (H) 11.4 - 15.2 seconds   INR 1.26     Comment: Performed at [facility] [address] [place], [place] [address]  Brain natriuretic peptide     Status: Abnormal   Collection Time: 11/28/18 10:59 PM  Result Value Ref Range   B Natriuretic Peptide 111.3 (H) 0.0 - 100.0 pg/mL    Comment: Performed at [facility] [address] [place], [place] [address]  Rapid urine drug screen (hospital performed)     Status: Abnormal   Collection Time: 11/29/18  2:10 AM  Result Value Ref Range   Opiates NONE DETECTED NONE DETECTED   Cocaine NONE DETECTED NONE DETECTED   Benzodiazepines NONE DETECTED NONE DETECTED   Amphetamines NONE DETECTED NONE DETECTED   Tetrahydrocannabinol POSITIVE (A) NONE DETECTED   Barbiturates NONE DETECTED NONE DETECTED    Comment: (NOTE) DRUG SCREEN FOR MEDICAL PURPOSES ONLY.  IF CONFIRMATION IS NEEDED FOR  ANY  PURPOSE, NOTIFY LAB WITHIN 5 DAYS. LOWEST DETECTABLE LIMITS FOR URINE DRUG SCREEN Drug Class                     Cutoff (ng/mL) Amphetamine and metabolites    1000 Barbiturate and metabolites    200 Benzodiazepine                 789 Tricyclics and metabolites     300 Opiates and metabolites        300 Cocaine and metabolites        300 THC                            50 Performed at [facility], [place] [address], [place], [place] [address]   Lipid panel     Status: Abnormal   Collection Time: 11/29/18  2:23 AM  Result Value Ref Range   Cholesterol 114 0 - 200 mg/dL   Triglycerides 32 <150 mg/dL   HDL 24 (L) >40 mg/dL   Total CHOL/HDL Ratio 4.8 RATIO   VLDL 6 0 - 40 mg/dL   LDL Cholesterol 84 0 - 99 mg/dL    Comment:        Total Cholesterol/HDL:CHD Risk Coronary Heart Disease Risk Table                     Men   Women  1/2 Average Risk   3.4   3.3  Average Risk       5.0   4.4  2 X Average Risk   9.6   7.1  3 X Average Risk  23.4   11.0        Use the calculated Patient Ratio above and the CHD Risk Table to determine the patient's CHD Risk.        ATP III CLASSIFICATION (LDL):  <100     mg/dL   Optimal  100-129  mg/dL   Near or Above                    Optimal  130-159  mg/dL   Borderline  160-189  mg/dL   High  >190     mg/dL   Very High Performed at [facility] [address] [place], [place] [address]   Troponin I - Now Then Q6H     Status: Abnormal   Collection Time: 11/29/18  2:23 AM  Result Value Ref Range   Troponin I 0.04 (HH) <0.03 ng/mL    Comment: CRITICAL RESULT CALLED TO, READ BACK BY AND VERIFIED WITH: Rich Reining [facility] 11/29/18 0320 WAYK Performed at [facility] [address], [place], [place] [address]    Dg Chest 2 View  Result Date: 11/28/2018 CLINICAL DATA:  Chest pain EXAM: CHEST - 2 VIEW COMPARISON:  10/26/2018, 10/06/2015 FINDINGS: Post sternotomy changes. Cardiomegaly. Scarring at the bases. No pleural effusion. No  pneumothorax. IMPRESSION: No active cardiopulmonary disease. Cardiomegaly. Streaky opacity at the lung bases which may reflect scarring or mild fibrosis. Electronically Signed   By: Donavan Foil M.D.   On: 11/28/2018 23:27    Pending Labs Unresulted Labs (From admission, onward)    Start     Ordered   11/29/18 0500  Hemoglobin A1c  Tomorrow morning,   R     11/29/18 0145   11/29/18 0147  HIV antibody (Routine Testing)  Once,   R  11/29/18 0147   11/29/18 0146  Troponin I - Now Then Q6H  Now then every 6 hours,   R     11/29/18 0145          Vitals/Pain Today's Vitals   11/29/18 0222 11/29/18 0226 11/29/18 0400 11/29/18 0436  BP: (!) 103/58  (!) 99/57   Pulse: (!) 56  60   Resp: 10  17   Temp:      TempSrc:      SpO2: 93%  92%   Weight:      Height:      PainSc:  0-No pain  0-No pain    Isolation Precautions No active isolations  Medications Medications  aspirin EC tablet 81 mg (has no administration in time range)  Doxycycline Hyclate TABS 50 mg (has no administration in time range)  ezetimibe (ZETIA) tablet 10 mg (has no administration in time range)  furosemide (LASIX) tablet 20 mg (has no administration in time range)  hydrALAZINE (APRESOLINE) tablet 50 mg (has no administration in time range)  isosorbide mononitrate (IMDUR) 24 hr tablet 30 mg (has no administration in time range)  niacin CR capsule 1,000 mg (1,000 mg Oral Given 11/29/18 0435)  nitroGLYCERIN (NITROSTAT) SL tablet 0.4 mg (has no administration in time range)  ramipril (ALTACE) capsule 10 mg (has no administration in time range)  traZODone (DESYREL) tablet 50 mg (50 mg Oral Refused 11/29/18 0436)  tamsulosin (FLOMAX) capsule 0.4 mg (has no administration in time range)  apixaban (ELIQUIS) tablet 5 mg (has no administration in time range)  divalproex (DEPAKOTE ER) 24 hr tablet 500 mg (has no administration in time range)  gabapentin (NEURONTIN) capsule 200 mg (has no administration in time range)   tiZANidine (ZANAFLEX) tablet 4 mg (has no administration in time range)  cholecalciferol (VITAMIN D) tablet 1,000 Units (has no administration in time range)  multivitamin capsule 1 capsule (has no administration in time range)  omega-3 acid ethyl esters (LOVAZA) capsule 1 g (has no administration in time range)  vitamin C (ASCORBIC ACID) tablet 500 mg (has no administration in time range)  morphine 2 MG/ML injection 2 mg (has no administration in time range)  acetaminophen (TYLENOL) tablet 650 mg (has no administration in time range)  ondansetron (ZOFRAN) injection 4 mg (has no administration in time range)  hydrALAZINE (APRESOLINE) injection 5 mg (has no administration in time range)    Mobility walks Low fall risk   Focused Assessments Cardiac Assessment Handoff:  Cardiac Rhythm: Normal sinus rhythm Lab Results  Component Value Date   CKTOTAL 102 02/21/2015   TROPONINI 0.04 (HH) 11/29/2018   No results found for: DDIMER Does the Patient currently have chest pain? No     R Recommendations: See Admitting Provider Note  Report given to:   Additional Notes:  Patient is NPO for cards consult. Denies chest pain at this time.

## 2018-11-29 NOTE — ED Provider Notes (Signed)
[facility] EMERGENCY DEPARTMENT Provider Note   CSN: [id number] Arrival date & time: 11/28/18  2208    History   Chief Complaint Chief Complaint  Patient presents with  . Chest Pain    HPI [name] Brown is a 68 y.o. male.     HPI Patient with known coronary artery disease.  Patient had elective cardioversion today.  He sees Dr. Mariah Milling.  Patient takes Eliquis.  He reports he felt well after the cardioversion.  Later this evening however he acutely developed chest pressure and pain in the left chest.  He reports he took 1 of his nitroglycerin which helped bring it down to about a 4 out of 10.  He called EMS.  EMS gave a second nitroglycerin.  He reports chest pain relieved at that time.  He did not have syncope.  He does not perceive palpitation. Past Medical History:  Diagnosis Date  . Aortic aneurysm ([place]) Endograf 2009  . Arthritis   . Atrial fibrillation ([place])   . Bradycardia   . CAD (coronary artery disease) CABG 03/2000   Median sternotomy for coronary artery bypass grafting x 3 (left  . HTN (hypertension)   . Hx of CABG 2001   severe 3 vessel disease  . Hx of colonic polyp 06/2010   Hyperplastic   . Hyperlipidemia   . Myocardial infarction [facility])     Patient Active Problem List   Diagnosis Date Noted  . HLD (hyperlipidemia) 11/29/2018  . CAD (coronary artery disease) 11/29/2018  . Chest pain 11/29/2018  . Scalp cyst 10/25/2018  . Degenerative disc disease, lumbar 03/09/2018  . Routine general medical examination at a health care facility 09/22/2017  . BPH without urinary obstruction 07/22/2016  . Anemia, iron deficiency 07/11/2015  . Nonallopathic lesion-rib cage 05/29/2015  . Nonallopathic lesion of thoracic region 05/29/2015  . Nonallopathic lesion of cervical region 05/29/2015  . AV block, 2nd degree 10/29/2013  . Migraine- chronic Verapamil 10/29/2013  . Paroxysmal atrial fibrillation ([place]) 10/26/2013  . Carotid bruit- 74-25% LICA by  doppler 9/56 03/14/2012  . ALCOHOL USE 01/23/2009  . Elevated lipids 01/22/2009  . Essential hypertension 01/22/2009  . Bradycardia 01/22/2009  . Aortic aneurysm ([place]) 01/22/2009    Past Surgical History:  Procedure Laterality Date  . COLONOSCOPY    . CORONARY ARTERY BYPASS GRAFT     AAA stent graft on 12/01/2007 at [facility]  coronary bypass in 2001. Dr. Roxy Manns.  Marland Kitchen FOOT SURGERY Left 07/19/2017  . LEFT HEART CATHETERIZATION WITH CORONARY ANGIOGRAM N/A 10/26/2013   Procedure: LEFT HEART CATHETERIZATION WITH CORONARY ANGIOGRAM;  Surgeon: Peter M Martinique, MD;  Location: [facility] CATH LAB;  Service: Cardiovascular;  Laterality: N/A;        Home Medications    Prior to Admission medications   Medication Sig Start Date End Date Taking? Authorizing Provider  apixaban (ELIQUIS) 5 MG TABS tablet Take 1 tablet (5 mg total) by mouth 2 (two) times daily. 10/26/18  Yes Josue Hector, MD  aspirin EC 81 MG EC tablet Take 1 tablet (81 mg total) by mouth daily. 10/29/13  Yes Kilroy, Doreene Burke, PA-C  cholecalciferol (VITAMIN D) 25 MCG (1000 UT) tablet Take 1,000 Units by mouth daily.    Yes [provider]  divalproex (DEPAKOTE ER) 500 MG 24 hr tablet Take 1 tablet (500 mg total) by mouth daily. NEEDS ANNUAL VISIT FOR FURTHER REFILLS 10/24/18  Yes Hoyt Koch, MD  Doxycycline Hyclate 50 MG  TABS Take 50 mg by mouth daily.    Yes [provider]  ezetimibe (ZETIA) 10 MG tablet Take 1 tablet (10 mg total) by mouth daily. 10/23/18  Yes Josue Hector, MD  furosemide (LASIX) 20 MG tablet Take 1 tablet (20 mg total) by mouth 2 (two) times daily. 11/22/18  Yes Josue Hector, MD  gabapentin (NEURONTIN) 100 MG capsule TAKE 2 CAPSULES(200 MG) BY MOUTH AT BEDTIME Patient taking differently: Take 200 mg by mouth at bedtime. TAKE 2 CAPSULES(200 MG) BY MOUTH AT BEDTIME 03/09/18  Yes Hulan Saas M, DO  Glucosamine-Chondroitin (GLUCOSAMINE CHONDR COMPLEX PO) Take 500 mg by mouth  2 (two) times daily.    Yes [provider]  hydrALAZINE (APRESOLINE) 50 MG tablet TAKE 1 TABLET(50 MG) BY MOUTH TWICE DAILY Patient taking differently: Take 50 mg by mouth 2 (two) times daily.  08/22/18  Yes Josue Hector, MD  isosorbide mononitrate (IMDUR) 30 MG 24 hr tablet TAKE 1 TABLET BY MOUTH EVERY DAY Patient taking differently: Take 30 mg by mouth daily.  08/08/18  Yes Josue Hector, MD  Multiple Vitamin (MULTIVITAMIN) capsule Take 1 capsule by mouth daily.   Yes [provider]  niacin (NIASPAN) 1000 MG CR tablet Take 1,000 mg by mouth 2 (two) times daily.     Yes [provider]  nitroGLYCERIN (NITROSTAT) 0.4 MG SL tablet DISSOLVE 1 TABLET UNDER THE TONGUE EVERY 5 MINUTES FOR 3 DOSES AS NEEDED FOR CHEST PAIN Patient taking differently: Place 0.4 mg under the tongue every 5 (five) minutes as needed for chest pain. DISSOLVE 1 TABLET UNDER THE TONGUE EVERY 5 MINUTES FOR 3 DOSES AS NEEDED FOR CHEST PAIN 11/01/18  Yes Josue Hector, MD  Omega-3 Fatty Acids (FISH OIL) 1200 MG CAPS Take 1,200 mg by mouth 2 (two) times daily.   Yes [provider]  potassium chloride SA (K-DUR,KLOR-CON) 20 MEQ tablet TAKE 1 TABLET BY MOUTH EVERY DAY Patient taking differently: Take 20 mEq by mouth daily.  09/28/18  Yes Josue Hector, MD  ramipril (ALTACE) 10 MG capsule TAKE 1 CAPSULE BY MOUTH TWICE DAILY Patient taking differently: Take 10 mg by mouth 2 (two) times daily.  09/28/18  Yes Josue Hector, MD  tamsulosin (FLOMAX) 0.4 MG CAPS capsule Take 1 capsule (0.4 mg total) by mouth daily. Need annual exam with labs for further refills 09/11/18  Yes Hoyt Koch, MD  tiZANidine (ZANAFLEX) 4 MG tablet Take 1 tablet (4 mg total) by mouth every 8 (eight) hours as needed for muscle spasms. 03/09/18  Yes Lyndal Pulley, DO  traZODone (DESYREL) 50 MG tablet TAKE 1/2 TO 1 TABLET(25 TO 50 MG) BY MOUTH AT BEDTIME AS NEEDED FOR SLEEP Patient taking differently: Take 50  mg by mouth at bedtime.  11/02/18  Yes Hoyt Koch, MD  vitamin C (ASCORBIC ACID) 500 MG tablet Take 500 mg by mouth daily.   Yes [provider]    Family History Family History  Problem Relation Age of Onset  . Heart attack Mother   . Heart disease Mother   . Heart attack Father   . Heart disease Father   . Rectal cancer Neg Hx   . Stomach cancer Neg Hx   . Pancreatic cancer Neg Hx   . Esophageal cancer Neg Hx   . Colon polyps Neg Hx   . Colon cancer Neg Hx     Social History Social History   Tobacco Use  .  Smoking status: Former Smoker    Packs/day: 2.00    Years: 30.00    Pack years: 60.00    Types: Cigarettes    Last attempt to quit: 01/05/1992    Years since quitting: 26.9  . Smokeless tobacco: Never Used  . Tobacco comment: quit 19 yrs ago  Substance Use Topics  . Alcohol use: No  . Drug use: No     Allergies   Statins   Review of Systems Review of Systems 10 Systems reviewed and are negative for acute change except as noted in the HPI.  Physical Exam Updated Vital Signs BP 108/61   Pulse 63   Temp (!) 97.5 F (36.4 C) (Oral)   Resp 15   Ht 5\' 10"  (1.778 m)   Wt 90.7 kg   SpO2 93%   BMI 28.70 kg/m   Physical Exam Constitutional:      Appearance: He is well-developed.  HENT:     Head: Normocephalic and atraumatic.  Eyes:     Pupils: Pupils are equal, round, and reactive to light.  Neck:     Musculoskeletal: Neck supple.  Cardiovascular:     Rate and Rhythm: Normal rate and regular rhythm.     Heart sounds: Normal heart sounds.  Pulmonary:     Effort: Pulmonary effort is normal.     Breath sounds: Normal breath sounds.  Abdominal:     General: Bowel sounds are normal. There is no distension.     Palpations: Abdomen is soft.     Tenderness: There is no abdominal tenderness.  Musculoskeletal: Normal range of motion.  Skin:    General: Skin is warm and dry.  Neurological:     Mental Status: He is alert and oriented  to person, place, and time.     GCS: GCS eye subscore is 4. GCS verbal subscore is 5. GCS motor subscore is 6.     Coordination: Coordination normal.      ED Treatments / Results  Labs (all labs ordered are listed, but only abnormal results are displayed) Labs Reviewed  COMPREHENSIVE METABOLIC PANEL - Abnormal; Notable for the following components:      Result Value   CO2 21 (*)    Glucose, Bld 144 (*)    All other components within normal limits  BRAIN NATRIURETIC PEPTIDE - Abnormal; Notable for the following components:   B Natriuretic Peptide 111.3 (*)    All other components within normal limits  CBC WITH DIFFERENTIAL/PLATELET - Abnormal; Notable for the following components:   RBC 4.06 (*)    All other components within normal limits  PROTIME-INR - Abnormal; Notable for the following components:   Prothrombin Time 15.7 (*)    All other components within normal limits  LIPASE, BLOOD  TROPONIN I    EKG EKG Interpretation  Date/Time:  Tuesday November 28 2018 22:14:45 EST Ventricular Rate:  78 PR Interval:    QRS Duration: 115 QT Interval:  422 QTC Calculation: 481 R Axis:   5 Text Interpretation:  Sinus rhythm Prolonged PR interval Nonspecific intraventricular conduction delay Borderline low voltage, extremity leads no ischemic change from prior Confirmed by Charlesetta Shanks [phone]) on 11/28/2018 10:38:41 PM   Radiology Dg Chest 2 View  Result Date: 11/28/2018 CLINICAL DATA:  Chest pain EXAM: CHEST - 2 VIEW COMPARISON:  10/26/2018, 10/06/2015 FINDINGS: Post sternotomy changes. Cardiomegaly. Scarring at the bases. No pleural effusion. No pneumothorax. IMPRESSION: No active cardiopulmonary disease. Cardiomegaly. Streaky opacity at the lung bases which may  reflect scarring or mild fibrosis. Electronically Signed   By: Donavan Foil M.D.   On: 11/28/2018 23:27    Procedures Procedures (including critical care time)  Medications Ordered in ED Medications    nitroGLYCERIN (NITROGLYN) 2 % ointment 1 inch (1 inch Topical Given 11/29/18 0052)     Initial Impression / Assessment and Plan / ED Course  I have reviewed the triage vital signs and the nursing notes.  Pertinent labs & imaging results that were available during my care of the patient were reviewed by me and considered in my medical decision making (see chart for details).       Consult: Reviewed Dr. Donna Bernard for admission.  Patient has known coronary artery disease.  He had episode of chest pain today resolved by nitroglycerin.  Patient is anticoagulated.  He had elective cardioversion today without apparent complications.  At this time with history of coronary artery disease will have admitted for rule out and observation.  Patient is pain-free at this time.  Final Clinical Impressions(s) / ED Diagnoses   Final diagnoses:  Precordial pain    ED Discharge Orders    None       Charlesetta Shanks, MD 11/29/18 0140

## 2018-11-29 NOTE — H&P (Signed)
History and Physical    [name] Brown OIN:[id number] DOB: [birth year] DOA: 11/28/2018  Referring MD/NP/PA:   PCP: Hoyt Koch, MD   Patient coming from:  The patient is coming from home.  At baseline, pt is independent for most of ADL.        Chief Complaint: Chest pain  HPI: [name] Brown is a 68 y.o. male with medical history significant of hypertension, hyperlipidemia, aortic aneurysm (endograft 2009), atrial fibrillation on Eliquis, CAD, CABG, alcohol abuse in remission, BPH, who presents with chest pain.  Patient underwent elective atrial fibrillation cardioversion earlier today.  Patient tolerated procedure well.  Patient has been feeling normal after the procedure until 9 PM when he started having chest pain.  The chest pain is located in the central chest, initially 9 out of 10 in severity, tightness pressure-like pain, nonradiating.  It was associated with shortness breath.  No cough, fever or chills.  Patient took aspirin and nitroglycerin, chest pain has resolved currently.  Patient denies nausea, vomiting, diarrhea, abdominal pain, symptoms of UTI or unilateral weakness.  ED Course: pt was found to have negative troponin, INR 1.26, BNP 111.3, electrolytes renal function okay, temperature 97.5, bradycardia, oxygen sat 92 to 93% on room air.  Chest x-ray showed cardiomegaly and basilar scarring.  Patient is placed on telemetry bed for observation.  Review of Systems:   General: no fevers, chills, no body weight gain, has fatigue HEENT: no blurry vision, hearing changes or sore throat Respiratory: has dyspnea, coughing, no wheezing CV: has chest pain, no palpitations GI: no nausea, vomiting, abdominal pain, diarrhea, constipation GU: no dysuria, burning on urination, increased urinary frequency, hematuria  Ext: no leg edema Neuro: no unilateral weakness, numbness, or tingling, no vision change or hearing loss Skin: no rash, no skin tear. MSK: No muscle spasm, no  deformity, no limitation of range of movement in spin Heme: No easy bruising.  Travel history: No recent long distant travel.  Allergy:  Allergies  Allergen Reactions  . Statins     Myalgias with elevated CPKs    Past Medical History:  Diagnosis Date  . Aortic aneurysm ([place]) Endograf 2009  . Arthritis   . Atrial fibrillation ([place])   . Bradycardia   . CAD (coronary artery disease) CABG 03/2000   Median sternotomy for coronary artery bypass grafting x 3 (left  . HTN (hypertension)   . Hx of CABG 2001   severe 3 vessel disease  . Hx of colonic polyp 06/2010   Hyperplastic   . Hyperlipidemia   . Myocardial infarction [facility])     Past Surgical History:  Procedure Laterality Date  . COLONOSCOPY    . CORONARY ARTERY BYPASS GRAFT     AAA stent graft on 12/01/2007 at [facility]  coronary bypass in 2001. Dr. Roxy Manns.  Marland Kitchen FOOT SURGERY Left 07/19/2017  . LEFT HEART CATHETERIZATION WITH CORONARY ANGIOGRAM N/A 10/26/2013   Procedure: LEFT HEART CATHETERIZATION WITH CORONARY ANGIOGRAM;  Surgeon: Peter M Martinique, MD;  Location: [facility] CATH LAB;  Service: Cardiovascular;  Laterality: N/A;    Social History:  reports that he quit smoking about 26 years ago. His smoking use included cigarettes. He has a 60.00 pack-year smoking history. He has never used smokeless tobacco. He reports that he does not drink alcohol or use drugs.  Family History:  Family History  Problem Relation Age of Onset  . Heart attack Mother   . Heart disease Mother   . Heart attack  Father   . Heart disease Father   . Rectal cancer Neg Hx   . Stomach cancer Neg Hx   . Pancreatic cancer Neg Hx   . Esophageal cancer Neg Hx   . Colon polyps Neg Hx   . Colon cancer Neg Hx      Prior to Admission medications   Medication Sig Start Date End Date Taking? Authorizing Provider  apixaban (ELIQUIS) 5 MG TABS tablet Take 1 tablet (5 mg total) by mouth 2 (two) times daily. 10/26/18  Yes Josue Hector, MD    aspirin EC 81 MG EC tablet Take 1 tablet (81 mg total) by mouth daily. 10/29/13  Yes Kilroy, Doreene Burke, PA-C  cholecalciferol (VITAMIN D) 25 MCG (1000 UT) tablet Take 1,000 Units by mouth daily.    Yes [provider]  divalproex (DEPAKOTE ER) 500 MG 24 hr tablet Take 1 tablet (500 mg total) by mouth daily. NEEDS ANNUAL VISIT FOR FURTHER REFILLS 10/24/18  Yes Hoyt Koch, MD  Doxycycline Hyclate 50 MG TABS Take 50 mg by mouth daily.    Yes [provider]  ezetimibe (ZETIA) 10 MG tablet Take 1 tablet (10 mg total) by mouth daily. 10/23/18  Yes Josue Hector, MD  furosemide (LASIX) 20 MG tablet Take 1 tablet (20 mg total) by mouth 2 (two) times daily. 11/22/18  Yes Josue Hector, MD  gabapentin (NEURONTIN) 100 MG capsule TAKE 2 CAPSULES(200 MG) BY MOUTH AT BEDTIME Patient taking differently: Take 200 mg by mouth at bedtime. TAKE 2 CAPSULES(200 MG) BY MOUTH AT BEDTIME 03/09/18  Yes Hulan Saas M, DO  Glucosamine-Chondroitin (GLUCOSAMINE CHONDR COMPLEX PO) Take 500 mg by mouth 2 (two) times daily.    Yes [provider]  hydrALAZINE (APRESOLINE) 50 MG tablet TAKE 1 TABLET(50 MG) BY MOUTH TWICE DAILY Patient taking differently: Take 50 mg by mouth 2 (two) times daily.  08/22/18  Yes Josue Hector, MD  isosorbide mononitrate (IMDUR) 30 MG 24 hr tablet TAKE 1 TABLET BY MOUTH EVERY DAY Patient taking differently: Take 30 mg by mouth daily.  08/08/18  Yes Josue Hector, MD  Multiple Vitamin (MULTIVITAMIN) capsule Take 1 capsule by mouth daily.   Yes [provider]  niacin (NIASPAN) 1000 MG CR tablet Take 1,000 mg by mouth 2 (two) times daily.     Yes [provider]  nitroGLYCERIN (NITROSTAT) 0.4 MG SL tablet DISSOLVE 1 TABLET UNDER THE TONGUE EVERY 5 MINUTES FOR 3 DOSES AS NEEDED FOR CHEST PAIN Patient taking differently: Place 0.4 mg under the tongue every 5 (five) minutes as needed for chest pain. DISSOLVE 1 TABLET UNDER THE TONGUE EVERY 5  MINUTES FOR 3 DOSES AS NEEDED FOR CHEST PAIN 11/01/18  Yes Josue Hector, MD  Omega-3 Fatty Acids (FISH OIL) 1200 MG CAPS Take 1,200 mg by mouth 2 (two) times daily.   Yes [provider]  potassium chloride SA (K-DUR,KLOR-CON) 20 MEQ tablet TAKE 1 TABLET BY MOUTH EVERY DAY Patient taking differently: Take 20 mEq by mouth daily.  09/28/18  Yes Josue Hector, MD  ramipril (ALTACE) 10 MG capsule TAKE 1 CAPSULE BY MOUTH TWICE DAILY Patient taking differently: Take 10 mg by mouth 2 (two) times daily.  09/28/18  Yes Josue Hector, MD  tamsulosin (FLOMAX) 0.4 MG CAPS capsule Take 1 capsule (0.4 mg total) by mouth daily. Need annual exam with labs for further refills 09/11/18  Yes Hoyt Koch, MD  tiZANidine (ZANAFLEX) 4 MG  tablet Take 1 tablet (4 mg total) by mouth every 8 (eight) hours as needed for muscle spasms. 03/09/18  Yes Lyndal Pulley, DO  traZODone (DESYREL) 50 MG tablet TAKE 1/2 TO 1 TABLET(25 TO 50 MG) BY MOUTH AT BEDTIME AS NEEDED FOR SLEEP Patient taking differently: Take 50 mg by mouth at bedtime.  11/02/18  Yes Hoyt Koch, MD  vitamin C (ASCORBIC ACID) 500 MG tablet Take 500 mg by mouth daily.   Yes [provider]    Physical Exam: Vitals:   11/28/18 2212 11/29/18 0043 11/29/18 0051  BP: 133/72 (!) 99/54 108/61  Pulse: 80 61 63  Resp: (!) 22 12 15   Temp: (!) 97.5 F (36.4 C)    TempSrc: Oral    SpO2: 95% 92% 93%  Weight: 90.7 kg    Height: 5\' 10"  (1.778 m)     General: Not in acute distress HEENT:       Eyes: PERRL, EOMI, no scleral icterus.       ENT: No discharge from the ears and nose, no pharynx injection, no tonsillar enlargement.        Neck: No JVD, no bruit, no mass felt. Heme: No neck lymph node enlargement. Cardiac: M0/N4, RRR, 1/6 systolic murmurs, No gallops or rubs. Respiratory: No rales, wheezing, rhonchi or rubs. GI: Soft, nondistended, nontender, no rebound pain, no organomegaly, BS present. GU: No  hematuria Ext: No pitting leg edema bilaterally. 2+DP/PT pulse bilaterally. Musculoskeletal: No joint deformities, No joint redness or warmth, no limitation of ROM in spin. Skin: No rashes.  Neuro: Alert, oriented X3, cranial nerves II-XII grossly intact, moves all extremities normally.  Psych: Patient is not psychotic, no suicidal or hemocidal ideation.  Labs on Admission: I have personally reviewed following labs and imaging studies  CBC: Recent Labs  Lab 11/28/18 2216  WBC 6.6  NEUTROABS 3.6  HGB 13.1  HCT 40.6  MCV 100.0  PLT 709   Basic Metabolic Panel: Recent Labs  Lab 11/28/18 2216  NA 137  K 4.0  CL 104  CO2 21*  GLUCOSE 144*  BUN 22  CREATININE 1.10  CALCIUM 9.3   GFR: Estimated Creatinine Clearance: 74.8 mL/min (by C-G formula based on SCr of 1.1 mg/dL). Liver Function Tests: Recent Labs  Lab 11/28/18 2216  AST 26  ALT 21  ALKPHOS 46  BILITOT 0.9  PROT 6.5  ALBUMIN 3.8   Recent Labs  Lab 11/28/18 2216  LIPASE 40   No results for input(s): AMMONIA in the last 168 hours. Coagulation Profile: Recent Labs  Lab 11/28/18 2216  INR 1.26   Cardiac Enzymes: Recent Labs  Lab 11/28/18 2216  TROPONINI <0.03   BNP (last 3 results) Recent Labs    10/24/18 1619 11/21/18 1022  PROBNP 288.0* 830*   HbA1C: No results for input(s): HGBA1C in the last 72 hours. CBG: No results for input(s): GLUCAP in the last 168 hours. Lipid Profile: No results for input(s): CHOL, HDL, LDLCALC, TRIG, CHOLHDL, LDLDIRECT in the last 72 hours. Thyroid Function Tests: No results for input(s): TSH, T4TOTAL, FREET4, T3FREE, THYROIDAB in the last 72 hours. Anemia Panel: No results for input(s): VITAMINB12, FOLATE, FERRITIN, TIBC, IRON, RETICCTPCT in the last 72 hours. Urine analysis:    Component Value Date/Time   COLORURINE YELLOW 11/28/2007 [place] 11/28/2007 1713   LABSPEC 1.010 11/28/2007 1713   PHURINE 6.5 11/28/2007 1713   [place] 11/28/2007 1713   HGBUR NEGATIVE 11/28/2007 1713  BILIRUBINUR NEGATIVE 11/28/2007 1713   KETONESUR NEGATIVE 11/28/2007 1713   PROTEINUR NEGATIVE 11/28/2007 1713   UROBILINOGEN 0.2 11/28/2007 1713   NITRITE NEGATIVE 11/28/2007 1713   LEUKOCYTESUR  11/28/2007 1713    NEGATIVE MICROSCOPIC NOT DONE ON URINES WITH NEGATIVE PROTEIN, BLOOD, LEUKOCYTES, NITRITE, OR GLUCOSE <1000 mg/dL.   Sepsis Labs: @LABRCNTIP (procalcitonin:4,lacticidven:4) )No results found for this or any previous visit (from the past 240 hour(s)).   Radiological Exams on Admission: Dg Chest 2 View  Result Date: 11/28/2018 CLINICAL DATA:  Chest pain EXAM: CHEST - 2 VIEW COMPARISON:  10/26/2018, 10/06/2015 FINDINGS: Post sternotomy changes. Cardiomegaly. Scarring at the bases. No pleural effusion. No pneumothorax. IMPRESSION: No active cardiopulmonary disease. Cardiomegaly. Streaky opacity at the lung bases which may reflect scarring or mild fibrosis. Electronically Signed   By: Donavan Foil M.D.   On: 11/28/2018 23:27     EKG: Independently reviewed.  Sinus rhythm, bradycardia, QTC 481, low voltage, nonspecific T wave change.  Assessment/Plan Principal Problem:   Chest pain Active Problems:   Essential hypertension   Paroxysmal atrial fibrillation (HCC)   BPH without urinary obstruction   HLD (hyperlipidemia)   CAD (coronary artery disease)  Chest pain and hx of CAD: s/p of CBAG.  Initial troponin negative.  Repeated troponin is 0.04.  Currently patient is chest pain-free.  Given history of CABG, patient is at high risk to develop non-STEMI.  - will place on Tele bed for obs - cycle CE q6 x3 and repeat EKG in the am  - prn Nitroglycerin, Morphine, and aspirin, zetia, Imdur - Risk factor stratification: will check FLP and A1C, UDS - 2d echo - card consult is requested via Epic  HTN:  -Continue home medications: Lasix, hydralazine, ramipril -IV hydralazine prn  Paroxysmal atrial fibrillation ([place]):  CHA2DS2-VASc Score is 3, needs oral anticoagulation. Patient is on Eliquis at home. INR is  on admission.  Patient has bradycardia. -Continue Eliquis   BPH without urinary obstruction: -flomax  HLD (hyperlipidemia):  -zetia   DVT ppx: on Eliquis Code Status: Full code Family Communication:    Yes, patient's wife  at bed side Disposition Plan:  Anticipate discharge back to previous home environment Consults called:  none Admission status: Obs / tele     Date of Service 11/29/2018    [facility]   If 7PM-7AM, please contact night-coverage [email or web address] Password [email or web address] 11/29/2018, 1:53 AM

## 2018-11-29 NOTE — Progress Notes (Signed)
PROGRESS NOTE    [name] Brown  OEV:[id number] DOB: 08/07/1951 DOA: 11/28/2018 PCP: Hoyt Koch, MD  Outpatient Specialists:   Brief Narrative: [name] Brown is a 68 y.o. male with medical history significant of hypertension, hyperlipidemia, aortic aneurysm (endograft 2009), atrial fibrillation on Eliquis, CAD, CABG, alcohol abuse in remission, BPH, who presents with chest pain.  Patient underwent cardioversion for the atrial fibrillation yesterday.  According to the patient and the patient's wife, the patient had to be shocked twice.  Patient developed pressure-like chest pain around 9 PM last night, with associated chest tightness, shortness of breath diaphoresis.  Was 0.04, and repeat was 0.06.  EKG has not shown any new ischemic changes.  The cardiology team is directing care.  Currently, the patient is chest pain-free.  Patient was seen alongside patient's wife.  Assessment & Plan:   Principal Problem:   Chest pain Active Problems:   Essential hypertension   Paroxysmal atrial fibrillation (HCC)   BPH without urinary obstruction   HLD (hyperlipidemia)   CAD (coronary artery disease)  Chest pain and hx of CAD: s/p of CBAG.  Initial troponin negative.  Repeated troponin is 0.04.  Currently patient is chest pain-free.  Given history of CABG, patient is at high risk to develop non-STEMI.  - will place on Tele bed for obs - cycle CE q6 x3 and repeat EKG in the am  - prn Nitroglycerin, Morphine, and aspirin, zetia, Imdur - Risk factor stratification: will check FLP and A1C, UDS - 2d echo - card consult is requested via Epic 11/29/2018: Currently, patient is chest pain-free.  Awaiting input from cardiology team.  Further management will depend on hospital course.  HTN:  -Continue home medications: Lasix, hydralazine, ramipril -IV hydralazine prn 11/29/2018: Blood pressure is optimized.  Blood pressure ranges from 96-147/54-86 mmHg.  Patient is not on beta-blocker, query due  to significant bradycardia.  Paroxysmal atrial fibrillation ([place]):  CHA2DS2-VASc Score is 3, needs oral anticoagulation. Patient is on Eliquis at home. INR is  on admission.  Patient has bradycardia. 11/29/2018: Continue Eliquis   BPH without urinary obstruction: -flomax  HLD (hyperlipidemia):  -zetia   DVT ppx: on Eliquis Code Status: Full code Family Communication:   Patient's wife. Disposition Plan:  Anticipate discharge back to previous home environment Consults called:   Cardiology  Consultants:   None  Procedures:   None  Antimicrobials:   None   Subjective: No chest pain No shortness of breath  Objective: Vitals:   11/29/18 0051 11/29/18 0222 11/29/18 0400 11/29/18 0848  BP: 108/61 (!) 103/58 (!) 99/57 (!) 147/86  Pulse: 63 (!) 56 60 69  Resp: 15 10 17 16   Temp:    97.6 F (36.4 C)  TempSrc:    Oral  SpO2: 93% 93% 92% 96%  Weight:    89 kg  Height:    5\' 10"  (1.778 m)   No intake or output data in the 24 hours ending 11/29/18 1049 Filed Weights   11/28/18 2212 11/29/18 0848  Weight: 90.7 kg 89 kg    Examination:  General exam: Appears calm and comfortable  Respiratory system: Clear to auscultation. Respiratory effort normal. Cardiovascular system: S1 & S2 heard.  No pedal edema. Gastrointestinal system: Abdomen is nondistended, soft and nontender. No organomegaly or masses felt. Normal bowel sounds heard. Central nervous system: Alert and oriented. No focal neurological deficits. Extremities: No leg edema  Data Reviewed: I have personally reviewed following labs and imaging studies  CBC: Recent  Labs  Lab 11/28/18 2216  WBC 6.6  NEUTROABS 3.6  HGB 13.1  HCT 40.6  MCV 100.0  PLT 709   Basic Metabolic Panel: Recent Labs  Lab 11/28/18 2216  NA 137  K 4.0  CL 104  CO2 21*  GLUCOSE 144*  BUN 22  CREATININE 1.10  CALCIUM 9.3   GFR: Estimated Creatinine Clearance: 74.2 mL/min (by C-G formula based on SCr of 1.1  mg/dL). Liver Function Tests: Recent Labs  Lab 11/28/18 2216  AST 26  ALT 21  ALKPHOS 46  BILITOT 0.9  PROT 6.5  ALBUMIN 3.8   Recent Labs  Lab 11/28/18 2216  LIPASE 40   No results for input(s): AMMONIA in the last 168 hours. Coagulation Profile: Recent Labs  Lab 11/28/18 2216  INR 1.26   Cardiac Enzymes: Recent Labs  Lab 11/28/18 2216 11/29/18 0223 11/29/18 0721  TROPONINI <0.03 0.04* 0.06*   BNP (last 3 results) Recent Labs    10/24/18 1619 11/21/18 1022  PROBNP 288.0* 830*   HbA1C: No results for input(s): HGBA1C in the last 72 hours. CBG: No results for input(s): GLUCAP in the last 168 hours. Lipid Profile: Recent Labs    11/29/18 0223  CHOL 114  HDL 24*  LDLCALC 84  TRIG 32  CHOLHDL 4.8   Thyroid Function Tests: No results for input(s): TSH, T4TOTAL, FREET4, T3FREE, THYROIDAB in the last 72 hours. Anemia Panel: No results for input(s): VITAMINB12, FOLATE, FERRITIN, TIBC, IRON, RETICCTPCT in the last 72 hours. Urine analysis:    Component Value Date/Time   COLORURINE YELLOW 11/28/2007 1713   APPEARANCEUR CLEAR 11/28/2007 1713   LABSPEC 1.010 11/28/2007 1713   PHURINE 6.5 11/28/2007 1713   GLUCOSEU NEGATIVE 11/28/2007 1713   HGBUR NEGATIVE 11/28/2007 1713   BILIRUBINUR NEGATIVE 11/28/2007 1713   KETONESUR NEGATIVE 11/28/2007 1713   PROTEINUR NEGATIVE 11/28/2007 1713   UROBILINOGEN 0.2 11/28/2007 1713   NITRITE NEGATIVE 11/28/2007 1713   LEUKOCYTESUR  11/28/2007 1713    NEGATIVE MICROSCOPIC NOT DONE ON URINES WITH NEGATIVE PROTEIN, BLOOD, LEUKOCYTES, NITRITE, OR GLUCOSE <1000 mg/dL.   Sepsis Labs: @LABRCNTIP (procalcitonin:4,lacticidven:4)  )No results found for this or any previous visit (from the past 240 hour(s)).       Radiology Studies: Dg Chest 2 View  Result Date: 11/28/2018 CLINICAL DATA:  Chest pain EXAM: CHEST - 2 VIEW COMPARISON:  10/26/2018, 10/06/2015 FINDINGS: Post sternotomy changes. Cardiomegaly. Scarring at the  bases. No pleural effusion. No pneumothorax. IMPRESSION: No active cardiopulmonary disease. Cardiomegaly. Streaky opacity at the lung bases which may reflect scarring or mild fibrosis. Electronically Signed   By: Donavan Foil M.D.   On: 11/28/2018 23:27        Scheduled Meds: . apixaban  5 mg Oral BID  . aspirin EC  81 mg Oral Daily  . cholecalciferol  1,000 Units Oral Daily  . divalproex  500 mg Oral Daily  . doxycycline  50 mg Oral Daily  . ezetimibe  10 mg Oral Daily  . furosemide  20 mg Oral BID  . [START ON 11/30/2018] gabapentin  200 mg Oral QHS  . hydrALAZINE  50 mg Oral BID  . isosorbide mononitrate  30 mg Oral Daily  . multivitamin with minerals  1 tablet Oral Daily  . niacin  1,000 mg Oral BID  . omega-3 acid ethyl esters  1 g Oral Daily  . ramipril  10 mg Oral BID  . tamsulosin  0.4 mg Oral Daily  . traZODone  50 mg  Oral QHS  . vitamin C  500 mg Oral Daily   Continuous Infusions:   LOS: 0 days    Time spent: 25 minutes   Dana Allan, MD  [facility] Pager #: [phone] 7PM-7AM contact night coverage as above

## 2018-11-29 NOTE — Progress Notes (Signed)
PHARMACIST - PHYSICIAN ORDER COMMUNICATION  CONCERNING: P&T Medication Policy on Herbal Medications  DESCRIPTION:  This patient's order for:  Glucosamine/chondroitin  has been noted.  This product(s) is classified as an "herbal" or natural product. Due to a lack of definitive safety studies or FDA approval, nonstandard manufacturing practices, plus the potential risk of unknown drug-drug interactions while on inpatient medications, the Pharmacy and Therapeutics Committee does not permit the use of "herbal" or natural products of this type within [facility].   ACTION TAKEN: The pharmacy department is unable to verify this order at this time and your patient has been informed of this safety policy. Please reevaluate patient's clinical condition at discharge and address if the herbal or natural product(s) should be resumed at that time.  Sherlon Handing, PharmD, BCPS Clinical pharmacist  **Pharmacist phone directory can now be found on [email or web address] (PW [email or web address]).  Listed under [place]. 11/29/2018 2:29 AM

## 2018-11-29 NOTE — Consult Note (Addendum)
Cardiology Consultation:   Patient ID: [name] Brown MRN: [id number]; DOB: 06-18-1951  Admit date: 11/28/2018 Date of Consult: 11/29/2018  Primary Care Provider: Hoyt Koch, MD Primary Cardiologist: Jenkins Rouge, MD  Primary Electrophysiologist:  New Pt appt w/ Dr. Curt Bears 12/06/18 Vascular: Dr. Doren Custard    Patient Profile:   [name] Brown is a 68 y.o. male with a hx of CAD s/p CABG, PVD including AAA s/p EVAR, bilateral carotid artery disease, HTN, SSS,  atrial fibrillation on chronic anticoagulation and s/p recent DCCV yesterday, who is being seen today for the evaluation of chest pain, at the request of Dr. Marthenia Rolling, Internal Medicine.   History of Present Illness:   [name] Brown is followed by Dr. Johnsie Cancel. He has known CAD s/p CABG in 2001 w/ LIMA to LAD, RIMA to PDA, L radial to OM1. In 2015, he had a NSTEMI. Repeat cath showed severe 3 V CAD w/ patent LIMA-LAD, patent RIMA-RCA but occluded free radial graft to the OM. Medical management was recommended. Most recently, he had a NST done 11/08/18 for new exertional dyspnea. Stress test was low risk. No ischemia. 2D echo, also done 1/29 showed normal LVEF, 55-60% and no RWMAs. RV systolic function normal. Trival MR and mild AI. LA severely dilated. RA moderately dilated.   He has atrial fibrillation and SSS. Afib first diagnosed in 2018. CHA2DS2 VASc score is 3. He is on Eliquis for a/c. Dr. Johnsie Cancel recently ordered a 48 hr Holter monitor which showed afib w/ variable rates, ranging from severe bradycardia in the 20s to RVR in the 110s. Longest pause was 3.6 sec. Dr. Johnsie Cancel placed EP referral. He is scheduled to see Dr. Curt Bears on 2/26.  He also has h/o AAA, s/p EVAR, followed by Dr. Scot Dock, as well as bilateral carotid artery disease. Last carotid scan 02/2018 showed 40-59% left ICA disease and 1-39% stenosis in the right ICA.   Pt was recently seen by  Dr. Johnsie Cancel in clinic 2/11to discuss recent cardiac test. Pt was in afib and noted  marked change in functional status. Decision was made to proceed with attempt at DCCV to see if symptoms would improve if back in NSR. DCCV was performed yesterday.  Per procedural note, he required 2 shocks and converted to NSR. Pt was sent home after observation but returned back to the hospital ED w/ CC of chest pain.  Pt reports he was feeling fine when he left the hospital post cardioversion. He went home and had dinner around 7:30 PM, which consisted of hot dogs. Around 9PM he developed SSCP, that felt like tightness. Occurred at rest. Felt similar to his angina he had with his NSTEMI in 2015, except he had no left arm pain last night. He walked around his house and the pain intensified. His wife called 78. They recommended ASA and nitro. No relief after the 1st nitro but the 2nd resulted in improvement. EMS placed a nitro patch and pain resolved. He denies any recurrence since last night. Currently CP free. No resting dyspnea. Remains in NSR on tele. Initial troponin was negative, however 2nd and 3rd troponins mildly elevated, <0.03>>0.06. EKG showed sinus bradycardia, HR mid 50s w/ nonspecific anterolateral Twave abnormalties. His last dose of Eliquis was last night around 6:30 PM.    Past Medical History:  Diagnosis Date  . Aortic aneurysm ([place]) Endograf 2009  . Arthritis   . Atrial fibrillation ([place])   . Bradycardia   . CAD (coronary artery disease) CABG 03/2000  Median sternotomy for coronary artery bypass grafting x 3 (left  . HTN (hypertension)   . Hx of CABG 2001   severe 3 vessel disease  . Hx of colonic polyp 06/2010   Hyperplastic   . Hyperlipidemia   . Myocardial infarction [facility]     Past Surgical History:  Procedure Laterality Date  . CARDIOVERSION N/A 11/28/2018   Procedure: CARDIOVERSION;  Surgeon: Elouise Munroe, MD;  Location[facility] ENDOSCOPY;  Service: Cardiovascular;  Laterality: N/A;  . COLONOSCOPY    . CORONARY ARTERY BYPASS GRAFT     AAA stent graft on  12/01/2007 at [facility]  coronary bypass in 2001. Dr. Roxy Manns.  Marland Kitchen FOOT SURGERY Left 07/19/2017  . LEFT HEART CATHETERIZATION WITH CORONARY ANGIOGRAM N/A 10/26/2013   Procedure: LEFT HEART CATHETERIZATION WITH CORONARY ANGIOGRAM;  Surgeon: Peter M Martinique, MD;  Location: [facility] CATH LAB;  Service: Cardiovascular;  Laterality: N/A;     Home Medications:  Prior to Admission medications   Medication Sig Start Date End Date Taking? Authorizing Provider  apixaban (ELIQUIS) 5 MG TABS tablet Take 1 tablet (5 mg total) by mouth 2 (two) times daily. 10/26/18  Yes Josue Hector, MD  aspirin EC 81 MG EC tablet Take 1 tablet (81 mg total) by mouth daily. 10/29/13  Yes Kilroy, Doreene Burke, PA-C  cholecalciferol (VITAMIN D) 25 MCG (1000 UT) tablet Take 1,000 Units by mouth daily.    Yes [provider]  divalproex (DEPAKOTE ER) 500 MG 24 hr tablet Take 1 tablet (500 mg total) by mouth daily. NEEDS ANNUAL VISIT FOR FURTHER REFILLS 10/24/18  Yes Hoyt Koch, MD  Doxycycline Hyclate 50 MG TABS Take 50 mg by mouth daily.    Yes [provider]  ezetimibe (ZETIA) 10 MG tablet Take 1 tablet (10 mg total) by mouth daily. 10/23/18  Yes Josue Hector, MD  furosemide (LASIX) 20 MG tablet Take 1 tablet (20 mg total) by mouth 2 (two) times daily. 11/22/18  Yes Josue Hector, MD  gabapentin (NEURONTIN) 100 MG capsule TAKE 2 CAPSULES(200 MG) BY MOUTH AT BEDTIME Patient taking differently: Take 200 mg by mouth at bedtime. TAKE 2 CAPSULES(200 MG) BY MOUTH AT BEDTIME 03/09/18  Yes Hulan Saas M, DO  Glucosamine-Chondroitin (GLUCOSAMINE CHONDR COMPLEX PO) Take 500 mg by mouth 2 (two) times daily.    Yes [provider]  hydrALAZINE (APRESOLINE) 50 MG tablet TAKE 1 TABLET(50 MG) BY MOUTH TWICE DAILY Patient taking differently: Take 50 mg by mouth 2 (two) times daily.  08/22/18  Yes Josue Hector, MD  isosorbide mononitrate (IMDUR) 30 MG 24 hr tablet TAKE 1 TABLET BY MOUTH EVERY  DAY Patient taking differently: Take 30 mg by mouth daily.  08/08/18  Yes Josue Hector, MD  Multiple Vitamin (MULTIVITAMIN) capsule Take 1 capsule by mouth daily.   Yes [provider]  niacin (NIASPAN) 1000 MG CR tablet Take 1,000 mg by mouth 2 (two) times daily.     Yes [provider]  nitroGLYCERIN (NITROSTAT) 0.4 MG SL tablet DISSOLVE 1 TABLET UNDER THE TONGUE EVERY 5 MINUTES FOR 3 DOSES AS NEEDED FOR CHEST PAIN Patient taking differently: Place 0.4 mg under the tongue every 5 (five) minutes as needed for chest pain. DISSOLVE 1 TABLET UNDER THE TONGUE EVERY 5 MINUTES FOR 3 DOSES AS NEEDED FOR CHEST PAIN 11/01/18  Yes Josue Hector, MD  Omega-3 Fatty Acids (FISH OIL) 1200 MG CAPS Take 1,200 mg by mouth 2 (  two) times daily.   Yes [provider]  potassium chloride SA (K-DUR,KLOR-CON) 20 MEQ tablet TAKE 1 TABLET BY MOUTH EVERY DAY Patient taking differently: Take 20 mEq by mouth daily.  09/28/18  Yes Josue Hector, MD  ramipril (ALTACE) 10 MG capsule TAKE 1 CAPSULE BY MOUTH TWICE DAILY Patient taking differently: Take 10 mg by mouth 2 (two) times daily.  09/28/18  Yes Josue Hector, MD  tamsulosin (FLOMAX) 0.4 MG CAPS capsule Take 1 capsule (0.4 mg total) by mouth daily. Need annual exam with labs for further refills 09/11/18  Yes Hoyt Koch, MD  tiZANidine (ZANAFLEX) 4 MG tablet Take 1 tablet (4 mg total) by mouth every 8 (eight) hours as needed for muscle spasms. 03/09/18  Yes Lyndal Pulley, DO  traZODone (DESYREL) 50 MG tablet TAKE 1/2 TO 1 TABLET(25 TO 50 MG) BY MOUTH AT BEDTIME AS NEEDED FOR SLEEP Patient taking differently: Take 50 mg by mouth at bedtime.  11/02/18  Yes Hoyt Koch, MD  vitamin C (ASCORBIC ACID) 500 MG tablet Take 500 mg by mouth daily.   Yes [provider]    Inpatient Medications: Scheduled Meds: . apixaban  5 mg Oral BID  . aspirin EC  81 mg Oral Daily  . cholecalciferol  1,000 Units Oral Daily  .  divalproex  500 mg Oral Daily  . doxycycline  50 mg Oral Daily  . ezetimibe  10 mg Oral Daily  . furosemide  20 mg Oral BID  . [START ON 11/30/2018] gabapentin  200 mg Oral QHS  . hydrALAZINE  50 mg Oral BID  . isosorbide mononitrate  30 mg Oral Daily  . multivitamin with minerals  1 tablet Oral Daily  . niacin  1,000 mg Oral BID  . omega-3 acid ethyl esters  1 g Oral Daily  . ramipril  10 mg Oral BID  . tamsulosin  0.4 mg Oral Daily  . traZODone  50 mg Oral QHS  . vitamin C  500 mg Oral Daily   Continuous Infusions:  PRN Meds: acetaminophen, hydrALAZINE, morphine injection, nitroGLYCERIN, ondansetron (ZOFRAN) IV, tiZANidine  Allergies:    Allergies  Allergen Reactions  . Statins     Myalgias with elevated CPKs    Social History:   Social History   Socioeconomic History  . Marital status: Married    Spouse name: Not on file  . Number of children: Not on file  . Years of education: Not on file  . Highest education level: Not on file  Occupational History  . Not on file  Social Needs  . Financial resource strain: Not on file  . Food insecurity:    Worry: Not on file    Inability: Not on file  . Transportation needs:    Medical: Not on file    Non-medical: Not on file  Tobacco Use  . Smoking status: Former Smoker    Packs/day: 2.00    Years: 30.00    Pack years: 60.00    Types: Cigarettes    Last attempt to quit: 01/05/1992    Years since quitting: 26.9  . Smokeless tobacco: Never Used  . Tobacco comment: quit 19 yrs ago  Substance and Sexual Activity  . Alcohol use: No  . Drug use: No  . Sexual activity: Not on file  Lifestyle  . Physical activity:    Days per week: Not on file    Minutes per session: Not on file  . Stress: Not on  file  Relationships  . Social connections:    Talks on phone: Not on file    Gets together: Not on file    Attends religious service: Not on file    Active member of club or organization: Not on file    Attends meetings of  clubs or organizations: Not on file    Relationship status: Not on file  . Intimate partner violence:    Fear of current or ex partner: Not on file    Emotionally abused: Not on file    Physically abused: Not on file    Forced sexual activity: Not on file  Other Topics Concern  . 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    Family History:    Family History  Problem Relation Age of Onset  . Heart attack Mother   . Heart disease Mother   . Heart attack Father   . Heart disease Father   . Rectal cancer Neg Hx   . Stomach cancer Neg Hx   . Pancreatic cancer Neg Hx   . Esophageal cancer Neg Hx   . Colon polyps Neg Hx   . Colon cancer Neg Hx      ROS:  Please see the history of present illness.   All other ROS reviewed and negative.     Physical Exam/Data:   Vitals:   11/29/18 0222 11/29/18 0400 11/29/18 0848 11/29/18 1244  BP: (!) 103/58 (!) 99/57 (!) 147/86 (!) 154/93  Pulse: (!) 56 60 69 78  Resp: 10 17 16 18   Temp:   97.6 F (36.4 C) (!) 97.5 F (36.4 C)  TempSrc:   Oral Oral  SpO2: 93% 92% 96% 94%  Weight:   89 kg   Height:   5\' 10"  (1.778 m)    No intake or output data in the 24 hours ending 11/29/18 1332 Last 3 Weights 11/29/2018 11/28/2018 11/28/2018  Weight (lbs) 196 lb 3.4 oz 200 lb 198 lb  Weight (kg) 89 kg 90.719 kg 89.812 kg     Body mass index is 28.15 kg/m.  General:  Well nourished, well developed, in no acute distress HEENT: normal Lymph: no adenopathy Neck: no JVD Endocrine:  No thryomegaly Vascular: No carotid bruits; FA pulses 2+ bilaterally without bruits  Cardiac:  normal S1, S2; RRR; no murmur  Lungs:  clear to auscultation bilaterally, no wheezing, rhonchi or rales  Abd: soft, nontender, no hepatomegaly  Ext: no edema Musculoskeletal:  No deformities, BUE and BLE strength normal and equal Skin: warm and dry  Neuro:  CNs 2-12 intact, no focal abnormalities  noted Psych:  Normal affect   EKG:  The EKG was personally reviewed and demonstrates:  EKG showed sinus bradycardia, HR mid 50s w/ nonspecific anterolateral Twave abnormalties.  Telemetry:  Telemetry was personally reviewed and demonstrates:  NSR currently. 80s. No significant pauses.   Relevant CV Studies:  [facility] 10/26/2013 Coronary angiography: Coronary dominance: right  Left mainstem: Heavily calcified and tortuous. 40% distal stenosis.  Left anterior descending (LAD): Very complex proximal disease up to 95%. Heavy calcification. The LAD is occluded after the first diagonal and first septal perforator. The first diagonal is a moderate sized vessel with 40% disease.  Left circumflex (LCx):  The left circumflex is calcified. There is 30-40% disease in the mid vessel. The first OM appears small and is occluded. The second OM is tiny. The terminal  OM has segmental 50-60% disease.  Right coronary artery (RCA): 100% proximal occlusion.  The free radial graft to the OM is 100% occluded at the ostium.  The RIMA graft to the RCA is a large graft and widely patent.  The LIMA graft to the LAD is a large graft and is widely patent. It gives collaterals to an RV marginal branch.  Left ventriculography: Left ventricular systolic function is normal, LVEF is estimated at 50-55%,  There is hypokinesis of the mid anterior wall, there is no significant mitral regurgitation   Final Conclusions:   1. Severe 3 vessel occlusive CAD 2. Patent LIMA to the LAD 3. Patent RIMA to the RCA 4. Occluded free radial graft to the OM 5. Good LV function.  Recommendations: Medical management.   Collier [facility] 10/26/2013, 12:49 PM  __________________________  NST 11/08/18 Study Highlights     The left ventricular ejection fraction is normal (55-65%).  Nuclear stress EF: 56%. Post CABG septal wall hypokinesis.  There was no ST segment deviation noted during stress.  There is mildly  reduced radiotracer uptake at both rest and stress in the anterior, lateral and inferolateral distributions. No ischemia identified. (POST CABG)  This is a low risk study.   Candee Furbish, MD   ____________________________  2D Echo 11/08/18 IMPRESSIONS    1. The left ventricle appears to be normal in size, have mild wall thickness, with normal systolic function of 72-53%. Echo evidence of indeterminate in diastolic filling patterns.  2. No regional wall motion abnormalities seen.  3. Right ventricular systolic pressure is is normal.  4. The right ventricle is mildly enlarged in size, has normal wall thickness and normal systolic function.  5. Severely dilated left atrial size.  6. Moderately dilated right atrial size.  7. Mitral valve regurgitation is trivial by color flow Doppler.  8. The mitral valve normal in structure and function.  9. Normal tricuspid valve. 10. Tricuspid regurgitation is mild. 11. Aortic valve regurgitation is mild by color flow Doppler. 12. Aortic valve tricuspid. 13. Mild dilatation of the ascending aorta. 14. No atrial level shunt detected by color flow Doppler.  ____________________________  48 HR Holter Monitor 11/08/18 Study Highlights   Afib Rates 22-110 Average HR 54 bpm Longest pause 3.6 seconds Patient being referred to EP For PAF with SSS    ____________________________  DCCV 11/28/18  Patient placed on cardiac monitor, pulse oximetry, supplemental oxygen as necessary.  Sedation given: propofol per anesthesia Pacer pads placed anterior and posterior chest.  Cardioverted 2 time(s).  Cardioversion with synchronized biphasic 120J, 200J shock.  Evaluation: Findings: Post procedure EKG shows: NSR Complications: None Patient did tolerate procedure well.  Laboratory Data:  Chemistry Recent Labs  Lab 11/28/18 2216  NA 137  K 4.0  CL 104  CO2 21*  GLUCOSE 144*  BUN 22  CREATININE 1.10  CALCIUM 9.3  GFRNONAA >60  GFRAA >60   ANIONGAP 12    Recent Labs  Lab 11/28/18 2216  PROT 6.5  ALBUMIN 3.8  AST 26  ALT 21  ALKPHOS 46  BILITOT 0.9   Hematology Recent Labs  Lab 11/28/18 2216  WBC 6.6  RBC 4.06*  HGB 13.1  HCT 40.6  MCV 100.0  MCH 32.3  MCHC 32.3  RDW 14.2  PLT 177   Cardiac Enzymes Recent Labs  Lab 11/28/18 2216 11/29/18 0223 11/29/18 0721  TROPONINI <0.03 0.04* 0.06*   No results for input(s): TROPIPOC in the last 168 hours.  BNP Recent Labs  Lab 11/28/18 2259  BNP 111.3*    DDimer No results for input(s): DDIMER in the last 168 hours.  Radiology/Studies:  Dg Chest 2 View  Result Date: 11/28/2018 CLINICAL DATA:  Chest pain EXAM: CHEST - 2 VIEW COMPARISON:  10/26/2018, 10/06/2015 FINDINGS: Post sternotomy changes. Cardiomegaly. Scarring at the bases. No pleural effusion. No pneumothorax. IMPRESSION: No active cardiopulmonary disease. Cardiomegaly. Streaky opacity at the lung bases which may reflect scarring or mild fibrosis. Electronically Signed   By: Donavan Foil M.D.   On: 11/28/2018 23:27    Assessment and Plan:   PEREGRINE NOLT is a 68 y.o. male with a hx of CAD s/p CABG, PVD including AAA s/p EVAR, bilateral carotid artery disease, HTN, SSS,  atrial fibrillation on chronic anticoagulation and s/p recent DCCV yesterday, who is being seen today for the evaluation of chest pain, at the request of Dr. Marthenia Rolling, Internal Medicine.   1. Chest Pain w/ Mildly Elevated Tropoin: symptoms concerning for coronary ischemia. Similar symptoms as his MI chest pain in 2015. SSC pressure occuraing at rest and intensifying with physical activity and relieved with nitroglycerin. Initial troponin was negative, however 2nd and 3rd troponins mildly elevated, <0.03>>0.06>>0.06. Admit EKG showed sinus bradycardia, HR mid 50s w/ nonspecific anterolateral Twave abnormalties. He is currently CP free. He had a recent NST last month that was negative for ischemia. I briefly discussed case with Dr. Johnsie Cancel  via phone. Given cardioversion in the last 30 days, he wishes not to interrupt anticoagulation if possible. Pt is currently CP free. Will continue Eliquis and continue to observe. Continue to trend troponins. May need further titration of Imdur. Can consider outpatient cath after he has finished 4 weeks of uninterrupted anticoagulation.    2. CAD: s/p CABG in 2001 w/ LIMA to LAD, RIMA to PDA, L radial to OM1. Last cath in 2015 showed patient LIMA-LAD and patent RIMA to RCA but occluded free radial graft to the OM. Medical therapy elected. He just had a exercise myoview 11/08/18 that showed no ischemia. Recent symptoms however are concerning but he is now CP free. No plans for cath this admit due to cardioversion yesterday. May need further titration of Imdur.   3. Atrial Fibrillation: s/p DCCV yesterday. Maintaining NSR currently. Not on any AV nodal blocking agents to due bradycardia and SSS. He is on Eliquis but did not get a dose this morning. Last dose was last night around 6:30 PM. He needs to be anticoagulated to reduce risk of thrombus formation given cardioversion in the last 30 days. Per Dr. Johnsie Cancel, Will give a dose of Eliquis now since he missed AM dose and he will get another dose tonight. He has an appt w/ EP next week. May need AADT vs pacer + rate control if recurrent afib.   4. SSS: recent outpatient 48 hr Holter monitor 10/2018 showed afib w/ variable rates, ranging from severe bradycardia in the 20s to RVR in the 110s. Longest pause was 3.6 sec. Dr. Johnsie Cancel placed EP referral. He is scheduled to see Dr. Curt Bears on 2/26. He is currently in NSR. HR in the 60s. Avoid AV nodal blocking agents. Continue to monitor.   5. AAA: s/p EVAR. Followed by Dr. Scot Dock.   6. Carotid Artery Disease: dopplers followed by Dr. Johnsie Cancel yearly. Most recent dopplers 02/2018 showed 40-59% left ICA disease and 1-39% stenosis in the right ICA. Continue ASA + BP and lipid management.   7. HTN: elevated at 154/93.  Continue Imdur, ramipril and hydralazine.  No  blocker due to bradycardia.   8. HLD: Lipid panel today showed elevated LDL at 84 mg/dL. LDL goal in the setting of CAD is < 70 mg/dL. He is statin intolerant. He is currently on Zetia 10 mg and niacin 100 mg. Consider referal to lipid clinic for PCSK9 inhibitors.   For questions or updates, please contact [place] Please consult [email or web address] for contact info under     Signed, Lyda Jester, PA-C  11/29/2018 1:32 PM   Agree wth assessment as noted above by B Simmons.   Pt seen   Case reviewed     Hx of signifcant CAD    Now with recent caridoversion    CP  After     Pt is currently pain free   Plan for monitoring troponins  COntinue telemetry    Continue anticoagulation  Dorris Carnes MD

## 2018-11-29 NOTE — Progress Notes (Signed)
Patient admitted to room 5c12,  Alert oriented x4. Respirations even unlabored. Denies chest pain at this time. Placed on tele Afib noted and irregular heart rate. Educated patient on falls safety and oriented to room. Updated on plan to await recommendations from cardiology. Wife at bedside. Will continue to monitor.

## 2018-11-30 ENCOUNTER — Telehealth: Payer: Self-pay | Admitting: *Deleted

## 2018-11-30 DIAGNOSIS — I25729 Atherosclerosis of autologous artery coronary artery bypass graft(s) with unspecified angina pectoris: Secondary | ICD-10-CM

## 2018-11-30 DIAGNOSIS — R072 Precordial pain: Secondary | ICD-10-CM

## 2018-11-30 DIAGNOSIS — I480 Paroxysmal atrial fibrillation: Secondary | ICD-10-CM | POA: Diagnosis not present

## 2018-11-30 DIAGNOSIS — E785 Hyperlipidemia, unspecified: Secondary | ICD-10-CM | POA: Diagnosis not present

## 2018-11-30 DIAGNOSIS — R079 Chest pain, unspecified: Secondary | ICD-10-CM

## 2018-11-30 DIAGNOSIS — F1290 Cannabis use, unspecified, uncomplicated: Secondary | ICD-10-CM

## 2018-11-30 DIAGNOSIS — R7989 Other specified abnormal findings of blood chemistry: Secondary | ICD-10-CM | POA: Diagnosis not present

## 2018-11-30 DIAGNOSIS — I10 Essential (primary) hypertension: Secondary | ICD-10-CM | POA: Diagnosis not present

## 2018-11-30 LAB — HEMOGLOBIN A1C
Hgb A1c MFr Bld: 5.8 % — ABNORMAL HIGH (ref 4.8–5.6)
Mean Plasma Glucose: 120 mg/dL

## 2018-11-30 NOTE — Progress Notes (Addendum)
Pt discharge instructions reviewed with pt. Pt verbalizes understanding and states they have no questions. Pt belongings with pt. Pt is not in distress. Pt's wife is driving him home. Pt discharged via wheelchair. Pt refused to let RN complete a shift assessment.

## 2018-11-30 NOTE — Telephone Encounter (Signed)
Pt was on TCM report admitted for observation 11/28/18 for chest pain. Pt states he has an appt w/his cardiologist on 12/06/18. Will follow-up w/them instead of PCP since its dealing with heart issues.Marland KitchenJohny Chess

## 2018-11-30 NOTE — Progress Notes (Signed)
Progress Note  Patient Name: [name] Brown Date of Encounter: 11/30/2018  Primary Cardiologist: Jenkins Rouge, MD   Subjective   Pt denies CP  Breathing is OK  Upset he spent the night  Inpatient Medications    Scheduled Meds: . apixaban  5 mg Oral BID  . aspirin EC  81 mg Oral Daily  . cholecalciferol  1,000 Units Oral Daily  . divalproex  500 mg Oral Daily  . doxycycline  50 mg Oral Daily  . ezetimibe  10 mg Oral Daily  . furosemide  20 mg Oral BID  . gabapentin  200 mg Oral QHS  . hydrALAZINE  50 mg Oral BID  . isosorbide mononitrate  30 mg Oral Daily  . multivitamin with minerals  1 tablet Oral Daily  . niacin  1,000 mg Oral BID  . omega-3 acid ethyl esters  1 g Oral Daily  . ramipril  10 mg Oral BID  . tamsulosin  0.4 mg Oral Daily  . traZODone  50 mg Oral QHS  . vitamin C  500 mg Oral Daily   Continuous Infusions:  PRN Meds: acetaminophen, hydrALAZINE, morphine injection, nitroGLYCERIN, ondansetron (ZOFRAN) IV, tiZANidine   Vital Signs    Vitals:   11/29/18 1244 11/29/18 1505 11/29/18 2334 11/30/18 0609  BP: (!) 154/93 (!) 150/90 112/72 134/77  Pulse: 78 66 85 81  Resp: 18     Temp: (!) 97.5 F (36.4 C) 97.8 F (36.6 C) 97.9 F (36.6 C) 98.4 F (36.9 C)  TempSrc: Oral Oral Oral Oral  SpO2: 94% 94% 93% 93%  Weight:      Height:       No intake or output data in the 24 hours ending 11/30/18 0738 Last 3 Weights 11/29/2018 11/28/2018 11/28/2018  Weight (lbs) 196 lb 3.4 oz 200 lb 198 lb  Weight (kg) 89 kg 90.719 kg 89.812 kg      Telemetry    SR, ST   Some ventricular bigeminy   - Personally Reviewed   Labs    Chemistry Recent Labs  Lab 11/28/18 2216  NA 137  K 4.0  CL 104  CO2 21*  GLUCOSE 144*  BUN 22  CREATININE 1.10  CALCIUM 9.3  PROT 6.5  ALBUMIN 3.8  AST 26  ALT 21  ALKPHOS 46  BILITOT 0.9  GFRNONAA >60  GFRAA >60  ANIONGAP 12     Hematology Recent Labs  Lab 11/28/18 2216  WBC 6.6  RBC 4.06*  HGB 13.1  HCT 40.6    MCV 100.0  MCH 32.3  MCHC 32.3  RDW 14.2  PLT 177    Cardiac Enzymes Recent Labs  Lab 11/28/18 2216 11/29/18 0223 11/29/18 0721 11/29/18 1259  TROPONINI <0.03 0.04* 0.06* 0.06*   No results for input(s): TROPIPOC in the last 168 hours.   BNP Recent Labs  Lab 11/28/18 2259  BNP 111.3*     DDimer No results for input(s): DDIMER in the last 168 hours.   Radiology    Dg Chest 2 View  Result Date: 11/28/2018 CLINICAL DATA:  Chest pain EXAM: CHEST - 2 VIEW COMPARISON:  10/26/2018, 10/06/2015 FINDINGS: Post sternotomy changes. Cardiomegaly. Scarring at the bases. No pleural effusion. No pneumothorax. IMPRESSION: No active cardiopulmonary disease. Cardiomegaly. Streaky opacity at the lung bases which may reflect scarring or mild fibrosis. Electronically Signed   By: Donavan Foil M.D.   On: 11/28/2018 23:27    Cardiac Studies     Patient Profile  68 y.o. male hx of CAD s/p CABG, PVD including AAA s/p EVAR, bilateral carotid artery disease, HTN, SSS,  atrial fibrillation on chronic anticoagulation and s/p recent DCCV yesterday, who is being seen today for the evaluation of chest pain, at the request of Dr. Marthenia Rolling, Internal Medicine.   Assessment & Plan    1  CP   Pt with known CAD   Trivial elevation(0.06) that was flat No rise/fall Pt upset in evening   No CP Plan to continue medical Rx   No evid to support active ischemia   COntinue home meds   OK to D.C  2  Atrial fibrillatoin   Cardioversion last week   Continue anticoagulation    F/U in clnic has already been set   Plan for d/c today    For questions or updates, please contact [place] Please consult [email or web address] for contact info under        Signed, Dorris Carnes, MD  11/30/2018, 7:38 AM

## 2018-11-30 NOTE — Discharge Summary (Signed)
MARICELA KAWAHARA, is a 68 y.o. male  DOB 1950/11/05  MRN [id number].  Admission date:  11/28/2018  Admitting Physician  Ivor Costa, MD  Discharge Date:  11/30/2018   Primary MD  Hoyt Koch, MD  Recommendations for primary care physician for things to follow:      Discharge Diagnosis  Principal Problem:   Chest pain Active Problems:   Essential hypertension   Paroxysmal atrial fibrillation (HCC)   HLD (hyperlipidemia)   CAD (coronary artery disease)   Marijuana use   Elevated troponin      Past Medical History:  Diagnosis Date  . Aortic aneurysm ([place]) Endograf 2009  . Arthritis   . Atrial fibrillation ([place])   . Bradycardia   . CAD (coronary artery disease) CABG 03/2000   Median sternotomy for coronary artery bypass grafting x 3 (left  . HTN (hypertension)   . Hx of CABG 2001   severe 3 vessel disease  . Hx of colonic polyp 06/2010   Hyperplastic   . Hyperlipidemia   . Myocardial infarction [facility])     Past Surgical History:  Procedure Laterality Date  . CARDIOVERSION N/A 11/28/2018   Procedure: CARDIOVERSION;  Surgeon: Elouise Munroe, MD;  Location: [facility] ENDOSCOPY;  Service: Cardiovascular;  Laterality: N/A;  . COLONOSCOPY    . CORONARY ARTERY BYPASS GRAFT     AAA stent graft on 12/01/2007 at [facility]  coronary bypass in 2001. Dr. Roxy Manns.  Marland Kitchen FOOT SURGERY Left 07/19/2017  . LEFT HEART CATHETERIZATION WITH CORONARY ANGIOGRAM N/A 10/26/2013   Procedure: LEFT HEART CATHETERIZATION WITH CORONARY ANGIOGRAM;  Surgeon: Peter M Martinique, MD;  Location: [facility] CATH LAB;  Service: Cardiovascular;  Laterality: N/A;       HPI  from the history and physical done on the day of admission:    GAY RAPE is a 68 y.o. male with medical history significant of hypertension, hyperlipidemia, aortic  aneurysm (endograft 2009), atrial fibrillation on Eliquis, CAD, CABG, alcohol abuse in remission, BPH, who presents with chest pain.  Patient underwent elective atrial fibrillation cardioversion earlier today.  Patient tolerated procedure well.  Patient has been feeling normal after the procedure until 9 PM when he started having chest pain.  The chest pain is located in the central chest, initially 9 out of 10 in severity, tightness pressure-like pain, nonradiating.  It was associated with shortness breath.  No cough, fever or chills.  Patient took aspirin and nitroglycerin, chest pain has resolved currently.  Patient denies nausea, vomiting, diarrhea, abdominal pain, symptoms of UTI or unilateral weakness.  ED Course: pt was found to have negative troponin, INR 1.26, BNP 111.3, electrolytes renal function okay, temperature 97.5, bradycardia, oxygen sat 92 to 93% on room air.  Chest x-ray showed cardiomegaly and basilar scarring.  Patient is placed on telemetry bed for observation.      Hospital Course:   Chest pain, elevated troponin, hx of CAD: s/p of CBAG.    Initial troponin negative, but subseque. Given history  of CABG, patient is at high risk to develop non-STEMI.  Admitted to monitor and check troponins.   Troponin <0.03 ->0.04 ->0.06-> 0.06. Currently patient is chest pain-free.    Ventricular bigeminy noted on telemetry overnight.  Cardiology evaluated and did not recommend any further work-up.  Recommended to continue current home regimen. Recommended to to follow-up with his cardiologist Dr. Jenkins Rouge in the outpatient setting and keep appointment scheduled with Dr. Curt Bears on 2/26 at 9 AM.  Essential hypertension: Blood pressures ranged from 147/86-154/93.patient was continue home medications: Lasix, hydralazine, ramipril -IV hydralazine prn  Paroxysmal atrial fibrillation on chronic anticoagulation: CHA2DS2-VASc Score is 3. Patient just underwent cardioversion last week and  currently on Eliquis. Rate controlled.  Eliquis was continued   BPH without urinary obstruction: -flomax  HLD (hyperlipidemia): Lipid panel on 2/19 revealed total cholesterol 114, HDL 24, LDL 84, and triglycerides 32.  Continued on home regimen of fish oil and Zetia  Prediabetes: Hemoglobin A1c 5.8  Marijuana use: Urine drug screen positive for marijuana.  Patient advised to refrain from illicit drugs as these could sometimes be laced with other substances that may not be tested on regular drug screens.  Follow UP     Consults obtained: Cardiology-Dr. Dorris Carnes  Discharge Condition: Stable  Diet and Activity recommendation: See Discharge Instructions below   Discharge Instructions    Discharge instructions   Complete by:  As directed    Cardiology evaluated you for your chest pain symptoms.  They are recommending that you continue all of your current medications, and keep previously scheduled follow-up appointments with the electrophysiology cardiologist along with your primary cardiologist.        Discharge Medications     Allergies as of 11/30/2018      Reactions   Statins    Myalgias with elevated CPKs      Medication List    TAKE these medications   apixaban 5 MG Tabs tablet Commonly known as:  ELIQUIS Take 1 tablet (5 mg total) by mouth 2 (two) times daily.   aspirin 81 MG EC tablet Take 1 tablet (81 mg total) by mouth daily.   cholecalciferol 25 MCG (1000 UT) tablet Commonly known as:  VITAMIN D Take 1,000 Units by mouth daily.   divalproex 500 MG 24 hr tablet Commonly known as:  DEPAKOTE ER Take 1 tablet (500 mg total) by mouth daily. NEEDS ANNUAL VISIT FOR FURTHER REFILLS   Doxycycline Hyclate 50 MG Tabs Take 50 mg by mouth daily.   ezetimibe 10 MG tablet Commonly known as:  ZETIA Take 1 tablet (10 mg total) by mouth daily.   Fish Oil 1200 MG Caps Take 1,200 mg by mouth 2 (two) times daily.   furosemide 20 MG tablet Commonly known as:   LASIX Take 1 tablet (20 mg total) by mouth 2 (two) times daily.   gabapentin 100 MG capsule Commonly known as:  NEURONTIN TAKE 2 CAPSULES(200 MG) BY MOUTH AT BEDTIME What changed:    how much to take  how to take this  when to take this   GLUCOSAMINE CHONDR COMPLEX PO Take 500 mg by mouth 2 (two) times daily.   hydrALAZINE 50 MG tablet Commonly known as:  APRESOLINE TAKE 1 TABLET(50 MG) BY MOUTH TWICE DAILY What changed:  See the new instructions.   isosorbide mononitrate 30 MG 24 hr tablet Commonly known as:  IMDUR TAKE 1 TABLET BY MOUTH EVERY DAY   multivitamin capsule Take 1 capsule by mouth daily.  niacin 1000 MG CR tablet Commonly known as:  NIASPAN Take 1,000 mg by mouth 2 (two) times daily.   nitroGLYCERIN 0.4 MG SL tablet Commonly known as:  NITROSTAT DISSOLVE 1 TABLET UNDER THE TONGUE EVERY 5 MINUTES FOR 3 DOSES AS NEEDED FOR CHEST PAIN What changed:    how much to take  how to take this  when to take this  reasons to take this   potassium chloride SA 20 MEQ tablet Commonly known as:  K-DUR,KLOR-CON TAKE 1 TABLET BY MOUTH EVERY DAY   ramipril 10 MG capsule Commonly known as:  ALTACE TAKE 1 CAPSULE BY MOUTH TWICE DAILY   tamsulosin 0.4 MG Caps capsule Commonly known as:  FLOMAX Take 1 capsule (0.4 mg total) by mouth daily. Need annual exam with labs for further refills   tiZANidine 4 MG tablet Commonly known as:  ZANAFLEX Take 1 tablet (4 mg total) by mouth every 8 (eight) hours as needed for muscle spasms.   traZODone 50 MG tablet Commonly known as:  DESYREL TAKE 1/2 TO 1 TABLET(25 TO 50 MG) BY MOUTH AT BEDTIME AS NEEDED FOR SLEEP What changed:  See the new instructions.   vitamin C 500 MG tablet Commonly known as:  ASCORBIC ACID Take 500 mg by mouth daily.       Major procedures and Radiology Reports - PLEASE review detailed and final reports for all details, in brief -      Dg Chest 2 View  Result Date: 11/28/2018 CLINICAL  DATA:  Chest pain EXAM: CHEST - 2 VIEW COMPARISON:  10/26/2018, 10/06/2015 FINDINGS: Post sternotomy changes. Cardiomegaly. Scarring at the bases. No pleural effusion. No pneumothorax. IMPRESSION: No active cardiopulmonary disease. Cardiomegaly. Streaky opacity at the lung bases which may reflect scarring or mild fibrosis. Electronically Signed   By: Donavan Foil M.D.   On: 11/28/2018 23:27    Micro Results     No results found for this or any previous visit (from the past 240 hour(s)).     Today   Subjective    [name] today is very upset and requesting to leave AMA because he spent the night over in the hospital.   Objective   Blood pressure 134/77, pulse 81, temperature 98.4 F (36.9 C), temperature source Oral, resp. rate 18, height 5\' 10"  (1.778 m), weight 89 kg, SpO2 93 %.  No intake or output data in the 24 hours ending 11/30/18 0753  Exam  Constitutional: Older male and NAD, but appears annoyed Eyes: PERRL, lids and conjunctivae normal ENMT: Mucous membranes are moist. Posterior pharynx clear of any exudate or lesions.  Neck: normal, supple, no masses, no thyromegaly Respiratory: clear to auscultation bilaterally, no wheezing, no crackles. Normal respiratory effort. No accessory muscle use.  Cardiovascular: Regular rate and rhythm, no murmurs / rubs / gallops. No extremity edema. 2+ pedal pulses. No carotid bruits.  Abdomen: no tenderness, no masses palpated. No hepatosplenomegaly. Bowel sounds positive.  Musculoskeletal: no clubbing / cyanosis. No joint deformity upper and lower extremities. Good ROM, no contractures. Normal muscle tone.  Skin: no rashes, lesions, ulcers. No induration Neurologic: CN 2-12 grossly intact. Sensation intact, DTR normal. Strength 5/5 in all 4.  Psychiatric: Normal judgment and insight. Alert and oriented x 3.  Agitated mood.    Data Review   CBC w Diff:  Lab Results  Component Value Date   WBC 6.6 11/28/2018   HGB 13.1  11/28/2018   HGB 14.3 11/21/2018   HCT 40.6 11/28/2018  HCT 41.1 11/21/2018   PLT 177 11/28/2018   PLT 197 11/21/2018   LYMPHOPCT 31 11/28/2018   MONOPCT 10 11/28/2018   EOSPCT 5 11/28/2018   BASOPCT 1 11/28/2018    CMP:  Lab Results  Component Value Date   NA 137 11/28/2018   NA 140 11/21/2018   K 4.0 11/28/2018   CL 104 11/28/2018   CO2 21 (L) 11/28/2018   BUN 22 11/28/2018   BUN 18 11/21/2018   CREATININE 1.10 11/28/2018   PROT 6.5 11/28/2018   ALBUMIN 3.8 11/28/2018   BILITOT 0.9 11/28/2018   ALKPHOS 46 11/28/2018   AST 26 11/28/2018   ALT 21 11/28/2018  .   Total Time in preparing paper work, data evaluation and todays exam - 35 minutes  Norval Morton M.D on 11/30/2018 at 7:53 AM  [facility]   Office  [phone]

## 2018-12-02 DIAGNOSIS — F1290 Cannabis use, unspecified, uncomplicated: Secondary | ICD-10-CM | POA: Diagnosis present

## 2018-12-02 DIAGNOSIS — R778 Other specified abnormalities of plasma proteins: Secondary | ICD-10-CM | POA: Diagnosis present

## 2018-12-02 DIAGNOSIS — R7989 Other specified abnormal findings of blood chemistry: Secondary | ICD-10-CM

## 2018-12-06 ENCOUNTER — Ambulatory Visit: Payer: PPO | Admitting: Cardiology

## 2018-12-06 ENCOUNTER — Encounter: Payer: Self-pay | Admitting: Cardiology

## 2018-12-06 VITALS — BP 124/70 | HR 77 | Ht 70.0 in | Wt 198.0 lb

## 2018-12-06 DIAGNOSIS — I4819 Other persistent atrial fibrillation: Secondary | ICD-10-CM

## 2018-12-06 MED ORDER — AMIODARONE HCL 200 MG PO TABS: ORAL_TABLET | ORAL | 6 refills | Status: DC

## 2018-12-06 NOTE — Patient Instructions (Signed)
Medication Instructions:  Your physician has recommended you make the following change in your medication:  1. START Amiodarone  - take 1 tablet (200 mg total) TWICE a day for 30 days, then  - take 1 tablet (200 mg total) ONCE daily  * If you need a refill on your cardiac medications before your next appointment, please call your pharmacy.   Labwork: None ordered  Testing/Procedures: None ordered  Follow-Up: Your physician recommends that you schedule a follow-up appointment in: 3 months with the AFib clinic.  Your physician recommends that you schedule a follow-up appointment in: 6 months with Dr. Curt Bears.   Thank you for choosing [facility]!!   Trinidad Curet, RN [phone]  Any Other Special Instructions Will Be Listed Below (If Applicable).  Amiodarone tablets What is this medicine? AMIODARONE (a MEE oh da rone) is an antiarrhythmic drug. It helps make your heart beat regularly. Because of the side effects caused by this medicine, it is only used when other medicines have not worked. It is usually used for heartbeat problems that may be life threatening. This medicine may be used for other purposes; ask your health care provider or pharmacist if you have questions. COMMON BRAND NAME(S): Cordarone, Pacerone What should I tell my health care provider before I take this medicine? They need to know if you have any of these conditions: -liver disease -lung disease -other heart problems -thyroid disease -an unusual or allergic reaction to amiodarone, iodine, other medicines, foods, dyes, or preservatives -pregnant or trying to get pregnant -breast-feeding How should I use this medicine? Take this medicine by mouth with a glass of water. Follow the directions on the prescription label. You can take this medicine with or without food. However, you should always take it the same way each time. Take your doses at regular intervals. Do not take your medicine more often than  directed. Do not stop taking except on the advice of your doctor or health care professional. A special MedGuide will be given to you by the pharmacist with each prescription and refill. Be sure to read this information carefully each time. Talk to your pediatrician regarding the use of this medicine in children. Special care may be needed. Overdosage: If you think you have taken too much of this medicine contact a poison control center or emergency room at once. NOTE: This medicine is only for you. Do not share this medicine with others. What if I miss a dose? If you miss a dose, take it as soon as you can. If it is almost time for your next dose, take only that dose. Do not take double or extra doses. What may interact with this medicine? Do not take this medicine with any of the following medications: -abarelix -apomorphine -arsenic trioxide -certain antibiotics like erythromycin, gemifloxacin, levofloxacin, pentamidine -certain medicines for depression like amoxapine, tricyclic antidepressants -certain medicines for fungal infections like fluconazole, itraconazole, ketoconazole, posaconazole, voriconazole -certain medicines for irregular heart beat like disopyramide, dofetilide, dronedarone, ibutilide, propafenone, sotalol -certain medicines for malaria like chloroquine, halofantrine -cisapride -droperidol -haloperidol -hawthorn -maprotiline -methadone -phenothiazines like chlorpromazine, mesoridazine, thioridazine -pimozide -ranolazine -red yeast rice -vardenafil -ziprasidone This medicine may also interact with the following medications: -antiviral medicines for HIV or AIDS -certain medicines for blood pressure, heart disease, irregular heart beat -certain medicines for cholesterol like atorvastatin, cerivastatin, lovastatin, simvastatin -certain medicines for hepatitis C like sofosbuvir and ledipasvir; sofosbuvir -certain medicines for seizures like phenytoin -certain  medicines for thyroid problems -certain medicines that treat  or prevent blood clots like warfarin -cholestyramine -cimetidine -clopidogrel -cyclosporine -dextromethorphan -diuretics -fentanyl -general anesthetics -grapefruit juice -lidocaine -loratadine -methotrexate -other medicines that prolong the QT interval (cause an abnormal heart rhythm) -procainamide -quinidine -rifabutin, rifampin, or rifapentine -St. John's Wort -trazodone This list may not describe all possible interactions. Give your health care provider a list of all the medicines, herbs, non-prescription drugs, or dietary supplements you use. Also tell them if you smoke, drink alcohol, or use illegal drugs. Some items may interact with your medicine. What should I watch for while using this medicine? Your condition will be monitored closely when you first begin therapy. Often, this drug is first started in a hospital or other monitored health care setting. Once you are on maintenance therapy, visit your doctor or health care professional for regular checks on your progress. Because your condition and use of this medicine carry some risk, it is a good idea to carry an identification card, necklace or bracelet with details of your condition, medications, and doctor or health care professional. Dennis Bast may get drowsy or dizzy. Do not drive, use machinery, or do anything that needs mental alertness until you know how this medicine affects you. Do not stand or sit up quickly, especially if you are an older patient. This reduces the risk of dizzy or fainting spells. This medicine can make you more sensitive to the sun. Keep out of the sun. If you cannot avoid being in the sun, wear protective clothing and use sunscreen. Do not use sun lamps or tanning beds/booths. You should have regular eye exams before and during treatment. Call your doctor if you have blurred vision, see halos, or your eyes become sensitive to light. Your eyes may get  dry. It may be helpful to use a lubricating eye solution or artificial tears solution. If you are going to have surgery or a procedure that requires contrast dyes, tell your doctor or health care professional that you are taking this medicine. What side effects may I notice from receiving this medicine? Side effects that you should report to your doctor or health care professional as soon as possible: -allergic reactions like skin rash, itching or hives, swelling of the face, lips, or tongue -blue-gray coloring of the skin -blurred vision, seeing blue green halos, increased sensitivity of the eyes to light -breathing problems -chest pain -dark urine -fast, irregular heartbeat -feeling faint or light-headed -intolerance to heat or cold -nausea or vomiting -pain and swelling of the scrotum -pain, tingling, numbness in feet, hands -redness, blistering, peeling or loosening of the skin, including inside the mouth -spitting up blood -stomach pain -sweating -unusual or uncontrolled movements of body -unusually weak or tired -weight gain or loss -yellowing of the eyes or skin Side effects that usually do not require medical attention (report to your doctor or health care professional if they continue or are bothersome): -change in sex drive or performance -constipation -dizziness -headache -loss of appetite -trouble sleeping This list may not describe all possible side effects. Call your doctor for medical advice about side effects. You may report side effects to FDA at 1-800-FDA-1088. Where should I keep my medicine? Keep out of the reach of children. Store at room temperature between 20 and 25 degrees C (68 and 77 degrees F). Protect from light. Keep container tightly closed. Throw away any unused medicine after the expiration date. NOTE: This sheet is a summary. It may not cover all possible information. If you have questions about this medicine, talk to your  doctor, pharmacist, or  health care provider.  2019 Elsevier/Gold Standard (2013-12-31 19:48:11)

## 2018-12-06 NOTE — Addendum Note (Signed)
Addended by: Stanton Kidney on: 12/06/2018 09:31 AM   Modules accepted: Orders

## 2018-12-06 NOTE — Progress Notes (Signed)
Electrophysiology Office Note   Date:  12/06/2018   ID:  [name] Brown, DOB 1951-04-30, MRN [id number]  PCP:  Hoyt Koch, MD  Cardiologist:  Johnsie Cancel Primary Electrophysiologist:  Shanesha Bednarz Meredith Leeds, MD    No chief complaint on file.    History of Present Illness: [name] Brown is a 68 y.o. male who is being seen today for the evaluation of atrial fibrillation at the request of Josue Hector, MD. Presenting today for electrophysiology evaluation.  He has a history of coronary artery disease status post three-vessel CABG, hypertension, hyperlipidemia, and atrial fibrillation.  He was found to be in atrial fibrillation incidentally on office visit 08/30/2017.  He was put on Eliquis at the time.  Beta-blockers were not started due to resting bradycardia.  January 2020 he was noted increasing dyspnea on exertion.  He had a cardioversion 11/28/2016.  Since his cardioversion, he is felt improved with less shortness of breath.  He did have a brief hospitalization due to chest pain where medical management was determined to the best option.  Today, he denies symptoms of palpitations, chest pain, shortness of breath, orthopnea, PND, lower extremity edema, claudication, dizziness, presyncope, syncope, bleeding, or neurologic sequela. The patient is tolerating medications without difficulties.    Past Medical History:  Diagnosis Date  . Aortic aneurysm ([place]) Endograf 2009  . Arthritis   . Atrial fibrillation ([place])   . Bradycardia   . CAD (coronary artery disease) CABG 03/2000   Median sternotomy for coronary artery bypass grafting x 3 (left  . HTN (hypertension)   . Hx of CABG 2001   severe 3 vessel disease  . Hx of colonic polyp 06/2010   Hyperplastic   . Hyperlipidemia   . Myocardial infarction [facility])    Past Surgical History:  Procedure Laterality Date  . CARDIOVERSION N/A 11/28/2018   Procedure: CARDIOVERSION;  Surgeon: Elouise Munroe, MD;  Location: [facility] ENDOSCOPY;   Service: Cardiovascular;  Laterality: N/A;  . COLONOSCOPY    . CORONARY ARTERY BYPASS GRAFT     AAA stent graft on 12/01/2007 at [facility]  coronary bypass in 2001. Dr. Roxy Manns.  Marland Kitchen FOOT SURGERY Left 07/19/2017  . LEFT HEART CATHETERIZATION WITH CORONARY ANGIOGRAM N/A 10/26/2013   Procedure: LEFT HEART CATHETERIZATION WITH CORONARY ANGIOGRAM;  Surgeon: Peter M Martinique, MD;  Location: [facility] CATH LAB;  Service: Cardiovascular;  Laterality: N/A;     Current Outpatient Medications  Medication Sig Dispense Refill  . apixaban (ELIQUIS) 5 MG TABS tablet Take 1 tablet (5 mg total) by mouth 2 (two) times daily. 180 tablet 3  . aspirin EC 81 MG EC tablet Take 1 tablet (81 mg total) by mouth daily.    . cholecalciferol (VITAMIN D) 25 MCG (1000 UT) tablet Take 1,000 Units by mouth daily.     . divalproex (DEPAKOTE ER) 500 MG 24 hr tablet Take 1 tablet (500 mg total) by mouth daily. NEEDS ANNUAL VISIT FOR FURTHER REFILLS 90 tablet 3  . Doxycycline Hyclate 50 MG TABS Take 50 mg by mouth daily.     Marland Kitchen ezetimibe (ZETIA) 10 MG tablet Take 1 tablet (10 mg total) by mouth daily. 90 tablet 3  . furosemide (LASIX) 20 MG tablet Take 1 tablet (20 mg total) by mouth 2 (two) times daily. 180 tablet 3  . gabapentin (NEURONTIN) 100 MG capsule TAKE 2 CAPSULES(200 MG) BY MOUTH AT BEDTIME (Patient taking differently: Take 200 mg by mouth at bedtime. TAKE 2 CAPSULES(200  MG) BY MOUTH AT BEDTIME) 180 capsule 3  . Glucosamine-Chondroitin (GLUCOSAMINE CHONDR COMPLEX PO) Take 500 mg by mouth 2 (two) times daily.     . hydrALAZINE (APRESOLINE) 50 MG tablet TAKE 1 TABLET(50 MG) BY MOUTH TWICE DAILY (Patient taking differently: Take 50 mg by mouth 2 (two) times daily. ) 180 tablet 1  . isosorbide mononitrate (IMDUR) 30 MG 24 hr tablet TAKE 1 TABLET BY MOUTH EVERY DAY (Patient taking differently: Take 30 mg by mouth daily. ) 90 tablet 2  . Multiple Vitamin (MULTIVITAMIN) capsule Take 1 capsule by mouth daily.    . niacin  (NIASPAN) 1000 MG CR tablet Take 1,000 mg by mouth 2 (two) times daily.      . nitroGLYCERIN (NITROSTAT) 0.4 MG SL tablet DISSOLVE 1 TABLET UNDER THE TONGUE EVERY 5 MINUTES FOR 3 DOSES AS NEEDED FOR CHEST PAIN (Patient taking differently: Place 0.4 mg under the tongue every 5 (five) minutes as needed for chest pain. DISSOLVE 1 TABLET UNDER THE TONGUE EVERY 5 MINUTES FOR 3 DOSES AS NEEDED FOR CHEST PAIN) 25 tablet 6  . Omega-3 Fatty Acids (FISH OIL) 1200 MG CAPS Take 1,200 mg by mouth 2 (two) times daily.    . potassium chloride SA (K-DUR,KLOR-CON) 20 MEQ tablet TAKE 1 TABLET BY MOUTH EVERY DAY (Patient taking differently: Take 20 mEq by mouth daily. ) 90 tablet 3  . ramipril (ALTACE) 10 MG capsule TAKE 1 CAPSULE BY MOUTH TWICE DAILY (Patient taking differently: Take 10 mg by mouth 2 (two) times daily. ) 180 capsule 3  . tamsulosin (FLOMAX) 0.4 MG CAPS capsule Take 1 capsule (0.4 mg total) by mouth daily. Need annual exam with labs for further refills 90 capsule 0  . tiZANidine (ZANAFLEX) 4 MG tablet Take 1 tablet (4 mg total) by mouth every 8 (eight) hours as needed for muscle spasms. 270 tablet 3  . traZODone (DESYREL) 50 MG tablet TAKE 1/2 TO 1 TABLET(25 TO 50 MG) BY MOUTH AT BEDTIME AS NEEDED FOR SLEEP (Patient taking differently: Take 50 mg by mouth at bedtime. ) 90 tablet 1  . vitamin C (ASCORBIC ACID) 500 MG tablet Take 500 mg by mouth daily.     No current facility-administered medications for this visit.     Allergies:   Statins   Social History:  The patient  reports that he quit smoking about 26 years ago. His smoking use included cigarettes. He has a 60.00 pack-year smoking history. He has never used smokeless tobacco. He reports that he does not drink alcohol or use drugs.   Family History:  The patient's family history includes Heart attack in his father and mother; Heart disease in his father and mother.    ROS:  Please see the history of present illness.   Otherwise, review of  systems is positive for chest pain, palpitations, back pain, easy bruising.   All other systems are reviewed and negative.    PHYSICAL EXAM: VS:  BP 124/70   Pulse 77   Ht 5\' 10"  (1.778 m)   Wt 198 lb (89.8 kg)   BMI 28.41 kg/m  , BMI Body mass index is 28.41 kg/m. GEN: Well nourished, well developed, in no acute distress  HEENT: normal  Neck: no JVD, carotid bruits, or masses Cardiac: RRR; no murmurs, rubs, or gallops,no edema  Respiratory:  clear to auscultation bilaterally, normal work of breathing GI: soft, nontender, nondistended, + BS MS: no deformity or atrophy  Skin: warm and dry Neuro:  Strength  and sensation are intact Psych: euthymic mood, full affect  EKG:  EKG is ordered today. Personal review of the ekg ordered shows sinus rhythm, first-degree AV block, nonspecific T wave changes  Recent Labs: 10/24/2018: TSH 1.11 11/21/2018: NT-Pro BNP 830 11/28/2018: ALT 21; B Natriuretic Peptide 111.3; BUN 22; Creatinine, Ser 1.10; Hemoglobin 13.1; Platelets 177; Potassium 4.0; Sodium 137    Lipid Panel     Component Value Date/Time   CHOL 114 11/29/2018 0223   TRIG 32 11/29/2018 0223   HDL 24 (L) 11/29/2018 0223   CHOLHDL 4.8 11/29/2018 0223   VLDL 6 11/29/2018 0223   LDLCALC 84 11/29/2018 0223     Wt Readings from Last 3 Encounters:  12/06/18 198 lb (89.8 kg)  11/29/18 196 lb 3.4 oz (89 kg)  11/28/18 198 lb (89.8 kg)      Other studies Reviewed: Additional studies/ records that were reviewed today include: TTE 11/08/18  Review of the above records today demonstrates:   1. The left ventricle appears to be normal in size, have mild wall thickness, with normal systolic function of 44-31%. Echo evidence of indeterminate in diastolic filling patterns.  2. No regional wall motion abnormalities seen.  3. Right ventricular systolic pressure is is normal.  4. The right ventricle is mildly enlarged in size, has normal wall thickness and normal systolic function.  5.  Severely dilated left atrial size.  6. Moderately dilated right atrial size.  7. Mitral valve regurgitation is trivial by color flow Doppler.  8. The mitral valve normal in structure and function.  9. Normal tricuspid valve. 10. Tricuspid regurgitation is mild. 11. Aortic valve regurgitation is mild by color flow Doppler. 12. Aortic valve tricuspid. 13. Mild dilatation of the ascending aorta. 14. No atrial level shunt detected by color flow Doppler.  Holter 11/14/18 - personally reviewed Afib Rates 22-110 Average HR 54 bpm Longest pause 3.6 seconds Patient being referred to EP For PAF with SSS  SPECT 11/08/18  The left ventricular ejection fraction is normal (55-65%).  Nuclear stress EF: 56%. Post CABG septal wall hypokinesis.  There was no ST segment deviation noted during stress.  There is mildly reduced radiotracer uptake at both rest and stress in the anterior, lateral and inferolateral distributions. No ischemia identified. (POST CABG)  This is a low risk study.  ASSESSMENT AND PLAN:  1.  Persistent atrial fibrillation: Currently on Eliquis.  He did have a recent cardioversion.  He wore a Holter monitor that showed an average heart rate in the 50s with a pause of up to 3.6 seconds during atrial fibrillation.  Unfortunately his left atrium is severely enlarged.  Due to that, I do think that medical management for rhythm control would be best.  We discussed multiple antiarrhythmics.  He would prefer to be started on amiodarone.  We [name] Brown start him on 200 mg twice a day for a month followed by 200 mg a day.  He does have bradycardia and a long first-degree AV block.  Hopefully this [name] Brown not affect his conduction.  This patients CHA2DS2-VASc Score and unadjusted Ischemic Stroke Rate (% per year) is equal to 3.2 % stroke rate/year from a score of 3  Above score calculated as 1 point each if present [CHF, HTN, DM, Vascular=MI/PAD/Aortic Plaque, Age if 65-74, or Male] Above score  calculated as 2 points each if present [Age > 75, or Stroke/TIA/TE]  2.  Coronary artery disease: Status post CABG with an occluded free radial to the OM.  He  has a normal ejection fraction.  Recent hospitalization for chest pain.  Chest pain-free today.  No changes.  3.  Hypertension: Well-controlled.  4.  Hyperlipidemia: Continue statin per primary cardiology.   Current medicines are reviewed at length with the patient today.   The patient does not have concerns regarding his medicines.  The following changes were made today: Start amiodarone  Labs/ tests ordered today include:  Orders Placed This Encounter  Procedures  . EKG 12-Lead   Case discussed with primary cardiology  Disposition:   FU with Khasir Woodrome 6 months  Signed, Jennell Janosik Meredith Leeds, MD  12/06/2018 9:26 AM     [facility] [address] [place] [address] [phone] (office) [phone] (fax)

## 2018-12-07 ENCOUNTER — Telehealth: Payer: Self-pay | Admitting: *Deleted

## 2018-12-07 NOTE — Telephone Encounter (Signed)
SPOKE WITH PT AND PT SCHEDULED APPOINTMENT 12-29-18 WITH WEAVER AT 9:45 AM

## 2018-12-07 NOTE — Telephone Encounter (Signed)
   [facility] Pre-operative Risk Assessment    Request for surgical clearance:  1. What type of surgery is being performed? EXCISION OF SCALP CYST   2. When is this surgery scheduled? TBD   3. What type of clearance is required (medical clearance vs. Pharmacy clearance to hold med vs. Both)? BOTH  4. Are there any medications that need to be held prior to surgery and how long?EILQUIS HOLD 2 DAYS PRIOR   5. Practice name and name of physician performing surgery? [facility]; DR. PAUL TOTH   6. What is your office phone number [phone]    7.   What is your office fax number [phone]  8.   Anesthesia type (None, local, MAC, general) ? GENERAL    Julaine Hua 12/07/2018, 10:15 AM  _________________________________________________________________   (provider comments below)

## 2018-12-07 NOTE — Telephone Encounter (Signed)
SPOKE WITH WIFE WHO STATED PT IS UNAVIALABLE AT THIS TIME WILL GIVE HIM NUMBER [phone] TO CALL BACK TO MAKE APPOINTMENT

## 2018-12-07 NOTE — Telephone Encounter (Signed)
   Primary Cardiologist: Jenkins Rouge, MD  Electrophysiologist: Dr. Curt Bears  Chart reviewed as part of pre-operative protocol coverage. Pt cannot be cleared to have procedure at this time, as he cannot hold Xarelto at this time due to recent electrical cardioversion attempt. He was seen by Dr. Curt Bears yesterday and back in afib. He started amiodarone. Antiarrhythmic was started in the hopes to chemically cardiovert back to normal sinus rhythm, thus anticoagulation cannot be interrupted at this time as doing so would increase his risk of having a stroke. Recommendations are for him to f/u in the Afib clinic in 3 months.   Preop Callback pool, let requesting MD and patient both know that procedure should be delayed until he has f/u in afib clinic.   I will route this recommendation to the requesting party via Epic fax function and remove from pre-op pool.  Please call with questions.  Lyda Jester, PA-C 12/07/2018, 10:47 AM

## 2018-12-13 ENCOUNTER — Other Ambulatory Visit: Payer: Self-pay | Admitting: Internal Medicine

## 2018-12-21 ENCOUNTER — Other Ambulatory Visit: Payer: PPO

## 2018-12-21 ENCOUNTER — Other Ambulatory Visit: Payer: Self-pay

## 2018-12-21 DIAGNOSIS — R7989 Other specified abnormal findings of blood chemistry: Secondary | ICD-10-CM

## 2018-12-21 LAB — BASIC METABOLIC PANEL
BUN/Creatinine Ratio: 16 (ref 10–24)
BUN: 21 mg/dL (ref 8–27)
CO2: 22 mmol/L (ref 20–29)
Calcium: 9.5 mg/dL (ref 8.6–10.2)
Chloride: 100 mmol/L (ref 96–106)
Creatinine, Ser: 1.28 mg/dL — ABNORMAL HIGH (ref 0.76–1.27)
GFR calc non Af Amer: 58 mL/min/{1.73_m2} — ABNORMAL LOW (ref >59)
GFR, EST AFRICAN AMERICAN: 67 mL/min/{1.73_m2} (ref >59)
Glucose: 73 mg/dL (ref 65–99)
Potassium: 4.9 mmol/L (ref 3.5–5.2)
Sodium: 139 mmol/L (ref 134–144)

## 2018-12-21 LAB — PRO B NATRIURETIC PEPTIDE: NT-Pro BNP: 466 pg/mL — ABNORMAL HIGH (ref 0–376)

## 2018-12-29 ENCOUNTER — Ambulatory Visit: Payer: PPO | Admitting: Physician Assistant

## 2019-01-25 ENCOUNTER — Other Ambulatory Visit: Payer: Self-pay | Admitting: Cardiovascular Disease

## 2019-02-23 ENCOUNTER — Encounter: Payer: Self-pay | Admitting: Internal Medicine

## 2019-02-23 ENCOUNTER — Ambulatory Visit (INDEPENDENT_AMBULATORY_CARE_PROVIDER_SITE_OTHER): Payer: PPO | Admitting: Internal Medicine

## 2019-02-23 ENCOUNTER — Encounter (HOSPITAL_COMMUNITY): Payer: Self-pay

## 2019-02-23 ENCOUNTER — Ambulatory Visit: Payer: Self-pay | Admitting: Internal Medicine

## 2019-02-23 DIAGNOSIS — R6889 Other general symptoms and signs: Secondary | ICD-10-CM | POA: Diagnosis not present

## 2019-02-23 MED ORDER — FLUTICASONE PROPIONATE 50 MCG/ACT NA SUSP: 2.0000 | Freq: Every day | NASAL | 6 refills | Status: DC

## 2019-02-23 NOTE — Telephone Encounter (Signed)
Virtual scheduled  °

## 2019-02-23 NOTE — Telephone Encounter (Signed)
  Pt. Reports chest "heaviness" woke him up during the night.Reports "I have had heart problems, but this feels different.It's at the bottom of my throat and I have to take deep breaths.I feel congestion in my throat." Denies cough, radiation of pain. No dizziness, nausea or sweating. Got up and ate breakfast. States "I don't want to go to the ED if I don't have to." Spoke with Sam in the practice and will forward triage for review. Answer Assessment - Initial Assessment Questions 1. LOCATION: "Where does it hurt?"       Hurts/Heaviness at throat 2. RADIATION: "Does the pain go anywhere else?" (e.g., into neck, jaw, arms, back)     No 3. ONSET: "When did the chest pain begin?" (Minutes, hours or days)      Last night 4. PATTERN "Does the pain come and go, or has it been constant since it started?"  "Does it get worse with exertion?"      Constant 5. DURATION: "How long does it last" (e.g., seconds, minutes, hours)     Constant 6. SEVERITY: "How bad is the pain?"  (e.g., Scale 1-10; mild, moderate, or severe)    - MILD (1-3): doesn't interfere with normal activities     - MODERATE (4-7): interferes with normal activities or awakens from sleep    - SEVERE (8-10): excruciating pain, unable to do any normal activities       5-7 7. CARDIAC RISK FACTORS: "Do you have any history of heart problems or risk factors for heart disease?" (e.g., prior heart attack, angina; high blood pressure, diabetes, being overweight, high cholesterol, smoking, or strong family history of heart disease)     Yes 8. PULMONARY RISK FACTORS: "Do you have any history of lung disease?"  (e.g., blood clots in lung, asthma, emphysema, birth control pills)     No 9. CAUSE: "What do you think is causing the chest pain?"     Unsure 10. OTHER SYMPTOMS: "Do you have any other symptoms?" (e.g., dizziness, nausea, vomiting, sweating, fever, difficulty breathing, cough)       Shortness of breath 11. PREGNANCY: "Is there any chance  you are pregnant?" "When was your last menstrual period?"       n/a  Protocols used: CHEST PAIN-A-AH

## 2019-02-23 NOTE — Telephone Encounter (Signed)
covid risk is a 4

## 2019-02-23 NOTE — Assessment & Plan Note (Signed)
Rx for flonase to use. Advised can try zyrtec otc (no d part) also. Talked about gerd as a potential source which he does not think. Not concerning for covid-19 at this time but talked about symptoms to monitor for in regards to this.

## 2019-02-23 NOTE — Telephone Encounter (Signed)
Any URI symptoms are not to be in person for initial visit, would need some kind of phone or virtual visit.

## 2019-02-23 NOTE — Progress Notes (Signed)
Virtual Visit via Video Note  I connected with [name] on 02/23/19 at  1:40 PM EDT by a video enabled telemedicine application and verified that I am speaking with the correct person using two identifiers.  The patient and the provider were at separate locations throughout the entire encounter.   I discussed the limitations of evaluation and management by telemedicine and the availability of in person appointments. The patient expressed understanding and agreed to proceed.  History of Present Illness: The patient is a 68 y.o. man with visit for congestion in throat. Started in the middle of last night. He woke up feeling like he could not breathe. He has able to clear his throat some and get back to sleep but kept awakening. Throughout the day he is feeling some better. Has been out doing yard work a lot lately and some allergy draining. Denies fevers or chills. Denies cough or SOB. Denies abdominal symptoms such as nausea or vomiting or diarrhea. Has some nose drainage. Overall it is improving. Has tried nothing for it  Observations/Objective: Appearance: normal, breathing appears normal, casual grooming, abdomen does not appear distended, throat normal, memory normal, mental status is A and O times 3  Assessment and Plan: See problem oriented charting  Follow Up Instructions: rx for flonase  I discussed the assessment and treatment plan with the patient. The patient was provided an opportunity to ask questions and all were answered. The patient agreed with the plan and demonstrated an understanding of the instructions.   The patient was advised to call back or seek an in-person evaluation if the symptoms worsen or if the condition fails to improve as anticipated.  Hoyt Koch, MD

## 2019-02-23 NOTE — Telephone Encounter (Signed)
Can patient come in

## 2019-02-28 ENCOUNTER — Other Ambulatory Visit: Payer: Self-pay

## 2019-02-28 ENCOUNTER — Ambulatory Visit (HOSPITAL_COMMUNITY)
Admission: RE | Admit: 2019-02-28 | Discharge: 2019-02-28 | Disposition: A | Discharge status: Home / Self Care | Payer: PPO | Source: Ambulatory Visit | Attending: Physician Assistant | Admitting: Physician Assistant

## 2019-02-28 DIAGNOSIS — I4819 Other persistent atrial fibrillation: Secondary | ICD-10-CM

## 2019-02-28 NOTE — Progress Notes (Signed)
Electrophysiology TeleHealth Note   Due to national recommendations of social distancing due to [place] 19, Audio/video telehealth visit is felt to be most appropriate for this patient at this time.  See consent below from today for patient consent regarding telehealth for the [facility]. Consent obtained verbally.   Date:  02/28/2019   ID:  [name] Brown, DOB April 20, 1951, MRN [id number]  Location: home  Provider location: [address] [place], [place] [address] Evaluation Performed: Follow up  PCP:  Hoyt Koch, MD  Primary Cardiologist:  Dr Johnsie Cancel Primary Electrophysiologist: Dr Curt Bears   CC: Follow up for atrial fibrillation   History of Present Illness: [name] Brown is a 68 y.o. male who presents via audio/video conferencing for a telehealth visit today.   He has a history of coronary artery disease status post three-vessel CABG, hypertension, hyperlipidemia, and atrial fibrillation.  He was found to be in atrial fibrillation incidentally on office visit 08/30/2017.  He was put on Eliquis at the time.  Beta-blockers were not started due to resting bradycardia.  January 2020 he was noted increasing dyspnea on exertion.  He had a cardioversion 11/28/18 and was started on amiodarone. Since then, patient reports he has done very well with none of his usual afib symptoms of SOB and fatigue. He does report today that he was diagnosed with OSA a few years ago but could not tolerate CPAP therapy.  Today, he denies symptoms of palpitations, chest pain, shortness of breath, orthopnea, PND, lower extremity edema, claudication, dizziness, presyncope, syncope, bleeding, or neurologic sequela. The patient is tolerating medications without difficulties and is otherwise without complaint today.   he denies symptoms of cough, fevers, chills, or new SOB worrisome for COVID 19.     Atrial Fibrillation Risk Factors:  he does have symptoms or diagnosis of sleep apnea. he  is not compliant with CPAP therapy. he does not have a history of rheumatic fever. he does not have a history of alcohol use. The patient does not have a history of early familial atrial fibrillation or other arrhythmias.  he has a BMI of There is no height or weight on file to calculate BMI..  BP 136/60 Pulse 52 Provided by pt with home BP machine.  Past Medical History:  Diagnosis Date  . Aortic aneurysm ([place]) Endograf 2009  . Arthritis   . Atrial fibrillation ([place])   . Bradycardia   . CAD (coronary artery disease) CABG 03/2000   Median sternotomy for coronary artery bypass grafting x 3 (left  . HTN (hypertension)   . Hx of CABG 2001   severe 3 vessel disease  . Hx of colonic polyp 06/2010   Hyperplastic   . Hyperlipidemia   . Myocardial infarction [facility])    Past Surgical History:  Procedure Laterality Date  . CARDIOVERSION N/A 11/28/2018   Procedure: CARDIOVERSION;  Surgeon: Elouise Munroe, MD;  Location: [facility] ENDOSCOPY;  Service: Cardiovascular;  Laterality: N/A;  . COLONOSCOPY    . CORONARY ARTERY BYPASS GRAFT     AAA stent graft on 12/01/2007 at [facility]  coronary bypass in 2001. Dr. Roxy Manns.  Marland Kitchen FOOT SURGERY Left 07/19/2017  . LEFT HEART CATHETERIZATION WITH CORONARY ANGIOGRAM N/A 10/26/2013   Procedure: LEFT HEART CATHETERIZATION WITH CORONARY ANGIOGRAM;  Surgeon: Peter M Martinique, MD;  Location: [facility] CATH LAB;  Service: Cardiovascular;  Laterality: N/A;     Current Outpatient Medications  Medication Sig Dispense Refill  . amiodarone (PACERONE)  200 MG tablet Take 1 tablet (200 mg total) TWICE a day for one month, then reduce and take 1 tablet (200 mg total) ONCE daily 60 tablet 6  . apixaban (ELIQUIS) 5 MG TABS tablet Take 1 tablet (5 mg total) by mouth 2 (two) times daily. 180 tablet 3  . aspirin EC 81 MG EC tablet Take 1 tablet (81 mg total) by mouth daily.    . cholecalciferol (VITAMIN D) 25 MCG (1000 UT) tablet Take 1,000 Units by mouth daily.      . divalproex (DEPAKOTE ER) 500 MG 24 hr tablet Take 1 tablet (500 mg total) by mouth daily. NEEDS ANNUAL VISIT FOR FURTHER REFILLS 90 tablet 3  . Doxycycline Hyclate 50 MG TABS Take 50 mg by mouth daily.     Marland Kitchen ezetimibe (ZETIA) 10 MG tablet Take 1 tablet (10 mg total) by mouth daily. 90 tablet 3  . fluticasone (FLONASE) 50 MCG/ACT nasal spray Place 2 sprays into both nostrils daily. 16 g 6  . furosemide (LASIX) 20 MG tablet Take 1 tablet (20 mg total) by mouth 2 (two) times daily. 180 tablet 3  . gabapentin (NEURONTIN) 100 MG capsule TAKE 2 CAPSULES(200 MG) BY MOUTH AT BEDTIME (Patient taking differently: Take 200 mg by mouth at bedtime. TAKE 2 CAPSULES(200 MG) BY MOUTH AT BEDTIME) 180 capsule 3  . Glucosamine-Chondroitin (GLUCOSAMINE CHONDR COMPLEX PO) Take 500 mg by mouth 2 (two) times daily.     . hydrALAZINE (APRESOLINE) 50 MG tablet TAKE 1 TABLET(50 MG) BY MOUTH TWICE DAILY 180 tablet 3  . isosorbide mononitrate (IMDUR) 30 MG 24 hr tablet TAKE 1 TABLET BY MOUTH EVERY DAY (Patient taking differently: Take 30 mg by mouth daily. ) 90 tablet 2  . Multiple Vitamin (MULTIVITAMIN) capsule Take 1 capsule by mouth daily.    . niacin (NIASPAN) 1000 MG CR tablet Take 1,000 mg by mouth 2 (two) times daily.      . nitroGLYCERIN (NITROSTAT) 0.4 MG SL tablet DISSOLVE 1 TABLET UNDER THE TONGUE EVERY 5 MINUTES FOR 3 DOSES AS NEEDED FOR CHEST PAIN (Patient taking differently: Place 0.4 mg under the tongue every 5 (five) minutes as needed for chest pain. DISSOLVE 1 TABLET UNDER THE TONGUE EVERY 5 MINUTES FOR 3 DOSES AS NEEDED FOR CHEST PAIN) 25 tablet 6  . Omega-3 Fatty Acids (FISH OIL) 1200 MG CAPS Take 1,200 mg by mouth 2 (two) times daily.    . potassium chloride SA (K-DUR,KLOR-CON) 20 MEQ tablet TAKE 1 TABLET BY MOUTH EVERY DAY (Patient taking differently: Take 20 mEq by mouth daily. ) 90 tablet 3  . ramipril (ALTACE) 10 MG capsule TAKE 1 CAPSULE BY MOUTH TWICE DAILY (Patient taking differently: Take 10 mg by  mouth 2 (two) times daily. ) 180 capsule 3  . tamsulosin (FLOMAX) 0.4 MG CAPS capsule TAKE 1 CAPSULE(0.4 MG) BY MOUTH DAILY 90 capsule 1  . tiZANidine (ZANAFLEX) 4 MG tablet Take 1 tablet (4 mg total) by mouth every 8 (eight) hours as needed for muscle spasms. 270 tablet 3  . traZODone (DESYREL) 50 MG tablet TAKE 1/2 TO 1 TABLET(25 TO 50 MG) BY MOUTH AT BEDTIME AS NEEDED FOR SLEEP (Patient taking differently: Take 50 mg by mouth at bedtime. ) 90 tablet 1  . vitamin C (ASCORBIC ACID) 500 MG tablet Take 500 mg by mouth daily.     No current facility-administered medications for this encounter.     Allergies:   Statins   Social History:  The patient  reports that he quit smoking about 27 years ago. His smoking use included cigarettes. He has a 60.00 pack-year smoking history. He has never used smokeless tobacco. He reports that he does not drink alcohol or use drugs.   Family History:  The patient's  family history includes Heart attack in his father and mother; Heart disease in his father and mother.    ROS:  Please see the history of present illness.   All other systems are personally reviewed and negative.   Exam: Well appearing, alert and conversant, regular work of breathing,  good skin color  Recent Labs: 10/24/2018: TSH 1.11 11/28/2018: ALT 21; B Natriuretic Peptide 111.3; Hemoglobin 13.1; Platelets 177 12/21/2018: BUN 21; Creatinine, Ser 1.28; NT-Pro BNP 466; Potassium 4.9; Sodium 139  personally reviewed    Other studies personally reviewed: Additional studies/ records that were reviewed today include: Epic notes, echocardiogram  Echo 11/08/18 1. The left ventricle appears to be normal in size, have mild wall thickness, with normal systolic function of 16-10%. Echo evidence of indeterminate in diastolic filling patterns.  2. No regional wall motion abnormalities seen.  3. Right ventricular systolic pressure is is normal.  4. The right ventricle is mildly enlarged in size, has  normal wall thickness and normal systolic function.  5. Severely dilated left atrial size.  6. Moderately dilated right atrial size.  7. Mitral valve regurgitation is trivial by color flow Doppler.  8. The mitral valve normal in structure and function.  9. Normal tricuspid valve. 10. Tricuspid regurgitation is mild. 11. Aortic valve regurgitation is mild by color flow Doppler. 12. Aortic valve tricuspid. 13. Mild dilatation of the ascending aorta. 14. No atrial level shunt detected by color flow Doppler.   ASSESSMENT AND PLAN:  1. Persistent atrial fibrillation Patient doing well with no symptomatic episodes. Continue amiodarone 200 mg daily Continue Eliquis 5 mg BID  Will need repeat TSH, LFTs on f/u. He reports he just saw his ophthalmologist.   This patients CHA2DS2-VASc Score and unadjusted Ischemic Stroke Rate (% per year) is equal to 3.2 % stroke rate/year from a score of 3  Above score calculated as 1 point each if present [CHF, HTN, DM, Vascular=MI/PAD/Aortic Plaque, Age if 65-74, or Male] Above score calculated as 2 points each if present [Age > 75, or Stroke/TIA/TE]  2. OSA Patient reports he was diagnosed remotely but could not tolerate CPAP. Interested in oral device. Will refer to Dr Ron Parker for evaluation.  3. CAD No anginal symptoms. Continue present therapy and risk factor modification.  4. HTN Stable, no changes today.   COVID screen The patient does not have any symptoms that suggest any further testing/ screening at this time.  Social distancing reinforced today.    Follow-up with Dr Curt Bears and Dr Johnsie Cancel as scheduled. AF clinic in 6 months.  Current medicines are reviewed at length with the patient today.   The patient does not have concerns regarding his medicines.  The following changes were made today:  none  Labs/ tests ordered today include: none No orders of the defined types were placed in this encounter.   Patient Risk:  after full review of  this patients clinical status, I feel that they are at moderate risk at this time.   Today, I have spent 13 minutes with the patient with telehealth technology discussing atrial fibrillation, OSA, and COVID-19 precautions.    Gwenlyn Perking PA-C 02/28/2019 1:45 PM  Afib [facility] [place],  [place] [address] [phone]   I hereby voluntarily request, consent and authorize the [facility] Clinic and its employed or contracted physicians, physician assistants, nurse practitioners or other licensed health care professionals (the Practitioner), to provide me with telemedicine health care services (the "Services") as deemed necessary by the treating Practitioner. I acknowledge and consent to receive the Services by the Practitioner via telemedicine. I understand that the telemedicine visit will involve communicating with the Practitioner through live audiovisual communication technology and the disclosure of certain medical information by electronic transmission. I acknowledge that I have been given the opportunity to request an in-person assessment or other available alternative prior to the telemedicine visit and am voluntarily participating in the telemedicine visit.   I understand that I have the right to withhold or withdraw my consent to the use of telemedicine in the course of my care at any time, without affecting my right to future care or treatment, and that the Practitioner or I may terminate the telemedicine visit at any time. I understand that I have the right to inspect all information obtained and/or recorded in the course of the telemedicine visit and may receive copies of available information for a reasonable fee.  I understand that some of the potential risks of receiving the Services via telemedicine include:   Delay or interruption in medical evaluation due to technological equipment failure or disruption;  Information transmitted may  not be sufficient (e.g. poor resolution of images) to allow for appropriate medical decision making by the Practitioner; and/or  In rare instances, security protocols could fail, causing a breach of personal health information.   Furthermore, I acknowledge that it is my responsibility to provide information about my medical history, conditions and care that is complete and accurate to the best of my ability. I acknowledge that Practitioner's advice, recommendations, and/or decision may be based on factors not within their control, such as incomplete or inaccurate data provided by me or distortions of diagnostic images or specimens that may result from electronic transmissions. I understand that the practice of medicine is not an exact science and that Practitioner makes no warranties or guarantees regarding treatment outcomes. I acknowledge that I will receive a copy of this consent concurrently upon execution via email to the email address I last provided but may also request a printed copy by calling the office of the [facility].  I understand that my insurance will be billed for this visit.   I have read or had this consent read to me.  I understand the contents of this consent, which adequately explains the benefits and risks of the Services being provided via telemedicine.  I have been provided ample opportunity to ask questions regarding this consent and the Services and have had my questions answered to my satisfaction.  I give my informed consent for the services to be provided through the use of telemedicine in my medical care  By participating in this telemedicine visit I agree to the above.

## 2019-03-01 ENCOUNTER — Other Ambulatory Visit (HOSPITAL_COMMUNITY): Payer: Self-pay | Admitting: *Deleted

## 2019-03-01 NOTE — Telephone Encounter (Signed)
error 

## 2019-03-15 ENCOUNTER — Ambulatory Visit (INDEPENDENT_AMBULATORY_CARE_PROVIDER_SITE_OTHER): Payer: PPO | Admitting: Internal Medicine

## 2019-03-15 ENCOUNTER — Encounter: Payer: Self-pay | Admitting: Internal Medicine

## 2019-03-15 DIAGNOSIS — D509 Iron deficiency anemia, unspecified: Secondary | ICD-10-CM | POA: Diagnosis not present

## 2019-03-15 DIAGNOSIS — R6889 Other general symptoms and signs: Secondary | ICD-10-CM

## 2019-03-15 MED ORDER — MONTELUKAST SODIUM 10 MG PO TABS: 10.0000 mg | ORAL_TABLET | Freq: Every day | ORAL | 3 refills | Status: DC

## 2019-03-15 NOTE — Assessment & Plan Note (Signed)
Checking CBC and ferritin. Adjust as needed and restart iron for subjective fatigue.

## 2019-03-15 NOTE — Assessment & Plan Note (Signed)
Flonase did not help so trying singulair. Can continue afrin if needed.

## 2019-03-15 NOTE — Progress Notes (Signed)
Virtual Visit via Video Note  I connected with [name] Brown on 03/15/19 at  1:20 PM EDT by a video enabled telemedicine application and verified that I am speaking with the correct person using two identifiers.  The patient and the provider were at separate locations throughout the entire encounter.   I discussed the limitations of evaluation and management by telemedicine and the availability of in person appointments. The patient expressed understanding and agreed to proceed.  History of Present Illness: The patient is a 68 y.o. man with visit for throat and nose congestion which is preventing sleep. Started several weeks ago and we tried flonase. He did try this but it did not help and he felt that it made things worse. Denies headaches. Some tiredness due to lack of sleep. Denies fevers or chills or cough or SOB. Has used afrin otc which works but he knows he is not supposed to take this long term. Also has tried benadryl without relief. Overall it is stable but not improving. He is worried about anemia again as he stopped taking iron and now is feeling tired like he did before he needed it in the past.    Observations/Objective: Appearance: normal, breathing appears normal, casual grooming, abdomen doesnot appear distended, throat with clear drainage, mental status is A and O times 3, EOM intact  Assessment and Plan: See problem oriented charting  Follow Up Instructions: rx singulair and check labs to rule out anemia  I discussed the assessment and treatment plan with the patient. The patient was provided an opportunity to ask questions and all were answered. The patient agreed with the plan and demonstrated an understanding of the instructions.   The patient was advised to call back or seek an in-person evaluation if the symptoms worsen or if the condition fails to improve as anticipated.  Hoyt Koch, MD

## 2019-03-16 ENCOUNTER — Other Ambulatory Visit (INDEPENDENT_AMBULATORY_CARE_PROVIDER_SITE_OTHER): Payer: PPO

## 2019-03-16 DIAGNOSIS — D509 Iron deficiency anemia, unspecified: Secondary | ICD-10-CM

## 2019-03-16 LAB — COMPREHENSIVE METABOLIC PANEL
ALT: 16 U/L (ref 0–53)
AST: 15 U/L (ref 0–37)
Albumin: 4.3 g/dL (ref 3.5–5.2)
Alkaline Phosphatase: 43 U/L (ref 39–117)
BUN: 32 mg/dL — ABNORMAL HIGH (ref 6–23)
CO2: 26 mEq/L (ref 19–32)
Calcium: 9.5 mg/dL (ref 8.4–10.5)
Chloride: 103 mEq/L (ref 96–112)
Creatinine, Ser: 1.36 mg/dL (ref 0.40–1.50)
GFR: 52.23 mL/min — ABNORMAL LOW (ref >60.00)
Glucose, Bld: 84 mg/dL (ref 70–99)
Potassium: 5 mEq/L (ref 3.5–5.1)
Sodium: 138 mEq/L (ref 135–145)
Total Bilirubin: 0.6 mg/dL (ref 0.2–1.2)
Total Protein: 6.8 g/dL (ref 6.0–8.3)

## 2019-03-16 LAB — CBC
HCT: 36.7 % — ABNORMAL LOW (ref 39.0–52.0)
Hemoglobin: 12.8 g/dL — ABNORMAL LOW (ref 13.0–17.0)
MCHC: 34.8 g/dL (ref 30.0–36.0)
MCV: 99.3 fl (ref 78.0–100.0)
Platelets: 187 10*3/uL (ref 150.0–400.0)
RBC: 3.7 Mil/uL — ABNORMAL LOW (ref 4.22–5.81)
RDW: 15.4 % (ref 11.5–15.5)
WBC: 7.4 10*3/uL (ref 4.0–10.5)

## 2019-03-16 LAB — FERRITIN: Ferritin: 283.8 ng/mL (ref 22.0–322.0)

## 2019-03-16 LAB — BRAIN NATRIURETIC PEPTIDE: Pro B Natriuretic peptide (BNP): 287 pg/mL — ABNORMAL HIGH (ref 0.0–100.0)

## 2019-03-21 ENCOUNTER — Telehealth: Payer: Self-pay | Admitting: Internal Medicine

## 2019-03-21 MED ORDER — GABAPENTIN 100 MG PO CAPS: ORAL_CAPSULE | ORAL | 0 refills | Status: DC

## 2019-03-21 NOTE — Telephone Encounter (Signed)
Copied from [place] [id number]. Topic: Quick Communication - Rx Refill/Question >> Mar 21, 2019 10:03 AM Erick Blinks wrote: Medication: gabapentin (NEURONTIN) 100 MG capsule [[id number]] - Pt called stating that he is requesting more pills to last him until his upcoming appointment (03/27/2019). He is out. Please advise   Has the patient contacted their pharmacy? Yes  (Agent: If no, request that the patient contact the pharmacy for the refill.) (Agent: If yes, when and what did the pharmacy advise?)  Preferred Pharmacy (with phone number or street name):  [facility] [place], [address] AT [address] [place] [address] Phone: [phone] Fax: [phone]   Agent: Please be advised that RX refills may take up to 3 business days. We ask that you follow-up with your pharmacy.

## 2019-03-21 NOTE — Telephone Encounter (Signed)
Rx'd by Dr. Tamala Julian. Routing to sports med for review.

## 2019-03-21 NOTE — Telephone Encounter (Signed)
Refill sent to pt's pharmacy. 

## 2019-03-27 ENCOUNTER — Encounter: Payer: Self-pay | Admitting: Family Medicine

## 2019-03-27 ENCOUNTER — Ambulatory Visit (INDEPENDENT_AMBULATORY_CARE_PROVIDER_SITE_OTHER): Payer: PPO | Admitting: Family Medicine

## 2019-03-27 DIAGNOSIS — M5136 Other intervertebral disc degeneration, lumbar region: Secondary | ICD-10-CM | POA: Diagnosis not present

## 2019-03-27 MED ORDER — GABAPENTIN 300 MG PO CAPS: ORAL_CAPSULE | ORAL | 3 refills | Status: DC

## 2019-03-27 NOTE — Progress Notes (Signed)
Virtual Visit via Video Note  I connected with [name] Brown on 03/27/19 at 12:30 PM EDT by a video enabled telemedicine application and verified that I am speaking with the correct person using two identifiers.  Location: Patient: Patient was in home setting Provider: I was in office setting   I discussed the limitations of evaluation and management by telemedicine and the availability of in person appointments. The patient expressed understanding and agreed to proceed.  History of Present Illness: 68 year old male who has not been seen in over a year for bilateral hip pain.  Seem to be more secondary to some lumbar radiculopathy.  He has been taking gabapentin at night 200 mg.  Was working until the last month.  Started having worsening symptoms again.  Starting to wake him up at night and affecting daily activities.  Feels the gabapentin though was what was making the biggest difference.  Patient has been taking a muscle relaxer, temazepam intermittently.  No significant weakness of the lower extremities.    Observations/Objective: Patient appears to be fairly comfortable in his seat sitting.  Able to articulate well.  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  Increase gabapentin to 300 mg capsule at night.  Warned of potential side effects.  Can refill muscle relaxers when needed.  Encouraged the home exercises, declined formal physical therapy.  Follow-up again in 4 to 6 weeks to make sure it is helping appropriately.      I discussed the assessment and treatment plan with the patient. The patient was provided an opportunity to ask questions and all were answered. The patient agreed with the plan and demonstrated an understanding of the instructions.   The patient was advised to call back or seek an in-person evaluation if the symptoms worsen or if the condition fails to improve as anticipated.  I provided 11 minutes  of face-to-face time during this encounter.   Lyndal Pulley, DO

## 2019-04-09 ENCOUNTER — Ambulatory Visit (INDEPENDENT_AMBULATORY_CARE_PROVIDER_SITE_OTHER): Payer: PPO | Admitting: Internal Medicine

## 2019-04-09 ENCOUNTER — Encounter: Payer: Self-pay | Admitting: Internal Medicine

## 2019-04-09 DIAGNOSIS — M2550 Pain in unspecified joint: Secondary | ICD-10-CM | POA: Diagnosis not present

## 2019-04-09 DIAGNOSIS — Z1159 Encounter for screening for other viral diseases: Secondary | ICD-10-CM

## 2019-04-09 MED ORDER — DICLOFENAC SODIUM 1 % TD GEL: 2.0000 g | Freq: Four times a day (QID) | TRANSDERMAL | 3 refills | Status: AC

## 2019-04-09 NOTE — Progress Notes (Signed)
Virtual Visit via Video Note  I connected with [name] on 04/09/19 at  1:40 PM EDT by a video enabled telemedicine application and verified that I am speaking with the correct person using two identifiers.  The patient and the provider were at separate locations throughout the entire encounter.   I discussed the limitations of evaluation and management by telemedicine and the availability of in person appointments. The patient expressed understanding and agreed to proceed.  History of Present Illness: The patient is a 68 y.o. man with visit for concerns about arthritis pain. Started when he was sleeping through the night again after starting singulair for throat discomfort. He is waking up stiff and lots of pain in the hips and arms and wrists especially when working a lot. Has no swelling or redness around the joint. Denies fevers or chills. Denies hours of stiffness in the morning. Overall it is stable. Has tried tylenol which does not help much.  Observations/Objective: Appearance: normal, breathing appears normal, casual grooming, abdomen does not appear distended, throat normal, mental status is A and O times 3  Assessment and Plan: See problem oriented charting  Follow Up Instructions: checking labs to rule out auto-immune arthritis  I discussed the assessment and treatment plan with the patient. The patient was provided an opportunity to ask questions and all were answered. The patient agreed with the plan and demonstrated an understanding of the instructions.   The patient was advised to call back or seek an in-person evaluation if the symptoms worsen or if the condition fails to improve as anticipated.  Hoyt Koch, MD

## 2019-04-09 NOTE — Assessment & Plan Note (Addendum)
Checking ANA, RF to rule out auto-immune cause. Could be related to some worsening arthritis due to longer stasis (sleeping) at night time without getting up and moving. Rx voltaren gel to help with pain.

## 2019-04-10 ENCOUNTER — Other Ambulatory Visit: Payer: PPO

## 2019-04-10 DIAGNOSIS — Z1159 Encounter for screening for other viral diseases: Secondary | ICD-10-CM

## 2019-04-10 DIAGNOSIS — M2550 Pain in unspecified joint: Secondary | ICD-10-CM

## 2019-04-12 LAB — ANA, IFA COMPREHENSIVE PANEL
Anti Nuclear Antibody (ANA): NEGATIVE
ENA SM Ab Ser-aCnc: <1 AI
SM/RNP: <1 AI
SSA (Ro) (ENA) Antibody, IgG: <1 AI
SSB (La) (ENA) Antibody, IgG: <1 AI
Scleroderma (Scl-70) (ENA) Antibody, IgG: <1 AI
ds DNA Ab: 3 IU/mL

## 2019-04-12 LAB — RHEUMATOID FACTOR: Rheumatoid fact SerPl-aCnc: <14 IU/mL (ref <14)

## 2019-04-12 LAB — HEPATITIS C ANTIBODY
Hepatitis C Ab: NONREACTIVE
SIGNAL TO CUT-OFF: 0.21 (ref <1.00)

## 2019-04-16 ENCOUNTER — Encounter: Payer: Self-pay | Admitting: Internal Medicine

## 2019-04-16 ENCOUNTER — Ambulatory Visit (INDEPENDENT_AMBULATORY_CARE_PROVIDER_SITE_OTHER): Payer: PPO | Admitting: Internal Medicine

## 2019-04-16 DIAGNOSIS — M2550 Pain in unspecified joint: Secondary | ICD-10-CM

## 2019-04-16 MED ORDER — PREDNISONE 20 MG PO TABS: 40.0000 mg | ORAL_TABLET | Freq: Every day | ORAL | 0 refills | Status: DC

## 2019-04-16 NOTE — Progress Notes (Signed)
Virtual Visit via Video Note  I connected with [name] Brown on 04/16/19 at  1:20 PM EDT by a video enabled telemedicine application and verified that I am speaking with the correct person using two identifiers.  The patient and the provider were at separate locations throughout the entire encounter.   I discussed the limitations of evaluation and management by telemedicine and the availability of in person appointments. The patient expressed understanding and agreed to proceed.  History of Present Illness: The patient is a 68 y.o. man with visit for joint pain and stiffness. He is now sleeping better due to singulair and this was when it started. He is waking up with stiffness. Started within the last 3-4 weeks. Testing done at our office for auto-immune arthritis and this was negative. He is using tylenol but excessively 8-10 some days. Denies fevers or chills. Denies swelling of his joints. Especially painful at his hips and after exercise. Has not changed diet. Overall it is worsening. Has tried tylenol which helps for a short time.  Observations/Objective: Appearance: normal, breathing appears normal, casual grooming, abdomen does not appear distended, throat normal, no swelling in any hand joints or elbow joints appreciated, mental status is A and O times 3  Assessment and Plan: See problem oriented charting  Follow Up Instructions: rx prednisone 5 day course, refer back to sports medicine to see if there is any treatment available to help, discussed safe limits to tylenol dosing  I discussed the assessment and treatment plan with the patient. The patient was provided an opportunity to ask questions and all were answered. The patient agreed with the plan and demonstrated an understanding of the instructions.   The patient was advised to call back or seek an in-person evaluation if the symptoms worsen or if the condition fails to improve as anticipated.  Hoyt Koch, MD

## 2019-04-16 NOTE — Assessment & Plan Note (Signed)
Should not be on NSAIDs. Discussed safe tylenol limits. Rx prednisone 5 day course. Refer back to sports medicine for evaluation and change in symptoms.

## 2019-04-23 ENCOUNTER — Encounter: Payer: Self-pay | Admitting: Internal Medicine

## 2019-04-23 ENCOUNTER — Ambulatory Visit (INDEPENDENT_AMBULATORY_CARE_PROVIDER_SITE_OTHER): Payer: PPO | Admitting: Internal Medicine

## 2019-04-23 DIAGNOSIS — M2550 Pain in unspecified joint: Secondary | ICD-10-CM

## 2019-04-23 NOTE — Assessment & Plan Note (Signed)
Much improved after steroid course. Using tylenol as needed. Advised to schedule with sports medicine to evaluate joints.

## 2019-04-23 NOTE — Progress Notes (Signed)
Virtual Visit via Video Note  I connected with [name] Brown on 04/23/19 at  1:40 PM EDT by a video enabled telemedicine application and verified that I am speaking with the correct person using two identifiers.  The patient and the provider were at separate locations throughout the entire encounter.   I discussed the limitations of evaluation and management by telemedicine and the availability of in person appointments. The patient expressed understanding and agreed to proceed.  History of Present Illness: The patient is a 68 y.o. man with visit for arthritis pain. Started weeks to months ago. Has been sleeping better due to lack of throat congestion. Gets stiffness that lasts for hours in the morning. Has recently done a 5 day course of prednisone which has helped dramatically. He denies much joint pain currently. He has dramatically cut down on tylenol without increase in pain. Denies swelling or redness in joints. Overall it is much improved. Has tried recent 5 day prednisone course.   Observations/Objective: Appearance: normal, breathing appears normal, casual grooming, abdomen does not appear distended, throat normal, memory normal, mental status is a and o times 3  Assessment and Plan: See problem oriented charting  Follow Up Instructions: advised to see sports medicine  I discussed the assessment and treatment plan with the patient. The patient was provided an opportunity to ask questions and all were answered. The patient agreed with the plan and demonstrated an understanding of the instructions.   The patient was advised to call back or seek an in-person evaluation if the symptoms worsen or if the condition fails to improve as anticipated.  Hoyt Koch, MD

## 2019-04-27 ENCOUNTER — Encounter: Payer: Self-pay | Admitting: Family Medicine

## 2019-04-27 ENCOUNTER — Ambulatory Visit (INDEPENDENT_AMBULATORY_CARE_PROVIDER_SITE_OTHER): Payer: PPO | Admitting: Family Medicine

## 2019-04-27 DIAGNOSIS — M5136 Other intervertebral disc degeneration, lumbar region: Secondary | ICD-10-CM

## 2019-04-27 DIAGNOSIS — M2550 Pain in unspecified joint: Secondary | ICD-10-CM | POA: Diagnosis not present

## 2019-04-27 NOTE — Progress Notes (Signed)
Virtual Visit via Video Note  I connected with [name] Brown on 04/27/19 at 12:15 PM EDT by a video enabled telemedicine application and verified that I am speaking with the correct person using two identifiers.  Location: Patient: New home setting Provider: Office setting   I discussed the limitations of evaluation and management by telemedicine and the availability of in person appointments. The patient expressed understanding and agreed to proceed.  History of Present Illness: 68 year old gentleman with significant amount of arthralgia.  Been seen primary care provider virtually.  Had laboratory work-up to rule out autoimmune which was all negative at the moment.  Patient continues to have pain.  Tries to work in the yard and pain seems to get worse.  Rates the severity of pain is 9 out of 10 the day after doing activity.  Patient states that unfortunately feels like the medications do not seem to make a big difference.  Patient did do well though with a prescription of prednisone recently.   Observations/Objective: Alert and oriented x3, seen comfortable in the house setting.   Assessment and Plan: Arthralgias, out of proportion.  Many different comorbidities.  Discussed with patient about increasing gabapentin.  Unable to do oral anti-inflammatories secondary to blood thinner.  Can try topical anti-inflammatories.  X-rays for back and pelvis ordered today to further evaluate.  May need epidurals in the long run of the back with advanced imaging.  Most the pain seems to be lower extremities.  I do believe that patient's other chronic comorbidities are likely contributing    Follow Up Instructions: 2 weeks virtual    I discussed the assessment and treatment plan with the patient. The patient was provided an opportunity to ask questions and all were answered. The patient agreed with the plan and demonstrated an understanding of the instructions.   The patient was advised to call back or  seek an in-person evaluation if the symptoms worsen or if the condition fails to improve as anticipated.  I provided 31 minutes of face-to-face time during this encounter.   Lyndal Pulley, DO

## 2019-05-01 ENCOUNTER — Other Ambulatory Visit: Payer: Self-pay

## 2019-05-01 ENCOUNTER — Ambulatory Visit (INDEPENDENT_AMBULATORY_CARE_PROVIDER_SITE_OTHER)
Admission: RE | Admit: 2019-05-01 | Discharge: 2019-05-01 | Disposition: A | Discharge status: Home / Self Care | Payer: PPO | Source: Ambulatory Visit | Attending: Family Medicine | Admitting: Family Medicine

## 2019-05-01 ENCOUNTER — Other Ambulatory Visit: Payer: Self-pay | Admitting: *Deleted

## 2019-05-01 DIAGNOSIS — M5136 Other intervertebral disc degeneration, lumbar region: Secondary | ICD-10-CM | POA: Diagnosis not present

## 2019-05-01 DIAGNOSIS — M25551 Pain in right hip: Secondary | ICD-10-CM | POA: Diagnosis not present

## 2019-05-01 DIAGNOSIS — M47816 Spondylosis without myelopathy or radiculopathy, lumbar region: Secondary | ICD-10-CM | POA: Diagnosis not present

## 2019-05-01 DIAGNOSIS — M25552 Pain in left hip: Secondary | ICD-10-CM | POA: Diagnosis not present

## 2019-05-06 ENCOUNTER — Other Ambulatory Visit: Payer: Self-pay | Admitting: Cardiovascular Disease

## 2019-05-06 ENCOUNTER — Other Ambulatory Visit: Payer: Self-pay | Admitting: Internal Medicine

## 2019-05-06 DIAGNOSIS — G4700 Insomnia, unspecified: Secondary | ICD-10-CM

## 2019-05-11 ENCOUNTER — Other Ambulatory Visit: Payer: Self-pay

## 2019-05-15 ENCOUNTER — Ambulatory Visit (INDEPENDENT_AMBULATORY_CARE_PROVIDER_SITE_OTHER): Payer: PPO | Admitting: Family Medicine

## 2019-05-15 ENCOUNTER — Encounter: Payer: Self-pay | Admitting: Family Medicine

## 2019-05-15 DIAGNOSIS — M5136 Other intervertebral disc degeneration, lumbar region: Secondary | ICD-10-CM | POA: Diagnosis not present

## 2019-05-15 DIAGNOSIS — M5416 Radiculopathy, lumbar region: Secondary | ICD-10-CM

## 2019-05-15 NOTE — Progress Notes (Signed)
Virtual Visit via Video Note  I connected with [name] Brown on 05/15/19 at 12:30 PM EDT by a video enabled telemedicine application and verified that I am speaking with the correct person using two identifiers.  Location: Patient: home  Provider: office    I discussed the limitations of evaluation and management by telemedicine and the availability of in person appointments. The patient expressed understanding and agreed to proceed.  History of Present Illness: Patient is back pain is constant.  States that it is there.  States that the gabapentin has helped with some of the leg pain and has allowed him to walk on a more regular basis.  Patient though states that the increase in gabapentin has causes drowsiness and does not think he can continue on these medications long-term.    Observations/Objective: Patient is sitting, no significant shortness of breath, alert and oriented  Assessment and Plan:MRI of the lumbar is ordered today with him continuing to have difficulty.  X-rays do show moderate amount of osteoarthritic changes and I am concerned for more of a spinal.  Patient did respond fairly well to the increase in gabapentin but I do feel an epidural would be warranted.  Patient is on a blood thinner and would need to be bridged likely but will talk to his other providers about this.  We will wait until patient has the MRI done.  Patient is in agreement with the plan. A   Follow Up Instructions: Patient will follow-up universally after the MRI or after the epidurals    I discussed the assessment and treatment plan with the patient. The patient was provided an opportunity to ask questions and all were answered. The patient agreed with the plan and demonstrated an understanding of the instructions.   The patient was advised to call back or seek an in-person evaluation if the symptoms worsen or if the condition fails to improve as anticipated.  I provided 25 minutes of face-to-face  time during this encounter.   Lyndal Pulley, DO

## 2019-05-15 NOTE — Assessment & Plan Note (Signed)
MRI of the lumbar is ordered today with him continuing to have difficulty.  X-rays do show moderate amount of osteoarthritic changes and I am concerned for more of a spinal.  Patient did respond fairly well to the increase in gabapentin but I do feel an epidural would be warranted.  Patient is on a blood thinner and would need to be bridged likely but will talk to his other providers about this.  We will wait until patient has the MRI done.  Patient is in agreement with the plan.

## 2019-05-25 ENCOUNTER — Telehealth: Payer: Self-pay | Admitting: Internal Medicine

## 2019-05-25 MED ORDER — GABAPENTIN 300 MG PO CAPS: 300.0000 mg | ORAL_CAPSULE | Freq: Two times a day (BID) | ORAL | 3 refills | Status: DC

## 2019-05-25 NOTE — Telephone Encounter (Signed)
Copied from [place] [phone]. Topic: General - Other >> May 25, 2019  1:28 PM Keene Breath wrote: Reason for CRM: Patient called to ask the nurse to call him regarding his medication, gabapentin (NEURONTIN) 300 MG capsule.  He would like a new script for 300 mg/2x daily.  Please advise and call patient back to discuss.  CB# [phone]

## 2019-05-25 NOTE — Telephone Encounter (Signed)
Sent in

## 2019-06-11 ENCOUNTER — Other Ambulatory Visit: Payer: Self-pay

## 2019-06-11 ENCOUNTER — Ambulatory Visit
Admission: RE | Admit: 2019-06-11 | Discharge: 2019-06-11 | Disposition: A | Discharge status: Home / Self Care | Payer: PPO | Source: Ambulatory Visit | Attending: Family Medicine | Admitting: Family Medicine

## 2019-06-11 DIAGNOSIS — M5416 Radiculopathy, lumbar region: Secondary | ICD-10-CM

## 2019-06-11 DIAGNOSIS — M5117 Intervertebral disc disorders with radiculopathy, lumbosacral region: Secondary | ICD-10-CM | POA: Diagnosis not present

## 2019-06-11 DIAGNOSIS — M4726 Other spondylosis with radiculopathy, lumbar region: Secondary | ICD-10-CM | POA: Diagnosis not present

## 2019-06-12 ENCOUNTER — Other Ambulatory Visit: Payer: Self-pay | Admitting: Internal Medicine

## 2019-06-19 ENCOUNTER — Encounter: Payer: Self-pay | Admitting: Family Medicine

## 2019-06-22 ENCOUNTER — Other Ambulatory Visit: Payer: Self-pay

## 2019-06-22 DIAGNOSIS — M549 Dorsalgia, unspecified: Secondary | ICD-10-CM

## 2019-06-28 ENCOUNTER — Telehealth (HOSPITAL_COMMUNITY): Payer: Self-pay | Admitting: *Deleted

## 2019-06-28 ENCOUNTER — Telehealth: Payer: Self-pay | Admitting: *Deleted

## 2019-06-28 DIAGNOSIS — I10 Essential (primary) hypertension: Secondary | ICD-10-CM

## 2019-06-28 NOTE — Telephone Encounter (Signed)
Patient previously referred to Dr. Ron Parker for oral device to treat sleep apnea due to previous intolerance of CPAP (had HST in 2018). The oral device was going to be too costly therefore patient is willing to have consult with sleep physician to be retried on CPAP machine. Will forward to sleep coordinator for next steps.

## 2019-06-28 NOTE — Telephone Encounter (Signed)
-----   Message from Will Meredith Leeds, MD sent at 06/28/2019  1:55 PM EDT ----- Regarding: RE: sleep study help OK to order under my name. Thanks. ----- Message ----- From: [name], CMA Sent: 06/28/2019  12:58 PM EDT To: Constance Haw, MD, Stanton Kidney, RN Subject: FW: sleep study help                           Please advise ----- Message ----- From: Sueanne Margarita, MD Sent: 06/28/2019  12:46 PM EDT To: [name], CMA Subject: RE: sleep study help                           Looks like Dr. Curt Bears has seen patient - see if he is willing to order split night sleep study then I can see once on CPAP  Traci ----- Message ----- From: [name], CMA Sent: 06/28/2019  12:30 PM EDT To: Sueanne Margarita, MD Subject: FW: sleep study help                           Please advise. Thanks  ----- Message ----- From: Juluis Mire, RN Sent: 06/28/2019  11:02 AM EDT To: [name], CMA Subject: sleep study help                               Pt had home sleep study in 2018 -- (under media tab 02/02/17) didn't tolerate CPAP tried back then -- is now ready to retry but wants to establish with [facility]  - not sure what all you need but pt would like consult to figure out a different set up so that he can use cpap. Thanks [employer]

## 2019-06-28 NOTE — Telephone Encounter (Signed)
Constance Haw, MD  [name], CMA; Stanton Kidney, RN        OK to order under my name. Thanks.    Split night sent to sleep pool.

## 2019-06-29 NOTE — Progress Notes (Signed)
Patient ID: [name] Brown, male   DOB: 1951/01/09, 68 y.o.   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  Last . Cath by Dr Martinique 2015  showed stable disease for medical Rx The free RIMA to circumflex was occluded with patent LIMA to LAD and RIMA to RCA Mid circumflex with 30-40% disease and OM3 with 50-60% PVD followed by Dr Doren Custard post EVAR    02/2018  Duplex  Left ICA 40-59% stenosis.   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 This occurs when traveling Walking up drive way He has noted tightness in chest that may be wheezing. Functional status has Decreased quite a bit Myovue non ischemic with normal EF and echo same. BNP only 288 CXR Elevated left hemidiaphragm and scarring   Started on amiodarone by Dr Curt Bears for afib 12/06/18 not thought to be an ablation candidate as LA is 64 mm by echo TSH normal January 2020 and LFTls normal 03/16/19 Has seen eye doctor Needs f/u amiodarone labs    Having back issues and had injection Also getting cyst removed from scalp with Dr Marlou Starks. Holding eliquis with no issues for 3 days for each    ROS: Denies fever, malais, weight loss, blurry vision, decreased visual acuity, cough, sputum, SOB, hemoptysis, pleuritic pain, palpitaitons, heartburn, abdominal pain, melena, lower extremity edema, claudication, or rash.  All other systems reviewed and negative  General: BP 116/82   Pulse (!) 57   Ht 5\' 10"  (1.778 m)   Wt 212 lb (96.2 kg)   SpO2 94%   BMI 30.42 kg/m  Affect appropriate Healthy:  appears stated age 33: normal Neck supple  with no adenopathy JVP normal left  bruits no thyromegaly Lungs mild upper lobe  wheezing and good diaphragmatic motion Heart:  S1/S2 SEM murmur, no rub, gallop or click PMI normal Abdomen: benighn, BS positve, no tenderness, no AAA no bruit.  No HSM or HJR Distal pulses intact with no bruits No edema Neuro non-focal Skin warm and dry No muscular weakness   Current Outpatient Medications  Medication Sig Dispense Refill  . amiodarone (PACERONE) 200 MG tablet Take 1 tablet (200 mg total) TWICE a day for one month, then reduce and take 1 tablet (200 mg total) ONCE daily 60 tablet 6  . apixaban (ELIQUIS) 5 MG TABS tablet Take 1 tablet (5 mg total) by mouth 2 (two) times daily. 180 tablet 3  . aspirin EC 81 MG EC tablet Take 1 tablet (81 mg total) by mouth daily.    . cholecalciferol (VITAMIN D) 25 MCG (1000 UT) tablet Take 1,000 Units by mouth daily.     . diclofenac sodium (VOLTAREN) 1 % GEL Apply 2 g topically 4 (four) times daily. 100 g 3  . divalproex (DEPAKOTE ER) 500 MG 24 hr tablet Take 1 tablet (500 mg total) by mouth daily. NEEDS ANNUAL VISIT FOR FURTHER REFILLS 90 tablet 3  . Doxycycline Hyclate 50 MG TABS Take 50 mg by mouth daily.     Marland Kitchen  ezetimibe (ZETIA) 10 MG tablet Take 1 tablet (10 mg total) by mouth daily. 90 tablet 3  . gabapentin (NEURONTIN) 300 MG capsule Take 1 capsule (300 mg total) by mouth 2 (two) times daily. 60 capsule 3  . Glucosamine-Chondroitin (GLUCOSAMINE CHONDR COMPLEX PO) Take 500 mg by mouth 2 (two) times daily.     . hydrALAZINE (APRESOLINE) 50 MG tablet TAKE 1 TABLET(50 MG) BY MOUTH TWICE DAILY 180 tablet 3  . isosorbide mononitrate (IMDUR) 30 MG 24 hr tablet TAKE 1 TABLET BY MOUTH EVERY DAY 90 tablet 2  . montelukast (SINGULAIR) 10 MG tablet Take 1 tablet (10 mg total) by mouth at bedtime. 30 tablet 3  . Multiple Vitamin (MULTIVITAMIN) capsule Take 1 capsule by mouth daily.    . niacin (NIASPAN) 1000 MG CR tablet Take 1,000 mg by mouth 2 (two) times  daily.      . nitroGLYCERIN (NITROSTAT) 0.4 MG SL tablet DISSOLVE 1 TABLET UNDER THE TONGUE EVERY 5 MINUTES FOR 3 DOSES AS NEEDED FOR CHEST PAIN 25 tablet 6  . Omega-3 Fatty Acids (FISH OIL) 1200 MG CAPS Take 1,200 mg by mouth 2 (two) times daily.    . potassium chloride SA (K-DUR,KLOR-CON) 20 MEQ tablet TAKE 1 TABLET BY MOUTH EVERY DAY 90 tablet 3  . predniSONE (DELTASONE) 20 MG tablet Take 2 tablets (40 mg total) by mouth daily with breakfast. 10 tablet 0  . ramipril (ALTACE) 10 MG capsule TAKE 1 CAPSULE BY MOUTH TWICE DAILY 180 capsule 3  . tamsulosin (FLOMAX) 0.4 MG CAPS capsule TAKE 1 CAPSULE(0.4 MG) BY MOUTH DAILY 90 capsule 1  . tiZANidine (ZANAFLEX) 4 MG tablet Take 1 tablet (4 mg total) by mouth every 8 (eight) hours as needed for muscle spasms. 270 tablet 3  . traZODone (DESYREL) 50 MG tablet TAKE 1/2 TO 1 TABLET(25 TO 50 MG) BY MOUTH AT BEDTIME AS NEEDED FOR SLEEP 90 tablet 1  . vitamin C (ASCORBIC ACID) 500 MG tablet Take 500 mg by mouth daily.     No current facility-administered medications for this visit.     Allergies  Statins  Electrocardiogram:  07/17/14  SR possible old IMI no change from 2014   10/15/14 SR rate 59 PR 224  LAFB   Assessment and Plan  CAD: Occluded free radial to OM EF normal by TTE 02/27/18  Normal myovue 11/08/18   AAA:  Post EVAR f/u [place] residual lumen 3.2 cm on duplex 05/10/18  Bruit: duplex [id number] stable [id number] LICA stenosis f/u duplex ordered   HTN: Well controlled. Hydralazine helps  Continue current medications and low sodium Dash type diet.    Chol:   Cholesterol is at goal.  Continue current dose of statin and diet Rx.  No myalgias or side effects.  F/U  LFT's in 6 months. Lab Results  Component Value Date   LDLCALC 84 11/29/2018  Labs with primary   Urology:  Urinary frequency at night better on flomax f/u primary   Afib: Diagnosed November 2018  Asymptomatic  CHA2Vasc 3  eliquis 5 bid known History of SSS  LA 60 mm severely dilated  rate control and anticoagulation strategy Holter 11/08/18 reviewed Average HR 54 bpm longest pause 3.6 seconds referred to EP Seen by [facility] and started on amiodarone    Dyspnea:  No ischemia on myovue and normal EF by TTE 11/08/18 as well CXR showed chronic Elevation in left hemidiaphragm with atelectasis and scarring Dyspnea would not appear to be Related to cardiac abnormality BNP  03/16/19 only 287  Anemia:  Hct 36.7 03/16/19 normal ferritin f/u primary         Ordered: Amiodarone labs and PFTls with DLCO Carotid duplex     Jenkins Rouge

## 2019-07-02 ENCOUNTER — Other Ambulatory Visit: Payer: Self-pay | Admitting: Family Medicine

## 2019-07-02 DIAGNOSIS — M549 Dorsalgia, unspecified: Secondary | ICD-10-CM

## 2019-07-03 ENCOUNTER — Telehealth: Payer: Self-pay | Admitting: *Deleted

## 2019-07-03 NOTE — Telephone Encounter (Signed)
   [facility] Pre-operative Risk Assessment    Request for surgical clearance:  1. What type of surgery is being performed? EXCISION OF 1.5 CM CYST ON THE RIGHT TEMPORAL SCALP AREA  2. When is this surgery scheduled? TBD   3. What type of clearance is required (medical clearance vs. Pharmacy clearance to hold med vs. Both)? BOTH  4. Are there any medications that need to be held prior to surgery and how long? ELIQUIS  2 DAYS PRIOR TO SURGERY  5. Practice name and name of physician performing surgery? [facility]; DR. PAUL TOTH   6. What is your office phone number [phone]    7.   What is your office fax number [phone]  8.   Anesthesia type (None, local, MAC, general) ? GENERAL    Julaine Hua 07/03/2019, 9:39 AM  _________________________________________________________________   (provider comments below)

## 2019-07-03 NOTE — Telephone Encounter (Signed)
Patient with diagnosis of atrial fibrillation on  for eliquis anticoagulation.    Procedure: cyst excision Date of procedure: TBD  CHADS2-VASc score of  3 (HTN, AGE, CAD)  CrCl 67 Platelet count 187  Per office protocol, patient can hold Eliquis for 2 days prior to procedure.   Patient will not need bridging with Lovenox (enoxaparin) around procedure.

## 2019-07-03 NOTE — Telephone Encounter (Signed)
RE: split night [name] Brown, CMA  [name], CMA        Gae Bon this patient will need OV to document that he did not get the oral appliance and wants to try CPAP, therefore a sleep study will be ordered. This cannot be done through a phone note.OR Dr Curt Bears can addend his last office note.

## 2019-07-03 NOTE — Telephone Encounter (Signed)
RE: split night Camnitz, Ocie Doyne, MD  [name], CMA; Stanton Kidney, RN        Please see the note from 5/20 tele visit with Adline Peals for documentation.    Message sent to Erasmo Score to send to insurance.

## 2019-07-04 ENCOUNTER — Ambulatory Visit: Payer: Self-pay | Admitting: General Surgery

## 2019-07-04 NOTE — Telephone Encounter (Signed)
Left message for the patient to call back and speak to the on call preop APP 

## 2019-07-05 ENCOUNTER — Telehealth: Payer: Self-pay | Admitting: *Deleted

## 2019-07-05 ENCOUNTER — Telehealth: Payer: Self-pay

## 2019-07-05 NOTE — Telephone Encounter (Signed)
Will await clarification.

## 2019-07-05 NOTE — Telephone Encounter (Signed)

## 2019-07-05 NOTE — Telephone Encounter (Signed)
Second request for different procedure. Please comment on eliquis. He stated earlier that he will likely not be able to schedule both procedures back-to-back.

## 2019-07-05 NOTE — Telephone Encounter (Signed)
   [facility] Pre-operative Risk Assessment    Request for surgical clearance:  1. What type of surgery is being performed? Nerve root block   2. When is this surgery scheduled? TBD   3. What type of clearance is required (medical clearance vs. Pharmacy clearance to hold med vs. Both)? Pharmacy  4. Are there any medications that need to be held prior to surgery and how long? Eliquis 48 hours prior to procedure   5. Practice name and name of physician performing surgery? [facility]   6. What is your office phone number [phone]    7.   What is your office fax number [phone]  8.   Anesthesia type (None, local, MAC, general) ? Local   Mady Haagensen 07/05/2019, 12:00 PM  _________________________________________________________________   (provider comments below)

## 2019-07-05 NOTE — Telephone Encounter (Addendum)
Patient with diagnosis of afib on Eliquis for anticoagulation.    Procedure: Nerve root block  Date of procedure: TBD  CHADS2-VASc score of  3 (HTN, AGE, CAD)  CrCl 54 ml/min  Per office protocol, patient can hold Eliquis for 3 days prior to procedure.   (assuming it is a spinal injection-I did leave a message with the office for clarification)  Addendum- spoke with office who did confirm injection was spinal. Advised them that patient will need to hold 3 days. They will let patient know.

## 2019-07-05 NOTE — Telephone Encounter (Signed)
   Primary Cardiologist: Jenkins Rouge, MD  Chart reviewed as part of pre-operative protocol coverage. Patient was contacted 07/05/2019 in reference to pre-operative risk assessment for pending surgery as outlined below.  [name] Brown was last seen on 02/28/19 by Malka So PAC.  Since that day, [name] Brown has done well.  He has a history of CAD s/p CABG with normal nuclear stress test in 10/2018. He can complete more than 4.0 METS without anginal symptoms.   Per our clinical pharmacist team: Patient with diagnosis of atrial fibrillation on  for eliquis anticoagulation.    Procedure: cyst excision Date of procedure: TBD  CHADS2-VASc score of  3 (HTN, AGE, CAD)  CrCl 67 Platelet count 187  Per office protocol, patient can hold Eliquis for 2 days prior to procedure.   Patient will not need bridging with Lovenox (enoxaparin) around procedure.   Therefore, based on ACC/AHA guidelines, the patient would be at acceptable risk for the planned procedure without further cardiovascular testing.   I will route this recommendation to the requesting party via Epic fax function and remove from pre-op pool.  Please call with questions.  Tami Lin Rithik Odea, PA 07/05/2019, 8:28 AM

## 2019-07-06 NOTE — Telephone Encounter (Signed)
Left a detailed message for the patient about hold his Eliquis for 3 days prior his Nerve Root block and if he has any questions to give our office a call back and to ask for the Pre Op pool and someone will be glad to assist him

## 2019-07-06 NOTE — Telephone Encounter (Signed)
Callback pool, Please inform patient. Note, this clearance is for a different procedure than what he is having tomorrow.

## 2019-07-09 ENCOUNTER — Inpatient Hospital Stay: Admission: RE | Admit: 2019-07-09 | Payer: PPO | Source: Ambulatory Visit

## 2019-07-10 ENCOUNTER — Other Ambulatory Visit: Payer: Self-pay

## 2019-07-10 ENCOUNTER — Ambulatory Visit
Admission: RE | Admit: 2019-07-10 | Discharge: 2019-07-10 | Disposition: A | Discharge status: Home / Self Care | Payer: PPO | Source: Ambulatory Visit | Attending: Family Medicine | Admitting: Family Medicine

## 2019-07-10 DIAGNOSIS — M549 Dorsalgia, unspecified: Secondary | ICD-10-CM

## 2019-07-10 DIAGNOSIS — M5126 Other intervertebral disc displacement, lumbar region: Secondary | ICD-10-CM | POA: Diagnosis not present

## 2019-07-10 MED ORDER — METHYLPREDNISOLONE ACETATE 40 MG/ML INJ SUSP (RADIOLOG
120.0000 mg | Freq: Once | INTRAMUSCULAR | Status: AC
  Administered 2019-07-10: 11:00:00 120 mg via EPIDURAL

## 2019-07-10 MED ORDER — MONTELUKAST SODIUM 10 MG PO TABS: 10.0000 mg | ORAL_TABLET | Freq: Every day | ORAL | 3 refills | Status: DC

## 2019-07-10 MED ORDER — IOPAMIDOL (ISOVUE-M 200) INJECTION 41%
1.0000 mL | Freq: Once | INTRAMUSCULAR | Status: AC
  Administered 2019-07-10: 1 mL via EPIDURAL

## 2019-07-10 NOTE — Discharge Instructions (Signed)

## 2019-07-11 ENCOUNTER — Ambulatory Visit: Payer: PPO | Admitting: Cardiovascular Disease

## 2019-07-11 ENCOUNTER — Encounter: Payer: Self-pay | Admitting: Cardiovascular Disease

## 2019-07-11 VITALS — BP 116/82 | HR 57 | Ht 70.0 in | Wt 212.0 lb

## 2019-07-11 DIAGNOSIS — Z79899 Other long term (current) drug therapy: Secondary | ICD-10-CM | POA: Diagnosis not present

## 2019-07-11 DIAGNOSIS — Z9889 Other specified postprocedural states: Secondary | ICD-10-CM

## 2019-07-11 NOTE — Patient Instructions (Addendum)
Medication Instructions:   If you need a refill on your cardiac medications before your next appointment, please call your pharmacy.   Lab work: Your physician recommends that you have lab work today- TSH and Amiodarone toxicity level  If you have labs (blood work) drawn today and your tests are completely normal, you will receive your results only by: Marland Kitchen MyChart Message (if you have MyChart) OR . A paper copy in the mail If you have any lab test that is abnormal or we need to change your treatment, we will call you to review the results.  Testing/Procedures: Your physician has recommended that you have a pulmonary function test. Pulmonary Function Tests are a group of tests that measure how well air moves in and out of your lungs.  Follow-Up: At [facility], you and your health needs are our priority.  As part of our continuing mission to provide you with exceptional heart care, we have created designated Provider Care Teams.  These Care Teams include your primary Cardiologist (physician) and Advanced Practice Providers (APPs -  Physician Assistants and Nurse Practitioners) who all work together to provide you with the care you need, when you need it. You will need a follow up appointment in 6 months.  Please call our office 2 months in advance to schedule this appointment.  You may see Jenkins Rouge, MD or one of the following Advanced Practice Providers on your designated Care Team:   Truitt Merle, NP Cecilie Kicks, NP . Kathyrn Drown, NP

## 2019-07-13 LAB — AMIODARONE LEVEL
Amiodarone, Serum: 0.9 ug/mL — ABNORMAL LOW (ref 1.0–2.5)
Noramiodarone,S: 0.7 ug/mL — ABNORMAL LOW (ref 1.0–2.5)

## 2019-07-13 LAB — TSH: TSH: 0.867 u[IU]/mL (ref 0.450–4.500)

## 2019-07-16 ENCOUNTER — Telehealth: Payer: Self-pay | Admitting: Cardiovascular Disease

## 2019-07-16 NOTE — Telephone Encounter (Signed)
Called patient back with lab results.

## 2019-07-16 NOTE — Telephone Encounter (Signed)
New message ° ° °Patient is returning call for lab results. Please call. °

## 2019-07-22 NOTE — Progress Notes (Signed)
Virtual Visit via Video Note   This visit type was conducted due to national recommendations for restrictions regarding the COVID-19 Pandemic (e.g. social distancing) in an effort to limit this patient's exposure and mitigate transmission in our community.  Due to his co-morbid illnesses, this patient is at least at moderate risk for complications without adequate follow up.  This format is felt to be most appropriate for this patient at this time.  All issues noted in this document were discussed and addressed.  A limited physical exam was performed with this format.  Please refer to the patient's chart for his consent to telehealth for [facility].   Evaluation Performed:  Follow-up visit  This visit type was conducted due to national recommendations for restrictions regarding the COVID-19 Pandemic (e.g. social distancing).  This format is felt to be most appropriate for this patient at this time.  All issues noted in this document were discussed and addressed.  No physical exam was performed (except for noted visual exam findings with Video Visits).  Please refer to the patient's chart (MyChart message for video visits and phone note for telephone visits) for the patient's consent to telehealth for [facility].  Date:  07/23/2019   ID:  [name] Brown, DOB August 25, 1951, MRN [id number]  Patient Location:  Home  Provider location:   [place]  PCP:  Hoyt Koch, MD  Cardiologist:  Jenkins Rouge, MD  Sleep Medicine:  Leanora Cover, MD Electrophysiologist:  Will Meredith Leeds, MD   Chief Complaint:  OSA  History of Present Illness:    [name] Brown is a 68 y.o. male who presents via audio/video conferencing for a telehealth visit today for evaluation of OSA.  He has a hx of afib, SSS, CAD.  He has a hx of OSA with home sleep study documenting this in 2018.  He was placed on CPAP but at that time did not tolerate CPAP therapy.  He was using a nasal mask or nasal pillow  mask but it bothered him so much he could not use it.  He was referred to Dr. Ron Parker for an oral device but apparently needed implants put in because he does not have enough teeth but it was cost prohibitive. He says that he feels fatigued in the am.  He can only sleep 2 hours at a time and due to pinched nerve in back he is not sleeping well at night due to pain in his hips.  He says his wife notices him snoring at nigh and stops breathing but he does not wake up gasping for breath.  The patient does not have symptoms concerning for COVID-19 infection (fever, chills, cough, or new shortness of breath).   Prior CV studies:   The following studies were reviewed today:  none  Past Medical History:  Diagnosis Date  . Aortic aneurysm ([place]) Endograf 2009  . Arthritis   . Atrial fibrillation ([place])   . Bradycardia   . CAD (coronary artery disease) CABG 03/2000   Median sternotomy for coronary artery bypass grafting x 3 (left  . HTN (hypertension)   . Hx of CABG 2001   severe 3 vessel disease  . Hx of colonic polyp 06/2010   Hyperplastic   . Hyperlipidemia   . Myocardial infarction [facility])    Past Surgical History:  Procedure Laterality Date  . CARDIOVERSION N/A 11/28/2018   Procedure: CARDIOVERSION;  Surgeon: Elouise Munroe, MD;  Location[facility] ENDOSCOPY;  Service: Cardiovascular;  Laterality: N/A;  .  COLONOSCOPY    . CORONARY ARTERY BYPASS GRAFT     AAA stent graft on 12/01/2007 at [facility]  coronary bypass in 2001. Dr. Roxy Manns.  Marland Kitchen FOOT SURGERY Left 07/19/2017  . LEFT HEART CATHETERIZATION WITH CORONARY ANGIOGRAM N/A 10/26/2013   Procedure: LEFT HEART CATHETERIZATION WITH CORONARY ANGIOGRAM;  Surgeon: Peter M Martinique, MD;  Location: [facility] CATH LAB;  Service: Cardiovascular;  Laterality: N/A;     Current Meds  Medication Sig  . amiodarone (PACERONE) 200 MG tablet Take 1 tablet (200 mg total) TWICE a day for one month, then reduce and take 1 tablet (200 mg total) ONCE daily   . apixaban (ELIQUIS) 5 MG TABS tablet Take 1 tablet (5 mg total) by mouth 2 (two) times daily.  Marland Kitchen aspirin EC 81 MG EC tablet Take 1 tablet (81 mg total) by mouth daily.  . cholecalciferol (VITAMIN D) 25 MCG (1000 UT) tablet Take 1,000 Units by mouth daily.   . diclofenac sodium (VOLTAREN) 1 % GEL Apply 2 g topically 4 (four) times daily.  . divalproex (DEPAKOTE ER) 500 MG 24 hr tablet Take 1 tablet (500 mg total) by mouth daily. NEEDS ANNUAL VISIT FOR FURTHER REFILLS  . Doxycycline Hyclate 50 MG TABS Take 50 mg by mouth daily.   Marland Kitchen ezetimibe (ZETIA) 10 MG tablet Take 1 tablet (10 mg total) by mouth daily.  Marland Kitchen gabapentin (NEURONTIN) 300 MG capsule Take 1 capsule (300 mg total) by mouth 2 (two) times daily.  . Glucosamine-Chondroitin (GLUCOSAMINE CHONDR COMPLEX PO) Take 500 mg by mouth 2 (two) times daily.   . hydrALAZINE (APRESOLINE) 50 MG tablet TAKE 1 TABLET(50 MG) BY MOUTH TWICE DAILY  . isosorbide mononitrate (IMDUR) 30 MG 24 hr tablet TAKE 1 TABLET BY MOUTH EVERY DAY  . montelukast (SINGULAIR) 10 MG tablet Take 1 tablet (10 mg total) by mouth at bedtime.  . Multiple Vitamin (MULTIVITAMIN) capsule Take 1 capsule by mouth daily.  . niacin (NIASPAN) 1000 MG CR tablet Take 1,000 mg by mouth 2 (two) times daily.    . nitroGLYCERIN (NITROSTAT) 0.4 MG SL tablet DISSOLVE 1 TABLET UNDER THE TONGUE EVERY 5 MINUTES FOR 3 DOSES AS NEEDED FOR CHEST PAIN  . Omega-3 Fatty Acids (FISH OIL) 1200 MG CAPS Take 1,200 mg by mouth 2 (two) times daily.  . potassium chloride SA (K-DUR,KLOR-CON) 20 MEQ tablet TAKE 1 TABLET BY MOUTH EVERY DAY  . predniSONE (DELTASONE) 20 MG tablet Take 2 tablets (40 mg total) by mouth daily with breakfast.  . ramipril (ALTACE) 10 MG capsule TAKE 1 CAPSULE BY MOUTH TWICE DAILY  . tamsulosin (FLOMAX) 0.4 MG CAPS capsule TAKE 1 CAPSULE(0.4 MG) BY MOUTH DAILY  . tiZANidine (ZANAFLEX) 4 MG tablet Take 1 tablet (4 mg total) by mouth every 8 (eight) hours as needed for muscle spasms.  .  traZODone (DESYREL) 50 MG tablet TAKE 1/2 TO 1 TABLET(25 TO 50 MG) BY MOUTH AT BEDTIME AS NEEDED FOR SLEEP  . vitamin C (ASCORBIC ACID) 500 MG tablet Take 500 mg by mouth daily.     Allergies:   Statins   Social History   Tobacco Use  . Smoking status: Former Smoker    Packs/day: 2.00    Years: 30.00    Pack years: 60.00    Types: Cigarettes    Quit date: 01/05/1992    Years since quitting: 27.5  . Smokeless tobacco: Never Used  . Tobacco comment: quit 19 yrs ago  Substance Use Topics  .  Alcohol use: No  . Drug use: No     Family Hx: The patient's family history includes Heart attack in his father and mother; Heart disease in his father and mother. There is no history of Rectal cancer, Stomach cancer, Pancreatic cancer, Esophageal cancer, Colon polyps, or Colon cancer.  ROS:   Please see the history of present illness.     All other systems reviewed and are negative.   Labs/Other Tests and Data Reviewed:    Recent Labs: 11/28/2018: B Natriuretic Peptide 111.3 03/16/2019: ALT 16; BUN 32; Creatinine, Ser 1.36; Hemoglobin 12.8; Platelets 187.0; Potassium 5.0; Pro B Natriuretic peptide (BNP) 287.0; Sodium 138 07/11/2019: TSH 0.867   Recent Lipid Panel Lab Results  Component Value Date/Time   CHOL 114 11/29/2018 02:23 AM   TRIG 32 11/29/2018 02:23 AM   HDL 24 (L) 11/29/2018 02:23 AM   CHOLHDL 4.8 11/29/2018 02:23 AM   LDLCALC 84 11/29/2018 02:23 AM    Wt Readings from Last 3 Encounters:  07/23/19 210 lb (95.3 kg)  07/11/19 212 lb (96.2 kg)  12/06/18 198 lb (89.8 kg)     Objective:    Vital Signs:  BP 116/66   Pulse (!) 54   Ht 5\' 10"  (1.778 m)   Wt 210 lb (95.3 kg)   BMI 30.13 kg/m    CONSTITUTIONAL:  Well nourished, well developed male in no acute distress.  EYES: anicteric MOUTH: oral mucosa is pink RESPIRATORY: Normal respiratory effort, symmetric expansion CARDIOVASCULAR: No peripheral edema SKIN: No rash, lesions or ulcers MUSCULOSKELETAL: no digital  cyanosis NEURO: Cranial Nerves II-XII grossly intact, moves all extremities PSYCH: Intact judgement and insight.  A&O x 3, Mood/affect appropriate   ASSESSMENT & PLAN:    1.  OSA -he has had a sleep study in 2018 and was tried on CPAP but he did not tolerate the PAP. -the oral device is cost prohibitive due to needing major dental work prior to getting the device so that is not an option -he is now interested in trying CPAP again - I will check with his insurance as to whether he will need a new sleep -I will check with insurance if they will pay for PAP device without new sleep study  2.  HTN -BP controlled -continue Hydralazine 50mg  BID, Ramipril 10mg  BID and Imdur 30mg  daily.  3.  PAF -he thinks he is maintaining NSR -continue Amio 200mg  daily, apixaban 5mg  daily  COVID-19 Education: The signs and symptoms of COVID-19 were discussed with the patient and how to seek care for testing (follow up with PCP or arrange E-visit).  The importance of social distancing was discussed today.  Patient Risk:   After full review of this patient's clinical status, I feel that they are at least moderate risk at this time.  Time:   Today, I have spent 20 minutes directly with the patient on telemedicine discussing medical problems including OSA, HTN, obesity.  We also reviewed the symptoms of COVID 19 and the ways to protect against contracting the virus with telehealth technology.  I spent an additional 5 minutes reviewing patient's chart including PAP compliance download.  Medication Adjustments/Labs and Tests Ordered: Current medicines are reviewed at length with the patient today.  Concerns regarding medicines are outlined above.  Tests Ordered: No orders of the defined types were placed in this encounter.  Medication Changes: No orders of the defined types were placed in this encounter.   Disposition:  Follow up in 1 year(s)  Signed, Lylie Blacklock  Radford Pax, MD  07/23/2019 9:08 AM    [facility]

## 2019-07-23 ENCOUNTER — Other Ambulatory Visit: Payer: Self-pay

## 2019-07-23 ENCOUNTER — Telehealth (INDEPENDENT_AMBULATORY_CARE_PROVIDER_SITE_OTHER): Payer: PPO | Admitting: Cardiology

## 2019-07-23 ENCOUNTER — Encounter: Payer: Self-pay | Admitting: Family Medicine

## 2019-07-23 VITALS — BP 116/66 | HR 54 | Ht 70.0 in | Wt 210.0 lb

## 2019-07-23 DIAGNOSIS — I4819 Other persistent atrial fibrillation: Secondary | ICD-10-CM | POA: Diagnosis not present

## 2019-07-23 DIAGNOSIS — G4733 Obstructive sleep apnea (adult) (pediatric): Secondary | ICD-10-CM

## 2019-07-23 DIAGNOSIS — I10 Essential (primary) hypertension: Secondary | ICD-10-CM | POA: Diagnosis not present

## 2019-07-23 NOTE — Patient Instructions (Signed)
Medication Instructions:  Your physician recommends that you continue on your current medications as directed. Please refer to the Current Medication list given to you today.  If you need a refill on your cardiac medications before your next appointment, please call your pharmacy.   Lab work: none If you have labs (blood work) drawn today and your tests are completely normal, you will receive your results only by: Marland Kitchen MyChart Message (if you have MyChart) OR . A paper copy in the mail If you have any lab test that is abnormal or we need to change your treatment, we will call you to review the results.  Testing/Procedures: none  Follow-Up: Your physician recommends that you schedule a follow-up appointment as needed with Dr Radford Pax

## 2019-07-30 ENCOUNTER — Other Ambulatory Visit: Payer: Self-pay

## 2019-07-30 DIAGNOSIS — M545 Low back pain, unspecified: Secondary | ICD-10-CM

## 2019-07-30 DIAGNOSIS — G8929 Other chronic pain: Secondary | ICD-10-CM

## 2019-07-31 ENCOUNTER — Encounter: Payer: Self-pay | Admitting: Cardiology

## 2019-07-31 ENCOUNTER — Ambulatory Visit: Payer: PPO | Admitting: Cardiology

## 2019-07-31 ENCOUNTER — Other Ambulatory Visit: Payer: Self-pay

## 2019-07-31 VITALS — BP 124/56 | HR 46 | Ht 70.0 in | Wt 210.4 lb

## 2019-07-31 DIAGNOSIS — I4819 Other persistent atrial fibrillation: Secondary | ICD-10-CM | POA: Diagnosis not present

## 2019-07-31 NOTE — Progress Notes (Signed)
Electrophysiology Office Note   Date:  07/31/2019   ID:  [name] Brown, DOB 10-21-1950, MRN [id number]  PCP:  Hoyt Koch, MD  Cardiologist:  Johnsie Cancel Primary Electrophysiologist:  Will Meredith Leeds, MD    No chief complaint on file.    History of Present Illness: [name] Brown is a 68 y.o. male who is being seen today for the evaluation of atrial fibrillation at the request of Hoyt Koch, *. Presenting today for electrophysiology evaluation.  He has a history of coronary artery disease status post three-vessel CABG, hypertension, hyperlipidemia, and atrial fibrillation.  He was found to be in atrial fibrillation incidentally on office visit 08/30/2017.  He was put on Eliquis at the time.  Beta-blockers were not started due to resting bradycardia.  January 2020 he was noted increasing dyspnea on exertion.  He had a cardioversion 11/28/2016.    Today, denies symptoms of palpitations, chest pain, shortness of breath, orthopnea, PND, lower extremity edema, claudication, dizziness, presyncope, syncope, bleeding, or neurologic sequela. The patient is tolerating medications without difficulties.    Past Medical History:  Diagnosis Date  . Aortic aneurysm ([place]) Endograf 2009  . Arthritis   . Atrial fibrillation ([place])   . Bradycardia   . CAD (coronary artery disease) CABG 03/2000   Median sternotomy for coronary artery bypass grafting x 3 (left  . HTN (hypertension)   . Hx of CABG 2001   severe 3 vessel disease  . Hx of colonic polyp 06/2010   Hyperplastic   . Hyperlipidemia   . Myocardial infarction [facility])    Past Surgical History:  Procedure Laterality Date  . CARDIOVERSION N/A 11/28/2018   Procedure: CARDIOVERSION;  Surgeon: Elouise Munroe, MD;  Location: [facility] ENDOSCOPY;  Service: Cardiovascular;  Laterality: N/A;  . COLONOSCOPY    . CORONARY ARTERY BYPASS GRAFT     AAA stent graft on 12/01/2007 at [facility]  coronary bypass in 2001.  Dr. Roxy Manns.  Marland Kitchen FOOT SURGERY Left 07/19/2017  . LEFT HEART CATHETERIZATION WITH CORONARY ANGIOGRAM N/A 10/26/2013   Procedure: LEFT HEART CATHETERIZATION WITH CORONARY ANGIOGRAM;  Surgeon: Peter M Martinique, MD;  Location: [facility] CATH LAB;  Service: Cardiovascular;  Laterality: N/A;     Current Outpatient Medications  Medication Sig Dispense Refill  . amiodarone (PACERONE) 200 MG tablet Take 1 tablet (200 mg total) TWICE a day for one month, then reduce and take 1 tablet (200 mg total) ONCE daily 60 tablet 6  . apixaban (ELIQUIS) 5 MG TABS tablet Take 1 tablet (5 mg total) by mouth 2 (two) times daily. 180 tablet 3  . aspirin EC 81 MG EC tablet Take 1 tablet (81 mg total) by mouth daily.    . cholecalciferol (VITAMIN D) 25 MCG (1000 UT) tablet Take 1,000 Units by mouth daily.     . diclofenac sodium (VOLTAREN) 1 % GEL Apply 2 g topically 4 (four) times daily. 100 g 3  . divalproex (DEPAKOTE ER) 500 MG 24 hr tablet Take 1 tablet (500 mg total) by mouth daily. NEEDS ANNUAL VISIT FOR FURTHER REFILLS 90 tablet 3  . Doxycycline Hyclate 50 MG TABS Take 50 mg by mouth daily.     Marland Kitchen ezetimibe (ZETIA) 10 MG tablet Take 1 tablet (10 mg total) by mouth daily. 90 tablet 3  . gabapentin (NEURONTIN) 300 MG capsule Take 1 capsule (300 mg total) by mouth 2 (two) times daily. 60 capsule 3  . Glucosamine-Chondroitin (GLUCOSAMINE CHONDR COMPLEX PO) Take  500 mg by mouth 2 (two) times daily.     . hydrALAZINE (APRESOLINE) 50 MG tablet TAKE 1 TABLET(50 MG) BY MOUTH TWICE DAILY 180 tablet 3  . isosorbide mononitrate (IMDUR) 30 MG 24 hr tablet TAKE 1 TABLET BY MOUTH EVERY DAY 90 tablet 2  . montelukast (SINGULAIR) 10 MG tablet Take 1 tablet (10 mg total) by mouth at bedtime. 30 tablet 3  . Multiple Vitamin (MULTIVITAMIN) capsule Take 1 capsule by mouth daily.    . niacin (NIASPAN) 1000 MG CR tablet Take 1,000 mg by mouth 2 (two) times daily.      . nitroGLYCERIN (NITROSTAT) 0.4 MG SL tablet DISSOLVE 1 TABLET UNDER THE TONGUE  EVERY 5 MINUTES FOR 3 DOSES AS NEEDED FOR CHEST PAIN 25 tablet 6  . Omega-3 Fatty Acids (FISH OIL) 1200 MG CAPS Take 1,200 mg by mouth 2 (two) times daily.    . potassium chloride SA (K-DUR,KLOR-CON) 20 MEQ tablet TAKE 1 TABLET BY MOUTH EVERY DAY 90 tablet 3  . predniSONE (DELTASONE) 20 MG tablet Take 2 tablets (40 mg total) by mouth daily with breakfast. 10 tablet 0  . ramipril (ALTACE) 10 MG capsule TAKE 1 CAPSULE BY MOUTH TWICE DAILY 180 capsule 3  . tamsulosin (FLOMAX) 0.4 MG CAPS capsule TAKE 1 CAPSULE(0.4 MG) BY MOUTH DAILY 90 capsule 1  . tiZANidine (ZANAFLEX) 4 MG tablet Take 1 tablet (4 mg total) by mouth every 8 (eight) hours as needed for muscle spasms. 270 tablet 3  . traZODone (DESYREL) 50 MG tablet TAKE 1/2 TO 1 TABLET(25 TO 50 MG) BY MOUTH AT BEDTIME AS NEEDED FOR SLEEP 90 tablet 1  . vitamin C (ASCORBIC ACID) 500 MG tablet Take 500 mg by mouth daily.     No current facility-administered medications for this visit.     Allergies:   Statins   Social History:  The patient  reports that he quit smoking about 27 years ago. His smoking use included cigarettes. He has a 60.00 pack-year smoking history. He has never used smokeless tobacco. He reports that he does not drink alcohol or use drugs.   Family History:  The patient's family history includes Heart attack in his father and mother; Heart disease in his father and mother.    ROS:  Please see the history of present illness.   Otherwise, review of systems is positive for none.   All other systems are reviewed and negative.   PHYSICAL EXAM: VS:  BP (!) 124/56   Pulse (!) 46   Ht 5\' 10"  (1.778 m)   Wt 210 lb 6.4 oz (95.4 kg)   SpO2 98%   BMI 30.19 kg/m  , BMI Body mass index is 30.19 kg/m. GEN: Well nourished, well developed, in no acute distress  HEENT: normal  Neck: no JVD, carotid bruits, or masses Cardiac: RRR; no murmurs, rubs, or gallops,no edema  Respiratory:  clear to auscultation bilaterally, normal work of  breathing GI: soft, nontender, nondistended, + BS MS: no deformity or atrophy  Skin: warm and dry Neuro:  Strength and sensation are intact Psych: euthymic mood, full affect  EKG:  EKG is ordered today. Personal review of the ekg ordered shows sinus rhythm, rate 46, first-degree AV block  Recent Labs: 11/28/2018: B Natriuretic Peptide 111.3 03/16/2019: ALT 16; BUN 32; Creatinine, Ser 1.36; Hemoglobin 12.8; Platelets 187.0; Potassium 5.0; Pro B Natriuretic peptide (BNP) 287.0; Sodium 138 07/11/2019: TSH 0.867    Lipid Panel     Component Value Date/Time  CHOL 114 11/29/2018 0223   TRIG 32 11/29/2018 0223   HDL 24 (L) 11/29/2018 0223   CHOLHDL 4.8 11/29/2018 0223   VLDL 6 11/29/2018 0223   LDLCALC 84 11/29/2018 0223     Wt Readings from Last 3 Encounters:  07/31/19 210 lb 6.4 oz (95.4 kg)  07/23/19 210 lb (95.3 kg)  07/11/19 212 lb (96.2 kg)      Other studies Reviewed: Additional studies/ records that were reviewed today include: TTE 11/08/18  Review of the above records today demonstrates:   1. The left ventricle appears to be normal in size, have mild wall thickness, with normal systolic function of 0000000. Echo evidence of indeterminate in diastolic filling patterns.  2. No regional wall motion abnormalities seen.  3. Right ventricular systolic pressure is is normal.  4. The right ventricle is mildly enlarged in size, has normal wall thickness and normal systolic function.  5. Severely dilated left atrial size.  6. Moderately dilated right atrial size.  7. Mitral valve regurgitation is trivial by color flow Doppler.  8. The mitral valve normal in structure and function.  9. Normal tricuspid valve. 10. Tricuspid regurgitation is mild. 11. Aortic valve regurgitation is mild by color flow Doppler. 12. Aortic valve tricuspid. 13. Mild dilatation of the ascending aorta. 14. No atrial level shunt detected by color flow Doppler.  Holter 11/14/18 - personally reviewed Afib  Rates 22-110 Average HR 54 bpm Longest pause 3.6 seconds Patient being referred to EP For PAF with SSS  SPECT 11/08/18  The left ventricular ejection fraction is normal (55-65%).  Nuclear stress EF: 56%. Post CABG septal wall hypokinesis.  There was no ST segment deviation noted during stress.  There is mildly reduced radiotracer uptake at both rest and stress in the anterior, lateral and inferolateral distributions. No ischemia identified. (POST CABG)  This is a low risk study.  ASSESSMENT AND PLAN:  1.  Persistent atrial fibrillation: Currently on Eliquis and amiodarone.  Left atrium is severely enlarged.  He would potentially be a candidate for ablation, but would prefer to continue amiodarone and see if we can get some more positive remodeling which would improve the success rate of ablation.  I will see him back in 6 months with an echo at that time to see if his left atrium has remodeled.  This patients CHA2DS2-VASc Score and unadjusted Ischemic Stroke Rate (% per year) is equal to 3.2 % stroke rate/year from a score of 3  Above score calculated as 1 point each if present [CHF, HTN, DM, Vascular=MI/PAD/Aortic Plaque, Age if 65-74, or Male] Above score calculated as 2 points each if present [Age > 75, or Stroke/TIA/TE]   2.  Coronary artery disease: Status post CABG with occluded free radial to the OM.  Normal ejection fraction.  No current chest pain.    3.  Hypertension: Currently well controlled  4.  Hyperlipidemia: Statin per primary cardiology.   Current medicines are reviewed at length with the patient today.   The patient does not have concerns regarding his medicines.  The following changes were made today: none  Labs/ tests ordered today include:  Orders Placed This Encounter  Procedures  . EKG 12-Lead    Disposition:   FU with Will Camnitz 6 months  Signed, Will Meredith Leeds, MD  07/31/2019 9:49 AM     [facility] [address]  [place] [address] [phone] (office) [phone] (fax)

## 2019-07-31 NOTE — Patient Instructions (Signed)
Medication Instructions:  Your physician recommends that you continue on your current medications as directed. Please refer to the Current Medication list given to you today.  * If you need a refill on your cardiac medications before your next appointment, please call your pharmacy.   Labwork: None ordered If you have labs (blood work) drawn today and your tests are completely normal, you will receive your results only by:  [place] (if you have MyChart) OR  A paper copy in the mail If you have any lab test that is abnormal or we need to change your treatment, we will call you to review the results.  Testing/Procedures: Your physician has requested that you have an echocardiogram in 6 months (before your 6 month follow up with Dr. Curt Bears). Echocardiography is a painless test that uses sound waves to create images of your heart. It provides your doctor with information about the size and shape of your heart and how well your heart's chambers and valves are working. This procedure takes approximately one hour. There are no restrictions for this procedure.  Follow-Up: At [facility], you and your health needs are our priority.  As part of our continuing mission to provide you with exceptional heart care, we have created designated Provider Care Teams.  These Care Teams include your primary Cardiologist (physician) and Advanced Practice Providers (APPs -  Physician Assistants and Nurse Practitioners) who all work together to provide you with the care you need, when you need it.  You will need a follow up appointment in 6 months (after your echocardiogram is completed).  Please call our office 2 months in advance to schedule this appointment.  You may see Dr Curt Bears or one of the following Advanced Practice Providers on your designated Care Team:    Chanetta Marshall, NP  Tommye Standard, PA-C  Oda Kilts, [place]  Thank you for choosing [facility]!!   Trinidad Curet, RN [phone]

## 2019-07-31 NOTE — Addendum Note (Signed)
Addended by: Stanton Kidney on: 07/31/2019 11:53 AM   Modules accepted: Orders

## 2019-08-01 ENCOUNTER — Telehealth: Payer: Self-pay | Admitting: *Deleted

## 2019-08-01 NOTE — Telephone Encounter (Signed)
-----   Message from Sueanne Margarita, MD sent at 07/31/2019  5:40 PM EDT ----- Please set up for in lab CPAP titration due to severity of OSA on HST

## 2019-08-01 NOTE — Telephone Encounter (Signed)
Sent to sleep pool for precert

## 2019-08-02 ENCOUNTER — Telehealth: Payer: Self-pay | Admitting: *Deleted

## 2019-08-02 NOTE — Telephone Encounter (Signed)
Staff message sent to Tomi Bamberger received for sleep study. Auth # [id number]. Valid dates 08/02/19 to 10/31/19.

## 2019-08-06 ENCOUNTER — Other Ambulatory Visit: Payer: Self-pay

## 2019-08-06 ENCOUNTER — Encounter (HOSPITAL_BASED_OUTPATIENT_CLINIC_OR_DEPARTMENT_OTHER): Payer: Self-pay | Admitting: *Deleted

## 2019-08-07 ENCOUNTER — Other Ambulatory Visit (HOSPITAL_COMMUNITY)
Admission: RE | Admit: 2019-08-07 | Discharge: 2019-08-07 | Disposition: A | Discharge status: Home / Self Care | Payer: PPO | Source: Ambulatory Visit | Attending: General Surgery | Admitting: General Surgery

## 2019-08-07 ENCOUNTER — Telehealth: Payer: Self-pay | Admitting: *Deleted

## 2019-08-07 ENCOUNTER — Ambulatory Visit
Admission: RE | Admit: 2019-08-07 | Discharge: 2019-08-07 | Disposition: A | Discharge status: Home / Self Care | Payer: PPO | Source: Ambulatory Visit | Attending: Family Medicine | Admitting: Family Medicine

## 2019-08-07 DIAGNOSIS — G8929 Other chronic pain: Secondary | ICD-10-CM

## 2019-08-07 DIAGNOSIS — Z01812 Encounter for preprocedural laboratory examination: Secondary | ICD-10-CM | POA: Diagnosis not present

## 2019-08-07 DIAGNOSIS — M545 Low back pain, unspecified: Secondary | ICD-10-CM

## 2019-08-07 DIAGNOSIS — Z20828 Contact with and (suspected) exposure to other viral communicable diseases: Secondary | ICD-10-CM | POA: Diagnosis not present

## 2019-08-07 DIAGNOSIS — G4733 Obstructive sleep apnea (adult) (pediatric): Secondary | ICD-10-CM

## 2019-08-07 MED ORDER — METHYLPREDNISOLONE ACETATE 40 MG/ML INJ SUSP (RADIOLOG
120.0000 mg | Freq: Once | INTRAMUSCULAR | Status: AC
  Administered 2019-08-07: 10:00:00 120 mg via EPIDURAL

## 2019-08-07 MED ORDER — IOPAMIDOL (ISOVUE-M 200) INJECTION 41%
1.0000 mL | Freq: Once | INTRAMUSCULAR | Status: AC
  Administered 2019-08-07: 1 mL via EPIDURAL

## 2019-08-07 NOTE — Progress Notes (Signed)

## 2019-08-07 NOTE — Telephone Encounter (Signed)
-----   Message from Lauralee Evener, [place] sent at 08/02/2019  7:03 PM EDT ----- Regarding: RE: precert Received auth for CPAP titration. Auth # [id number]. Valid dates. 08/02/19 to 10/31/19. ----- Message ----- From: [name], CMA Sent: 08/01/2019   6:05 PM EDT To: [facility] Sleep Studies Subject: precert                                        CPAP titration due to severity of OSA

## 2019-08-07 NOTE — Discharge Instructions (Signed)

## 2019-08-08 ENCOUNTER — Telehealth: Payer: Self-pay | Admitting: *Deleted

## 2019-08-08 LAB — NOVEL CORONAVIRUS, NAA (HOSP ORDER, SEND-OUT TO REF LAB; TAT 18-24 HRS): SARS-CoV-2, NAA: NOT DETECTED

## 2019-08-08 NOTE — Telephone Encounter (Signed)
Patient is scheduled for CPAP Titration on 08/20/19. [name] Brown is scheduled for COVID screening on 08/17/19 2:45  prior to titration.   Patient understands his titration study will be done at [facility] sleep lab. Patient understands he will receive a letter in a week or so detailing appointment, date, time, and location. Patient understands to call if he does not receive the letter  in a timely manner. Patient agrees with treatment and thanked me for call.

## 2019-08-08 NOTE — Telephone Encounter (Signed)
-----   Message from Lauralee Evener, [place] sent at 08/02/2019  7:03 PM EDT ----- Regarding: RE: precert Received auth for CPAP titration. Auth # [id number]. Valid dates. 08/02/19 to 10/31/19. ----- Message ----- From: [name], CMA Sent: 08/01/2019   6:05 PM EDT To: [facility] Subject: precert                                        CPAP titration due to severity of OSA

## 2019-08-10 ENCOUNTER — Ambulatory Visit (HOSPITAL_BASED_OUTPATIENT_CLINIC_OR_DEPARTMENT_OTHER): Payer: PPO | Admitting: Certified Registered"

## 2019-08-10 ENCOUNTER — Encounter (HOSPITAL_BASED_OUTPATIENT_CLINIC_OR_DEPARTMENT_OTHER)
Admission: RE | Disposition: A | Discharge status: Home / Self Care | Payer: Self-pay | Source: Home / Self Care | Attending: General Surgery

## 2019-08-10 ENCOUNTER — Encounter (HOSPITAL_BASED_OUTPATIENT_CLINIC_OR_DEPARTMENT_OTHER): Payer: Self-pay | Admitting: Certified Registered"

## 2019-08-10 ENCOUNTER — Other Ambulatory Visit: Payer: Self-pay

## 2019-08-10 ENCOUNTER — Ambulatory Visit (HOSPITAL_BASED_OUTPATIENT_CLINIC_OR_DEPARTMENT_OTHER)
Admission: RE | Admit: 2019-08-10 | Discharge: 2019-08-10 | Disposition: A | Discharge status: Home / Self Care | Payer: PPO | Attending: General Surgery | Admitting: General Surgery

## 2019-08-10 DIAGNOSIS — I2510 Atherosclerotic heart disease of native coronary artery without angina pectoris: Secondary | ICD-10-CM | POA: Diagnosis not present

## 2019-08-10 DIAGNOSIS — E7800 Pure hypercholesterolemia, unspecified: Secondary | ICD-10-CM | POA: Diagnosis not present

## 2019-08-10 DIAGNOSIS — Z79899 Other long term (current) drug therapy: Secondary | ICD-10-CM | POA: Insufficient documentation

## 2019-08-10 DIAGNOSIS — Z888 Allergy status to other drugs, medicaments and biological substances status: Secondary | ICD-10-CM | POA: Insufficient documentation

## 2019-08-10 DIAGNOSIS — I10 Essential (primary) hypertension: Secondary | ICD-10-CM | POA: Insufficient documentation

## 2019-08-10 DIAGNOSIS — I4891 Unspecified atrial fibrillation: Secondary | ICD-10-CM | POA: Diagnosis not present

## 2019-08-10 DIAGNOSIS — Z7901 Long term (current) use of anticoagulants: Secondary | ICD-10-CM | POA: Insufficient documentation

## 2019-08-10 DIAGNOSIS — Z951 Presence of aortocoronary bypass graft: Secondary | ICD-10-CM | POA: Insufficient documentation

## 2019-08-10 DIAGNOSIS — Z8249 Family history of ischemic heart disease and other diseases of the circulatory system: Secondary | ICD-10-CM | POA: Diagnosis not present

## 2019-08-10 DIAGNOSIS — Z7982 Long term (current) use of aspirin: Secondary | ICD-10-CM | POA: Insufficient documentation

## 2019-08-10 DIAGNOSIS — D234 Other benign neoplasm of skin of scalp and neck: Secondary | ICD-10-CM | POA: Diagnosis not present

## 2019-08-10 DIAGNOSIS — I739 Peripheral vascular disease, unspecified: Secondary | ICD-10-CM | POA: Insufficient documentation

## 2019-08-10 DIAGNOSIS — I252 Old myocardial infarction: Secondary | ICD-10-CM | POA: Diagnosis not present

## 2019-08-10 DIAGNOSIS — E785 Hyperlipidemia, unspecified: Secondary | ICD-10-CM | POA: Diagnosis not present

## 2019-08-10 DIAGNOSIS — L729 Follicular cyst of the skin and subcutaneous tissue, unspecified: Secondary | ICD-10-CM | POA: Diagnosis not present

## 2019-08-10 DIAGNOSIS — Z87891 Personal history of nicotine dependence: Secondary | ICD-10-CM | POA: Insufficient documentation

## 2019-08-10 HISTORY — PX: MASS EXCISION: SHX2000

## 2019-08-10 HISTORY — DX: Sleep apnea, unspecified: G47.30

## 2019-08-10 SURGERY — EXCISION MASS
Anesthesia: General | Site: Scalp | Laterality: Right

## 2019-08-10 MED ORDER — HYDROCODONE-ACETAMINOPHEN 5-325 MG PO TABS: 1.0000 | ORAL_TABLET | Freq: Four times a day (QID) | ORAL | 0 refills | Status: DC | PRN

## 2019-08-10 MED ORDER — GABAPENTIN 300 MG PO CAPS
ORAL_CAPSULE | ORAL | Status: AC
  Filled 2019-08-10: qty 1

## 2019-08-10 MED ORDER — CHLORHEXIDINE GLUCONATE CLOTH 2 % EX PADS: 6.0000 | MEDICATED_PAD | Freq: Once | CUTANEOUS | Status: DC

## 2019-08-10 MED ORDER — LIDOCAINE-EPINEPHRINE 1 %-1:100000 IJ SOLN
INTRAMUSCULAR | Status: DC | PRN
  Administered 2019-08-10: 6 mL

## 2019-08-10 MED ORDER — LACTATED RINGERS IV SOLN
INTRAVENOUS | Status: DC
  Administered 2019-08-10: 08:00:00 via INTRAVENOUS

## 2019-08-10 MED ORDER — ONDANSETRON HCL 4 MG/2ML IJ SOLN
INTRAMUSCULAR | Status: DC | PRN
  Administered 2019-08-10: 4 mg via INTRAVENOUS

## 2019-08-10 MED ORDER — DEXAMETHASONE SODIUM PHOSPHATE 4 MG/ML IJ SOLN
INTRAMUSCULAR | Status: DC | PRN
  Administered 2019-08-10: 55 mg via INTRAVENOUS

## 2019-08-10 MED ORDER — EPINEPHRINE PF 1 MG/ML IJ SOLN
INTRAMUSCULAR | Status: AC
  Filled 2019-08-10: qty 1

## 2019-08-10 MED ORDER — LIDOCAINE HCL (CARDIAC) PF 100 MG/5ML IV SOSY
PREFILLED_SYRINGE | INTRAVENOUS | Status: DC | PRN
  Administered 2019-08-10: 60 mg via INTRAVENOUS

## 2019-08-10 MED ORDER — ACETAMINOPHEN 500 MG PO TABS
1000.0000 mg | ORAL_TABLET | ORAL | Status: AC
  Administered 2019-08-10: 1000 mg via ORAL

## 2019-08-10 MED ORDER — PROMETHAZINE HCL 25 MG/ML IJ SOLN: 6.2500 mg | INTRAMUSCULAR | Status: DC | PRN

## 2019-08-10 MED ORDER — FENTANYL CITRATE (PF) 100 MCG/2ML IJ SOLN
INTRAMUSCULAR | Status: AC
  Filled 2019-08-10: qty 2

## 2019-08-10 MED ORDER — OXYCODONE HCL 5 MG/5ML PO SOLN: 5.0000 mg | Freq: Once | ORAL | Status: DC | PRN

## 2019-08-10 MED ORDER — OXYCODONE HCL 5 MG PO TABS: 5.0000 mg | ORAL_TABLET | Freq: Once | ORAL | Status: DC | PRN

## 2019-08-10 MED ORDER — BUPIVACAINE HCL (PF) 0.25 % IJ SOLN
INTRAMUSCULAR | Status: AC
  Filled 2019-08-10: qty 30

## 2019-08-10 MED ORDER — GLYCOPYRROLATE 0.2 MG/ML IJ SOLN
INTRAMUSCULAR | Status: DC | PRN
  Administered 2019-08-10: 0.6 mg via INTRAVENOUS

## 2019-08-10 MED ORDER — CEFAZOLIN SODIUM-DEXTROSE 2-4 GM/100ML-% IV SOLN
INTRAVENOUS | Status: AC
  Filled 2019-08-10: qty 100

## 2019-08-10 MED ORDER — CEFAZOLIN SODIUM-DEXTROSE 2-4 GM/100ML-% IV SOLN
2.0000 g | INTRAVENOUS | Status: AC
  Administered 2019-08-10: 08:00:00 2 g via INTRAVENOUS

## 2019-08-10 MED ORDER — FENTANYL CITRATE (PF) 100 MCG/2ML IJ SOLN
INTRAMUSCULAR | Status: DC | PRN
  Administered 2019-08-10: 100 ug via INTRAVENOUS

## 2019-08-10 MED ORDER — FENTANYL CITRATE (PF) 100 MCG/2ML IJ SOLN: 25.0000 ug | INTRAMUSCULAR | Status: DC | PRN

## 2019-08-10 MED ORDER — LIDOCAINE-EPINEPHRINE 1 %-1:100000 IJ SOLN
INTRAMUSCULAR | Status: AC
  Filled 2019-08-10: qty 1

## 2019-08-10 MED ORDER — EPHEDRINE SULFATE 50 MG/ML IJ SOLN
INTRAMUSCULAR | Status: DC | PRN
  Administered 2019-08-10 (×2): 10 mg via INTRAVENOUS

## 2019-08-10 MED ORDER — ACETAMINOPHEN 500 MG PO TABS
ORAL_TABLET | ORAL | Status: AC
  Filled 2019-08-10: qty 2

## 2019-08-10 MED ORDER — GABAPENTIN 300 MG PO CAPS: 300.0000 mg | ORAL_CAPSULE | ORAL | Status: DC

## 2019-08-10 MED ORDER — CELECOXIB 200 MG PO CAPS
ORAL_CAPSULE | ORAL | Status: AC
  Filled 2019-08-10: qty 1

## 2019-08-10 MED ORDER — PROPOFOL 10 MG/ML IV BOLUS
INTRAVENOUS | Status: DC | PRN
  Administered 2019-08-10: 150 mg via INTRAVENOUS

## 2019-08-10 MED ORDER — CELECOXIB 200 MG PO CAPS
200.0000 mg | ORAL_CAPSULE | ORAL | Status: AC
  Administered 2019-08-10: 200 mg via ORAL

## 2019-08-10 SURGICAL SUPPLY — 53 items
BLADE SURG 10 STRL SS (BLADE) ×1 IMPLANT
BLADE SURG 15 STRL LF DISP TIS (BLADE) ×1 IMPLANT
BLADE SURG 15 STRL SS (BLADE) ×2
CANISTER SUCT 1200ML W/VALVE (MISCELLANEOUS) ×2 IMPLANT
CHLORAPREP W/TINT 26 (MISCELLANEOUS) ×3 IMPLANT
COVER BACK TABLE REUSABLE LG (DRAPES) ×3 IMPLANT
COVER MAYO STAND REUSABLE (DRAPES) ×3 IMPLANT
COVER WAND RF STERILE (DRAPES) IMPLANT
DECANTER SPIKE VIAL GLASS SM (MISCELLANEOUS) ×2 IMPLANT
DERMABOND ADVANCED (GAUZE/BANDAGES/DRESSINGS) ×2
DERMABOND ADVANCED .7 DNX12 (GAUZE/BANDAGES/DRESSINGS) ×1 IMPLANT
DRAPE LAPAROTOMY 100X72 PEDS (DRAPES) ×3 IMPLANT
DRAPE UTILITY XL STRL (DRAPES) ×3 IMPLANT
ELECT COATED BLADE 2.86 ST (ELECTRODE) ×3 IMPLANT
ELECT REM PT RETURN 9FT ADLT (ELECTROSURGICAL) ×3
ELECTRODE REM PT RTRN 9FT ADLT (ELECTROSURGICAL) ×1 IMPLANT
GAUZE 4X4 16PLY RFD (DISPOSABLE) IMPLANT
GLOVE BIO SURGEON STRL SZ 6.5 (GLOVE) ×1 IMPLANT
GLOVE BIO SURGEON STRL SZ7.5 (GLOVE) ×3 IMPLANT
GLOVE BIO SURGEONS STRL SZ 6.5 (GLOVE) ×1
GLOVE BIOGEL PI IND STRL 6.5 (GLOVE) IMPLANT
GLOVE BIOGEL PI IND STRL 7.0 (GLOVE) IMPLANT
GLOVE BIOGEL PI INDICATOR 6.5 (GLOVE) ×2
GLOVE BIOGEL PI INDICATOR 7.0 (GLOVE) ×4
GLOVE ECLIPSE 6.5 STRL STRAW (GLOVE) ×2 IMPLANT
GOWN STRL REUS W/ TWL LRG LVL3 (GOWN DISPOSABLE) ×2 IMPLANT
GOWN STRL REUS W/TWL LRG LVL3 (GOWN DISPOSABLE) ×6
NDL HYPO 25X1 1.5 SAFETY (NEEDLE) IMPLANT
NEEDLE HYPO 25X1 1.5 SAFETY (NEEDLE) ×3 IMPLANT
NS IRRIG 1000ML POUR BTL (IV SOLUTION) ×3 IMPLANT
PACK BASIN DAY SURGERY FS (CUSTOM PROCEDURE TRAY) ×3 IMPLANT
PENCIL BUTTON HOLSTER BLD 10FT (ELECTRODE) ×3 IMPLANT
SLEEVE SCD COMPRESS KNEE MED (MISCELLANEOUS) ×3 IMPLANT
SPONGE LAP 18X18 RF (DISPOSABLE) ×3 IMPLANT
SUT CHROMIC 3 0 SH 27 (SUTURE) IMPLANT
SUT ETHILON 3 0 PS 1 (SUTURE) IMPLANT
SUT MON AB 4-0 PC3 18 (SUTURE) ×2 IMPLANT
SUT PROLENE 3 0 PS 2 (SUTURE) IMPLANT
SUT SILK 2 0 PERMA HAND 18 BK (SUTURE) IMPLANT
SUT VIC AB 3-0 54X BRD REEL (SUTURE) IMPLANT
SUT VIC AB 3-0 BRD 54 (SUTURE)
SUT VIC AB 3-0 FS2 27 (SUTURE) IMPLANT
SUT VIC AB 3-0 SH 27 (SUTURE)
SUT VIC AB 3-0 SH 27X BRD (SUTURE) IMPLANT
SUT VIC AB 4-0 RB1 27 (SUTURE)
SUT VIC AB 4-0 RB1 27X BRD (SUTURE) IMPLANT
SUT VICRYL 4-0 PS2 18IN ABS (SUTURE) IMPLANT
SUT VICRYL AB 3 0 TIES (SUTURE) IMPLANT
SYR CONTROL 10ML LL (SYRINGE) ×2 IMPLANT
TOWEL GREEN STERILE FF (TOWEL DISPOSABLE) ×6 IMPLANT
TUBE CONNECTING 20'X1/4 (TUBING) ×1
TUBE CONNECTING 20X1/4 (TUBING) ×1 IMPLANT
YANKAUER SUCT BULB TIP NO VENT (SUCTIONS) ×2 IMPLANT

## 2019-08-10 NOTE — H&P (Signed)
[name] Brown  Location: [facility] Patient #: [id number] DOB: 1951/01/03 Undefined / Language: Cleophus Molt / Race: White Male   History of Present Illness  The patient is a 68 year old male who presents with a complaint of Mass. We are asked to see the patient in consultation by Dr. Pricilla Holm to evaluate him for a cyst on the scalp. The patient is a 69 year old white male who has had a small mass on his right temporal scalp for a number of years. This mass seems to cause him some discomfort and makes it difficult for him to brush his hair. He denies any drainage from the area. He denies any fevers or chills. He is on Eliquis for atrial fibrillation and is scheduled to have a cardiac ablation by Dr. Johnsie Cancel next week   Past Surgical History  Bypass Surgery for Poor Blood Flow to Legs  Colon Polyp Removal - Colonoscopy  Coronary Artery Bypass Graft  Foot Surgery  Left. Oral Surgery   Diagnostic Studies History  Colonoscopy  within last year  Allergies  Atorvastatin Calcium *ANTIHYPERLIPIDEMICS*   Medication History  Apixaban (5MG  Tablet, Oral) Active. Aspirin (81MG  Tablet, Oral) Active. Divalproex Sodium (500MG  Tablet DR, Oral) Active. Doxycycline Hyclate (50MG  Capsule, Oral) Active. Zetia (10MG  Tablet, Oral) Active. Lasix (20MG  Tablet, Oral) Active. Gabapentin (100MG  Capsule, Oral) Active. Glucosamine (500MG  Tablet, Oral) Active. hydrALAZINE HCl (25MG  Tablet, Oral) Active. Isosorbide Dinitrate (40MG  Capsule, Oral) Active. Niacin (1000MG  Tablet ER, Oral) Active. Nitroglycerin (0.4MG  Tab Sublingual, Sublingual) Active. Potassium Chloride (20MEQ Tablet ER, Oral) Active. Ramipril (10MG  Capsule, Oral) Active. Tamsulosin HCl (0.4MG  Capsule, Oral) Active. tiZANidine HCl (4MG  Capsule, Oral) Active. traZODone HCl (50MG  Tablet, Oral) Active. Vitamin C (250MG  Tablet, Oral) Active.  Social History Alcohol use  Remotely quit alcohol  use. Caffeine use  Coffee. No drug use  Tobacco use  Former smoker.  Family History  Alcohol Abuse  Brother, Father, Mother, Sister. Arthritis  Mother. Heart Disease  Father, Mother. Heart disease in male family member before age 62  Hypertension  Father.  Other Problems  Atrial Fibrillation  Chest pain  High blood pressure  Hypercholesterolemia  Myocardial infarction     Review of Systems  General Not Present- Appetite Loss, Chills, Fatigue, Fever, Night Sweats, Weight Gain and Weight Loss. Skin Not Present- Change in Wart/Mole, Dryness, Hives, Jaundice, New Lesions, Non-Healing Wounds, Rash and Ulcer. HEENT Present- Seasonal Allergies and Wears glasses/contact lenses. Not Present- Earache, Hearing Loss, Hoarseness, Nose Bleed, Oral Ulcers, Ringing in the Ears, Sinus Pain, Sore Throat, Visual Disturbances and Yellow Eyes. Respiratory Not Present- Bloody sputum, Chronic Cough, Difficulty Breathing, Snoring and Wheezing. Breast Not Present- Breast Mass, Breast Pain, Nipple Discharge and Skin Changes. Cardiovascular Present- Shortness of Breath. Not Present- Chest Pain, Difficulty Breathing Lying Down, Leg Cramps, Palpitations, Rapid Heart Rate and Swelling of Extremities. Gastrointestinal Not Present- Abdominal Pain, Bloating, Bloody Stool, Change in Bowel Habits, Chronic diarrhea, Constipation, Difficulty Swallowing, Excessive gas, Gets full quickly at meals, Hemorrhoids, Indigestion, Nausea, Rectal Pain and Vomiting. Male Genitourinary Present- Frequency. Not Present- Blood in Urine, Change in Urinary Stream, Impotence, Nocturia, Painful Urination, Urgency and Urine Leakage. Musculoskeletal Present- Joint Pain. Not Present- Back Pain, Joint Stiffness, Muscle Pain, Muscle Weakness and Swelling of Extremities. Neurological Not Present- Decreased Memory, Fainting, Headaches, Numbness, Seizures, Tingling, Tremor, Trouble walking and Weakness. Psychiatric Not Present-  Anxiety, Bipolar, Change in Sleep Pattern, Depression, Fearful and Frequent crying. Endocrine Not Present- Cold Intolerance, Excessive Hunger, Hair Changes, Heat Intolerance, Hot flashes and  New Diabetes. Hematology Present- Blood Thinners and Easy Bruising. Not Present- Excessive bleeding, Gland problems, HIV and Persistent Infections.  Vitals  Weight: 199.38 lb Height: 62in Body Surface Area: 1.91 m Body Mass Index: 36.47 kg/m  Temp.: 98.3F(Oral)  Pulse: 73 (Regular)  BP: 130/84 (Sitting, Left Arm, Standard)       Physical Exam  General Mental Status-Alert. General Appearance-Consistent with stated age. Hydration-Well hydrated. Voice-Normal.  Integumentary Note: There is a 1.5 cm cyst on the right temporal scalp that is mobile. There is no sign of infection.   Head and Neck Head-normocephalic, atraumatic with no lesions or palpable masses. Trachea-midline. Thyroid Gland Characteristics - normal size and consistency.  Eye Eyeball - Bilateral-Extraocular movements intact. Sclera/Conjunctiva - Bilateral-No scleral icterus.  Chest and Lung Exam Chest and lung exam reveals -quiet, even and easy respiratory effort with no use of accessory muscles and on auscultation, normal breath sounds, no adventitious sounds and normal vocal resonance. Inspection Chest Wall - Normal. Back - normal.  Cardiovascular Cardiovascular examination reveals -normal heart sounds, regular rate and rhythm with no murmurs and normal pedal pulses bilaterally. Note: There is a systolic murmur  Abdomen Inspection Inspection of the abdomen reveals - No Hernias. Skin - Scar - no surgical scars. Palpation/Percussion Palpation and Percussion of the abdomen reveal - Soft, Non Tender, No Rebound tenderness, No Rigidity (guarding) and No hepatosplenomegaly. Auscultation Auscultation of the abdomen reveals - Bowel sounds normal.  Neurologic Neurologic evaluation  reveals -alert and oriented x 3 with no impairment of recent or remote memory. Mental Status-Normal.  Musculoskeletal Normal Exam - Left-Upper Extremity Strength Normal and Lower Extremity Strength Normal. Normal Exam - Right-Upper Extremity Strength Normal and Lower Extremity Strength Normal.  Lymphatic Head & Neck  General Head & Neck Lymphatics: Bilateral - Description - Normal. Axillary  General Axillary Region: Bilateral - Description - Normal. Tenderness - Non Tender. Femoral & Inguinal  Generalized Femoral & Inguinal Lymphatics: Bilateral - Description - Normal. Tenderness - Non Tender.    Assessment & Plan  SCALP CYST (L72.9) Impression: The patient appears to have a 1.5 cm cyst on the right temporal scalp area. Because this causes him some discomfort of it would be reasonable to remove it. He would also like to have this done. I have discussed with him in detail the risks and benefits of the operation as well as some of the technical aspects and he understands and wishes to proceed. He will need to be off blood thinners for a couple days prior to the procedure and he is having a cardiac ablation for atrial fibrillation next week. He will need cardiac clearance prior to scheduling his surgery as well.

## 2019-08-10 NOTE — Discharge Instructions (Signed)
No Tylenol until after 2pm No ibuprofen until after 4pm    Post Anesthesia Home Care Instructions  Activity: Get plenty of rest for the remainder of the day. A responsible individual must stay with you for 24 hours following the procedure.  For the next 24 hours, DO NOT: -Drive a car -Paediatric nurse -Drink alcoholic beverages -Take any medication unless instructed by your physician -Make any legal decisions or sign important papers.  Meals: Start with liquid foods such as gelatin or soup. Progress to regular foods as tolerated. Avoid greasy, spicy, heavy foods. If nausea and/or vomiting occur, drink only clear liquids until the nausea and/or vomiting subsides. Call your physician if vomiting continues.  Special Instructions/Symptoms: Your throat may feel dry or sore from the anesthesia or the breathing tube placed in your throat during surgery. If this causes discomfort, gargle with warm salt water. The discomfort should disappear within 24 hours.  If you had a scopolamine patch placed behind your ear for the management of post- operative nausea and/or vomiting:  1. The medication in the patch is effective for 72 hours, after which it should be removed.  Wrap patch in a tissue and discard in the trash. Wash hands thoroughly with soap and water. 2. You may remove the patch earlier than 72 hours if you experience unpleasant side effects which may include dry mouth, dizziness or visual disturbances. 3. Avoid touching the patch. Wash your hands with soap and water after contact with the patch.

## 2019-08-10 NOTE — Op Note (Signed)
08/10/2019  8:51 AM  PATIENT:  [name] Brown  68 y.o. male  PRE-OPERATIVE DIAGNOSIS:  CYST OF RIGHT SCALP  POST-OPERATIVE DIAGNOSIS:  CYST OF RIGHT SCALP  PROCEDURE:  Procedure(s): EXCISION OF CYST FROM RIGHT SCALP (Right)  SURGEON:  Surgeon(s) and Role:    * Jovita Kussmaul, MD - Primary  PHYSICIAN ASSISTANT:   ASSISTANTS: none   ANESTHESIA:   local and general  EBL:  minimal   BLOOD ADMINISTERED:none  DRAINS: none   LOCAL MEDICATIONS USED:  LIDOCAINE   SPECIMEN:  Source of Specimen:  cyst scalp  DISPOSITION OF SPECIMEN:  PATHOLOGY  COUNTS:  YES  TOURNIQUET:  * No tourniquets in log *  DICTATION: .Dragon Dictation   After informed consent was obtained the patient was brought to the operating room and placed in the supine position on the operating table.  After adequate induction of general anesthesia the patient's right temporal area was prepped with Betadine and draped in usual sterile manner.  An appropriate timeout was performed.  The cyst was readily apparent.  The area around the cyst was infiltrated with 1% lidocaine with epinephrine.  An elliptical incision was then made in the skin overlying the cyst.  The incision was carried through the skin and subcutaneous tissue sharply with tenotomy scissors until the entire cyst was removed.  It was sent to pathology for further evaluation.  It measured 1.5 cm in diameter.  Hemostasis was achieved using the Bovie electrocautery.  The incision was then closed with interrupted 4-0 Monocryl subcuticular stitches.  Dermabond dressings were applied.  The patient tolerated the procedure well.  At the end of the case all needle sponge and instrument counts were correct.  The patient was then awakened and taken to recovery in stable condition.  PLAN OF CARE: Discharge to home after PACU  PATIENT DISPOSITION:  PACU - hemodynamically stable.   Delay start of Pharmacological VTE agent (>24hrs) due to surgical blood loss or risk of  bleeding: not applicable

## 2019-08-10 NOTE — Interval H&P Note (Signed)
History and Physical Interval Note:  08/10/2019 8:09 AM  [name] Brown  has presented today for surgery, with the diagnosis of CYST OF RIGHT SCALP.  The various methods of treatment have been discussed with the patient and family. After consideration of risks, benefits and other options for treatment, the patient has consented to  Procedure(s): EXCISION OF CYST FROM RIGHT SCALP (Right) as a surgical intervention.  The patient's history has been reviewed, patient examined, no change in status, stable for surgery.  I have reviewed the patient's chart and labs.  Questions were answered to the patient's satisfaction.     Autumn Messing III

## 2019-08-10 NOTE — Transfer of Care (Signed)
Immediate Anesthesia Transfer of Care Note  Patient: [name] Brown  Procedure(s) Performed: EXCISION OF CYST FROM RIGHT SCALP (Right Scalp)  Patient Location: PACU  Anesthesia Type:General  Level of Consciousness: awake, alert , oriented and patient cooperative  Airway & Oxygen Therapy: Patient Spontanous Breathing and Patient connected to face mask oxygen  Post-op Assessment: Report given to RN and Post -op Vital signs reviewed and stable  Post vital signs: Reviewed and stable  Last Vitals:  Vitals Value Taken Time  BP    Temp    Pulse 81 08/10/19 0855  Resp 11 08/10/19 0855  SpO2 99 % 08/10/19 0855  Vitals shown include unvalidated device data.  Last Pain:  Vitals:   08/10/19 0743  TempSrc: Oral  PainSc:          Complications: No apparent anesthesia complications

## 2019-08-10 NOTE — Anesthesia Procedure Notes (Signed)
Procedure Name: LMA Insertion Date/Time: 08/10/2019 8:21 AM Performed by: Signe Colt, CRNA Pre-anesthesia Checklist: Patient identified, Emergency Drugs available, Suction available and Patient being monitored Patient Re-evaluated:Patient Re-evaluated prior to induction Oxygen Delivery Method: Circle system utilized Preoxygenation: Pre-oxygenation with 100% oxygen Induction Type: IV induction Ventilation: Mask ventilation without difficulty LMA: LMA inserted LMA Size: 4.0 Number of attempts: 1 Airway Equipment and Method: Bite block Placement Confirmation: positive ETCO2 Tube secured with: Tape Dental Injury: Teeth and Oropharynx as per pre-operative assessment

## 2019-08-10 NOTE — Anesthesia Postprocedure Evaluation (Signed)
Anesthesia Post Note  Patient: [name] Brown  Procedure(s) Performed: EXCISION OF CYST FROM RIGHT SCALP (Right Scalp)     Patient location during evaluation: PACU Anesthesia Type: General Level of consciousness: awake and alert Pain management: pain level controlled Vital Signs Assessment: post-procedure vital signs reviewed and stable Respiratory status: spontaneous breathing, nonlabored ventilation, respiratory function stable and patient connected to nasal cannula oxygen Cardiovascular status: blood pressure returned to baseline and stable Postop Assessment: no apparent nausea or vomiting Anesthetic complications: no    Last Vitals:  Vitals:   08/10/19 0915 08/10/19 0935  BP: (!) 160/76 (!) 159/85  Pulse: 71 74  Resp: 14 16  Temp:  36.6 C  SpO2: 94% 97%    Last Pain:  Vitals:   08/10/19 0935  TempSrc: Oral  PainSc: 0-No pain                 Kishawn Pickar S

## 2019-08-10 NOTE — Anesthesia Preprocedure Evaluation (Signed)
Anesthesia Evaluation  Patient identified by MRN, date of birth, ID band Patient awake    Reviewed: Allergy & Precautions, NPO status , Patient's Chart, lab work & pertinent test results  Airway Mallampati: II  TM Distance: >3 FB Neck ROM: Full    Dental no notable dental hx.    Pulmonary neg pulmonary ROS, former smoker,    Pulmonary exam normal breath sounds clear to auscultation       Cardiovascular hypertension, + CAD, + Past MI, + CABG and + Peripheral Vascular Disease  Normal cardiovascular exam Rhythm:Regular Rate:Normal     Neuro/Psych negative neurological ROS  negative psych ROS   GI/Hepatic negative GI ROS, Neg liver ROS,   Endo/Other  negative endocrine ROS  Renal/GU negative Renal ROS  negative genitourinary   Musculoskeletal negative musculoskeletal ROS (+)   Abdominal   Peds negative pediatric ROS (+)  Hematology negative hematology ROS (+)   Anesthesia Other Findings   Reproductive/Obstetrics negative OB ROS                             Anesthesia Physical Anesthesia Plan  ASA: III  Anesthesia Plan: General   Post-op Pain Management:    Induction: Intravenous  PONV Risk Score and Plan: 2 and Ondansetron, Dexamethasone and Treatment may vary due to age or medical condition  Airway Management Planned: LMA  Additional Equipment:   Intra-op Plan:   Post-operative Plan: Extubation in OR  Informed Consent: I have reviewed the patients History and Physical, chart, labs and discussed the procedure including the risks, benefits and alternatives for the proposed anesthesia with the patient or authorized representative who has indicated his/her understanding and acceptance.     Dental advisory given  Plan Discussed with: CRNA and Surgeon  Anesthesia Plan Comments:         Anesthesia Quick Evaluation

## 2019-08-13 ENCOUNTER — Telehealth: Payer: Self-pay

## 2019-08-13 ENCOUNTER — Encounter (HOSPITAL_BASED_OUTPATIENT_CLINIC_OR_DEPARTMENT_OTHER): Payer: Self-pay | Admitting: General Surgery

## 2019-08-13 LAB — SURGICAL PATHOLOGY

## 2019-08-13 NOTE — Telephone Encounter (Signed)
Done and pt aware

## 2019-08-17 ENCOUNTER — Other Ambulatory Visit (HOSPITAL_COMMUNITY): Payer: PPO

## 2019-08-18 ENCOUNTER — Other Ambulatory Visit (HOSPITAL_COMMUNITY)
Admission: RE | Admit: 2019-08-18 | Discharge: 2019-08-18 | Disposition: A | Discharge status: Home / Self Care | Payer: PPO | Source: Ambulatory Visit | Attending: Cardiovascular Disease | Admitting: Cardiovascular Disease

## 2019-08-18 DIAGNOSIS — Z20828 Contact with and (suspected) exposure to other viral communicable diseases: Secondary | ICD-10-CM | POA: Diagnosis not present

## 2019-08-18 DIAGNOSIS — Z01812 Encounter for preprocedural laboratory examination: Secondary | ICD-10-CM | POA: Diagnosis not present

## 2019-08-18 LAB — SARS CORONAVIRUS 2 (TAT 6-24 HRS): SARS Coronavirus 2: NEGATIVE

## 2019-08-20 ENCOUNTER — Encounter (HOSPITAL_BASED_OUTPATIENT_CLINIC_OR_DEPARTMENT_OTHER): Payer: PPO | Admitting: Cardiology

## 2019-08-21 ENCOUNTER — Other Ambulatory Visit: Payer: Self-pay

## 2019-08-21 ENCOUNTER — Ambulatory Visit (INDEPENDENT_AMBULATORY_CARE_PROVIDER_SITE_OTHER): Payer: PPO | Admitting: Internal Medicine

## 2019-08-21 DIAGNOSIS — Z79899 Other long term (current) drug therapy: Secondary | ICD-10-CM | POA: Diagnosis not present

## 2019-08-21 LAB — PULMONARY FUNCTION TEST
DL/VA % pred: 75 %
DL/VA: 3.13 ml/min/mmHg/L
DLCO unc % pred: 70 %
DLCO unc: 18.15 ml/min/mmHg
FEF 25-75 Post: 2.78 L/sec
FEF 25-75 Pre: 1.23 L/sec
FEF2575-%Change-Post: 125 %
FEF2575-%Pred-Post: 109 %
FEF2575-%Pred-Pre: 48 %
FEV1-%Change-Post: 22 %
FEV1-%Pred-Post: 76 %
FEV1-%Pred-Pre: 62 %
FEV1-Post: 2.48 L
FEV1-Pre: 2.03 L
FEV1FVC-%Change-Post: 0 %
FEV1FVC-%Pred-Pre: 94 %
FEV6-%Change-Post: 21 %
FEV6-%Pred-Post: 85 %
FEV6-%Pred-Pre: 69 %
FEV6-Post: 3.52 L
FEV6-Pre: 2.89 L
FEV6FVC-%Change-Post: -1 %
FEV6FVC-%Pred-Post: 104 %
FEV6FVC-%Pred-Pre: 105 %
FVC-%Change-Post: 22 %
FVC-%Pred-Post: 81 %
FVC-%Pred-Pre: 66 %
FVC-Post: 3.56 L
FVC-Pre: 2.9 L
Post FEV1/FVC ratio: 70 %
Post FEV6/FVC ratio: 99 %
Pre FEV1/FVC ratio: 70 %
Pre FEV6/FVC Ratio: 100 %
RV % pred: 142 %
RV: 3.33 L
TLC % pred: 96 %
TLC: 6.56 L

## 2019-08-21 NOTE — Progress Notes (Signed)
Full PFT performed today. °

## 2019-08-24 ENCOUNTER — Other Ambulatory Visit (HOSPITAL_COMMUNITY)
Admission: RE | Admit: 2019-08-24 | Discharge: 2019-08-24 | Disposition: A | Discharge status: Home / Self Care | Payer: PPO | Source: Ambulatory Visit | Attending: Cardiology | Admitting: Cardiology

## 2019-08-24 DIAGNOSIS — Z01812 Encounter for preprocedural laboratory examination: Secondary | ICD-10-CM | POA: Insufficient documentation

## 2019-08-24 DIAGNOSIS — Z20828 Contact with and (suspected) exposure to other viral communicable diseases: Secondary | ICD-10-CM | POA: Insufficient documentation

## 2019-08-25 LAB — SARS CORONAVIRUS 2 (TAT 6-24 HRS): SARS Coronavirus 2: NEGATIVE

## 2019-08-27 ENCOUNTER — Ambulatory Visit (HOSPITAL_BASED_OUTPATIENT_CLINIC_OR_DEPARTMENT_OTHER): Payer: PPO | Attending: Cardiology | Admitting: Cardiology

## 2019-08-27 ENCOUNTER — Other Ambulatory Visit: Payer: Self-pay

## 2019-08-27 ENCOUNTER — Telehealth: Payer: Self-pay

## 2019-08-27 DIAGNOSIS — G4733 Obstructive sleep apnea (adult) (pediatric): Secondary | ICD-10-CM | POA: Diagnosis not present

## 2019-08-27 NOTE — Telephone Encounter (Signed)
Notes recorded by Frederik Schmidt, RN on 08/27/2019 at 8:37 AM EST  Lpm with results/recommendations. 11/16  ------

## 2019-08-27 NOTE — Telephone Encounter (Signed)
-----   Message from Sueanne Margarita, MD sent at 08/25/2019 10:57 PM EST ----- Please let patient know that labs were normal.  Continue current medical therapy.

## 2019-09-02 NOTE — Procedures (Signed)
    Patient Name: [name] Brown, [name] Brown Date: 08/27/2019 Gender: Male D.O.B: [birth year] Age (years): 30 Referring Provider: Fransico Him MD, ABSM Height (inches): 70 Interpreting Physician: Fransico Him MD, ABSM Weight (lbs): 206 RPSGT: Carolin Coy BMI: 30 MRN: [id number] Neck Size: 15.00  CLINICAL INFORMATION The patient is referred for a CPAP titration to treat sleep apnea.  SLEEP STUDY TECHNIQUE As per the AASM Manual for the Scoring of Sleep and Associated Events v2.3 (April 2016) with a hypopnea requiring 4% desaturations.  The channels recorded and monitored were frontal, central and occipital EEG, electrooculogram (EOG), submentalis EMG (chin), nasal and oral airflow, thoracic and abdominal wall motion, anterior tibialis EMG, snore microphone, electrocardiogram, and pulse oximetry. Continuous positive airway pressure (CPAP) was initiated at the beginning of the study and titrated to treat sleep-disordered breathing.  MEDICATIONS Medications self-administered by patient taken the night of the study : GABAPENTIN, MONTELUKAST, TAMSULOSIN, TRAZODONE  TECHNICIAN COMMENTS Comments added by technician: PATIENT WAS ORDERED AS A CPAP TITRATION Comments added by scorer: N/A  RESPIRATORY PARAMETERS Optimal PAP Pressure (cm): N/A  AHI at Optimal Pressure (/hr):N/A Overall Minimal O2 (%):90.0  Supine % at Optimal Pressure (%): N/A Minimal O2 at Optimal Pressure (%): N/A   SLEEP ARCHITECTURE The study was initiated at 9:58:32 PM and ended at 4:44:34 AM.  Sleep onset time was 7.1 minutes and the sleep efficiency was 55.3%. The total sleep time was 224.5 minutes.  The patient spent 25.6% of the night in stage N1 sleep, 58.4% in stage N2 sleep, 0.0% in stage N3 and 16% in REM.Stage REM latency was 194.0 minutes  Wake after sleep onset was 174.4. Alpha intrusion was absent. Supine sleep was 19.38%.  CARDIAC DATA The 2 lead EKG demonstrated sinus rhythm. The mean heart rate was  46.5 beats per minute. Other EKG findings include: PVCs.  LEG MOVEMENT DATA The total Periodic Limb Movements of Sleep (PLMS) were 0. The PLMS index was 0.0. A PLMS index of <15 is considered normal in adults.  IMPRESSIONS - An optimal pressure could not be determined due to ongoing respiratory events with CPAP.  - Mild Central Sleep Apnea was noted during this titration (CAI = 6.1/h). - Significant oxygen desaturations were not observed during this titration (min O2 = 90.0%). - The patient snored with soft snoring volume during this titration study. - 2-lead EKG demonstrated: PVCs - Clinically significant periodic limb movements were not noted during this study. Arousals associated with PLMs were rare.  DIAGNOSIS - Obstructive Sleep Apnea (327.23 [G47.33 ICD-10])  RECOMMENDATIONS - Repeat in lab study using BiPAP.  - Avoid alcohol, sedatives and other CNS depressants that may worsen sleep apnea and disrupt normal sleep architecture. - Sleep hygiene should be reviewed to assess factors that may improve sleep quality. - Weight management and regular exercise should be initiated or continued.  [Electronically signed] 09/02/2019 09:50 PM  Fransico Him MD, ABSM Diplomate, American Board of Sleep Medicine

## 2019-09-03 NOTE — Progress Notes (Signed)
Should have f/u with Turner for OSA

## 2019-09-04 ENCOUNTER — Telehealth: Payer: Self-pay | Admitting: *Deleted

## 2019-09-04 DIAGNOSIS — G4733 Obstructive sleep apnea (adult) (pediatric): Secondary | ICD-10-CM

## 2019-09-04 NOTE — Telephone Encounter (Addendum)
Returned call: Informed patient of sleep study results and patient understanding was verbalized. Patient understands his sleep study showed they had an unsuccessful CPAP titration due to severity of OSA> Please set up for repeat study using BiPAP in sleep lab.  Patient is aware and agreeable to results. Bipap titration sent to sleep pool.

## 2019-09-04 NOTE — Telephone Encounter (Signed)
-----   Message from Sueanne Margarita, MD sent at 09/02/2019  9:54 PM EST ----- Please let patient know that they had an unsuccessful CPAP titration due to severity of OSA>  Please set up for repeat study using BiPAP in sleep lab.

## 2019-09-04 NOTE — Telephone Encounter (Signed)
Called results lmtcb. 

## 2019-09-05 NOTE — Addendum Note (Signed)
Addended by: [name] on: 09/05/2019 04:13 PM   Modules accepted: Orders

## 2019-09-10 ENCOUNTER — Other Ambulatory Visit: Payer: Self-pay

## 2019-09-10 ENCOUNTER — Encounter: Payer: Self-pay | Admitting: Family Medicine

## 2019-09-10 DIAGNOSIS — M5136 Other intervertebral disc degeneration, lumbar region: Secondary | ICD-10-CM

## 2019-09-11 NOTE — Telephone Encounter (Signed)
Patient has asked to be scheduled for his Bipap titration into the new year.

## 2019-09-12 ENCOUNTER — Telehealth: Payer: Self-pay | Admitting: *Deleted

## 2019-09-12 NOTE — Telephone Encounter (Signed)
PA submitted to [facility] for in lab sleep study.

## 2019-09-15 DIAGNOSIS — U071 COVID-19: Secondary | ICD-10-CM | POA: Diagnosis not present

## 2019-09-17 ENCOUNTER — Telehealth: Payer: Self-pay | Admitting: *Deleted

## 2019-09-17 NOTE — Telephone Encounter (Signed)
Staff message sent to [name] Brown received. Ok to schedule BIPAP titration. Auth # [id number]. Valid dates 09/11/19 to 12/10/19.

## 2019-09-17 NOTE — Telephone Encounter (Signed)
  Lauralee Evener, CMA  [name], CMA        [facility] Josem Kaufmann received. Ok to schedule. Auth # [id number]. Valid dates 09/11/19 to 12/10/19.   Previous Messages  ----- Message -----  From: [name], CMA  Sent: 09/11/2019 12:33 PM EST  To: [facility]  Subject: precert                      May be a duplicate not sure.   repeat study using BiPAP in sleep lab.

## 2019-09-24 ENCOUNTER — Telehealth: Payer: Self-pay | Admitting: Cardiovascular Disease

## 2019-09-24 NOTE — Telephone Encounter (Signed)
Not clear to me this is cardiac related

## 2019-09-24 NOTE — Telephone Encounter (Signed)
Really dizzy, legs collasped. Denies hitting head.  BP right after 103/47 HR 48.  Sat on floor few minutes.  No further dizziness.    Up at 6am and took medicines.  Had breakfast, vaccummed.  Stood up at 10:00 am walked to kitchen and turned around felt the dizziness.  Did not pass out.  His ankle and the backs of his legs are sore.  Feels good 123/61, 49-currently.  Diagnosed with Covid Dec 5 symptoms were restricted airway, aches, chills and is now symptom free.  Adv to stay hydrated, continue to monitor BP and that possibly moving one of his BP meds to later in the morning may help prevent this from occurring again.   Aware I will forward to his cardiologist and electrophysiologist for any further recommendations.

## 2019-09-24 NOTE — Telephone Encounter (Signed)
New Message      STAT if patient feels like he/she is going to faint   1) Are you dizzy now? Not now, but he did this morning and fell down.   2) Do you feel faint or have you passed out?  No   3) Do you have any other symptoms? No, pt says his legs are sore from falling but that is it   4) Have you checked your HR and BP (record if available)? BP 103/47 HR 48-50   93/45 101/55 109/54 108/57 97/48    Pt says he tested Positive for COVID on Dec 5th    Please call back

## 2019-09-26 ENCOUNTER — Other Ambulatory Visit: Payer: Self-pay | Admitting: Cardiovascular Disease

## 2019-10-15 ENCOUNTER — Other Ambulatory Visit: Payer: Self-pay | Admitting: Internal Medicine

## 2019-10-19 ENCOUNTER — Other Ambulatory Visit: Payer: Self-pay | Admitting: *Deleted

## 2019-10-19 MED ORDER — APIXABAN 5 MG PO TABS: 5.0000 mg | ORAL_TABLET | Freq: Two times a day (BID) | ORAL | 1 refills | Status: DC

## 2019-10-19 NOTE — Telephone Encounter (Signed)
Prescription refill request for Eliquis received.  Last office visit: 07/31/2019, Camn[place] Scr: 1.36, 03/16/2019 Age:  69 y.o. Weight: 95.4 kg   Prescription refill sent.

## 2019-10-22 ENCOUNTER — Other Ambulatory Visit (HOSPITAL_COMMUNITY): Payer: PPO

## 2019-10-23 ENCOUNTER — Ambulatory Visit (INDEPENDENT_AMBULATORY_CARE_PROVIDER_SITE_OTHER): Payer: PPO | Admitting: Family

## 2019-10-23 ENCOUNTER — Encounter: Payer: Self-pay | Admitting: Family

## 2019-10-23 DIAGNOSIS — G4700 Insomnia, unspecified: Secondary | ICD-10-CM | POA: Diagnosis not present

## 2019-10-23 DIAGNOSIS — G4733 Obstructive sleep apnea (adult) (pediatric): Secondary | ICD-10-CM | POA: Diagnosis not present

## 2019-10-23 MED ORDER — TRAZODONE HCL 100 MG PO TABS: 100.0000 mg | ORAL_TABLET | Freq: Every evening | ORAL | 0 refills | Status: DC | PRN

## 2019-10-23 NOTE — Progress Notes (Signed)
[name] Brown is a 69 y.o. male with the following history as recorded in EpicCare:  Patient Active Problem List   Diagnosis Date Noted  . OSA (obstructive sleep apnea) 08/27/2019  . Arthralgia 04/09/2019  . Throat congestion 02/23/2019  . Marijuana use 12/02/2018  . Elevated troponin 12/02/2018  . HLD (hyperlipidemia) 11/29/2018  . CAD (coronary artery disease) 11/29/2018  . Chest pain 11/29/2018  . Scalp cyst 10/25/2018  . Degenerative disc disease, lumbar 03/09/2018  . Routine general medical examination at a health care facility 09/22/2017  . BPH without urinary obstruction 07/22/2016  . Anemia, iron deficiency 07/11/2015  . Nonallopathic lesion-rib cage 05/29/2015  . Nonallopathic lesion of thoracic region 05/29/2015  . Nonallopathic lesion of cervical region 05/29/2015  . AV block, 2nd degree 10/29/2013  . Migraine- chronic Verapamil 10/29/2013  . Paroxysmal atrial fibrillation ([place]) 10/26/2013  . Carotid bruit- [id number] LICA by doppler [id number] 03/14/2012  . ALCOHOL USE 01/23/2009  . Elevated lipids 01/22/2009  . Essential hypertension 01/22/2009  . Bradycardia 01/22/2009  . Aortic aneurysm ([place]) 01/22/2009    Current Outpatient Medications  Medication Sig Dispense Refill  . amiodarone (PACERONE) 200 MG tablet Take 1 tablet (200 mg total) TWICE a day for one month, then reduce and take 1 tablet (200 mg total) ONCE daily 60 tablet 6  . apixaban (ELIQUIS) 5 MG TABS tablet Take 1 tablet (5 mg total) by mouth 2 (two) times daily. 180 tablet 1  . aspirin EC 81 MG EC tablet Take 1 tablet (81 mg total) by mouth daily.    . cholecalciferol (VITAMIN D) 25 MCG (1000 UT) tablet Take 1,000 Units by mouth daily.     . diclofenac sodium (VOLTAREN) 1 % GEL Apply 2 g topically 4 (four) times daily. 100 g 3  . divalproex (DEPAKOTE ER) 500 MG 24 hr tablet Take 1 tablet (500 mg total) by mouth daily. NEEDS ANNUAL VISIT FOR FURTHER REFILLS 90 tablet 3  . Doxycycline Hyclate 50 MG TABS Take 50  mg by mouth daily.     Marland Kitchen ezetimibe (ZETIA) 10 MG tablet Take 1 tablet (10 mg total) by mouth daily. 90 tablet 3  . gabapentin (NEURONTIN) 300 MG capsule TAKE 1 CAPSULE(300 MG) BY MOUTH TWICE DAILY 60 capsule 3  . Glucosamine-Chondroitin (GLUCOSAMINE CHONDR COMPLEX PO) Take 500 mg by mouth 2 (two) times daily.     . hydrALAZINE (APRESOLINE) 50 MG tablet TAKE 1 TABLET(50 MG) BY MOUTH TWICE DAILY 180 tablet 3  . HYDROcodone-acetaminophen (NORCO/VICODIN) 5-325 MG tablet Take 1-2 tablets by mouth every 6 (six) hours as needed for moderate pain or severe pain. 10 tablet 0  . isosorbide mononitrate (IMDUR) 30 MG 24 hr tablet TAKE 1 TABLET BY MOUTH EVERY DAY 90 tablet 2  . montelukast (SINGULAIR) 10 MG tablet Take 1 tablet (10 mg total) by mouth at bedtime. 30 tablet 3  . Multiple Vitamin (MULTIVITAMIN) capsule Take 1 capsule by mouth daily.    . niacin (NIASPAN) 1000 MG CR tablet Take 1,000 mg by mouth 2 (two) times daily.      . nitroGLYCERIN (NITROSTAT) 0.4 MG SL tablet DISSOLVE 1 TABLET UNDER THE TONGUE EVERY 5 MINUTES FOR 3 DOSES AS NEEDED FOR CHEST PAIN 25 tablet 6  . Omega-3 Fatty Acids (FISH OIL) 1200 MG CAPS Take 1,200 mg by mouth 2 (two) times daily.    . potassium chloride SA (KLOR-CON) 20 MEQ tablet TAKE 1 TABLET BY MOUTH EVERY DAY 90 tablet 3  . predniSONE (DELTASONE)  20 MG tablet Take 2 tablets (40 mg total) by mouth daily with breakfast. 10 tablet 0  . ramipril (ALTACE) 10 MG capsule TAKE 1 CAPSULE BY MOUTH TWICE DAILY 180 capsule 3  . tamsulosin (FLOMAX) 0.4 MG CAPS capsule TAKE 1 CAPSULE(0.4 MG) BY MOUTH DAILY 90 capsule 1  . tiZANidine (ZANAFLEX) 4 MG tablet Take 1 tablet (4 mg total) by mouth every 8 (eight) hours as needed for muscle spasms. 270 tablet 3  . traZODone (DESYREL) 100 MG tablet Take 1 tablet (100 mg total) by mouth at bedtime as needed for sleep. 90 tablet 0  . vitamin C (ASCORBIC ACID) 500 MG tablet Take 500 mg by mouth daily.     No current facility-administered  medications for this visit.    Allergies: Statins  Past Medical History:  Diagnosis Date  . Aortic aneurysm ([place]) Endograf 2009  . Arthritis   . Atrial fibrillation ([place])   . Bradycardia   . CAD (coronary artery disease) CABG 03/2000   Median sternotomy for coronary artery bypass grafting x 3 (left  . HTN (hypertension)   . Hx of CABG 2001   severe 3 vessel disease  . Hx of colonic polyp 06/2010   Hyperplastic   . Hyperlipidemia   . Myocardial infarction ([place])   . Sleep apnea     Past Surgical History:  Procedure Laterality Date  . CARDIOVERSION N/A 11/28/2018   Procedure: CARDIOVERSION;  Surgeon: Elouise Munroe, MD;  Location: [facility] ENDOSCOPY;  Service: Cardiovascular;  Laterality: N/A;  . COLONOSCOPY    . CORONARY ARTERY BYPASS GRAFT     AAA stent graft on 12/01/2007 at [facility]  coronary bypass in 2001. Dr. Roxy Manns.  Marland Kitchen FOOT SURGERY Left 07/19/2017  . LEFT HEART CATHETERIZATION WITH CORONARY ANGIOGRAM N/A 10/26/2013   Procedure: LEFT HEART CATHETERIZATION WITH CORONARY ANGIOGRAM;  Surgeon: Peter M Martinique, MD;  Location[facility] CATH LAB;  Service: Cardiovascular;  Laterality: N/A;  . MASS EXCISION Right 08/10/2019   Procedure: EXCISION OF CYST FROM RIGHT SCALP;  Surgeon: Jovita Kussmaul, MD;  Location: [place];  Service: General;  Laterality: Right;    Family History  Problem Relation Age of Onset  . Heart attack Mother   . Heart disease Mother   . Heart attack Father   . Heart disease Father   . Rectal cancer Neg Hx   . Stomach cancer Neg Hx   . Pancreatic cancer Neg Hx   . Esophageal cancer Neg Hx   . Colon polyps Neg Hx   . Colon cancer Neg Hx     Social History   Tobacco Use  . Smoking status: Former Smoker    Packs/day: 2.00    Years: 30.00    Pack years: 60.00    Types: Cigarettes    Quit date: 01/05/1992    Years since quitting: 27.8  . Smokeless tobacco: Never Used  . Tobacco comment: quit 19 yrs ago  Substance Use Topics   . Alcohol use: No    Subjective:   I connected with [name] Brown on 10/23/19 at 12:20 PM EST by a video enabled telemedicine application and verified that I am speaking with the correct person using two identifiers. Provider in office/ patient is at home; provider and patient are only 2 people on video call.     I discussed the limitations of evaluation and management by telemedicine and the availability of in person appointments. The patient expressed understanding and agreed  to proceed.  Patient has known sleep apnea and is in the process of being fitted for Bipap titration. Notes he is worn out from lack of sleep and getting very little relief from current dosage of Trazodone; wants to discuss alternatives to help him sleep;   Objective:  There were no vitals filed for this visit.  General: Well developed, well nourished, in no acute distress  Skin : Warm and dry.  Lungs: Respirations unlabored;  Neurologic: Alert and oriented; speech intact; face symmetrical;   Assessment:  1. OSA (obstructive sleep apnea)   2. Insomnia, unspecified type     Plan:  Encouraged to go ahead and get scheduled for bipap titration as feel this will offer very good improvement in his quality of life; will try increasing the Trazodone to 100 mg qhs prn; follow-up with his PCP if symptoms persist.   No follow-ups on file.  No orders of the defined types were placed in this encounter.   Requested Prescriptions   Signed Prescriptions Disp Refills  . traZODone (DESYREL) 100 MG tablet 90 tablet 0    Sig: Take 1 tablet (100 mg total) by mouth at bedtime as needed for sleep.

## 2019-10-24 ENCOUNTER — Telehealth: Payer: Self-pay | Admitting: Cardiology

## 2019-10-24 NOTE — Telephone Encounter (Signed)
Please forward to Gershon Cull

## 2019-10-24 NOTE — Telephone Encounter (Signed)
New Message  Pt was calling and want to talk with Dr. Radford Pax or nurse about sleep abnea test appt.   Please call to discuss

## 2019-10-25 ENCOUNTER — Encounter (HOSPITAL_BASED_OUTPATIENT_CLINIC_OR_DEPARTMENT_OTHER): Payer: PPO | Admitting: Cardiology

## 2019-10-25 NOTE — Telephone Encounter (Signed)
Called patient and left a message to the sleep lab to reschedule his Bipap titration at [phone] or call nina at [phone].

## 2019-10-29 ENCOUNTER — Other Ambulatory Visit: Payer: Self-pay | Admitting: Internal Medicine

## 2019-10-29 DIAGNOSIS — G44009 Cluster headache syndrome, unspecified, not intractable: Secondary | ICD-10-CM

## 2019-11-05 NOTE — Telephone Encounter (Signed)
Patient is re-scheduled for lab study on 11/11/19. Pt is scheduled for COVID screening on 11/08/19 prior to Bipap titration.  Patient understands he sleep study will be done at [facility] sleep lab. Patient understands he will receive a sleep packet in a week or so. Patient understands to call if he does not receive the sleep packet in a timely manner. Patient agrees with treatment and thanked me for call.

## 2019-11-08 ENCOUNTER — Inpatient Hospital Stay (HOSPITAL_COMMUNITY)
Admission: RE | Admit: 2019-11-08 | Discharge: 2019-11-08 | Disposition: A | Discharge status: Home / Self Care | Payer: PPO | Source: Ambulatory Visit

## 2019-11-08 NOTE — Progress Notes (Signed)
Patient was positive for COVID on 09/15/19 (care Everywhere) there is no need for retest before BIPAP titration per protocol

## 2019-11-11 ENCOUNTER — Ambulatory Visit (HOSPITAL_BASED_OUTPATIENT_CLINIC_OR_DEPARTMENT_OTHER): Payer: PPO | Admitting: Cardiology

## 2019-11-13 ENCOUNTER — Ambulatory Visit (HOSPITAL_BASED_OUTPATIENT_CLINIC_OR_DEPARTMENT_OTHER): Payer: PPO | Attending: Cardiology | Admitting: Cardiology

## 2019-11-13 ENCOUNTER — Other Ambulatory Visit: Payer: Self-pay

## 2019-11-13 DIAGNOSIS — G4733 Obstructive sleep apnea (adult) (pediatric): Secondary | ICD-10-CM | POA: Diagnosis not present

## 2019-11-13 DIAGNOSIS — Z79899 Other long term (current) drug therapy: Secondary | ICD-10-CM | POA: Insufficient documentation

## 2019-11-14 NOTE — Procedures (Signed)
   Patient Name: [name] Brown, [name] Brown Date: 11/13/2019 Gender: Male D.O.B: [birth year] Age (years): 50 Referring Provider: Fransico Him MD, ABSM Height (inches): 70 Interpreting Physician: Fransico Him MD, ABSM Weight (lbs): 206 RPSGT: Laren Everts BMI: 30 MRN: [id number] Neck Size: 15.00  CLINICAL INFORMATION The patient is referred for a BiPAP titration to treat sleep apnea.  SLEEP STUDY TECHNIQUE As per the AASM Manual for the Scoring of Sleep and Associated Events v2.3 (April 2016) with a hypopnea requiring 4% desaturations.  The channels recorded and monitored were frontal, central and occipital EEG, electrooculogram (EOG), submentalis EMG (chin), nasal and oral airflow, thoracic and abdominal wall motion, anterior tibialis EMG, snore microphone, electrocardiogram, and pulse oximetry. Bilevel positive airway pressure (BPAP) was initiated at the beginning of the study and titrated to treat sleep-disordered breathing.  MEDICATIONS Medications self-administered by patient taken the night of the study : GABAPENTIN, TRAZODONE, ACETAMINOPHEN, BENADRYL  RESPIRATORY PARAMETERS Optimal IPAP Pressure (cm): 16  AHI at Optimal Pressure (/hr) 2.5 Optimal EPAP Pressure (cm):12  Overall Minimal O2 (%):88.0  Minimal O2 at Optimal Pressure (%): 92.0  SLEEP ARCHITECTURE Start Time:10:06:56 PM  Stop Time:4:41:25 AM  Total Time (min):394.5  Total Sleep Time (min):340.5 Sleep Latency (min):10.4  Sleep Efficiency (%):86.3%  REM Latency (min):131.5  WASO (min): 43.6 Stage N1 (%):10.0%  Stage N2 (%): 66.8%  Stage N3 (%): 0.0%  Stage R (%): 23.2 Supine (%):18.50  Arousal Index (/hr):24.5   CARDIAC DATA The 2 lead EKG demonstrated sinus rhythm. The mean heart rate was 42.3 beats per minute. Other EKG findings include: None.  LEG MOVEMENT DATA The total Periodic Limb Movements of Sleep (PLMS) were 0. The PLMS index was 0.0. A PLMS index of <15 is considered normal in  adults.  IMPRESSIONS - An optimal PAP pressure was selected for this patient ( 16 / cm of water) - Mild Central Sleep Apnea was noted during this titration (CAI = 13.0/h). - Mild oxygen desaturations were observed during this titration (min O2 = 88.0%). - The patient snored with soft snoring volume. - No cardiac abnormalities were observed during this study. - Clinically significant periodic limb movements were not noted during this study. Arousals associated with PLMs were rare.  DIAGNOSIS - Obstructive Sleep Apnea (327.23 [G47.33 ICD-10])  RECOMMENDATIONS - Trial of BiPAP therapy on 16/12 cm H2O with a Medium size Fisher&Paykel Full Face Mask Simplus mask and heated humidification. - Avoid alcohol, sedatives and other CNS depressants that may worsen sleep apnea and disrupt normal sleep architecture. - Sleep hygiene should be reviewed to assess factors that may improve sleep quality. - Weight management and regular exercise should be initiated or continued. - Return to Sleep Center for re-evaluation after 10 weeks of therapy  [Electronically signed] 11/14/2019 10:31 PM  Fransico Him MD, ABSM Diplomate, American Board of Sleep Medicine

## 2019-11-15 ENCOUNTER — Telehealth: Payer: Self-pay | Admitting: *Deleted

## 2019-11-15 NOTE — Telephone Encounter (Signed)
-----   Message from Sueanne Margarita, MD sent at 11/14/2019 10:34 PM EST ----- Please let patient know that they had a successful PAP titration and let DME know that orders are in EPIC.  Please set up 10 week OV with me.

## 2019-11-15 NOTE — Telephone Encounter (Signed)
Informed patient of sleep study results and patient understanding was verbalized. Patient understands his sleep study showed they had a successful PAP titration and let DME know that orders are in EPIC. Please set up 10 week OV with me.   Upon patient request DME selection is [facility]. Patient understands he will be contacted by [facility] to set up his cpap. Patient understands to call if [facility] does not contact him with new setup in a timely manner. Patient understands they will be called once confirmation has been received from [facility] that they have received their new machine to schedule 10 week follow up appointment.  [facility] notified of new cpap order  Please add to airview Patient was grateful for the call and thanked me.    

## 2019-11-19 ENCOUNTER — Telehealth: Payer: Self-pay | Admitting: Cardiology

## 2019-11-19 DIAGNOSIS — G4733 Obstructive sleep apnea (adult) (pediatric): Secondary | ICD-10-CM

## 2019-11-19 NOTE — Telephone Encounter (Signed)
I am happy to refer him to Dr Redmond Baseman or Wilburn Cornelia to discuss the [facility] device or we can set up a virtual OV to discuss with me

## 2019-11-19 NOTE — Telephone Encounter (Signed)
Patient would like to discuss [facility] more.

## 2019-11-21 NOTE — Telephone Encounter (Signed)
Patient would like to be referred to ENT.

## 2019-11-21 NOTE — Telephone Encounter (Signed)
That is fine ok to refer to Dr. Wilburn Cornelia or Redmond Baseman

## 2019-11-22 NOTE — Telephone Encounter (Signed)
Referral has been placed. 

## 2019-11-23 ENCOUNTER — Ambulatory Visit (HOSPITAL_COMMUNITY)
Admission: RE | Admit: 2019-11-23 | Discharge: 2019-11-23 | Disposition: A | Discharge status: Home / Self Care | Payer: PPO | Source: Ambulatory Visit | Attending: Physician Assistant | Admitting: Physician Assistant

## 2019-11-23 ENCOUNTER — Other Ambulatory Visit: Payer: Self-pay

## 2019-11-23 DIAGNOSIS — I4819 Other persistent atrial fibrillation: Secondary | ICD-10-CM | POA: Diagnosis not present

## 2019-11-23 DIAGNOSIS — D6869 Other thrombophilia: Secondary | ICD-10-CM | POA: Insufficient documentation

## 2019-11-23 NOTE — Progress Notes (Signed)
Electrophysiology TeleHealth Note   Due to national recommendations of social distancing due to [place] 19, Audio/video telehealth visit is felt to be most appropriate for this patient at this time.  See consent below from today for patient consent regarding telehealth for the [facility]. Consent obtained verbally.   Date:  11/23/2019   ID:  [name] Brown, DOB November 08, 1950, MRN [id number]  Location: home  Provider location: [address] [place], [place] [address] Evaluation Performed: Follow up  PCP:  [name] Koch, MD  Primary Cardiologist:  Dr [name] Brown Primary Electrophysiologist: Dr [name] Brown   CC: Follow up for atrial fibrillation   History of Present Illness: [name] Brown is a 69 y.o. male who presents via audio/video conferencing for a telehealth visit today.   He has a history of coronary artery disease status post three-vessel CABG, hypertension, hyperlipidemia, and atrial fibrillation.  He was found to be in atrial fibrillation incidentally on office visit 08/30/2017.  He was put on Eliquis at the time.  Beta-blockers were not started due to resting bradycardia.  January 2020 he was noted increasing dyspnea on exertion.  He had a cardioversion 11/28/18 and was started on amiodarone. He does report today that he was diagnosed with OSA a few years ago but could not tolerate CPAP therapy. He is on Eliquis for a CHADS2VASC score of 3.   On follow up today, patient reports that he has done very well. He denies any heart racing or palpitations. He has seen Dr Radford Pax for his OSA and plans for referral to discuss Inspire device noted. He is tolerating the medication without difficulty. He is going to get his COVID vaccine today.  Today, he denies symptoms of palpitations, chest pain, shortness of breath, orthopnea, PND, lower extremity edema, claudication, dizziness, presyncope, syncope, bleeding, or neurologic sequela. The patient is tolerating medications without  difficulties and is otherwise without complaint today.    Atrial Fibrillation Risk Factors:  he does have symptoms or diagnosis of sleep apnea. he is not compliant with CPAP therapy. he does not have a history of rheumatic fever. he does not have a history of alcohol use. The patient does not have a history of early familial atrial fibrillation or other arrhythmias.  he has a BMI of There is no height or weight on file to calculate BMI..  Patient has not check BP or pulse recently.   Past Medical History:  Diagnosis Date  . Aortic aneurysm ([place]) Endograf 2009  . Arthritis   . Atrial fibrillation ([place])   . Bradycardia   . CAD (coronary artery disease) CABG 03/2000   Median sternotomy for coronary artery bypass grafting x 3 (left  . HTN (hypertension)   . Hx of CABG 2001   severe 3 vessel disease  . Hx of colonic polyp 06/2010   Hyperplastic   . Hyperlipidemia   . Myocardial infarction ([place])   . Sleep apnea    Past Surgical History:  Procedure Laterality Date  . CARDIOVERSION N/A 11/28/2018   Procedure: CARDIOVERSION;  Surgeon: Elouise Munroe, MD;  [facility] ENDOSCOPY;  Service: Cardiovascular;  Laterality: N/A;  . COLONOSCOPY    . CORONARY ARTERY BYPASS GRAFT     AAA stent graft on 12/01/2007 at [facility]  coronary bypass in 2001. Dr. Roxy Manns.  Marland Kitchen FOOT SURGERY Left 07/19/2017  . LEFT HEART CATHETERIZATION WITH CORONARY ANGIOGRAM N/A 10/26/2013   Procedure: LEFT HEART CATHETERIZATION WITH CORONARY ANGIOGRAM;  Surgeon: Collier Salina  M Martinique, MD;  Location: [facility] CATH LAB;  Service: Cardiovascular;  Laterality: N/A;  . MASS EXCISION Right 08/10/2019   Procedure: EXCISION OF CYST FROM RIGHT SCALP;  Surgeon: Jovita Kussmaul, MD;  Location: [place];  Service: General;  Laterality: Right;     Current Outpatient Medications  Medication Sig Dispense Refill  . amiodarone (PACERONE) 200 MG tablet Take 1 tablet (200 mg total) TWICE a day for one month,  then reduce and take 1 tablet (200 mg total) ONCE daily 60 tablet 6  . apixaban (ELIQUIS) 5 MG TABS tablet Take 1 tablet (5 mg total) by mouth 2 (two) times daily. 180 tablet 1  . aspirin EC 81 MG EC tablet Take 1 tablet (81 mg total) by mouth daily.    . cholecalciferol (VITAMIN D) 25 MCG (1000 UT) tablet Take 1,000 Units by mouth daily.     . diclofenac sodium (VOLTAREN) 1 % GEL Apply 2 g topically 4 (four) times daily. 100 g 3  . divalproex (DEPAKOTE ER) 500 MG 24 hr tablet TAKE 1 TABLET(500 MG) BY MOUTH DAILY 90 tablet 1  . Doxycycline Hyclate 50 MG TABS Take 50 mg by mouth daily.     Marland Kitchen ezetimibe (ZETIA) 10 MG tablet Take 1 tablet (10 mg total) by mouth daily. 90 tablet 3  . gabapentin (NEURONTIN) 300 MG capsule TAKE 1 CAPSULE(300 MG) BY MOUTH TWICE DAILY 60 capsule 3  . Glucosamine-Chondroitin (GLUCOSAMINE CHONDR COMPLEX PO) Take 500 mg by mouth 2 (two) times daily.     . hydrALAZINE (APRESOLINE) 50 MG tablet TAKE 1 TABLET(50 MG) BY MOUTH TWICE DAILY 180 tablet 3  . HYDROcodone-acetaminophen (NORCO/VICODIN) 5-325 MG tablet Take 1-2 tablets by mouth every 6 (six) hours as needed for moderate pain or severe pain. 10 tablet 0  . isosorbide mononitrate (IMDUR) 30 MG 24 hr tablet TAKE 1 TABLET BY MOUTH EVERY DAY 90 tablet 2  . montelukast (SINGULAIR) 10 MG tablet Take 1 tablet (10 mg total) by mouth at bedtime. 30 tablet 3  . Multiple Vitamin (MULTIVITAMIN) capsule Take 1 capsule by mouth daily.    . niacin (NIASPAN) 1000 MG CR tablet Take 1,000 mg by mouth 2 (two) times daily.      . nitroGLYCERIN (NITROSTAT) 0.4 MG SL tablet DISSOLVE 1 TABLET UNDER THE TONGUE EVERY 5 MINUTES FOR 3 DOSES AS NEEDED FOR CHEST PAIN 25 tablet 6  . Omega-3 Fatty Acids (FISH OIL) 1200 MG CAPS Take 1,200 mg by mouth 2 (two) times daily.    . potassium chloride SA (KLOR-CON) 20 MEQ tablet TAKE 1 TABLET BY MOUTH EVERY DAY 90 tablet 3  . predniSONE (DELTASONE) 20 MG tablet Take 2 tablets (40 mg total) by mouth daily with  breakfast. 10 tablet 0  . ramipril (ALTACE) 10 MG capsule TAKE 1 CAPSULE BY MOUTH TWICE DAILY 180 capsule 3  . tamsulosin (FLOMAX) 0.4 MG CAPS capsule TAKE 1 CAPSULE(0.4 MG) BY MOUTH DAILY 90 capsule 1  . tiZANidine (ZANAFLEX) 4 MG tablet Take 1 tablet (4 mg total) by mouth every 8 (eight) hours as needed for muscle spasms. 270 tablet 3  . traZODone (DESYREL) 100 MG tablet Take 1 tablet (100 mg total) by mouth at bedtime as needed for sleep. 90 tablet 0  . vitamin C (ASCORBIC ACID) 500 MG tablet Take 500 mg by mouth daily.     No current facility-administered medications for this encounter.    Allergies:   Statins   Social History:  The  patient  reports that he quit smoking about 27 years ago. His smoking use included cigarettes. He has a 60.00 pack-year smoking history. He has never used smokeless tobacco. He reports that he does not drink alcohol or use drugs.   Family History:  The patient's  family history includes Heart attack in his father and mother; Heart disease in his father and mother.    ROS:  Please see the history of present illness.   All other systems are personally reviewed and negative.   Exam: Well appearing, alert and conversant, regular work of breathing,  good skin color Eyes- anicteric, neuro- grossly intact, skin- no apparent rash or lesions or cyanosis, mouth- oral mucosa is pink   Recent Labs: 11/28/2018: B Natriuretic Peptide 111.3 03/16/2019: ALT 16; BUN 32; Creatinine, Ser 1.36; Hemoglobin 12.8; Platelets 187.0; Potassium 5.0; Pro B Natriuretic peptide (BNP) 287.0; Sodium 138 07/11/2019: TSH 0.867  personally reviewed    Other studies personally reviewed: Additional studies/ records that were reviewed today include: Epic notes, echocardiogram  Echo 11/08/18 1. The left ventricle appears to be normal in size, have mild wall thickness, with normal systolic function of 0000000. Echo evidence of indeterminate in diastolic filling patterns.  2. No regional wall  motion abnormalities seen.  3. Right ventricular systolic pressure is is normal.  4. The right ventricle is mildly enlarged in size, has normal wall thickness and normal systolic function.  5. Severely dilated left atrial size.  6. Moderately dilated right atrial size.  7. Mitral valve regurgitation is trivial by color flow Doppler.  8. The mitral valve normal in structure and function.  9. Normal tricuspid valve. 10. Tricuspid regurgitation is mild. 11. Aortic valve regurgitation is mild by color flow Doppler. 12. Aortic valve tricuspid. 13. Mild dilatation of the ascending aorta. 14. No atrial level shunt detected by color flow Doppler.   ASSESSMENT AND PLAN:  1. Persistent atrial fibrillation Patient appears to be maintaining SR. Continue amiodarone 200 mg daily. Plans to recheck echocardiogram to assess ablation candidacy noted. Continue Eliquis 5 mg BID  Recommend checking TSH/LFTs at his next in person visit.   This patients CHA2DS2-VASc Score and unadjusted Ischemic Stroke Rate (% per year) is equal to 3.2 % stroke rate/year from a score of 3  Above score calculated as 1 point each if present [CHF, HTN, DM, Vascular=MI/PAD/Aortic Plaque, Age if 65-74, or Male] Above score calculated as 2 points each if present [Age > 75, or Stroke/TIA/TE]  2. OSA The importance of adequate treatment of sleep apnea was discussed today in order to improve our ability to maintain sinus rhythm long term. Followed by Dr Radford Pax. Plans for referral for Inspire device noted.  3. CAD No anginal symptoms. Followed by Dr [name] Brown.  4. HTN No changes today.   Follow-up with Dr [name] Brown as scheduled and Dr [name] Brown per recall.  Current medicines are reviewed at length with the patient today.   The patient does not have concerns regarding his medicines.  The following changes were made today: none  Labs/ tests ordered today include: none No orders of the defined types were placed in this  encounter.   Patient Risk:  after full review of this patients clinical status, I feel that they are at moderate risk at this time.   Today, I have spent 7 minutes with the patient with telehealth technology discussing the above.  Gwenlyn Perking PA-C 11/23/2019 10:47 AM  [facility] [place], [place]  [address] [phone]   I hereby voluntarily request, consent and authorize the [facility] and its employed or contracted physicians, physician assistants, nurse practitioners or other licensed health care professionals (the Practitioner), to provide me with telemedicine health care services (the "Services") as deemed necessary by the treating Practitioner. I acknowledge and consent to receive the Services by the Practitioner via telemedicine. I understand that the telemedicine visit will involve communicating with the Practitioner through live audiovisual communication technology and the disclosure of certain medical information by electronic transmission. I acknowledge that I have been given the opportunity to request an in-person assessment or other available alternative prior to the telemedicine visit and am voluntarily participating in the telemedicine visit.   I understand that I have the right to withhold or withdraw my consent to the use of telemedicine in the course of my care at any time, without affecting my right to future care or treatment, and that the Practitioner or I may terminate the telemedicine visit at any time. I understand that I have the right to inspect all information obtained and/or recorded in the course of the telemedicine visit and may receive copies of available information for a reasonable fee.  I understand that some of the potential risks of receiving the Services via telemedicine include:   Delay or interruption in medical evaluation due to technological equipment failure or disruption;  Information  transmitted may not be sufficient (e.g. poor resolution of images) to allow for appropriate medical decision making by the Practitioner; and/or  In rare instances, security protocols could fail, causing a breach of personal health information.   Furthermore, I acknowledge that it is my responsibility to provide information about my medical history, conditions and care that is complete and accurate to the best of my ability. I acknowledge that Practitioner's advice, recommendations, and/or decision may be based on factors not within their control, such as incomplete or inaccurate data provided by me or distortions of diagnostic images or specimens that may result from electronic transmissions. I understand that the practice of medicine is not an exact science and that Practitioner makes no warranties or guarantees regarding treatment outcomes. I acknowledge that I will receive a copy of this consent concurrently upon execution via email to the email address I last provided but may also request a printed copy by calling the office of the [facility].  I understand that my insurance will be billed for this visit.   I have read or had this consent read to me.  I understand the contents of this consent, which adequately explains the benefits and risks of the Services being provided via telemedicine.  I have been provided ample opportunity to ask questions regarding this consent and the Services and have had my questions answered to my satisfaction.  I give my informed consent for the services to be provided through the use of telemedicine in my medical care  By participating in this telemedicine visit I agree to the above.

## 2019-11-27 ENCOUNTER — Telehealth: Payer: Self-pay | Admitting: Internal Medicine

## 2019-11-27 DIAGNOSIS — I10 Essential (primary) hypertension: Secondary | ICD-10-CM

## 2019-11-27 NOTE — Chronic Care Management (AMB) (Signed)
  Chronic Care Management   Note  11/27/2019 Name: [name] Brown MRN: [id number] DOB: August 15, 1951  [name] Brown is a 69 y.o. year old male who is a primary care patient of Hoyt Koch, MD. I reached out to [name] Brown by phone today in response to a referral sent by [name] Brown PCP, Hoyt Koch, MD.   [name] Brown was given information about Chronic Care Management services today including:  1. CCM service includes personalized support from designated clinical staff supervised by his physician, including individualized plan of care and coordination with other care providers 2. 24/7 contact phone numbers for assistance for urgent and routine care needs. 3. Service will only be billed when office clinical staff spend 20 minutes or more in a month to coordinate care. 4. Only one practitioner may furnish and bill the service in a calendar month. 5. The patient may stop CCM services at any time (effective at the end of the month) by phone call to the office staff. 6. The patient will be responsible for cost sharing (co-pay) of up to 20% of the service fee (after annual deductible is met).  Patient agreed to services and verbal consent obtained.   Follow up plan:   Raynicia Dukes [employer] Scheduler

## 2019-11-27 NOTE — Progress Notes (Signed)
  Chronic Care Management   Outreach Note  11/27/2019 Name: [name] Brown MRN: [id number] DOB: 1951/01/13  Referred by: Hoyt Koch, MD Reason for referral : No chief complaint on file.   An unsuccessful telephone outreach was attempted today. The patient was referred to the pharmacist for assistance with care management and care coordination.   Follow Up Plan:   Raynicia Dukes [employer] Scheduler

## 2019-12-03 NOTE — Chronic Care Management (AMB) (Signed)
Chronic Care Management Pharmacy  Name: [name] Brown  MRN: [id number] DOB: October 10, 1951   Chief Complaint/ HPI  [name],  69 y.o. , male presents for their initial CCM visit with the clinical pharmacist via telephone due to COVID-19 Pandemic.  PCP : Hoyt Koch, MD  Their chronic conditions include: HTN, Afib, CAD (CABG 2001), AV block, HLD, BPH, OSA, arthritis, migraines  Office Visits: 04/23/19 Dr Sharlet Salina VV: arthritis f/u, much improved after steroid course, cut down on Tylenol. Referred to sports medicine.  Consult Visit: 11/23/19 PA Fenton (cardiology EP): pt maintaining SR. No BB due to resting bradycardia. Emphasized need to control OSA, referred for [facility] device.  10/23/19 FNP Murray (OSA): Pt not sleeping well. Schedule for BiPAP titration, increased trazodone to 100 mg.   08/10/19 Admission for cyst evaluation and removal.   07/31/19 Dr Curt Bears (cardiology): EP eval. Considering ablation.  07/11/19 Dr Johnsie Cancel (cardiology): no med changes, ordered amio labs and carotid duplex.   05/15/19 Dr Tamala Julian (sports medicine): ordered lumbar MRI to evaluate back pain   Medications: Outpatient Encounter Medications as of 12/04/2019  Medication Sig  . amiodarone (PACERONE) 200 MG tablet Take 1 tablet (200 mg total) TWICE a day for one month, then reduce and take 1 tablet (200 mg total) ONCE daily  . apixaban (ELIQUIS) 5 MG TABS tablet Take 1 tablet (5 mg total) by mouth 2 (two) times daily.  Marland Kitchen aspirin EC 81 MG EC tablet Take 1 tablet (81 mg total) by mouth daily.  . cholecalciferol (VITAMIN D) 25 MCG (1000 UT) tablet Take 1,000 Units by mouth daily.   . diclofenac sodium (VOLTAREN) 1 % GEL Apply 2 g topically 4 (four) times daily.  . divalproex (DEPAKOTE ER) 500 MG 24 hr tablet TAKE 1 TABLET(500 MG) BY MOUTH DAILY  . Doxycycline Hyclate 50 MG TABS Take 50 mg by mouth daily.   Marland Kitchen ezetimibe (ZETIA) 10 MG tablet Take 1 tablet (10 mg total) by mouth daily.  . furosemide  (LASIX) 20 MG tablet Take 20 mg by mouth in the morning and at bedtime.  . gabapentin (NEURONTIN) 300 MG capsule TAKE 1 CAPSULE(300 MG) BY MOUTH TWICE DAILY  . Glucosamine-Chondroitin (GLUCOSAMINE CHONDR COMPLEX PO) Take 500 mg by mouth 2 (two) times daily.   . hydrALAZINE (APRESOLINE) 50 MG tablet TAKE 1 TABLET(50 MG) BY MOUTH TWICE DAILY  . isosorbide mononitrate (IMDUR) 30 MG 24 hr tablet TAKE 1 TABLET BY MOUTH EVERY DAY  . Multiple Vitamin (MULTIVITAMIN) capsule Take 1 capsule by mouth daily.  . niacin (NIASPAN) 1000 MG CR tablet Take 1,000 mg by mouth 2 (two) times daily.    . nitroGLYCERIN (NITROSTAT) 0.4 MG SL tablet DISSOLVE 1 TABLET UNDER THE TONGUE EVERY 5 MINUTES FOR 3 DOSES AS NEEDED FOR CHEST PAIN  . Omega-3 Fatty Acids (FISH OIL) 1200 MG CAPS Take 1,200 mg by mouth 2 (two) times daily.  . potassium chloride SA (KLOR-CON) 20 MEQ tablet TAKE 1 TABLET BY MOUTH EVERY DAY  . ramipril (ALTACE) 10 MG capsule TAKE 1 CAPSULE BY MOUTH TWICE DAILY  . tamsulosin (FLOMAX) 0.4 MG CAPS capsule TAKE 1 CAPSULE(0.4 MG) BY MOUTH DAILY  . tiZANidine (ZANAFLEX) 4 MG tablet Take 1 tablet (4 mg total) by mouth every 8 (eight) hours as needed for muscle spasms.  . traZODone (DESYREL) 100 MG tablet Take 1 tablet (100 mg total) by mouth at bedtime as needed for sleep. (Patient taking differently: Take 150 mg by mouth at bedtime as  needed for sleep. )  . vitamin C (ASCORBIC ACID) 500 MG tablet Take 500 mg by mouth daily.  Marland Kitchen HYDROcodone-acetaminophen (NORCO/VICODIN) 5-325 MG tablet Take 1-2 tablets by mouth every 6 (six) hours as needed for moderate pain or severe pain.  . montelukast (SINGULAIR) 10 MG tablet Take 1 tablet (10 mg total) by mouth at bedtime. (Patient not taking: Reported on 12/04/2019)  . predniSONE (DELTASONE) 20 MG tablet Take 2 tablets (40 mg total) by mouth daily with breakfast.   No facility-administered encounter medications on file as of 12/04/2019.     Current  Diagnosis/Assessment:  Goals Addressed            This Visit's Progress   . Pharmacy Care Plan       Current Barriers:  . Chronic Disease Management support, education, and care coordination needs related to CAD, HTN, and HLD  Pharmacist Clinical Goal(s):  Marland Kitchen Maintain BP < 130/80 . Ensure safety, efficacy and affordability of medications  Interventions: . Comprehensive medication review performed. . Pursue patient assistance for Eliquis prior to reaching coverage gap (~August 2021) . Do not exceed 150 mg of trazodone . Monitor for signs and symptoms of bleeding with Eliquis/aspirin  Patient Self Care Activities:  . Self administers medications as prescribed, Calls pharmacy for medication refills, and Calls provider office for new concerns or questions  Initial goal documentation       AFIB   Patient is currently rhythm controlled.  Patient has failed these meds in past: verapamil, metoprolol Patient is currently controlled on the following medications: amiodarone 200 mg daily, Eliquis 5 mg BID  We discussed: pt has no issues with amiodarone, has lab monitoring every 6 months. Pt denies s/sx of bleeding with Eliquis, does bruise more easily  Plan  Continue current medications   Hypertension   Office blood pressures are  BP Readings from Last 3 Encounters:  08/10/19 (!) 159/85  08/07/19 (!) 151/63  07/31/19 (!) 124/56    Patient has failed these meds in the past: verapamil, metoprolol, methyldopa, HCTZ  Patient is currently controlled on the following meds: ramipril 10 mg BID, hydralazine 50 mg BID, isosorbide MN 30 mg daily, furosemide 20 mg BID, Klor-Con 20 meq daily  Patient checks BP at home 1-2x per week  Patient home BP readings are ranging: 120/70s  We discussed diet and exercise extensively, BP goals, how isosorbide and hydralazine work together.   Plan  Continue current medications and control with diet and exercise     Hyperlipidemia/CAD    Lipid Panel     Component Value Date/Time   CHOL 114 11/29/2018 0223   TRIG 32 11/29/2018 0223   HDL 24 (L) 11/29/2018 0223   CHOLHDL 4.8 11/29/2018 0223   VLDL 6 11/29/2018 0223   LDLCALC 84 11/29/2018 0223     Patient has failed these meds in past: statins - myalgias and elevated CPK Patient is currently controlled on the following medications: ezetimibe 10 mg daily, niacin 1000 mg BID, nitroglycerin 0.4 mg SL prn, aspirin 81 mg, fish oil OTC  We discussed:  History of myalgias with statins. Pt has not had to use NTG in over a year, he still gets it refilled every 6 months. He gets non-flushing niacin to avoid side effect.   Plan  Continue current medications and control with diet and exercise    Migraines/cluster headaches   Patient has failed these meds in past: verapamil (AV block) Patient is currently controlled on the following medications: divalproex ER  500 mg daily  We discussed:  Medication works well for him, pt says he would be suffering without it.   Plan  Continue current medications   Back pain   Patient has failed these meds in past: steroid shots provide short relief (~10 days) Patient is currently controlled on the following medications: gabapentin 300 mg BID, diclofenac 1% gel, glucosamine-chondroitin,  tizanidine 4 mg q8h prn  We discussed: pain is relatively well controlled, he uses diclofenac every few days, and very rarely uses tizanidine (once every few months).  Plan  Continue current medications    Insomnia   Patient has failed these meds in past: n/a Patient is currently controlled on the following medications: trazodone 100 mg HS prn  We discussed: pt occasionally takes 150 mg if he still can't sleep. Discussed avoiding doses above 150 mg because sedation effect fades with higher doses.   Plan  Continue current medications   BPH   Patient has failed these meds in past: n/a Patient is currently controlled on the following  medications: tamsulosin 0.4 mg daily  We discussed: Med has improved urinary symptoms, "instead of getting up every 15 min I get up every 90 min"  Plan  Continue current medications   Allergies/congestion   Patient is currently controlled on the following medications: Afrin  We discussed:  Pt stopped taking montelukast because it was causing itchy throat/dry cough. Now using Afrin occasionally and this works for him.  Plan  Continue current medications   Health Maintenance   Patient is currently controlled on the following medications: Vitamin D 1000 IU, multivitamin, vitamin C  Plan  Continue current medications   Medication Management   Patient uses [facility] mail order [facility] Wife sets up pill box each Monday Pt endorses 100% compliance  We discussed:  Benefits of medication synchronization, packaging and delivery with [employer]. Pt is happy with his current system of managing meds at home.   Pt states Eliquis is ~$45 for 3 month supply until he reaches donut hole, which last year was around October. We will plan to pursue patient assistance around August to ensure affordability once donut hole is reached.  Plan  Continue current medication management strategy Pursue Eliquis PAP at follow up visit   Follow up: 6 month phone visit  Charlene Brooke, PharmD Clinical Pharmacist [facility] at [facility] [phone]

## 2019-12-04 ENCOUNTER — Ambulatory Visit: Payer: PPO | Admitting: Pharmacist

## 2019-12-04 ENCOUNTER — Other Ambulatory Visit: Payer: Self-pay

## 2019-12-04 DIAGNOSIS — G44009 Cluster headache syndrome, unspecified, not intractable: Secondary | ICD-10-CM

## 2019-12-04 DIAGNOSIS — E785 Hyperlipidemia, unspecified: Secondary | ICD-10-CM

## 2019-12-04 DIAGNOSIS — M549 Dorsalgia, unspecified: Secondary | ICD-10-CM

## 2019-12-04 DIAGNOSIS — G4700 Insomnia, unspecified: Secondary | ICD-10-CM

## 2019-12-04 DIAGNOSIS — I10 Essential (primary) hypertension: Secondary | ICD-10-CM

## 2019-12-04 NOTE — Patient Instructions (Addendum)
Visit Information  Thank you for meeting with me to discuss your medications! I look forward to working with you to achieve your health care goals. Below is a summary of what we talked about during the visit:  Goals Addressed            This Visit's Progress   . Pharmacy Care Plan       Current Barriers:  . Chronic Disease Management support, education, and care coordination needs related to CAD, HTN, and HLD  Pharmacist Clinical Goal(s):  Marland Kitchen Maintain BP < 130/80 . Ensure safety, efficacy and affordability of medications  Interventions: . Comprehensive medication review performed. . Pursue patient assistance for Eliquis prior to reaching coverage gap (~August 2021) . Do not exceed 150 mg of trazodone . Monitor for signs and symptoms of bleeding with Eliquis/aspirin  Patient Self Care Activities:  . Self administers medications as prescribed, Calls pharmacy for medication refills, and Calls provider office for new concerns or questions  Initial goal documentation       Mr. Hires was given information about Chronic Care Management services today including:  1. CCM service includes personalized support from designated clinical staff supervised by his physician, including individualized plan of care and coordination with other care providers 2. 24/7 contact phone numbers for assistance for urgent and routine care needs. 3. Service will only be billed when office clinical staff spend 20 minutes or more in a month to coordinate care. 4. Only one practitioner may furnish and bill the service in a calendar month. 5. The patient may stop CCM services at any time (effective at the end of the month) by phone call to the office staff. 6. The patient will be responsible for cost sharing (co-pay) of up to 20% of the service fee (after annual deductible is met).  Patient agreed to services and verbal consent obtained.   Print copy of patient instructions provided.  Telephone follow up  appointment with pharmacy team member scheduled for: 06/03/20 @ 11:   Charlene Brooke, PharmD Clinical Pharmacist [facility] at [facility] [phone]    Preventing Atrial Fibrillation-Related Stroke  Atrial fibrillation is a common type of irregular or rapid heartbeat (arrhythmia) that greatly increases your risk for a stroke. In atrial fibrillation, the top portions of the heart (atria) beat out of sync with the lower portions of the heart. When the muscles of the atria are tightening in an uncoordinated way (fibrillating), blood can pool in the heart and form clots. If a clot travels to the brain, it can cause a stroke. This type of stroke is preventable. Understanding atrial fibrillation and knowing how to properly manage it can prevent you from having a stroke. What increases my risk for a stroke? If you have atrial fibrillation, you may be at increased risk for a stroke if you also:  Have heart failure.  Have high blood pressure.  Are older than age 64.  Have diabetes.  Have a history of vascular disease, such as heart attack or stroke.  Are male. If you have atrial fibrillation and you also have one or more of those risk factors, talk with your health care provider about treatments that can prevent a stroke. Other risk factors for a stroke include:  Smoking.  High cholesterol.  Diabetes.  Being inactive (sedentary lifestyle).  Having a family history of stroke.  Eating a diet that is high in fat, cholesterol, and salt. What treatments help to manage atrial fibrillation? The main goals of treatment  for atrial fibrillation are to prevent blood clots from forming and to keep your heart beating at a normal rate and rhythm. Treatment may include:  Blood-thinning medicine (anticoagulant) that helps to prevent clots from forming. This medicine also increases the risk of bleeding. Talk with your health care provider about the risks and benefits of taking  anticoagulants.  Medicine that slows the heart rate or brings the heart rhythm back to normal.  Electrical cardioversion. This is a procedure that resets the heart's rhythm by delivering a controlled, low-energy shock through your skin to your heart.  An ablation procedure, such as catheter ablation, catheter ablation with pacemaker, or surgical ablation. These procedures destroy the heart tissues that send abnormal signals so that heart rhythms can be improved or made normal. A pacemaker is a device that is placed under the skin to help the heart beat in a regular rhythm. How can I prevent atrial fibrillation-related stroke? Medicines  Take over-the-counter and prescription medicines only as told by your health care provider.  If your health care provider prescribed an anticoagulant, take it exactly as told. Taking too much blood-thinning medicine can cause bleeding. If you do not take enough blood-thinning medicine, you will not have the protection that you need against stroke and other problems. Eating and drinking  Eat healthy foods, including at least 5 servings of fruits and vegetables a day.  Do not drink alcohol.  Do not drink beverages that contain caffeine, such as coffee, soda, and tea.  Follow dietary instructions as told by your health care provider. Managing other medical conditions  Manage and be aware of your blood pressure. If you have high blood pressure, follow your treatment plan to keep it in your target range.  Have your cholesterol checked as often as recommended by your health care provider. If you have high cholesterol, follow your treatment plan to lower it and keep it in your target range.  Talk with your health care provider about symptoms to watch for. Some people may not have any symptoms, so it can be hard to know that they have atrial fibrillation. Talk with your health care provider if you experience: ? A feeling that your heart is beating rapidly or  irregularly. ? An irregular pulse. ? A feeling of discomfort or pain in your chest. ? Shortness of breath. ? Sudden light-headedness or weakness. ? Tiredness (fatigue) that happens easily during exercise.  If you have obstructive sleep apnea (OSA), manage your condition as told by your health care provider. General instructions  Maintain a healthy weight. Do not use diet pills unless your health care provider approves. Diet pills may make heart problems worse.  Exercise regularly. Get at least 30 minutes of activity on most or all days, or as told by your health care provider.  Do not use any products that contain nicotine or tobacco, such as cigarettes and e-cigarettes. If you need help quitting, ask your health care provider.  Do not use drugs, such as cocaine and amphetamines.  Keep all follow-up visits as told by your health care providers. This is important. These include visits with your heart specialist. Where to find more information You may find more information about preventing atrial fibrillation-related stroke from:  National Stroke Association (AFib-Stroke Connection): www.stroke.org Contact a health care provider if:  You notice a change in the rate, rhythm, or strength of your heartbeat.  You have dizziness.  You are taking an anticoagulant and you have more bruises than usual.  You tire  out more easily when you exercise or do similar activities. Get help right away if:   You have chest pain.  You have pain in your abdomen.  You experience unusual sweating or weakness.  You take anticoagulants and you: ? Have severe headaches or confusion. ? Have blood in your vomit, bowel movement, or urine. ? Have bleeding that will not stop. ? Fall or injure your head.  You have any symptoms of a stroke. "BE FAST" is an easy way to remember the main warning signs of a stroke: ? B - Balance. Signs are dizziness, trouble walking, or loss of balance. ? E - Eyes. Signs  are trouble seeing or a sudden change in vision. ? F - Face. Signs are sudden weakness or numbness of the face, or the face or eyelid drooping on one side. ? A - Arms. Signs are weakness or numbness in an arm. This happens suddenly and usually on one side of the body. ? S - Speech. Signs are sudden trouble speaking, slurred speech, or trouble understanding what people say. ? T - Time. Time to call emergency services. Write down what time symptoms started.  You have other signs of a stroke, such as: ? A sudden, severe headache with no known cause. ? Nausea or vomiting. ? Seizure. These symptoms may represent a serious problem that is an emergency. Do not wait to see if the symptoms will go away. Get medical help right away. Call your local emergency services (911 in the [place] Do not drive yourself to the hospital. Summary  Having atrial fibrillation increases the risk for a stroke. Talk with your health care provider about what symptoms to watch for.  Atrial fibrillation-related stroke is preventable. Proper management of atrial fibrillation can prevent you from having a stroke.  Talk with your health care provider about whether anticoagulant medicine is right for you.  Learn the warning signs of a stroke and remember "BE FAST." This information is not intended to replace advice given to you by your health care provider. Make sure you discuss any questions you have with your health care provider. Document Revised: 01/22/2019 Document Reviewed: 01/12/2017 Elsevier Patient Education  [employer].

## 2019-12-05 ENCOUNTER — Other Ambulatory Visit: Payer: Self-pay

## 2019-12-05 MED ORDER — EZETIMIBE 10 MG PO TABS: 10.0000 mg | ORAL_TABLET | Freq: Every day | ORAL | 3 refills | Status: DC

## 2019-12-05 NOTE — Addendum Note (Signed)
Addended by: Karle Barr on: 12/05/2019 08:56 PM   Modules accepted: Orders

## 2019-12-13 ENCOUNTER — Other Ambulatory Visit: Payer: Self-pay | Admitting: General Practice

## 2019-12-13 ENCOUNTER — Other Ambulatory Visit: Payer: Self-pay | Admitting: Internal Medicine

## 2019-12-13 DIAGNOSIS — R3912 Poor urinary stream: Secondary | ICD-10-CM

## 2019-12-13 DIAGNOSIS — N401 Enlarged prostate with lower urinary tract symptoms: Secondary | ICD-10-CM

## 2019-12-13 MED ORDER — TAMSULOSIN HCL 0.4 MG PO CAPS: ORAL_CAPSULE | ORAL | 0 refills | Status: DC

## 2019-12-19 ENCOUNTER — Other Ambulatory Visit: Payer: Self-pay | Admitting: Cardiovascular Disease

## 2019-12-20 ENCOUNTER — Telehealth: Payer: Self-pay | Admitting: Cardiology

## 2019-12-20 DIAGNOSIS — G4733 Obstructive sleep apnea (adult) (pediatric): Secondary | ICD-10-CM | POA: Diagnosis not present

## 2019-12-20 NOTE — Telephone Encounter (Signed)
[name] Brown from Dr. Redmond Baseman office is calling to get Sleep Study results. I explained that we would need a request of records faxed over in order to get them faxed to her. She stated that the patient came in earlier but unfortunately did not sign a release of records form. I told her he could come into the office and pick them up but once again she stated that he has already come by the office once today. She wants to know since he was referred by Dr. Radford Pax for an inspire consult and he should have came with those results initially, if we can fax those over without a release being signed. Spoke to Dr. Theodosia Blender nurse who was confused on what to do as well. Not sure if med records can give some insight on what we could do.

## 2019-12-31 NOTE — Progress Notes (Signed)
Patient ID: [name] Brown, male   DOB: 30-Dec-1950, 69 y.o.   MRN: [id number]    69 y.o. 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  Last . Cath by Dr Martinique 2015  showed stable disease for medical Rx The free RIMA to circumflex was occluded with patent LIMA to LAD and RIMA to RCA Mid circumflex with 30-40% disease and OM3 with 50-60% PVD followed by Dr Doren Custard post EVAR    02/2018  Duplex  Left ICA 40-59% stenosis.   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 This occurs when traveling Walking up drive way He has noted tightness in chest that may be wheezing. Functional status has Decreased quite a bit Myovue non ischemic with normal EF and echo same. BNP only 288 CXR Elevated left hemidiaphragm and scarring   Started on amiodarone by Dr Curt Bears for afib 12/06/18 not thought to be an ablation candidate as LA is 64 mm by echo TSH normal January 2020 and LFTls normal 03/16/19 Has seen eye doctor Needs f/u amiodarone labs  Amiodarone level low on 07/11/19 TSH normal LFTs normal 03/16/19 Seen in [place] [facility] 11/23/19 and was in NSR Plan to f/u with Dr Curt Bears with echo and if some LA remodeling to discuss ablation   Having back issues and had injection Also getting cyst removed from scalp with Dr Marlou Starks. Holding eliquis with no issues for 3 days for each   Had COVID back in December not that sick and since has had vaccine  Has OSA and sees Dr Armanda Heritage but worse tried bipap and couldn't tolerate now seeing Redmond Baseman For stimulator   ROS: Denies fever, malais, weight loss, blurry vision, decreased visual acuity, cough,  sputum, SOB, hemoptysis, pleuritic pain, palpitaitons, heartburn, abdominal pain, melena, lower extremity edema, claudication, or rash.  All other systems reviewed and negative  General: BP 128/60   Pulse (!) 57   Ht 5\' 10"  (1.778 m)   Wt 216 lb (98 kg)   SpO2 98%   BMI 30.99 kg/m  Affect appropriate Healthy:  appears stated age 39: normal Neck supple with no adenopathy JVP normal left  bruits no thyromegaly Lungs mild upper lobe  wheezing and good diaphragmatic motion Heart:  S1/S2 SEM murmur, no rub, gallop or click PMI normal Abdomen: benighn, BS positve, no tenderness, no AAA no bruit.  No HSM or HJR Distal pulses intact with no bruits No edema Neuro non-focal Skin warm and dry No muscular weakness   Current Outpatient Medications  Medication Sig Dispense Refill  . amiodarone (PACERONE) 200 MG tablet Take 1 tablet (200 mg total) by mouth daily. TAKE 1 TABLET BY MOUTH TWICE DAILY FOR 1 MONTH, THEN REDUCE AND TAKE 1 TABLET ONCE DAILY 30 tablet 6  . apixaban (ELIQUIS) 5 MG TABS tablet Take 1 tablet (5 mg total) by mouth 2 (two) times daily. 180 tablet 1  . aspirin EC 81 MG EC tablet Take 1 tablet (81 mg total) by mouth daily.    . cholecalciferol (VITAMIN D) 25 MCG (1000 UT) tablet  Take 1,000 Units by mouth daily.     . diclofenac sodium (VOLTAREN) 1 % GEL Apply 2 g topically 4 (four) times daily. 100 g 3  . divalproex (DEPAKOTE ER) 500 MG 24 hr tablet TAKE 1 TABLET(500 MG) BY MOUTH DAILY 90 tablet 1  . Doxycycline Hyclate 50 MG TABS Take 50 mg by mouth daily.     Marland Kitchen ezetimibe (ZETIA) 10 MG tablet Take 1 tablet (10 mg total) by mouth daily. 90 tablet 3  . furosemide (LASIX) 20 MG tablet TAKE 1 TABLET(20 MG) BY MOUTH TWICE DAILY 180 tablet 1  . gabapentin (NEURONTIN) 300 MG capsule TAKE 1 CAPSULE(300 MG) BY MOUTH TWICE DAILY 60 capsule 3  . Glucosamine-Chondroitin (GLUCOSAMINE CHONDR COMPLEX PO) Take 500 mg by mouth 2 (two) times daily.     . hydrALAZINE (APRESOLINE) 50 MG  tablet TAKE 1 TABLET(50 MG) BY MOUTH TWICE DAILY 180 tablet 3  . HYDROcodone-acetaminophen (NORCO/VICODIN) 5-325 MG tablet Take 1-2 tablets by mouth every 6 (six) hours as needed for moderate pain or severe pain. 10 tablet 0  . isosorbide mononitrate (IMDUR) 30 MG 24 hr tablet TAKE 1 TABLET BY MOUTH EVERY DAY 90 tablet 2  . montelukast (SINGULAIR) 10 MG tablet Take 1 tablet (10 mg total) by mouth at bedtime. 30 tablet 3  . Multiple Vitamin (MULTIVITAMIN) capsule Take 1 capsule by mouth daily.    . niacin (NIASPAN) 1000 MG CR tablet Take 1,000 mg by mouth 2 (two) times daily.      . nitroGLYCERIN (NITROSTAT) 0.4 MG SL tablet DISSOLVE 1 TABLET UNDER THE TONGUE EVERY 5 MINUTES FOR 3 DOSES AS NEEDED FOR CHEST PAIN 25 tablet 6  . Omega-3 Fatty Acids (FISH OIL) 1200 MG CAPS Take 1,200 mg by mouth 2 (two) times daily.    . potassium chloride SA (KLOR-CON) 20 MEQ tablet TAKE 1 TABLET BY MOUTH EVERY DAY 90 tablet 3  . predniSONE (DELTASONE) 20 MG tablet Take 2 tablets (40 mg total) by mouth daily with breakfast. 10 tablet 0  . ramipril (ALTACE) 10 MG capsule TAKE 1 CAPSULE BY MOUTH TWICE DAILY 180 capsule 3  . tamsulosin (FLOMAX) 0.4 MG CAPS capsule TAKE 1 CAPSULE(0.4 MG) BY MOUTH DAILY 90 capsule 0  . tiZANidine (ZANAFLEX) 4 MG tablet Take 1 tablet (4 mg total) by mouth every 8 (eight) hours as needed for muscle spasms. 270 tablet 3  . traZODone (DESYREL) 100 MG tablet Take 1 tablet (100 mg total) by mouth at bedtime as needed for sleep. (Patient taking differently: Take 150 mg by mouth at bedtime as needed for sleep. ) 90 tablet 0  . vitamin C (ASCORBIC ACID) 500 MG tablet Take 500 mg by mouth daily.     No current facility-administered medications for this visit.    Allergies  Other and Statins  Electrocardiogram:  07/17/14  SR possible old IMI no change from 2014   10/15/14 SR rate 59 PR 224  LAFB   Assessment and Plan  CAD: Occluded free radial to OM EF normal by TTE 02/27/18  Normal myovue  11/08/18   AAA:  Post EVAR f/u [place] residual lumen 3.2 cm on duplex 05/10/18  Bruit: duplex [id number] stable [id number] LICA stenosis f/u duplex ordered   HTN: Well controlled. Hydralazine helps  Continue current medications and low sodium Dash type diet.    Chol:   Cholesterol is at goal.  Continue current dose of statin and diet Rx.  No myalgias or side effects.  F/U  LFT's  in 6 months. Lab Results  Component Value Date   LDLCALC 84 11/29/2018  Labs with primary   Urology:  Urinary frequency at night better on flomax f/u primary   Afib: Diagnosed November 2018  Asymptomatic  CHA2Vasc 3  eliquis 5 bid known History of SSS  LA 60 mm severely dilated rate control and anticoagulation strategy Holter 11/08/18 reviewed Average HR 54 bpm longest pause 3.6 seconds referred to EP Seen by [facility] and started on amiodarone  To have f/u echo in October and discuss ablation   Dyspnea:  No ischemia on myovue and normal EF by TTE 11/08/18 as well CXR showed chronic Elevation in left hemidiaphragm with atelectasis and scarring Dyspnea would not appear to be Related to cardiac abnormality BNP 03/16/19 only 287 DLCO 08/21/19 only mild decrease in DLCO  Anemia:  Hct 36.7 03/16/19 normal ferritin f/u primary   OSA:  Severe intolerant to bipap, CPAP not effective enough f/u ENT Redmond Baseman for stimulator        Ordered:  Carotid duplex left bruit known disease  F/U [place] in October with echo for LA size  F/U [place] for OSA F/U with me in a year     [employer]

## 2020-01-03 ENCOUNTER — Other Ambulatory Visit: Payer: Self-pay | Admitting: Cardiology

## 2020-01-04 ENCOUNTER — Other Ambulatory Visit: Payer: Self-pay

## 2020-01-04 ENCOUNTER — Ambulatory Visit: Payer: PPO | Admitting: Cardiovascular Disease

## 2020-01-04 ENCOUNTER — Encounter: Payer: Self-pay | Admitting: Cardiovascular Disease

## 2020-01-04 VITALS — BP 128/60 | HR 57 | Ht 70.0 in | Wt 216.0 lb

## 2020-01-04 DIAGNOSIS — I2510 Atherosclerotic heart disease of native coronary artery without angina pectoris: Secondary | ICD-10-CM | POA: Diagnosis not present

## 2020-01-04 DIAGNOSIS — I4819 Other persistent atrial fibrillation: Secondary | ICD-10-CM | POA: Diagnosis not present

## 2020-01-04 DIAGNOSIS — G4730 Sleep apnea, unspecified: Secondary | ICD-10-CM

## 2020-01-04 DIAGNOSIS — R0989 Other specified symptoms and signs involving the circulatory and respiratory systems: Secondary | ICD-10-CM

## 2020-01-04 NOTE — Patient Instructions (Addendum)
Medication Instructions:  Your physician recommends that you continue on your current medications as directed. Please refer to the Current Medication list given to you today.  *If you need a refill on your cardiac medications before your next appointment, please call your pharmacy*   Lab Work: NONE If you have labs (blood work) drawn today and your tests are completely normal, you will receive your results only by: Marland Kitchen MyChart Message (if you have MyChart) OR . A paper copy in the mail If you have any lab test that is abnormal or we need to change your treatment, we will call you to review the results.   Testing/Procedures: Your physician has requested that you have an echocardiogram BEFORE APPT WITH DR. Curt Bears. Echocardiography is a painless test that uses sound waves to create images of your heart. It provides your doctor with information about the size and shape of your heart and how well your heart's chambers and valves are working. This procedure takes approximately one hour. There are no restrictions for this procedure.   Your physician has requested that you have a carotid duplex. This test is an ultrasound of the carotid arteries in your neck. It looks at blood flow through these arteries that supply the brain with blood. Allow one hour for this exam. There are no restrictions or special instructions.   Follow-Up: At [facility], you and your health needs are our priority.  As part of our continuing mission to provide you with exceptional heart care, we have created designated Provider Care Teams.  These Care Teams include your primary Cardiologist (physician) and Advanced Practice Providers (APPs -  Physician Assistants and Nurse Practitioners) who all work together to provide you with the care you need, when you need it.  We recommend signing up for the patient portal called "MyChart".  Sign up information is provided on this After Visit Summary.  MyChart is used to connect with  patients for Virtual Visits (Telemedicine).  Patients are able to view lab/test results, encounter notes, upcoming appointments, etc.  Non-urgent messages can be sent to your provider as well.   To learn more about what you can do with MyChart, go to [email or web address].    Your next appointment:   6 Months   The format for your next appointment:   In Person  Provider:   Jenkins Rouge, MD   FOLLOW UP WITH DR. Curt Bears 1ST AVAILABLE      Other Instructions  Echocardiogram An echocardiogram is a procedure that uses painless sound waves (ultrasound) to produce an image of the heart. Images from an echocardiogram can provide important information about:  Signs of coronary artery disease (CAD).  Aneurysm detection. An aneurysm is a weak or damaged part of an artery wall that bulges out from the normal force of blood pumping through the body.  Heart size and shape. Changes in the size or shape of the heart can be associated with certain conditions, including heart failure, aneurysm, and CAD.  Heart muscle function.  Heart valve function.  Signs of a past heart attack.  Fluid buildup around the heart.  Thickening of the heart muscle.  A tumor or infectious growth around the heart valves. Tell a health care provider about:  Any allergies you have.  All medicines you are taking, including vitamins, herbs, eye drops, creams, and over-the-counter medicines.  Any blood disorders you have.  Any surgeries you have had.  Any medical conditions you have.  Whether you are pregnant or may be pregnant.  What are the risks? Generally, this is a safe procedure. However, problems may occur, including:  Allergic reaction to dye (contrast) that may be used during the procedure. What happens before the procedure? No specific preparation is needed. You may eat and drink normally. What happens during the procedure?   An IV tube may be inserted into one of your veins.  You may  receive contrast through this tube. A contrast is an injection that improves the quality of the pictures from your heart.  A gel will be applied to your chest.  A wand-like tool (transducer) will be moved over your chest. The gel will help to transmit the sound waves from the transducer.  The sound waves will harmlessly bounce off of your heart to allow the heart images to be captured in real-time motion. The images will be recorded on a computer. The procedure may vary among health care providers and hospitals. What happens after the procedure?  You may return to your normal, everyday life, including diet, activities, and medicines, unless your health care provider tells you not to do that. Summary  An echocardiogram is a procedure that uses painless sound waves (ultrasound) to produce an image of the heart.  Images from an echocardiogram can provide important information about the size and shape of your heart, heart muscle function, heart valve function, and fluid buildup around your heart.  You do not need to do anything to prepare before this procedure. You may eat and drink normally.  After the echocardiogram is completed, you may return to your normal, everyday life, unless your health care provider tells you not to do that. This information is not intended to replace advice given to you by your health care provider. Make sure you discuss any questions you have with your health care provider. Document Revised: 01/18/2019 Document Reviewed: 10/30/2016 Elsevier Patient Education  [place].

## 2020-01-08 ENCOUNTER — Telehealth: Payer: Self-pay | Admitting: Cardiology

## 2020-01-08 NOTE — Telephone Encounter (Signed)
° °  Pt would like to speak with Dr. Theodosia Blender nurse regarding his request to release his records for his sleep study to Dr. Redmond Baseman. He asked to him call  Please call

## 2020-01-14 ENCOUNTER — Other Ambulatory Visit: Payer: Self-pay | Admitting: Cardiovascular Disease

## 2020-01-14 ENCOUNTER — Telehealth: Payer: Self-pay | Admitting: Family Medicine

## 2020-01-14 ENCOUNTER — Other Ambulatory Visit: Payer: Self-pay | Admitting: Family

## 2020-01-14 ENCOUNTER — Other Ambulatory Visit: Payer: Self-pay

## 2020-01-14 DIAGNOSIS — G8929 Other chronic pain: Secondary | ICD-10-CM

## 2020-01-14 DIAGNOSIS — G4700 Insomnia, unspecified: Secondary | ICD-10-CM

## 2020-01-14 NOTE — Telephone Encounter (Signed)
Order has been sent. Will contact patient.

## 2020-01-14 NOTE — Telephone Encounter (Signed)
Patient was referred to [facility] in November of 2020. Due to having COVID and wanting to wait to have both COVID vaccines he did not schedule at that time.  He is now ready to schedule to see them but they would need a new referral to do so.  Can you resend the referral please? Thank you!

## 2020-01-15 ENCOUNTER — Other Ambulatory Visit: Payer: Self-pay

## 2020-01-15 ENCOUNTER — Ambulatory Visit (HOSPITAL_COMMUNITY)
Admission: RE | Admit: 2020-01-15 | Discharge: 2020-01-15 | Disposition: A | Discharge status: Home / Self Care | Payer: PPO | Source: Ambulatory Visit | Attending: Cardiology | Admitting: Cardiology

## 2020-01-15 ENCOUNTER — Other Ambulatory Visit (HOSPITAL_COMMUNITY): Payer: Self-pay | Admitting: Cardiovascular Disease

## 2020-01-15 ENCOUNTER — Other Ambulatory Visit: Payer: Self-pay | Admitting: Internal Medicine

## 2020-01-15 DIAGNOSIS — I4819 Other persistent atrial fibrillation: Secondary | ICD-10-CM | POA: Diagnosis not present

## 2020-01-15 DIAGNOSIS — R0989 Other specified symptoms and signs involving the circulatory and respiratory systems: Secondary | ICD-10-CM

## 2020-01-15 DIAGNOSIS — I6523 Occlusion and stenosis of bilateral carotid arteries: Secondary | ICD-10-CM

## 2020-01-16 NOTE — Telephone Encounter (Signed)
Gabapentin 300 mg Checked controlled substance database. Last filled 10/15/2019  Last office visit 04/23/2019 Next Office visit N/S

## 2020-01-17 DIAGNOSIS — G8929 Other chronic pain: Secondary | ICD-10-CM | POA: Diagnosis not present

## 2020-01-17 DIAGNOSIS — M545 Low back pain: Secondary | ICD-10-CM | POA: Diagnosis not present

## 2020-01-18 NOTE — Telephone Encounter (Signed)
Called patient and informed him his records were faxed on 11/26/19 appointment made for 12/18/19.

## 2020-01-21 ENCOUNTER — Other Ambulatory Visit: Payer: Self-pay

## 2020-01-21 ENCOUNTER — Ambulatory Visit (HOSPITAL_COMMUNITY): Payer: PPO | Attending: Internal Medicine

## 2020-01-21 DIAGNOSIS — I4819 Other persistent atrial fibrillation: Secondary | ICD-10-CM | POA: Diagnosis not present

## 2020-01-21 MED ORDER — PERFLUTREN LIPID MICROSPHERE
1.0000 mL | INTRAVENOUS | Status: AC | PRN
  Administered 2020-01-21: 2 mL via INTRAVENOUS

## 2020-01-22 DIAGNOSIS — G8929 Other chronic pain: Secondary | ICD-10-CM | POA: Diagnosis not present

## 2020-01-22 DIAGNOSIS — M545 Low back pain: Secondary | ICD-10-CM | POA: Diagnosis not present

## 2020-01-25 DIAGNOSIS — G8929 Other chronic pain: Secondary | ICD-10-CM | POA: Diagnosis not present

## 2020-01-25 DIAGNOSIS — M545 Low back pain: Secondary | ICD-10-CM | POA: Diagnosis not present

## 2020-01-28 ENCOUNTER — Other Ambulatory Visit: Payer: Self-pay | Admitting: Cardiovascular Disease

## 2020-01-29 DIAGNOSIS — M545 Low back pain: Secondary | ICD-10-CM | POA: Diagnosis not present

## 2020-01-29 DIAGNOSIS — G8929 Other chronic pain: Secondary | ICD-10-CM | POA: Diagnosis not present

## 2020-02-01 DIAGNOSIS — G8929 Other chronic pain: Secondary | ICD-10-CM | POA: Diagnosis not present

## 2020-02-01 DIAGNOSIS — M545 Low back pain: Secondary | ICD-10-CM | POA: Diagnosis not present

## 2020-02-04 ENCOUNTER — Encounter: Payer: Self-pay | Admitting: Cardiology

## 2020-02-04 ENCOUNTER — Other Ambulatory Visit: Payer: Self-pay

## 2020-02-04 ENCOUNTER — Ambulatory Visit: Payer: PPO | Admitting: Cardiology

## 2020-02-04 VITALS — BP 110/62 | HR 59 | Ht 70.0 in | Wt 209.0 lb

## 2020-02-04 DIAGNOSIS — I4819 Other persistent atrial fibrillation: Secondary | ICD-10-CM

## 2020-02-04 NOTE — Progress Notes (Signed)
Electrophysiology Office Note   Date:  02/04/2020   ID:  [name] Brown, DOB 1951/05/16, MRN [id number]  PCP:  Hoyt Koch, MD  Cardiologist:  Johnsie Cancel Primary Electrophysiologist:  Jayel Scaduto Meredith Leeds, MD    No chief complaint on file.    History of Present Illness: [name] Brown is a 69 y.o. male who is being seen today for the evaluation of atrial fibrillation at the request of Hoyt Koch, *. Presenting today for electrophysiology evaluation.  He has a history of coronary artery disease status post three-vessel CABG, hypertension, hyperlipidemia, and atrial fibrillation.  He was found to be in atrial fibrillation incidentally on office visit 08/30/2017.  He was put on Eliquis at the time.  Beta-blockers were not started due to resting bradycardia.  January 2020 he was noted increasing dyspnea on exertion.  He had a cardioversion 11/28/2016.    Today, denies symptoms of palpitations, chest pain, shortness of breath, orthopnea, PND, lower extremity edema, claudication, dizziness, presyncope, syncope, bleeding, or neurologic sequela. The patient is tolerating medications without difficulties.  He has not noted any further episodes of atrial fibrillation.  Unfortunately he had a fall this past week.  He was coughing and choking after eating a meal.  He had an episode of syncope after that.  He hit his head, arm, knee and has quite a bit of bruising.  He has follow-up with his primary physician next for further discussion of this.  He is also being worked up by Dr. Redmond Baseman for his sleep apnea.  Past Medical History:  Diagnosis Date  . Aortic aneurysm ([place]) Endograf 2009  . Arthritis   . Atrial fibrillation ([place])   . Bradycardia   . CAD (coronary artery disease) CABG 03/2000   Median sternotomy for coronary artery bypass grafting x 3 (left  . HTN (hypertension)   . Hx of CABG 2001   severe 3 vessel disease  . Hx of colonic polyp 06/2010   Hyperplastic   .  Hyperlipidemia   . Myocardial infarction ([place])   . Sleep apnea    Past Surgical History:  Procedure Laterality Date  . CARDIOVERSION N/A 11/28/2018   Procedure: CARDIOVERSION;  Surgeon: Elouise Munroe, MD;  Location: [facility] ENDOSCOPY;  Service: Cardiovascular;  Laterality: N/A;  . COLONOSCOPY    . CORONARY ARTERY BYPASS GRAFT     AAA stent graft on 12/01/2007 at [facility]  coronary bypass in 2001. Dr. Roxy Manns.  Marland Kitchen FOOT SURGERY Left 07/19/2017  . LEFT HEART CATHETERIZATION WITH CORONARY ANGIOGRAM N/A 10/26/2013   Procedure: LEFT HEART CATHETERIZATION WITH CORONARY ANGIOGRAM;  Surgeon: Peter M Martinique, MD;  Location: [facility] CATH LAB;  Service: Cardiovascular;  Laterality: N/A;  . MASS EXCISION Right 08/10/2019   Procedure: EXCISION OF CYST FROM RIGHT SCALP;  Surgeon: Jovita Kussmaul, MD;  Location: [facility];  Service: General;  Laterality: Right;     Current Outpatient Medications  Medication Sig Dispense Refill  . amiodarone (PACERONE) 200 MG tablet Take 1 tablet (200 mg total) by mouth daily. TAKE 1 TABLET BY MOUTH TWICE DAILY FOR 1 MONTH, THEN REDUCE AND TAKE 1 TABLET ONCE DAILY 30 tablet 6  . apixaban (ELIQUIS) 5 MG TABS tablet Take 1 tablet (5 mg total) by mouth 2 (two) times daily. 180 tablet 1  . aspirin EC 81 MG EC tablet Take 1 tablet (81 mg total) by mouth daily.    . cholecalciferol (VITAMIN D) 25 MCG (1000 UT) tablet  Take 1,000 Units by mouth daily.     . diclofenac sodium (VOLTAREN) 1 % GEL Apply 2 g topically 4 (four) times daily. 100 g 3  . divalproex (DEPAKOTE ER) 500 MG 24 hr tablet TAKE 1 TABLET(500 MG) BY MOUTH DAILY 90 tablet 1  . Doxycycline Hyclate 50 MG TABS Take 50 mg by mouth daily.     Marland Kitchen ezetimibe (ZETIA) 10 MG tablet Take 1 tablet (10 mg total) by mouth daily. 90 tablet 3  . furosemide (LASIX) 20 MG tablet TAKE 1 TABLET(20 MG) BY MOUTH TWICE DAILY 180 tablet 1  . gabapentin (NEURONTIN) 300 MG capsule TAKE 1 CAPSULE(300 MG) BY MOUTH  TWICE DAILY 60 capsule 3  . Glucosamine-Chondroitin (GLUCOSAMINE CHONDR COMPLEX PO) Take 500 mg by mouth 2 (two) times daily.     . hydrALAZINE (APRESOLINE) 50 MG tablet TAKE 1 TABLET(50 MG) BY MOUTH TWICE DAILY 180 tablet 3  . HYDROcodone-acetaminophen (NORCO/VICODIN) 5-325 MG tablet Take 1-2 tablets by mouth every 6 (six) hours as needed for moderate pain or severe pain. 10 tablet 0  . isosorbide mononitrate (IMDUR) 30 MG 24 hr tablet TAKE 1 TABLET BY MOUTH EVERY DAY 90 tablet 2  . montelukast (SINGULAIR) 10 MG tablet Take 1 tablet (10 mg total) by mouth at bedtime. 30 tablet 3  . Multiple Vitamin (MULTIVITAMIN) capsule Take 1 capsule by mouth daily.    . niacin (NIASPAN) 1000 MG CR tablet Take 1,000 mg by mouth 2 (two) times daily.      . nitroGLYCERIN (NITROSTAT) 0.4 MG SL tablet DISSOLVE 1 TABLET UNDER THE TONGUE EVERY 5 MINUTES FOR 3 DOSES AS NEEDED FOR CHEST PAIN 25 tablet 6  . Omega-3 Fatty Acids (FISH OIL) 1200 MG CAPS Take 1,200 mg by mouth 2 (two) times daily.    . potassium chloride SA (KLOR-CON) 20 MEQ tablet TAKE 1 TABLET BY MOUTH EVERY DAY 90 tablet 3  . predniSONE (DELTASONE) 20 MG tablet Take 2 tablets (40 mg total) by mouth daily with breakfast. 10 tablet 0  . ramipril (ALTACE) 10 MG capsule TAKE 1 CAPSULE BY MOUTH TWICE DAILY 180 capsule 3  . tamsulosin (FLOMAX) 0.4 MG CAPS capsule TAKE 1 CAPSULE(0.4 MG) BY MOUTH DAILY 90 capsule 0  . tiZANidine (ZANAFLEX) 4 MG tablet Take 1 tablet (4 mg total) by mouth every 8 (eight) hours as needed for muscle spasms. 270 tablet 3  . traZODone (DESYREL) 100 MG tablet TAKE 1 TABLET(100 MG) BY MOUTH AT BEDTIME AS NEEDED FOR SLEEP 90 tablet 1  . vitamin C (ASCORBIC ACID) 500 MG tablet Take 500 mg by mouth daily.     No current facility-administered medications for this visit.    Allergies:   Other and Statins   Social History:  The patient  reports that he quit smoking about 28 years ago. His smoking use included cigarettes. He has a 60.00  pack-year smoking history. He has never used smokeless tobacco. He reports that he does not drink alcohol or use drugs.   Family History:  The patient's family history includes Heart attack in his father and mother; Heart disease in his father and mother.   ROS:  Please see the history of present illness.   Otherwise, review of systems is positive for none.   All other systems are reviewed and negative.   PHYSICAL EXAM: VS:  BP 110/62   Pulse (!) 59   Ht 5\' 10"  (1.778 m)   Wt 209 lb (94.8 kg)   SpO2 95%  BMI 29.99 kg/m  , BMI Body mass index is 29.99 kg/m. GEN: Well nourished, well developed, in no acute distress  HEENT: normal  Neck: no JVD, carotid bruits, or masses Cardiac: RRR; no murmurs, rubs, or gallops,no edema  Respiratory:  clear to auscultation bilaterally, normal work of breathing GI: soft, nontender, nondistended, + BS MS: no deformity or atrophy  Skin: warm and dry Neuro:  Strength and sensation are intact Psych: euthymic mood, full affect  EKG:  EKG is ordered today. Personal review of the ekg ordered shows sinus rhythm, rate 59  Recent Labs: 03/16/2019: ALT 16; BUN 32; Creatinine, Ser 1.36; Hemoglobin 12.8; Platelets 187.0; Potassium 5.0; Pro B Natriuretic peptide (BNP) 287.0; Sodium 138 07/11/2019: TSH 0.867    Lipid Panel     Component Value Date/Time   CHOL 114 11/29/2018 0223   TRIG 32 11/29/2018 0223   HDL 24 (L) 11/29/2018 0223   CHOLHDL 4.8 11/29/2018 0223   VLDL 6 11/29/2018 0223   LDLCALC 84 11/29/2018 0223     Wt Readings from Last 3 Encounters:  02/04/20 209 lb (94.8 kg)  01/04/20 216 lb (98 kg)  11/13/19 206 lb (93.4 kg)      Other studies Reviewed: Additional studies/ records that were reviewed today include: TTE 01/21/20 Review of the above records today demonstrates:  1. Left ventricular ejection fraction, by estimation, is 55 to 60%. The  left ventricle has normal function. Left ventricular endocardial border  not optimally  defined to evaluate regional wall motion. There is mild left  ventricular hypertrophy. Left  ventricular diastolic parameters are consistent with Grade II diastolic  dysfunction (pseudonormalization).  2. Right ventricular systolic function is normal. The right ventricular  size is mildly enlarged. There is mildly elevated pulmonary artery  systolic pressure.  3. Left atrial size was moderately dilated.  4. Right atrial size was mildly dilated.  5. The mitral valve is normal in structure. Mild mitral valve  regurgitation.  6. The aortic valve is tricuspid. Aortic valve regurgitation is mild.  Mild aortic valve sclerosis is present, with no evidence of aortic valve  stenosis.  7. The inferior vena cava is normal in size with greater than 50%  respiratory variability, suggesting right atrial pressure of 3 mmHg.   SPECT 11/08/18  The left ventricular ejection fraction is normal (55-65%).  Nuclear stress EF: 56%. Post CABG septal wall hypokinesis.  There was no ST segment deviation noted during stress.  There is mildly reduced radiotracer uptake at both rest and stress in the anterior, lateral and inferolateral distributions. No ischemia identified. (POST CABG)  This is a low risk study.  ASSESSMENT AND PLAN:  1.  Persistent atrial fibrillation: Currently on Eliquis and amiodarone.  CHA2DS2-VASc of 3.  He had a repeat echo that showed positive remodeling of his left atrium.  I do think he would benefit from ablation.  Risks and benefits were discussed which were bleeding, tamponade, heart block, stroke, damage to chest organs.  He understands these risks and would like to discuss it further with his wife.  He Kamil Mchaffie call us back with an answer.   2.  Coronary artery disease: Status post CABG with occluded radial to the OM.  No current chest pain.  3.  Hypertension: Currently well controlled  4.  Hyperlipidemia: Continue statin per primary cardiology  5.  Obstructive sleep  apnea: Has not tolerated CPAP mask.  Is working with Dr. Redmond Baseman to see if there are any further options for therapy.  Current medicines are reviewed at length with the patient today.   The patient does not have concerns regarding his medicines.  The following changes were made today: None  Labs/ tests ordered today include:  Orders Placed This Encounter  Procedures  . EKG 12-Lead    Disposition:   FU with Ady Heimann 6 months  Signed, Danita Proud Meredith Leeds, MD  02/04/2020 9:07 AM     [facility] [address] [place] [address] [phone] (office) [phone] (fax)

## 2020-02-04 NOTE — Patient Instructions (Signed)
Medication Instructions:  Your physician recommends that you continue on your current medications as directed. Please refer to the Current Medication list given to you today.  *If you need a refill on your cardiac medications before your next appointment, please call your pharmacy*   Lab Work: None ordered If you have labs (blood work) drawn today and your tests are completely normal, you will receive your results only by: Marland Kitchen MyChart Message (if you have MyChart) OR . A paper copy in the mail If you have any lab test that is abnormal or we need to change your treatment, we will call you to review the results.   Testing/Procedures: None ordered   Follow-Up: At [facility], you and your health needs are our priority.  As part of our continuing mission to provide you with exceptional heart care, we have created designated Provider Care Teams.  These Care Teams include your primary Cardiologist (physician) and Advanced Practice Providers (APPs -  Physician Assistants and Nurse Practitioners) who all work together to provide you with the care you need, when you need it.  We recommend signing up for the patient portal called "MyChart".  Sign up information is provided on this After Visit Summary.  MyChart is used to connect with patients for Virtual Visits (Telemedicine).  Patients are able to view lab/test results, encounter notes, upcoming appointments, etc.  Non-urgent messages can be sent to your provider as well.   To learn more about what you can do with MyChart, go to [email or web address].    Your next appointment:   6 month(s)  The format for your next appointment:   In Person  Provider:   Allegra Lai, MD   Thank you for choosing [facility]!!   Trinidad Curet, RN [phone]    Other Instructions  Please call the office if you would like to proceed/schedule an ablation. Otherwise you will follow up later this year.    Cardiac Ablation Cardiac ablation is  a procedure to disable (ablate) a small amount of heart tissue in very specific places. The heart has many electrical connections. Sometimes these connections are abnormal and can cause the heart to beat very fast or irregularly. Ablating some of the problem areas can improve the heart rhythm or return it to normal. Ablation may be done for people who:  Have Wolff-Parkinson-White syndrome.  Have fast heart rhythms (tachycardia).  Have taken medicines for an abnormal heart rhythm (arrhythmia) that were not effective or caused side effects.  Have a high-risk heartbeat that may be life-threatening. During the procedure, a small incision is made in the neck or the groin, and a long, thin, flexible tube (catheter) is inserted into the incision and moved to the heart. Small devices (electrodes) on the tip of the catheter will send out electrical currents. A type of X-ray (fluoroscopy) will be used to help guide the catheter and to provide images of the heart. Tell a health care provider about:  Any allergies you have.  All medicines you are taking, including vitamins, herbs, eye drops, creams, and over-the-counter medicines.  Any problems you or family members have had with anesthetic medicines.  Any blood disorders you have.  Any surgeries you have had.  Any medical conditions you have, such as kidney failure.  Whether you are pregnant or may be pregnant. What are the risks? Generally, this is a safe procedure. However, problems may occur, including:  Infection.  Bruising and bleeding at the catheter insertion site.  Bleeding into the chest,  especially into the sac that surrounds the heart. This is a serious complication.  Stroke or blood clots.  Damage to other structures or organs.  Allergic reaction to medicines or dyes.  Need for a permanent pacemaker if the normal electrical system is damaged. A pacemaker is a small computer that sends electrical signals to the heart and helps  your heart beat normally.  The procedure not being fully effective. This may not be recognized until months later. Repeat ablation procedures are sometimes required. What happens before the procedure?  Follow instructions from your health care provider about eating or drinking restrictions.  Ask your health care provider about: ? Changing or stopping your regular medicines. This is especially important if you are taking diabetes medicines or blood thinners. ? Taking medicines such as aspirin and ibuprofen. These medicines can thin your blood. Do not take these medicines before your procedure if your health care provider instructs you not to.  Plan to have someone take you home from the hospital or clinic.  If you will be going home right after the procedure, plan to have someone with you for 24 hours. What happens during the procedure?  To lower your risk of infection: ? Your health care team will wash or sanitize their hands. ? Your skin will be washed with soap. ? Hair may be removed from the incision area.  An IV tube will be inserted into one of your veins.  You will be given a medicine to help you relax (sedative).  The skin on your neck or groin will be numbed.  An incision will be made in your neck or your groin.  A needle will be inserted through the incision and into a large vein in your neck or groin.  A catheter will be inserted into the needle and moved to your heart.  Dye may be injected through the catheter to help your surgeon see the area of the heart that needs treatment.  Electrical currents will be sent from the catheter to ablate heart tissue in desired areas. There are three types of energy that may be used to ablate heart tissue: ? Heat (radiofrequency energy). ? Laser energy. ? Extreme cold (cryoablation).  When the necessary tissue has been ablated, the catheter will be removed.  Pressure will be held on the catheter insertion area to prevent  excessive bleeding.  A bandage (dressing) will be placed over the catheter insertion area. The procedure may vary among health care providers and hospitals. What happens after the procedure?  Your blood pressure, heart rate, breathing rate, and blood oxygen level will be monitored until the medicines you were given have worn off.  Your catheter insertion area will be monitored for bleeding. You will need to lie still for a few hours to ensure that you do not bleed from the catheter insertion area.  Do not drive for 24 hours or as long as directed by your health care provider. Summary  Cardiac ablation is a procedure to disable (ablate) a small amount of heart tissue in very specific places. Ablating some of the problem areas can improve the heart rhythm or return it to normal.  During the procedure, electrical currents will be sent from the catheter to ablate heart tissue in desired areas. This information is not intended to replace advice given to you by your health care provider. Make sure you discuss any questions you have with your health care provider. Document Revised: 03/20/2018 Document Reviewed: 08/16/2016 Elsevier Patient Education  2020  [employer].

## 2020-02-05 DIAGNOSIS — M545 Low back pain: Secondary | ICD-10-CM | POA: Diagnosis not present

## 2020-02-05 DIAGNOSIS — G8929 Other chronic pain: Secondary | ICD-10-CM | POA: Diagnosis not present

## 2020-02-06 ENCOUNTER — Ambulatory Visit (INDEPENDENT_AMBULATORY_CARE_PROVIDER_SITE_OTHER): Payer: PPO | Admitting: Internal Medicine

## 2020-02-06 ENCOUNTER — Encounter: Payer: Self-pay | Admitting: Internal Medicine

## 2020-02-06 ENCOUNTER — Other Ambulatory Visit: Payer: Self-pay

## 2020-02-06 VITALS — BP 138/60 | HR 52 | Temp 98.0°F | Ht 70.0 in | Wt 208.0 lb

## 2020-02-06 DIAGNOSIS — G4700 Insomnia, unspecified: Secondary | ICD-10-CM | POA: Diagnosis not present

## 2020-02-06 DIAGNOSIS — T17308S Unspecified foreign body in larynx causing other injury, sequela: Secondary | ICD-10-CM

## 2020-02-06 DIAGNOSIS — Z23 Encounter for immunization: Secondary | ICD-10-CM | POA: Diagnosis not present

## 2020-02-06 DIAGNOSIS — T17308A Unspecified foreign body in larynx causing other injury, initial encounter: Secondary | ICD-10-CM | POA: Insufficient documentation

## 2020-02-06 DIAGNOSIS — G4733 Obstructive sleep apnea (adult) (pediatric): Secondary | ICD-10-CM | POA: Diagnosis not present

## 2020-02-06 MED ORDER — LEVOCETIRIZINE DIHYDROCHLORIDE 5 MG PO TABS: 5.0000 mg | ORAL_TABLET | Freq: Every evening | ORAL | 3 refills | Status: DC

## 2020-02-06 MED ORDER — ZOLPIDEM TARTRATE ER 12.5 MG PO TBCR: 12.5000 mg | EXTENDED_RELEASE_TABLET | Freq: Every evening | ORAL | 0 refills | Status: DC | PRN

## 2020-02-06 NOTE — Assessment & Plan Note (Signed)
Severe and suspect this is causing the daytime sleepiness. He is pursing inspire and this will be a long process likely.

## 2020-02-06 NOTE — Assessment & Plan Note (Signed)
Suspect this is related to post nasal drip. Rx xyzal to see if this helps. Previously he had been on singulair which was able to eliminate symptoms but he got itching sensation in his throat so stopped. Previously flonase did not help. He had a bad episode where air/saliva got into throat while watching tv and fell due to lack of air with choking sensation. He was not eating or drinking at the time. May need swallowing evaluation if symptoms not improved with control of post nasal drip.

## 2020-02-06 NOTE — Addendum Note (Signed)
Addended by: Otilio Miu on: 02/06/2020 11:52 AM   Modules accepted: Orders

## 2020-02-06 NOTE — Progress Notes (Addendum)
   Subjective:   Patient ID: [name] Brown, male    DOB: 1951-01-29, 69 y.o.   MRN: [id number]  HPI The patient is a 69 YO male coming in for choking (at night time which is preventing him from sleeping, last summer similar problems due to post nasal drip, flonase failed, singulair was able to eliminate symptoms, he stopped this due to some itching in his throat), and sleeping (seen back in January for sleep issues and his trazodone was increased, having a lot of problems with OSA and getting inspire device, this is going to be a long process and he is very sleepy daytime, getting to sleep with trazodone but waking up after 1-2 hours), and sleep apnea (unable to tolerate CPAP, was supposed to try bipap titration but he cannot wear mask, seeing ENT for consideration of inspire device, this will be a long process).   Review of Systems  Constitutional: Positive for appetite change.  HENT: Positive for congestion, postnasal drip and trouble swallowing.   Eyes: Negative.   Respiratory: Positive for choking. Negative for cough, chest tightness and shortness of breath.   Cardiovascular: Negative for chest pain, palpitations and leg swelling.  Gastrointestinal: Negative for abdominal distention, abdominal pain, constipation, diarrhea, nausea and vomiting.  Musculoskeletal: Negative.   Skin: Negative.   Neurological: Negative.   Psychiatric/Behavioral: Positive for decreased concentration and sleep disturbance.    Objective:  Physical Exam Constitutional:      Appearance: He is well-developed.  HENT:     Head: Normocephalic and atraumatic.     Nose:     Comments: Oropharynx with clear drainage, no swelling  Cardiovascular:     Rate and Rhythm: Normal rate and regular rhythm.  Pulmonary:     Effort: Pulmonary effort is normal. No respiratory distress.     Breath sounds: Normal breath sounds. No wheezing or rales.  Abdominal:     General: Bowel sounds are normal. There is no distension.   Palpations: Abdomen is soft.     Tenderness: There is no abdominal tenderness. There is no rebound.  Musculoskeletal:     Cervical back: Normal range of motion.  Skin:    General: Skin is warm and dry.  Neurological:     Mental Status: He is alert and oriented to person, place, and time.     Coordination: Coordination normal.     Vitals:   02/06/20 0824  BP: 138/60  Pulse: (!) 52  Temp: 98 F (36.7 C)  SpO2: 99%  Weight: 208 lb (94.3 kg)  Height: 5\' 10"  (1.778 m)    This visit occurred during the SARS-CoV-2 public health emergency.  Safety protocols were in place, including screening questions prior to the visit, additional usage of staff PPE, and extensive cleaning of exam room while observing appropriate contact time as indicated for disinfecting solutions.   Assessment & Plan:  Pneumonia 23 given at visit

## 2020-02-06 NOTE — Assessment & Plan Note (Signed)
Rx ambien cr 12.5 mg qhs today to replace trazodone 100 mg nightly. This was helping him fall asleep but not stay asleep. He did try increase to 150 mg nightly and this made him groggy during the day and did not help more with sleep.

## 2020-02-06 NOTE — Patient Instructions (Signed)
We have sent in levocetirizine to take daily for the drainage to see if this helps with choking.   We can try a different sleep medicine called ambien to use instead of trazodone.

## 2020-02-08 ENCOUNTER — Other Ambulatory Visit: Payer: Self-pay | Admitting: Otolaryngology

## 2020-02-08 ENCOUNTER — Telehealth: Payer: Self-pay | Admitting: Cardiology

## 2020-02-08 DIAGNOSIS — I4891 Unspecified atrial fibrillation: Secondary | ICD-10-CM

## 2020-02-08 DIAGNOSIS — M545 Low back pain: Secondary | ICD-10-CM | POA: Diagnosis not present

## 2020-02-08 DIAGNOSIS — G8929 Other chronic pain: Secondary | ICD-10-CM | POA: Diagnosis not present

## 2020-02-08 NOTE — Progress Notes (Signed)
Chart and most recent cardiac note reviewed with Dr. Sabra Heck. Ok to proceed with procedure as scheduled.

## 2020-02-08 NOTE — Telephone Encounter (Signed)
AFib ablation date held for 7/2. Pt aware I will let him know if sooner date becomes available. Aware I will follow up at later date to go over instructions. Patient verbalized understanding and agreeable to plan.

## 2020-02-08 NOTE — Telephone Encounter (Signed)
   Pt would like to speak with Sherri, he said he is reay to schedule his cardiac ablation procedure

## 2020-02-11 ENCOUNTER — Encounter (HOSPITAL_BASED_OUTPATIENT_CLINIC_OR_DEPARTMENT_OTHER): Payer: Self-pay | Admitting: Otolaryngology

## 2020-02-11 ENCOUNTER — Telehealth: Payer: Self-pay | Admitting: Internal Medicine

## 2020-02-11 ENCOUNTER — Other Ambulatory Visit: Payer: Self-pay

## 2020-02-11 NOTE — Telephone Encounter (Signed)
Since I do not know his history I recommend that he schedule an appointment with his PCP when she returns to the office.

## 2020-02-11 NOTE — Telephone Encounter (Signed)
New Message:    Pt is calling and states that zolpidem (AMBIEN CR) 12.5 MG CR tablet is helping him sleep much better but the next day he is just in a fog. Pt states he would like to try something else. Please advise.

## 2020-02-11 NOTE — Telephone Encounter (Signed)
Please advise 

## 2020-02-12 DIAGNOSIS — M545 Low back pain: Secondary | ICD-10-CM | POA: Diagnosis not present

## 2020-02-12 DIAGNOSIS — G8929 Other chronic pain: Secondary | ICD-10-CM | POA: Diagnosis not present

## 2020-02-12 NOTE — Telephone Encounter (Signed)
Called and spoke with patient,offered patient an appointment to see doctor because of his morning brain fog, patient refused he stated that he is going to start taking 1/2 of the Ambien at night to see if it would help with his brain fog.         Patient said since his last fall he has been taking between 6 to 8 Tylenol a day to help with pain.  Patient wants to know can he be prescribed some type of medicine to help with the pain. Patient is aware that provider is out of office

## 2020-02-14 ENCOUNTER — Encounter (HOSPITAL_BASED_OUTPATIENT_CLINIC_OR_DEPARTMENT_OTHER)
Admission: RE | Admit: 2020-02-14 | Discharge: 2020-02-14 | Disposition: A | Discharge status: Home / Self Care | Payer: PPO | Source: Ambulatory Visit | Attending: Otolaryngology | Admitting: Otolaryngology

## 2020-02-14 ENCOUNTER — Other Ambulatory Visit (HOSPITAL_COMMUNITY)
Admission: RE | Admit: 2020-02-14 | Discharge: 2020-02-14 | Disposition: A | Discharge status: Home / Self Care | Payer: PPO | Source: Ambulatory Visit | Attending: Otolaryngology | Admitting: Otolaryngology

## 2020-02-14 DIAGNOSIS — Z01812 Encounter for preprocedural laboratory examination: Secondary | ICD-10-CM | POA: Insufficient documentation

## 2020-02-14 DIAGNOSIS — Z20822 Contact with and (suspected) exposure to covid-19: Secondary | ICD-10-CM | POA: Diagnosis not present

## 2020-02-14 LAB — BASIC METABOLIC PANEL
Anion gap: 7 (ref 5–15)
BUN: 33 mg/dL — ABNORMAL HIGH (ref 8–23)
CO2: 27 mmol/L (ref 22–32)
Calcium: 9.4 mg/dL (ref 8.9–10.3)
Chloride: 106 mmol/L (ref 98–111)
Creatinine, Ser: 1.5 mg/dL — ABNORMAL HIGH (ref 0.61–1.24)
GFR calc Af Amer: 55 mL/min — ABNORMAL LOW (ref >60)
GFR calc non Af Amer: 47 mL/min — ABNORMAL LOW (ref >60)
Glucose, Bld: 112 mg/dL — ABNORMAL HIGH (ref 70–99)
Potassium: 4.7 mmol/L (ref 3.5–5.1)
Sodium: 140 mmol/L (ref 135–145)

## 2020-02-14 LAB — SARS CORONAVIRUS 2 (TAT 6-24 HRS): SARS Coronavirus 2: NEGATIVE

## 2020-02-14 NOTE — Telephone Encounter (Signed)
Tylenol is safest medication for him. Max dose daily is 3000 mg daily. We can get him in with sports medicine again if he wants to see if there is something we can do to help him heal faster.

## 2020-02-14 NOTE — Telephone Encounter (Signed)
If tylenol is working this is safest for pain. Would recommend to stop ambien if making drowsy the next day.

## 2020-02-15 NOTE — Telephone Encounter (Signed)
Spoke with patient and info given 

## 2020-02-18 ENCOUNTER — Ambulatory Visit (HOSPITAL_BASED_OUTPATIENT_CLINIC_OR_DEPARTMENT_OTHER): Payer: PPO | Admitting: Certified Registered"

## 2020-02-18 ENCOUNTER — Encounter (HOSPITAL_BASED_OUTPATIENT_CLINIC_OR_DEPARTMENT_OTHER): Payer: Self-pay | Admitting: Otolaryngology

## 2020-02-18 ENCOUNTER — Ambulatory Visit (HOSPITAL_BASED_OUTPATIENT_CLINIC_OR_DEPARTMENT_OTHER)
Admission: RE | Admit: 2020-02-18 | Discharge: 2020-02-18 | Disposition: A | Discharge status: Home / Self Care | Payer: PPO | Attending: Otolaryngology | Admitting: Otolaryngology

## 2020-02-18 ENCOUNTER — Encounter (HOSPITAL_BASED_OUTPATIENT_CLINIC_OR_DEPARTMENT_OTHER)
Admission: RE | Disposition: A | Discharge status: Home / Self Care | Payer: Self-pay | Source: Home / Self Care | Attending: Otolaryngology

## 2020-02-18 DIAGNOSIS — I080 Rheumatic disorders of both mitral and aortic valves: Secondary | ICD-10-CM | POA: Diagnosis not present

## 2020-02-18 DIAGNOSIS — E785 Hyperlipidemia, unspecified: Secondary | ICD-10-CM | POA: Insufficient documentation

## 2020-02-18 DIAGNOSIS — Z951 Presence of aortocoronary bypass graft: Secondary | ICD-10-CM | POA: Diagnosis not present

## 2020-02-18 DIAGNOSIS — G4733 Obstructive sleep apnea (adult) (pediatric): Secondary | ICD-10-CM | POA: Insufficient documentation

## 2020-02-18 DIAGNOSIS — I2510 Atherosclerotic heart disease of native coronary artery without angina pectoris: Secondary | ICD-10-CM | POA: Insufficient documentation

## 2020-02-18 DIAGNOSIS — I4819 Other persistent atrial fibrillation: Secondary | ICD-10-CM | POA: Diagnosis not present

## 2020-02-18 DIAGNOSIS — M1990 Unspecified osteoarthritis, unspecified site: Secondary | ICD-10-CM | POA: Diagnosis not present

## 2020-02-18 DIAGNOSIS — Z7982 Long term (current) use of aspirin: Secondary | ICD-10-CM | POA: Diagnosis not present

## 2020-02-18 DIAGNOSIS — Z7901 Long term (current) use of anticoagulants: Secondary | ICD-10-CM | POA: Diagnosis not present

## 2020-02-18 DIAGNOSIS — I4891 Unspecified atrial fibrillation: Secondary | ICD-10-CM | POA: Diagnosis not present

## 2020-02-18 DIAGNOSIS — Z79899 Other long term (current) drug therapy: Secondary | ICD-10-CM | POA: Insufficient documentation

## 2020-02-18 DIAGNOSIS — I10 Essential (primary) hypertension: Secondary | ICD-10-CM | POA: Insufficient documentation

## 2020-02-18 DIAGNOSIS — I252 Old myocardial infarction: Secondary | ICD-10-CM | POA: Diagnosis not present

## 2020-02-18 DIAGNOSIS — Z9989 Dependence on other enabling machines and devices: Secondary | ICD-10-CM | POA: Diagnosis not present

## 2020-02-18 DIAGNOSIS — Z87891 Personal history of nicotine dependence: Secondary | ICD-10-CM | POA: Insufficient documentation

## 2020-02-18 HISTORY — PX: DRUG INDUCED ENDOSCOPY: SHX6808

## 2020-02-18 SURGERY — DRUG INDUCED SLEEP ENDOSCOPY
Anesthesia: General | Site: Nose | Laterality: Right

## 2020-02-18 MED ORDER — IBUPROFEN 200 MG PO TABS: 200.0000 mg | ORAL_TABLET | Freq: Four times a day (QID) | ORAL | Status: DC | PRN

## 2020-02-18 MED ORDER — PROPOFOL 500 MG/50ML IV EMUL
INTRAVENOUS | Status: DC | PRN
  Administered 2020-02-18: 35 ug/kg/min via INTRAVENOUS

## 2020-02-18 MED ORDER — LIDOCAINE 2% (20 MG/ML) 5 ML SYRINGE
INTRAMUSCULAR | Status: DC | PRN
  Administered 2020-02-18: 30 mg via INTRAVENOUS

## 2020-02-18 MED ORDER — OXYCODONE HCL 5 MG PO TABS: 5.0000 mg | ORAL_TABLET | Freq: Once | ORAL | Status: DC | PRN

## 2020-02-18 MED ORDER — ONDANSETRON HCL 4 MG/2ML IJ SOLN
INTRAMUSCULAR | Status: DC | PRN
  Administered 2020-02-18: 4 mg via INTRAVENOUS

## 2020-02-18 MED ORDER — MEPERIDINE HCL 25 MG/ML IJ SOLN: 6.2500 mg | INTRAMUSCULAR | Status: DC | PRN

## 2020-02-18 MED ORDER — OXYMETAZOLINE HCL 0.05 % NA SOLN
NASAL | Status: DC | PRN
  Administered 2020-02-18: 1 via TOPICAL

## 2020-02-18 MED ORDER — OXYCODONE HCL 5 MG/5ML PO SOLN: 5.0000 mg | Freq: Once | ORAL | Status: DC | PRN

## 2020-02-18 MED ORDER — IBUPROFEN 100 MG/5ML PO SUSP: 200.0000 mg | Freq: Four times a day (QID) | ORAL | Status: DC | PRN

## 2020-02-18 MED ORDER — ONDANSETRON HCL 4 MG/2ML IJ SOLN: 4.0000 mg | Freq: Once | INTRAMUSCULAR | Status: DC | PRN

## 2020-02-18 SURGICAL SUPPLY — 15 items
CANISTER SUCT 1200ML W/VALVE (MISCELLANEOUS) ×3 IMPLANT
COVER WAND RF STERILE (DRAPES) IMPLANT
GLOVE BIO SURGEON STRL SZ7.5 (GLOVE) ×3 IMPLANT
KIT CLEAN ENDO (MISCELLANEOUS) ×3 IMPLANT
NDL PRECISIONGLIDE 27X1.5 (NEEDLE) IMPLANT
NEEDLE PRECISIONGLIDE 27X1.5 (NEEDLE) IMPLANT
PATTIES SURGICAL .5 X3 (DISPOSABLE) ×3 IMPLANT
SET BASIN DAY SURGERY F.S. (CUSTOM PROCEDURE TRAY) ×3 IMPLANT
SHEET MEDIUM DRAPE 40X70 STRL (DRAPES) IMPLANT
SOL ANTI FOG 6CC (MISCELLANEOUS) ×1 IMPLANT
SOLUTION ANTI FOG 6CC (MISCELLANEOUS) ×2
SYR CONTROL 10ML LL (SYRINGE) IMPLANT
TOWEL GREEN STERILE FF (TOWEL DISPOSABLE) ×3 IMPLANT
TUBE CONNECTING 20'X1/4 (TUBING) ×1
TUBE CONNECTING 20X1/4 (TUBING) ×2 IMPLANT

## 2020-02-18 NOTE — H&P (Signed)
[name] Brown is an 69 y.o. male.   Chief Complaint: sleep apnea HPI: 69 year old male with obstructive sleep apnea who is having difficulty tolerating CPAP.  He presents for sleep endoscopy.  Past Medical History:  Diagnosis Date  . Aortic aneurysm ([place]) Endograf 2009  . Arthritis   . Atrial fibrillation ([place])   . Bradycardia   . CAD (coronary artery disease) CABG 03/2000   Median sternotomy for coronary artery bypass grafting x 3 (left  . HTN (hypertension)   . Hx of CABG 2001   severe 3 vessel disease  . Hx of colonic polyp 06/2010   Hyperplastic   . Hyperlipidemia   . Myocardial infarction ([place])   . Sleep apnea     Past Surgical History:  Procedure Laterality Date  . CARDIOVERSION N/A 11/28/2018   Procedure: CARDIOVERSION;  Surgeon: Elouise Munroe, MD;  Location: [facility] ENDOSCOPY;  Service: Cardiovascular;  Laterality: N/A;  . COLONOSCOPY    . CORONARY ARTERY BYPASS GRAFT     AAA stent graft on 12/01/2007 at [facility]  coronary bypass in 2001. Dr. Roxy Manns.  Marland Kitchen FOOT SURGERY Left 07/19/2017  . LEFT HEART CATHETERIZATION WITH CORONARY ANGIOGRAM N/A 10/26/2013   Procedure: LEFT HEART CATHETERIZATION WITH CORONARY ANGIOGRAM;  Surgeon: Peter M Martinique, MD;  Location[facility] CATH LAB;  Service: Cardiovascular;  Laterality: N/A;  . MASS EXCISION Right 08/10/2019   Procedure: EXCISION OF CYST FROM RIGHT SCALP;  Surgeon: Jovita Kussmaul, MD;  Location: [place];  Service: General;  Laterality: Right;    Family History  Problem Relation Age of Onset  . Heart attack Mother   . Heart disease Mother   . Heart attack Father   . Heart disease Father   . Rectal cancer Neg Hx   . Stomach cancer Neg Hx   . Pancreatic cancer Neg Hx   . Esophageal cancer Neg Hx   . Colon polyps Neg Hx   . Colon cancer Neg Hx    Social History:  reports that he quit smoking about 28 years ago. His smoking use included cigarettes. He has a 60.00 pack-year smoking history. He  has never used smokeless tobacco. He reports that he does not drink alcohol or use drugs.  Allergies:  Allergies  Allergen Reactions  . Other Other (See Comments)    Myalgias with elevated CPKs  . Statins     Myalgias with elevated CPKs    Medications Prior to Admission  Medication Sig Dispense Refill  . amiodarone (PACERONE) 200 MG tablet Take 1 tablet (200 mg total) by mouth daily. TAKE 1 TABLET BY MOUTH TWICE DAILY FOR 1 MONTH, THEN REDUCE AND TAKE 1 TABLET ONCE DAILY 30 tablet 6  . apixaban (ELIQUIS) 5 MG TABS tablet Take 1 tablet (5 mg total) by mouth 2 (two) times daily. 180 tablet 1  . aspirin EC 81 MG EC tablet Take 1 tablet (81 mg total) by mouth daily.    . cholecalciferol (VITAMIN D) 25 MCG (1000 UT) tablet Take 1,000 Units by mouth daily.     . diclofenac sodium (VOLTAREN) 1 % GEL Apply 2 g topically 4 (four) times daily. 100 g 3  . divalproex (DEPAKOTE ER) 500 MG 24 hr tablet TAKE 1 TABLET(500 MG) BY MOUTH DAILY 90 tablet 1  . Doxycycline Hyclate 50 MG TABS Take 50 mg by mouth daily.     Marland Kitchen ezetimibe (ZETIA) 10 MG tablet Take 1 tablet (10 mg total) by  mouth daily. 90 tablet 3  . furosemide (LASIX) 20 MG tablet TAKE 1 TABLET(20 MG) BY MOUTH TWICE DAILY 180 tablet 1  . gabapentin (NEURONTIN) 300 MG capsule TAKE 1 CAPSULE(300 MG) BY MOUTH TWICE DAILY 60 capsule 3  . Glucosamine-Chondroitin (GLUCOSAMINE CHONDR COMPLEX PO) Take 500 mg by mouth 2 (two) times daily.     . hydrALAZINE (APRESOLINE) 50 MG tablet TAKE 1 TABLET(50 MG) BY MOUTH TWICE DAILY 180 tablet 3  . isosorbide mononitrate (IMDUR) 30 MG 24 hr tablet TAKE 1 TABLET BY MOUTH EVERY DAY 90 tablet 2  . levocetirizine (XYZAL) 5 MG tablet Take 1 tablet (5 mg total) by mouth every evening. 90 tablet 3  . montelukast (SINGULAIR) 10 MG tablet Take 1 tablet (10 mg total) by mouth at bedtime. 30 tablet 3  . Multiple Vitamin (MULTIVITAMIN) capsule Take 1 capsule by mouth daily.    . niacin (NIASPAN) 1000 MG CR tablet Take 1,000 mg  by mouth 2 (two) times daily.      . Omega-3 Fatty Acids (FISH OIL) 1200 MG CAPS Take 1,200 mg by mouth 2 (two) times daily.    . potassium chloride SA (KLOR-CON) 20 MEQ tablet TAKE 1 TABLET BY MOUTH EVERY DAY 90 tablet 3  . ramipril (ALTACE) 10 MG capsule TAKE 1 CAPSULE BY MOUTH TWICE DAILY 180 capsule 3  . tamsulosin (FLOMAX) 0.4 MG CAPS capsule TAKE 1 CAPSULE(0.4 MG) BY MOUTH DAILY 90 capsule 0  . tiZANidine (ZANAFLEX) 4 MG tablet Take 1 tablet (4 mg total) by mouth every 8 (eight) hours as needed for muscle spasms. 270 tablet 3  . vitamin C (ASCORBIC ACID) 500 MG tablet Take 500 mg by mouth daily.    Marland Kitchen zolpidem (AMBIEN CR) 12.5 MG CR tablet Take 1 tablet (12.5 mg total) by mouth at bedtime as needed for sleep. 30 tablet 0  . nitroGLYCERIN (NITROSTAT) 0.4 MG SL tablet DISSOLVE 1 TABLET UNDER THE TONGUE EVERY 5 MINUTES FOR 3 DOSES AS NEEDED FOR CHEST PAIN 25 tablet 6    No results found for this or any previous visit (from the past 48 hour(s)). No results found.  Review of Systems  All other systems reviewed and are negative.   Height 5\' 10"  (1.778 m), weight 95.3 kg. Physical Exam  Constitutional: He is oriented to person, place, and time. He appears well-developed and well-nourished. No distress.  HENT:  Head: Normocephalic and atraumatic.  Right Ear: External ear normal.  Left Ear: External ear normal.  Nose: Nose normal.  Mouth/Throat: Oropharynx is clear and moist.  Eyes: Pupils are equal, round, and reactive to light. Conjunctivae and EOM are normal.  Cardiovascular: Normal rate.  Respiratory: Effort normal.  Musculoskeletal:     Cervical back: Normal range of motion and neck supple.  Neurological: He is alert and oriented to person, place, and time. No cranial nerve deficit.  Skin: Skin is warm and dry.  Psychiatric: He has a normal mood and affect. His behavior is normal. Judgment and thought content normal.     Assessment/Plan Sleep apnea  To OR for drug-induced  sleep endoscopy.  Melida Quitter, MD 02/18/2020, 11:26 AM

## 2020-02-18 NOTE — Transfer of Care (Signed)
Immediate Anesthesia Transfer of Care Note  Patient: [name] Brown  Procedure(s) Performed: DRUG INDUCED ENDOSCOPY (N/A )  Patient Location: PACU  Anesthesia Type:MAC  Level of Consciousness: awake, alert , oriented and patient cooperative  Airway & Oxygen Therapy: Patient Spontanous Breathing and Patient connected to face mask oxygen  Post-op Assessment: Report given to RN and Post -op Vital signs reviewed and stable  Post vital signs: Reviewed and stable  Last Vitals:  Vitals Value Taken Time  BP 110/64 02/18/20 1200  Temp 36.5 C 02/18/20 1200  Pulse 40 02/18/20 1202  Resp 16 02/18/20 1202  SpO2 98 % 02/18/20 1202  Vitals shown include unvalidated device data.  Last Pain:  Vitals:   02/18/20 1133  TempSrc: Temporal  PainSc: 0-No pain         Complications: No apparent anesthesia complications

## 2020-02-18 NOTE — Anesthesia Procedure Notes (Signed)
Procedure Name: MAC Date/Time: 02/18/2020 11:55 AM Performed by: Signe Colt, CRNA Pre-anesthesia Checklist: Patient identified, Emergency Drugs available, Suction available, Patient being monitored and Timeout performed Patient Re-evaluated:Patient Re-evaluated prior to induction Oxygen Delivery Method: Simple face mask

## 2020-02-18 NOTE — Op Note (Signed)
NAME: AKRAM, MOBBS MEDICAL RECORD [id number] ACCOUNT [id number] DATE OF BIRTH:08-24-1951 FACILITY: [facility] LOCATION: [facility] PHYSICIAN:Irwin Toran Guido Sander, MD  OPERATIVE REPORT  DATE OF PROCEDURE:  02/18/2020  PREOPERATIVE DIAGNOSIS:  Obstructive sleep apnea.  POSTOPERATIVE DIAGNOSIS:  Obstructive sleep apnea.  PROCEDURE:  Drug-induced sleep endoscopy.  SURGEON:  Melida Quitter, MD  ANESTHESIA:  IV sedation.  COMPLICATIONS:  None.  INDICATIONS:  The patient is a 68 year old male who has had difficulty tolerating CPAP and presents to the operating room for sleep endoscopy.  FINDINGS:  Collapse at the level of the velum was completely anterior-posterior making him a good candidate for the hypoglossal nerve stimulator.  In addition, he had marked anteroposterior collapse at the level of the tongue base and epiglottis.  DESCRIPTION OF PROCEDURE:  The patient was identified in the holding room, informed consent having been obtained including discussion of risks, benefits and alternatives, the patient was brought to the operative suite and put the operative table in  supine position.  IV sedation was induced and the patient was brought to a level of simulated sleep.  An Afrin-soaked pledget was placed in the right nasal passage for a couple minutes and then removed.  The fiberoptic laryngoscope was then inserted  through the right nasal passage to view the nasopharynx, oropharynx, and hypopharynx.  Findings are noted above.  The exam was recorded.  After this was completed, the scope was removed.  The patient was returned to anesthesia for wakeup.  He was moved  to recovery room in stable condition.  CN/NUANCE  D:02/18/2020 T:02/18/2020 JOB:[id number]

## 2020-02-18 NOTE — Brief Op Note (Signed)
02/18/2020  11:56 AM  PATIENT:  [name] Brown  69 y.o. male  PRE-OPERATIVE DIAGNOSIS:  sleep apnea  POST-OPERATIVE DIAGNOSIS:  sleep apnea  PROCEDURE:  Procedure(s): DRUG INDUCED ENDOSCOPY (N/A)  SURGEON:  Surgeon(s) and Role:    Melida Quitter, MD - Primary  PHYSICIAN ASSISTANT:   ASSISTANTS: none   ANESTHESIA:   IV sedation  EBL: None  BLOOD ADMINISTERED:none  DRAINS: none   LOCAL MEDICATIONS USED:  NONE  SPECIMEN:  No Specimen  DISPOSITION OF SPECIMEN:  N/A  COUNTS:  YES  TOURNIQUET:  * No tourniquets in log *  DICTATION: .Other Dictation: Dictation Number [id number]  PLAN OF CARE: Discharge to home after PACU  PATIENT DISPOSITION:  PACU - hemodynamically stable.   Delay start of Pharmacological VTE agent (>24hrs) due to surgical blood loss or risk of bleeding: no

## 2020-02-18 NOTE — Anesthesia Preprocedure Evaluation (Addendum)
Anesthesia Evaluation    Airway Mallampati: I       Dental no notable dental hx. (+) Teeth Intact   Pulmonary sleep apnea , former smoker,    Pulmonary exam normal breath sounds clear to auscultation       Cardiovascular hypertension, Pt. on medications + CAD, + Past MI and + CABG  Normal cardiovascular exam+ dysrhythmias Atrial Fibrillation  Rhythm:Regular Rate:Normal     Neuro/Psych  Headaches,    GI/Hepatic Neg liver ROS,   Endo/Other    Renal/GU Renal InsufficiencyRenal disease     Musculoskeletal   Abdominal Normal abdominal exam  (+)   Peds  Hematology   Anesthesia Other Findings ventricular ejection fraction, by estimation, is 55 to 60%. The left ventricle has normal function. Left ventricular endocardial border not optimally defined to evaluate regional wall motion. There is mild left ventricular hypertrophy. Left ventricular diastolic parameters are consistent with Grade II diastolic dysfunction (pseudonormalization). 2. Right ventricular systolic function is normal. The right ventricular size is mildly enlarged. There is mildly elevated pulmonary artery systolic pressure. 3. Left atrial size was moderately dilated. 4. Right atrial size was mildly dilated. 5. The mitral valve is normal in structure. Mild mitral valve regurgitation. 6. The aortic valve is tricuspid. Aortic valve regurgitation is mild. Mild aortic valve sclerosis is present, with no evidence of aortic valve stenosis. 7. The inferior vena cava is normal in size with greater than 50% respiratory variability, suggesting right atrial pressure of 3 mmHg. Left Ventricle: Left ventricular  Study Highlights    The left ventricular ejection fraction is normal (55-65%).  Nuclear stress EF: 56%. Post CABG septal wall hypokinesis.  There was no ST segment deviation noted during stress.  There is mildly reduced radiotracer uptake at both rest and  stress in the anterior, lateral and inferolateral distributions. No ischemia identified. (POST CABG)  This is a low risk study.   Candee Furbish, MD     Reproductive/Obstetrics                            Anesthesia Physical Anesthesia Plan  ASA: III  Anesthesia Plan: General   Post-op Pain Management:    Induction: Intravenous  PONV Risk Score and Plan: Propofol infusion, Ondansetron and Dexamethasone  Airway Management Planned: Natural Airway and Nasal Cannula  Additional Equipment: None  Intra-op Plan:   Post-operative Plan:   Informed Consent: I have reviewed the patients History and Physical, chart, labs and discussed the procedure including the risks, benefits and alternatives for the proposed anesthesia with the patient or authorized representative who has indicated his/her understanding and acceptance.       Plan Discussed with: CRNA  Anesthesia Plan Comments:         Anesthesia Quick Evaluation

## 2020-02-18 NOTE — Discharge Instructions (Signed)

## 2020-02-18 NOTE — Anesthesia Postprocedure Evaluation (Signed)
Anesthesia Post Note  Patient: [name] Brown  Procedure(s) Performed: DRUG INDUCED ENDOSCOPY (Right Nose)     Patient location during evaluation: Phase II Anesthesia Type: General Level of consciousness: awake Pain management: pain level controlled Vital Signs Assessment: post-procedure vital signs reviewed and stable Cardiovascular status: bradycardic Postop Assessment: no apparent nausea or vomiting Anesthetic complications: no    Last Vitals:  Vitals:   02/18/20 1215 02/18/20 1228  BP: 135/73 129/72  Pulse: (!) 42 (!) 40  Resp: 12 12  Temp:    SpO2: 95% 97%    Last Pain:  Vitals:   02/18/20 1228  TempSrc:   PainSc: 0-No pain   Pain Goal:                   Huston Foley

## 2020-02-19 ENCOUNTER — Encounter: Payer: Self-pay | Admitting: *Deleted

## 2020-02-19 DIAGNOSIS — G8929 Other chronic pain: Secondary | ICD-10-CM | POA: Diagnosis not present

## 2020-02-19 DIAGNOSIS — M545 Low back pain: Secondary | ICD-10-CM | POA: Diagnosis not present

## 2020-02-21 DIAGNOSIS — H2513 Age-related nuclear cataract, bilateral: Secondary | ICD-10-CM | POA: Diagnosis not present

## 2020-02-21 DIAGNOSIS — H5203 Hypermetropia, bilateral: Secondary | ICD-10-CM | POA: Diagnosis not present

## 2020-02-22 DIAGNOSIS — M545 Low back pain: Secondary | ICD-10-CM | POA: Diagnosis not present

## 2020-02-22 DIAGNOSIS — G8929 Other chronic pain: Secondary | ICD-10-CM | POA: Diagnosis not present

## 2020-02-25 DIAGNOSIS — M545 Low back pain: Secondary | ICD-10-CM | POA: Diagnosis not present

## 2020-02-25 DIAGNOSIS — G8929 Other chronic pain: Secondary | ICD-10-CM | POA: Diagnosis not present

## 2020-03-04 DIAGNOSIS — G8929 Other chronic pain: Secondary | ICD-10-CM | POA: Diagnosis not present

## 2020-03-04 DIAGNOSIS — M545 Low back pain: Secondary | ICD-10-CM | POA: Diagnosis not present

## 2020-03-04 NOTE — Telephone Encounter (Signed)
Patient states he is returning a call to [facility] in regards to ablation. He states he received the call sometime last week, however he was not able to return her call at that time. Please call.

## 2020-03-05 NOTE — Telephone Encounter (Signed)
Late Entry: Did speak w/ pt yesterday who would like to move procedure date up to 6/9. Aware I will make arrangements and send instructions via mychart.

## 2020-03-06 DIAGNOSIS — G8929 Other chronic pain: Secondary | ICD-10-CM | POA: Diagnosis not present

## 2020-03-06 DIAGNOSIS — M545 Low back pain: Secondary | ICD-10-CM | POA: Diagnosis not present

## 2020-03-12 ENCOUNTER — Other Ambulatory Visit (HOSPITAL_BASED_OUTPATIENT_CLINIC_OR_DEPARTMENT_OTHER): Payer: Self-pay

## 2020-03-12 ENCOUNTER — Other Ambulatory Visit: Payer: Self-pay | Admitting: Internal Medicine

## 2020-03-12 DIAGNOSIS — R0681 Apnea, not elsewhere classified: Secondary | ICD-10-CM

## 2020-03-12 DIAGNOSIS — N401 Enlarged prostate with lower urinary tract symptoms: Secondary | ICD-10-CM

## 2020-03-13 ENCOUNTER — Telehealth: Payer: Self-pay | Admitting: Cardiovascular Disease

## 2020-03-13 NOTE — Telephone Encounter (Signed)
Patient is inquiring about whether or not he will need a COVID-19 test prior to ablation scheduled for 03/19/20. Please call.

## 2020-03-13 NOTE — Telephone Encounter (Signed)
I spoke to the patient who is having an ablation on 6/9 and needed to be scheduled for CoVid Test.  I scheduled him for 6/7 and patient says that he never received instructions for ablation.  I told him that I would forward to nurse.

## 2020-03-14 ENCOUNTER — Telehealth (HOSPITAL_COMMUNITY): Payer: Self-pay | Admitting: *Deleted

## 2020-03-14 NOTE — Telephone Encounter (Signed)
Reviewed CT & procedure instructions and sent via Mychart. Pt aware office will call to arrange post procedure follow up. Patient verbalized understanding and agreeable to plan.

## 2020-03-14 NOTE — Telephone Encounter (Signed)
Attempted to call patient regarding upcoming cardiac CT appointment. °Left message on voicemail with name and callback number ° °Amauri Keefe Tai RN Navigator Cardiac Imaging °[facility] °[phone] Office °[phone] Cell ° °

## 2020-03-17 ENCOUNTER — Ambulatory Visit (HOSPITAL_COMMUNITY)
Admission: RE | Admit: 2020-03-17 | Discharge: 2020-03-17 | Disposition: A | Discharge status: Home / Self Care | Payer: PPO | Source: Ambulatory Visit | Attending: Cardiology | Admitting: Cardiology

## 2020-03-17 ENCOUNTER — Other Ambulatory Visit (HOSPITAL_COMMUNITY)
Admission: RE | Admit: 2020-03-17 | Discharge: 2020-03-17 | Disposition: A | Discharge status: Home / Self Care | Payer: PPO | Source: Ambulatory Visit | Attending: Cardiology | Admitting: Cardiology

## 2020-03-17 ENCOUNTER — Telehealth: Payer: Self-pay | Admitting: Cardiology

## 2020-03-17 ENCOUNTER — Encounter (HOSPITAL_COMMUNITY): Payer: Self-pay

## 2020-03-17 ENCOUNTER — Other Ambulatory Visit: Payer: Self-pay

## 2020-03-17 DIAGNOSIS — Z01812 Encounter for preprocedural laboratory examination: Secondary | ICD-10-CM | POA: Diagnosis not present

## 2020-03-17 DIAGNOSIS — I4891 Unspecified atrial fibrillation: Secondary | ICD-10-CM | POA: Diagnosis not present

## 2020-03-17 DIAGNOSIS — Z20822 Contact with and (suspected) exposure to covid-19: Secondary | ICD-10-CM | POA: Diagnosis not present

## 2020-03-17 DIAGNOSIS — I700 Atherosclerosis of aorta: Secondary | ICD-10-CM | POA: Diagnosis not present

## 2020-03-17 LAB — SARS CORONAVIRUS 2 (TAT 6-24 HRS): SARS Coronavirus 2: NEGATIVE

## 2020-03-17 MED ORDER — IOHEXOL 350 MG/ML SOLN
80.0000 mL | Freq: Once | INTRAVENOUS | Status: AC | PRN
  Administered 2020-03-17: 80 mL via INTRAVENOUS

## 2020-03-17 NOTE — Telephone Encounter (Signed)
Pt c/o BP issue: STAT if pt c/o blurred vision, one-sided weakness or slurred speech  1. What are your last 5 BP readings?   03/15/20: 73/43   2. Are you having any other symptoms (ex. Dizziness, headache, blurred vision, passed out)? Dizziness, blurred-vision  3. What is your BP issue? Patient states on 03/15/20 BP became extremely low on exertion.     STAT if patient feels like he/she is going to faint   1) Are you dizzy now? No  2) Do you feel faint or have you passed out? No  3) Do you have any other symptoms? No  4) Have you checked your HR and BP (record if available)?  03/15/20:  BP - 73/49

## 2020-03-17 NOTE — Telephone Encounter (Signed)
Called patient back about his message. Patient complaining of low BP and low HR that started on Saturday morning. Patient stated he was outside doing some light gardening when he felt dizzy and had some blurred vision. Patient's stated his SBP ran between 75 to 94 and HR 40's to 50's. Patient stated later in the afternoon when he was not doing anything his BP would come back up to 143/72. Sunday patient's BP began at 142/74 and dropped down to 84/45 and then went back up to 129/64. Patient stated he was outside during his dizziness and blurred vision. Informed patient that it was fairly hot this weekend. Asked patient if he was staying hydrated. Patient stated he was keeping hydrated. Patient is really concerned due to being scheduled for an ablation on 03/19/20. Patient wants to hear from Dr. Curt Bears and see if he needs to cancel his procedure due to his BP being so low. Consulted DOD, Dr. Johnsie Cancel, he advised for Dr. Curt Bears to advise since patient has procedure coming up. Will forward to Dr. Curt Bears for advisement.

## 2020-03-18 NOTE — Telephone Encounter (Signed)
Dr. Curt Bears advised for patient to continue with having ablation tomorrow. Patient agreed to plan and will discuss BPs tomorrow.

## 2020-03-19 ENCOUNTER — Ambulatory Visit (HOSPITAL_COMMUNITY): Payer: PPO | Admitting: Anesthesiology

## 2020-03-19 ENCOUNTER — Encounter (HOSPITAL_COMMUNITY)
Admission: RE | Disposition: A | Discharge status: Home / Self Care | Payer: PPO | Source: Home / Self Care | Attending: Cardiology

## 2020-03-19 ENCOUNTER — Other Ambulatory Visit: Payer: Self-pay

## 2020-03-19 ENCOUNTER — Ambulatory Visit (HOSPITAL_COMMUNITY)
Admission: RE | Admit: 2020-03-19 | Discharge: 2020-03-19 | Disposition: A | Discharge status: Home / Self Care | Payer: PPO | Attending: Cardiology | Admitting: Cardiology

## 2020-03-19 DIAGNOSIS — I2510 Atherosclerotic heart disease of native coronary artery without angina pectoris: Secondary | ICD-10-CM | POA: Diagnosis not present

## 2020-03-19 DIAGNOSIS — I252 Old myocardial infarction: Secondary | ICD-10-CM | POA: Diagnosis not present

## 2020-03-19 DIAGNOSIS — G4730 Sleep apnea, unspecified: Secondary | ICD-10-CM | POA: Diagnosis not present

## 2020-03-19 DIAGNOSIS — I4891 Unspecified atrial fibrillation: Secondary | ICD-10-CM | POA: Diagnosis not present

## 2020-03-19 DIAGNOSIS — E785 Hyperlipidemia, unspecified: Secondary | ICD-10-CM | POA: Diagnosis not present

## 2020-03-19 DIAGNOSIS — I4819 Other persistent atrial fibrillation: Secondary | ICD-10-CM | POA: Insufficient documentation

## 2020-03-19 DIAGNOSIS — Z951 Presence of aortocoronary bypass graft: Secondary | ICD-10-CM | POA: Diagnosis not present

## 2020-03-19 DIAGNOSIS — I10 Essential (primary) hypertension: Secondary | ICD-10-CM | POA: Insufficient documentation

## 2020-03-19 DIAGNOSIS — R001 Bradycardia, unspecified: Secondary | ICD-10-CM | POA: Insufficient documentation

## 2020-03-19 DIAGNOSIS — Z87891 Personal history of nicotine dependence: Secondary | ICD-10-CM | POA: Insufficient documentation

## 2020-03-19 DIAGNOSIS — I480 Paroxysmal atrial fibrillation: Secondary | ICD-10-CM | POA: Diagnosis not present

## 2020-03-19 HISTORY — PX: ATRIAL FIBRILLATION ABLATION: EP1191

## 2020-03-19 LAB — BASIC METABOLIC PANEL
Anion gap: 10 (ref 5–15)
BUN: 40 mg/dL — ABNORMAL HIGH (ref 8–23)
CO2: 24 mmol/L (ref 22–32)
Calcium: 9.3 mg/dL (ref 8.9–10.3)
Chloride: 104 mmol/L (ref 98–111)
Creatinine, Ser: 1.74 mg/dL — ABNORMAL HIGH (ref 0.61–1.24)
GFR calc Af Amer: 46 mL/min — ABNORMAL LOW (ref >60)
GFR calc non Af Amer: 39 mL/min — ABNORMAL LOW (ref >60)
Glucose, Bld: 87 mg/dL (ref 70–99)
Potassium: 5.4 mmol/L — ABNORMAL HIGH (ref 3.5–5.1)
Sodium: 138 mmol/L (ref 135–145)

## 2020-03-19 LAB — CBC
HCT: 32.8 % — ABNORMAL LOW (ref 39.0–52.0)
Hemoglobin: 10.8 g/dL — ABNORMAL LOW (ref 13.0–17.0)
MCH: 33.6 pg (ref 26.0–34.0)
MCHC: 32.9 g/dL (ref 30.0–36.0)
MCV: 102.2 fL — ABNORMAL HIGH (ref 80.0–100.0)
Platelets: 183 10*3/uL (ref 150–400)
RBC: 3.21 MIL/uL — ABNORMAL LOW (ref 4.22–5.81)
RDW: 14.6 % (ref 11.5–15.5)
WBC: 4.9 10*3/uL (ref 4.0–10.5)
nRBC: 0 % (ref 0.0–0.2)

## 2020-03-19 LAB — POCT ACTIVATED CLOTTING TIME
Activated Clotting Time: 246 seconds
Activated Clotting Time: 296 seconds
Activated Clotting Time: 318 seconds

## 2020-03-19 SURGERY — ATRIAL FIBRILLATION ABLATION
Anesthesia: General

## 2020-03-19 MED ORDER — SODIUM CHLORIDE 0.9% FLUSH: 3.0000 mL | INTRAVENOUS | Status: DC | PRN

## 2020-03-19 MED ORDER — SODIUM CHLORIDE 0.9 % IV SOLN: 250.0000 mL | INTRAVENOUS | Status: DC | PRN

## 2020-03-19 MED ORDER — SODIUM CHLORIDE 0.9% FLUSH: 3.0000 mL | Freq: Two times a day (BID) | INTRAVENOUS | Status: DC

## 2020-03-19 MED ORDER — HEPARIN (PORCINE) IN NACL 1000-0.9 UT/500ML-% IV SOLN
INTRAVENOUS | Status: AC
  Filled 2020-03-19: qty 500

## 2020-03-19 MED ORDER — SUGAMMADEX SODIUM 200 MG/2ML IV SOLN
INTRAVENOUS | Status: DC | PRN
  Administered 2020-03-19: 200 mg via INTRAVENOUS

## 2020-03-19 MED ORDER — ACETAMINOPHEN 325 MG PO TABS
650.0000 mg | ORAL_TABLET | ORAL | Status: DC | PRN
  Administered 2020-03-19: 650 mg via ORAL
  Filled 2020-03-19: qty 2

## 2020-03-19 MED ORDER — HEPARIN SODIUM (PORCINE) 1000 UNIT/ML IJ SOLN
INTRAMUSCULAR | Status: DC | PRN
Start: 2020-03-19 — End: 2020-03-19
  Administered 2020-03-19: 15000 [IU] via INTRAVENOUS
  Administered 2020-03-19: 4000 [IU] via INTRAVENOUS
  Administered 2020-03-19: 6000 [IU] via INTRAVENOUS
  Administered 2020-03-19: 2000 [IU] via INTRAVENOUS

## 2020-03-19 MED ORDER — ONDANSETRON HCL 4 MG/2ML IJ SOLN: 4.0000 mg | Freq: Four times a day (QID) | INTRAMUSCULAR | Status: DC | PRN

## 2020-03-19 MED ORDER — PROTAMINE SULFATE 10 MG/ML IV SOLN
INTRAVENOUS | Status: DC | PRN
  Administered 2020-03-19: 20 mg via INTRAVENOUS
  Administered 2020-03-19: 10 mg via INTRAVENOUS
  Administered 2020-03-19 (×2): 20 mg via INTRAVENOUS

## 2020-03-19 MED ORDER — FENTANYL CITRATE (PF) 250 MCG/5ML IJ SOLN
INTRAMUSCULAR | Status: DC | PRN
  Administered 2020-03-19: 60 ug via INTRAVENOUS
  Administered 2020-03-19: 50 ug via INTRAVENOUS

## 2020-03-19 MED ORDER — ROCURONIUM BROMIDE 10 MG/ML (PF) SYRINGE
PREFILLED_SYRINGE | INTRAVENOUS | Status: DC | PRN
  Administered 2020-03-19: 50 mg via INTRAVENOUS
  Administered 2020-03-19: 40 mg via INTRAVENOUS

## 2020-03-19 MED ORDER — DOBUTAMINE IN D5W 4-5 MG/ML-% IV SOLN
INTRAVENOUS | Status: AC
  Filled 2020-03-19: qty 250

## 2020-03-19 MED ORDER — HEPARIN SODIUM (PORCINE) 1000 UNIT/ML IJ SOLN
INTRAMUSCULAR | Status: DC | PRN
  Administered 2020-03-19: 1000 [IU] via INTRAVENOUS

## 2020-03-19 MED ORDER — HEPARIN SODIUM (PORCINE) 1000 UNIT/ML IJ SOLN
INTRAMUSCULAR | Status: AC
  Filled 2020-03-19: qty 1

## 2020-03-19 MED ORDER — ACETAMINOPHEN 500 MG PO TABS
1000.0000 mg | ORAL_TABLET | Freq: Once | ORAL | Status: AC
  Administered 2020-03-19: 1000 mg via ORAL
  Filled 2020-03-19 (×2): qty 2

## 2020-03-19 MED ORDER — EPHEDRINE SULFATE-NACL 50-0.9 MG/10ML-% IV SOSY
PREFILLED_SYRINGE | INTRAVENOUS | Status: DC | PRN
  Administered 2020-03-19 (×3): 10 mg via INTRAVENOUS
  Administered 2020-03-19: 5 mg via INTRAVENOUS
  Administered 2020-03-19: 10 mg via INTRAVENOUS

## 2020-03-19 MED ORDER — SODIUM CHLORIDE 0.9 % IV SOLN: INTRAVENOUS | Status: DC

## 2020-03-19 MED ORDER — GLYCOPYRROLATE PF 0.2 MG/ML IJ SOSY
PREFILLED_SYRINGE | INTRAMUSCULAR | Status: DC | PRN
  Administered 2020-03-19 (×2): .1 mg via INTRAVENOUS

## 2020-03-19 MED ORDER — DEXAMETHASONE SODIUM PHOSPHATE 10 MG/ML IJ SOLN
INTRAMUSCULAR | Status: DC | PRN
  Administered 2020-03-19: 5 mg via INTRAVENOUS

## 2020-03-19 MED ORDER — ONDANSETRON HCL 4 MG/2ML IJ SOLN
INTRAMUSCULAR | Status: DC | PRN
  Administered 2020-03-19: 4 mg via INTRAVENOUS

## 2020-03-19 MED ORDER — PROPOFOL 10 MG/ML IV BOLUS
INTRAVENOUS | Status: DC | PRN
  Administered 2020-03-19: 140 mg via INTRAVENOUS
  Administered 2020-03-19: 60 mg via INTRAVENOUS

## 2020-03-19 MED ORDER — PHENYLEPHRINE HCL-NACL 10-0.9 MG/250ML-% IV SOLN
INTRAVENOUS | Status: DC | PRN
  Administered 2020-03-19: 15 ug/min via INTRAVENOUS

## 2020-03-19 MED ORDER — DOBUTAMINE IN D5W 4-5 MG/ML-% IV SOLN
INTRAVENOUS | Status: DC | PRN
  Administered 2020-03-19: 20 ug/kg/min via INTRAVENOUS

## 2020-03-19 MED ORDER — HEPARIN (PORCINE) IN NACL 1000-0.9 UT/500ML-% IV SOLN
INTRAVENOUS | Status: DC | PRN
  Administered 2020-03-19 (×5): 500 mL

## 2020-03-19 MED ORDER — ACETAMINOPHEN 325 MG PO TABS
ORAL_TABLET | ORAL | Status: AC
  Filled 2020-03-19: qty 2

## 2020-03-19 SURGICAL SUPPLY — 23 items
BAG SNAP BAND KOVER 36X36 (MISCELLANEOUS) ×3 IMPLANT
BLANKET WARM UNDERBOD FULL ACC (MISCELLANEOUS) ×3 IMPLANT
CATH 8FR REPROCESSED SOUNDSTAR (CATHETERS) ×3 IMPLANT
CATH MAPPNG PENTARAY F 2-6-2MM (CATHETERS) ×1 IMPLANT
CATH SMTCH THERMOCOOL SF DF (CATHETERS) ×3 IMPLANT
CATH WEBSTER BI DIR CS D-F CRV (CATHETERS) ×3 IMPLANT
COVER SWIFTLINK CONNECTOR (BAG) ×3 IMPLANT
DEVICE CLOSURE PERCLS PRGLD 6F (VASCULAR PRODUCTS) ×4 IMPLANT
PACK EP LATEX FREE (CUSTOM PROCEDURE TRAY) ×3
PACK EP LF (CUSTOM PROCEDURE TRAY) ×1 IMPLANT
PAD PRO RADIOLUCENT 2001M-C (PAD) ×3 IMPLANT
PATCH CARTO3 (PAD) ×3 IMPLANT
PENTARAY F 2-6-2MM (CATHETERS) ×3
PERCLOSE PROGLIDE 6F (VASCULAR PRODUCTS) ×12
SHEATH BAYLIS SUREFLEX  M 8.5 (SHEATH) ×3
SHEATH BAYLIS SUREFLEX M 8.5 (SHEATH) ×1 IMPLANT
SHEATH BAYLIS TRANSSEPTAL 98CM (NEEDLE) ×3 IMPLANT
SHEATH CARTO VIZIGO SM CVD (SHEATH) ×3 IMPLANT
SHEATH PINNACLE 7F 10CM (SHEATH) ×3 IMPLANT
SHEATH PINNACLE 8F 10CM (SHEATH) ×6 IMPLANT
SHEATH PINNACLE 9F 10CM (SHEATH) ×3 IMPLANT
SHEATH PROBE COVER 6X72 (BAG) ×3 IMPLANT
TUBING SMART ABLATE COOLFLOW (TUBING) ×3 IMPLANT

## 2020-03-19 NOTE — Anesthesia Preprocedure Evaluation (Addendum)
Anesthesia Evaluation  Patient identified by MRN, date of birth, ID band Patient awake    Reviewed: Allergy & Precautions, H&P , NPO status , Patient's Chart, lab work & pertinent test results  Airway Mallampati: II  TM Distance: >3 FB Neck ROM: Full    Dental no notable dental hx. (+) Teeth Intact, Dental Advisory Given   Pulmonary sleep apnea , former smoker,    Pulmonary exam normal breath sounds clear to auscultation       Cardiovascular hypertension, Pt. on medications + CAD, + Past MI and + CABG  + dysrhythmias Atrial Fibrillation  Rhythm:Regular Rate:Normal     Neuro/Psych  Headaches, negative psych ROS   GI/Hepatic negative GI ROS, Neg liver ROS,   Endo/Other  negative endocrine ROS  Renal/GU negative Renal ROS  negative genitourinary   Musculoskeletal  (+) Arthritis , Osteoarthritis,    Abdominal   Peds  Hematology  (+) Blood dyscrasia, anemia ,   Anesthesia Other Findings   Reproductive/Obstetrics negative OB ROS                            Anesthesia Physical Anesthesia Plan  ASA: III  Anesthesia Plan: General   Post-op Pain Management:    Induction: Intravenous  PONV Risk Score and Plan: 2 and Ondansetron, Dexamethasone and Midazolam  Airway Management Planned: Oral ETT  Additional Equipment:   Intra-op Plan:   Post-operative Plan: Extubation in OR  Informed Consent: I have reviewed the patients History and Physical, chart, labs and discussed the procedure including the risks, benefits and alternatives for the proposed anesthesia with the patient or authorized representative who has indicated his/her understanding and acceptance.     Dental advisory given  Plan Discussed with: CRNA  Anesthesia Plan Comments:         Anesthesia Quick Evaluation

## 2020-03-19 NOTE — Progress Notes (Addendum)
Dr Curt Bears notified on continued oozing bilat groins and per Dr Curt Bears will keep in bed additional 17min; bilat groin dressing changes

## 2020-03-19 NOTE — Progress Notes (Signed)
Up and walked and tolerated well; bilat groins stable, no bleeding or hematoma 

## 2020-03-19 NOTE — Transfer of Care (Signed)
Immediate Anesthesia Transfer of Care Note  Patient: [name] Brown  Procedure(s) Performed: ATRIAL FIBRILLATION ABLATION (N/A )  Patient Location: PACU and Cath Lab  Anesthesia Type:General  Level of Consciousness: awake and patient cooperative  Airway & Oxygen Therapy: Patient Spontanous Breathing and Patient connected to face mask oxygen  Post-op Assessment: Report given to RN and Post -op Vital signs reviewed and stable  Post vital signs: Reviewed and stable  Last Vitals:  Vitals Value Taken Time  BP 157/60 03/19/20 1551  Temp 36.6 C 03/19/20 1554  Pulse 57 03/19/20 1556  Resp 15 03/19/20 1556  SpO2 98 % 03/19/20 1556  Vitals shown include unvalidated device data.  Last Pain:  Vitals:   03/19/20 1554  TempSrc: Temporal  PainSc:       Patients Stated Pain Goal: 7 (38/37/79 3968)  Complications: No apparent anesthesia complications

## 2020-03-19 NOTE — Progress Notes (Signed)
Dressing to left groin changed due to ooze noted.

## 2020-03-19 NOTE — Anesthesia Procedure Notes (Signed)
Procedure Name: Intubation Date/Time: 03/19/2020 12:54 PM Performed by: Renato Shin, CRNA Pre-anesthesia Checklist: Patient identified, Emergency Drugs available, Suction available and Patient being monitored Patient Re-evaluated:Patient Re-evaluated prior to induction Oxygen Delivery Method: Circle system utilized Preoxygenation: Pre-oxygenation with 100% oxygen Induction Type: IV induction Ventilation: Mask ventilation without difficulty and Oral airway inserted - appropriate to patient size Laryngoscope Size: Glidescope and 4 Grade View: Grade IV Tube type: Oral Tube size: 7.5 mm Number of attempts: 1 Airway Equipment and Method: Stylet and Oral airway Placement Confirmation: ETT inserted through vocal cords under direct vision,  positive ETCO2 and breath sounds checked- equal and bilateral Secured at: 22 cm Tube secured with: Tape Dental Injury: Teeth and Oropharynx as per pre-operative assessment  Difficulty Due To: Difficult Airway- due to anterior larynx and Difficulty was unanticipated Future Recommendations: Recommend- induction with short-acting agent, and alternative techniques readily available Comments: DL x2 Mil 3, grade 4 view. AOI with Glidescope 4. VSS throughout. Pt easily masked with OA.

## 2020-03-19 NOTE — Anesthesia Postprocedure Evaluation (Signed)
Anesthesia Post Note  Patient: [name] Brown  Procedure(s) Performed: ATRIAL FIBRILLATION ABLATION (N/A )     Patient location during evaluation: PACU Anesthesia Type: General Level of consciousness: awake and alert and oriented Pain management: pain level controlled Vital Signs Assessment: post-procedure vital signs reviewed and stable Respiratory status: spontaneous breathing, nonlabored ventilation and respiratory function stable Cardiovascular status: blood pressure returned to baseline and stable Postop Assessment: no apparent nausea or vomiting Anesthetic complications: no    Last Vitals:  Vitals:   03/19/20 1615 03/19/20 1630  BP: (!) 144/55 (!) 137/54  Pulse: (!) 54 (!) 55  Resp: 16 12  Temp:  36.6 C  SpO2: 93% 99%    Last Pain:  Vitals:   03/19/20 1554  TempSrc: Temporal  PainSc:                  Deshannon Seide A.

## 2020-03-19 NOTE — H&P (Signed)
Electrophysiology Office Note   Date:  03/19/2020   ID:  EDREI NORGAARD, DOB 1951/09/24, MRN [id number]  PCP:  Hoyt Koch, MD  Cardiologist:  Johnsie Cancel Primary Electrophysiologist:  Ilia Dimaano Meredith Leeds, MD    No chief complaint on file.    History of Present Illness: [name] Brown is a 69 y.o. male who is being seen today for the evaluation of atrial fibrillation at the request of No ref. provider found. Presenting today for electrophysiology evaluation.  He has a history of coronary artery disease status post three-vessel CABG, hypertension, hyperlipidemia, and atrial fibrillation.  He was found to be in atrial fibrillation incidentally on office visit 08/30/2017.  He was put on Eliquis at the time.  Beta-blockers were not started due to resting bradycardia.  January 2020 he was noted increasing dyspnea on exertion.  He had a cardioversion 11/28/2016.   Today, denies symptoms of palpitations, chest pain, shortness of breath, orthopnea, PND, lower extremity edema, claudication, dizziness, presyncope, syncope, bleeding, or neurologic sequela. The patient is tolerating medications without difficulties.    Past Medical History:  Diagnosis Date  . Aortic aneurysm ([place]) Endograf 2009  . Arthritis   . Atrial fibrillation ([place])   . Bradycardia   . CAD (coronary artery disease) CABG 03/2000   Median sternotomy for coronary artery bypass grafting x 3 (left  . HTN (hypertension)   . Hx of CABG 2001   severe 3 vessel disease  . Hx of colonic polyp 06/2010   Hyperplastic   . Hyperlipidemia   . Myocardial infarction ([employer])   . Sleep apnea    Past Surgical History:  Procedure Laterality Date  . CARDIOVERSION N/A 11/28/2018   Procedure: CARDIOVERSION;  Surgeon: Elouise Munroe, MD;  Location: [facility] ENDOSCOPY;  Service: Cardiovascular;  Laterality: N/A;  . COLONOSCOPY    . CORONARY ARTERY BYPASS GRAFT     AAA stent graft on 12/01/2007 at [facility]  coronary  bypass in 2001. Dr. Roxy Manns.  Marland Kitchen DRUG INDUCED ENDOSCOPY Right 02/18/2020   Procedure: DRUG INDUCED ENDOSCOPY;  Surgeon: Melida Quitter, MD;  Location: [place];  Service: ENT;  Laterality: Right;  . FOOT SURGERY Left 07/19/2017  . LEFT HEART CATHETERIZATION WITH CORONARY ANGIOGRAM N/A 10/26/2013   Procedure: LEFT HEART CATHETERIZATION WITH CORONARY ANGIOGRAM;  Surgeon: Peter M Martinique, MD;  Location: [facility] CATH LAB;  Service: Cardiovascular;  Laterality: N/A;  . MASS EXCISION Right 08/10/2019   Procedure: EXCISION OF CYST FROM RIGHT SCALP;  Surgeon: Jovita Kussmaul, MD;  Location: [place];  Service: General;  Laterality: Right;     Current Facility-Administered Medications  Medication Dose Route Frequency Provider Last Rate Last Admin  . 0.9 %  sodium chloride infusion   Intravenous Continuous Earlean Fidalgo, Ocie Doyne, MD        Allergies:   Other and Statins   Social History:  The patient  reports that he quit smoking about 28 years ago. His smoking use included cigarettes. He has a 60.00 pack-year smoking history. He has never used smokeless tobacco. He reports that he does not drink alcohol or use drugs.   Family History:  The patient's family history includes Heart attack in his father and mother; Heart disease in his father and mother.   ROS:  Please see the history of present illness.   Otherwise, review of systems is positive for none.   All other systems are reviewed and negative.   PHYSICAL EXAM: VS:  BP (!) 161/71   Pulse (!) 43   Temp 98.1 F (36.7 C) (Skin)   Resp 17   Ht 5\' 10"  (1.778 m)   Wt 95.3 kg   SpO2 97%   BMI 30.13 kg/m  , BMI Body mass index is 30.13 kg/m. GEN: Well nourished, well developed, in no acute distress  HEENT: normal  Neck: no JVD, carotid bruits, or masses Cardiac: RRR; no murmurs, rubs, or gallops,no edema  Respiratory:  clear to auscultation bilaterally, normal work of breathing GI: soft, nontender, nondistended, +  BS MS: no deformity or atrophy  Skin: warm and dry,  Neuro:  Strength and sensation are intact Psych: euthymic mood, full affect   Recent Labs: 07/11/2019: TSH 0.867 03/19/2020: BUN 40; Creatinine, Ser 1.74; Hemoglobin 10.8; Platelets 183; Potassium 5.4; Sodium 138    Lipid Panel     Component Value Date/Time   CHOL 114 11/29/2018 0223   TRIG 32 11/29/2018 0223   HDL 24 (L) 11/29/2018 0223   CHOLHDL 4.8 11/29/2018 0223   VLDL 6 11/29/2018 0223   LDLCALC 84 11/29/2018 0223     Wt Readings from Last 3 Encounters:  03/19/20 95.3 kg  02/18/20 93.3 kg  02/06/20 94.3 kg      Other studies Reviewed: Additional studies/ records that were reviewed today include: TTE 01/21/20 Review of the above records today demonstrates:  1. Left ventricular ejection fraction, by estimation, is 55 to 60%. The  left ventricle has normal function. Left ventricular endocardial border  not optimally defined to evaluate regional wall motion. There is mild left  ventricular hypertrophy. Left  ventricular diastolic parameters are consistent with Grade II diastolic  dysfunction (pseudonormalization).  2. Right ventricular systolic function is normal. The right ventricular  size is mildly enlarged. There is mildly elevated pulmonary artery  systolic pressure.  3. Left atrial size was moderately dilated.  4. Right atrial size was mildly dilated.  5. The mitral valve is normal in structure. Mild mitral valve  regurgitation.  6. The aortic valve is tricuspid. Aortic valve regurgitation is mild.  Mild aortic valve sclerosis is present, with no evidence of aortic valve  stenosis.  7. The inferior vena cava is normal in size with greater than 50%  respiratory variability, suggesting right atrial pressure of 3 mmHg.   SPECT 11/08/18  The left ventricular ejection fraction is normal (55-65%).  Nuclear stress EF: 56%. Post CABG septal wall hypokinesis.  There was no ST segment deviation noted  during stress.  There is mildly reduced radiotracer uptake at both rest and stress in the anterior, lateral and inferolateral distributions. No ischemia identified. (POST CABG)  This is a low risk study.  ASSESSMENT AND PLAN:  1.  Persistent atrial fibrillation: Plan ablation today  JAMONI HEWES has presented today for surgery, with the diagnosis of AF.  The various methods of treatment have been discussed with the patient and family. After consideration of risks, benefits and other options for treatment, the patient has consented to  Procedure(s): Catheter ablation as a surgical intervention .  Risks include but not limited to bleeding, tamponade, heart block, stroke, damage to surrounding organs, among others. The patient's history has been reviewed, patient examined, no change in status, stable for surgery.  I have reviewed the patient's chart and labs.  Questions were answered to the patient's satisfaction.    Kycen Spalla Curt Bears, MD 03/19/2020 12:18 PM

## 2020-03-19 NOTE — Discharge Instructions (Signed)
Cardiac Ablation, Care After This sheet gives you information about how to care for yourself after your procedure. Your health care provider may also give you more specific instructions. If you have problems or questions, contact your health care provider. What can I expect after the procedure? After the procedure, it is common to have:  Bruising around your puncture site.  Tenderness around your puncture site.  Skipped heartbeats.  Tiredness (fatigue). Follow these instructions at home: Puncture site care   Follow instructions from your health care provider about how to take care of your puncture site. Make sure you: ? Wash your hands with soap and water before you change your bandage (dressing). If soap and water are not available, use hand sanitizer. ? Change your dressing as told by your health care provider. ? Leave stitches (sutures), skin glue, or adhesive strips in place. These skin closures may need to stay in place for up to 2 weeks. If adhesive strip edges start to loosen and curl up, you may trim the loose edges. Do not remove adhesive strips completely unless your health care provider tells you to do that.  Check your puncture site every day for signs of infection. Check for: ? Redness, swelling, or pain. ? Fluid or blood. If your puncture site starts to bleed, lie down on your back, apply firm pressure to the area, and contact your health care provider. ? Warmth. ? Pus or a bad smell. Driving  Ask your health care provider when it is safe for you to drive again after the procedure.  Do not drive or use heavy machinery while taking prescription pain medicine.  Do not drive for 24 hours if you were given a medicine to help you relax (sedative) during your procedure. Activity  Avoid activities that take a lot of effort for at least 3 days after your procedure.  Do not lift anything that is heavier than 10 lb (4.5 kg), or the limit that you are told, until your health  care provider says that it is safe.  Return to your normal activities as told by your health care provider. Ask your health care provider what activities are safe for you. General instructions  Take over-the-counter and prescription medicines only as told by your health care provider.  Do not use any products that contain nicotine or tobacco, such as cigarettes and e-cigarettes. If you need help quitting, ask your health care provider.  Do not take baths, swim, or use a hot tub until your health care provider approves.  Do not drink alcohol for 24 hours after your procedure.  Keep all follow-up visits as told by your health care provider. This is important. Contact a health care provider if:  You have redness, mild swelling, or pain around your puncture site.  You have fluid or blood coming from your puncture site that stops after applying firm pressure to the area.  Your puncture site feels warm to the touch.  You have pus or a bad smell coming from your puncture site.  You have a fever.  You have chest pain or discomfort that spreads to your neck, jaw, or arm.  You are sweating a lot.  You feel nauseous.  You have a fast or irregular heartbeat.  You have shortness of breath.  You are dizzy or light-headed and feel the need to lie down.  You have pain or numbness in the arm or leg closest to your puncture site. Get help right away if:  Your puncture   site suddenly swells.  Your puncture site is bleeding and the bleeding does not stop after applying firm pressure to the area. These symptoms may represent a serious problem that is an emergency. Do not wait to see if the symptoms will go away. Get medical help right away. Call your local emergency services (911 in the [place] Do not drive yourself to the hospital. Summary  After the procedure, it is normal to have bruising and tenderness at the puncture site in your groin, neck, or forearm.  Check your puncture site every  day for signs of infection.  Get help right away if your puncture site is bleeding and the bleeding does not stop after applying firm pressure to the area. This is a medical emergency. This information is not intended to replace advice given to you by your health care provider. Make sure you discuss any questions you have with your health care provider. Document Revised: 09/09/2017 Document Reviewed: 01/06/2017 Elsevier Patient Education  2020 Elsevier Inc.  

## 2020-03-20 ENCOUNTER — Encounter (HOSPITAL_COMMUNITY): Payer: Self-pay | Admitting: Cardiology

## 2020-03-23 ENCOUNTER — Other Ambulatory Visit: Payer: Self-pay | Admitting: Internal Medicine

## 2020-03-24 NOTE — Telephone Encounter (Signed)
02/06/2020 Zolpidem Tart Er 12.5 Mg Tab 30#  Last ov 02/06/20 Next ov 06/03/20

## 2020-04-15 ENCOUNTER — Other Ambulatory Visit: Payer: Self-pay

## 2020-04-15 ENCOUNTER — Encounter (HOSPITAL_COMMUNITY): Payer: Self-pay | Admitting: Physician Assistant

## 2020-04-15 ENCOUNTER — Ambulatory Visit (HOSPITAL_COMMUNITY)
Admission: RE | Admit: 2020-04-15 | Discharge: 2020-04-15 | Disposition: A | Discharge status: Home / Self Care | Payer: PPO | Source: Ambulatory Visit | Attending: Physician Assistant | Admitting: Physician Assistant

## 2020-04-15 VITALS — BP 130/60 | HR 49 | Ht 70.0 in | Wt 211.2 lb

## 2020-04-15 DIAGNOSIS — E785 Hyperlipidemia, unspecified: Secondary | ICD-10-CM | POA: Insufficient documentation

## 2020-04-15 DIAGNOSIS — Z79899 Other long term (current) drug therapy: Secondary | ICD-10-CM | POA: Diagnosis not present

## 2020-04-15 DIAGNOSIS — Z888 Allergy status to other drugs, medicaments and biological substances status: Secondary | ICD-10-CM | POA: Diagnosis not present

## 2020-04-15 DIAGNOSIS — Z951 Presence of aortocoronary bypass graft: Secondary | ICD-10-CM | POA: Insufficient documentation

## 2020-04-15 DIAGNOSIS — I4819 Other persistent atrial fibrillation: Secondary | ICD-10-CM | POA: Insufficient documentation

## 2020-04-15 DIAGNOSIS — I10 Essential (primary) hypertension: Secondary | ICD-10-CM | POA: Insufficient documentation

## 2020-04-15 DIAGNOSIS — I358 Other nonrheumatic aortic valve disorders: Secondary | ICD-10-CM | POA: Diagnosis not present

## 2020-04-15 DIAGNOSIS — Z87891 Personal history of nicotine dependence: Secondary | ICD-10-CM | POA: Diagnosis not present

## 2020-04-15 DIAGNOSIS — I25810 Atherosclerosis of coronary artery bypass graft(s) without angina pectoris: Secondary | ICD-10-CM | POA: Insufficient documentation

## 2020-04-15 DIAGNOSIS — Z7901 Long term (current) use of anticoagulants: Secondary | ICD-10-CM | POA: Insufficient documentation

## 2020-04-15 DIAGNOSIS — I252 Old myocardial infarction: Secondary | ICD-10-CM | POA: Insufficient documentation

## 2020-04-15 DIAGNOSIS — G4733 Obstructive sleep apnea (adult) (pediatric): Secondary | ICD-10-CM | POA: Insufficient documentation

## 2020-04-15 DIAGNOSIS — Z6830 Body mass index (BMI) 30.0-30.9, adult: Secondary | ICD-10-CM | POA: Diagnosis not present

## 2020-04-15 DIAGNOSIS — E669 Obesity, unspecified: Secondary | ICD-10-CM | POA: Diagnosis not present

## 2020-04-15 DIAGNOSIS — D6869 Other thrombophilia: Secondary | ICD-10-CM | POA: Insufficient documentation

## 2020-04-15 LAB — COMPREHENSIVE METABOLIC PANEL
ALT: 18 U/L (ref 0–44)
AST: 18 U/L (ref 15–41)
Albumin: 3.8 g/dL (ref 3.5–5.0)
Alkaline Phosphatase: 42 U/L (ref 38–126)
Anion gap: 9 (ref 5–15)
BUN: 26 mg/dL — ABNORMAL HIGH (ref 8–23)
CO2: 25 mmol/L (ref 22–32)
Calcium: 9.4 mg/dL (ref 8.9–10.3)
Chloride: 104 mmol/L (ref 98–111)
Creatinine, Ser: 1.26 mg/dL — ABNORMAL HIGH (ref 0.61–1.24)
GFR calc Af Amer: >60 mL/min (ref >60)
GFR calc non Af Amer: 58 mL/min — ABNORMAL LOW (ref >60)
Glucose, Bld: 95 mg/dL (ref 70–99)
Potassium: 4.8 mmol/L (ref 3.5–5.1)
Sodium: 138 mmol/L (ref 135–145)
Total Bilirubin: 0.8 mg/dL (ref 0.3–1.2)
Total Protein: 6.4 g/dL — ABNORMAL LOW (ref 6.5–8.1)

## 2020-04-15 LAB — TSH: TSH: 1.233 u[IU]/mL (ref 0.350–4.500)

## 2020-04-15 NOTE — Progress Notes (Signed)
Primary Care Physician: Hoyt Koch, MD Primary Cardiologist: Dr Johnsie Cancel Primary Electrophysiologist: Dr Curt Bears Referring Physician: Dr Tonny Bollman is a 69 y.o. male with a history of coronary artery disease status post three-vessel CABG, hypertension, hyperlipidemia, and atrial fibrillation. He was found to be in atrial fibrillation incidentally on office visit 08/30/2017. He was put on Eliquis at the time. Beta-blockers were not started due to resting bradycardia. January 2020 he was noted increasing dyspnea on exertion. He had a cardioversion 11/28/18 and was started on amiodarone. He does report today that he was diagnosed with OSA a few years ago but could not tolerate CPAP therapy. He is followed by Dr Redmond Baseman. He is on Eliquis for a CHADS2VASC score of 3.  On follow up today, patient is s/p afib ablation 03/19/20. He reports that he has done well since his procedure. He denies heart racing or palpitations. He also has not had further dizziness or presyncope. He denies CP, swallowing, or groin issues.   Today, he denies symptoms of palpitations, chest pain, shortness of breath, orthopnea, PND, lower extremity edema, dizziness, presyncope, syncope, snoring, daytime somnolence, bleeding, or neurologic sequela. The patient is tolerating medications without difficulties and is otherwise without complaint today.    Atrial Fibrillation Risk Factors:  he does have symptoms or diagnosis of sleep apnea. he is not on CPAP therapy. he does not have a history of rheumatic fever. he does not have a history of alcohol use. The patient does not have a history of early familial atrial fibrillation or other arrhythmias.  he has a BMI of Body mass index is 30.3 kg/m.Marland Kitchen Filed Weights   04/15/20 0936  Weight: 95.8 kg    Family History  Problem Relation Age of Onset  . Heart attack Mother   . Heart disease Mother   . Heart attack Father   . Heart disease Father   .  Rectal cancer Neg Hx   . Stomach cancer Neg Hx   . Pancreatic cancer Neg Hx   . Esophageal cancer Neg Hx   . Colon polyps Neg Hx   . Colon cancer Neg Hx      Atrial Fibrillation Management history:  Previous antiarrhythmic drugs: amiodarone Previous cardioversions: 11/2018 Previous ablations: 03/19/20 CHADS2VASC score: 3 Anticoagulation history: Eliquis   Past Medical History:  Diagnosis Date  . Aortic aneurysm ([place]) Endograf 2009  . Arthritis   . Atrial fibrillation ([place])   . Bradycardia   . CAD (coronary artery disease) CABG 03/2000   Median sternotomy for coronary artery bypass grafting x 3 (left  . HTN (hypertension)   . Hx of CABG 2001   severe 3 vessel disease  . Hx of colonic polyp 06/2010   Hyperplastic   . Hyperlipidemia   . Myocardial infarction ([place])   . Sleep apnea    Past Surgical History:  Procedure Laterality Date  . ATRIAL FIBRILLATION ABLATION N/A 03/19/2020   Procedure: ATRIAL FIBRILLATION ABLATION;  Surgeon: Constance Haw, MD;  Location: [place] CV LAB;  Service: Cardiovascular;  Laterality: N/A;  . CARDIOVERSION N/A 11/28/2018   Procedure: CARDIOVERSION;  Surgeon: Elouise Munroe, MD;  Location: [facility] ENDOSCOPY;  Service: Cardiovascular;  Laterality: N/A;  . COLONOSCOPY    . CORONARY ARTERY BYPASS GRAFT     AAA stent graft on 12/01/2007 at [facility]  coronary bypass in 2001. Dr. Roxy Manns.  Marland Kitchen DRUG INDUCED ENDOSCOPY Right 02/18/2020   Procedure: DRUG INDUCED ENDOSCOPY;  Surgeon: Melida Quitter, MD;  Location: [place];  Service: ENT;  Laterality: Right;  . FOOT SURGERY Left 07/19/2017  . LEFT HEART CATHETERIZATION WITH CORONARY ANGIOGRAM N/A 10/26/2013   Procedure: LEFT HEART CATHETERIZATION WITH CORONARY ANGIOGRAM;  Surgeon: Peter M Martinique, MD;  Location: [facility] CATH LAB;  Service: Cardiovascular;  Laterality: N/A;  . MASS EXCISION Right 08/10/2019   Procedure: EXCISION OF CYST FROM RIGHT SCALP;  Surgeon: Jovita Kussmaul, MD;  Location: [place];  Service: General;  Laterality: Right;    Current Outpatient Medications  Medication Sig Dispense Refill  . amiodarone (PACERONE) 200 MG tablet Take 200 mg by mouth daily.    Marland Kitchen apixaban (ELIQUIS) 5 MG TABS tablet Take 1 tablet (5 mg total) by mouth 2 (two) times daily. 180 tablet 1  . aspirin EC 81 MG EC tablet Take 1 tablet (81 mg total) by mouth daily.    . Biotin 10000 MCG TABS Take 10,000 mcg by mouth daily.    . cholecalciferol (VITAMIN D) 25 MCG (1000 UT) tablet Take 1,000 Units by mouth daily.     . diclofenac sodium (VOLTAREN) 1 % GEL Apply 2 g topically 4 (four) times daily. 100 g 3  . divalproex (DEPAKOTE ER) 500 MG 24 hr tablet TAKE 1 TABLET(500 MG) BY MOUTH DAILY 90 tablet 1  . doxycycline (VIBRAMYCIN) 50 MG capsule Take 50 mg by mouth 2 (two) times daily.    Marland Kitchen ezetimibe (ZETIA) 10 MG tablet Take 1 tablet (10 mg total) by mouth daily. 90 tablet 3  . furosemide (LASIX) 20 MG tablet TAKE 1 TABLET(20 MG) BY MOUTH TWICE DAILY 180 tablet 1  . gabapentin (NEURONTIN) 300 MG capsule TAKE 1 CAPSULE(300 MG) BY MOUTH TWICE DAILY 60 capsule 3  . Glucosamine-Chondroitin (GLUCOSAMINE CHONDR COMPLEX PO) Take 500 mg by mouth 2 (two) times daily.     . hydrALAZINE (APRESOLINE) 50 MG tablet TAKE 1 TABLET(50 MG) BY MOUTH TWICE DAILY 180 tablet 3  . isosorbide mononitrate (IMDUR) 30 MG 24 hr tablet TAKE 1 TABLET BY MOUTH EVERY DAY 90 tablet 2  . levocetirizine (XYZAL) 5 MG tablet Take 1 tablet (5 mg total) by mouth every evening. 90 tablet 3  . montelukast (SINGULAIR) 10 MG tablet Take 1 tablet (10 mg total) by mouth at bedtime. 30 tablet 3  . Multiple Vitamin (MULTIVITAMIN) capsule Take 1 capsule by mouth daily.    . niacin (NIASPAN) 1000 MG CR tablet Take 1,000 mg by mouth 2 (two) times daily.      . nitroGLYCERIN (NITROSTAT) 0.4 MG SL tablet DISSOLVE 1 TABLET UNDER THE TONGUE EVERY 5 MINUTES FOR 3 DOSES AS NEEDED FOR CHEST PAIN 25 tablet 6  . Omega-3  Fatty Acids (FISH OIL) 1200 MG CAPS Take 1,200 mg by mouth 2 (two) times daily.    . potassium chloride SA (KLOR-CON) 20 MEQ tablet TAKE 1 TABLET BY MOUTH EVERY DAY 90 tablet 3  . ramipril (ALTACE) 10 MG capsule TAKE 1 CAPSULE BY MOUTH TWICE DAILY 180 capsule 3  . tamsulosin (FLOMAX) 0.4 MG CAPS capsule TAKE 1 CAPSULE(0.4 MG) BY MOUTH DAILY Annual appt is due w/labs must see provider for future refills 30 capsule 0  . tiZANidine (ZANAFLEX) 4 MG tablet Take 1 tablet (4 mg total) by mouth every 8 (eight) hours as needed for muscle spasms. 270 tablet 3  . vitamin C (ASCORBIC ACID) 500 MG tablet Take 500 mg by mouth daily.    Marland Kitchen zolpidem (AMBIEN  CR) 12.5 MG CR tablet TAKE 1 TABLET(12.5 MG) BY MOUTH AT BEDTIME AS NEEDED FOR SLEEP 30 tablet 5   No current facility-administered medications for this encounter.    Allergies  Allergen Reactions  . Other Other (See Comments)    Myalgias with elevated CPKs  . Statins     Myalgias with elevated CPKs    Social History   Socioeconomic History  . Marital status: Married    Spouse name: Not on file  . Number of children: Not on file  . Years of education: Not on file  . Highest education level: Not on file  Occupational History  . Not on file  Tobacco Use  . Smoking status: Former Smoker    Packs/day: 2.00    Years: 30.00    Pack years: 60.00    Types: Cigarettes    Quit date: 01/05/1992    Years since quitting: 28.2  . Smokeless tobacco: Never Used  . Tobacco comment: quit 19 yrs ago  Vaping Use  . Vaping Use: Never used  Substance and Sexual Activity  . Alcohol use: No  . Drug use: No  . Sexual activity: Not on file  Other Topics Concern  . 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   Social Determinants of Health   Financial Resource Strain:   . Difficulty of Paying Living Expenses:   Food Insecurity:   . Worried About Charity fundraiser in the Last Year:    . Arboriculturist in the Last Year:   Transportation Needs:   . Film/video editor (Medical):   Marland Kitchen Lack of Transportation (Non-Medical):   Physical Activity:   . Days of Exercise per Week:   . Minutes of Exercise per Session:   Stress:   . Feeling of Stress :   Social Connections:   . Frequency of Communication with Friends and Family:   . Frequency of Social Gatherings with Friends and Family:   . Attends Religious Services:   . Active Member of Clubs or Organizations:   . Attends Archivist Meetings:   Marland Kitchen Marital Status:   Intimate Partner Violence:   . Fear of Current or Ex-Partner:   . Emotionally Abused:   Marland Kitchen Physically Abused:   . Sexually Abused:      ROS- All systems are reviewed and negative except as per the HPI above.  Physical Exam: Vitals:   04/15/20 0936  BP: 130/60  Pulse: (!) 49  Weight: 95.8 kg  Height: 5\' 10"  (1.778 m)    GEN- The patient is well appearing obese male, alert and oriented x 3 today.   HEENT-head normocephalic, atraumatic, sclera clear, conjunctiva pink, hearing intact, trachea midline. Lungs- Clear to ausculation bilaterally, normal work of breathing Heart- Regular rate and rhythm, bradycardia, no murmurs, rubs or gallops  GI- soft, NT, ND, + BS Extremities- no clubbing, cyanosis, or edema MS- no significant deformity or atrophy Skin- no rash or lesion Psych- euthymic mood, full affect Neuro- strength and sensation are intact   Wt Readings from Last 3 Encounters:  04/15/20 95.8 kg  03/19/20 95.3 kg  02/18/20 93.3 kg    EKG today demonstrates SB HR 49, PR 176, QRS 108, QTc 415  Echo 01/21/20 demonstrated  1. Left ventricular ejection fraction, by estimation, is 55 to 60%. The  left ventricle has normal function. Left ventricular endocardial border  not  optimally defined to evaluate regional wall motion. There is mild left  ventricular hypertrophy. Left  ventricular diastolic parameters are consistent with Grade  II diastolic  dysfunction (pseudonormalization).  2. Right ventricular systolic function is normal. The right ventricular  size is mildly enlarged. There is mildly elevated pulmonary artery  systolic pressure.  3. Left atrial size was moderately dilated.  4. Right atrial size was mildly dilated.  5. The mitral valve is normal in structure. Mild mitral valve  regurgitation.  6. The aortic valve is tricuspid. Aortic valve regurgitation is mild.  Mild aortic valve sclerosis is present, with no evidence of aortic valve  stenosis.  7. The inferior vena cava is normal in size with greater than 50%  respiratory variability, suggesting right atrial pressure of 3 mmHg.   Epic records are reviewed at length today  CHA2DS2-VASc Score = 3  The patient's score is based upon: CHF History: 0 HTN History: 1 Age : 1 Diabetes History: 0 Stroke History: 0 Vascular Disease History: 1 Gender: 0      ASSESSMENT AND PLAN: 1. Persistent Atrial Fibrillation (ICD10:  I48.19) The patient's CHA2DS2-VASc score is 3, indicating a 3.2% annual risk of stroke.   S/p afib ablation 03/19/20 with Dr Curt Bears. Patient appears to be maintaining SR. Continue amiodarone 200 mg daily for now.  Continue Eliquis 5 mg BID with no missed doses for at least 3 months post ablation.   2. Secondary Hypercoagulable State (ICD10:  D68.69) The patient is at significant risk for stroke/thromboembolism based upon his CHA2DS2-VASc Score of 3.  Continue Apixaban (Eliquis).   3. HTN Stable, no changes today.  4. Obstructive sleep apnea Followed by Dr Redmond Baseman.  5. CAD S/p CABG. No anginal symptoms.     Follow up with Dr Curt Bears as scheduled.    [facility] [address] [place], [place] [address] [phone] 04/15/2020 9:53 AM

## 2020-04-16 ENCOUNTER — Other Ambulatory Visit: Payer: Self-pay

## 2020-04-16 ENCOUNTER — Ambulatory Visit (INDEPENDENT_AMBULATORY_CARE_PROVIDER_SITE_OTHER): Payer: PPO

## 2020-04-16 DIAGNOSIS — Z0000 Encounter for general adult medical examination without abnormal findings: Secondary | ICD-10-CM | POA: Diagnosis not present

## 2020-04-16 MED ORDER — APIXABAN 5 MG PO TABS: 5.0000 mg | ORAL_TABLET | Freq: Two times a day (BID) | ORAL | 2 refills | Status: DC

## 2020-04-16 NOTE — Patient Instructions (Addendum)
Mr. [name] Brown , Thank you for taking time to come for your Medicare Wellness Visit. I appreciate your ongoing commitment to your health goals. Please review the following plan we discussed and let me know if I can assist you in the future.   Screening recommendations/referrals: Colonoscopy: 06/08/2018; due every 3 years Recommended yearly ophthalmology/optometry visit for glaucoma screening and checkup Recommended yearly dental visit for hygiene and checkup  Vaccinations: Influenza vaccine: up to date Pneumococcal vaccine: completed Tdap vaccine: up to date; due every 10 years Shingles vaccine: completed   Covid-19: completed  Advanced directives: Please bring a copy of your health care power of attorney and living will to the office at your convenience.  Conditions/risks identified: Please continue to do your personal lifestyle choices by: daily care of teeth and gums, regular physical activity (goal should be 5 days a week for 30 minutes), eat a healthy diet, avoid tobacco and drug use, limiting any alcohol intake, taking a low-dose aspirin (if not allergic or have been advised by your provider otherwise) and taking vitamins and minerals as recommended by your provider. Continue doing brain stimulating activities (puzzles, reading, adult coloring books, staying active) to keep memory sharp. Continue to eat heart healthy diet (full of fruits, vegetables, whole grains, lean protein, water--limit salt, fat, and sugar intake) and increase physical activity as tolerated.  Next appointment: Please schedule your next Medicare Wellness Visit with your Nurse Health Advisor in 1 year.  Preventive Care 68 Years and Older, Male Preventive care refers to lifestyle choices and visits with your health care provider that can promote health and wellness. What does preventive care include?  A yearly physical exam. This is also called an annual well check.  Dental exams once or twice a year.  Routine eye  exams. Ask your health care provider how often you should have your eyes checked.  Personal lifestyle choices, including:  Daily care of your teeth and gums.  Regular physical activity.  Eating a healthy diet.  Avoiding tobacco and drug use.  Limiting alcohol use.  Practicing safe sex.  Taking low doses of aspirin every day.  Taking vitamin and mineral supplements as recommended by your health care provider. What happens during an annual well check? The services and screenings done by your health care provider during your annual well check will depend on your age, overall health, lifestyle risk factors, and family history of disease. Counseling  Your health care provider may ask you questions about your:  Alcohol use.  Tobacco use.  Drug use.  Emotional well-being.  Home and relationship well-being.  Sexual activity.  Eating habits.  History of falls.  Memory and ability to understand (cognition).  Work and work Statistician. Screening  You may have the following tests or measurements:  Height, weight, and BMI.  Blood pressure.  Lipid and cholesterol levels. These may be checked every 5 years, or more frequently if you are over 28 years old.  Skin check.  Lung cancer screening. You may have this screening every year starting at age 13 if you have a 30-pack-year history of smoking and currently smoke or have quit within the past 15 years.  Fecal occult blood test (FOBT) of the stool. You may have this test every year starting at age 12.  Flexible sigmoidoscopy or colonoscopy. You may have a sigmoidoscopy every 5 years or a colonoscopy every 10 years starting at age 75.  Prostate cancer screening. Recommendations will vary depending on your family history and other risks.  Hepatitis C blood test.  Hepatitis B blood test.  Sexually transmitted disease (STD) testing.  Diabetes screening. This is done by checking your blood sugar (glucose) after you have  not eaten for a while (fasting). You may have this done every 1-3 years.  Abdominal aortic aneurysm (AAA) screening. You may need this if you are a current or former smoker.  Osteoporosis. You may be screened starting at age 47 if you are at high risk. Talk with your health care provider about your test results, treatment options, and if necessary, the need for more tests. Vaccines  Your health care provider may recommend certain vaccines, such as:  Influenza vaccine. This is recommended every year.  Tetanus, diphtheria, and acellular pertussis (Tdap, Td) vaccine. You may need a Td booster every 10 years.  Zoster vaccine. You may need this after age 30.  Pneumococcal 13-valent conjugate (PCV13) vaccine. One dose is recommended after age 75.  Pneumococcal polysaccharide (PPSV23) vaccine. One dose is recommended after age 99. Talk to your health care provider about which screenings and vaccines you need and how often you need them. This information is not intended to replace advice given to you by your health care provider. Make sure you discuss any questions you have with your health care provider. Document Released: 10/24/2015 Document Revised: 06/16/2016 Document Reviewed: 07/29/2015 Elsevier Interactive Patient Education  2017 [place] Prevention in the Home Falls can cause injuries. They can happen to people of all ages. There are many things you can do to make your home safe and to help prevent falls. What can I do on the outside of my home?  Regularly fix the edges of walkways and driveways and fix any cracks.  Remove anything that might make you trip as you walk through a door, such as a raised step or threshold.  Trim any bushes or trees on the path to your home.  Use bright outdoor lighting.  Clear any walking paths of anything that might make someone trip, such as rocks or tools.  Regularly check to see if handrails are loose or broken. Make sure that both  sides of any steps have handrails.  Any raised decks and porches should have guardrails on the edges.  Have any leaves, snow, or ice cleared regularly.  Use sand or salt on walking paths during winter.  Clean up any spills in your garage right away. This includes oil or grease spills. What can I do in the bathroom?  Use night lights.  Install grab bars by the toilet and in the tub and shower. Do not use towel bars as grab bars.  Use non-skid mats or decals in the tub or shower.  If you need to sit down in the shower, use a plastic, non-slip stool.  Keep the floor dry. Clean up any water that spills on the floor as soon as it happens.  Remove soap buildup in the tub or shower regularly.  Attach bath mats securely with double-sided non-slip rug tape.  Do not have throw rugs and other things on the floor that can make you trip. What can I do in the bedroom?  Use night lights.  Make sure that you have a light by your bed that is easy to reach.  Do not use any sheets or blankets that are too big for your bed. They should not hang down onto the floor.  Have a firm chair that has side arms. You can use this for support while you  get dressed.  Do not have throw rugs and other things on the floor that can make you trip. What can I do in the kitchen?  Clean up any spills right away.  Avoid walking on wet floors.  Keep items that you use a lot in easy-to-reach places.  If you need to reach something above you, use a strong step stool that has a grab bar.  Keep electrical cords out of the way.  Do not use floor polish or wax that makes floors slippery. If you must use wax, use non-skid floor wax.  Do not have throw rugs and other things on the floor that can make you trip. What can I do with my stairs?  Do not leave any items on the stairs.  Make sure that there are handrails on both sides of the stairs and use them. Fix handrails that are broken or loose. Make sure that  handrails are as long as the stairways.  Check any carpeting to make sure that it is firmly attached to the stairs. Fix any carpet that is loose or worn.  Avoid having throw rugs at the top or bottom of the stairs. If you do have throw rugs, attach them to the floor with carpet tape.  Make sure that you have a light switch at the top of the stairs and the bottom of the stairs. If you do not have them, ask someone to add them for you. What else can I do to help prevent falls?  Wear shoes that:  Do not have high heels.  Have rubber bottoms.  Are comfortable and fit you well.  Are closed at the toe. Do not wear sandals.  If you use a stepladder:  Make sure that it is fully opened. Do not climb a closed stepladder.  Make sure that both sides of the stepladder are locked into place.  Ask someone to hold it for you, if possible.  Clearly mark and make sure that you can see:  Any grab bars or handrails.  First and last steps.  Where the edge of each step is.  Use tools that help you move around (mobility aids) if they are needed. These include:  Canes.  Walkers.  Scooters.  Crutches.  Turn on the lights when you go into a dark area. Replace any light bulbs as soon as they burn out.  Set up your furniture so you have a clear path. Avoid moving your furniture around.  If any of your floors are uneven, fix them.  If there are any pets around you, be aware of where they are.  Review your medicines with your doctor. Some medicines can make you feel dizzy. This can increase your chance of falling. Ask your doctor what other things that you can do to help prevent falls. This information is not intended to replace advice given to you by your health care provider. Make sure you discuss any questions you have with your health care provider. Document Released: 07/24/2009 Document Revised: 03/04/2016 Document Reviewed: 11/01/2014 Elsevier Interactive Patient Education  2017  [employer].

## 2020-04-16 NOTE — Telephone Encounter (Signed)
Eliquis 5mg  refill request received. Patient is 69 years old, weight-95.8kg, Crea-1.26 on 04/15/2020, Diagnosis-Afib, and last seen by Malka So on 04/15/2020. Dose is appropriate based on dosing criteria. Will send in refill to requested pharmacy.

## 2020-04-16 NOTE — Progress Notes (Signed)
I connected with [name] Brown today by telephone and verified that I am speaking with the correct person using two identifiers. Location patient: home Location provider: work Persons participating in the virtual visit: Saba Neuman and Lisette Abu, LPN   I discussed the limitations, risks, security and privacy concerns of performing an evaluation and management service by telephone and the availability of in person appointments. I also discussed with the patient that there may be a patient responsible charge related to this service. The patient expressed understanding and verbally consented to this telephonic visit.    Interactive audio and video telecommunications were attempted between this provider and patient, however failed, due to patient having technical difficulties OR patient did not have access to video capability.  We continued and completed visit with audio only.  Some vital signs may be absent or patient reported.   Time Spent with patient on telephone encounter: 20 minutes  Subjective:   [name] Brown is a 69 y.o. male who presents for Medicare Annual/Subsequent preventive examination.  Review of Systems    No ROS. Medicare Wellness Virtual Visit. Additional risk factors are reflected in social history. Cardiac Risk Factors include: advanced age (>86men, >35 women);dyslipidemia;family history of premature cardiovascular disease;hypertension;male gender;obesity (BMI >30kg/m2)     Objective:    There were no vitals filed for this visit. There is no height or weight on file to calculate BMI.  Advanced Directives 04/16/2020 03/19/2020 02/11/2020 11/13/2019 08/27/2019 08/10/2019 08/06/2019  Does Patient Have a Medical Advance Directive? Yes Yes Yes Yes Yes Yes Yes  Type of Paramedic of [place];Living will [place];Living will - Living will Living will Living will;Healthcare Power of [place];Living  will  Does patient want to make changes to medical advance directive? No - Patient declined - - No - Patient declined No - Patient declined No - Patient declined -  Copy of [place] in Chart? No - copy requested - - - No - copy requested No - copy requested -  Would patient like information on creating a medical advance directive? - - - - - - -    Current Medications (verified) Outpatient Encounter Medications as of 04/16/2020  Medication Sig  . amiodarone (PACERONE) 200 MG tablet Take 200 mg by mouth daily.  Marland Kitchen apixaban (ELIQUIS) 5 MG TABS tablet Take 1 tablet (5 mg total) by mouth 2 (two) times daily.  Marland Kitchen aspirin EC 81 MG EC tablet Take 1 tablet (81 mg total) by mouth daily.  . Biotin 10000 MCG TABS Take 10,000 mcg by mouth daily.  . cholecalciferol (VITAMIN D) 25 MCG (1000 UT) tablet Take 1,000 Units by mouth daily.   . diclofenac sodium (VOLTAREN) 1 % GEL Apply 2 g topically 4 (four) times daily.  . divalproex (DEPAKOTE ER) 500 MG 24 hr tablet TAKE 1 TABLET(500 MG) BY MOUTH DAILY  . doxycycline (VIBRAMYCIN) 50 MG capsule Take 50 mg by mouth 2 (two) times daily.  Marland Kitchen ezetimibe (ZETIA) 10 MG tablet Take 1 tablet (10 mg total) by mouth daily.  . furosemide (LASIX) 20 MG tablet TAKE 1 TABLET(20 MG) BY MOUTH TWICE DAILY  . gabapentin (NEURONTIN) 300 MG capsule TAKE 1 CAPSULE(300 MG) BY MOUTH TWICE DAILY  . Glucosamine-Chondroitin (GLUCOSAMINE CHONDR COMPLEX PO) Take 500 mg by mouth 2 (two) times daily.   . hydrALAZINE (APRESOLINE) 50 MG tablet TAKE 1 TABLET(50 MG) BY MOUTH TWICE DAILY  . isosorbide mononitrate (IMDUR) 30 MG 24  hr tablet TAKE 1 TABLET BY MOUTH EVERY DAY  . levocetirizine (XYZAL) 5 MG tablet Take 1 tablet (5 mg total) by mouth every evening.  . montelukast (SINGULAIR) 10 MG tablet Take 1 tablet (10 mg total) by mouth at bedtime.  . Multiple Vitamin (MULTIVITAMIN) capsule Take 1 capsule by mouth daily.  . niacin (NIASPAN) 1000 MG CR tablet Take 1,000 mg by mouth 2  (two) times daily.    . nitroGLYCERIN (NITROSTAT) 0.4 MG SL tablet DISSOLVE 1 TABLET UNDER THE TONGUE EVERY 5 MINUTES FOR 3 DOSES AS NEEDED FOR CHEST PAIN  . Omega-3 Fatty Acids (FISH OIL) 1200 MG CAPS Take 1,200 mg by mouth 2 (two) times daily.  . potassium chloride SA (KLOR-CON) 20 MEQ tablet TAKE 1 TABLET BY MOUTH EVERY DAY  . ramipril (ALTACE) 10 MG capsule TAKE 1 CAPSULE BY MOUTH TWICE DAILY  . tamsulosin (FLOMAX) 0.4 MG CAPS capsule TAKE 1 CAPSULE(0.4 MG) BY MOUTH DAILY Annual appt is due w/labs must see provider for future refills  . tiZANidine (ZANAFLEX) 4 MG tablet Take 1 tablet (4 mg total) by mouth every 8 (eight) hours as needed for muscle spasms.  . vitamin C (ASCORBIC ACID) 500 MG tablet Take 500 mg by mouth daily.  Marland Kitchen zolpidem (AMBIEN CR) 12.5 MG CR tablet TAKE 1 TABLET(12.5 MG) BY MOUTH AT BEDTIME AS NEEDED FOR SLEEP   No facility-administered encounter medications on file as of 04/16/2020.    Allergies (verified) Other and Statins   History: Past Medical History:  Diagnosis Date  . Aortic aneurysm ([place]) Endograf 2009  . Arthritis   . Atrial fibrillation ([place])   . Bradycardia   . CAD (coronary artery disease) CABG 03/2000   Median sternotomy for coronary artery bypass grafting x 3 (left  . HTN (hypertension)   . Hx of CABG 2001   severe 3 vessel disease  . Hx of colonic polyp 06/2010   Hyperplastic   . Hyperlipidemia   . Myocardial infarction ([place])   . Sleep apnea    Past Surgical History:  Procedure Laterality Date  . ATRIAL FIBRILLATION ABLATION N/A 03/19/2020   Procedure: ATRIAL FIBRILLATION ABLATION;  Surgeon: Constance Haw, MD;  Location: [facility];  Service: Cardiovascular;  Laterality: N/A;  . CARDIOVERSION N/A 11/28/2018   Procedure: CARDIOVERSION;  Surgeon: Elouise Munroe, MD;  Location: [facility] ENDOSCOPY;  Service: Cardiovascular;  Laterality: N/A;  . COLONOSCOPY    . CORONARY ARTERY BYPASS GRAFT     AAA stent graft on 12/01/2007 at [facility]  coronary bypass in 2001. Dr. Roxy Manns.  Marland Kitchen DRUG INDUCED ENDOSCOPY Right 02/18/2020   Procedure: DRUG INDUCED ENDOSCOPY;  Surgeon: Melida Quitter, MD;  Location: [facility];  Service: ENT;  Laterality: Right;  . FOOT SURGERY Left 07/19/2017  . LEFT HEART CATHETERIZATION WITH CORONARY ANGIOGRAM N/A 10/26/2013   Procedure: LEFT HEART CATHETERIZATION WITH CORONARY ANGIOGRAM;  Surgeon: Peter M Martinique, MD;  Location: [facility] CATH LAB;  Service: Cardiovascular;  Laterality: N/A;  . MASS EXCISION Right 08/10/2019   Procedure: EXCISION OF CYST FROM RIGHT SCALP;  Surgeon: Jovita Kussmaul, MD;  Location: [facility];  Service: General;  Laterality: Right;   Family History  Problem Relation Age of Onset  . Heart attack Mother   . Heart disease Mother   . Heart attack Father   . Heart disease Father   . Rectal cancer Neg Hx   . Stomach cancer Neg Hx   .  Pancreatic cancer Neg Hx   . Esophageal cancer Neg Hx   . Colon polyps Neg Hx   . Colon cancer Neg Hx    Social History   Socioeconomic History  . Marital status: Married    Spouse name: Not on file  . Number of children: Not on file  . Years of education: Not on file  . Highest education level: Not on file  Occupational History  . Not on file  Tobacco Use  . Smoking status: Former Smoker    Packs/day: 2.00    Years: 30.00    Pack years: 60.00    Types: Cigarettes    Quit date: 01/05/1992    Years since quitting: 28.2  . Smokeless tobacco: Never Used  . Tobacco comment: quit 19 yrs ago  Vaping Use  . Vaping Use: Never used  Substance and Sexual Activity  . Alcohol use: No  . Drug use: No  . Sexual activity: Not on file  Other Topics Concern  . 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   Social Determinants of Health   Financial Resource Strain: Low Risk   . Difficulty of Paying Living Expenses: Not hard at all    Food Insecurity: No Food Insecurity  . Worried About Charity fundraiser in the Last Year: Never true  . Ran Out of Food in the Last Year: Never true  Transportation Needs: No Transportation Needs  . Lack of Transportation (Medical): No  . Lack of Transportation (Non-Medical): No  Physical Activity: Sufficiently Active  . Days of Exercise per Week: 5 days  . Minutes of Exercise per Session: 30 min  Stress: No Stress Concern Present  . Feeling of Stress : Not at all  Social Connections: Unknown  . Frequency of Communication with Friends and Family: More than three times a week  . Frequency of Social Gatherings with Friends and Family: More than three times a week  . Attends Religious Services: Patient refused  . Active Member of Clubs or Organizations: Patient refused  . Attends Archivist Meetings: Patient refused  . Marital Status: Married    Tobacco Counseling Counseling given: Not Answered Comment: quit 19 yrs ago   Clinical Intake:  Pre-visit preparation completed: Yes  Pain : No/denies pain     Nutritional Risks: None Diabetes: No  How often do you need to have someone help you when you read instructions, pamphlets, or other written materials from your doctor or pharmacy?: 1 - Never What is the last grade level you completed in school?: HSG  Diabetic? no  Interpreter Needed?: No  Information entered by :: Sherra Kimmons N. Delaine Hernandez, LPN   Activities of Daily Living In your present state of health, do you have any difficulty performing the following activities: 04/16/2020  Hearing? N  Vision? N  Difficulty concentrating or making decisions? N  Walking or climbing stairs? N  Dressing or bathing? N  Doing errands, shopping? N  Preparing Food and eating ? N  Using the Toilet? N  In the past six months, have you accidently leaked urine? N  Do you have problems with loss of bowel control? N  Managing your Medications? N  Managing your Finances? N   Housekeeping or managing your Housekeeping? N  Some recent data might be hidden    Patient Care Team: Hoyt Koch, MD as PCP - General (Internal Medicine) [place]  Loletha Grayer, MD as PCP - Cardiology (Cardiology) Constance Haw, MD as PCP - Electrophysiology (Cardiology) Lujean Amel, MD as Attending Physician (Family Medicine) Charlton Haws, [facility] as Pharmacist (Pharmacist)  Indicate any recent Medical Services you may have received from other than [facility] providers in the past year (date may be approximate).     Assessment:   This is a routine wellness examination for [place].  Hearing/Vision screen No exam data present  Dietary issues and exercise activities discussed: Current Exercise Habits: Home exercise routine, Type of exercise: walking (yard work/gardening), Time (Minutes): 30, Frequency (Times/Week): 5, Weekly Exercise (Minutes/Week): 150, Intensity: Moderate, Exercise limited by: cardiac condition(s)  Goals    .  Patient Stated (pt-stated)      My personal goal is to make it to another year.    Marland Kitchen  Pharmacy Care Plan      Current Barriers:  . Chronic Disease Management support, education, and care coordination needs related to CAD, HTN, and HLD  Pharmacist Clinical Goal(s):  Marland Kitchen Maintain BP < 130/80 . Ensure safety, efficacy and affordability of medications  Interventions: . Comprehensive medication review performed. . Pursue patient assistance for Eliquis prior to reaching coverage gap (~August 2021) . Do not exceed 150 mg of trazodone . Monitor for signs and symptoms of bleeding with Eliquis/aspirin  Patient Self Care Activities:  . Self administers medications as prescribed, Calls pharmacy for medication refills, and Calls provider office for new concerns or questions  Initial goal documentation      Depression Screen PHQ 2/9 Scores 04/16/2020 10/24/2018 09/22/2017  PHQ - 2 Score 0 0 0    Fall Risk Fall Risk  04/16/2020 02/06/2020 05/11/2019  10/24/2018 09/22/2017  Falls in the past year? 1 1 0 0 No  Comment - - [place]i Telephone Survey: data to providers prior to load - -  Number falls in past yr: 1 1 - - -  Injury with Fall? 0 1 - - -  Risk for fall due to : History of fall(s) - - - -  Follow up Falls evaluation completed - - - -    Any stairs in or around the home? No  If so, are there any without handrails? No  Home free of loose throw rugs in walkways, pet beds, electrical cords, etc? Yes  Adequate lighting in your home to reduce risk of falls? Yes   ASSISTIVE DEVICES UTILIZED TO PREVENT FALLS:  Life alert? No  Use of a cane, walker or w/c? No  Grab bars in the bathroom? No  Shower chair or bench in shower? No  Elevated toilet seat or a handicapped toilet? No   TIMED UP AND GO:  Was the test performed? No .  Length of time to ambulate 10 feet: 0 sec.   Gait steady and fast without use of assistive device  Cognitive Function:        Immunizations Immunization History  Administered Date(s) Administered  . Influenza, High Dose Seasonal PF 06/13/2019  . Influenza,inj,Quad PF,6+ Mos 07/24/2013, 07/25/2014  . Influenza-Unspecified 07/26/2015, 07/24/2016, 06/25/2017, 07/25/2018, 06/26/2019  . Moderna SARS-COVID-2 Vaccination 11/23/2019, 12/21/2019  . Pneumococcal Conjugate-13 10/25/2018  . Pneumococcal Polysaccharide-23 02/06/2020  . Tdap 10/11/2012  . Zoster 07/24/2013  . Zoster Recombinat (Shingrix) 08/12/2018, 09/13/2018, 11/10/2018    TDAP status: Up to date Flu Vaccine status: Up to date Pneumococcal vaccine status: Up to date Covid-19 vaccine status: Completed vaccines  Qualifies for Shingles Vaccine? Yes   Zostavax completed Yes   Shingrix Completed?: Yes  Screening Tests Health Maintenance  Topic Date Due  . INFLUENZA VACCINE  05/11/2020  . COLONOSCOPY  06/08/2021  . TETANUS/TDAP  10/11/2022  . COVID-19 Vaccine  Completed  . Hepatitis C Screening  Completed  . PNA vac Low Risk Adult   Completed    Health Maintenance  There are no preventive care reminders to display for this patient.  Colorectal cancer screening: Completed 06/08/2018. Repeat every 3 years  Lung Cancer Screening: (Low Dose CT Chest recommended if Age 39-80 years, 30 pack-year currently smoking OR have quit w/in 15years.) does not qualify.   Lung Cancer Screening Referral: no  Additional Screening:  Hepatitis C Screening: does qualify; Completed yes  Vision Screening: Recommended annual ophthalmology exams for early detection of glaucoma and other disorders of the eye. Is the patient up to date with their annual eye exam?  Yes  Who is the provider or what is the name of the office in which the patient attends annual eye exams? Melissa Noon, MD If pt is not established with a provider, would they like to be referred to a provider to establish care? No .   Dental Screening: Recommended annual dental exams for proper oral hygiene  Community Resource Referral / Chronic Care Management: CRR required this visit?  No   CCM required this visit?  No      Plan:     I have personally reviewed and noted the following in the patient's chart:   . Medical and social history . Use of alcohol, tobacco or illicit drugs  . Current medications and supplements . Functional ability and status . Nutritional status . Physical activity . Advanced directives . List of other physicians . Hospitalizations, surgeries, and ER visits in previous 12 months . Vitals . Screenings to include cognitive, depression, and falls . Referrals and appointments  In addition, I have reviewed and discussed with patient certain preventive protocols, quality metrics, and best practice recommendations. A written personalized care plan for preventive services as well as general preventive health recommendations were provided to patient.     Sheral Flow, LPN   02/16/9773   Nurse Notes:  Patient is cogitatively  intact. There were no vitals filed for this visit. There is no height or weight on file to calculate BMI.

## 2020-04-17 ENCOUNTER — Other Ambulatory Visit: Payer: Self-pay | Admitting: Internal Medicine

## 2020-04-17 DIAGNOSIS — R3912 Poor urinary stream: Secondary | ICD-10-CM

## 2020-04-29 ENCOUNTER — Other Ambulatory Visit: Payer: Self-pay | Admitting: Internal Medicine

## 2020-04-29 DIAGNOSIS — G44009 Cluster headache syndrome, unspecified, not intractable: Secondary | ICD-10-CM

## 2020-05-15 ENCOUNTER — Other Ambulatory Visit: Payer: Self-pay

## 2020-05-15 ENCOUNTER — Ambulatory Visit (HOSPITAL_BASED_OUTPATIENT_CLINIC_OR_DEPARTMENT_OTHER): Payer: PPO | Attending: Otolaryngology | Admitting: Internal Medicine

## 2020-05-15 DIAGNOSIS — R0681 Apnea, not elsewhere classified: Secondary | ICD-10-CM | POA: Diagnosis present

## 2020-05-15 DIAGNOSIS — Z79899 Other long term (current) drug therapy: Secondary | ICD-10-CM | POA: Diagnosis not present

## 2020-05-15 DIAGNOSIS — G4733 Obstructive sleep apnea (adult) (pediatric): Secondary | ICD-10-CM | POA: Insufficient documentation

## 2020-05-15 DIAGNOSIS — G4700 Insomnia, unspecified: Secondary | ICD-10-CM | POA: Insufficient documentation

## 2020-05-21 DIAGNOSIS — R0681 Apnea, not elsewhere classified: Secondary | ICD-10-CM | POA: Diagnosis not present

## 2020-05-21 NOTE — Procedures (Signed)
    Patient Name: [name] Brown, [name] Brown Date: 05/15/2020 Gender: Male D.O.B: [birth year] Age (years): 69 Referring Provider: Melida Quitter Height (inches): 13 Interpreting Physician: Baird Lyons MD, ABSM Weight (lbs): 206 RPSGT: Gerhard Perches BMI: 30 MRN: [id number] Neck Size: 15.00  CLINICAL INFORMATION Sleep Study Type: HST Indication for sleep study: Witnessed Apneas Epworth Sleepiness Score: 9  Most recent titration study dated 11/13/2019 revealed an AHI of 2.5/h.  SLEEP STUDY TECHNIQUE A multi-channel overnight portable sleep study was performed. The channels recorded were: nasal airflow, thoracic respiratory movement, and oxygen saturation with a pulse oximetry. Snoring was also monitored.  MEDICATIONS Patient self administered medications include: GABAPENTIN, TRAZODONE, ACETAMINOPHEN, BENADRYL.  SLEEP ARCHITECTURE Patient was studied for 260.7 minutes. The sleep efficiency was 99.2 % and the patient was supine for 0%. The arousal index was 0.0 per hour.  RESPIRATORY PARAMETERS The overall AHI was 59.8 per hour, with a central apnea index of 0.0 per hour. The oxygen nadir was 81% during sleep.  CARDIAC DATA Mean heart rate during sleep was 39.4 bpm.  IMPRESSIONS - Severe obstructive sleep apnea occurred during this study (AHI = 59.8/h). - No significant central sleep apnea occurred during this study (CAI = 0.0/h). - Moderate oxygen desaturation was noted during this study (Min O2 = 81%). Mean sat 91%. - Patient snored.  DIAGNOSIS - Obstructive Sleep Apnea (G47.33)  RECOMMENDATIONS - Consider CPAP, ENT management or possibly a fitted oral appliancee, based on clinical judgment. - Be careful with alcohol, sedatives and other CNS depressants that may worsen sleep apnea and disrupt normal sleep architecture. - Sleep hygiene should be reviewed to assess factors that may improve sleep quality. - Weight management and regular exercise should be initiated or  continued.  [Electronically signed] 05/21/2020 01:48 PM  Baird Lyons MD, [place], American Board of Sleep Medicine   NPI: [id number]                         [place], [place] of Sleep [facility]  ELECTRONICALLY SIGNED ON:  05/21/2020, 1:44 PM [place] PH: [phone]   FX: [phone] [place]

## 2020-06-03 ENCOUNTER — Telehealth: Payer: PPO

## 2020-06-03 ENCOUNTER — Other Ambulatory Visit: Payer: Self-pay

## 2020-06-03 ENCOUNTER — Ambulatory Visit: Payer: PPO | Admitting: Pharmacist

## 2020-06-03 DIAGNOSIS — I25729 Atherosclerosis of autologous artery coronary artery bypass graft(s) with unspecified angina pectoris: Secondary | ICD-10-CM

## 2020-06-03 DIAGNOSIS — G729 Myopathy, unspecified: Secondary | ICD-10-CM

## 2020-06-03 DIAGNOSIS — I10 Essential (primary) hypertension: Secondary | ICD-10-CM

## 2020-06-03 DIAGNOSIS — I4819 Other persistent atrial fibrillation: Secondary | ICD-10-CM

## 2020-06-03 DIAGNOSIS — E785 Hyperlipidemia, unspecified: Secondary | ICD-10-CM

## 2020-06-03 NOTE — Patient Instructions (Addendum)
Visit Information  Phone number for Pharmacist: [phone]  Goals Addressed            This Visit's Progress   . Pharmacy Care Plan       CARE PLAN ENTRY (see longitudinal plan of care for additional care plan information)  Current Barriers:  . Chronic Disease Management support, education, and care coordination needs related to Hypertension, Hyperlipidemia, Atrial Fibrillation, and Coronary Artery Disease   Hypertension BP Readings from Last 3 Encounters:  04/15/20 130/60  03/19/20 (!) 151/58  03/17/20 (!) 145/59 .  Pharmacist Clinical Goal(s): o Over the next 180 days, patient will work with PharmD and providers to maintain BP goal <130/80 . Current regimen:  o ramipril 10 mg twice a day o hydralazine 50 mg twice a day o isosorbide MN 30 mg daily . Interventions: o Discussed BP goals and benefits of medications for prevention of heart attack / stroke . Patient self care activities - Over the next 180 days, patient will: o Check BP 1-2 times weekly, document, and provide at future appointments o Ensure daily salt intake < 2300 mg/day  Hyperlipidemia / Coronary artery disease Lab Results  Component Value Date/Time   LDLCALC 84 11/29/2018 02:23 AM .  Pharmacist Clinical Goal(s): o Over the next 180 days, patient will work with PharmD and providers to achieve LDL goal < 70 . Current regimen:  o ezetimibe 10 mg daily,  o niacin 1000 mg twice a day o isosorbide MN 30 mg daily,  o nitroglycerin 0.4 mg asneeded  o aspirin 81 mg daily o fish oil OTC daily . Interventions: o Discussed cholesterol goals and benefits of medications for prevention of heart attack / stroke . Patient self care activities - Over the next 180 days, patient will: o Continue current medications o Continue low cholesterol diet and exercise routine  Atrial Fibrillation Pulse Readings from Last 3 Encounters:  04/15/20 (!) 49  03/19/20 (!) 54  02/18/20 (!) 59 .  Pharmacist Clinical  Goal(s) o Over the next 30 days, patient will work with PharmD and providers to optimize therapy . Current regimen:  o amiodarone 200 mg daily,  o Eliquis 5 mg twice a day . Interventions: o Pursue patient assistance for Eliquis . Patient self care activities - Over the next 30 days, patient will: o Provide proof of income and out of pocket costs o Complete patient portion of Eliquis application  Medication management . Pharmacist Clinical Goal(s): o Over the next 180 days, patient will work with PharmD and providers to maintain optimal medication adherence . [facility] . Interventions o Comprehensive medication review performed. o Continue current medication management strategy . Patient self care activities - Over the next 180 days, patient will: o Focus on medication adherence by pill box o Take medications as prescribed o Report any questions or concerns to PharmD and/or provider(s)  Please see past updates related to this goal by clicking on the "Past Updates" button in the selected goal       Patient verbalizes understanding of instructions provided today.  The pharmacy team will reach out to the patient again over the next 30 days.   [name] Brown, PharmD, BCACP Clinical Pharmacist [facility] [phone]  Cholesterol Content in Foods Cholesterol is a waxy, fat-like substance that helps to carry fat in the blood. The body needs cholesterol in small amounts, but too much cholesterol can cause damage to the arteries and heart. Most people should  eat less than 200 milligrams (mg) of cholesterol a day. Foods with cholesterol  Cholesterol is found in animal-based foods, such as meat, seafood, and dairy. Generally, low-fat dairy and lean meats have less cholesterol than full-fat dairy and fatty meats. The milligrams of cholesterol per serving (mg per serving) of common cholesterol-containing foods are listed  below. Meat and other proteins  Egg -- one large whole egg has 186 mg.  Veal shank -- 4 oz has 141 mg.  Lean ground [place] (93% lean) -- 4 oz has 118 mg.  Fat-trimmed lamb loin -- 4 oz has 106 mg.  Lean ground beef (90% lean) -- 4 oz has 100 mg.  Lobster -- 3.5 oz has 90 mg.  Pork loin chops -- 4 oz has 86 mg.  Canned salmon -- 3.5 oz has 83 mg.  Fat-trimmed beef top loin -- 4 oz has 78 mg.  Frankfurter -- 1 frank (3.5 oz) has 77 mg.  Crab -- 3.5 oz has 71 mg.  Roasted chicken without skin, white meat -- 4 oz has 66 mg.  Light bologna -- 2 oz has 45 mg.  Deli-cut [place] -- 2 oz has 31 mg.  Canned tuna -- 3.5 oz has 31 mg.  Berniece Salines -- 1 oz has 29 mg.  Oysters and mussels (raw) -- 3.5 oz has 25 mg.  Mackerel -- 1 oz has 22 mg.  Trout -- 1 oz has 20 mg.  Pork sausage -- 1 link (1 oz) has 17 mg.  Salmon -- 1 oz has 16 mg.  Tilapia -- 1 oz has 14 mg. Dairy  Soft-serve ice cream --  cup (4 oz) has 103 mg.  Whole-milk yogurt -- 1 cup (8 oz) has 29 mg.  Cheddar cheese -- 1 oz has 28 mg.  American cheese -- 1 oz has 28 mg.  Whole milk -- 1 cup (8 oz) has 23 mg.  2% milk -- 1 cup (8 oz) has 18 mg.  Cream cheese -- 1 tablespoon (Tbsp) has 15 mg.  Cottage cheese --  cup (4 oz) has 14 mg.  Low-fat (1%) milk -- 1 cup (8 oz) has 10 mg.  Sour cream -- 1 Tbsp has 8.5 mg.  Low-fat yogurt -- 1 cup (8 oz) has 8 mg.  Nonfat Greek yogurt -- 1 cup (8 oz) has 7 mg.  Half-and-half cream -- 1 Tbsp has 5 mg. Fats and oils  Cod liver oil -- 1 tablespoon (Tbsp) has 82 mg.  Butter -- 1 Tbsp has 15 mg.  Lard -- 1 Tbsp has 14 mg.  Bacon grease -- 1 Tbsp has 14 mg.  Mayonnaise -- 1 Tbsp has 5-10 mg.  Margarine -- 1 Tbsp has 3-10 mg. Exact amounts of cholesterol in these foods may vary depending on specific ingredients and brands. Foods without cholesterol Most plant-based foods do not have cholesterol unless you combine them with a food that has cholesterol.  Foods without cholesterol include:  Grains and cereals.  Vegetables.  Fruits.  Vegetable oils, such as olive, canola, and sunflower oil.  Legumes, such as peas, beans, and lentils.  Nuts and seeds.  Egg whites. Summary  The body needs cholesterol in small amounts, but too much cholesterol can cause damage to the arteries and heart.  Most people should eat less than 200 milligrams (mg) of cholesterol a day. This information is not intended to replace advice given to you by your health care provider. Make sure you discuss any questions you have with your  health care provider. Document Revised: 09/09/2017 Document Reviewed: 05/24/2017 Elsevier Patient Education  [place].

## 2020-06-03 NOTE — Chronic Care Management (AMB) (Signed)
Chronic Care Management Pharmacy  Name: [name] Brown  MRN: [id number] DOB: 12-22-1950   Chief Complaint/ HPI  [name],  70 y.o. , male presents for their Follow up CCM visit with the clinical pharmacist via telephone due to COVID-19 Pandemic.  PCP : Hoyt Koch, MD  Their chronic conditions include: HTN, Afib, CAD (CABG 2001), AV block, HLD, BPH, OSA, arthritis, migraines  Office Visits: 02/06/20 Dr Sharlet Salina OV: f/u for choking, sleep issues, sleep apnea. Rx zolpidem ER to replace trazodone. Rx Xyzal for choking (related to post nasal drip)  04/23/19 Dr Sharlet Salina VV: arthritis f/u, much improved after steroid course, cut down on Tylenol. Referred to sports medicine.  Consult Visit: 05/15/20 sleep study: severe OSA, will pursue Inspire placement. 03/19/20 admission for Afib ablation. 02/29/20 admission for Endoscopy.  02/04/20 Dr Curt Bears (cardiology): f/u for Afib. S/p cardioversion 11/2016. May benefit from ablation.   04/15/20 PA Clint Fenton (Afib clinic): s/p ablation 03/19/20. Continue Eliquis at least 3 months post ablation.  01/04/20 Dr Johnsie Cancel (cardiology): conditions stable, no med changes. Ordered ECHO and US carotid.   11/23/19 PA Fenton (cardiology EP): pt maintaining SR. No BB due to resting bradycardia. Emphasized need to control OSA, referred for [facility] device. 10/23/19 FNP Murray (OSA): Pt not sleeping well. Schedule for BiPAP titration, increased trazodone to 100 mg.  08/10/19 Admission for cyst evaluation and removal.  07/31/19 Dr Curt Bears (cardiology): EP eval. Considering ablation. 07/11/19 Dr Johnsie Cancel (cardiology): no med changes, ordered amio labs and carotid duplex.  05/15/19 Dr Tamala Julian (sports medicine): ordered lumbar MRI to evaluate back pain  Allergies  Allergen Reactions  . Other Other (See Comments)    Myalgias with elevated CPKs  . Statins     Myalgias with elevated CPKs    Medications: Outpatient Encounter Medications as of 06/03/2020    Medication Sig  . amiodarone (PACERONE) 200 MG tablet Take 200 mg by mouth daily.  Marland Kitchen apixaban (ELIQUIS) 5 MG TABS tablet Take 1 tablet (5 mg total) by mouth 2 (two) times daily.  Marland Kitchen aspirin EC 81 MG EC tablet Take 1 tablet (81 mg total) by mouth daily.  . Biotin 10000 MCG TABS Take 10,000 mcg by mouth daily.  . cholecalciferol (VITAMIN D) 25 MCG (1000 UT) tablet Take 1,000 Units by mouth daily.   . diclofenac sodium (VOLTAREN) 1 % GEL Apply 2 g topically 4 (four) times daily.  . divalproex (DEPAKOTE ER) 500 MG 24 hr tablet TAKE 1 TABLET(500 MG) BY MOUTH DAILY  . doxycycline (VIBRAMYCIN) 50 MG capsule Take 50 mg by mouth 2 (two) times daily.  Marland Kitchen ezetimibe (ZETIA) 10 MG tablet Take 1 tablet (10 mg total) by mouth daily.  . furosemide (LASIX) 20 MG tablet TAKE 1 TABLET(20 MG) BY MOUTH TWICE DAILY  . gabapentin (NEURONTIN) 300 MG capsule TAKE 1 CAPSULE(300 MG) BY MOUTH TWICE DAILY  . Glucosamine-Chondroitin (GLUCOSAMINE CHONDR COMPLEX PO) Take 500 mg by mouth 2 (two) times daily.   . hydrALAZINE (APRESOLINE) 50 MG tablet TAKE 1 TABLET(50 MG) BY MOUTH TWICE DAILY  . isosorbide mononitrate (IMDUR) 30 MG 24 hr tablet TAKE 1 TABLET BY MOUTH EVERY DAY  . levocetirizine (XYZAL) 5 MG tablet Take 1 tablet (5 mg total) by mouth every evening.  . montelukast (SINGULAIR) 10 MG tablet Take 1 tablet (10 mg total) by mouth at bedtime.  . Multiple Vitamin (MULTIVITAMIN) capsule Take 1 capsule by mouth daily.  . niacin (NIASPAN) 1000 MG CR tablet Take 1,000 mg by mouth  2 (two) times daily.    . nitroGLYCERIN (NITROSTAT) 0.4 MG SL tablet DISSOLVE 1 TABLET UNDER THE TONGUE EVERY 5 MINUTES FOR 3 DOSES AS NEEDED FOR CHEST PAIN  . Omega-3 Fatty Acids (FISH OIL) 1200 MG CAPS Take 1,200 mg by mouth 2 (two) times daily.  . potassium chloride SA (KLOR-CON) 20 MEQ tablet TAKE 1 TABLET BY MOUTH EVERY DAY  . ramipril (ALTACE) 10 MG capsule TAKE 1 CAPSULE BY MOUTH TWICE DAILY  . tamsulosin (FLOMAX) 0.4 MG CAPS capsule TAKE  ONE CAPSULE BY MOUTH DAILY  . tiZANidine (ZANAFLEX) 4 MG tablet Take 1 tablet (4 mg total) by mouth every 8 (eight) hours as needed for muscle spasms.  . vitamin C (ASCORBIC ACID) 500 MG tablet Take 500 mg by mouth daily.  Marland Kitchen zolpidem (AMBIEN CR) 12.5 MG CR tablet TAKE 1 TABLET(12.5 MG) BY MOUTH AT BEDTIME AS NEEDED FOR SLEEP   No facility-administered encounter medications on file as of 06/03/2020.     Current Diagnosis/Assessment:  SDOH Interventions     Most Recent Value  SDOH Interventions  Financial Strain Interventions Other (Comment)  [Eliquis PAP]     Goals Addressed            This Visit's Progress   . Pharmacy Care Plan       CARE PLAN ENTRY (see longitudinal plan of care for additional care plan information)  Current Barriers:  . Chronic Disease Management support, education, and care coordination needs related to Hypertension, Hyperlipidemia, Atrial Fibrillation, and Coronary Artery Disease   Hypertension BP Readings from Last 3 Encounters:  04/15/20 130/60  03/19/20 (!) 151/58  03/17/20 (!) 145/59 .  Pharmacist Clinical Goal(s): o Over the next 180 days, patient will work with PharmD and providers to maintain BP goal <130/80 . Current regimen:  o ramipril 10 mg twice a day o hydralazine 50 mg twice a day o isosorbide MN 30 mg daily . Interventions: o Discussed BP goals and benefits of medications for prevention of heart attack / stroke . Patient self care activities - Over the next 180 days, patient will: o Check BP 1-2 times weekly, document, and provide at future appointments o Ensure daily salt intake < 2300 mg/day  Hyperlipidemia / Coronary artery disease Lab Results  Component Value Date/Time   LDLCALC 84 11/29/2018 02:23 AM .  Pharmacist Clinical Goal(s): o Over the next 180 days, patient will work with PharmD and providers to achieve LDL goal < 70 . Current regimen:  o ezetimibe 10 mg daily,  o niacin 1000 mg twice a day o isosorbide MN 30 mg  daily,  o nitroglycerin 0.4 mg asneeded  o aspirin 81 mg daily o fish oil OTC daily . Interventions: o Discussed cholesterol goals and benefits of medications for prevention of heart attack / stroke . Patient self care activities - Over the next 180 days, patient will: o Continue current medications o Continue low cholesterol diet and exercise routine  Atrial Fibrillation Pulse Readings from Last 3 Encounters:  04/15/20 (!) 49  03/19/20 (!) 54  02/18/20 (!) 59 .  Pharmacist Clinical Goal(s) o Over the next 30 days, patient will work with PharmD and providers to optimize therapy . Current regimen:  o amiodarone 200 mg daily,  o Eliquis 5 mg twice a day . Interventions: o Pursue patient assistance for Eliquis . Patient self care activities - Over the next 30 days, patient will: o Provide proof of income and out of pocket costs o Complete patient portion  of Eliquis application  Medication management . Pharmacist Clinical Goal(s): o Over the next 180 days, patient will work with PharmD and providers to maintain optimal medication adherence . [facility] . Interventions o Comprehensive medication review performed. o Continue current medication management strategy . Patient self care activities - Over the next 180 days, patient will: o Focus on medication adherence by pill box o Take medications as prescribed o Report any questions or concerns to PharmD and/or provider(s)  Please see past updates related to this goal by clicking on the "Past Updates" button in the selected goal        AFIB   S/P ablation 03/19/20. At least 3 more months of Eliquis.  Patient is currently rhythm controlled. Pulse Readings from Last 3 Encounters:  04/15/20 (!) 49  03/19/20 (!) 54  02/18/20 (!) 59   Kidney Function Lab Results  Component Value Date/Time   CREATININE 1.26 (H) 04/15/2020 09:28 AM   CREATININE 1.74 (H) 03/19/2020 10:04 AM   GFR 52.23 (L)  03/16/2019 10:41 AM   GFRNONAA 58 (L) 04/15/2020 09:28 AM   GFRAA >60 04/15/2020 09:28 AM   K 4.8 04/15/2020 09:28 AM   K 5.4 (H) 03/19/2020 10:04 AM   CHA2DS2-VASc Score = 3  The patient's score is based upon: CHF History: 0 HTN History: 1 Age : 1 Diabetes History: 0 Stroke History: 0 Vascular Disease History: 1 Gender: 0  Patient has failed these meds in past: verapamil, metoprolol Patient is currently controlled on the following medications:   amiodarone 200 mg daily  Eliquis 5 mg BID  We discussed: Pt had ablation in June 2021, may d/c amiodarone next month with cardiologist. May consider d/c Eliquis after 6 months. Pt reports he just entered coverage gap and Eliquis is over $300. Discussed requirements for Eliquis PAP - 300% FPL and 3% OOP spend.   Plan  Continue current medications Pursue PAP for Eliquis  Hypertension   BP goal < 130/80  Office blood pressures are  BP Readings from Last 3 Encounters:  04/15/20 130/60  03/19/20 (!) 151/58  03/17/20 (!) 145/59   Patient checks BP at home 1-2x per week  Patient home BP readings are ranging: 120s/70s  Patient has failed these meds in the past: verapamil, metoprolol, methyldopa, HCTZ Patient is currently controlled on the following meds:   ramipril 10 mg BID,   hydralazine 50 mg BID,   isosorbide MN 30 mg daily,   furosemide 20 mg BID,   Klor-Con 20 meq daily  We discussed diet and exercise extensively, BP goals, how isosorbide and hydralazine work together.   Plan  Continue current medications and control with diet and exercise     Hyperlipidemia/CAD   LDL goal < 70 Hx CAD (CABG 2001) Statin intolerance - myalgias, elevated CPK  Lipid Panel     Component Value Date/Time   CHOL 114 11/29/2018 0223   TRIG 32 11/29/2018 0223   HDL 24 (L) 11/29/2018 0223   CHOLHDL 4.8 11/29/2018 0223   VLDL 6 11/29/2018 0223   LDLCALC 84 11/29/2018 0223    Hepatic Function Latest Ref Rng & Units 04/15/2020  03/16/2019 11/28/2018  Total Protein 6.5 - 8.1 g/dL 6.4(L) 6.8 6.5  Albumin 3.5 - 5.0 g/dL 3.8 4.3 3.8  AST 15 - 41 U/L _0 ALT 0 - 44 U/L _1 Alk Phosphatase 38 - 126 U/L 42 43 46  Total Bilirubin 0.3 - 1.2 mg/dL 0.8  0.6 0.9  Bilirubin, Direct 0.0 - 0.3 mg/dL - - -   Patient has failed these meds in past: statins  Patient is currently uncontrolled on the following medications:   ezetimibe 10 mg daily,   niacin 1000 mg BID,   isosorbide MN 30 mg daily,   nitroglycerin 0.4 mg SL prn,   aspirin 81 mg,   fish oil OTC  We discussed:  History of myalgias with statins; cholesterol goals; benefits of LDL < 70 for ASCVD risk reduction  Plan  Continue current medications and control with diet and exercise    Insomnia   Patient has failed these meds in past: trazodone Patient is currently controlled on the following medications:   Zolpidem CR 12.5 mg HS  We discussed: Patient is satisfied with current regimen and denies issue  Plan  Continue current medications   Medication Management   Patient uses [facility] mail order pharmacy Wife sets up pill box each Monday Pt endorses 100% compliance  We discussed:  Benefits of medication synchronization, packaging and delivery with [employer]. Pt is happy with his current system of managing meds at home.   Plan  Continue current medication management strategy   Follow up: 1 month phone visit  Charlene Brooke, PharmD, [facility] Clinical Pharmacist [facility] at [facility] [phone]

## 2020-06-08 ENCOUNTER — Other Ambulatory Visit: Payer: Self-pay | Admitting: Cardiovascular Disease

## 2020-06-16 ENCOUNTER — Other Ambulatory Visit: Payer: Self-pay | Admitting: Internal Medicine

## 2020-06-19 ENCOUNTER — Ambulatory Visit: Payer: PPO | Admitting: Cardiology

## 2020-06-19 ENCOUNTER — Encounter: Payer: Self-pay | Admitting: Cardiology

## 2020-06-19 ENCOUNTER — Other Ambulatory Visit: Payer: Self-pay

## 2020-06-19 VITALS — BP 120/80 | HR 48 | Ht 70.0 in | Wt 210.4 lb

## 2020-06-19 DIAGNOSIS — I4819 Other persistent atrial fibrillation: Secondary | ICD-10-CM | POA: Diagnosis not present

## 2020-06-19 MED ORDER — HYDRALAZINE HCL 25 MG PO TABS
25.0000 mg | ORAL_TABLET | Freq: Two times a day (BID) | ORAL | 2 refills | Status: DC
Start: 2020-06-19 — End: 2021-08-18

## 2020-06-19 NOTE — Patient Instructions (Signed)
Medication Instructions:  Your physician has recommended you make the following change in your medication:  1. STOP Amiodarone 2. DECREASE Hydralazine to 25 mg twice a day  *If you need a refill on your cardiac medications before your next appointment, please call your pharmacy*   Lab Work: None ordered   Testing/Procedures: None ordered   Follow-Up: At [facility], you and your health needs are our priority.  As part of our continuing mission to provide you with exceptional heart care, we have created designated Provider Care Teams.  These Care Teams include your primary Cardiologist (physician) and Advanced Practice Providers (APPs -  Physician Assistants and Nurse Practitioners) who all work together to provide you with the care you need, when you need it.  We recommend signing up for the patient portal called "MyChart".  Sign up information is provided on this After Visit Summary.  MyChart is used to connect with patients for Virtual Visits (Telemedicine).  Patients are able to view lab/test results, encounter notes, upcoming appointments, etc.  Non-urgent messages can be sent to your provider as well.   To learn more about what you can do with MyChart, go to [email or web address].    Your next appointment:   3 month(s)  The format for your next appointment:   In Person  Provider:   Allegra Lai, MD   Thank you for choosing [facility]!!   Trinidad Curet, RN [phone]    Other Instructions

## 2020-06-19 NOTE — Addendum Note (Signed)
Addended by: Allegra Lai M on: 06/19/2020 09:55 AM   Modules accepted: Level of Service

## 2020-06-19 NOTE — Progress Notes (Addendum)
Electrophysiology Office Note   Date:  06/19/2020   ID:  [name] Brown, DOB 02/12/1951, MRN [id number]  PCP:  Hoyt Koch, MD  Cardiologist:  Johnsie Cancel Primary Electrophysiologist:  Bates Collington Meredith Leeds, MD    No chief complaint on file.    History of Present Illness: [name] Brown is a 69 y.o. male who is being seen today for the evaluation of atrial fibrillation at the request of Hoyt Koch, *. Presenting today for electrophysiology evaluation.  He has a history of coronary artery disease status post three-vessel CABG, hypertension, hyperlipidemia, and atrial fibrillation.  He was found to be in atrial fibrillation incidentally on office visit 08/30/2017.  He was put on Eliquis at the time.  Beta-blockers were not started due to resting bradycardia.  January 2020 he was noted increasing dyspnea on exertion.  He had a cardioversion 11/28/2016.  He is now status post AF ablation 03/19/2020.  Today, denies symptoms of palpitations, chest pain, shortness of breath, orthopnea, PND, lower extremity edema, claudication, dizziness, presyncope, syncope, bleeding, or neurologic sequela. The patient is tolerating medications without difficulties.  He is noted no further episodes of atrial fibrillation.  Unfortunately, he states that his blood pressures in the mornings have dropped and have been quite low.  During this time, he feels weak and fatigued.  His blood pressures have been at times in the 80s to 90s.  He has been having symptoms of weakness and fatigue.  He does not feel this is due to atrial fibrillation but attributes it to his untreated sleep apnea.  He has plans for further therapy.  Past Medical History:  Diagnosis Date  . Aortic aneurysm ([place]) Endograf 2009  . Arthritis   . Atrial fibrillation ([place])   . Bradycardia   . CAD (coronary artery disease) CABG 03/2000   Median sternotomy for coronary artery bypass grafting x 3 (left  . HTN (hypertension)   . Hx of CABG  2001   severe 3 vessel disease  . Hx of colonic polyp 06/2010   Hyperplastic   . Hyperlipidemia   . Myocardial infarction ([place])   . Sleep apnea    Past Surgical History:  Procedure Laterality Date  . ATRIAL FIBRILLATION ABLATION N/A 03/19/2020   Procedure: ATRIAL FIBRILLATION ABLATION;  Surgeon: Constance Haw, MD;  Location: [facility];  Service: Cardiovascular;  Laterality: N/A;  . CARDIOVERSION N/A 11/28/2018   Procedure: CARDIOVERSION;  Surgeon: Elouise Munroe, MD;  Location: [facility] ENDOSCOPY;  Service: Cardiovascular;  Laterality: N/A;  . COLONOSCOPY    . CORONARY ARTERY BYPASS GRAFT     AAA stent graft on 12/01/2007 at [facility]  coronary bypass in 2001. Dr. Roxy Manns.  Marland Kitchen DRUG INDUCED ENDOSCOPY Right 02/18/2020   Procedure: DRUG INDUCED ENDOSCOPY;  Surgeon: Melida Quitter, MD;  Location: [facility];  Service: ENT;  Laterality: Right;  . FOOT SURGERY Left 07/19/2017  . LEFT HEART CATHETERIZATION WITH CORONARY ANGIOGRAM N/A 10/26/2013   Procedure: LEFT HEART CATHETERIZATION WITH CORONARY ANGIOGRAM;  Surgeon: Peter M Martinique, MD;  Location: [facility] CATH LAB;  Service: Cardiovascular;  Laterality: N/A;  . MASS EXCISION Right 08/10/2019   Procedure: EXCISION OF CYST FROM RIGHT SCALP;  Surgeon: Jovita Kussmaul, MD;  Location: [facility];  Service: General;  Laterality: Right;     Current Outpatient Medications  Medication Sig Dispense Refill  . apixaban (ELIQUIS) 5 MG TABS tablet Take 1 tablet (5 mg total) by mouth  2 (two) times daily. 180 tablet 2  . aspirin EC 81 MG EC tablet Take 1 tablet (81 mg total) by mouth daily.    . Biotin 10000 MCG TABS Take 10,000 mcg by mouth daily.    . cholecalciferol (VITAMIN D) 25 MCG (1000 UT) tablet Take 1,000 Units by mouth daily.     . diclofenac sodium (VOLTAREN) 1 % GEL Apply 2 g topically 4 (four) times daily. 100 g 3  . divalproex (DEPAKOTE ER) 500 MG 24 hr tablet TAKE 1 TABLET(500 MG) BY MOUTH  DAILY 90 tablet 1  . doxycycline (VIBRAMYCIN) 50 MG capsule Take 50 mg by mouth 2 (two) times daily.    Marland Kitchen ezetimibe (ZETIA) 10 MG tablet Take 1 tablet (10 mg total) by mouth daily. 90 tablet 3  . furosemide (LASIX) 20 MG tablet TAKE 1 TABLET(20 MG) BY MOUTH TWICE DAILY 180 tablet 1  . gabapentin (NEURONTIN) 300 MG capsule TAKE 1 CAPSULE(300 MG) BY MOUTH TWICE DAILY 60 capsule 2  . Glucosamine-Chondroitin (GLUCOSAMINE CHONDR COMPLEX PO) Take 500 mg by mouth 2 (two) times daily.     . isosorbide mononitrate (IMDUR) 30 MG 24 hr tablet TAKE 1 TABLET BY MOUTH EVERY DAY 90 tablet 2  . levocetirizine (XYZAL) 5 MG tablet Take 1 tablet (5 mg total) by mouth every evening. 90 tablet 3  . montelukast (SINGULAIR) 10 MG tablet Take 1 tablet (10 mg total) by mouth at bedtime. 30 tablet 3  . Multiple Vitamin (MULTIVITAMIN) capsule Take 1 capsule by mouth daily.    . niacin (NIASPAN) 1000 MG CR tablet Take 1,000 mg by mouth 2 (two) times daily.      . nitroGLYCERIN (NITROSTAT) 0.4 MG SL tablet DISSOLVE 1 TABLET UNDER THE TONGUE EVERY 5 MINUTES FOR 3 DOSES AS NEEDED FOR CHEST PAIN 25 tablet 4  . Omega-3 Fatty Acids (FISH OIL) 1200 MG CAPS Take 1,200 mg by mouth 2 (two) times daily.    . potassium chloride SA (KLOR-CON) 20 MEQ tablet TAKE 1 TABLET BY MOUTH EVERY DAY 90 tablet 3  . ramipril (ALTACE) 10 MG capsule TAKE 1 CAPSULE BY MOUTH TWICE DAILY 180 capsule 3  . tamsulosin (FLOMAX) 0.4 MG CAPS capsule TAKE ONE CAPSULE BY MOUTH DAILY 90 capsule 3  . tiZANidine (ZANAFLEX) 4 MG tablet Take 1 tablet (4 mg total) by mouth every 8 (eight) hours as needed for muscle spasms. 270 tablet 3  . vitamin C (ASCORBIC ACID) 500 MG tablet Take 500 mg by mouth daily.    Marland Kitchen zolpidem (AMBIEN CR) 12.5 MG CR tablet TAKE 1 TABLET(12.5 MG) BY MOUTH AT BEDTIME AS NEEDED FOR SLEEP 30 tablet 5  . hydrALAZINE (APRESOLINE) 25 MG tablet Take 1 tablet (25 mg total) by mouth in the morning and at bedtime. 270 tablet 2   No current  facility-administered medications for this visit.    Allergies:   Other and Statins   Social History:  The patient  reports that he quit smoking about 28 years ago. His smoking use included cigarettes. He has a 60.00 pack-year smoking history. He has never used smokeless tobacco. He reports that he does not drink alcohol and does not use drugs.   Family History:  The patient's family history includes Heart attack in his father and mother; Heart disease in his father and mother.   ROS:  Please see the history of present illness.   Otherwise, review of systems is positive for none.   All other systems are reviewed and  negative.   PHYSICAL EXAM: VS:  BP 120/80   Pulse (!) 48   Ht 5\' 10"  (1.778 m)   Wt 210 lb 6.4 oz (95.4 kg)   SpO2 93%   BMI 30.19 kg/m  , BMI Body mass index is 30.19 kg/m. GEN: Well nourished, well developed, in no acute distress  HEENT: normal  Neck: no JVD, carotid bruits, or masses Cardiac: RRR; no murmurs, rubs, or gallops,no edema  Respiratory:  clear to auscultation bilaterally, normal work of breathing GI: soft, nontender, nondistended, + BS MS: no deformity or atrophy  Skin: warm and dry Neuro:  Strength and sensation are intact Psych: euthymic mood, full affect  EKG:  EKG is ordered today. Personal review of the ekg ordered shows sinus rhythm, rate 48  Recent Labs: 03/19/2020: Hemoglobin 10.8; Platelets 183 04/15/2020: ALT 18; BUN 26; Creatinine, Ser 1.26; Potassium 4.8; Sodium 138; TSH 1.233    Lipid Panel     Component Value Date/Time   CHOL 114 11/29/2018 0223   TRIG 32 11/29/2018 0223   HDL 24 (L) 11/29/2018 0223   CHOLHDL 4.8 11/29/2018 0223   VLDL 6 11/29/2018 0223   LDLCALC 84 11/29/2018 0223     Wt Readings from Last 3 Encounters:  06/19/20 210 lb 6.4 oz (95.4 kg)  05/15/20 212 lb (96.2 kg)  04/15/20 211 lb 3.2 oz (95.8 kg)      Other studies Reviewed: Additional studies/ records that were reviewed today include: TTE  01/21/20 Review of the above records today demonstrates:  1. Left ventricular ejection fraction, by estimation, is 55 to 60%. The  left ventricle has normal function. Left ventricular endocardial border  not optimally defined to evaluate regional wall motion. There is mild left  ventricular hypertrophy. Left  ventricular diastolic parameters are consistent with Grade II diastolic  dysfunction (pseudonormalization).  2. Right ventricular systolic function is normal. The right ventricular  size is mildly enlarged. There is mildly elevated pulmonary artery  systolic pressure.  3. Left atrial size was moderately dilated.  4. Right atrial size was mildly dilated.  5. The mitral valve is normal in structure. Mild mitral valve  regurgitation.  6. The aortic valve is tricuspid. Aortic valve regurgitation is mild.  Mild aortic valve sclerosis is present, with no evidence of aortic valve  stenosis.  7. The inferior vena cava is normal in size with greater than 50%  respiratory variability, suggesting right atrial pressure of 3 mmHg.   SPECT 11/08/18  The left ventricular ejection fraction is normal (55-65%).  Nuclear stress EF: 56%. Post CABG septal wall hypokinesis.  There was no ST segment deviation noted during stress.  There is mildly reduced radiotracer uptake at both rest and stress in the anterior, lateral and inferolateral distributions. No ischemia identified. (POST CABG)  This is a low risk study.  ASSESSMENT AND PLAN:  1.  Persistent atrial fibrillation: Currently on Eliquis and amiodarone (high risk medication monitoring) with a CHA2DS2-VASc of 3.  He is status post AF ablation 03/19/2020.  We Jadian Karman stop his amiodarone today.  He remains in sinus rhythm.  2.  Coronary artery disease: Status post CABG with occluded radial to the OM.  No current chest pain.  3.  Hypertension: Blood pressure is a bit low, in the systolic 10X in the mornings.  He feels weak and fatigued.  We  Andrei Mccook increase hydralazine to 25 mg.  4.  Hyperlipidemia: Continue statin per primary cardiology  5.  Obstructive sleep apnea: Not tolerating CPAP  mask.  Has been working to see if he can find another method of therapy.  He does continue to have weakness and fatigue and is a more permanent fix.  Current medicines are reviewed at length with the patient today.   The patient does not have concerns regarding his medicines.  The following changes were made today: Stop amiodarone, decrease hydralazine  Labs/ tests ordered today include:  Orders Placed This Encounter  Procedures  . EKG 12-Lead    Disposition:   FU with Drew Herman 3 months  Signed, Dove Gresham Meredith Leeds, MD  06/19/2020 9:24 AM     [facility] [address] [place] [address] [phone] (office) [phone] (fax)

## 2020-06-24 ENCOUNTER — Other Ambulatory Visit: Payer: Self-pay | Admitting: Cardiovascular Disease

## 2020-07-17 ENCOUNTER — Other Ambulatory Visit: Payer: Self-pay | Admitting: Otolaryngology

## 2020-07-17 ENCOUNTER — Telehealth: Payer: Self-pay | Admitting: Cardiovascular Disease

## 2020-07-17 ENCOUNTER — Encounter: Payer: Self-pay | Admitting: Gastroenterology

## 2020-07-17 NOTE — Telephone Encounter (Signed)
   Primary Cardiologist: Jenkins Rouge, MD  Chart reviewed as part of pre-operative protocol coverage. Given past medical history and time since last visit, based on ACC/AHA guidelines, ASCHER SCHROEPFER would be at acceptable risk for the planned procedure without further cardiovascular testing.   Patient with diagnosis of afib on Eliquis for anticoagulation.    Procedure: Inspire Date of procedure: TBD  CHADS2-VASc score of  3 (HTN, AGE, CAD)  CrCl 65 ml/min  Per office protocol, patient can hold Eliquis for 2 days prior to procedure.   I will route this recommendation to the requesting party via Epic fax function and remove from pre-op pool.  Please call with questions.  Jossie Ng. Crespin Forstrom NP-C    07/17/2020, 2:26 PM [place] [address] Office [phone] Fax [phone]

## 2020-07-17 NOTE — Telephone Encounter (Signed)
error 

## 2020-07-17 NOTE — Telephone Encounter (Signed)
Patient with diagnosis of afib on Eliquis for anticoagulation.    Procedure: Inspire  Date of procedure: TBD  CHADS2-VASc score of  3 (HTN, AGE, CAD)  CrCl 65 ml/min  Per office protocol, patient can hold Eliquis for 2 days prior to procedure.

## 2020-07-17 NOTE — Telephone Encounter (Signed)
   [facility] Pre-operative Risk Assessment    HEARTCARE STAFF: - Please ensure there is not already an duplicate clearance open for this procedure. - Under Visit Info/Reason for Call, type in Other and utilize the format Clearance MM/DD/YY or Clearance TBD. Do not use dashes or single digits. - If request is for dental extraction, please clarify the # of teeth to be extracted.  Request for surgical clearance:  1. What type of surgery is being performed? Inspire   2. When is this surgery scheduled? pending   3. What type of clearance is required (medical clearance vs. Pharmacy clearance to hold med vs. Both)? Both  4. Are there any medications that need to be held prior to surgery and how long? All of his Cardiac Medicine   5. Practice name and name of physician performing surgery?  Dr Melida Quitter   6. What is the office phone number? [phone] 7.   What is the office fax number?  [phone]  8.   Anesthesia type (None, local, MAC, general) ?  General   Glyn Ade 07/17/2020, 12:50 PM  _________________________________________________________________   (provider comments below)

## 2020-07-22 NOTE — Progress Notes (Signed)
Patient ID: [name] Brown, male   DOB: 1951-05-26, 69 y.o.   MRN: [id number]    69 y.o. 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  Last . Cath by Dr Martinique 2015  showed stable disease for medical Rx The free RIMA to circumflex was occluded with patent LIMA to LAD and RIMA to RCA Mid circumflex with 30-40% disease and OM3 with 50-60% PVD followed by Dr Doren Custard post EVAR    01/15/20  Duplex  Left ICA 40-59% stenosis.   05/10/18   EVAR [place]  3.2  cm sack size stable no endoleak  Found to be in asymptomatic afib during office visit 08/2017 On eliquis for CHADVASC 3 Not On beta blocker due to bradycardia and SSS Holter 11/08/18 with average HR 54 3.6 second pause  January 2020  noted increasing HR with activity more dyspnea with exertion. This occurs when traveling Walking up drive way He has noted tightness in chest that may be wheezing. Functional status has Decreased quite a bit Myovue non ischemic with normal EF and echo same. BNP only 288 CXR Elevated left hemidiaphragm and scarring   Started on amiodarone by Dr Curt Bears for afib 12/06/18 eventually had afib ablation with Dr Curt Bears June 2021  BP running low on f/u visit and Hydralazine dose decreased   Had COVID back in December not that sick and since has had vaccine  Has OSA intolerant to CPAP with Inspire stimulator placed by Dr Redmond Baseman ENT earlier this week Some bruising but back on anticoagulant. Dr Radford Pax to see next week to activate BP still drops Low at times when he works outside    ROS: Denies fever, malais, weight loss, blurry vision, decreased visual acuity, cough, sputum, SOB, hemoptysis, pleuritic pain, palpitaitons, heartburn, abdominal pain, melena, lower extremity edema, claudication, or rash.  All other systems reviewed and negative  General: BP 138/62   Pulse (!) 56   Ht 5\' 10"  (1.778 m)   Wt 215 lb (97.5 kg)   SpO2 90%   BMI 30.85 kg/m  Affect appropriate Chronically ill male  HEENT: some bruising  over right chest with stimulator and neck  Neck supple with no adenopathy JVP normal left  bruits no thyromegaly Lungs mild upper lobe  wheezing and good diaphragmatic motion Heart:  S1/S2 SEM murmur, no rub, gallop or click PMI normal Abdomen: benighn, BS positve, no tenderness, no AAA no bruit.  No HSM or HJR Distal pulses intact with no bruits No edema Neuro non-focal Skin warm and dry No muscular weakness   Current Outpatient Medications  Medication Sig Dispense Refill  . apixaban (ELIQUIS) 5 MG TABS tablet Take 1 tablet (5 mg total) by mouth 2 (two) times daily. 180 tablet 2  . aspirin EC 81 MG EC tablet Take 1 tablet (81 mg total) by mouth daily.    . Biotin 10000 MCG TABS Take 10,000 mcg by mouth daily.    . cholecalciferol (VITAMIN D) 25 MCG (1000 UT) tablet Take 1,000 Units by mouth daily.     . diclofenac sodium (VOLTAREN) 1 % GEL Apply 2 g topically 4 (four) times daily. 100 g 3  . divalproex (DEPAKOTE ER) 500 MG 24 hr tablet TAKE 1 TABLET(500 MG) BY MOUTH DAILY 90 tablet 1  . doxycycline (VIBRAMYCIN) 50 MG capsule Take 50 mg by mouth 2 (two) times daily.    Marland Kitchen ezetimibe (ZETIA) 10 MG tablet Take 1 tablet (10 mg total) by mouth daily.  90 tablet 3  . furosemide (LASIX) 20 MG tablet TAKE 1 TABLET(20 MG) BY MOUTH TWICE DAILY 180 tablet 1  . gabapentin (NEURONTIN) 300 MG capsule TAKE 1 CAPSULE(300 MG) BY MOUTH TWICE DAILY 60 capsule 2  . Glucosamine-Chondroitin (GLUCOSAMINE CHONDR COMPLEX PO) Take 500 mg by mouth 2 (two) times daily.     . hydrALAZINE (APRESOLINE) 25 MG tablet Take 1 tablet (25 mg total) by mouth in the morning and at bedtime. 270 tablet 2  . HYDROcodone-acetaminophen (NORCO/VICODIN) 5-325 MG tablet Take 1-2 tablets by mouth every 6 (six) hours as needed for moderate pain. 12 tablet 0  . isosorbide mononitrate (IMDUR) 30 MG 24 hr tablet TAKE 1 TABLET BY MOUTH EVERY DAY 90 tablet 2  . levocetirizine (XYZAL) 5 MG tablet Take 1 tablet (5 mg total) by mouth every  evening. 90 tablet 3  . Multiple Vitamin (MULTIVITAMIN) capsule Take 1 capsule by mouth daily.    . niacin (NIASPAN) 1000 MG CR tablet Take 1,000 mg by mouth 2 (two) times daily.      . nitroGLYCERIN (NITROSTAT) 0.4 MG SL tablet DISSOLVE 1 TABLET UNDER THE TONGUE EVERY 5 MINUTES FOR 3 DOSES AS NEEDED FOR CHEST PAIN 25 tablet 4  . Omega-3 Fatty Acids (FISH OIL) 1200 MG CAPS Take 1,200 mg by mouth 2 (two) times daily.    . potassium chloride SA (KLOR-CON) 20 MEQ tablet TAKE 1 TABLET BY MOUTH EVERY DAY 90 tablet 3  . ramipril (ALTACE) 10 MG capsule TAKE 1 CAPSULE BY MOUTH TWICE DAILY 180 capsule 3  . tamsulosin (FLOMAX) 0.4 MG CAPS capsule TAKE ONE CAPSULE BY MOUTH DAILY 90 capsule 3  . tiZANidine (ZANAFLEX) 4 MG tablet Take 1 tablet (4 mg total) by mouth every 8 (eight) hours as needed for muscle spasms. 270 tablet 3  . vitamin C (ASCORBIC ACID) 500 MG tablet Take 500 mg by mouth daily.    Marland Kitchen zolpidem (AMBIEN CR) 12.5 MG CR tablet TAKE 1 TABLET(12.5 MG) BY MOUTH AT BEDTIME AS NEEDED FOR SLEEP 30 tablet 5   No current facility-administered medications for this visit.    Allergies  Other and Statins  Electrocardiogram:  07/17/14  SR possible old IMI no change from 2014   10/15/14 SR rate 59 PR 224  LAFB   Assessment and Plan  CAD: Occluded free radial to OM EF normal by TTE 02/27/18  Normal myovue 11/08/18   AAA:  Post EVAR f/u Scot Dock residual lumen 3.2 cm on duplex 05/10/18  Bruit: duplex 10/15/38 stable 08-67% LICA stenosis f/u duplex April 2022   HTN: BP been low d/c ramipril observe   Chol:   Cholesterol is at goal.  Continue current dose of statin and diet Rx.  No myalgias or side effects.  F/U  LFT's in 6 months. Lab Results  Component Value Date   [place] 84 11/29/2018  Labs with primary   Urology:  Urinary frequency at night better on flomax f/u primary   Afib: Diagnosed November 2018  Asymptomatic  CHA2Vasc 3  eliquis 5 bid known History of SSS  Not on beta blockers. Had [facility]  11/28/16 then Rx amiodarone and finally AF ablation 03/19/20  Maintaining NSR f/u [place]   Dyspnea:  No ischemia on myovue and normal EF by TTE 11/08/18 as well CXR showed chronic Elevation in left hemidiaphragm with atelectasis and scarring Dyspnea would not appear to be Related to cardiac abnormality BNP 03/16/19 only 287 DLCO 08/21/19 only mild decrease in DLCO Will f/u with  primary regarding OSA and his CPAP   Anemia:  Hct 32.8 03/19/20 normal ferritin f/u primary   OSA:  Severe intolerant to bipap, CPAP not effective enough ? Dr Redmond Baseman to implant Inspire device Ok to hold DOAC 48 hours before procedure         D/C ramapril F/U Turner Activate Stimulator Carotid f/u US April 2022     Jenkins Rouge

## 2020-07-23 ENCOUNTER — Other Ambulatory Visit: Payer: Self-pay

## 2020-07-23 ENCOUNTER — Encounter (HOSPITAL_BASED_OUTPATIENT_CLINIC_OR_DEPARTMENT_OTHER): Payer: Self-pay | Admitting: Otolaryngology

## 2020-07-25 ENCOUNTER — Encounter (HOSPITAL_BASED_OUTPATIENT_CLINIC_OR_DEPARTMENT_OTHER)
Admission: RE | Admit: 2020-07-25 | Discharge: 2020-07-25 | Disposition: A | Discharge status: Home / Self Care | Payer: PPO | Source: Ambulatory Visit | Attending: Otolaryngology | Admitting: Otolaryngology

## 2020-07-25 DIAGNOSIS — Z01812 Encounter for preprocedural laboratory examination: Secondary | ICD-10-CM | POA: Insufficient documentation

## 2020-07-25 LAB — BASIC METABOLIC PANEL
Anion gap: 9 (ref 5–15)
BUN: 25 mg/dL — ABNORMAL HIGH (ref 8–23)
CO2: 27 mmol/L (ref 22–32)
Calcium: 9.4 mg/dL (ref 8.9–10.3)
Chloride: 103 mmol/L (ref 98–111)
Creatinine, Ser: 1.3 mg/dL — ABNORMAL HIGH (ref 0.61–1.24)
GFR, Estimated: 56 mL/min — ABNORMAL LOW (ref >60)
Glucose, Bld: 95 mg/dL (ref 70–99)
Potassium: 5.4 mmol/L — ABNORMAL HIGH (ref 3.5–5.1)
Sodium: 139 mmol/L (ref 135–145)

## 2020-07-26 ENCOUNTER — Other Ambulatory Visit (HOSPITAL_COMMUNITY)
Admission: RE | Admit: 2020-07-26 | Discharge: 2020-07-26 | Disposition: A | Discharge status: Home / Self Care | Payer: PPO | Source: Ambulatory Visit | Attending: Otolaryngology | Admitting: Otolaryngology

## 2020-07-26 ENCOUNTER — Other Ambulatory Visit: Payer: PPO

## 2020-07-26 DIAGNOSIS — Z20822 Contact with and (suspected) exposure to covid-19: Secondary | ICD-10-CM | POA: Diagnosis not present

## 2020-07-26 DIAGNOSIS — Z01812 Encounter for preprocedural laboratory examination: Secondary | ICD-10-CM | POA: Diagnosis not present

## 2020-07-27 LAB — SARS CORONAVIRUS 2 (TAT 6-24 HRS): SARS Coronavirus 2: NEGATIVE

## 2020-07-28 NOTE — Progress Notes (Signed)
K+ 5.4, Dr. Sabra Heck aware, will repeat day of surgery.

## 2020-07-30 ENCOUNTER — Ambulatory Visit (HOSPITAL_BASED_OUTPATIENT_CLINIC_OR_DEPARTMENT_OTHER)
Admission: RE | Admit: 2020-07-30 | Discharge: 2020-07-30 | Disposition: A | Discharge status: Home / Self Care | Payer: PPO | Attending: Otolaryngology | Admitting: Otolaryngology

## 2020-07-30 ENCOUNTER — Ambulatory Visit (HOSPITAL_BASED_OUTPATIENT_CLINIC_OR_DEPARTMENT_OTHER): Payer: PPO | Admitting: Certified Registered"

## 2020-07-30 ENCOUNTER — Ambulatory Visit (HOSPITAL_COMMUNITY): Payer: PPO

## 2020-07-30 ENCOUNTER — Encounter (HOSPITAL_BASED_OUTPATIENT_CLINIC_OR_DEPARTMENT_OTHER)
Admission: RE | Disposition: A | Discharge status: Home / Self Care | Payer: Self-pay | Source: Home / Self Care | Attending: Otolaryngology

## 2020-07-30 ENCOUNTER — Encounter (HOSPITAL_BASED_OUTPATIENT_CLINIC_OR_DEPARTMENT_OTHER): Payer: Self-pay | Admitting: Otolaryngology

## 2020-07-30 ENCOUNTER — Other Ambulatory Visit: Payer: Self-pay

## 2020-07-30 DIAGNOSIS — I4819 Other persistent atrial fibrillation: Secondary | ICD-10-CM | POA: Diagnosis not present

## 2020-07-30 DIAGNOSIS — I10 Essential (primary) hypertension: Secondary | ICD-10-CM | POA: Diagnosis not present

## 2020-07-30 DIAGNOSIS — I119 Hypertensive heart disease without heart failure: Secondary | ICD-10-CM | POA: Diagnosis not present

## 2020-07-30 DIAGNOSIS — Z7901 Long term (current) use of anticoagulants: Secondary | ICD-10-CM | POA: Insufficient documentation

## 2020-07-30 DIAGNOSIS — Z951 Presence of aortocoronary bypass graft: Secondary | ICD-10-CM | POA: Insufficient documentation

## 2020-07-30 DIAGNOSIS — I6523 Occlusion and stenosis of bilateral carotid arteries: Secondary | ICD-10-CM | POA: Diagnosis not present

## 2020-07-30 DIAGNOSIS — E785 Hyperlipidemia, unspecified: Secondary | ICD-10-CM | POA: Diagnosis not present

## 2020-07-30 DIAGNOSIS — I4891 Unspecified atrial fibrillation: Secondary | ICD-10-CM | POA: Insufficient documentation

## 2020-07-30 DIAGNOSIS — Z87891 Personal history of nicotine dependence: Secondary | ICD-10-CM | POA: Diagnosis not present

## 2020-07-30 DIAGNOSIS — M1990 Unspecified osteoarthritis, unspecified site: Secondary | ICD-10-CM | POA: Diagnosis not present

## 2020-07-30 DIAGNOSIS — Z8249 Family history of ischemic heart disease and other diseases of the circulatory system: Secondary | ICD-10-CM | POA: Insufficient documentation

## 2020-07-30 DIAGNOSIS — Z888 Allergy status to other drugs, medicaments and biological substances status: Secondary | ICD-10-CM | POA: Diagnosis not present

## 2020-07-30 DIAGNOSIS — G4733 Obstructive sleep apnea (adult) (pediatric): Secondary | ICD-10-CM | POA: Insufficient documentation

## 2020-07-30 DIAGNOSIS — I252 Old myocardial infarction: Secondary | ICD-10-CM | POA: Insufficient documentation

## 2020-07-30 DIAGNOSIS — I2510 Atherosclerotic heart disease of native coronary artery without angina pectoris: Secondary | ICD-10-CM | POA: Diagnosis not present

## 2020-07-30 DIAGNOSIS — Z6830 Body mass index (BMI) 30.0-30.9, adult: Secondary | ICD-10-CM | POA: Diagnosis not present

## 2020-07-30 DIAGNOSIS — Z79899 Other long term (current) drug therapy: Secondary | ICD-10-CM | POA: Diagnosis not present

## 2020-07-30 DIAGNOSIS — Z969 Presence of functional implant, unspecified: Secondary | ICD-10-CM | POA: Diagnosis not present

## 2020-07-30 DIAGNOSIS — I517 Cardiomegaly: Secondary | ICD-10-CM | POA: Diagnosis not present

## 2020-07-30 DIAGNOSIS — Z7982 Long term (current) use of aspirin: Secondary | ICD-10-CM | POA: Diagnosis not present

## 2020-07-30 DIAGNOSIS — I700 Atherosclerosis of aorta: Secondary | ICD-10-CM | POA: Diagnosis not present

## 2020-07-30 HISTORY — PX: IMPLANTATION OF HYPOGLOSSAL NERVE STIMULATOR: SHX6827

## 2020-07-30 LAB — BASIC METABOLIC PANEL
Anion gap: 11 (ref 5–15)
BUN: 26 mg/dL — ABNORMAL HIGH (ref 8–23)
CO2: 25 mmol/L (ref 22–32)
Calcium: 9.5 mg/dL (ref 8.9–10.3)
Chloride: 104 mmol/L (ref 98–111)
Creatinine, Ser: 1.43 mg/dL — ABNORMAL HIGH (ref 0.61–1.24)
GFR, Estimated: 50 mL/min — ABNORMAL LOW (ref >60)
Glucose, Bld: 99 mg/dL (ref 70–99)
Potassium: 4.3 mmol/L (ref 3.5–5.1)
Sodium: 140 mmol/L (ref 135–145)

## 2020-07-30 SURGERY — INSERTION, HYPOGLOSSAL NERVE STIMULATOR
Anesthesia: General | Site: Neck | Laterality: Right

## 2020-07-30 MED ORDER — MEPERIDINE HCL 25 MG/ML IJ SOLN: 6.2500 mg | INTRAMUSCULAR | Status: DC | PRN

## 2020-07-30 MED ORDER — CEFAZOLIN SODIUM-DEXTROSE 2-4 GM/100ML-% IV SOLN
INTRAVENOUS | Status: AC
  Filled 2020-07-30: qty 100

## 2020-07-30 MED ORDER — CEFAZOLIN SODIUM-DEXTROSE 2-4 GM/100ML-% IV SOLN
2.0000 g | INTRAVENOUS | Status: AC
  Administered 2020-07-30: 2 g via INTRAVENOUS

## 2020-07-30 MED ORDER — ONDANSETRON HCL 4 MG/2ML IJ SOLN
INTRAMUSCULAR | Status: DC | PRN
  Administered 2020-07-30: 4 mg via INTRAVENOUS

## 2020-07-30 MED ORDER — PHENYLEPHRINE HCL-NACL 10-0.9 MG/250ML-% IV SOLN
INTRAVENOUS | Status: DC | PRN
  Administered 2020-07-30: 35 ug/min via INTRAVENOUS

## 2020-07-30 MED ORDER — HYDROCODONE-ACETAMINOPHEN 5-325 MG PO TABS
1.0000 | ORAL_TABLET | Freq: Four times a day (QID) | ORAL | 0 refills | Status: DC | PRN
Start: 2020-07-30 — End: 2020-08-27

## 2020-07-30 MED ORDER — DEXAMETHASONE SODIUM PHOSPHATE 4 MG/ML IJ SOLN
INTRAMUSCULAR | Status: DC | PRN
  Administered 2020-07-30: 4 mg via INTRAVENOUS

## 2020-07-30 MED ORDER — PROPOFOL 500 MG/50ML IV EMUL
INTRAVENOUS | Status: DC | PRN
  Administered 2020-07-30: 35 ug/kg/min via INTRAVENOUS

## 2020-07-30 MED ORDER — FENTANYL CITRATE (PF) 100 MCG/2ML IJ SOLN
INTRAMUSCULAR | Status: AC
  Filled 2020-07-30: qty 2

## 2020-07-30 MED ORDER — LIDOCAINE HCL (CARDIAC) PF 100 MG/5ML IV SOSY
PREFILLED_SYRINGE | INTRAVENOUS | Status: DC | PRN
  Administered 2020-07-30: 60 mg via INTRAVENOUS

## 2020-07-30 MED ORDER — LIDOCAINE-EPINEPHRINE 1 %-1:100000 IJ SOLN
INTRAMUSCULAR | Status: AC
  Filled 2020-07-30: qty 1

## 2020-07-30 MED ORDER — PROMETHAZINE HCL 25 MG/ML IJ SOLN: 6.2500 mg | INTRAMUSCULAR | Status: DC | PRN

## 2020-07-30 MED ORDER — FENTANYL CITRATE (PF) 100 MCG/2ML IJ SOLN
INTRAMUSCULAR | Status: DC | PRN
  Administered 2020-07-30: 50 ug via INTRAVENOUS
  Administered 2020-07-30: 100 ug via INTRAVENOUS
  Administered 2020-07-30: 50 ug via INTRAVENOUS

## 2020-07-30 MED ORDER — AMISULPRIDE (ANTIEMETIC) 5 MG/2ML IV SOLN: 10.0000 mg | Freq: Once | INTRAVENOUS | Status: DC | PRN

## 2020-07-30 MED ORDER — SUCCINYLCHOLINE CHLORIDE 20 MG/ML IJ SOLN
INTRAMUSCULAR | Status: DC | PRN
  Administered 2020-07-30: 120 mg via INTRAVENOUS

## 2020-07-30 MED ORDER — OXYCODONE HCL 5 MG PO TABS
ORAL_TABLET | ORAL | Status: AC
  Filled 2020-07-30: qty 1

## 2020-07-30 MED ORDER — OXYCODONE HCL 5 MG/5ML PO SOLN: 5.0000 mg | Freq: Once | ORAL | Status: AC | PRN

## 2020-07-30 MED ORDER — LACTATED RINGERS IV SOLN: INTRAVENOUS | Status: DC

## 2020-07-30 MED ORDER — HYDROMORPHONE HCL 1 MG/ML IJ SOLN: 0.2500 mg | INTRAMUSCULAR | Status: DC | PRN

## 2020-07-30 MED ORDER — PROPOFOL 10 MG/ML IV BOLUS
INTRAVENOUS | Status: DC | PRN
  Administered 2020-07-30: 100 mg via INTRAVENOUS

## 2020-07-30 MED ORDER — OXYCODONE HCL 5 MG PO TABS
5.0000 mg | ORAL_TABLET | Freq: Once | ORAL | Status: AC | PRN
  Administered 2020-07-30: 5 mg via ORAL

## 2020-07-30 MED ORDER — LIDOCAINE-EPINEPHRINE 1 %-1:100000 IJ SOLN
INTRAMUSCULAR | Status: DC | PRN
  Administered 2020-07-30: 5.5 mL

## 2020-07-30 SURGICAL SUPPLY — 74 items
ACC NRSTM 4 TRQ WRNCH STRL (MISCELLANEOUS)
ADH SKN CLS APL DERMABOND .7 (GAUZE/BANDAGES/DRESSINGS) ×2
BAG DECANTER FOR FLEXI CONT (MISCELLANEOUS) IMPLANT
BLADE CLIPPER SURG (BLADE) IMPLANT
BLADE SURG 15 STRL LF DISP TIS (BLADE) ×1 IMPLANT
BLADE SURG 15 STRL SS (BLADE) ×3
CANISTER SUCT 1200ML W/VALVE (MISCELLANEOUS) ×3 IMPLANT
CORD BIPOLAR FORCEPS 12FT (ELECTRODE) ×3 IMPLANT
COVER PROBE W GEL 5X96 (DRAPES) ×3 IMPLANT
COVER WAND RF STERILE (DRAPES) ×3 IMPLANT
DERMABOND ADVANCED (GAUZE/BANDAGES/DRESSINGS) ×4
DERMABOND ADVANCED .7 DNX12 (GAUZE/BANDAGES/DRESSINGS) ×2 IMPLANT
DRAPE C-ARM 35X43 STRL (DRAPES) ×3 IMPLANT
DRAPE HEAD BAR (DRAPES) IMPLANT
DRAPE INCISE IOBAN 66X45 STRL (DRAPES) ×3 IMPLANT
DRAPE MICROSCOPE WILD 40.5X102 (DRAPES) ×3 IMPLANT
DRAPE UTILITY XL STRL (DRAPES) ×3 IMPLANT
DRSG TEGADERM 2-3/8X2-3/4 SM (GAUZE/BANDAGES/DRESSINGS) ×3 IMPLANT
DRSG TEGADERM 4X4.75 (GAUZE/BANDAGES/DRESSINGS) ×3 IMPLANT
ELECT COATED BLADE 2.86 ST (ELECTRODE) ×3 IMPLANT
ELECT EMG 18 NIMS (NEUROSURGERY SUPPLIES) ×3
ELECT REM PT RETURN 9FT ADLT (ELECTROSURGICAL) ×3
ELECTRODE EMG 18 NIMS (NEUROSURGERY SUPPLIES) ×1 IMPLANT
ELECTRODE REM PT RTRN 9FT ADLT (ELECTROSURGICAL) ×1 IMPLANT
FORCEPS BIPOLAR SPETZLER 8 1.0 (NEUROSURGERY SUPPLIES) ×3 IMPLANT
GAUZE 4X4 16PLY RFD (DISPOSABLE) ×3 IMPLANT
GAUZE SPONGE 4X4 12PLY STRL (GAUZE/BANDAGES/DRESSINGS) ×3 IMPLANT
GENERATOR PULSE INSPIRE (Generator) ×3 IMPLANT
GLOVE BIO SURGEON STRL SZ 6.5 (GLOVE) IMPLANT
GLOVE BIO SURGEON STRL SZ7 (GLOVE) ×3 IMPLANT
GLOVE BIO SURGEON STRL SZ7.5 (GLOVE) ×3 IMPLANT
GLOVE BIO SURGEONS STRL SZ 6.5 (GLOVE)
GLOVE BIOGEL PI IND STRL 6.5 (GLOVE) ×1 IMPLANT
GLOVE BIOGEL PI IND STRL 7.0 (GLOVE) ×1 IMPLANT
GLOVE BIOGEL PI IND STRL 7.5 (GLOVE) ×1 IMPLANT
GLOVE BIOGEL PI INDICATOR 6.5 (GLOVE) ×2
GLOVE BIOGEL PI INDICATOR 7.0 (GLOVE) ×2
GLOVE BIOGEL PI INDICATOR 7.5 (GLOVE) ×2
GLOVE ECLIPSE 6.5 STRL STRAW (GLOVE) ×3 IMPLANT
GOWN STRL REUS W/ TWL LRG LVL3 (GOWN DISPOSABLE) ×3 IMPLANT
GOWN STRL REUS W/TWL LRG LVL3 (GOWN DISPOSABLE) ×9
IV CATH 18G SAFETY (IV SOLUTION) ×3 IMPLANT
KIT NEURO ACCESSORY W/WRENCH (MISCELLANEOUS) IMPLANT
LEAD SENSING RESP INSPIRE (Lead) ×3 IMPLANT
LEAD SLEEP STIMULATION INSPIRE (Lead) ×3 IMPLANT
LOOP VESSEL MAXI BLUE (MISCELLANEOUS) ×3 IMPLANT
LOOP VESSEL MINI RED (MISCELLANEOUS) ×3 IMPLANT
MARKER SKIN DUAL TIP RULER LAB (MISCELLANEOUS) ×6 IMPLANT
NEEDLE HYPO 25X1 1.5 SAFETY (NEEDLE) ×3 IMPLANT
NS IRRIG 1000ML POUR BTL (IV SOLUTION) ×3 IMPLANT
PACK BASIN DAY SURGERY FS (CUSTOM PROCEDURE TRAY) ×3 IMPLANT
PACK ENT DAY SURGERY (CUSTOM PROCEDURE TRAY) ×3 IMPLANT
PASSER CATH 36 CODMAN DISP (NEUROSURGERY SUPPLIES) IMPLANT
PASSER CATH 38CM DISP (INSTRUMENTS) IMPLANT
PENCIL SMOKE EVACUATOR (MISCELLANEOUS) ×3 IMPLANT
PROBE NERVE STIMULATOR (NEUROSURGERY SUPPLIES) ×3 IMPLANT
REMOTE CONTROL SLEEP INSPIRE (MISCELLANEOUS) ×3 IMPLANT
SET WALTER ACTIVATION W/DRAPE (SET/KITS/TRAYS/PACK) ×3 IMPLANT
SLING ARM FOAM STRAP LRG (SOFTGOODS) IMPLANT
SLING ARM FOAM STRAP MED (SOFTGOODS) IMPLANT
SPONGE INTESTINAL PEANUT (DISPOSABLE) ×3 IMPLANT
STAPLER VISISTAT 35W (STAPLE) IMPLANT
SUT SILK 2 0 SH (SUTURE) ×3 IMPLANT
SUT SILK 3 0 REEL (SUTURE) ×3 IMPLANT
SUT SILK 3 0 SH 30 (SUTURE) ×6 IMPLANT
SUT SILK 3-0 (SUTURE) ×3
SUT SILK 3-0 RB1 30XBRD (SUTURE) ×1
SUT VIC AB 3-0 SH 27 (SUTURE) ×6
SUT VIC AB 3-0 SH 27X BRD (SUTURE) ×2 IMPLANT
SUT VIC AB 4-0 PS2 27 (SUTURE) ×6 IMPLANT
SUTURE SILK 3-0 RB1 30XBRD (SUTURE) ×1 IMPLANT
SYR 10ML LL (SYRINGE) ×3 IMPLANT
SYR BULB EAR ULCER 3OZ GRN STR (SYRINGE) ×3 IMPLANT
TOWEL GREEN STERILE FF (TOWEL DISPOSABLE) ×6 IMPLANT

## 2020-07-30 NOTE — Anesthesia Preprocedure Evaluation (Signed)
Anesthesia Evaluation  Patient identified by MRN, date of birth, ID band Patient awake    Reviewed: Allergy & Precautions, H&P , NPO status , Patient's Chart, lab work & pertinent test results  Airway Mallampati: II  TM Distance: >3 FB Neck ROM: Full    Dental no notable dental hx. (+) Teeth Intact, Dental Advisory Given   Pulmonary sleep apnea , former smoker,    Pulmonary exam normal breath sounds clear to auscultation       Cardiovascular hypertension, Pt. on medications + CAD, + Past MI and + CABG  + dysrhythmias Atrial Fibrillation  Rhythm:Regular Rate:Normal     Neuro/Psych  Headaches, negative psych ROS   GI/Hepatic negative GI ROS, Neg liver ROS,   Endo/Other  negative endocrine ROS  Renal/GU negative Renal ROS  negative genitourinary   Musculoskeletal  (+) Arthritis , Osteoarthritis,    Abdominal (+) + obese,   Peds  Hematology  (+) Blood dyscrasia, anemia ,   Anesthesia Other Findings   Reproductive/Obstetrics negative OB ROS                             Anesthesia Physical  Anesthesia Plan  ASA: III  Anesthesia Plan: General   Post-op Pain Management:    Induction: Intravenous  PONV Risk Score and Plan: 2 and Ondansetron, Midazolam and Treatment may vary due to age or medical condition  Airway Management Planned: Oral ETT  Additional Equipment:   Intra-op Plan:   Post-operative Plan: Extubation in OR  Informed Consent: I have reviewed the patients History and Physical, chart, labs and discussed the procedure including the risks, benefits and alternatives for the proposed anesthesia with the patient or authorized representative who has indicated his/her understanding and acceptance.     Dental advisory given  Plan Discussed with: CRNA  Anesthesia Plan Comments:         Anesthesia Quick Evaluation

## 2020-07-30 NOTE — Brief Op Note (Signed)
07/30/2020  10:26 AM  PATIENT:  [name] Brown  69 y.o. male  PRE-OPERATIVE DIAGNOSIS:  OSA (obstructive sleep apnea)  POST-OPERATIVE DIAGNOSIS:  OSA (obstructive sleep apnea)  PROCEDURE:  Procedure(s): IMPLANTATION OF HYPOGLOSSAL NERVE STIMULATOR Implantation of Chest wall respirator Sensor Electrode Electronic Analysis of Implanted Neurostimulator Pulse Generator (Right)  SURGEON:  Surgeon(s) and Role:    Melida Quitter, MD - Primary  PHYSICIAN ASSISTANT:   ASSISTANTS: none   ANESTHESIA:   general  EBL:  25 cc  BLOOD ADMINISTERED:none  DRAINS: none   LOCAL MEDICATIONS USED:  LIDOCAINE   SPECIMEN:  No Specimen  DISPOSITION OF SPECIMEN:  N/A  COUNTS:  YES  TOURNIQUET:  * No tourniquets in log *  DICTATION: .Note written in EPIC  PLAN OF CARE: Discharge to home after PACU  PATIENT DISPOSITION:  PACU - hemodynamically stable.   Delay start of Pharmacological VTE agent (>24hrs) due to surgical blood loss or risk of bleeding: yes

## 2020-07-30 NOTE — Discharge Instructions (Signed)

## 2020-07-30 NOTE — Transfer of Care (Signed)
Immediate Anesthesia Transfer of Care Note  Patient: [name] Brown  Procedure(s) Performed: IMPLANTATION OF HYPOGLOSSAL NERVE STIMULATOR Implantation of Chest wall respirator Sensor Electrode Electronic Analysis of Implanted Neurostimulator Pulse Generator (Right Neck)  Patient Location: PACU  Anesthesia Type:General  Level of Consciousness: drowsy and patient cooperative  Airway & Oxygen Therapy: Patient Spontanous Breathing and Patient connected to face mask oxygen  Post-op Assessment: Report given to RN and Post -op Vital signs reviewed and stable  Post vital signs: Reviewed and stable  Last Vitals:  Vitals Value Taken Time  BP    Temp    Pulse 55 07/30/20 1043  Resp    SpO2 96 % 07/30/20 1043    Last Pain:  Vitals:   07/30/20 0723  TempSrc: Oral  PainSc: 0-No pain      Patients Stated Pain Goal: 5 (95/74/73 4037)  Complications: No complications documented.

## 2020-07-30 NOTE — Anesthesia Procedure Notes (Signed)
Procedure Name: Intubation Date/Time: 07/30/2020 8:37 AM Performed by: Signe Colt, CRNA Pre-anesthesia Checklist: Patient identified, Emergency Drugs available, Suction available and Patient being monitored Patient Re-evaluated:Patient Re-evaluated prior to induction Oxygen Delivery Method: Circle system utilized Preoxygenation: Pre-oxygenation with 100% oxygen Induction Type: IV induction Ventilation: Mask ventilation without difficulty Laryngoscope Size: Mac, 3 and Glidescope Grade View: Grade III Tube type: Oral Tube size: 7.0 mm Number of attempts: 3 Airway Equipment and Method: Stylet and Oral airway Placement Confirmation: ETT inserted through vocal cords under direct vision,  positive ETCO2 and breath sounds checked- equal and bilateral Secured at: 21 cm Tube secured with: Tape Dental Injury: Teeth and Oropharynx as per pre-operative assessment  Comments: Smooth IV induction, easy mask airway, DL x 1 by T Sadira Standard CRNA unable to place ETT grade 3 View, DL x 1 by Royetta Car MDA also unable to see or place ETT, DL x 1 with glide scope per TB vocal cords visualized easy placement of 7.0 ETT, =BBS, + ETCO2, Teeth and lips unchanged

## 2020-07-30 NOTE — H&P (Signed)
[name] Brown is an 69 y.o. male.   Chief Complaint: Sleep apnea HPI: 69 year old male with obstructive sleep apnea who has been unable to tolerate CPAP.  He presents for hypoglossal nerve stimulator placement.  Past Medical History:  Diagnosis Date  . Aortic aneurysm ([place]) Endograf 2009  . Arthritis   . Atrial fibrillation ([place])   . Bradycardia   . CAD (coronary artery disease) CABG 03/2000   Median sternotomy for coronary artery bypass grafting x 3 (left  . HTN (hypertension)   . Hx of CABG 2001   severe 3 vessel disease  . Hx of colonic polyp 06/2010   Hyperplastic   . Hyperlipidemia   . Myocardial infarction ([place])   . Sleep apnea     Past Surgical History:  Procedure Laterality Date  . ATRIAL FIBRILLATION ABLATION N/A 03/19/2020   Procedure: ATRIAL FIBRILLATION ABLATION;  Surgeon: Constance Haw, MD;  Location: [place] CV LAB;  Service: Cardiovascular;  Laterality: N/A;  . CARDIOVERSION N/A 11/28/2018   Procedure: CARDIOVERSION;  Surgeon: Elouise Munroe, MD;  Location: [facility] ENDOSCOPY;  Service: Cardiovascular;  Laterality: N/A;  . COLONOSCOPY    . CORONARY ARTERY BYPASS GRAFT     AAA stent graft on 12/01/2007 at [facility]  coronary bypass in 2001. Dr. Roxy Manns.  Marland Kitchen DRUG INDUCED ENDOSCOPY Right 02/18/2020   Procedure: DRUG INDUCED ENDOSCOPY;  Surgeon: Melida Quitter, MD;  Location: [place];  Service: ENT;  Laterality: Right;  . FOOT SURGERY Left 07/19/2017  . LEFT HEART CATHETERIZATION WITH CORONARY ANGIOGRAM N/A 10/26/2013   Procedure: LEFT HEART CATHETERIZATION WITH CORONARY ANGIOGRAM;  Surgeon: Peter M Martinique, MD;  Location: [facility] CATH LAB;  Service: Cardiovascular;  Laterality: N/A;  . MASS EXCISION Right 08/10/2019   Procedure: EXCISION OF CYST FROM RIGHT SCALP;  Surgeon: Jovita Kussmaul, MD;  Location: [place];  Service: General;  Laterality: Right;    Family History  Problem Relation Age of Onset  . Heart  attack Mother   . Heart disease Mother   . Heart attack Father   . Heart disease Father   . Rectal cancer Neg Hx   . Stomach cancer Neg Hx   . Pancreatic cancer Neg Hx   . Esophageal cancer Neg Hx   . Colon polyps Neg Hx   . Colon cancer Neg Hx    Social History:  reports that he quit smoking about 28 years ago. His smoking use included cigarettes. He has a 60.00 pack-year smoking history. He has never used smokeless tobacco. He reports that he does not drink alcohol and does not use drugs.  Allergies:  Allergies  Allergen Reactions  . Other Other (See Comments)    Myalgias with elevated CPKs  . Statins     Myalgias with elevated CPKs    Medications Prior to Admission  Medication Sig Dispense Refill  . apixaban (ELIQUIS) 5 MG TABS tablet Take 1 tablet (5 mg total) by mouth 2 (two) times daily. 180 tablet 2  . aspirin EC 81 MG EC tablet Take 1 tablet (81 mg total) by mouth daily.    . Biotin 10000 MCG TABS Take 10,000 mcg by mouth daily.    . cholecalciferol (VITAMIN D) 25 MCG (1000 UT) tablet Take 1,000 Units by mouth daily.     . divalproex (DEPAKOTE ER) 500 MG 24 hr tablet TAKE 1 TABLET(500 MG) BY MOUTH DAILY 90 tablet 1  . doxycycline (VIBRAMYCIN) 50 MG  capsule Take 50 mg by mouth 2 (two) times daily.    Marland Kitchen ezetimibe (ZETIA) 10 MG tablet Take 1 tablet (10 mg total) by mouth daily. 90 tablet 3  . furosemide (LASIX) 20 MG tablet TAKE 1 TABLET(20 MG) BY MOUTH TWICE DAILY 180 tablet 1  . gabapentin (NEURONTIN) 300 MG capsule TAKE 1 CAPSULE(300 MG) BY MOUTH TWICE DAILY 60 capsule 2  . Glucosamine-Chondroitin (GLUCOSAMINE CHONDR COMPLEX PO) Take 500 mg by mouth 2 (two) times daily.     . hydrALAZINE (APRESOLINE) 25 MG tablet Take 1 tablet (25 mg total) by mouth in the morning and at bedtime. 270 tablet 2  . isosorbide mononitrate (IMDUR) 30 MG 24 hr tablet TAKE 1 TABLET BY MOUTH EVERY DAY 90 tablet 2  . levocetirizine (XYZAL) 5 MG tablet Take 1 tablet (5 mg total) by mouth every  evening. 90 tablet 3  . Multiple Vitamin (MULTIVITAMIN) capsule Take 1 capsule by mouth daily.    . niacin (NIASPAN) 1000 MG CR tablet Take 1,000 mg by mouth 2 (two) times daily.      . Omega-3 Fatty Acids (FISH OIL) 1200 MG CAPS Take 1,200 mg by mouth 2 (two) times daily.    . potassium chloride SA (KLOR-CON) 20 MEQ tablet TAKE 1 TABLET BY MOUTH EVERY DAY 90 tablet 3  . ramipril (ALTACE) 10 MG capsule TAKE 1 CAPSULE BY MOUTH TWICE DAILY 180 capsule 3  . tamsulosin (FLOMAX) 0.4 MG CAPS capsule TAKE ONE CAPSULE BY MOUTH DAILY 90 capsule 3  . vitamin C (ASCORBIC ACID) 500 MG tablet Take 500 mg by mouth daily.    Marland Kitchen zolpidem (AMBIEN CR) 12.5 MG CR tablet TAKE 1 TABLET(12.5 MG) BY MOUTH AT BEDTIME AS NEEDED FOR SLEEP 30 tablet 5  . diclofenac sodium (VOLTAREN) 1 % GEL Apply 2 g topically 4 (four) times daily. 100 g 3  . nitroGLYCERIN (NITROSTAT) 0.4 MG SL tablet DISSOLVE 1 TABLET UNDER THE TONGUE EVERY 5 MINUTES FOR 3 DOSES AS NEEDED FOR CHEST PAIN 25 tablet 4  . tiZANidine (ZANAFLEX) 4 MG tablet Take 1 tablet (4 mg total) by mouth every 8 (eight) hours as needed for muscle spasms. 270 tablet 3    Results for orders placed or performed during the hospital encounter of 07/30/20 (from the past 48 hour(s))  Basic metabolic panel     Status: Abnormal   Collection Time: 07/30/20  7:17 AM  Result Value Ref Range   Sodium 140 135 - 145 mmol/L   Potassium 4.3 3.5 - 5.1 mmol/L   Chloride 104 98 - 111 mmol/L   CO2 25 22 - 32 mmol/L   Glucose, Bld 99 70 - 99 mg/dL    Comment: Glucose reference range applies only to samples taken after fasting for at least 8 hours.   BUN 26 (H) 8 - 23 mg/dL   Creatinine, Ser 1.43 (H) 0.61 - 1.24 mg/dL   Calcium 9.5 8.9 - 10.3 mg/dL   GFR, Estimated 50 (L) >60 mL/min   Anion gap 11 5 - 15    Comment: Performed at [facility] [address], [place], [place] [address]   No results found.  Review of Systems  All other systems reviewed and are  negative.   Blood pressure (!) 141/84, temperature 97.7 F (36.5 C), temperature source Oral, resp. rate 20, height 5\' 10"  (1.778 m), weight 95.7 kg, SpO2 95 %. Physical Exam Constitutional:      Appearance: Normal appearance. He is normal weight.  HENT:     Head: Normocephalic and atraumatic.     Right Ear: External ear normal.     Left Ear: External ear normal.     Nose: Nose normal.     Mouth/Throat:     Mouth: Mucous membranes are moist.     Pharynx: Oropharynx is clear.  Eyes:     Extraocular Movements: Extraocular movements intact.     Conjunctiva/sclera: Conjunctivae normal.     Pupils: Pupils are equal, round, and reactive to light.  Cardiovascular:     Rate and Rhythm: Normal rate.  Pulmonary:     Effort: Pulmonary effort is normal.  Skin:    General: Skin is warm and dry.  Neurological:     General: No focal deficit present.     Mental Status: He is alert and oriented to person, place, and time.  Psychiatric:        Mood and Affect: Mood normal.        Behavior: Behavior normal.        Thought Content: Thought content normal.        Judgment: Judgment normal.      Assessment/Plan Obstructive sleep apnea  To OR for hypoglossal nerve stimulator placement.  Melida Quitter, MD 07/30/2020, 8:16 AM

## 2020-07-30 NOTE — Op Note (Signed)

## 2020-07-31 ENCOUNTER — Encounter (HOSPITAL_BASED_OUTPATIENT_CLINIC_OR_DEPARTMENT_OTHER): Payer: Self-pay | Admitting: Otolaryngology

## 2020-07-31 NOTE — Anesthesia Postprocedure Evaluation (Signed)
Anesthesia Post Note  Patient: [name] Brown  Procedure(s) Performed: IMPLANTATION OF HYPOGLOSSAL NERVE STIMULATOR Implantation of Chest wall respirator Sensor Electrode Electronic Analysis of Implanted Neurostimulator Pulse Generator (Right Neck)     Patient location during evaluation: PACU Anesthesia Type: General Level of consciousness: awake and alert Pain management: pain level controlled Vital Signs Assessment: post-procedure vital signs reviewed and stable Respiratory status: spontaneous breathing, nonlabored ventilation and respiratory function stable Cardiovascular status: blood pressure returned to baseline and stable Postop Assessment: no apparent nausea or vomiting Anesthetic complications: no   No complications documented.  Last Vitals:  Vitals:   07/30/20 1230 07/30/20 1240  BP:  (!) 154/66  Pulse:  (!) 55  Resp:  16  Temp:  36.4 C  SpO2: 94% 94%    Last Pain:  Vitals:   07/30/20 1240  TempSrc:   PainSc: [place]

## 2020-08-01 ENCOUNTER — Other Ambulatory Visit: Payer: Self-pay

## 2020-08-01 ENCOUNTER — Ambulatory Visit: Payer: PPO | Admitting: Cardiovascular Disease

## 2020-08-01 ENCOUNTER — Encounter: Payer: Self-pay | Admitting: Cardiovascular Disease

## 2020-08-01 ENCOUNTER — Telehealth: Payer: Self-pay | Admitting: Cardiovascular Disease

## 2020-08-01 VITALS — BP 138/62 | HR 56 | Ht 70.0 in | Wt 215.0 lb

## 2020-08-01 DIAGNOSIS — R0989 Other specified symptoms and signs involving the circulatory and respiratory systems: Secondary | ICD-10-CM

## 2020-08-01 DIAGNOSIS — I480 Paroxysmal atrial fibrillation: Secondary | ICD-10-CM | POA: Diagnosis not present

## 2020-08-01 DIAGNOSIS — G4733 Obstructive sleep apnea (adult) (pediatric): Secondary | ICD-10-CM | POA: Diagnosis not present

## 2020-08-01 NOTE — Telephone Encounter (Signed)
Informed patient that Ramipril is what he is suppose to stop taking. If his BP is elevated, per Dr. Johnsie Cancel he will need to go back to increased dose of hydralazine 50 BID. Patient will stay on Hydralazine 25 mg BID for now. Patient verbalized understanding.

## 2020-08-01 NOTE — Patient Instructions (Addendum)
Medication Instructions:  Your physician has recommended you make the following change in your medication: 1-STOP Ramipril   *If you need a refill on your cardiac medications before your next appointment, please call your pharmacy*  Lab Work: If you have labs (blood work) drawn today and your tests are completely normal, you will receive your results only by: Marland Kitchen MyChart Message (if you have MyChart) OR . A paper copy in the mail If you have any lab test that is abnormal or we need to change your treatment, we will call you to review the results.   Testing/Procedures: Your physician has requested that you have a carotid duplex in April. This test is an ultrasound of the carotid arteries in your neck. It looks at blood flow through these arteries that supply the brain with blood. Allow one hour for this exam. There are no restrictions or special instructions.  Follow-Up: At [facility], you and your health needs are our priority.  As part of our continuing mission to provide you with exceptional heart care, we have created designated Provider Care Teams.  These Care Teams include your primary Cardiologist (physician) and Advanced Practice Providers (APPs -  Physician Assistants and Nurse Practitioners) who all work together to provide you with the care you need, when you need it.  We recommend signing up for the patient portal called "MyChart".  Sign up information is provided on this After Visit Summary.  MyChart is used to connect with patients for Virtual Visits (Telemedicine).  Patients are able to view lab/test results, encounter notes, upcoming appointments, etc.  Non-urgent messages can be sent to your provider as well.   To learn more about what you can do with MyChart, go to [email or web address].    Your next appointment:   6 month(s)  The format for your next appointment:   In Person  Provider:   You may see Jenkins Rouge, MD or one of the following Advanced Practice  Providers on your designated Care Team:    Truitt Merle, NP  Cecilie Kicks, NP  Kathyrn Drown, NP

## 2020-08-01 NOTE — Telephone Encounter (Signed)
Pt c/o medication issue:  1. Name of Medication:  hydrALAZINE (APRESOLINE) 25 MG tablet Ramapril  2. How are you currently taking this medication (dosage and times per day)? 1 tablet twice a day ramapril, 1 tablet daily hydralazine  3. Are you having a reaction (difficulty breathing--STAT)? no  4. What is your medication issue? Patient states he would like to clarify what medication he is supposed to be stopping.

## 2020-08-04 ENCOUNTER — Other Ambulatory Visit: Payer: Self-pay | Admitting: Cardiovascular Disease

## 2020-08-27 ENCOUNTER — Ambulatory Visit: Payer: PPO | Admitting: Cardiology

## 2020-08-27 ENCOUNTER — Encounter: Payer: Self-pay | Admitting: Cardiology

## 2020-08-27 ENCOUNTER — Telehealth: Payer: Self-pay

## 2020-08-27 ENCOUNTER — Other Ambulatory Visit: Payer: Self-pay

## 2020-08-27 VITALS — BP 114/60 | HR 51 | Ht 70.0 in | Wt 214.0 lb

## 2020-08-27 DIAGNOSIS — I10 Essential (primary) hypertension: Secondary | ICD-10-CM | POA: Diagnosis not present

## 2020-08-27 DIAGNOSIS — Z4542 Encounter for adjustment and management of neuropacemaker (brain) (peripheral nerve) (spinal cord): Secondary | ICD-10-CM

## 2020-08-27 DIAGNOSIS — G4733 Obstructive sleep apnea (adult) (pediatric): Secondary | ICD-10-CM

## 2020-08-27 DIAGNOSIS — I480 Paroxysmal atrial fibrillation: Secondary | ICD-10-CM | POA: Diagnosis not present

## 2020-08-27 NOTE — Progress Notes (Signed)
Date:  09/07/2020   ID:  [name], DOB 26-Jul-1951, MRN [id number]  PCP:  Hoyt Koch, MD  Cardiologist:  Jenkins Rouge, MD  Sleep Medicine:  Leanora Cover, MD Electrophysiologist:  Will Meredith Leeds, MD   Chief Complaint:  OSA  History of Present Illness:    [name] Brown is a 69 y.o. male  With a hx of afib, SSS, CAD.  He has a hx of OSA with home sleep study documenting this in 2018.  He was placed on CPAP but at that time did not tolerate CPAP therapy.  He was using a nasal mask or nasal pillow mask but it bothered him so much he could not use it.  He was referred to Dr. Ron Parker for an oral device but apparently needed implants put in because he does not have enough teeth but it was cost prohibitive.   He decided to retry CPAP and underwent CPAP tiration in 08/2019.  Due to ongoing respiratory events he could not be adequately titrated on CPAP and was changed to BiPAP.  He again could not tolerate BiPAP.   Due to problems with excessive daytime sleepiness he was referred to ENT to be evaluated for left hypoglossal nerve stimulator.    He underwent Hypoglossal N stimulator placement 07/30/2020 and is now here for activation of his device.  He denies any problems with his surgical wounds which are well healed.  No tongue soreness.  He is anxious to get his device working.   Prior CV studies:   The following studies were reviewed today:  Sleep study  Past Medical History:  Diagnosis Date  . Aortic aneurysm ([place]) Endograf 2009  . Arthritis   . Atrial fibrillation ([place])   . Bradycardia   . CAD (coronary artery disease) CABG 03/2000   Median sternotomy for coronary artery bypass grafting x 3 (left  . HTN (hypertension)   . Hx of CABG 2001   severe 3 vessel disease  . Hx of colonic polyp 06/2010   Hyperplastic   . Hyperlipidemia   . Myocardial infarction ([place])   . Sleep apnea    Past Surgical History:  Procedure Laterality Date  . ATRIAL FIBRILLATION ABLATION  N/A 03/19/2020   Procedure: ATRIAL FIBRILLATION ABLATION;  Surgeon: Constance Haw, MD;  Location: [place] CV LAB;  Service: Cardiovascular;  Laterality: N/A;  . CARDIOVERSION N/A 11/28/2018   Procedure: CARDIOVERSION;  Surgeon: Elouise Munroe, MD;  Location: [facility] ENDOSCOPY;  Service: Cardiovascular;  Laterality: N/A;  . COLONOSCOPY    . CORONARY ARTERY BYPASS GRAFT     AAA stent graft on 12/01/2007 at [facility]  coronary bypass in 2001. Dr. Roxy Manns.  Marland Kitchen DRUG INDUCED ENDOSCOPY Right 02/18/2020   Procedure: DRUG INDUCED ENDOSCOPY;  Surgeon: Melida Quitter, MD;  Location: [place];  Service: ENT;  Laterality: Right;  . FOOT SURGERY Left 07/19/2017  . IMPLANTATION OF HYPOGLOSSAL NERVE STIMULATOR Right 07/30/2020   Procedure: IMPLANTATION OF HYPOGLOSSAL NERVE STIMULATOR Implantation of Chest wall respirator Sensor Electrode Electronic Analysis of Implanted Neurostimulator Pulse Generator;  Surgeon: Melida Quitter, MD;  Location: [place];  Service: ENT;  Laterality: Right;  . LEFT HEART CATHETERIZATION WITH CORONARY ANGIOGRAM N/A 10/26/2013   Procedure: LEFT HEART CATHETERIZATION WITH CORONARY ANGIOGRAM;  Surgeon: Peter M Martinique, MD;  Location: [facility] CATH LAB;  Service: Cardiovascular;  Laterality: N/A;  . MASS EXCISION Right 08/10/2019   Procedure: EXCISION OF CYST FROM RIGHT SCALP;  Surgeon: Autumn Messing III, MD;  Location: [place];  Service: General;  Laterality: Right;     Current Meds  Medication Sig  . apixaban (ELIQUIS) 5 MG TABS tablet Take 1 tablet (5 mg total) by mouth 2 (two) times daily.  Marland Kitchen aspirin EC 81 MG EC tablet Take 1 tablet (81 mg total) by mouth daily.  . Biotin 10000 MCG TABS Take 10,000 mcg by mouth daily.  . cholecalciferol (VITAMIN D) 25 MCG (1000 UT) tablet Take 1,000 Units by mouth daily.   . diclofenac sodium (VOLTAREN) 1 % GEL Apply 2 g topically 4 (four) times daily.  . divalproex (DEPAKOTE ER) 500  MG 24 hr tablet TAKE 1 TABLET(500 MG) BY MOUTH DAILY  . doxycycline (VIBRAMYCIN) 50 MG capsule Take 50 mg by mouth 2 (two) times daily.  Marland Kitchen ezetimibe (ZETIA) 10 MG tablet Take 1 tablet (10 mg total) by mouth daily.  . furosemide (LASIX) 20 MG tablet TAKE 1 TABLET(20 MG) BY MOUTH TWICE DAILY  . gabapentin (NEURONTIN) 300 MG capsule TAKE 1 CAPSULE(300 MG) BY MOUTH TWICE DAILY  . Glucosamine-Chondroitin (GLUCOSAMINE CHONDR COMPLEX PO) Take 500 mg by mouth 2 (two) times daily.   . hydrALAZINE (APRESOLINE) 25 MG tablet Take 1 tablet (25 mg total) by mouth in the morning and at bedtime.  . isosorbide mononitrate (IMDUR) 30 MG 24 hr tablet TAKE 1 TABLET BY MOUTH EVERY DAY  . levocetirizine (XYZAL) 5 MG tablet Take 1 tablet (5 mg total) by mouth every evening.  . Multiple Vitamin (MULTIVITAMIN) capsule Take 1 capsule by mouth daily.  . niacin (NIASPAN) 1000 MG CR tablet Take 1,000 mg by mouth 2 (two) times daily.    . nitroGLYCERIN (NITROSTAT) 0.4 MG SL tablet DISSOLVE 1 TABLET UNDER THE TONGUE EVERY 5 MINUTES FOR 3 DOSES AS NEEDED FOR CHEST PAIN  . Omega-3 Fatty Acids (FISH OIL) 1200 MG CAPS Take 1,200 mg by mouth 2 (two) times daily.  . potassium chloride SA (KLOR-CON) 20 MEQ tablet TAKE 1 TABLET BY MOUTH EVERY DAY  . ramipril (ALTACE) 10 MG capsule Take 10 mg by mouth daily.  . tamsulosin (FLOMAX) 0.4 MG CAPS capsule TAKE ONE CAPSULE BY MOUTH DAILY  . tiZANidine (ZANAFLEX) 4 MG tablet Take 1 tablet (4 mg total) by mouth every 8 (eight) hours as needed for muscle spasms.  . vitamin C (ASCORBIC ACID) 500 MG tablet Take 500 mg by mouth daily.  Marland Kitchen zolpidem (AMBIEN CR) 12.5 MG CR tablet TAKE 1 TABLET(12.5 MG) BY MOUTH AT BEDTIME AS NEEDED FOR SLEEP     Allergies:   Other and Statins   Social History   Tobacco Use  . Smoking status: Former Smoker    Packs/day: 2.00    Years: 30.00    Pack years: 60.00    Types: Cigarettes    Quit date: 01/05/1992    Years since quitting: 28.6  . Smokeless tobacco:  Never Used  . Tobacco comment: quit 19 yrs ago  Vaping Use  . Vaping Use: Never used  Substance Use Topics  . Alcohol use: No  . Drug use: No     Family Hx: The patient's family history includes Heart attack in his father and mother; Heart disease in his father and mother. There is no history of Rectal cancer, Stomach cancer, Pancreatic cancer, Esophageal cancer, Colon polyps, or Colon cancer.  ROS:   Please see the history of present illness.     All other systems reviewed and are negative.  Labs/Other Tests and Data Reviewed:    Recent Labs: 03/19/2020: Hemoglobin 10.8; Platelets 183 04/15/2020: ALT 18; TSH 1.233 07/30/2020: BUN 26; Creatinine, Ser 1.43; Potassium 4.3; Sodium 140   Recent Lipid Panel Lab Results  Component Value Date/Time   CHOL 114 11/29/2018 02:23 AM   TRIG 32 11/29/2018 02:23 AM   HDL 24 (L) 11/29/2018 02:23 AM   CHOLHDL 4.8 11/29/2018 02:23 AM   LDLCALC 84 11/29/2018 02:23 AM    Wt Readings from Last 3 Encounters:  08/27/20 214 lb (97.1 kg)  08/01/20 215 lb (97.5 kg)  07/30/20 210 lb 15.7 oz (95.7 kg)     Objective:    Vital Signs:  BP 114/60   Pulse (!) 51   Ht 5\' 10"  (1.778 m)   Wt 214 lb (97.1 kg)   SpO2 93%   BMI 30.71 kg/m    GEN: Well nourished, well developed in no acute distress HEENT: Normal NECK: No JVD; No carotid bruits LYMPHATICS: No lymphadenopathy CARDIAC:RRR, no murmurs, rubs, gallops RESPIRATORY:  Clear to auscultation without rales, wheezing or rhonchi  ABDOMEN: Soft, non-tender, non-distended MUSCULOSKELETAL:  No edema; No deformity  SKIN: Warm and dry NEUROLOGIC:  Alert and oriented x 3 PSYCHIATRIC:  Normal affect    ASSESSMENT & PLAN:    1.  OSA -he has had a sleep study in 2018 and was tried on CPAP but he did not tolerate the PAP both in 2018 and on retry in 2021. -the oral device was cost prohibitive due to needing major dental work prior to getting the device so that is not an option -now s/p hypoglossal  N stimulator -his device was turned on today and education provided on use of the device -parameters set as follows: -Threshold testing:  Sensation 2.1V and Functional 2.3V with tongue protrusion -Final settings prescribed:  Amplitude 2.3V  Patient control 2.3-3.3V  Pulse width 41mcs  Rate 33Hz   Start delay 30 min  Pause time 15 min  Therapy duration 8 hours  Electrodes +/-/+  2.  HTN -BP controlled -continue Hydralazine 25mg  BID, Ramipril 10mg  daily and Imdur 30mg  daily.  3.  PAF -he thinks he is maintaining NSR -continue Amio 200mg  daily, apixaban 5mg  BID   Medication Adjustments/Labs and Tests Ordered: Current medicines are reviewed at length with the patient today.  Concerns regarding medicines are outlined above.  Tests Ordered: Orders Placed This Encounter  Procedures  . Polysomnography 4 or more parameters   Medication Changes: No orders of the defined types were placed in this encounter.   Disposition:  Follow up in 4 weeks for virtual OV to assess how patient is doing then followup 4-6 weeks after that for in office device check and then set up in lab device titration 3 months from today  Signed, Fransico Him, MD  09/07/2020 9:30 AM    [place]

## 2020-08-27 NOTE — Patient Instructions (Addendum)
Medication Instructions:  Your physician recommends that you continue on your current medications as directed. Please refer to the Current Medication list given to you today.  *If you need a refill on your cardiac medications before your next appointment, please call your pharmacy*  Tests/Procedures: Your physician has recommended that you have a sleep study. This test records several body functions during sleep, including: brain activity, eye movement, oxygen and carbon dioxide blood levels, heart rate and rhythm, breathing rate and rhythm, the flow of air through your mouth and nose, snoring, body muscle movements, and chest and belly movement.  Follow-Up: At [facility], you and your health needs are our priority.  As part of our continuing mission to provide you with exceptional heart care, we have created designated Provider Care Teams.  These Care Teams include your primary Cardiologist (physician) and Advanced Practice Providers (APPs -  Physician Assistants and Nurse Practitioners) who all work together to provide you with the care you need, when you need it.  Your next appointment:   4 week(s)  The format for your next appointment:   Virtual Visit   Provider:   Fransico Him, MD

## 2020-08-27 NOTE — Telephone Encounter (Signed)
°  Patient Consent for Virtual Visit         [name] Brown has provided verbal consent on 08/27/2020 for a virtual visit (video or telephone).   CONSENT FOR VIRTUAL VISIT FOR:  [name] Brown  By participating in this virtual visit I agree to the following:  I hereby voluntarily request, consent and authorize [facility] and its employed or contracted physicians, physician assistants, nurse practitioners or other licensed health care professionals (the Practitioner), to provide me with telemedicine health care services (the Services") as deemed necessary by the treating Practitioner. I acknowledge and consent to receive the Services by the Practitioner via telemedicine. I understand that the telemedicine visit will involve communicating with the Practitioner through live audiovisual communication technology and the disclosure of certain medical information by electronic transmission. I acknowledge that I have been given the opportunity to request an in-person assessment or other available alternative prior to the telemedicine visit and am voluntarily participating in the telemedicine visit.  I understand that I have the right to withhold or withdraw my consent to the use of telemedicine in the course of my care at any time, without affecting my right to future care or treatment, and that the Practitioner or I may terminate the telemedicine visit at any time. I understand that I have the right to inspect all information obtained and/or recorded in the course of the telemedicine visit and may receive copies of available information for a reasonable fee.  I understand that some of the potential risks of receiving the Services via telemedicine include:   Delay or interruption in medical evaluation due to technological equipment failure or disruption;  Information transmitted may not be sufficient (e.g. poor resolution of images) to allow for appropriate medical decision making by the  Practitioner; and/or   In rare instances, security protocols could fail, causing a breach of personal health information.  Furthermore, I acknowledge that it is my responsibility to provide information about my medical history, conditions and care that is complete and accurate to the best of my ability. I acknowledge that Practitioner's advice, recommendations, and/or decision may be based on factors not within their control, such as incomplete or inaccurate data provided by me or distortions of diagnostic images or specimens that may result from electronic transmissions. I understand that the practice of medicine is not an exact science and that Practitioner makes no warranties or guarantees regarding treatment outcomes. I acknowledge that a copy of this consent can be made available to me via my patient portal ([facility]), or I can request a printed copy by calling the office of [facility].    I understand that my insurance will be billed for this visit.   I have read or had this consent read to me.  I understand the contents of this consent, which adequately explains the benefits and risks of the Services being provided via telemedicine.   I have been provided ample opportunity to ask questions regarding this consent and the Services and have had my questions answered to my satisfaction.  I give my informed consent for the services to be provided through the use of telemedicine in my medical care

## 2020-08-29 ENCOUNTER — Telehealth: Payer: Self-pay | Admitting: *Deleted

## 2020-08-29 DIAGNOSIS — G4733 Obstructive sleep apnea (adult) (pediatric): Secondary | ICD-10-CM

## 2020-08-29 NOTE — Telephone Encounter (Signed)
-----   Message from Antonieta Iba, RN sent at 08/27/2020 12:03 PM EST ----- Patient was seen today in the office by Dr. Radford Pax for [facility] activation. In-lab sleep study has been ordered that needs to be done in 90 days. Patient will need an in office follow up with Dr. Radford Pax and Dawna Part rep 2 weeks after sleep study.   Thanks!

## 2020-09-08 ENCOUNTER — Telehealth: Payer: Self-pay | Admitting: Pharmacist

## 2020-09-08 NOTE — Progress Notes (Signed)
Chronic Care Management Pharmacy Assistant   Name: [name] Brown  MRN: [id number] DOB: 23-Aug-1951  Reason for Encounter: Hyperlipidemia Adherence Call  Patient Questions:  1.  Have you seen any other providers since your last visit? Yes, patient last seen Dr. Ocie Doyne [facility] cardiologist on 06/19/20, ED visit on 07/30/20 and Dr. Jenkins [place] cardiologist on 08/01/20  2.  Any changes in your medicines or health? Yes, Dr. Curt Bears increased his Hydralazine to 25 mg and discontinued Amiodarone    PCP : Hoyt Koch, MD  Allergies:   Allergies  Allergen Reactions  . Other Other (See Comments)    Myalgias with elevated CPKs  . Statins     Myalgias with elevated CPKs    Medications: Outpatient Encounter Medications as of 09/08/2020  Medication Sig  . apixaban (ELIQUIS) 5 MG TABS tablet Take 1 tablet (5 mg total) by mouth 2 (two) times daily.  Marland Kitchen aspirin EC 81 MG EC tablet Take 1 tablet (81 mg total) by mouth daily.  . Biotin 10000 MCG TABS Take 10,000 mcg by mouth daily.  . cholecalciferol (VITAMIN D) 25 MCG (1000 UT) tablet Take 1,000 Units by mouth daily.   . diclofenac sodium (VOLTAREN) 1 % GEL Apply 2 g topically 4 (four) times daily.  . divalproex (DEPAKOTE ER) 500 MG 24 hr tablet TAKE 1 TABLET(500 MG) BY MOUTH DAILY  . doxycycline (VIBRAMYCIN) 50 MG capsule Take 50 mg by mouth 2 (two) times daily.  Marland Kitchen ezetimibe (ZETIA) 10 MG tablet Take 1 tablet (10 mg total) by mouth daily.  . furosemide (LASIX) 20 MG tablet TAKE 1 TABLET(20 MG) BY MOUTH TWICE DAILY  . gabapentin (NEURONTIN) 300 MG capsule TAKE 1 CAPSULE(300 MG) BY MOUTH TWICE DAILY  . Glucosamine-Chondroitin (GLUCOSAMINE CHONDR COMPLEX PO) Take 500 mg by mouth 2 (two) times daily.   . hydrALAZINE (APRESOLINE) 25 MG tablet Take 1 tablet (25 mg total) by mouth in the morning and at bedtime.  . isosorbide mononitrate (IMDUR) 30 MG 24 hr tablet TAKE 1 TABLET BY MOUTH EVERY DAY  . levocetirizine (XYZAL) 5 MG tablet  Take 1 tablet (5 mg total) by mouth every evening.  . Multiple Vitamin (MULTIVITAMIN) capsule Take 1 capsule by mouth daily.  . niacin (NIASPAN) 1000 MG CR tablet Take 1,000 mg by mouth 2 (two) times daily.    . nitroGLYCERIN (NITROSTAT) 0.4 MG SL tablet DISSOLVE 1 TABLET UNDER THE TONGUE EVERY 5 MINUTES FOR 3 DOSES AS NEEDED FOR CHEST PAIN  . Omega-3 Fatty Acids (FISH OIL) 1200 MG CAPS Take 1,200 mg by mouth 2 (two) times daily.  . potassium chloride SA (KLOR-CON) 20 MEQ tablet TAKE 1 TABLET BY MOUTH EVERY DAY  . ramipril (ALTACE) 10 MG capsule Take 10 mg by mouth daily.  . tamsulosin (FLOMAX) 0.4 MG CAPS capsule TAKE ONE CAPSULE BY MOUTH DAILY  . tiZANidine (ZANAFLEX) 4 MG tablet Take 1 tablet (4 mg total) by mouth every 8 (eight) hours as needed for muscle spasms.  . vitamin C (ASCORBIC ACID) 500 MG tablet Take 500 mg by mouth daily.  Marland Kitchen zolpidem (AMBIEN CR) 12.5 MG CR tablet TAKE 1 TABLET(12.5 MG) BY MOUTH AT BEDTIME AS NEEDED FOR SLEEP   No facility-administered encounter medications on file as of 09/08/2020.    Current Diagnosis: Patient Active Problem List   Diagnosis Date Noted  . Choking 02/06/2020  . Insomnia 02/06/2020  . Persistent atrial fibrillation ([place]) 11/23/2019  . Secondary hypercoagulable state ([place]) 11/23/2019  .  OSA (obstructive sleep apnea) 08/27/2019  . Arthralgia 04/09/2019  . Throat congestion 02/23/2019  . Marijuana use 12/02/2018  . Elevated troponin 12/02/2018  . HLD (hyperlipidemia) 11/29/2018  . CAD (coronary artery disease) 11/29/2018  . Chest pain 11/29/2018  . Scalp cyst 10/25/2018  . Degenerative disc disease, lumbar 03/09/2018  . Routine general medical examination at a health care facility 09/22/2017  . BPH without urinary obstruction 07/22/2016  . Anemia, iron deficiency 07/11/2015  . Nonallopathic lesion-rib cage 05/29/2015  . Nonallopathic lesion of thoracic region 05/29/2015  . Nonallopathic lesion of cervical region 05/29/2015  . AV  block, 2nd degree 10/29/2013  . Migraine- chronic Verapamil 10/29/2013  . Paroxysmal atrial fibrillation ([place]) 10/26/2013  . Carotid bruit- 50-38% LICA by doppler 8/82 03/14/2012  . ALCOHOL USE 01/23/2009  . Elevated lipids 01/22/2009  . Essential hypertension 01/22/2009  . Bradycardia 01/22/2009  . Aortic aneurysm ([place]) 01/22/2009    Goals Addressed   None     09/08/2020 Name: [name] Brown MRN: [id number] DOB: Sep 15, 1951 [name] Brown is a 69 y.o. year old male who is a primary care patient of Hoyt Koch, MD.  Comprehensive medication review performed; Spoke to patient regarding cholesterol  Lipid Panel    Component Value Date/Time   CHOL 114 11/29/2018 0223   TRIG 32 11/29/2018 0223   HDL 24 (L) 11/29/2018 0223   LDLCALC 84 11/29/2018 0223    Follow-Up:  Pharmacist Review   Called and spoke with patient to ask if he was having any health issues since his last visit with the clinical pharmacist [name] Brown. The patient states that he has been doing well health wise, he mentioned that he is no longer taking Amiodarone and that he cut back on his ramipril. He stated that he is taking all of his medications daily. He also stated that is nothing has changed with his diet or exercise. The patient states that he does not check his blood pressure every day but when he does it is normal.The patient states that he has a sleeping device for his sleep apnea. I told the patient that I would pass along the information to the clinical pharmacist [name] Brown, [place]

## 2020-09-14 ENCOUNTER — Other Ambulatory Visit: Payer: Self-pay | Admitting: Internal Medicine

## 2020-09-23 ENCOUNTER — Encounter: Payer: Self-pay | Admitting: Cardiology

## 2020-09-23 ENCOUNTER — Ambulatory Visit: Payer: PPO | Admitting: Cardiology

## 2020-09-23 ENCOUNTER — Other Ambulatory Visit: Payer: Self-pay

## 2020-09-23 ENCOUNTER — Other Ambulatory Visit: Payer: Self-pay | Admitting: Cardiovascular Disease

## 2020-09-23 VITALS — BP 140/60 | HR 58 | Ht 70.0 in | Wt 218.0 lb

## 2020-09-23 DIAGNOSIS — I4819 Other persistent atrial fibrillation: Secondary | ICD-10-CM | POA: Diagnosis not present

## 2020-09-23 DIAGNOSIS — G720 Drug-induced myopathy: Secondary | ICD-10-CM | POA: Diagnosis not present

## 2020-09-23 DIAGNOSIS — T466X5A Adverse effect of antihyperlipidemic and antiarteriosclerotic drugs, initial encounter: Secondary | ICD-10-CM | POA: Diagnosis not present

## 2020-09-23 NOTE — Progress Notes (Signed)
Electrophysiology Office Note   Date:  09/23/2020   ID:  [name] Brown, DOB 1951/05/10, MRN [id number]  PCP:  Hoyt Koch, MD  Cardiologist:  Johnsie Cancel Primary Electrophysiologist:  Marquan Vokes Meredith Leeds, MD    No chief complaint on file.    History of Present Illness: [name] Brown is a 69 y.o. male who is being seen today for the evaluation of atrial fibrillation at the request of Hoyt Koch, *. Presenting today for electrophysiology evaluation.    He has a history significant for coronary artery disease status post three-vessel CABG, hypertension, hyperlipidemia, and atrial fibrillation.  He was found to be in atrial fibrillation incidentally 08/30/2017.  He was put on Eliquis at the time.  His beta-blockers were not started due to resting bradycardia.  January 2020 he was noted to have dyspnea on exertion.  He is now status post AF ablation 03/19/2020.  Today, denies symptoms of palpitations, chest pain, shortness of breath, orthopnea, PND, lower extremity edema, claudication, dizziness, presyncope, syncope, bleeding, or neurologic sequela. The patient is tolerating medications without difficulties.  He is currently feeling well.  He has no chest pain or shortness of breath.  He is able to do all of his daily activities.  He had a neurostimulator placed for obstructive sleep apnea.  He has noted a significant improvement in his functional status and level of fatigue.  He has not noted any further episodes of atrial fibrillation.  Past Medical History:  Diagnosis Date  . Aortic aneurysm ([place]) Endograf 2009  . Arthritis   . Atrial fibrillation ([place])   . Bradycardia   . CAD (coronary artery disease) CABG 03/2000   Median sternotomy for coronary artery bypass grafting x 3 (left  . HTN (hypertension)   . Hx of CABG 2001   severe 3 vessel disease  . Hx of colonic polyp 06/2010   Hyperplastic   . Hyperlipidemia   . Myocardial infarction ([place])   . Sleep apnea     Past Surgical History:  Procedure Laterality Date  . ATRIAL FIBRILLATION ABLATION N/A 03/19/2020   Procedure: ATRIAL FIBRILLATION ABLATION;  Surgeon: Constance Haw, MD;  Location: [facility] CV LAB;  Service: Cardiovascular;  Laterality: N/A;  . CARDIOVERSION N/A 11/28/2018   Procedure: CARDIOVERSION;  Surgeon: Elouise Munroe, MD;  Location: [facility] ENDOSCOPY;  Service: Cardiovascular;  Laterality: N/A;  . COLONOSCOPY    . CORONARY ARTERY BYPASS GRAFT     AAA stent graft on 12/01/2007 at [facility]  coronary bypass in 2001. Dr. Roxy Manns.  Marland Kitchen DRUG INDUCED ENDOSCOPY Right 02/18/2020   Procedure: DRUG INDUCED ENDOSCOPY;  Surgeon: Melida Quitter, MD;  Location: [facility];  Service: ENT;  Laterality: Right;  . FOOT SURGERY Left 07/19/2017  . IMPLANTATION OF HYPOGLOSSAL NERVE STIMULATOR Right 07/30/2020   Procedure: IMPLANTATION OF HYPOGLOSSAL NERVE STIMULATOR Implantation of Chest wall respirator Sensor Electrode Electronic Analysis of Implanted Neurostimulator Pulse Generator;  Surgeon: Melida Quitter, MD;  Location: [facility];  Service: ENT;  Laterality: Right;  . LEFT HEART CATHETERIZATION WITH CORONARY ANGIOGRAM N/A 10/26/2013   Procedure: LEFT HEART CATHETERIZATION WITH CORONARY ANGIOGRAM;  Surgeon: Peter M Martinique, MD;  Location[facility] C[facility] LAB;  Service: Cardiovascular;  Laterality: N/A;  . MASS EXCISION Right 08/10/2019   Procedure: EXCISION OF CYST FROM RIGHT SCALP;  Surgeon: Jovita Kussmaul, MD;  Location: [facility];  Service: General;  Laterality: Right;     Current Outpatient Medications  Medication Sig Dispense Refill  . apixaban (ELIQUIS) 5 MG TABS tablet Take 1 tablet (5 mg total) by mouth 2 (two) times daily. 180 tablet 2  . aspirin EC 81 MG EC tablet Take 1 tablet (81 mg total) by mouth daily.    . Biotin 10000 MCG TABS Take 10,000 mcg by mouth daily.    . cholecalciferol (VITAMIN D) 25 MCG (1000 UT) tablet Take 1,000  Units by mouth daily.     . diclofenac sodium (VOLTAREN) 1 % GEL Apply 2 g topically 4 (four) times daily. 100 g 3  . divalproex (DEPAKOTE ER) 500 MG 24 hr tablet TAKE 1 TABLET(500 MG) BY MOUTH DAILY 90 tablet 1  . doxycycline (VIBRAMYCIN) 50 MG capsule Take 50 mg by mouth 2 (two) times daily.    Marland Kitchen ezetimibe (ZETIA) 10 MG tablet Take 1 tablet (10 mg total) by mouth daily. 90 tablet 3  . furosemide (LASIX) 20 MG tablet TAKE 1 TABLET(20 MG) BY MOUTH TWICE DAILY 180 tablet 1  . gabapentin (NEURONTIN) 300 MG capsule TAKE 1 CAPSULE(300 MG) BY MOUTH TWICE DAILY 180 capsule 2  . Glucosamine-Chondroitin (GLUCOSAMINE CHONDR COMPLEX PO) Take 500 mg by mouth 2 (two) times daily.     . hydrALAZINE (APRESOLINE) 25 MG tablet Take 1 tablet (25 mg total) by mouth in the morning and at bedtime. 270 tablet 2  . isosorbide mononitrate (IMDUR) 30 MG 24 hr tablet TAKE 1 TABLET BY MOUTH EVERY DAY 90 tablet 2  . levocetirizine (XYZAL) 5 MG tablet Take 1 tablet (5 mg total) by mouth every evening. 90 tablet 3  . Multiple Vitamin (MULTIVITAMIN) capsule Take 1 capsule by mouth daily.    . niacin (NIASPAN) 1000 MG CR tablet Take 1,000 mg by mouth 2 (two) times daily.    . nitroGLYCERIN (NITROSTAT) 0.4 MG SL tablet DISSOLVE 1 TABLET UNDER THE TONGUE EVERY 5 MINUTES FOR 3 DOSES AS NEEDED FOR CHEST PAIN 25 tablet 4  . Omega-3 Fatty Acids (FISH OIL) 1200 MG CAPS Take 1,200 mg by mouth 2 (two) times daily.    . potassium chloride SA (KLOR-CON) 20 MEQ tablet TAKE 1 TABLET BY MOUTH EVERY DAY 90 tablet 3  . ramipril (ALTACE) 10 MG capsule Take 10 mg by mouth daily.    . tamsulosin (FLOMAX) 0.4 MG CAPS capsule TAKE ONE CAPSULE BY MOUTH DAILY 90 capsule 3  . tiZANidine (ZANAFLEX) 4 MG tablet Take 1 tablet (4 mg total) by mouth every 8 (eight) hours as needed for muscle spasms. 270 tablet 3  . vitamin C (ASCORBIC ACID) 500 MG tablet Take 500 mg by mouth daily.    Marland Kitchen zolpidem (AMBIEN CR) 12.5 MG CR tablet TAKE 1 TABLET(12.5 MG) BY  MOUTH AT BEDTIME AS NEEDED FOR SLEEP 30 tablet 5   No current facility-administered medications for this visit.    Allergies:   Other and Statins   Social History:  The patient  reports that he quit smoking about 28 years ago. His smoking use included cigarettes. He has a 60.00 pack-year smoking history. He has never used smokeless tobacco. He reports that he does not drink alcohol and does not use drugs.   Family History:  The patient's family history includes Heart attack in his father and mother; Heart disease in his father and mother.   ROS:  Please see the history of present illness.   Otherwise, review of systems is positive for none.   All other systems are reviewed and negative.   PHYSICAL  EXAM: VS:  BP 140/60   Pulse (!) 58   Ht 5\' 10"  (1.778 m)   Wt 218 lb (98.9 kg)   SpO2 96%   BMI 31.28 kg/m  , BMI Body mass index is 31.28 kg/m. GEN: Well nourished, well developed, in no acute distress  HEENT: normal  Neck: no JVD, carotid bruits, or masses Cardiac: RRR; no murmurs, rubs, or gallops,no edema  Respiratory:  clear to auscultation bilaterally, normal work of breathing GI: soft, nontender, nondistended, + BS MS: no deformity or atrophy  Skin: warm and dry Neuro:  Strength and sensation are intact Psych: euthymic mood, full affect  EKG:  EKG is ordered today. Personal review of the ekg ordered shows sinus rhythm  Recent Labs: 03/19/2020: Hemoglobin 10.8; Platelets 183 04/15/2020: ALT 18; TSH 1.233 07/30/2020: BUN 26; Creatinine, Ser 1.43; Potassium 4.3; Sodium 140    Lipid Panel     Component Value Date/Time   CHOL 114 11/29/2018 0223   TRIG 32 11/29/2018 0223   HDL 24 (L) 11/29/2018 0223   CHOLHDL 4.8 11/29/2018 0223   VLDL 6 11/29/2018 0223   LDLCALC 84 11/29/2018 0223     Wt Readings from Last 3 Encounters:  09/23/20 218 lb (98.9 kg)  08/27/20 214 lb (97.1 kg)  08/01/20 215 lb (97.5 kg)      Other studies Reviewed: Additional studies/ records that  were reviewed today include: TTE 01/21/20 Review of the above records today demonstrates:  1. Left ventricular ejection fraction, by estimation, is 55 to 60%. The  left ventricle has normal function. Left ventricular endocardial border  not optimally defined to evaluate regional wall motion. There is mild left  ventricular hypertrophy. Left  ventricular diastolic parameters are consistent with Grade II diastolic  dysfunction (pseudonormalization).  2. Right ventricular systolic function is normal. The right ventricular  size is mildly enlarged. There is mildly elevated pulmonary artery  systolic pressure.  3. Left atrial size was moderately dilated.  4. Right atrial size was mildly dilated.  5. The mitral valve is normal in structure. Mild mitral valve  regurgitation.  6. The aortic valve is tricuspid. Aortic valve regurgitation is mild.  Mild aortic valve sclerosis is present, with no evidence of aortic valve  stenosis.  7. The inferior vena cava is normal in size with greater than 50%  respiratory variability, suggesting right atrial pressure of 3 mmHg.   SPECT 11/08/18  The left ventricular ejection fraction is normal (55-65%).  Nuclear stress EF: 56%. Post CABG septal wall hypokinesis.  There was no ST segment deviation noted during stress.  There is mildly reduced radiotracer uptake at both rest and stress in the anterior, lateral and inferolateral distributions. No ischemia identified. (POST CABG)  This is a low risk study.  ASSESSMENT AND PLAN:  1.  Persistent atrial fibrillation: Currently on Eliquis with a CHA2DS2-VASc of 3.  He was taken off of amiodarone at his last visit.  He is status post AF ablation 03/19/2020.  He is remained in sinus rhythm.  High risk medication monitoring.  2.  Coronary artery disease: Status post CABG is occluded radial to the OM.  No current chest pain.    3.  Hypertension: Mildly elevated today.  He is usually less than 401 systolic at  home.  No changes.  4.  Hyperlipidemia: Is currently not on a statin due to intolerance.  We Maryanna Stuber have him follow-up in lipid clinic for discussions of further therapy.  5.  Obstructive sleep apnea: Currently  treated with nerve stimulator.  He is feeling well without complaint.  No changes.  Current medicines are reviewed at length with the patient today.   The patient does not have concerns regarding his medicines.  The following changes were made today: None  Labs/ tests ordered today include:  Orders Placed This Encounter  Procedures  . EKG 12-Lead    Disposition:   FU with Jiles Goya 6 months  Signed, Ruthmary Occhipinti Meredith Leeds, MD  09/23/2020 8:43 AM     [facility] [address] [place] [address] [phone] (office) [phone] (fax)

## 2020-09-23 NOTE — Patient Instructions (Signed)
Medication Instructions:  Your physician recommends that you continue on your current medications as directed. Please refer to the Current Medication list given to you today.  *If you need a refill on your cardiac medications before your next appointment, please call your pharmacy*   Lab Work: None ordered   Testing/Procedures: None ordered   Follow-Up: At [facility], you and your health needs are our priority.  As part of our continuing mission to provide you with exceptional heart care, we have created designated Provider Care Teams.  These Care Teams include your primary Cardiologist (physician) and Advanced Practice Providers (APPs -  Physician Assistants and Nurse Practitioners) who all work together to provide you with the care you need, when you need it.  You have been referred to our pharmacist to discuss Lipid management   Your next appointment:   6 month(s)  The format for your next appointment:   In Person  Provider:   Allegra Lai, MD    Thank you for choosing [facility]!!   Trinidad Curet, RN [phone]

## 2020-09-25 DIAGNOSIS — G720 Drug-induced myopathy: Secondary | ICD-10-CM | POA: Insufficient documentation

## 2020-10-09 ENCOUNTER — Ambulatory Visit: Payer: PPO | Admitting: Cardiology

## 2020-10-15 ENCOUNTER — Other Ambulatory Visit: Payer: Self-pay | Admitting: Internal Medicine

## 2020-10-16 ENCOUNTER — Ambulatory Visit: Payer: PPO

## 2020-10-16 NOTE — Telephone Encounter (Signed)
Please advise in PCP's absence. Thanks!   Controlled Database Checked Last filled:  09/15/20   # 30 LOV: 02/06/20  Next appt: None

## 2020-10-24 ENCOUNTER — Telehealth (INDEPENDENT_AMBULATORY_CARE_PROVIDER_SITE_OTHER): Payer: PPO | Admitting: Cardiology

## 2020-10-24 ENCOUNTER — Other Ambulatory Visit: Payer: Self-pay

## 2020-10-24 ENCOUNTER — Encounter: Payer: Self-pay | Admitting: Cardiology

## 2020-10-24 VITALS — BP 143/67 | Ht 70.0 in | Wt 205.0 lb

## 2020-10-24 DIAGNOSIS — I10 Essential (primary) hypertension: Secondary | ICD-10-CM | POA: Diagnosis not present

## 2020-10-24 DIAGNOSIS — G4733 Obstructive sleep apnea (adult) (pediatric): Secondary | ICD-10-CM | POA: Diagnosis not present

## 2020-10-24 NOTE — Progress Notes (Signed)
Virtual Visit via Video Note   This visit type was conducted due to national recommendations for restrictions regarding the COVID-19 Pandemic (e.g. social distancing) in an effort to limit this patient's exposure and mitigate transmission in our community.  Due to his co-morbid illnesses, this patient is at least at moderate risk for complications without adequate follow up.  This format is felt to be most appropriate for this patient at this time.  All issues noted in this document were discussed and addressed.  A limited physical exam was performed with this format.  Please refer to the patient's chart for his consent to telehealth for [facility].  Date:  10/24/2020   ID:  [name] Brown, DOB 29-Apr-1951, MRN [id number] The patient was identified using 2 identifiers.  Patient Location: Home Provider Location: Office/Clinic  Date:  10/24/2020   ID:  [name] Brown, DOB 04-15-1951, MRN [id number]  PCP:  Hoyt Koch, MD  Cardiologist:  Jenkins Rouge, MD  Sleep Medicine:  Leanora Cover, MD Electrophysiologist:  Will Meredith Leeds, MD   Chief Complaint:  OSA  History of Present Illness:    [name] Brown is a 70 y.o. male  With a hx of afib, SSS, CAD.  He has a hx of OSA with home sleep study documenting this in 2018.  He was placed on CPAP but at that time did not tolerate CPAP therapy.  He was using a nasal mask or nasal pillow mask but it bothered him so much he could not use it.  He was referred to Dr. Ron Parker for an oral device but apparently needed implants put in because he does not have enough teeth but it was cost prohibitive.   He decided to retry CPAP and underwent CPAP tiration in 08/2019.  Due to ongoing respiratory events he could not be adequately titrated on CPAP and was changed to BiPAP.  He again could not tolerate BiPAP.   Due to problems with excessive daytime sleepiness he was referred to ENT to be evaluated for left hypoglossal nerve stimulator.    He  underwent Hypoglossal N stimulator placement 07/30/2020 and underwent device activation over 4 weeks ago. He is doing great with his device.  He is sleeping great, has more energy and is no longer snoring at night.  He says that his delay time is only 53min and would like an additional 15 min added at some point.  He uses it every night.  He is now at level 6. He tried level 7 but it bothered him when it came on if he was not yet asleep.  He feels very rested when he gets up in the am and no longer has any problems having to nap during the day.  He denies any problems with tongue discomfort until he got to 7 and then during the day noticed some discomfort so he is back to level 6.   Prior CV studies:   The following studies were reviewed today:  Sleep study  Past Medical History:  Diagnosis Date  . Aortic aneurysm ([place]) Endograf 2009  . Arthritis   . Atrial fibrillation ([place])   . Bradycardia   . CAD (coronary artery disease) CABG 03/2000   Median sternotomy for coronary artery bypass grafting x 3 (left  . HTN (hypertension)   . Hx of CABG 2001   severe 3 vessel disease  . Hx of colonic polyp 06/2010   Hyperplastic   . Hyperlipidemia   . Myocardial infarction ([place])   .  Sleep apnea    Past Surgical History:  Procedure Laterality Date  . ATRIAL FIBRILLATION ABLATION N/A 03/19/2020   Procedure: ATRIAL FIBRILLATION ABLATION;  Surgeon: Constance Haw, MD;  Location: [place] CV LAB;  Service: Cardiovascular;  Laterality: N/A;  . CARDIOVERSION N/A 11/28/2018   Procedure: CARDIOVERSION;  Surgeon: Elouise Munroe, MD;  Location: [facility] ENDOSCOPY;  Service: Cardiovascular;  Laterality: N/A;  . COLONOSCOPY    . CORONARY ARTERY BYPASS GRAFT     AAA stent graft on 12/01/2007 at [facility]  coronary bypass in 2001. Dr. Roxy Manns.  Marland Kitchen DRUG INDUCED ENDOSCOPY Right 02/18/2020   Procedure: DRUG INDUCED ENDOSCOPY;  Surgeon: Melida Quitter, MD;  Location: [place];   Service: ENT;  Laterality: Right;  . FOOT SURGERY Left 07/19/2017  . IMPLANTATION OF HYPOGLOSSAL NERVE STIMULATOR Right 07/30/2020   Procedure: IMPLANTATION OF HYPOGLOSSAL NERVE STIMULATOR Implantation of Chest wall respirator Sensor Electrode Electronic Analysis of Implanted Neurostimulator Pulse Generator;  Surgeon: Melida Quitter, MD;  Location: [place];  Service: ENT;  Laterality: Right;  . LEFT HEART CATHETERIZATION WITH CORONARY ANGIOGRAM N/A 10/26/2013   Procedure: LEFT HEART CATHETERIZATION WITH CORONARY ANGIOGRAM;  Surgeon: Peter M Martinique, MD;  Location: [facility] CATH LAB;  Service: Cardiovascular;  Laterality: N/A;  . MASS EXCISION Right 08/10/2019   Procedure: EXCISION OF CYST FROM RIGHT SCALP;  Surgeon: Autumn Messing III, MD;  Location: [place];  Service: General;  Laterality: Right;     Current Meds  Medication Sig  . apixaban (ELIQUIS) 5 MG TABS tablet Take 1 tablet (5 mg total) by mouth 2 (two) times daily.  Marland Kitchen aspirin EC 81 MG EC tablet Take 1 tablet (81 mg total) by mouth daily.  . Biotin 10000 MCG TABS Take 10,000 mcg by mouth daily.  . cholecalciferol (VITAMIN D) 25 MCG (1000 UT) tablet Take 1,000 Units by mouth daily.   . diclofenac sodium (VOLTAREN) 1 % GEL Apply 2 g topically 4 (four) times daily.  . divalproex (DEPAKOTE ER) 500 MG 24 hr tablet TAKE 1 TABLET(500 MG) BY MOUTH DAILY  . doxycycline (VIBRAMYCIN) 50 MG capsule Take 50 mg by mouth 2 (two) times daily.  Marland Kitchen ezetimibe (ZETIA) 10 MG tablet Take 1 tablet (10 mg total) by mouth daily.  . furosemide (LASIX) 20 MG tablet TAKE 1 TABLET(20 MG) BY MOUTH TWICE DAILY  . gabapentin (NEURONTIN) 300 MG capsule TAKE 1 CAPSULE(300 MG) BY MOUTH TWICE DAILY  . Glucosamine-Chondroitin (GLUCOSAMINE CHONDR COMPLEX PO) Take 500 mg by mouth 2 (two) times daily.   . hydrALAZINE (APRESOLINE) 25 MG tablet Take 1 tablet (25 mg total) by mouth in the morning and at bedtime.  . isosorbide mononitrate (IMDUR) 30 MG 24  hr tablet TAKE 1 TABLET BY MOUTH EVERY DAY  . levocetirizine (XYZAL) 5 MG tablet Take 1 tablet (5 mg total) by mouth every evening.  . Multiple Vitamin (MULTIVITAMIN) capsule Take 1 capsule by mouth daily.  . niacin (NIASPAN) 1000 MG CR tablet Take 1,000 mg by mouth 2 (two) times daily.  . nitroGLYCERIN (NITROSTAT) 0.4 MG SL tablet DISSOLVE 1 TABLET UNDER THE TONGUE EVERY 5 MINUTES FOR 3 DOSES AS NEEDED FOR CHEST PAIN  . Omega-3 Fatty Acids (FISH OIL) 1200 MG CAPS Take 1,200 mg by mouth 2 (two) times daily.  . potassium chloride SA (KLOR-CON) 20 MEQ tablet TAKE 1 TABLET BY MOUTH EVERY DAY  . ramipril (ALTACE) 10 MG capsule Take 10 mg by mouth daily.  Marland Kitchen  tamsulosin (FLOMAX) 0.4 MG CAPS capsule TAKE ONE CAPSULE BY MOUTH DAILY  . tiZANidine (ZANAFLEX) 4 MG tablet Take 1 tablet (4 mg total) by mouth every 8 (eight) hours as needed for muscle spasms.  . vitamin C (ASCORBIC ACID) 500 MG tablet Take 500 mg by mouth daily.  Marland Kitchen zolpidem (AMBIEN CR) 12.5 MG CR tablet TAKE 1 TABLET(12.5 MG) BY MOUTH AT BEDTIME AS NEEDED FOR SLEEP     Allergies:   Other and Statins   Social History   Tobacco Use  . Smoking status: Former Smoker    Packs/day: 2.00    Years: 30.00    Pack years: 60.00    Types: Cigarettes    Quit date: 01/05/1992    Years since quitting: 28.8  . Smokeless tobacco: Never Used  . Tobacco comment: quit 19 yrs ago  Vaping Use  . Vaping Use: Never used  Substance Use Topics  . Alcohol use: No  . Drug use: No     Family Hx: The patient's family history includes Heart attack in his father and mother; Heart disease in his father and mother. There is no history of Rectal cancer, Stomach cancer, Pancreatic cancer, Esophageal cancer, Colon polyps, or Colon cancer.  ROS:   Please see the history of present illness.     All other systems reviewed and are negative.   Labs/Other Tests and Data Reviewed:    Recent Labs: 03/19/2020: Hemoglobin 10.8; Platelets 183 04/15/2020: ALT 18; TSH  1.233 07/30/2020: BUN 26; Creatinine, Ser 1.43; Potassium 4.3; Sodium 140   Recent Lipid Panel Lab Results  Component Value Date/Time   CHOL 114 11/29/2018 02:23 AM   TRIG 32 11/29/2018 02:23 AM   HDL 24 (L) 11/29/2018 02:23 AM   CHOLHDL 4.8 11/29/2018 02:23 AM   LDLCALC 84 11/29/2018 02:23 AM    Wt Readings from Last 3 Encounters:  10/24/20 205 lb (93 kg)  09/23/20 218 lb (98.9 kg)  08/27/20 214 lb (97.1 kg)     Objective:    Vital Signs:  Ht 5\' 10"  (1.778 m)   Wt 205 lb (93 kg)   BMI 29.41 kg/m    Well nourished, well developed male in no acute distress. Well appearing, alert and conversant, regular work of breathing,  good skin color  Eyes- anicteric mouth- oral mucosa is pink  neuro- grossly intact skin- no apparent rash or lesions or cyanosis   ASSESSMENT & PLAN:    1.  OSA -he has had a sleep study in 2018 and was tried on CPAP but he did not tolerate the PAP both in 2018 and on retry in 2021. -the oral device was cost prohibitive due to needing major dental work prior to getting the device so that is not an option -now s/p hypoglossal N stimulator -his device was turned on at last OV and education provided on use of the device -parameters set as follows: -Threshold testing:  Sensation 2.1V and Functional 2.3V with tongue protrusion -Final settings prescribed:  Amplitude 2.3V  Patient control 2.3-3.3V  Pulse width 53mcs  Rate 33Hz   Start delay 30 min  Pause time 15 min  Therapy duration 8 hours  Electrodes +/-/+ -he is doing well with his device and has noticed significant improvement in his sx of daytime sleepiness -he denies any problems with tongue discomfort until he got to 7 and then during the day noticed some discomfort so he is back to level 6. -we will go ahead and get in lab sleep study  set up with Inspire Rep  2.  HTN -BP is borderline controlled on exam today -continue Hydralazine 25mg  BID, Ramipril 10mg  daily and Imdur 30mg  daily. -SCR was  0.880 and K+ 4.1 in Oct 2021   Medication Adjustments/Labs and Tests Ordered: Current medicines are reviewed at length with the patient today.  Concerns regarding medicines are outlined above.  Tests Ordered: No orders of the defined types were placed in this encounter.  Medication Changes: No orders of the defined types were placed in this encounter.   Disposition: Set up for in lab sleep study with Inspire titration  Signed, Fransico Him, MD  10/24/2020 10:08 AM    [place]

## 2020-10-27 ENCOUNTER — Other Ambulatory Visit: Payer: Self-pay | Admitting: Internal Medicine

## 2020-10-27 DIAGNOSIS — G44009 Cluster headache syndrome, unspecified, not intractable: Secondary | ICD-10-CM

## 2020-10-29 NOTE — Progress Notes (Signed)
Patient ID: DALESSANDRO BALDYGA                 DOB: 1951/08/03                    MRN: [id number]     HPI: [name] Brown is a 70 y.o. male patient referred to lipid clinic by Dr. Curt Bears. PMH is significant for CAD s/p three-vessel CABG in 2001, s/p AAA stent coronary graft in 2009, s/p left coronary cath 2015, HTN, HLD, Afib s/p ablation 03/19/2020, OSA, and bradycardia. He was last seen in clinic on 09/23/2020 for electrophysiology follow-up and no medication changes were made.   Pt presents today in good spirits. He brought in his current medication list. Pt has a history of statin intolerance. States that about 20 years ago he took Lipitor for years and experienced severe myalgias on it. He was seeing PT with no improvement. PCP at the time checked CK and they were severely elevated. He was then switched to Crestor and had similar problems with myalgias. He vaguely remembers trying red yeast rice but is unsure if he took this. He does not wish to be on a statin. Pt has family history of premature cardiac disease, noted below. He has not had a lipid panel in 2 years.   Current Medications:  Ezetimibe 10 mg daily Niacin 1000 mg BID  Intolerances:  Crestor - myalgias Lipitor - myalgias with elevated CPK Red yeast rice  Risk Factors: early disease, family history, former smoker  LDL goal: < 55 mg/dL  Diet: Reports eating a lot of chicken, his wife would like if he ate more fish. Reports eating red meat 2-3x week. Reports eating a lot of vegetables and not a lot of carbs and starches. Sometimes eats potatoes. Mentions trying to fit diet to his cardiovascular disease  Exercise: Has lowe back pain and activity is limited. He does not do as much as he would like. When he is active, he works around the yard for an hour or two. Tries to stay as active as he can.   Family History: Patient's family history includes heart disease in his father and mother. Father died of 29th or 6th heart attack at age 49  in 52. Mother died of heart attack in 37.   Social History: Former smoker (30 pack-years, quit date 01/05/1992)  Labs: 11/29/2018: TC 114, TG 32, HDL 24, LDL 84 (on Zetia, niacin)  Past Medical History:  Diagnosis Date  . Aortic aneurysm ([place]) Endograf 2009  . Arthritis   . Atrial fibrillation ([place])   . Bradycardia   . CAD (coronary artery disease) CABG 03/2000   Median sternotomy for coronary artery bypass grafting x 3 (left  . HTN (hypertension)   . Hx of CABG 2001   severe 3 vessel disease  . Hx of colonic polyp 06/2010   Hyperplastic   . Hyperlipidemia   . Myocardial infarction ([place])   . Sleep apnea     Current Outpatient Medications on File Prior to Visit  Medication Sig Dispense Refill  . apixaban (ELIQUIS) 5 MG TABS tablet Take 1 tablet (5 mg total) by mouth 2 (two) times daily. 180 tablet 2  . aspirin EC 81 MG EC tablet Take 1 tablet (81 mg total) by mouth daily.    . Biotin 10000 MCG TABS Take 10,000 mcg by mouth daily.    . cholecalciferol (VITAMIN D) 25 MCG (1000 UT) tablet Take 1,000 Units by mouth daily.     Marland Kitchen  diclofenac sodium (VOLTAREN) 1 % GEL Apply 2 g topically 4 (four) times daily. 100 g 3  . divalproex (DEPAKOTE ER) 500 MG 24 hr tablet TAKE 1 TABLET(500 MG) BY MOUTH DAILY 90 tablet 0  . doxycycline (VIBRAMYCIN) 50 MG capsule Take 50 mg by mouth 2 (two) times daily.    Marland Kitchen ezetimibe (ZETIA) 10 MG tablet Take 1 tablet (10 mg total) by mouth daily. 90 tablet 3  . furosemide (LASIX) 20 MG tablet TAKE 1 TABLET(20 MG) BY MOUTH TWICE DAILY 180 tablet 1  . gabapentin (NEURONTIN) 300 MG capsule TAKE 1 CAPSULE(300 MG) BY MOUTH TWICE DAILY 180 capsule 2  . Glucosamine-Chondroitin (GLUCOSAMINE CHONDR COMPLEX PO) Take 500 mg by mouth 2 (two) times daily.     . hydrALAZINE (APRESOLINE) 25 MG tablet Take 1 tablet (25 mg total) by mouth in the morning and at bedtime. 270 tablet 2  . isosorbide mononitrate (IMDUR) 30 MG 24 hr tablet TAKE 1 TABLET BY MOUTH EVERY DAY 90 tablet  2  . levocetirizine (XYZAL) 5 MG tablet Take 1 tablet (5 mg total) by mouth every evening. 90 tablet 3  . Multiple Vitamin (MULTIVITAMIN) capsule Take 1 capsule by mouth daily.    . niacin (NIASPAN) 1000 MG CR tablet Take 1,000 mg by mouth 2 (two) times daily.    . nitroGLYCERIN (NITROSTAT) 0.4 MG SL tablet DISSOLVE 1 TABLET UNDER THE TONGUE EVERY 5 MINUTES FOR 3 DOSES AS NEEDED FOR CHEST PAIN 25 tablet 4  . Omega-3 Fatty Acids (FISH OIL) 1200 MG CAPS Take 1,200 mg by mouth 2 (two) times daily.    . potassium chloride SA (KLOR-CON) 20 MEQ tablet TAKE 1 TABLET BY MOUTH EVERY DAY 90 tablet 2  . ramipril (ALTACE) 10 MG capsule Take 10 mg by mouth daily.    . tamsulosin (FLOMAX) 0.4 MG CAPS capsule TAKE ONE CAPSULE BY MOUTH DAILY 90 capsule 3  . tiZANidine (ZANAFLEX) 4 MG tablet Take 1 tablet (4 mg total) by mouth every 8 (eight) hours as needed for muscle spasms. 270 tablet 3  . vitamin C (ASCORBIC ACID) 500 MG tablet Take 500 mg by mouth daily.    Marland Kitchen zolpidem (AMBIEN CR) 12.5 MG CR tablet TAKE 1 TABLET(12.5 MG) BY MOUTH AT BEDTIME AS NEEDED FOR SLEEP 30 tablet 0   No current facility-administered medications on file prior to visit.    Allergies  Allergen Reactions  . Other Other (See Comments)    Myalgias with elevated CPKs  . Statins     Myalgias with elevated CPKs    Assessment/Plan:  1. Hyperlipidemia - Most recent LDL 84 elevated and above goal < 55 mg/dL. Current TG 32 mg/dL and at goal < 150 mg/dL. However, most recent lipid panel from 2 years ago. Will recheck fasting lipid panel next week as pt is not fasting today. Aggressive goal chosen due to premature disease and family history of premature disease. Will continue ezetimibe 10 mg daily and niacin 1000 mg BID for now. Discussed with patient potential options for lipid lowering therapies. Pt with intolerance to multiple statins in the setting of elevated CPK. Will not rechallenge patient with statins per his preference. Patient would  like to wait until lipid panel results to consider therapy initiation. Once lipid panel results, options are Nexlizet or Repatha (prior authorization already approved through 01/28/21). If only ~20% LDL lowering needed, will initiate Nexlizet daily. If more than 20% LDL lowering needed, will initiate Repatha every two weeks.  If TG at  goal, will discuss discontinuing niacin. Will f/u with patient via phone call next week to discuss lipid panel and therapy changes. With therapy addition, will need to recheck lipid panel in 3 months. Encouraged patient to limit red meat in his diet as well.  Juluis Pitch, PharmD Candidate   Megan E. Supple, PharmD, BCACP, [place] [address], [place], [place] [address] Phone: [phone]; Fax: [phone] 10/30/2020 10:09 AM

## 2020-10-30 ENCOUNTER — Other Ambulatory Visit: Payer: Self-pay

## 2020-10-30 ENCOUNTER — Ambulatory Visit (INDEPENDENT_AMBULATORY_CARE_PROVIDER_SITE_OTHER): Payer: PPO | Admitting: Pharmacist

## 2020-10-30 DIAGNOSIS — E785 Hyperlipidemia, unspecified: Secondary | ICD-10-CM

## 2020-10-30 DIAGNOSIS — T466X5A Adverse effect of antihyperlipidemic and antiarteriosclerotic drugs, initial encounter: Secondary | ICD-10-CM | POA: Diagnosis not present

## 2020-10-30 DIAGNOSIS — G720 Drug-induced myopathy: Secondary | ICD-10-CM | POA: Diagnosis not present

## 2020-10-30 NOTE — Patient Instructions (Addendum)
It was great to meet you today!  Your LDL goal is less than 55. We will get an updated cholesterol panel next week and see where you are at.  If your LDL is not at your goal, we talked about starting one of two medications:  - Repatha: every two week injection, lowers LDL by 60% - Nexlizet: daily pill, would lower LDL by additional 20%  In the meantime, please continue to take your ezetimibe and niacin. We will call you next week to discuss your test results and medication options.   We will check your fasting labs on Tuesday. Come in at any time after 7:30 am.  Please give [place] a call if you have any questions [phone]

## 2020-11-04 ENCOUNTER — Telehealth: Payer: Self-pay

## 2020-11-04 ENCOUNTER — Other Ambulatory Visit: Payer: Self-pay

## 2020-11-04 ENCOUNTER — Other Ambulatory Visit: Payer: PPO | Admitting: *Deleted

## 2020-11-04 DIAGNOSIS — G720 Drug-induced myopathy: Secondary | ICD-10-CM

## 2020-11-04 DIAGNOSIS — T466X5A Adverse effect of antihyperlipidemic and antiarteriosclerotic drugs, initial encounter: Secondary | ICD-10-CM | POA: Diagnosis not present

## 2020-11-04 DIAGNOSIS — E785 Hyperlipidemia, unspecified: Secondary | ICD-10-CM

## 2020-11-04 LAB — LIPID PANEL
Chol/HDL Ratio: 6.1 ratio — ABNORMAL HIGH (ref 0.0–5.0)
Cholesterol, Total: 194 mg/dL (ref 100–199)
HDL: 32 mg/dL — ABNORMAL LOW (ref >39)
LDL Chol Calc (NIH): 141 mg/dL — ABNORMAL HIGH (ref 0–99)
Triglycerides: 114 mg/dL (ref 0–149)
VLDL Cholesterol Cal: 21 mg/dL (ref 5–40)

## 2020-11-04 NOTE — Telephone Encounter (Signed)
Called pt and left message to discuss lab results and therapy changes. LDL 141 and above goal < 55 mg/dL. Pt will likely benefit from initiating Repatha 140 mg [place] once every 2 weeks. Continue ezetimibe 10 mg daily. TG 114 and at goal < 150 mg/dL. Can discontinue niacin. Will need f/u lipid in 3 months.   Repatha currently Tier 4 on [employer] plan but will be dropping to Tier 3 in February. If cost is an issue, can wait to send rx for Repatha until February 1st (next week). Can also ask patient to enroll in [employer].   Will try to contact patient again tomorrow to discuss.  Juluis Pitch, PharmD Candidate.

## 2020-11-05 NOTE — Telephone Encounter (Signed)
Pt returned call to clinic. Discussed lipid panel results with him. He is in agreement with stopping niacin and starting Repatha. He is ok with $90 copay for 3 month supply which will take effect February 1 next week as Tier for Repatha drops from 4 to 3. Will send in [place] rx next Tuesday and confirm with pharmacy that Tier reduction has taken effect and translated to lower copay for pt.

## 2020-11-10 ENCOUNTER — Telehealth: Payer: Self-pay | Admitting: *Deleted

## 2020-11-10 NOTE — Telephone Encounter (Signed)
PA request for CPAP titration submitted to [employer] via fax. Portal would not take provider information.

## 2020-11-11 MED ORDER — REPATHA SURECLICK 140 MG/ML ~~LOC~~ SOAJ: 1.0000 "pen " | SUBCUTANEOUS | 3 refills | Status: DC

## 2020-11-11 NOTE — Telephone Encounter (Signed)
Called pharmacy, confirmed 3 month supply of Repatha is $90. Called pt who is aware. Scheduled lab f/u the same day that he sees Dr Curt Bears.

## 2020-11-11 NOTE — Addendum Note (Signed)
Addended by: Chaquita Basques E on: 11/11/2020 09:07 AM   Modules accepted: Orders

## 2020-11-14 NOTE — Telephone Encounter (Signed)
Patient is doing well with Dawna Part and needs to be set up for in lab titration of his Inspire device - please set up with Inspire rep

## 2020-11-16 ENCOUNTER — Other Ambulatory Visit: Payer: Self-pay | Admitting: Internal Medicine

## 2020-11-16 NOTE — Telephone Encounter (Signed)
Ok forward to pcp 

## 2020-11-19 ENCOUNTER — Telehealth: Payer: Self-pay | Admitting: *Deleted

## 2020-11-19 NOTE — Telephone Encounter (Signed)
Staff message sent to Gae Bon ok to schedule titration study for inspire. [employer] Auth # [id number]. Valid dates 11/17/20 to 02/15/21.

## 2020-11-21 ENCOUNTER — Telehealth (HOSPITAL_COMMUNITY): Payer: Self-pay

## 2020-11-21 ENCOUNTER — Telehealth: Payer: Self-pay | Admitting: Cardiovascular Disease

## 2020-11-21 ENCOUNTER — Telehealth: Payer: Self-pay | Admitting: *Deleted

## 2020-11-21 NOTE — Telephone Encounter (Signed)
Patient called in and states he has been feeling light headed and dizzy. His heart rate is 70 which he normally runs in the 50s range. B/p readings 102/68, 92/65 and this am 136/85. He recently started back on his Ramipril 10mg  and he is taking this medication twice daily. Advised patient to contact Dr. Johnsie Cancel regarding his blood pressure being low. Consulted with patient and he verbalized understanding.

## 2020-11-21 NOTE — Telephone Encounter (Signed)
Called patient back about his message. Patient is having issues with his BP being elevated in the mornings and then dropping in the afternoon. Patient's ramipril was stopped last year, but patient stated back on it and trying to adjust per his BP readings. Informed patient that we have a HTN clinic that he could see that would help him with his BP and HR control. Patient agreed to see them since he is already seeing Pharm D for HLD. Made patient appointment for 11/25/20.

## 2020-11-21 NOTE — Addendum Note (Signed)
Addended by: [name] on: 11/21/2020 03:19 PM   Modules accepted: Orders

## 2020-11-21 NOTE — Telephone Encounter (Signed)
In-lab sleep study has been ordered that needs to be done in 90 days. Patient will need an in office follow up with Dr. Radford Pax and Dawna Part rep 2 weeks after sleep study.       09/10/20 email sent to [employer] [facility] because their website cannot find Dr. Radford Pax as a provider. Waiting on their response. - LCN

## 2020-11-21 NOTE — Telephone Encounter (Signed)
-----   Message from Lauralee Evener, [place] sent at 11/19/2020  3:05 PM EST ----- Regarding: RE: precert Ok to schedule for titration study.[employer] auth received. Auth # [id number]. Valid dates 11/17/20 to 02/15/21. ----- Message ----- From: [name], CMA Sent: 11/05/2020   1:37 PM EST To: [facility]v [facility] Sleep Studies Subject: precert                                        Cpap titration ----- Message ----- From: Sueanne Margarita, MD Sent: 10/24/2020  10:09 AM EST To: [name], CMA  Patient is doing well with Dawna Part and needs to be set up for in lab titration of his Inspire device - please set up with Inspire rep

## 2020-11-21 NOTE — Telephone Encounter (Signed)
Pt c/o medication issue:  1. Name of Medication: Ramipril  2. How are you currently taking this medication (dosage and times per day)? 2 times a day  3. Are you having a reaction (difficulty breathing--STAT)?  4. What is your medication issue? Blood pressure is up and dow, lightheaded and hear rate is high- he says it is 70, which is high for him

## 2020-11-22 NOTE — Telephone Encounter (Addendum)
Patient is scheduled for CPAP Titration on 3/30322.  Patient understands his titration study will be done at [facility] sleep lab. Patient understands he will receive a letter in a week or so detailing appointment, date, time, and location. Patient understands to call if he does not receive the letter  in a timely manner. Patient agrees with treatment and thanked me for call. Patient reminded to bring his remote Rep has been notified.

## 2020-11-24 NOTE — Progress Notes (Signed)
Patient ID: [name] Brown                 DOB: [birth year]                      MRN: [id number]     HPI: [name] Brown is a 70 y.o. male referred by Dr. Johnsie Brown to HTN clinic. PMH is significant for CAD s/p three-vessel CABG in 2001, s/p AAA stent coronary graft in 2009, s/p left coronary cath 2015, HTN, HLD, Afib s/p ablation 03/19/2020, OSA, and bradycardia.   On 11/21/20, pt reported lightheadedness and dizziness. Reported BP elevated in the mornings and drops in the afternoon. BP readings 102/68, 92/65 and 136/85. HR running higher than normal, 70s, (usually 50s range). Patient's ramipril was stopped last year due to hypotension, but eventually restarted on 10 mg daily due to elevated BP. Then, patient self-adjusted ramipril to 10 mg twice daily.   Patient presents today in good spirits. Reports medication adherence with hydralazine 25 mg BID (7AM & 7PM). Reports high BP in December 2021 and restarted ramipril 10 mg daily. Then, self-increased ramipril to twice daily because BP remain elevated. AM BP average 135-150/85-95; lunch/evening BP 100-120s/60-70s. Reports dizziness and blurred vision secondary to high BP in the mornings. Denies headaches and LE swelling. Additionally, reports shortness of breath on exertion in relation to afib - reports HR 70s is high for him. Reports discussing HR with afib clinic which informed patient HR 60-70s is normal.  Additionally, pt reports completing first dose of PCSK9-inhibitor and tolerating well with no side effects.  Current HTN meds: Ramipril 10 mg daily (taking twice daily), hydralazine 25 mg BID, Imdur 30 mg daily (AM), Lasix 20 mg BID  Previously tried: HCTZ 12.5 mg daily, metoprolol succinate 25mg  daily (bradycardia), verapamil (hx of AV block), methyldopa (chronic nausea and diarrhea)  BP goal: <130/80 mmHg  Family History: heart disease in his father and mother. Father died of 12th or 6th heart attack at age 54 in 25. Mother died of heart attack in  71.   Social History: Former smoker (30 pack-years, quit date 01/05/1992)  Diet: Reports eating a lot of chicken, his wife would like if he ate more fish. Reports eating red meat 2-3x week. Reports eating a lot of vegetables and not a lot of carbs and starches. Sometimes eats potatoes. Mentions trying to fit diet to his cardiovascular disease  Exercise: Has lowe back pain and activity is limited. He does not do as much as he would like. When he is active, he works around the yard for an hour or two. Tries to stay as active as he can.   Home BP readings:   AM BP 150/93, 149/92, 134/86  Lunch BP 92/65, 102/68  Evening BP 126/77, 121/79  HR 70, 69, 70, 65, 59  Wt Readings from Last 3 Encounters:  10/24/20 205 lb (93 kg)  09/23/20 218 lb (98.9 kg)  08/27/20 214 lb (97.1 kg)   BP Readings from Last 3 Encounters:  11/25/20 122/74  10/24/20 (!) 143/67  09/23/20 140/60   Pulse Readings from Last 3 Encounters:  11/25/20 64  09/23/20 (!) 58  08/27/20 (!) 51    Renal function: CrCl cannot be calculated (Patient's most recent lab result is older than the maximum 21 days allowed.).  Past Medical History:  Diagnosis Date  . Aortic aneurysm ([place]) Endograf 2009  . Arthritis   . Atrial fibrillation ([place])   . Bradycardia   .  CAD (coronary artery disease) CABG 03/2000   Median sternotomy for coronary artery bypass grafting x 3 (left  . HTN (hypertension)   . Hx of CABG 2001   severe 3 vessel disease  . Hx of colonic polyp 06/2010   Hyperplastic   . Hyperlipidemia   . Myocardial infarction ([place])   . Sleep apnea     Current Outpatient Medications on File Prior to Visit  Medication Sig Dispense Refill  . apixaban (ELIQUIS) 5 MG TABS tablet Take 1 tablet (5 mg total) by mouth 2 (two) times daily. 180 tablet 2  . aspirin EC 81 MG EC tablet Take 1 tablet (81 mg total) by mouth daily.    . Biotin 10000 MCG TABS Take 10,000 mcg by mouth daily.    . cholecalciferol (VITAMIN D) 25 MCG  (1000 UT) tablet Take 1,000 Units by mouth daily.     . diclofenac sodium (VOLTAREN) 1 % GEL Apply 2 g topically 4 (four) times daily. 100 g 3  . divalproex (DEPAKOTE ER) 500 MG 24 hr tablet TAKE 1 TABLET(500 MG) BY MOUTH DAILY 90 tablet 0  . doxycycline (VIBRAMYCIN) 50 MG capsule Take 50 mg by mouth 2 (two) times daily.    . Evolocumab (REPATHA SURECLICK) 342 MG/ML SOAJ Inject 1 pen into the skin every 14 (fourteen) days. 6 mL 3  . ezetimibe (ZETIA) 10 MG tablet Take 1 tablet (10 mg total) by mouth daily. 90 tablet 3  . furosemide (LASIX) 20 MG tablet TAKE 1 TABLET(20 MG) BY MOUTH TWICE DAILY 180 tablet 1  . gabapentin (NEURONTIN) 300 MG capsule TAKE 1 CAPSULE(300 MG) BY MOUTH TWICE DAILY 180 capsule 2  . Glucosamine-Chondroitin (GLUCOSAMINE CHONDR COMPLEX PO) Take 500 mg by mouth 2 (two) times daily.     . hydrALAZINE (APRESOLINE) 25 MG tablet Take 1 tablet (25 mg total) by mouth in the morning and at bedtime. 270 tablet 2  . isosorbide mononitrate (IMDUR) 30 MG 24 hr tablet TAKE 1 TABLET BY MOUTH EVERY DAY 90 tablet 2  . levocetirizine (XYZAL) 5 MG tablet Take 1 tablet (5 mg total) by mouth every evening. 90 tablet 3  . Multiple Vitamin (MULTIVITAMIN) capsule Take 1 capsule by mouth daily.    . nitroGLYCERIN (NITROSTAT) 0.4 MG SL tablet DISSOLVE 1 TABLET UNDER THE TONGUE EVERY 5 MINUTES FOR 3 DOSES AS NEEDED FOR CHEST PAIN 25 tablet 4  . Omega-3 Fatty Acids (FISH OIL) 1200 MG CAPS Take 1,200 mg by mouth 2 (two) times daily.    . potassium chloride SA (KLOR-CON) 20 MEQ tablet TAKE 1 TABLET BY MOUTH EVERY DAY 90 tablet 2  . ramipril (ALTACE) 10 MG capsule Take 10 mg by mouth daily.    . tamsulosin (FLOMAX) 0.4 MG CAPS capsule TAKE ONE CAPSULE BY MOUTH DAILY 90 capsule 3  . tiZANidine (ZANAFLEX) 4 MG tablet Take 1 tablet (4 mg total) by mouth every 8 (eight) hours as needed for muscle spasms. 270 tablet 3  . vitamin C (ASCORBIC ACID) 500 MG tablet Take 500 mg by mouth daily.    Marland Kitchen zolpidem (AMBIEN  CR) 12.5 MG CR tablet TAKE 1 TABLET(12.5 MG) BY MOUTH AT BEDTIME AS NEEDED FOR SLEEP 90 tablet 0   No current facility-administered medications on file prior to visit.    Allergies  Allergen Reactions  . Other Other (See Comments)    Myalgias with elevated CPKs  . Statins     Myalgias with elevated CPKs     Assessment/Plan:  1. Hypertension - BP at goal <130/80 mmHg. Medication adherence appears optimal. Home AM BP readings elevated, however approach BP goal near lunch and dinner times. Will transition ramipril from 20 mg twice daily to once daily in the evenings to provide better AM BP coverage. Patient verbalized understanding. Continued hydralazine 25 mg BID, Imdur 30 mg daily, and Lasix 20 mg BID. If additional BP management is needed, can consider increasing frequency of hydralazine as duration of action is ~8 hours and may benefit from taking it three times daily as to twice daily. Encouraged patient to continue checking BP twice daily at least ~1 hour after taking medications. Will check BMET today since restarting ramipril. Follow-up call scheduled in 2 weeks to assess BP readings.  Lorel Monaco, PharmD, [place] [address], [place], [place] [address] Phone: [phone]; Fax: [phone]

## 2020-11-25 ENCOUNTER — Ambulatory Visit (INDEPENDENT_AMBULATORY_CARE_PROVIDER_SITE_OTHER): Payer: PPO | Admitting: Pharmacist

## 2020-11-25 ENCOUNTER — Other Ambulatory Visit: Payer: Self-pay

## 2020-11-25 ENCOUNTER — Encounter: Payer: Self-pay | Admitting: Pharmacist

## 2020-11-25 VITALS — BP 122/74 | HR 64

## 2020-11-25 DIAGNOSIS — I10 Essential (primary) hypertension: Secondary | ICD-10-CM

## 2020-11-25 MED ORDER — RAMIPRIL 10 MG PO CAPS: 20.0000 mg | ORAL_CAPSULE | Freq: Every day | ORAL | 3 refills | Status: DC

## 2020-11-25 NOTE — Patient Instructions (Addendum)
It was nice to see you today!  Your goal blood pressure is <130/80 mmHg. In clinic, your blood pressure was 122/74 mmHg.  Medication Changes: Begin taking Ramipril 20 mg once daily in the evenings  Continue hydralazine 25 mg twice daily  Continue Imdur 30 mg daily  Continue Lasix 20 mg twice daily  Monitor blood pressure at home daily and keep a log (on your phone or piece of paper) to bring with you to your next visit. Write down date, time, blood pressure and pulse.  Keep up the good work with diet and exercise. Aim for a diet full of vegetables, fruit and lean meats (chicken, [place], fish). Try to limit salt intake by eating fresh or frozen vegetables (instead of canned), rinse canned vegetables prior to cooking and do not add any additional salt to meals.   Please give [place] a call at [phone] with any questions or concerns.

## 2020-11-26 LAB — BASIC METABOLIC PANEL
BUN/Creatinine Ratio: 20 (ref 10–24)
BUN: 24 mg/dL (ref 8–27)
CO2: 20 mmol/L (ref 20–29)
Calcium: 9.3 mg/dL (ref 8.6–10.2)
Chloride: 103 mmol/L (ref 96–106)
Creatinine, Ser: 1.22 mg/dL (ref 0.76–1.27)
GFR calc Af Amer: 70 mL/min/{1.73_m2} (ref >59)
GFR calc non Af Amer: 61 mL/min/{1.73_m2} (ref >59)
Glucose: 99 mg/dL (ref 65–99)
Potassium: 4.7 mmol/L (ref 3.5–5.2)
Sodium: 142 mmol/L (ref 134–144)

## 2020-12-02 ENCOUNTER — Other Ambulatory Visit: Payer: Self-pay | Admitting: *Deleted

## 2020-12-02 MED ORDER — EZETIMIBE 10 MG PO TABS: 10.0000 mg | ORAL_TABLET | Freq: Every day | ORAL | 3 refills | Status: DC

## 2020-12-03 ENCOUNTER — Ambulatory Visit (INDEPENDENT_AMBULATORY_CARE_PROVIDER_SITE_OTHER): Payer: PPO | Admitting: Pharmacist

## 2020-12-03 ENCOUNTER — Other Ambulatory Visit: Payer: Self-pay

## 2020-12-03 DIAGNOSIS — I10 Essential (primary) hypertension: Secondary | ICD-10-CM

## 2020-12-03 DIAGNOSIS — I4819 Other persistent atrial fibrillation: Secondary | ICD-10-CM

## 2020-12-03 DIAGNOSIS — I25729 Atherosclerosis of autologous artery coronary artery bypass graft(s) with unspecified angina pectoris: Secondary | ICD-10-CM

## 2020-12-03 DIAGNOSIS — E785 Hyperlipidemia, unspecified: Secondary | ICD-10-CM

## 2020-12-03 NOTE — Progress Notes (Signed)
Chronic Care Management Pharmacy Note  12/03/2020 Name:  [name] Brown MRN:  [id number] DOB:  1951-01-26  Subjective: [name] Brown is an 70 y.o. year old male who is a primary patient of Hoyt Koch, MD.  The CCM team was consulted for assistance with disease management and care coordination needs.    Engaged with patient by telephone for follow up visit in response to provider referral for pharmacy case management and/or care coordination services.   Consent to Services:  The patient was given the following information about Chronic Care Management services today, agreed to services, and gave verbal consent: 1. CCM service includes personalized support from designated clinical staff supervised by the primary care provider, including individualized plan of care and coordination with other care providers 2. 24/7 contact phone numbers for assistance for urgent and routine care needs. 3. Service will only be billed when office clinical staff spend 20 minutes or more in a month to coordinate care. 4. Only one practitioner may furnish and bill the service in a calendar month. 5.The patient may stop CCM services at any time (effective at the end of the month) by phone call to the office staff. 6. The patient will be responsible for cost sharing (co-pay) of up to 20% of the service fee (after annual deductible is met). Patient agreed to services and consent obtained.  Patient Care Team: Hoyt Koch, MD as PCP - General (Internal Medicine) Josue Hector, MD as PCP - Cardiology (Cardiology) Constance Haw, MD as PCP - Electrophysiology (Cardiology) Sueanne Margarita, MD as PCP - Sleep Medicine (Cardiology) Lujean Amel, MD as Attending Physician (Family Medicine) Charlton Haws, [facility] as Pharmacist (Pharmacist)  Recent office visits: 02/06/20 Dr Sharlet Salina OV: f/u for choking, sleep issues, sleep apnea. Rx zolpidem ER to replace trazodone. Rx Xyzal for choking (related  to post nasal drip)  Recent consult visits: 11/25/20 Karlene Einstein Nwogu (HTN clinic): changed ramipril from BID to 20 mg in evenings. 10/30/20 Megan Supple (lipid clinic): LDL goal < 55 due to premature disease and family hx premature disease. LDL was 141, started Repatha 140 mg. 09/23/20 Dr Curt Bears (cardiology): no med changes. F/u lipid clinic due to statin intolerance. 08/27/20 Dr Radford Pax (cardiology): active Inspire device 08/01/20 Dr Johnsie Cancel (cardiology): BP still low, d/c ramipril 07/30/20 admission for [facility] device implantation. 06/19/20 Dr Curt Bears (cardiology): dc'd amiodarone. SBP has dropped at home 80s-90s. Decrease hydralazine to 25 mg BID. 03/19/20 admission for Afib ablation. 02/04/20 Dr Curt Bears (cardiology): f/u for Afib. S/p cardioversion 11/2016. May benefit from ablation.   Hospital visits: None in previous 6 months  Objective:  Lab Results  Component Value Date   CREATININE 1.22 11/25/2020   BUN 24 11/25/2020   GFR 52.23 (L) 03/16/2019   GFRNONAA 61 11/25/2020   GFRAA 70 11/25/2020   NA 142 11/25/2020   K 4.7 11/25/2020   CALCIUM 9.3 11/25/2020   CO2 20 11/25/2020    Lab Results  Component Value Date/Time   HGBA1C 5.8 (H) 11/29/2018 02:23 AM   GFR 52.23 (L) 03/16/2019 10:41 AM   GFR 70.26 10/24/2018 04:19 PM    Last diabetic Eye exam: No results found for: HMDIABEYEEXA  Last diabetic Foot exam: No results found for: HMDIABFOOTEX   Lab Results  Component Value Date   CHOL 194 11/04/2020   HDL 32 (L) 11/04/2020   LDLCALC 141 (H) 11/04/2020   TRIG 114 11/04/2020   CHOLHDL 6.1 (H) 11/04/2020    Hepatic Function Latest Ref  Rng & Units 04/15/2020 03/16/2019 11/28/2018  Total Protein 6.5 - 8.1 g/dL 6.4(L) 6.8 6.5  Albumin 3.5 - 5.0 g/dL 3.8 4.3 3.8  AST 15 - 41 U/L _0 ALT 0 - 44 U/L _1 Alk Phosphatase 38 - 126 U/L 42 43 46  Total Bilirubin 0.3 - 1.2 mg/dL 0.8 0.6 0.9  Bilirubin, Direct 0.0 - 0.3 mg/dL - - -    Lab Results  Component Value Date/Time    TSH 1.233 04/15/2020 09:44 AM   TSH 0.867 07/11/2019 04:19 PM   TSH 1.11 10/24/2018 04:19 PM    CBC Latest Ref Rng & Units 03/19/2020 03/16/2019 11/28/2018  WBC 4.0 - 10.5 K/uL 4.9 7.4 6.6  Hemoglobin 13.0 - 17.0 g/dL 10.8(L) 12.8(L) 13.1  Hematocrit 39.0 - 52.0 % 32.8(L) 36.7(L) 40.6  Platelets 150 - 400 K/uL 183 187.0 177    No results found for: VD25OH  Clinical ASCVD: Yes  The 10-year ASCVD risk score Mikey Bussing DC Jr., et al., 2013) is: 20.5%   Values used to calculate the score:     Age: 68 years     Sex: Male     Is Non-Hispanic African American: No     Diabetic: No     Tobacco smoker: No     Systolic Blood Pressure: 756 mmHg     Is BP treated: Yes     HDL Cholesterol: 32 mg/dL     Total Cholesterol: 194 mg/dL    Depression screen [facility] 2/9 04/16/2020 10/24/2018 09/22/2017  Decreased Interest 0 0 0  Down, Depressed, Hopeless 0 0 0  PHQ - 2 Score 0 0 0  Some recent data might be hidden     CHA2DS2-VASc Score = 3  The patient's score is based upon: CHF History: No HTN History: Yes Diabetes History: No Stroke History: No Vascular Disease History: Yes     Social History   Tobacco Use  Smoking Status Former Smoker  . Packs/day: 2.00  . Years: 30.00  . Pack years: 60.00  . Types: Cigarettes  . Quit date: 01/05/1992  . Years since quitting: 28.9  Smokeless Tobacco Never Used  Tobacco Comment   quit 19 yrs ago   BP Readings from Last 3 Encounters:  11/25/20 122/74  10/24/20 (!) 143/67  09/23/20 140/60   Pulse Readings from Last 3 Encounters:  11/25/20 64  09/23/20 (!) 58  08/27/20 (!) 51   Wt Readings from Last 3 Encounters:  10/24/20 205 lb (93 kg)  09/23/20 218 lb (98.9 kg)  08/27/20 214 lb (97.1 kg)    Assessment/Interventions: Review of patient past medical history, allergies, medications, health status, including review of consultants reports, laboratory and other test data, was performed as part of comprehensive evaluation and provision of chronic care  management services.   SDOH:  (Social Determinants of Health) assessments and interventions performed: Yes SDOH Interventions   Flowsheet Row Most Recent Value  SDOH Interventions   Financial Strain Interventions Other (Comment)  [Repatha and Eliquis will be expensive in donut hole, can pursue Healthwell grant and Eliquis PAP, respectively if needed]      CCM Care Plan  Allergies  Allergen Reactions  . Other Other (See Comments)    Myalgias with elevated CPKs  . Statins     Myalgias with elevated CPKs    Medications Reviewed Today    Reviewed by Charlton Haws, [facility] [place] (Pharmacist) on 12/03/20 at 1358  Med List Status: <None>  Medication Order Taking?  Sig Documenting Provider Last Dose Status Informant  apixaban (ELIQUIS) 5 MG TABS tablet [id number] Yes Take 1 tablet (5 mg total) by mouth 2 (two) times daily. Constance Haw, MD Taking Active   aspirin EC 81 MG EC tablet [id number] Yes Take 1 tablet (81 mg total) by mouth daily. Erlene Quan, PA-C Taking Active Self  Biotin 10000 MCG TABS [id number] Yes Take 10,000 mcg by mouth daily. [provider] Taking Active Self  cholecalciferol (VITAMIN D) 25 MCG (1000 UT) tablet [id number] Yes Take 1,000 Units by mouth daily.  [provider] Taking Active Self  diclofenac sodium (VOLTAREN) 1 % GEL [id number] Yes Apply 2 g topically 4 (four) times daily. Hoyt Koch, MD Taking Active Self  divalproex (DEPAKOTE ER) 500 MG 24 hr tablet [id number] Yes TAKE 1 TABLET(500 MG) BY MOUTH DAILY Hoyt Koch, MD Taking Active   doxycycline (VIBRAMYCIN) 50 MG capsule [id number] Yes Take 50 mg by mouth 2 (two) times daily. [provider] Taking Active   Evolocumab (REPATHA SURECLICK) 381 MG/ML SOAJ [id number] Yes Inject 1 pen into the skin every 14 (fourteen) days. Josue Hector, MD Taking Active   ezetimibe (ZETIA) 10 MG tablet [id number] Yes Take 1 tablet (10 mg total) by mouth daily. Josue Hector, MD  Taking Active   furosemide (LASIX) 20 MG tablet [id number] Yes TAKE 1 TABLET(20 MG) BY MOUTH TWICE DAILY Josue Hector, MD Taking Active   gabapentin (NEURONTIN) 300 MG capsule [id number] Yes TAKE 1 CAPSULE(300 MG) BY MOUTH TWICE DAILY Hoyt Koch, MD Taking Active   Glucosamine-Chondroitin (GLUCOSAMINE [facility] COMPLEX PO) [id number] Yes Take 500 mg by mouth 2 (two) times daily.  [provider] Taking Active Self  hydrALAZINE (APRESOLINE) 25 MG tablet [id number] Yes Take 1 tablet (25 mg total) by mouth in the morning and at bedtime. Constance Haw, MD Taking Active   isosorbide mononitrate (IMDUR) 30 MG 24 hr tablet [id number] Yes TAKE 1 TABLET BY MOUTH EVERY DAY Josue Hector, MD Taking Active   levocetirizine (XYZAL) 5 MG tablet [id number] Yes Take 1 tablet (5 mg total) by mouth every evening. Hoyt Koch, MD Taking Active Self  Multiple Vitamin (MULTIVITAMIN) capsule [id number] Yes Take 1 capsule by mouth daily. [provider] Taking Active Self  nitroGLYCERIN (NITROSTAT) 0.4 MG SL tablet [id number] Yes DISSOLVE 1 TABLET UNDER THE TONGUE EVERY 5 MINUTES FOR 3 DOSES AS NEEDED FOR CHEST PAIN Josue Hector, MD Taking Active   Omega-3 Fatty Acids (FISH OIL) 1200 MG CAPS [id number] Yes Take 1,200 mg by mouth 2 (two) times daily. [provider] Taking Active Self  potassium chloride SA (KLOR-CON) 20 MEQ tablet [id number] Yes TAKE 1 TABLET BY MOUTH EVERY DAY Camnitz, Will Hassell Done, MD Taking Active   ramipril (ALTACE) 10 MG capsule [id number] Yes Take 2 capsules (20 mg total) by mouth daily. Josue Hector, MD Taking Active   tamsulosin (FLOMAX) 0.4 MG CAPS capsule [id number] Yes TAKE ONE CAPSULE BY MOUTH DAILY Hoyt Koch, MD Taking Active   tiZANidine (ZANAFLEX) 4 MG tablet [id number] Yes Take 1 tablet (4 mg total) by mouth every 8 (eight) hours as needed for muscle spasms. Lyndal Pulley, DO Taking Active Self  vitamin C (ASCORBIC ACID) 500 MG  tablet [id number] Yes Take 500 mg by mouth daily. [provider] Taking Active Self  zolpidem (AMBIEN CR) 12.5 MG CR tablet [id number] Yes TAKE 1 TABLET(12.5 MG) BY MOUTH AT BEDTIME AS NEEDED FOR SLEEP  Hoyt Koch, MD Taking Active           Patient Active Problem List   Diagnosis Date Noted  . Statin myopathy 09/25/2020  . Choking 02/06/2020  . Insomnia 02/06/2020  . Persistent atrial fibrillation ([place]) 11/23/2019  . Secondary hypercoagulable state ([place]) 11/23/2019  . OSA (obstructive sleep apnea) 08/27/2019  . Arthralgia 04/09/2019  . Throat congestion 02/23/2019  . Marijuana use 12/02/2018  . Elevated troponin 12/02/2018  . HLD (hyperlipidemia) 11/29/2018  . CAD (coronary artery disease) 11/29/2018  . Chest pain 11/29/2018  . Scalp cyst 10/25/2018  . Degenerative disc disease, lumbar 03/09/2018  . Routine general medical examination at a health care facility 09/22/2017  . BPH without urinary obstruction 07/22/2016  . Anemia, iron deficiency 07/11/2015  . Nonallopathic lesion-rib cage 05/29/2015  . Nonallopathic lesion of thoracic region 05/29/2015  . Nonallopathic lesion of cervical region 05/29/2015  . AV block, 2nd degree 10/29/2013  . Migraine- chronic Verapamil 10/29/2013  . Paroxysmal atrial fibrillation ([place]) 10/26/2013  . Carotid bruit- 58-09% LICA by doppler 9/83 03/14/2012  . ALCOHOL USE 01/23/2009  . Elevated lipids 01/22/2009  . Essential hypertension 01/22/2009  . Bradycardia 01/22/2009  . Aortic aneurysm ([place]) 01/22/2009    Immunization History  Administered Date(s) Administered  . Influenza, High Dose Seasonal PF 06/13/2019  . Influenza,inj,Quad PF,6+ Mos 07/24/2013, 07/25/2014  . Influenza-Unspecified 07/26/2015, 07/24/2016, 06/25/2017, 07/25/2018, 06/26/2019  . Moderna Sars-Covid-2 Vaccination 11/23/2019, 12/21/2019  . Pneumococcal Conjugate-13 10/25/2018  . Pneumococcal Polysaccharide-23 02/06/2020  . Tdap 10/11/2012  . Zoster  07/24/2013  . Zoster Recombinat (Shingrix) 08/12/2018, 09/13/2018, 11/10/2018    Conditions to be addressed/monitored:  Hypertension, Hyperlipidemia, Atrial Fibrillation and Coronary Artery Disease  Care Plan : [place]  Updates made by Charlton Haws, [place] since 12/03/2020 12:00 AM    Problem: Hypertension, Hyperlipidemia, Atrial Fibrillation and Coronary Artery Disease   Priority: High    Long-Range Goal: Disease management   Start Date: 12/03/2020  Expected End Date: 12/03/2021  This Visit's Progress: On track  Priority: High  Note:   Current Barriers:  . Unable to independently monitor therapeutic efficacy . Unable to maintain control of cholesterol  Pharmacist Clinical Goal(s):  Marland Kitchen Over the next 180 days, patient will achieve adherence to monitoring guidelines and medication adherence to achieve therapeutic efficacy . maintain control of cholesterol as evidenced by improvement in LDL to < 55  through collaboration with PharmD and provider.   Interventions: . 1:1 collaboration with Hoyt Koch, MD regarding development and update of comprehensive plan of care as evidenced by provider attestation and co-signature . Inter-disciplinary care team collaboration (see longitudinal plan of care) . Comprehensive medication review performed; medication list updated in electronic medical record  Hypertension (BP goal < 130/80) Controlled - per patient home readings are at goal, no more issues with low BP since changing ramipril to 20 mg at night Current regimen:  ? ramipril 20 mg daily HS ? hydralazine 25 mg twice a day ? isosorbide MN 30 mg daily ? Furosemide 20 mg BID Interventions: ? Discussed BP goals and benefits of medications for prevention of heart attack / stroke Patient self care activities ? Check BP 1-2 times weekly, document, and provide at future appointments ? Ensure daily salt intake < 2300 mg/day   Hyperlipidemia / Coronary artery  disease (LDL goal < 55) Not ideally controlled - pt has started Repatha after most recent lipid panel revealed LDL of 141 Current regimen:  ? ezetimibe 10  mg daily ? Repatha 140 mg [place] q14 days ? isosorbide MN 30 mg daily,  ? nitroglycerin 0.4 mg asneeded  ? aspirin 81 mg daily ? fish oil OTC daily Interventions: ? Discussed cholesterol goals and benefits of medications for prevention of heart attack / stroke ? Discussed benefits of Repatha; pt has succesfully injected 1st 2 doses and denies issues. Can consider Healthwell grant if Repatha is not affordable later on. Patient self care activities ? Continue current medications ? Continue low cholesterol diet and exercise routine   Atrial Fibrillation Controlled - pt is s/p ablation in Oct 2021 and denies issues.  Current regimen:  ? Eliquis 5 mg twice a day Interventions: ? Discussed benefits of anticoagulation for stroke prevention ? Can pursue patient assistance once donut hole is reached Patient self care activities  ? Provide proof of income and out of pocket costs ? Contact prescriber for patient assistance in donut hole  Patient Goals/Self-Care Activities . Over the next 180 days, patient will:  - take medications as prescribed focus on medication adherence by pill box check blood pressure daily, document, and provide at future appointments collaborate with provider on medication access solutions  Follow Up Plan: Telephone follow up appointment with care management team member scheduled for: 6 months      Medication Assistance: None required.  Patient affirms current coverage meets needs.  -Repatha and Eliquis are Tier 3 and will be expensive in donut hole; can pursue Healthwell grant and Eliquis PAP as needed  Patient's preferred pharmacy is:  [facility] [place], [address] AT [address] [place] [address] Phone: [phone] Fax:  [phone]  [facility]) - [place], [address] [place] [address] Phone: [phone] Fax: [phone]  Uses pill box? Yes Pt endorses 100% compliance  We discussed: Current pharmacy is preferred with insurance plan and patient is satisfied with pharmacy services Patient decided to: Continue current medication management strategy  Care Plan and Follow Up Patient Decision:  Patient agrees to Care Plan and Follow-up.  Plan: Telephone follow up appointment with care management team member scheduled for:  6 months  Charlene Brooke, PharmD, [facility] Clinical Pharmacist [facility] [phone]

## 2020-12-03 NOTE — Patient Instructions (Signed)
Visit Information  Phone number for Pharmacist: [phone]  Goals Addressed   None    Patient Care Plan: CCM Pharmacy Care Plan    Problem Identified: Hypertension, Hyperlipidemia, Atrial Fibrillation and Coronary Artery Disease   Priority: High    Long-Range Goal: Disease management   Start Date: 12/03/2020  Expected End Date: 12/03/2021  This Visit's Progress: On track  Priority: High  Note:   Current Barriers:  . Unable to independently monitor therapeutic efficacy . Unable to maintain control of cholesterol  Pharmacist Clinical Goal(s):  Marland Kitchen Over the next 180 days, patient will achieve adherence to monitoring guidelines and medication adherence to achieve therapeutic efficacy . maintain control of cholesterol as evidenced by improvement in LDL to < 55  through collaboration with PharmD and provider.   Interventions: . 1:1 collaboration with Hoyt Koch, MD regarding development and update of comprehensive plan of care as evidenced by provider attestation and co-signature . Inter-disciplinary care team collaboration (see longitudinal plan of care) . Comprehensive medication review performed; medication list updated in electronic medical record  Hypertension (BP goal < 130/80) Controlled - per patient home readings are at goal, no more issues with low BP since changing ramipril to 20 mg at night Current regimen:  ? ramipril 20 mg daily HS ? hydralazine 25 mg twice a day ? isosorbide MN 30 mg daily ? Furosemide 20 mg BID Interventions: ? Discussed BP goals and benefits of medications for prevention of heart attack / stroke Patient self care activities ? Check BP 1-2 times weekly, document, and provide at future appointments ? Ensure daily salt intake < 2300 mg/day   Hyperlipidemia / Coronary artery disease (LDL goal < 55) Not ideally controlled - pt has started Repatha after most recent lipid panel revealed LDL of 141 Current regimen:  ? ezetimibe 10 mg  daily ? Repatha 140 mg [place] q14 days ? isosorbide MN 30 mg daily,  ? nitroglycerin 0.4 mg asneeded  ? aspirin 81 mg daily ? fish oil OTC daily Interventions: ? Discussed cholesterol goals and benefits of medications for prevention of heart attack / stroke ? Discussed benefits of Repatha; pt has succesfully injected 1st 2 doses and denies issues. Can consider [employer] grant if Repatha is not affordable later on. Patient self care activities ? Continue current medications ? Continue low cholesterol diet and exercise routine   Atrial Fibrillation Controlled - pt is s/p ablation in Oct 2021 and denies issues.  Current regimen:  ? Eliquis 5 mg twice a day Interventions: ? Discussed benefits of anticoagulation for stroke prevention ? Can pursue patient assistance once donut hole is reached Patient self care activities  ? Provide proof of income and out of pocket costs ? Contact prescriber for patient assistance in donut hole  Patient Goals/Self-Care Activities . Over the next 180 days, patient will:  - take medications as prescribed focus on medication adherence by pill box check blood pressure daily, document, and provide at future appointments collaborate with provider on medication access solutions  Follow Up Plan: Telephone follow up appointment with care management team member scheduled for: 6 months     The patient verbalized understanding of instructions, educational materials, and care plan provided today and declined offer to receive copy of patient instructions, educational materials, and care plan.  Telephone follow up appointment with pharmacy team member scheduled for: 6 months  Charlene Brooke, PharmD, [facility] Clinical Pharmacist [facility] [phone]

## 2020-12-12 ENCOUNTER — Ambulatory Visit: Payer: PPO | Admitting: Cardiology

## 2020-12-12 ENCOUNTER — Other Ambulatory Visit: Payer: Self-pay

## 2020-12-12 VITALS — BP 136/72 | HR 70 | Ht 70.0 in | Wt 215.6 lb

## 2020-12-12 DIAGNOSIS — G4733 Obstructive sleep apnea (adult) (pediatric): Secondary | ICD-10-CM | POA: Diagnosis not present

## 2020-12-12 DIAGNOSIS — I10 Essential (primary) hypertension: Secondary | ICD-10-CM | POA: Diagnosis not present

## 2020-12-12 NOTE — Progress Notes (Signed)
Date:  12/12/2020   ID:  [name], DOB Oct 26, 1950, MRN [id number] The patient was identified using 2 identifiers.  PCP:  Hoyt Koch, MD  Cardiologist:  Jenkins Rouge, MD  Sleep Medicine:  Leanora Cover, MD Electrophysiologist:  Will Meredith Leeds, MD   Chief Complaint:  OSA  History of Present Illness:    [name] Brown is a 70 y.o. male  With a hx of afib, SSS, CAD.  He has a hx of OSA with home sleep study documenting this in 2018.  He was placed on CPAP but at that time did not tolerate CPAP therapy.  He was using a nasal mask or nasal pillow mask but it bothered him so much he could not use it.  He was referred to Dr. Ron Parker for an oral device but apparently needed implants put in because he does not have enough teeth but it was cost prohibitive.   He decided to retry CPAP and underwent CPAP tiration in 08/2019.  Due to ongoing respiratory events he could not be adequately titrated on CPAP and was changed to BiPAP.  He again could not tolerate BiPAP.   Due to problems with excessive daytime sleepiness he was referred to ENT to be evaluated for left hypoglossal nerve stimulator.    He underwent Hypoglossal N stimulator placement 07/30/2020 and underwent device activation. He is now here today to see how he is doing prior to in lab sleep study. He was seen back and was doing well with his device and was at level 6.  Today he is here for fine tuning before undergoing in lab sleep study.  He is doing great.  His daytime sleepiness has significantly decreased and he is not snoring.  He is at level 6.  He tried several times getting to level 7 but was too uncomfortable.  He tells me that he needs a longer delay time to when started to help him get to sleep.   Prior CV studies:   The following studies were reviewed today:  Inspire download  Past Medical History:  Diagnosis Date  . Aortic aneurysm ([place]) Endograf 2009  . Arthritis   . Atrial fibrillation ([place])   . Bradycardia    . CAD (coronary artery disease) CABG 03/2000   Median sternotomy for coronary artery bypass grafting x 3 (left  . HTN (hypertension)   . Hx of CABG 2001   severe 3 vessel disease  . Hx of colonic polyp 06/2010   Hyperplastic   . Hyperlipidemia   . Myocardial infarction ([place])   . Sleep apnea    Past Surgical History:  Procedure Laterality Date  . ATRIAL FIBRILLATION ABLATION N/A 03/19/2020   Procedure: ATRIAL FIBRILLATION ABLATION;  Surgeon: Constance Haw, MD;  Location: [place] CV LAB;  Service: Cardiovascular;  Laterality: N/A;  . CARDIOVERSION N/A 11/28/2018   Procedure: CARDIOVERSION;  Surgeon: Elouise Munroe, MD;  Location: [facility] ENDOSCOPY;  Service: Cardiovascular;  Laterality: N/A;  . COLONOSCOPY    . CORONARY ARTERY BYPASS GRAFT     AAA stent graft on 12/01/2007 at [facility]  coronary bypass in 2001. Dr. Roxy Manns.  Marland Kitchen DRUG INDUCED ENDOSCOPY Right 02/18/2020   Procedure: DRUG INDUCED ENDOSCOPY;  Surgeon: Melida Quitter, MD;  Location: [place];  Service: ENT;  Laterality: Right;  . FOOT SURGERY Left 07/19/2017  . IMPLANTATION OF HYPOGLOSSAL NERVE STIMULATOR Right 07/30/2020   Procedure: IMPLANTATION OF HYPOGLOSSAL NERVE STIMULATOR Implantation of Chest wall  Therapist, occupational of Implanted Neurostimulator Pulse Generator;  Surgeon: Melida Quitter, MD;  Location: [place];  Service: ENT;  Laterality: Right;  . LEFT HEART CATHETERIZATION WITH CORONARY ANGIOGRAM N/A 10/26/2013   Procedure: LEFT HEART CATHETERIZATION WITH CORONARY ANGIOGRAM;  Surgeon: Peter M Martinique, MD;  Location: [facility] CATH LAB;  Service: Cardiovascular;  Laterality: N/A;  . MASS EXCISION Right 08/10/2019   Procedure: EXCISION OF CYST FROM RIGHT SCALP;  Surgeon: Autumn Messing III, MD;  Location: [place];  Service: General;  Laterality: Right;     Current Meds  Medication Sig  . apixaban (ELIQUIS) 5 MG TABS tablet Take  1 tablet (5 mg total) by mouth 2 (two) times daily.  Marland Kitchen aspirin EC 81 MG EC tablet Take 1 tablet (81 mg total) by mouth daily.  . Biotin 10000 MCG TABS Take 10,000 mcg by mouth daily.  . cholecalciferol (VITAMIN D) 25 MCG (1000 UT) tablet Take 1,000 Units by mouth daily.   . diclofenac sodium (VOLTAREN) 1 % GEL Apply 2 g topically 4 (four) times daily.  . divalproex (DEPAKOTE ER) 500 MG 24 hr tablet TAKE 1 TABLET(500 MG) BY MOUTH DAILY  . doxycycline (VIBRAMYCIN) 50 MG capsule Take 50 mg by mouth 2 (two) times daily.  . Evolocumab (REPATHA SURECLICK) 235 MG/ML SOAJ Inject 1 pen into the skin every 14 (fourteen) days.  Marland Kitchen ezetimibe (ZETIA) 10 MG tablet Take 1 tablet (10 mg total) by mouth daily.  . furosemide (LASIX) 20 MG tablet TAKE 1 TABLET(20 MG) BY MOUTH TWICE DAILY  . gabapentin (NEURONTIN) 300 MG capsule TAKE 1 CAPSULE(300 MG) BY MOUTH TWICE DAILY  . Glucosamine-Chondroitin (GLUCOSAMINE CHONDR COMPLEX PO) Take 500 mg by mouth 2 (two) times daily.   . hydrALAZINE (APRESOLINE) 25 MG tablet Take 1 tablet (25 mg total) by mouth in the morning and at bedtime.  . isosorbide mononitrate (IMDUR) 30 MG 24 hr tablet TAKE 1 TABLET BY MOUTH EVERY DAY  . levocetirizine (XYZAL) 5 MG tablet Take 1 tablet (5 mg total) by mouth every evening.  . Multiple Vitamin (MULTIVITAMIN) capsule Take 1 capsule by mouth daily.  . nitroGLYCERIN (NITROSTAT) 0.4 MG SL tablet DISSOLVE 1 TABLET UNDER THE TONGUE EVERY 5 MINUTES FOR 3 DOSES AS NEEDED FOR CHEST PAIN  . Omega-3 Fatty Acids (FISH OIL) 1200 MG CAPS Take 1,200 mg by mouth 2 (two) times daily.  . potassium chloride SA (KLOR-CON) 20 MEQ tablet TAKE 1 TABLET BY MOUTH EVERY DAY  . ramipril (ALTACE) 10 MG capsule Take 2 capsules (20 mg total) by mouth daily.  . tamsulosin (FLOMAX) 0.4 MG CAPS capsule TAKE ONE CAPSULE BY MOUTH DAILY  . tiZANidine (ZANAFLEX) 4 MG tablet Take 1 tablet (4 mg total) by mouth every 8 (eight) hours as needed for muscle spasms.  . vitamin C  (ASCORBIC ACID) 500 MG tablet Take 500 mg by mouth daily.  Marland Kitchen zolpidem (AMBIEN CR) 12.5 MG CR tablet TAKE 1 TABLET(12.5 MG) BY MOUTH AT BEDTIME AS NEEDED FOR SLEEP     Allergies:   Other and Statins   Social History   Tobacco Use  . Smoking status: Former Smoker    Packs/day: 2.00    Years: 30.00    Pack years: 60.00    Types: Cigarettes    Quit date: 01/05/1992    Years since quitting: 28.9  . Smokeless tobacco: Never Used  . Tobacco comment: quit 19 yrs ago  Vaping Use  . Vaping Use: Never  used  Substance Use Topics  . Alcohol use: No  . Drug use: No     Family Hx: The patient's family history includes Heart attack in his father and mother; Heart disease in his father and mother. There is no history of Rectal cancer, Stomach cancer, Pancreatic cancer, Esophageal cancer, Colon polyps, or Colon cancer.  ROS:   Please see the history of present illness.     All other systems reviewed and are negative.   Labs/Other Tests and Data Reviewed:    Recent Labs: 03/19/2020: Hemoglobin 10.8; Platelets 183 04/15/2020: ALT 18; TSH 1.233 11/25/2020: BUN 24; Creatinine, Ser 1.22; Potassium 4.7; Sodium 142   Recent Lipid Panel Lab Results  Component Value Date/Time   CHOL 194 11/04/2020 07:55 AM   TRIG 114 11/04/2020 07:55 AM   HDL 32 (L) 11/04/2020 07:55 AM   CHOLHDL 6.1 (H) 11/04/2020 07:55 AM   CHOLHDL 4.8 11/29/2018 02:23 AM   LDLCALC 141 (H) 11/04/2020 07:55 AM    Wt Readings from Last 3 Encounters:  12/12/20 215 lb 9.6 oz (97.8 kg)  10/24/20 205 lb (93 kg)  09/23/20 218 lb (98.9 kg)     Objective:    Vital Signs:  BP 136/72   Pulse 70   Ht 5\' 10"  (1.778 m)   Wt 215 lb 9.6 oz (97.8 kg)   BMI 30.94 kg/m    GEN: Well nourished, well developed in no acute distress HEENT: Normal NECK: No JVD; No carotid bruits LYMPHATICS: No lymphadenopathy CARDIAC:RRR, no murmurs, rubs, gallops RESPIRATORY:  Clear to auscultation without rales, wheezing or rhonchi  ABDOMEN: Soft,  non-tender, non-distended MUSCULOSKELETAL:  No edema; No deformity  SKIN: Warm and dry NEUROLOGIC:  Alert and oriented x 3 PSYCHIATRIC:  Normal affect    ASSESSMENT & PLAN:    1.  OSA -he has had a sleep study in 2018 and was tried on CPAP but he did not tolerate the PAP both in 2018 and on retry in 2021. -the oral device was cost prohibitive due to needing major dental work prior to getting the device so that is not an option -now s/p hypoglossal N stimulator -parameters  as follows:  Amplitude 2.3V  Patient control 2.3-3.3V  Pulse width 25mcs  Rate 33Hz   Start delay 30 min  Pause time 15 min  Therapy duration 8 hours  Electrodes +/-/+ -he is doing well with his device and has noticed significant improvement in his sx of daytime sleepiness -he denies any problems with tongue discomfort at level 6 and no snoring -he is having problems with it starting before he gets to sleep so his delay has been set to 45 minutes -he is using 6.7 hours nightly -he has his sleep study set up for the end of March and then we will see him back 2 weeks later  2.  HTN -Bp controlled on exam today -continue Hydralazine 25mg  BID, Ramipril 20mg  daily and Imdur 30mg  daily.   Medication Adjustments/Labs and Tests Ordered: Current medicines are reviewed at length with the patient today.  Concerns regarding medicines are outlined above.  Tests Ordered: No orders of the defined types were placed in this encounter.  Medication Changes: No orders of the defined types were placed in this encounter.   Disposition: Set up for in lab sleep study with Inspire titration  Signed, Fransico Him, MD  12/12/2020 8:25 AM    [place]

## 2020-12-12 NOTE — Patient Instructions (Signed)
- 

## 2020-12-16 ENCOUNTER — Telehealth (INDEPENDENT_AMBULATORY_CARE_PROVIDER_SITE_OTHER): Payer: PPO | Admitting: Internal Medicine

## 2020-12-16 DIAGNOSIS — R6889 Other general symptoms and signs: Secondary | ICD-10-CM | POA: Diagnosis not present

## 2020-12-16 MED ORDER — ALBUTEROL SULFATE HFA 108 (90 BASE) MCG/ACT IN AERS: 2.0000 | INHALATION_SPRAY | Freq: Four times a day (QID) | RESPIRATORY_TRACT | 0 refills | Status: DC | PRN

## 2020-12-16 MED ORDER — MONTELUKAST SODIUM 10 MG PO TABS: 10.0000 mg | ORAL_TABLET | Freq: Every day | ORAL | 3 refills | Status: DC

## 2020-12-16 NOTE — Progress Notes (Signed)
Virtual Visit via Video Note  I connected with [name] Brown on 12/16/20 at  1:40 PM EST by a video enabled telemedicine application and verified that I am speaking with the correct person using two identifiers.  The patient and the provider were at separate locations throughout the entire encounter. Patient location: home, Provider location: work   I discussed the limitations of evaluation and management by telemedicine and the availability of in person appointments. The patient expressed understanding and agreed to proceed. The patient and the provider were the only parties present for the visit unless noted in HPI below.  History of Present Illness: The patient is a 70 y.o. man with visit for allergies. Started his whole life and yearlong. Has tried otc and wants to try something stronger. He was prescribed xyzal last year and this did help some more but not working well anymore. Denies fevers or chills or acute symptoms. No sinus pain. No concerns for covid-19. Is vaccinated against covid-19 and counseled about booster. Overall it is stable. Sometimes gets a wheeze sound in his lungs and would like an inhaler he can use if needed.   Observations/Objective: Appearance: normal, breathing appears normal, casual grooming, abdomen does not appear distended, throat normal, memory normal, mental status is A and O times 3  Assessment and Plan: See problem oriented charting  Follow Up Instructions: rx singulair and albuterol prn  I discussed the assessment and treatment plan with the patient. The patient was provided an opportunity to ask questions and all were answered. The patient agreed with the plan and demonstrated an understanding of the instructions.   The patient was advised to call back or seek an in-person evaluation if the symptoms worsen or if the condition fails to improve as anticipated.  Hoyt Koch, MD

## 2020-12-18 ENCOUNTER — Encounter: Payer: Self-pay | Admitting: Internal Medicine

## 2020-12-18 NOTE — Assessment & Plan Note (Signed)
Rx montelukast and albuterol. If no resolution should have in person visit.

## 2020-12-25 ENCOUNTER — Other Ambulatory Visit: Payer: Self-pay

## 2020-12-25 ENCOUNTER — Ambulatory Visit (INDEPENDENT_AMBULATORY_CARE_PROVIDER_SITE_OTHER): Payer: PPO | Admitting: Internal Medicine

## 2020-12-25 ENCOUNTER — Other Ambulatory Visit: Payer: Self-pay | Admitting: Cardiovascular Disease

## 2020-12-25 ENCOUNTER — Encounter: Payer: Self-pay | Admitting: Internal Medicine

## 2020-12-25 VITALS — BP 134/90 | HR 62 | Temp 97.9°F | Resp 18 | Ht 70.0 in | Wt 209.2 lb

## 2020-12-25 DIAGNOSIS — R1013 Epigastric pain: Secondary | ICD-10-CM

## 2020-12-25 LAB — LIPASE: Lipase: 52 U/L (ref 11.0–59.0)

## 2020-12-25 LAB — COMPREHENSIVE METABOLIC PANEL
ALT: 13 U/L (ref 0–53)
AST: 16 U/L (ref 0–37)
Albumin: 4.1 g/dL (ref 3.5–5.2)
Alkaline Phosphatase: 49 U/L (ref 39–117)
BUN: 23 mg/dL (ref 6–23)
CO2: 30 mEq/L (ref 19–32)
Calcium: 9.8 mg/dL (ref 8.4–10.5)
Chloride: 104 mEq/L (ref 96–112)
Creatinine, Ser: 1.18 mg/dL (ref 0.40–1.50)
GFR: 63.17 mL/min (ref >60.00)
Glucose, Bld: 82 mg/dL (ref 70–99)
Potassium: 4.5 mEq/L (ref 3.5–5.1)
Sodium: 142 mEq/L (ref 135–145)
Total Bilirubin: 0.5 mg/dL (ref 0.2–1.2)
Total Protein: 7 g/dL (ref 6.0–8.3)

## 2020-12-25 LAB — CBC
HCT: 36.8 % — ABNORMAL LOW (ref 39.0–52.0)
Hemoglobin: 12.4 g/dL — ABNORMAL LOW (ref 13.0–17.0)
MCHC: 33.7 g/dL (ref 30.0–36.0)
MCV: 94 fl (ref 78.0–100.0)
Platelets: 217 10*3/uL (ref 150.0–400.0)
RBC: 3.92 Mil/uL — ABNORMAL LOW (ref 4.22–5.81)
RDW: 16.1 % — ABNORMAL HIGH (ref 11.5–15.5)
WBC: 6.6 10*3/uL (ref 4.0–10.5)

## 2020-12-25 MED ORDER — PANTOPRAZOLE SODIUM 40 MG PO TBEC: 40.0000 mg | DELAYED_RELEASE_TABLET | Freq: Every day | ORAL | 3 refills | Status: DC

## 2020-12-25 NOTE — Patient Instructions (Signed)
We have sent in protonix to take 1 pill daily before breakfast for 1-2 months. You should see results in 3-5 days.

## 2020-12-25 NOTE — Progress Notes (Unsigned)
   Subjective:   Patient ID: [name] Brown, male    DOB: 1951-03-31, 70 y.o.   MRN: [id number]  HPI The patient is a 70 YO man coming in for follow up throat congestion (cleared up with singulair, drainage is mostly gone, denies fevers or chills, and epigastric pain (thought this was related to drainage, but not drainage is gone pain is still present, pepto bismol can help some, sometimes goes into the back, sometimes dark stool after pepto all day but no other times, denies lightheadedness or dizziness).   Review of Systems  Constitutional: Negative.   HENT: Negative.   Eyes: Negative.   Respiratory: Negative for cough, chest tightness and shortness of breath.   Cardiovascular: Negative for chest pain, palpitations and leg swelling.  Gastrointestinal: Positive for abdominal pain and nausea. Negative for abdominal distention, constipation, diarrhea and vomiting.  Musculoskeletal: Negative.   Skin: Negative.   Neurological: Negative.   Psychiatric/Behavioral: Negative.     Objective:  Physical Exam Constitutional:      Appearance: He is well-developed.  HENT:     Head: Normocephalic and atraumatic.  Cardiovascular:     Rate and Rhythm: Normal rate and regular rhythm.  Pulmonary:     Effort: Pulmonary effort is normal. No respiratory distress.     Breath sounds: Normal breath sounds. No wheezing or rales.  Abdominal:     General: Bowel sounds are normal. There is no distension.     Palpations: Abdomen is soft.     Tenderness: There is no abdominal tenderness. There is no rebound.  Musculoskeletal:     Cervical back: Normal range of motion.  Skin:    General: Skin is warm and dry.  Neurological:     Mental Status: He is alert and oriented to person, place, and time.     Coordination: Coordination normal.     Vitals:   12/25/20 1540  BP: 134/90  Pulse: 62  Resp: 18  Temp: 97.9 F (36.6 C)  TempSrc: Oral  SpO2: 93%  Weight: 209 lb 3.2 oz (94.9 kg)  Height: 5\' 10"   (1.778 m)    This visit occurred during the SARS-CoV-2 public health emergency.  Safety protocols were in place, including screening questions prior to the visit, additional usage of staff PPE, and extensive cleaning of exam room while observing appropriate contact time as indicated for disinfecting solutions.   Assessment & Plan:

## 2020-12-26 ENCOUNTER — Encounter: Payer: Self-pay | Admitting: Internal Medicine

## 2020-12-26 DIAGNOSIS — R1013 Epigastric pain: Secondary | ICD-10-CM | POA: Insufficient documentation

## 2020-12-26 NOTE — Assessment & Plan Note (Signed)
Checking CBC, CMP, lipase. Rx protonix to try for several weeks to see if this helps. Adjust if needed based on labs.

## 2021-01-01 ENCOUNTER — Other Ambulatory Visit: Payer: Self-pay

## 2021-01-01 ENCOUNTER — Encounter (HOSPITAL_COMMUNITY): Payer: Self-pay | Admitting: Physician Assistant

## 2021-01-01 ENCOUNTER — Ambulatory Visit (HOSPITAL_COMMUNITY)
Admission: RE | Admit: 2021-01-01 | Discharge: 2021-01-01 | Disposition: A | Discharge status: Home / Self Care | Payer: PPO | Source: Ambulatory Visit | Attending: Physician Assistant | Admitting: Physician Assistant

## 2021-01-01 VITALS — BP 148/84 | HR 67 | Ht 70.0 in | Wt 211.2 lb

## 2021-01-01 DIAGNOSIS — Z951 Presence of aortocoronary bypass graft: Secondary | ICD-10-CM | POA: Diagnosis not present

## 2021-01-01 DIAGNOSIS — I10 Essential (primary) hypertension: Secondary | ICD-10-CM | POA: Diagnosis not present

## 2021-01-01 DIAGNOSIS — Z87891 Personal history of nicotine dependence: Secondary | ICD-10-CM | POA: Insufficient documentation

## 2021-01-01 DIAGNOSIS — Z7982 Long term (current) use of aspirin: Secondary | ICD-10-CM | POA: Diagnosis not present

## 2021-01-01 DIAGNOSIS — E785 Hyperlipidemia, unspecified: Secondary | ICD-10-CM | POA: Diagnosis not present

## 2021-01-01 DIAGNOSIS — Z888 Allergy status to other drugs, medicaments and biological substances status: Secondary | ICD-10-CM | POA: Diagnosis not present

## 2021-01-01 DIAGNOSIS — I2510 Atherosclerotic heart disease of native coronary artery without angina pectoris: Secondary | ICD-10-CM | POA: Diagnosis not present

## 2021-01-01 DIAGNOSIS — G4733 Obstructive sleep apnea (adult) (pediatric): Secondary | ICD-10-CM | POA: Insufficient documentation

## 2021-01-01 DIAGNOSIS — Z8249 Family history of ischemic heart disease and other diseases of the circulatory system: Secondary | ICD-10-CM | POA: Diagnosis not present

## 2021-01-01 DIAGNOSIS — Z79899 Other long term (current) drug therapy: Secondary | ICD-10-CM | POA: Insufficient documentation

## 2021-01-01 DIAGNOSIS — D6869 Other thrombophilia: Secondary | ICD-10-CM

## 2021-01-01 DIAGNOSIS — I4819 Other persistent atrial fibrillation: Secondary | ICD-10-CM | POA: Diagnosis not present

## 2021-01-01 DIAGNOSIS — Z7901 Long term (current) use of anticoagulants: Secondary | ICD-10-CM | POA: Diagnosis not present

## 2021-01-01 NOTE — Patient Instructions (Signed)
Cardioversion scheduled for Tuesday, April 5th  - Arrive at the [facility] and go to admitting at [facility] not eat or drink anything after midnight the night prior to your procedure.  - Take all your morning medication (except diabetic medications) with a sip of water prior to arrival.  - You will not be able to drive home after your procedure.  - Do NOT miss any doses of your blood thinner - if you should miss a dose please notify our office immediately.  - If you feel as if you go back into normal rhythm prior to scheduled cardioversion, please notify our office immediately. If your procedure is canceled in the cardioversion suite you will be charged a cancellation fee.

## 2021-01-01 NOTE — H&P (View-Only) (Signed)
Primary Care Physician: Hoyt Koch, MD Primary Cardiologist: Dr Johnsie Cancel Primary Electrophysiologist: Dr Curt Bears Referring Physician: Dr Tonny Bollman is a 70 y.o. male with a history of coronary artery disease status post three-vessel CABG, hypertension, OSA, hyperlipidemia, and atrial fibrillation. He was found to be in atrial fibrillation incidentally on office visit 08/30/2017. He was put on Eliquis at the time. Beta-blockers were not started due to resting bradycardia. January 2020 he was noted increasing dyspnea on exertion. He had a cardioversion 11/28/18 and was started on amiodarone. He does report today that he was diagnosed with OSA a few years ago but could not tolerate CPAP therapy. He is on Eliquis for a CHADS2VASC score of 3. Patient is s/p afib ablation 03/19/20.   On follow up today, patient reports for the past 1-2 months he has noticed increased fatigue with exertion. ECG shows rate controlled afib today. There were no specific triggers that he could identify. Of note, he recently had an Inspire device placed for his OSA.   Today, he denies symptoms of palpitations, chest pain, orthopnea, PND, lower extremity edema, dizziness, presyncope, syncope, bleeding, or neurologic sequela. The patient is tolerating medications without difficulties and is otherwise without complaint today.    Atrial Fibrillation Risk Factors:  he does have symptoms or diagnosis of sleep apnea. he is on Inspire therapy.  he does not have a history of rheumatic fever. he does not have a history of alcohol use. The patient does not have a history of early familial atrial fibrillation or other arrhythmias.  he has a BMI of Body mass index is 30.3 kg/m.Marland Kitchen Filed Weights   01/01/21 1403  Weight: 95.8 kg    Family History  Problem Relation Age of Onset  . Heart attack Mother   . Heart disease Mother   . Heart attack Father   . Heart disease Father   . Rectal cancer Neg Hx    . Stomach cancer Neg Hx   . Pancreatic cancer Neg Hx   . Esophageal cancer Neg Hx   . Colon polyps Neg Hx   . Colon cancer Neg Hx      Atrial Fibrillation Management history:  Previous antiarrhythmic drugs: amiodarone Previous cardioversions: 11/2018 Previous ablations: 03/19/20 CHADS2VASC score: 3 Anticoagulation history: Eliquis   Past Medical History:  Diagnosis Date  . Aortic aneurysm ([place]) Endograf 2009  . Arthritis   . Atrial fibrillation ([place])   . Bradycardia   . CAD (coronary artery disease) CABG 03/2000   Median sternotomy for coronary artery bypass grafting x 3 (left  . HTN (hypertension)   . Hx of CABG 2001   severe 3 vessel disease  . Hx of colonic polyp 06/2010   Hyperplastic   . Hyperlipidemia   . Myocardial infarction ([place])   . Sleep apnea    Past Surgical History:  Procedure Laterality Date  . ATRIAL FIBRILLATION ABLATION N/A 03/19/2020   Procedure: ATRIAL FIBRILLATION ABLATION;  Surgeon: Constance Haw, MD;  Location: [facility];  Service: Cardiovascular;  Laterality: N/A;  . CARDIOVERSION N/A 11/28/2018   Procedure: CARDIOVERSION;  Surgeon: Elouise Munroe, MD;  Location: [facility] ENDOSCOPY;  Service: Cardiovascular;  Laterality: N/A;  . COLONOSCOPY    . CORONARY ARTERY BYPASS GRAFT     AAA stent graft on 12/01/2007 at [facility]  coronary bypass in 2001. Dr. Roxy Manns.  Marland Kitchen DRUG INDUCED ENDOSCOPY Right 02/18/2020   Procedure: DRUG INDUCED ENDOSCOPY;  Surgeon:  Melida Quitter, MD;  Location: [facility];  Service: ENT;  Laterality: Right;  . FOOT SURGERY Left 07/19/2017  . IMPLANTATION OF HYPOGLOSSAL NERVE STIMULATOR Right 07/30/2020   Procedure: IMPLANTATION OF HYPOGLOSSAL NERVE STIMULATOR Implantation of Chest wall respirator Sensor Electrode Electronic Analysis of Implanted Neurostimulator Pulse Generator;  Surgeon: Melida Quitter, MD;  Location: [facility];  Service: ENT;  Laterality: Right;  . LEFT  HEART CATHETERIZATION WITH CORONARY ANGIOGRAM N/A 10/26/2013   Procedure: LEFT HEART CATHETERIZATION WITH CORONARY ANGIOGRAM;  Surgeon: Peter M Martinique, MD;  Location: [facility] CATH LAB;  Service: Cardiovascular;  Laterality: N/A;  . MASS EXCISION Right 08/10/2019   Procedure: EXCISION OF CYST FROM RIGHT SCALP;  Surgeon: Jovita Kussmaul, MD;  Location: [facility];  Service: General;  Laterality: Right;    Current Outpatient Medications  Medication Sig Dispense Refill  . albuterol (VENTOLIN HFA) 108 (90 Base) MCG/ACT inhaler Inhale 2 puffs into the lungs every 6 (six) hours as needed for wheezing or shortness of breath. 8 g 0  . apixaban (ELIQUIS) 5 MG TABS tablet Take 1 tablet (5 mg total) by mouth 2 (two) times daily. 180 tablet 2  . aspirin EC 81 MG EC tablet Take 1 tablet (81 mg total) by mouth daily.    . Biotin 10000 MCG TABS Take 10,000 mcg by mouth daily.    . cholecalciferol (VITAMIN D) 25 MCG (1000 UT) tablet Take 1,000 Units by mouth daily.     . diclofenac sodium (VOLTAREN) 1 % GEL Apply 2 g topically 4 (four) times daily. 100 g 3  . divalproex (DEPAKOTE ER) 500 MG 24 hr tablet TAKE 1 TABLET(500 MG) BY MOUTH DAILY 90 tablet 0  . doxycycline (VIBRAMYCIN) 50 MG capsule Take 50 mg by mouth 2 (two) times daily.    . Evolocumab (REPATHA SURECLICK) 323 MG/ML SOAJ Inject 1 pen into the skin every 14 (fourteen) days. 6 mL 3  . ezetimibe (ZETIA) 10 MG tablet Take 1 tablet (10 mg total) by mouth daily. 90 tablet 3  . furosemide (LASIX) 20 MG tablet TAKE 1 TABLET(20 MG) BY MOUTH TWICE DAILY 180 tablet 3  . gabapentin (NEURONTIN) 300 MG capsule TAKE 1 CAPSULE(300 MG) BY MOUTH TWICE DAILY 180 capsule 2  . Glucosamine-Chondroitin (GLUCOSAMINE CHONDR COMPLEX PO) Take 500 mg by mouth 2 (two) times daily.     . hydrALAZINE (APRESOLINE) 25 MG tablet Take 1 tablet (25 mg total) by mouth in the morning and at bedtime. 270 tablet 2  . isosorbide mononitrate (IMDUR) 30 MG 24 hr tablet TAKE 1 TABLET  BY MOUTH EVERY DAY 90 tablet 2  . montelukast (SINGULAIR) 10 MG tablet Take 1 tablet (10 mg total) by mouth at bedtime. 90 tablet 3  . Multiple Vitamin (MULTIVITAMIN) capsule Take 1 capsule by mouth daily.    . nitroGLYCERIN (NITROSTAT) 0.4 MG SL tablet DISSOLVE 1 TABLET UNDER THE TONGUE EVERY 5 MINUTES FOR 3 DOSES AS NEEDED FOR CHEST PAIN 25 tablet 4  . Omega-3 Fatty Acids (FISH OIL) 1200 MG CAPS Take 1,200 mg by mouth 2 (two) times daily.    . pantoprazole (PROTONIX) 40 MG tablet Take 1 tablet (40 mg total) by mouth daily. 30 tablet 3  . potassium chloride SA (KLOR-CON) 20 MEQ tablet TAKE 1 TABLET BY MOUTH EVERY DAY 90 tablet 2  . ramipril (ALTACE) 10 MG capsule Take 2 capsules (20 mg total) by mouth daily. 180 capsule 3  . tamsulosin (FLOMAX) 0.4 MG CAPS  capsule TAKE ONE CAPSULE BY MOUTH DAILY 90 capsule 3  . tiZANidine (ZANAFLEX) 4 MG tablet Take 1 tablet (4 mg total) by mouth every 8 (eight) hours as needed for muscle spasms. 270 tablet 3  . vitamin C (ASCORBIC ACID) 500 MG tablet Take 500 mg by mouth daily.    Marland Kitchen zolpidem (AMBIEN CR) 12.5 MG CR tablet TAKE 1 TABLET(12.5 MG) BY MOUTH AT BEDTIME AS NEEDED FOR SLEEP 90 tablet 0   No current facility-administered medications for this encounter.    Allergies  Allergen Reactions  . Statins     Myalgias with elevated CPKs    Social History   Socioeconomic History  . Marital status: Married    Spouse name: Not on file  . Number of children: Not on file  . Years of education: Not on file  . Highest education level: Not on file  Occupational History  . Not on file  Tobacco Use  . Smoking status: Former Smoker    Packs/day: 2.00    Years: 30.00    Pack years: 60.00    Types: Cigarettes    Quit date: 01/05/1992    Years since quitting: 29.0  . Smokeless tobacco: Never Used  . Tobacco comment: quit 19 yrs ago  Vaping Use  . Vaping Use: Never used  Substance and Sexual Activity  . Alcohol use: No  . Drug use: No  . Sexual  activity: Not on file  Other Topics Concern  . 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   Social Determinants of Health   Financial Resource Strain: Medium Risk  . Difficulty of Paying Living Expenses: Somewhat hard  Food Insecurity: No Food Insecurity  . Worried About Charity fundraiser in the Last Year: Never true  . Ran Out of Food in the Last Year: Never true  Transportation Needs: No Transportation Needs  . Lack of Transportation (Medical): No  . Lack of Transportation (Non-Medical): No  Physical Activity: Sufficiently Active  . Days of Exercise per Week: 5 days  . Minutes of Exercise per Session: 30 min  Stress: No Stress Concern Present  . Feeling of Stress : Not at all  Social Connections: Unknown  . Frequency of Communication with Friends and Family: More than three times a week  . Frequency of Social Gatherings with Friends and Family: More than three times a week  . Attends Religious Services: Patient refused  . Active Member of Clubs or Organizations: Patient refused  . Attends Archivist Meetings: Patient refused  . Marital Status: Married  Human resources officer Violence: Not At Risk  . Fear of Current or Ex-Partner: No  . Emotionally Abused: No  . Physically Abused: No  . Sexually Abused: No     ROS- All systems are reviewed and negative except as per the HPI above.  Physical Exam: Vitals:   01/01/21 1403  BP: (!) 148/84  Pulse: 67  Weight: 95.8 kg  Height: 5\' 10"  (1.778 m)    GEN- The patient is a well appearing obese male, alert and oriented x 3 today.   HEENT-head normocephalic, atraumatic, sclera clear, conjunctiva pink, hearing intact, trachea midline. Lungs- Clear to ausculation bilaterally, normal work of breathing Heart- irregular rate and rhythm, no murmurs, rubs or gallops  GI- soft, NT, ND, + BS Extremities- no clubbing, cyanosis, or edema MS- no significant  deformity or atrophy Skin- no rash  or lesion Psych- euthymic mood, full affect Neuro- strength and sensation are intact   Wt Readings from Last 3 Encounters:  01/01/21 95.8 kg  12/25/20 94.9 kg  12/12/20 97.8 kg    EKG today demonstrates  Afib  Vent. rate 67 BPM PR interval * ms QRS duration 104 ms QT/QTc 426/450 ms  Echo 01/21/20 demonstrated  1. Left ventricular ejection fraction, by estimation, is 55 to 60%. The  left ventricle has normal function. Left ventricular endocardial border  not optimally defined to evaluate regional wall motion. There is mild left  ventricular hypertrophy. Left  ventricular diastolic parameters are consistent with Grade II diastolic  dysfunction (pseudonormalization).  2. Right ventricular systolic function is normal. The right ventricular  size is mildly enlarged. There is mildly elevated pulmonary artery  systolic pressure.  3. Left atrial size was moderately dilated.  4. Right atrial size was mildly dilated.  5. The mitral valve is normal in structure. Mild mitral valve  regurgitation.  6. The aortic valve is tricuspid. Aortic valve regurgitation is mild.  Mild aortic valve sclerosis is present, with no evidence of aortic valve  stenosis.  7. The inferior vena cava is normal in size with greater than 50%  respiratory variability, suggesting right atrial pressure of 3 mmHg.   Epic records are reviewed at length today  CHA2DS2-VASc Score = 3  The patient's score is based upon: CHF History: No HTN History: Yes Diabetes History: No Stroke History: No Vascular Disease History: Yes      ASSESSMENT AND PLAN: 1. Persistent Atrial Fibrillation (ICD10:  I48.19) The patient's CHA2DS2-VASc score is 3, indicating a 3.2% annual risk of stroke.   S/p afib ablation 03/19/20 with Dr Curt Bears. Patient back in persistent atrial fibrillation. We discussed therapeutic options today including DCCV. Will arrange for DCCV. Recent lab work reviewed.   Continue Eliquis 5 mg BID. He denies any missed doses in the last 3 weeks.   2. Secondary Hypercoagulable State (ICD10:  D68.69) The patient is at significant risk for stroke/thromboembolism based upon his CHA2DS2-VASc Score of 3.  Continue Apixaban (Eliquis).   3. HTN Mildly elevated, typical for him when in afib. Will reassess in SR.  4. Obstructive sleep apnea Inspire device placed 07/30/20 Followed by Dr Redmond Baseman and Dr Radford Pax  5. CAD S/p CABG. No anginal symptoms.    Follow up in the AF clinic one week post DCCV.    [facility] [address] [place], [place] [address] [phone] 01/01/2021 2:09 PM

## 2021-01-01 NOTE — Progress Notes (Signed)
Primary Care Physician: Hoyt Koch, MD Primary Cardiologist: Dr Johnsie Cancel Primary Electrophysiologist: Dr Curt Bears Referring Physician: Dr Tonny Bollman is a 70 y.o. male with a history of coronary artery disease status post three-vessel CABG, hypertension, OSA, hyperlipidemia, and atrial fibrillation. He was found to be in atrial fibrillation incidentally on office visit 08/30/2017. He was put on Eliquis at the time. Beta-blockers were not started due to resting bradycardia. January 2020 he was noted increasing dyspnea on exertion. He had a cardioversion 11/28/18 and was started on amiodarone. He does report today that he was diagnosed with OSA a few years ago but could not tolerate CPAP therapy. He is on Eliquis for a CHADS2VASC score of 3. Patient is s/p afib ablation 03/19/20.   On follow up today, patient reports for the past 1-2 months he has noticed increased fatigue with exertion. ECG shows rate controlled afib today. There were no specific triggers that he could identify. Of note, he recently had an Inspire device placed for his OSA.   Today, he denies symptoms of palpitations, chest pain, orthopnea, PND, lower extremity edema, dizziness, presyncope, syncope, bleeding, or neurologic sequela. The patient is tolerating medications without difficulties and is otherwise without complaint today.    Atrial Fibrillation Risk Factors:  he does have symptoms or diagnosis of sleep apnea. he is on Inspire therapy.  he does not have a history of rheumatic fever. he does not have a history of alcohol use. The patient does not have a history of early familial atrial fibrillation or other arrhythmias.  he has a BMI of Body mass index is 30.3 kg/m.Marland Kitchen Filed Weights   01/01/21 1403  Weight: 95.8 kg    Family History  Problem Relation Age of Onset  . Heart attack Mother   . Heart disease Mother   . Heart attack Father   . Heart disease Father   . Rectal cancer Neg Hx    . Stomach cancer Neg Hx   . Pancreatic cancer Neg Hx   . Esophageal cancer Neg Hx   . Colon polyps Neg Hx   . Colon cancer Neg Hx      Atrial Fibrillation Management history:  Previous antiarrhythmic drugs: amiodarone Previous cardioversions: 11/2018 Previous ablations: 03/19/20 CHADS2VASC score: 3 Anticoagulation history: Eliquis   Past Medical History:  Diagnosis Date  . Aortic aneurysm ([place]) Endograf 2009  . Arthritis   . Atrial fibrillation ([place])   . Bradycardia   . CAD (coronary artery disease) CABG 03/2000   Median sternotomy for coronary artery bypass grafting x 3 (left  . HTN (hypertension)   . Hx of CABG 2001   severe 3 vessel disease  . Hx of colonic polyp 06/2010   Hyperplastic   . Hyperlipidemia   . Myocardial infarction ([place])   . Sleep apnea    Past Surgical History:  Procedure Laterality Date  . ATRIAL FIBRILLATION ABLATION N/A 03/19/2020   Procedure: ATRIAL FIBRILLATION ABLATION;  Surgeon: Constance Haw, MD;  Location: [facility] LAB;  Service: Cardiovascular;  Laterality: N/A;  . CARDIOVERSION N/A 11/28/2018   Procedure: CARDIOVERSION;  Surgeon: Elouise Munroe, MD;  Location: [facility] ENDOSCOPY;  Service: Cardiovascular;  Laterality: N/A;  . COLONOSCOPY    . CORONARY ARTERY BYPASS GRAFT     AAA stent graft on 12/01/2007 at [facility]  coronary bypass in 2001. Dr. Roxy Manns.  Marland Kitchen DRUG INDUCED ENDOSCOPY Right 02/18/2020   Procedure: DRUG INDUCED ENDOSCOPY;  Surgeon:  Melida Quitter, MD;  Location: [facility];  Service: ENT;  Laterality: Right;  . FOOT SURGERY Left 07/19/2017  . IMPLANTATION OF HYPOGLOSSAL NERVE STIMULATOR Right 07/30/2020   Procedure: IMPLANTATION OF HYPOGLOSSAL NERVE STIMULATOR Implantation of Chest wall respirator Sensor Electrode Electronic Analysis of Implanted Neurostimulator Pulse Generator;  Surgeon: Melida Quitter, MD;  Location: [facility];  Service: ENT;  Laterality: Right;  . LEFT  HEART CATHETERIZATION WITH CORONARY ANGIOGRAM N/A 10/26/2013   Procedure: LEFT HEART CATHETERIZATION WITH CORONARY ANGIOGRAM;  Surgeon: Peter M Martinique, MD;  Location: [facility] CATH LAB;  Service: Cardiovascular;  Laterality: N/A;  . MASS EXCISION Right 08/10/2019   Procedure: EXCISION OF CYST FROM RIGHT SCALP;  Surgeon: Jovita Kussmaul, MD;  Location: [place];  Service: General;  Laterality: Right;    Current Outpatient Medications  Medication Sig Dispense Refill  . albuterol (VENTOLIN HFA) 108 (90 Base) MCG/ACT inhaler Inhale 2 puffs into the lungs every 6 (six) hours as needed for wheezing or shortness of breath. 8 g 0  . apixaban (ELIQUIS) 5 MG TABS tablet Take 1 tablet (5 mg total) by mouth 2 (two) times daily. 180 tablet 2  . aspirin EC 81 MG EC tablet Take 1 tablet (81 mg total) by mouth daily.    . Biotin 10000 MCG TABS Take 10,000 mcg by mouth daily.    . cholecalciferol (VITAMIN D) 25 MCG (1000 UT) tablet Take 1,000 Units by mouth daily.     . diclofenac sodium (VOLTAREN) 1 % GEL Apply 2 g topically 4 (four) times daily. 100 g 3  . divalproex (DEPAKOTE ER) 500 MG 24 hr tablet TAKE 1 TABLET(500 MG) BY MOUTH DAILY 90 tablet 0  . doxycycline (VIBRAMYCIN) 50 MG capsule Take 50 mg by mouth 2 (two) times daily.    . Evolocumab (REPATHA SURECLICK) 409 MG/ML SOAJ Inject 1 pen into the skin every 14 (fourteen) days. 6 mL 3  . ezetimibe (ZETIA) 10 MG tablet Take 1 tablet (10 mg total) by mouth daily. 90 tablet 3  . furosemide (LASIX) 20 MG tablet TAKE 1 TABLET(20 MG) BY MOUTH TWICE DAILY 180 tablet 3  . gabapentin (NEURONTIN) 300 MG capsule TAKE 1 CAPSULE(300 MG) BY MOUTH TWICE DAILY 180 capsule 2  . Glucosamine-Chondroitin (GLUCOSAMINE CHONDR COMPLEX PO) Take 500 mg by mouth 2 (two) times daily.     . hydrALAZINE (APRESOLINE) 25 MG tablet Take 1 tablet (25 mg total) by mouth in the morning and at bedtime. 270 tablet 2  . isosorbide mononitrate (IMDUR) 30 MG 24 hr tablet TAKE 1 TABLET  BY MOUTH EVERY DAY 90 tablet 2  . montelukast (SINGULAIR) 10 MG tablet Take 1 tablet (10 mg total) by mouth at bedtime. 90 tablet 3  . Multiple Vitamin (MULTIVITAMIN) capsule Take 1 capsule by mouth daily.    . nitroGLYCERIN (NITROSTAT) 0.4 MG SL tablet DISSOLVE 1 TABLET UNDER THE TONGUE EVERY 5 MINUTES FOR 3 DOSES AS NEEDED FOR CHEST PAIN 25 tablet 4  . Omega-3 Fatty Acids (FISH OIL) 1200 MG CAPS Take 1,200 mg by mouth 2 (two) times daily.    . pantoprazole (PROTONIX) 40 MG tablet Take 1 tablet (40 mg total) by mouth daily. 30 tablet 3  . potassium chloride SA (KLOR-CON) 20 MEQ tablet TAKE 1 TABLET BY MOUTH EVERY DAY 90 tablet 2  . ramipril (ALTACE) 10 MG capsule Take 2 capsules (20 mg total) by mouth daily. 180 capsule 3  . tamsulosin (FLOMAX) 0.4 MG CAPS  capsule TAKE ONE CAPSULE BY MOUTH DAILY 90 capsule 3  . tiZANidine (ZANAFLEX) 4 MG tablet Take 1 tablet (4 mg total) by mouth every 8 (eight) hours as needed for muscle spasms. 270 tablet 3  . vitamin C (ASCORBIC ACID) 500 MG tablet Take 500 mg by mouth daily.    Marland Kitchen zolpidem (AMBIEN CR) 12.5 MG CR tablet TAKE 1 TABLET(12.5 MG) BY MOUTH AT BEDTIME AS NEEDED FOR SLEEP 90 tablet 0   No current facility-administered medications for this encounter.    Allergies  Allergen Reactions  . Statins     Myalgias with elevated CPKs    Social History   Socioeconomic History  . Marital status: Married    Spouse name: Not on file  . Number of children: Not on file  . Years of education: Not on file  . Highest education level: Not on file  Occupational History  . Not on file  Tobacco Use  . Smoking status: Former Smoker    Packs/day: 2.00    Years: 30.00    Pack years: 60.00    Types: Cigarettes    Quit date: 01/05/1992    Years since quitting: 29.0  . Smokeless tobacco: Never Used  . Tobacco comment: quit 19 yrs ago  Vaping Use  . Vaping Use: Never used  Substance and Sexual Activity  . Alcohol use: No  . Drug use: No  . Sexual  activity: Not on file  Other Topics Concern  . 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   Social Determinants of Health   Financial Resource Strain: Medium Risk  . Difficulty of Paying Living Expenses: Somewhat hard  Food Insecurity: No Food Insecurity  . Worried About Charity fundraiser in the Last Year: Never true  . Ran Out of Food in the Last Year: Never true  Transportation Needs: No Transportation Needs  . Lack of Transportation (Medical): No  . Lack of Transportation (Non-Medical): No  Physical Activity: Sufficiently Active  . Days of Exercise per Week: 5 days  . Minutes of Exercise per Session: 30 min  Stress: No Stress Concern Present  . Feeling of Stress : Not at all  Social Connections: Unknown  . Frequency of Communication with Friends and Family: More than three times a week  . Frequency of Social Gatherings with Friends and Family: More than three times a week  . Attends Religious Services: Patient refused  . Active Member of Clubs or Organizations: Patient refused  . Attends Archivist Meetings: Patient refused  . Marital Status: Married  Human resources officer Violence: Not At Risk  . Fear of Current or Ex-Partner: No  . Emotionally Abused: No  . Physically Abused: No  . Sexually Abused: No     ROS- All systems are reviewed and negative except as per the HPI above.  Physical Exam: Vitals:   01/01/21 1403  BP: (!) 148/84  Pulse: 67  Weight: 95.8 kg  Height: 5\' 10"  (1.778 m)    GEN- The patient is a well appearing obese male, alert and oriented x 3 today.   HEENT-head normocephalic, atraumatic, sclera clear, conjunctiva pink, hearing intact, trachea midline. Lungs- Clear to ausculation bilaterally, normal work of breathing Heart- irregular rate and rhythm, no murmurs, rubs or gallops  GI- soft, NT, ND, + BS Extremities- no clubbing, cyanosis, or edema MS- no significant  deformity or atrophy Skin- no rash  or lesion Psych- euthymic mood, full affect Neuro- strength and sensation are intact   Wt Readings from Last 3 Encounters:  01/01/21 95.8 kg  12/25/20 94.9 kg  12/12/20 97.8 kg    EKG today demonstrates  Afib  Vent. rate 67 BPM PR interval * ms QRS duration 104 ms QT/QTc 426/450 ms  Echo 01/21/20 demonstrated  1. Left ventricular ejection fraction, by estimation, is 55 to 60%. The  left ventricle has normal function. Left ventricular endocardial border  not optimally defined to evaluate regional wall motion. There is mild left  ventricular hypertrophy. Left  ventricular diastolic parameters are consistent with Grade II diastolic  dysfunction (pseudonormalization).  2. Right ventricular systolic function is normal. The right ventricular  size is mildly enlarged. There is mildly elevated pulmonary artery  systolic pressure.  3. Left atrial size was moderately dilated.  4. Right atrial size was mildly dilated.  5. The mitral valve is normal in structure. Mild mitral valve  regurgitation.  6. The aortic valve is tricuspid. Aortic valve regurgitation is mild.  Mild aortic valve sclerosis is present, with no evidence of aortic valve  stenosis.  7. The inferior vena cava is normal in size with greater than 50%  respiratory variability, suggesting right atrial pressure of 3 mmHg.   Epic records are reviewed at length today  CHA2DS2-VASc Score = 3  The patient's score is based upon: CHF History: No HTN History: Yes Diabetes History: No Stroke History: No Vascular Disease History: Yes      ASSESSMENT AND PLAN: 1. Persistent Atrial Fibrillation (ICD10:  I48.19) The patient's CHA2DS2-VASc score is 3, indicating a 3.2% annual risk of stroke.   S/p afib ablation 03/19/20 with Dr Curt Bears. Patient back in persistent atrial fibrillation. We discussed therapeutic options today including DCCV. Will arrange for DCCV. Recent lab work reviewed.   Continue Eliquis 5 mg BID. He denies any missed doses in the last 3 weeks.   2. Secondary Hypercoagulable State (ICD10:  D68.69) The patient is at significant risk for stroke/thromboembolism based upon his CHA2DS2-VASc Score of 3.  Continue Apixaban (Eliquis).   3. HTN Mildly elevated, typical for him when in afib. Will reassess in SR.  4. Obstructive sleep apnea Inspire device placed 07/30/20 Followed by Dr Redmond Baseman and Dr Radford Pax  5. CAD S/p CABG. No anginal symptoms.    Follow up in the AF clinic one week post DCCV.    [facility] [address] [place], [place] [address] [phone] 01/01/2021 2:09 PM

## 2021-01-07 ENCOUNTER — Other Ambulatory Visit: Payer: Self-pay

## 2021-01-07 ENCOUNTER — Telehealth (HOSPITAL_COMMUNITY): Payer: Self-pay | Admitting: *Deleted

## 2021-01-07 ENCOUNTER — Ambulatory Visit (HOSPITAL_BASED_OUTPATIENT_CLINIC_OR_DEPARTMENT_OTHER): Payer: PPO | Attending: Cardiology | Admitting: Cardiology

## 2021-01-07 DIAGNOSIS — G4733 Obstructive sleep apnea (adult) (pediatric): Secondary | ICD-10-CM | POA: Diagnosis not present

## 2021-01-07 DIAGNOSIS — G4736 Sleep related hypoventilation in conditions classified elsewhere: Secondary | ICD-10-CM | POA: Insufficient documentation

## 2021-01-07 DIAGNOSIS — I4891 Unspecified atrial fibrillation: Secondary | ICD-10-CM | POA: Insufficient documentation

## 2021-01-07 DIAGNOSIS — R0902 Hypoxemia: Secondary | ICD-10-CM | POA: Insufficient documentation

## 2021-01-07 NOTE — Telephone Encounter (Signed)
Please take care of setting up this appt with Inspire rep to reprogram

## 2021-01-07 NOTE — Telephone Encounter (Addendum)
Per inspire device co - cannot use device for 2 weeks after cardioversion until reprogrammed by Dr. Radford Pax.  Pt is not due to see Dr. Radford Pax again until 4/28 but pt would prefer to only wait 2 weeks to be reprogrammed.  Will forward to Dr. Kathie Rhodes to have pt appt moved up to of week of 4/19.

## 2021-01-10 ENCOUNTER — Other Ambulatory Visit (HOSPITAL_COMMUNITY)
Admission: RE | Admit: 2021-01-10 | Discharge: 2021-01-10 | Disposition: A | Discharge status: Home / Self Care | Payer: PPO | Source: Ambulatory Visit | Attending: Internal Medicine | Admitting: Internal Medicine

## 2021-01-10 DIAGNOSIS — Z01812 Encounter for preprocedural laboratory examination: Secondary | ICD-10-CM | POA: Diagnosis not present

## 2021-01-10 DIAGNOSIS — Z20822 Contact with and (suspected) exposure to covid-19: Secondary | ICD-10-CM | POA: Diagnosis not present

## 2021-01-10 LAB — SARS CORONAVIRUS 2 (TAT 6-24 HRS): SARS Coronavirus 2: NEGATIVE

## 2021-01-12 ENCOUNTER — Other Ambulatory Visit: Payer: Self-pay | Admitting: *Deleted

## 2021-01-12 MED ORDER — ELIQUIS 5 MG PO TABS: 5.0000 mg | ORAL_TABLET | Freq: Two times a day (BID) | ORAL | 2 refills | Status: DC

## 2021-01-12 NOTE — Telephone Encounter (Signed)
Eliquis 5mg  paper refill request received. Patient is 70 years old, weight-96.2kg, Crea-1.18 on 12/25/2020, Diagnosis-Afib, and last seen by [place] PA on  01/01/2021. Dose is appropriate based on dosing criteria. Will send in refill to requested pharmacy.

## 2021-01-13 ENCOUNTER — Ambulatory Visit (HOSPITAL_COMMUNITY): Payer: PPO | Admitting: Certified Registered Nurse Anesthetist

## 2021-01-13 ENCOUNTER — Encounter (HOSPITAL_COMMUNITY)
Admission: RE | Disposition: A | Discharge status: Home / Self Care | Payer: Self-pay | Source: Home / Self Care | Attending: Internal Medicine

## 2021-01-13 ENCOUNTER — Ambulatory Visit (HOSPITAL_COMMUNITY)
Admission: RE | Admit: 2021-01-13 | Discharge: 2021-01-13 | Disposition: A | Discharge status: Home / Self Care | Payer: PPO | Attending: Internal Medicine | Admitting: Internal Medicine

## 2021-01-13 ENCOUNTER — Encounter (HOSPITAL_COMMUNITY): Payer: Self-pay | Admitting: Internal Medicine

## 2021-01-13 DIAGNOSIS — I10 Essential (primary) hypertension: Secondary | ICD-10-CM | POA: Insufficient documentation

## 2021-01-13 DIAGNOSIS — I4819 Other persistent atrial fibrillation: Secondary | ICD-10-CM | POA: Insufficient documentation

## 2021-01-13 DIAGNOSIS — Z79899 Other long term (current) drug therapy: Secondary | ICD-10-CM | POA: Diagnosis not present

## 2021-01-13 DIAGNOSIS — G4733 Obstructive sleep apnea (adult) (pediatric): Secondary | ICD-10-CM | POA: Diagnosis not present

## 2021-01-13 DIAGNOSIS — Z7982 Long term (current) use of aspirin: Secondary | ICD-10-CM | POA: Insufficient documentation

## 2021-01-13 DIAGNOSIS — I2510 Atherosclerotic heart disease of native coronary artery without angina pectoris: Secondary | ICD-10-CM | POA: Insufficient documentation

## 2021-01-13 DIAGNOSIS — Z7901 Long term (current) use of anticoagulants: Secondary | ICD-10-CM | POA: Diagnosis not present

## 2021-01-13 DIAGNOSIS — D6869 Other thrombophilia: Secondary | ICD-10-CM | POA: Diagnosis not present

## 2021-01-13 DIAGNOSIS — Z951 Presence of aortocoronary bypass graft: Secondary | ICD-10-CM | POA: Diagnosis not present

## 2021-01-13 DIAGNOSIS — I441 Atrioventricular block, second degree: Secondary | ICD-10-CM | POA: Diagnosis not present

## 2021-01-13 DIAGNOSIS — Z87891 Personal history of nicotine dependence: Secondary | ICD-10-CM | POA: Diagnosis not present

## 2021-01-13 DIAGNOSIS — D509 Iron deficiency anemia, unspecified: Secondary | ICD-10-CM | POA: Diagnosis not present

## 2021-01-13 DIAGNOSIS — I480 Paroxysmal atrial fibrillation: Secondary | ICD-10-CM | POA: Diagnosis not present

## 2021-01-13 HISTORY — PX: CARDIOVERSION: SHX1299

## 2021-01-13 SURGERY — CARDIOVERSION
Anesthesia: Monitor Anesthesia Care

## 2021-01-13 MED ORDER — LIDOCAINE 2% (20 MG/ML) 5 ML SYRINGE
INTRAMUSCULAR | Status: DC | PRN
  Administered 2021-01-13: 60 mg via INTRAVENOUS

## 2021-01-13 MED ORDER — PROPOFOL 10 MG/ML IV BOLUS
INTRAVENOUS | Status: DC | PRN
  Administered 2021-01-13: 70 mg via INTRAVENOUS

## 2021-01-13 MED ORDER — PROMETHAZINE HCL 25 MG/ML IJ SOLN: 6.2500 mg | INTRAMUSCULAR | Status: DC | PRN

## 2021-01-13 MED ORDER — SODIUM CHLORIDE 0.9 % IV SOLN: INTRAVENOUS | Status: DC | PRN

## 2021-01-13 NOTE — Procedures (Signed)
   Patient Name: [name], [name] Date: 01/07/2021 Gender: Male D.O.B: Feb 17, 1951 Age (years): 30 Referring Provider: Fransico Him MD, ABSM Height (inches): 70 Interpreting Physician: Fransico Him MD, ABSM Weight (lbs): 206 RPSGT: Carolin Coy BMI: 30 MRN: [id number] Neck Size: 15.00  CLINICAL INFORMATION The patient is referred for a Inspire titration to treat sleep apnea.  SLEEP STUDY TECHNIQUE As per the AASM Manual for the Scoring of Sleep and Associated Events v2.3 (April 2016) with a hypopnea requiring 4% desaturations.  The channels recorded and monitored were frontal, central and occipital EEG, electrooculogram (EOG), submentalis EMG (chin), nasal and oral airflow, thoracic and abdominal wall motion, anterior tibialis EMG, snore microphone, electrocardiogram, and pulse oximetry. Continuous positive airway pressure (CPAP) was initiated at the beginning of the study and titrated to treat sleep-disordered breathing.  MEDICATIONS Medications self-administered by patient taken the night of the study : GABAPENTIN, TRAZODONE, ACETAMINOPHEN, BENADRYL, AMBIEN, TYLENOL ER  TECHNICIAN COMMENTS Comments added by technician: PATIENT WAS ORDERED AS AINSPIRE TITRATION. Comments added by scorer: N/A  RESPIRATORY PARAMETERS Optimal output (V):N/A AHI at Optimal Pressure (/hr):N/A Overall Minimal O2 (%):81.0  Supine % at Optimal Pressure (%):N/A Minimal O2 at Optimal output (%):N/A   SLEEP ARCHITECTURE The study was initiated at 10:16:33 PM and ended at 5:16:20 AM.  Sleep onset time was 12.1 minutes and the sleep efficiency was 72.5%. The total sleep time was 304.5 minutes.  The patient spent 27.3% of the night in stage N1 sleep, 64.9% in stage N2 sleep, 0.0% in stage N3 and 7.9% in REM.Stage REM latency was 141.5 minutes  Wake after sleep onset was 103.2. Alpha intrusion was absent. Supine sleep was 0.83%.  CARDIAC DATA The 2 lead EKG demonstrated atrial fibrillation. The  mean heart rate was 53.7 beats per minute. Other EKG findings include: PVCs.  LEG MOVEMENT DATA The total Periodic Limb Movements of Sleep (PLMS) were 0. The PLMS index was 0.0. A PLMS index of <15 is considered normal in adults.  IMPRESSIONS - An optimal PAP pressure could not be selected for this patient based on the available study data. - Central sleep apnea was not noted during this titration (CAI = 2.4/h). - Moderate oxygen desaturations were observed during this titration (min O2 = 81.0%). - The patient snored with soft snoring volume during this titration study. - 2-lead EKG demonstrated: PVCs and atrial fibrillation - Clinically significant periodic limb movements were not noted during this study. Arousals associated with PLMs were rare.  DIAGNOSIS - Obstructive Sleep Apnea (G47.33) - Nocturnal Hypoxemia - Atrial Fibrillation  RECOMMENDATIONS - Inadequate Inspire titration with significant ongoing respiratory events at a peak voltage of 3.6V. - Avoid alcohol, sedatives and other CNS depressants that may worsen sleep apnea and disrupt normal sleep architecture. - Sleep hygiene should be reviewed to assess factors that may improve sleep quality. - Weight management and regular exercise should be initiated or continued. - Return to Sleep Center for re-evaluation. - May benefit from repeat sleep induced endoscopy.  [Electronically signed] 01/13/2021 10:02 PM  Fransico Him MD, ABSM Diplomate, American Board of Sleep Medicine

## 2021-01-13 NOTE — Discharge Instructions (Signed)
Electrical Cardioversion Electrical cardioversion is the delivery of a jolt of electricity to restore a normal rhythm to the heart. A rhythm that is too fast or is not regular keeps the heart from pumping well. In this procedure, sticky patches or metal paddles are placed on the chest to deliver electricity to the heart from a device. This procedure may be done in an emergency if:  There is low or no blood pressure as a result of the heart rhythm.  Normal rhythm must be restored as fast as possible to protect the brain and heart from further damage.  It may save a life. This may also be a scheduled procedure for irregular or fast heart rhythms that are not immediately life-threatening. Tell a health care provider about:  Any allergies you have.  All medicines you are taking, including vitamins, herbs, eye drops, creams, and over-the-counter medicines.  Any problems you or family members have had with anesthetic medicines.  Any blood disorders you have.  Any surgeries you have had.  Any medical conditions you have.  Whether you are pregnant or may be pregnant. What are the risks? Generally, this is a safe procedure. However, problems may occur, including:  Allergic reactions to medicines.  A blood clot that breaks free and travels to other parts of your body.  The possible return of an abnormal heart rhythm within hours or days after the procedure.  Your heart stopping (cardiac arrest). This is rare. What happens before the procedure? Medicines  Your health care provider may have you start taking: ? Blood-thinning medicines (anticoagulants) so your blood does not clot as easily. ? Medicines to help stabilize your heart rate and rhythm.  Ask your health care provider about: ? Changing or stopping your regular medicines. This is especially important if you are taking diabetes medicines or blood thinners. ? Taking medicines such as aspirin and ibuprofen. These medicines can  thin your blood. Do not take these medicines unless your health care provider tells you to take them. ? Taking over-the-counter medicines, vitamins, herbs, and supplements. General instructions  Follow instructions from your health care provider about eating or drinking restrictions.  Plan to have someone take you home from the hospital or clinic.  If you will be going home right after the procedure, plan to have someone with you for 24 hours.  Ask your health care provider what steps will be taken to help prevent infection. These may include washing your skin with a germ-killing soap. What happens during the procedure?  An IV will be inserted into one of your veins.  Sticky patches (electrodes) or metal paddles may be placed on your chest.  You will be given a medicine to help you relax (sedative).  An electrical shock will be delivered. The procedure may vary among health care providers and hospitals.   What can I expect after the procedure?  Your blood pressure, heart rate, breathing rate, and blood oxygen level will be monitored until you leave the hospital or clinic.  Your heart rhythm will be watched to make sure it does not change.  You may have some redness on the skin where the shocks were given. Follow these instructions at home:  Do not drive for 24 hours if you were given a sedative during your procedure.  Take over-the-counter and prescription medicines only as told by your health care provider.  Ask your health care provider how to check your pulse. Check it often.  Rest for 48 hours after the procedure   or as told by your health care provider.  Avoid or limit your caffeine use as told by your health care provider.  Keep all follow-up visits as told by your health care provider. This is important. Contact a health care provider if:  You feel like your heart is beating too quickly or your pulse is not regular.  You have a serious muscle cramp that does not go  away. Get help right away if:  You have discomfort in your chest.  You are dizzy or you feel faint.  You have trouble breathing or you are short of breath.  Your speech is slurred.  You have trouble moving an arm or leg on one side of your body.  Your fingers or toes turn cold or blue. Summary  Electrical cardioversion is the delivery of a jolt of electricity to restore a normal rhythm to the heart.  This procedure may be done right away in an emergency or may be a scheduled procedure if the condition is not an emergency.  Generally, this is a safe procedure.  After the procedure, check your pulse often as told by your health care provider. This information is not intended to replace advice given to you by your health care provider. Make sure you discuss any questions you have with your health care provider. Document Revised: 04/30/2019 Document Reviewed: 04/30/2019 Elsevier Patient Education  2021 Elsevier Inc.  

## 2021-01-13 NOTE — Anesthesia Procedure Notes (Signed)
Procedure Name: General with mask airway Date/Time: 01/13/2021 10:35 AM Performed by: Candis Shine, CRNA Pre-anesthesia Checklist: Emergency Drugs available, Suction available, Patient identified and Patient being monitored Patient Re-evaluated:Patient Re-evaluated prior to induction Oxygen Delivery Method: Ambu bag Preoxygenation: Pre-oxygenation with 100% oxygen Induction Type: IV induction Dental Injury: Teeth and Oropharynx as per pre-operative assessment

## 2021-01-13 NOTE — Anesthesia Preprocedure Evaluation (Addendum)
Anesthesia Evaluation  Patient identified by MRN, date of birth, ID band Patient awake    Reviewed: Allergy & Precautions, NPO status , Patient's Chart, lab work & pertinent test results  History of Anesthesia Complications Negative for: history of anesthetic complications  Airway Mallampati: II  TM Distance: >3 FB Neck ROM: Full    Dental  (+) Missing,    Pulmonary sleep apnea , former smoker,    Pulmonary exam normal        Cardiovascular hypertension, Pt. on medications + CAD, + Past MI and + CABG (2001)  Normal cardiovascular exam+ dysrhythmias Atrial Fibrillation   TTE 01/2020: EF 55-60%, mild LVH, grade II DD, mildly enlarged RV, mildly elevated PASP, moderate LAE, mild RAE, mild MR, mild AR     Neuro/Psych  Headaches, negative psych ROS   GI/Hepatic Neg liver ROS, GERD  Controlled and Medicated,  Endo/Other  negative endocrine ROS  Renal/GU negative Renal ROS  negative genitourinary   Musculoskeletal negative musculoskeletal ROS (+)   Abdominal   Peds  Hematology  (+) anemia ,   Anesthesia Other Findings Day of surgery medications reviewed with patient.  Reproductive/Obstetrics negative OB ROS                            Anesthesia Physical Anesthesia Plan  ASA: III  Anesthesia Plan: MAC   Post-op Pain Management:    Induction:   PONV Risk Score and Plan: Treatment may vary due to age or medical condition and Propofol infusion  Airway Management Planned: Natural Airway and Nasal Cannula  Additional Equipment: None  Intra-op Plan:   Post-operative Plan:   Informed Consent: I have reviewed the patients History and Physical, chart, labs and discussed the procedure including the risks, benefits and alternatives for the proposed anesthesia with the patient or authorized representative who has indicated his/her understanding and acceptance.       Plan Discussed with:  CRNA  Anesthesia Plan Comments:        Anesthesia Quick Evaluation

## 2021-01-13 NOTE — Interval H&P Note (Signed)
History and Physical Interval Note:  01/13/2021 10:31 AM  [name] [name]  has presented today for surgery, with the diagnosis of AFIB.  The various methods of treatment have been discussed with the patient and family. After consideration of risks, benefits and other options for treatment, the patient has consented to  Procedure(s): CARDIOVERSION (N/A) as a surgical intervention.  The patient's history has been reviewed, patient examined, no change in status, stable for surgery.  I have reviewed the patient's chart and labs.  Questions were answered to the patient's satisfaction.     Pixie Casino

## 2021-01-13 NOTE — Transfer of Care (Signed)
Immediate Anesthesia Transfer of Care Note  Patient: [name] [name]  Procedure(s) Performed: CARDIOVERSION (N/A )  Patient Location: Endoscopy Unit  Anesthesia Type:General  Level of Consciousness: awake, alert  and oriented  Airway & Oxygen Therapy: Patient Spontanous Breathing  Post-op Assessment: Report given to RN and Post -op Vital signs reviewed and stable  Post vital signs: Reviewed and stable  Last Vitals:  Vitals Value Taken Time  BP 128/66   Temp    Pulse 71    Resp 15   SpO2 94     Last Pain:  Vitals:   01/13/21 1000  TempSrc: Oral         Complications: No complications documented.

## 2021-01-13 NOTE — CV Procedure (Signed)
   CARDIOVERSION NOTE  Procedure: Electrical Cardioversion Indications:  Atrial Fibrillation  Procedure Details:  Consent: Risks of procedure as well as the alternatives and risks of each were explained to the (patient/caregiver).  Consent for procedure obtained.  Time Out: Verified patient identification, verified procedure, site/side was marked, verified correct patient position, special equipment/implants available, medications/allergies/relevent history reviewed, required imaging and test results available.  Performed  Patient placed on cardiac monitor, pulse oximetry, supplemental oxygen as necessary.  Sedation given: propofol per anesthesia Pacer pads placed anterior and posterior chest.  Cardioverted 1 time(s).  Cardioverted at 200J biphasic.  Impression: Findings: Post procedure EKG shows: NSR Complications: None Patient did tolerate procedure well.  Plan: 1. Successful DCCV to NSR, rates in the 70's.  Time Spent Directly with the Patient:  30 minutes   Pixie Casino, MD, [facility], [place] Director of the [facility]  Cardiovascular [facility] Clinic Diplomate of the American Board of Clinical Lipidology Attending Cardiologist  Direct Dial: [phone]  Fax: [phone]  Website:  [name] 01/13/2021, 10:51 AM

## 2021-01-13 NOTE — Anesthesia Postprocedure Evaluation (Signed)
Anesthesia Post Note  Patient: [name] [name]  Procedure(s) Performed: CARDIOVERSION (N/A )     Patient location during evaluation: PACU Anesthesia Type: MAC Level of consciousness: awake and alert and oriented Pain management: pain level controlled Vital Signs Assessment: post-procedure vital signs reviewed and stable Respiratory status: spontaneous breathing, nonlabored ventilation and respiratory function stable Cardiovascular status: blood pressure returned to baseline Postop Assessment: no apparent nausea or vomiting Anesthetic complications: no   No complications documented.  Last Vitals:  Vitals:   01/13/21 1044 01/13/21 1045  BP:    Pulse: 70 67  Resp: 18 14  Temp:  36.6 C  SpO2: 93% 95%    Last Pain:  Vitals:   01/13/21 1045  TempSrc: Temporal  PainSc: 0-No pain                 [name]

## 2021-01-14 ENCOUNTER — Encounter (HOSPITAL_COMMUNITY): Payer: Self-pay | Admitting: Internal Medicine

## 2021-01-16 NOTE — Telephone Encounter (Signed)
I have put in on for 01/26/21 at 4 pm [name] Brown is available for that day.

## 2021-01-19 ENCOUNTER — Telehealth: Payer: Self-pay | Admitting: Pharmacist

## 2021-01-19 NOTE — Telephone Encounter (Signed)
Pt called clinic to report muscle pain in his arms with Repatha, feels similar to when he took statins. Has given 5 doses of Repatha so far, most recent dose on 4/3. States pain started on 4/6 which was also 1 day after his cardioversion. Unsure if muscle pain coming from [place] or recent cardioversion. He is willing to rechallenge and give 1 more dose of Repatha on 4/17 if he's feeling better by then, if not he will call me. Can consider trying lower dose of Praluent 75mg  Q2W at that time if needed which pt is agreeable to.

## 2021-01-20 ENCOUNTER — Encounter (HOSPITAL_COMMUNITY): Payer: Self-pay | Admitting: Physician Assistant

## 2021-01-20 ENCOUNTER — Other Ambulatory Visit: Payer: Self-pay

## 2021-01-20 ENCOUNTER — Ambulatory Visit (HOSPITAL_COMMUNITY)
Admission: RE | Admit: 2021-01-20 | Discharge: 2021-01-20 | Disposition: A | Discharge status: Home / Self Care | Payer: PPO | Source: Ambulatory Visit | Attending: Physician Assistant | Admitting: Physician Assistant

## 2021-01-20 VITALS — BP 144/80 | HR 72 | Ht 70.0 in | Wt 208.2 lb

## 2021-01-20 DIAGNOSIS — I4819 Other persistent atrial fibrillation: Secondary | ICD-10-CM | POA: Diagnosis not present

## 2021-01-20 DIAGNOSIS — Z87891 Personal history of nicotine dependence: Secondary | ICD-10-CM | POA: Insufficient documentation

## 2021-01-20 DIAGNOSIS — I491 Atrial premature depolarization: Secondary | ICD-10-CM | POA: Diagnosis not present

## 2021-01-20 DIAGNOSIS — Z7901 Long term (current) use of anticoagulants: Secondary | ICD-10-CM | POA: Diagnosis not present

## 2021-01-20 DIAGNOSIS — Z951 Presence of aortocoronary bypass graft: Secondary | ICD-10-CM | POA: Diagnosis not present

## 2021-01-20 DIAGNOSIS — D6869 Other thrombophilia: Secondary | ICD-10-CM | POA: Diagnosis not present

## 2021-01-20 DIAGNOSIS — I080 Rheumatic disorders of both mitral and aortic valves: Secondary | ICD-10-CM | POA: Insufficient documentation

## 2021-01-20 DIAGNOSIS — I2510 Atherosclerotic heart disease of native coronary artery without angina pectoris: Secondary | ICD-10-CM | POA: Insufficient documentation

## 2021-01-20 DIAGNOSIS — G4733 Obstructive sleep apnea (adult) (pediatric): Secondary | ICD-10-CM | POA: Insufficient documentation

## 2021-01-20 DIAGNOSIS — I10 Essential (primary) hypertension: Secondary | ICD-10-CM | POA: Diagnosis not present

## 2021-01-20 NOTE — Progress Notes (Signed)
Primary Care Physician: Hoyt Koch, MD Primary Cardiologist: Dr Johnsie Cancel Primary Electrophysiologist: Dr Curt Bears Referring Physician: Dr Tonny Bollman is a 70 y.o. male with a history of coronary artery disease status post three-vessel CABG, hypertension, OSA, hyperlipidemia, and atrial fibrillation. He was found to be in atrial fibrillation incidentally on office visit 08/30/2017. He was put on Eliquis at the time. Beta-blockers were not started due to resting bradycardia. January 2020 he was noted increasing dyspnea on exertion. He had a cardioversion 11/28/18 and was started on amiodarone. He does report today that he was diagnosed with OSA a few years ago but could not tolerate CPAP therapy. He is on Eliquis for a CHADS2VASC score of 3. Patient is s/p afib ablation 03/19/20.   On follow up today, patient is s/p DCCV on 01/13/21. He reports that he had left sided CP post DCCV but this has resolved. He has noted some increased heart rates (80s-90s) in the morning when he first gets up but his Jodelle Red has shown only SR. He denies any bleeding issues on anticoagulation.   Today, he denies symptoms of palpitations, chest pain, orthopnea, PND, lower extremity edema, dizziness, presyncope, syncope, bleeding, or neurologic sequela. The patient is tolerating medications without difficulties and is otherwise without complaint today.    Atrial Fibrillation Risk Factors:  he does have symptoms or diagnosis of sleep apnea. he is on Inspire therapy.  he does not have a history of rheumatic fever. he does not have a history of alcohol use. The patient does not have a history of early familial atrial fibrillation or other arrhythmias.  he has a BMI of Body mass index is 29.87 kg/m.Marland Kitchen Filed Weights   01/20/21 1057  Weight: 94.4 kg    Family History  Problem Relation Age of Onset  . Heart attack Mother   . Heart disease Mother   . Heart attack Father   . Heart disease  Father   . Rectal cancer Neg Hx   . Stomach cancer Neg Hx   . Pancreatic cancer Neg Hx   . Esophageal cancer Neg Hx   . Colon polyps Neg Hx   . Colon cancer Neg Hx      Atrial Fibrillation Management history:  Previous antiarrhythmic drugs: amiodarone Previous cardioversions: 11/2018, 01/13/21 Previous ablations: 03/19/20 CHADS2VASC score: 3 Anticoagulation history: Eliquis   Past Medical History:  Diagnosis Date  . Aortic aneurysm ([place]) Endograf 2009  . Arthritis   . Atrial fibrillation ([place])   . Bradycardia   . CAD (coronary artery disease) CABG 03/2000   Median sternotomy for coronary artery bypass grafting x 3 (left  . HTN (hypertension)   . Hx of CABG 2001   severe 3 vessel disease  . Hx of colonic polyp 06/2010   Hyperplastic   . Hyperlipidemia   . Myocardial infarction ([place])   . Sleep apnea    Past Surgical History:  Procedure Laterality Date  . ATRIAL FIBRILLATION ABLATION N/A 03/19/2020   Procedure: ATRIAL FIBRILLATION ABLATION;  Surgeon: Constance Haw, MD;  Location: [place] CV LAB;  Service: Cardiovascular;  Laterality: N/A;  . CARDIOVERSION N/A 11/28/2018   Procedure: CARDIOVERSION;  Surgeon: Elouise Munroe, MD;  Location: [facility] ENDOSCOPY;  Service: Cardiovascular;  Laterality: N/A;  . CARDIOVERSION N/A 01/13/2021   Procedure: CARDIOVERSION;  Surgeon: Pixie Casino, MD;  Location[facility] END[facility]COPY;  Service: Cardiovascular;  Laterality: N/A;  . COLONOSCOPY    . CORONARY ARTERY BYPASS GRAFT  AAA stent graft on 12/01/2007 at [facility]  coronary bypass in 2001. Dr. Roxy Manns.  Marland Kitchen DRUG INDUCED ENDOSCOPY Right 02/18/2020   Procedure: DRUG INDUCED ENDOSCOPY;  Surgeon: Melida Quitter, MD;  Location: [place];  Service: ENT;  Laterality: Right;  . FOOT SURGERY Left 07/19/2017  . IMPLANTATION OF HYPOGLOSSAL NERVE STIMULATOR Right 07/30/2020   Procedure: IMPLANTATION OF HYPOGLOSSAL NERVE STIMULATOR Implantation of Chest wall  respirator Sensor Electrode Electronic Analysis of Implanted Neurostimulator Pulse Generator;  Surgeon: Melida Quitter, MD;  Location: [place];  Service: ENT;  Laterality: Right;  . LEFT HEART CATHETERIZATION WITH CORONARY ANGIOGRAM N/A 10/26/2013   Procedure: LEFT HEART CATHETERIZATION WITH CORONARY ANGIOGRAM;  Surgeon: Peter M Martinique, MD;  Location[facility] C[facility] LAB;  Service: Cardiovascular;  Laterality: N/A;  . MASS EXCISION Right 08/10/2019   Procedure: EXCISION OF CYST FROM RIGHT SCALP;  Surgeon: Jovita Kussmaul, MD;  Location: [place];  Service: General;  Laterality: Right;    Current Outpatient Medications  Medication Sig Dispense Refill  . acetaminophen (TYLENOL) 500 MG tablet Take 1,000 mg by mouth every 6 (six) hours as needed (pain).    Marland Kitchen albuterol (VENTOLIN HFA) 108 (90 Base) MCG/ACT inhaler Inhale 2 puffs into the lungs every 6 (six) hours as needed for wheezing or shortness of breath. 8 g 0  . apixaban (ELIQUIS) 5 MG TABS tablet Take 1 tablet (5 mg total) by mouth 2 (two) times daily. 180 tablet 2  . aspirin EC 81 MG EC tablet Take 1 tablet (81 mg total) by mouth daily.    . Biotin 10000 MCG TABS Take 10,000 mcg by mouth in the morning.    . cholecalciferol (VITAMIN D) 25 MCG (1000 UT) tablet Take 1,000 Units by mouth every evening.    . diclofenac sodium (VOLTAREN) 1 % GEL Apply 2 g topically 4 (four) times daily. 100 g 3  . divalproex (DEPAKOTE ER) 500 MG 24 hr tablet TAKE 1 TABLET(500 MG) BY MOUTH DAILY 90 tablet 0  . doxycycline (VIBRAMYCIN) 50 MG capsule Take 50 mg by mouth every evening.    . Evolocumab (REPATHA SURECLICK) 938 MG/ML SOAJ Inject 1 pen into the skin every 14 (fourteen) days. 6 mL 3  . ezetimibe (ZETIA) 10 MG tablet Take 1 tablet (10 mg total) by mouth daily. 90 tablet 3  . furosemide (LASIX) 20 MG tablet TAKE 1 TABLET(20 MG) BY MOUTH TWICE DAILY 180 tablet 3  . gabapentin (NEURONTIN) 300 MG capsule TAKE 1 CAPSULE(300 MG) BY MOUTH  TWICE DAILY 180 capsule 2  . Glucosamine-Chondroitin (GLUCOSAMINE CHONDR COMPLEX PO) Take 500 mg by mouth 2 (two) times daily.     . hydrALAZINE (APRESOLINE) 25 MG tablet Take 1 tablet (25 mg total) by mouth in the morning and at bedtime. 270 tablet 2  . isosorbide mononitrate (IMDUR) 30 MG 24 hr tablet TAKE 1 TABLET BY MOUTH EVERY DAY 90 tablet 2  . montelukast (SINGULAIR) 10 MG tablet Take 1 tablet (10 mg total) by mouth at bedtime. 90 tablet 3  . Multiple Vitamin (MULTIVITAMIN WITH MINERALS) TABS tablet Take 1 tablet by mouth every evening.    . nitroGLYCERIN (NITROSTAT) 0.4 MG SL tablet DISSOLVE 1 TABLET UNDER THE TONGUE EVERY 5 MINUTES FOR 3 DOSES AS NEEDED FOR CHEST PAIN 25 tablet 4  . Omega-3 Fatty Acids (FISH OIL) 1200 MG CAPS Take 1,200 mg by mouth 2 (two) times daily.    . pantoprazole (PROTONIX) 40 MG  tablet Take 1 tablet (40 mg total) by mouth daily. 30 tablet 3  . potassium chloride SA (KLOR-CON) 20 MEQ tablet TAKE 1 TABLET BY MOUTH EVERY DAY 90 tablet 2  . ramipril (ALTACE) 10 MG capsule Take 2 capsules (20 mg total) by mouth daily. 180 capsule 3  . tamsulosin (FLOMAX) 0.4 MG CAPS capsule TAKE ONE CAPSULE BY MOUTH DAILY 90 capsule 3  . tiZANidine (ZANAFLEX) 4 MG tablet Take 1 tablet (4 mg total) by mouth every 8 (eight) hours as needed for muscle spasms. 270 tablet 3  . vitamin C (ASCORBIC ACID) 500 MG tablet Take 500 mg by mouth in the morning.    . zolpidem (AMBIEN CR) 12.5 MG CR tablet TAKE 1 TABLET(12.5 MG) BY MOUTH AT BEDTIME AS NEEDED FOR SLEEP 90 tablet 0   No current facility-administered medications for this encounter.    Allergies  Allergen Reactions  . Statins     Myalgias with elevated CPKs    Social History   Socioeconomic History  . Marital status: Married    Spouse name: Not on file  . Number of children: Not on file  . Years of education: Not on file  . Highest education level: Not on file  Occupational History  . Not on file  Tobacco Use  . Smoking  status: Former Smoker    Packs/day: 2.00    Years: 30.00    Pack years: 60.00    Types: Cigarettes    Quit date: 01/05/1992    Years since quitting: 29.0  . Smokeless tobacco: Never Used  . Tobacco comment: quit 19 yrs ago  Vaping Use  . Vaping Use: Never used  Substance and Sexual Activity  . Alcohol use: No  . Drug use: No  . Sexual activity: Not on file  Other Topics Concern  . 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   Social Determinants of Health   Financial Resource Strain: Medium Risk  . Difficulty of Paying Living Expenses: Somewhat hard  Food Insecurity: No Food Insecurity  . Worried About Charity fundraiser in the Last Year: Never true  . Ran Out of Food in the Last Year: Never true  Transportation Needs: No Transportation Needs  . Lack of Transportation (Medical): No  . Lack of Transportation (Non-Medical): No  Physical Activity: Sufficiently Active  . Days of Exercise per Week: 5 days  . Minutes of Exercise per Session: 30 min  Stress: No Stress Concern Present  . Feeling of Stress : Not at all  Social Connections: Unknown  . Frequency of Communication with Friends and Family: More than three times a week  . Frequency of Social Gatherings with Friends and Family: More than three times a week  . Attends Religious Services: Patient refused  . Active Member of Clubs or Organizations: Patient refused  . Attends Archivist Meetings: Patient refused  . Marital Status: Married  Human resources officer Violence: Not At Risk  . Fear of Current or Ex-Partner: No  . Emotionally Abused: No  . Physically Abused: No  . Sexually Abused: No     ROS- All systems are reviewed and negative except as per the HPI above.  Physical Exam: Vitals:   01/20/21 1057  BP: (!) 144/80  Pulse: 72  Weight: 94.4 kg  Height: 5\' 10"  (1.778 m)    GEN- The patient is a well appearing male, alert and  oriented x 3 today.   HEENT-head normocephalic, atraumatic, sclera clear, conjunctiva pink, hearing intact, trachea midline. Lungs- Clear to ausculation bilaterally, normal work of breathing Heart- Regular rate and rhythm, no murmurs, rubs or gallops  GI- soft, NT, ND, + BS Extremities- no clubbing, cyanosis, or edema MS- no significant deformity or atrophy Skin- no rash or lesion Psych- euthymic mood, full affect Neuro- strength and sensation are intact   Wt Readings from Last 3 Encounters:  01/20/21 94.4 kg  01/07/21 96.2 kg  01/01/21 95.8 kg    EKG today demonstrates  SR Vent. rate 72 BPM PR interval 200 ms QRS duration 108 ms QT/QTcB 392/429 ms  Echo 01/21/20 demonstrated  1. Left ventricular ejection fraction, by estimation, is 55 to 60%. The  left ventricle has normal function. Left ventricular endocardial border  not optimally defined to evaluate regional wall motion. There is mild left  ventricular hypertrophy. Left  ventricular diastolic parameters are consistent with Grade II diastolic  dysfunction (pseudonormalization).  2. Right ventricular systolic function is normal. The right ventricular  size is mildly enlarged. There is mildly elevated pulmonary artery  systolic pressure.  3. Left atrial size was moderately dilated.  4. Right atrial size was mildly dilated.  5. The mitral valve is normal in structure. Mild mitral valve  regurgitation.  6. The aortic valve is tricuspid. Aortic valve regurgitation is mild.  Mild aortic valve sclerosis is present, with no evidence of aortic valve  stenosis.  7. The inferior vena cava is normal in size with greater than 50%  respiratory variability, suggesting right atrial pressure of 3 mmHg.   Epic records are reviewed at length today  CHA2DS2-VASc Score = 3  The patient's score is based upon: CHF History: No HTN History: Yes Diabetes History: No Stroke History: No Vascular Disease History: Yes       ASSESSMENT AND PLAN: 1. Persistent Atrial Fibrillation (ICD10:  I48.19) The patient's CHA2DS2-VASc score is 3, indicating a 3.2% annual risk of stroke.   S/p afib ablation 03/19/20 with Dr Curt Bears. S/p DCCV on 01/13/21. Patient appears to be maintaining SR.  Continue Eliquis 5 mg BID Kardia for home monitoring.   2. Secondary Hypercoagulable State (ICD10:  D68.69) The patient is at significant risk for stroke/thromboembolism based upon his CHA2DS2-VASc Score of 3.  Continue Apixaban (Eliquis).   3. HTN Stable, no changes today.  4. Obstructive sleep apnea Inspire device placed 07/30/20 Followed by Dr Redmond Baseman and Dr Radford Pax  5. CAD S/p CABG. No anginal symptoms.    Follow up with Dr Radford Pax and Dr Curt Bears as scheduled.    [facility] [address] [place], [place] [address] [phone] 01/20/2021 11:03 AM

## 2021-01-21 ENCOUNTER — Telehealth: Payer: Self-pay | Admitting: *Deleted

## 2021-01-21 ENCOUNTER — Other Ambulatory Visit: Payer: Self-pay | Admitting: Internal Medicine

## 2021-01-21 DIAGNOSIS — G44009 Cluster headache syndrome, unspecified, not intractable: Secondary | ICD-10-CM

## 2021-01-21 NOTE — Telephone Encounter (Signed)
-----   Message from Sueanne Margarita, MD sent at 01/13/2021 10:14 PM EDT ----- Patient did not have adequate Inspire in lab titration.  Please notify Inspire rep to see if patient needs repeat sleep induced endoscopy with ENT.

## 2021-01-21 NOTE — Telephone Encounter (Signed)
Informed patient of titration results and verbalized understanding was indicated. Patient understands his titration study showed he did not have adequate Inspire in lab titration. Please notify Inspire rep to see if patient needs repeat sleep induced endoscopy with ENT.  Per Dawna Part Rep, We will take a look at the study when we see the patient on 4/18 and possibly look at changing electrodes first before we do an awake endoscopy. I also want to look at the study to make sure there was not over-titration.   Pt is agreeable to his results.

## 2021-01-22 ENCOUNTER — Ambulatory Visit (HOSPITAL_COMMUNITY)
Admission: RE | Admit: 2021-01-22 | Discharge: 2021-01-22 | Disposition: A | Discharge status: Home / Self Care | Payer: PPO | Source: Ambulatory Visit | Attending: Internal Medicine | Admitting: Internal Medicine

## 2021-01-22 ENCOUNTER — Other Ambulatory Visit: Payer: Self-pay

## 2021-01-22 ENCOUNTER — Other Ambulatory Visit (HOSPITAL_COMMUNITY): Payer: Self-pay | Admitting: Cardiovascular Disease

## 2021-01-22 DIAGNOSIS — R0989 Other specified symptoms and signs involving the circulatory and respiratory systems: Secondary | ICD-10-CM

## 2021-01-22 DIAGNOSIS — I6523 Occlusion and stenosis of bilateral carotid arteries: Secondary | ICD-10-CM

## 2021-01-22 NOTE — Telephone Encounter (Signed)
.   Patient is requesting a refill of the following medications: Requested Prescriptions   Pending Prescriptions Disp Refills  . divalproex (DEPAKOTE ER) 500 MG 24 hr tablet [Pharmacy Med Name: DIVALPROEX EXTENDED RELEASE 500MG  T] 90 tablet 0    Sig: TAKE 1 TABLET(500 MG) BY MOUTH DAILY    Date of patient request: 01/22/21  Last office visit: 12/25/20  Date of last refill: 10/28/20  Last refill amount: 90 Follow up time period per chart: 05/11/21

## 2021-01-26 ENCOUNTER — Ambulatory Visit: Payer: PPO | Admitting: Cardiology

## 2021-01-27 ENCOUNTER — Other Ambulatory Visit: Payer: Self-pay

## 2021-01-27 ENCOUNTER — Encounter: Payer: Self-pay | Admitting: Cardiology

## 2021-01-27 ENCOUNTER — Ambulatory Visit: Payer: PPO | Admitting: Cardiology

## 2021-01-27 VITALS — BP 110/65 | HR 71 | Ht 70.0 in | Wt 206.0 lb

## 2021-01-27 DIAGNOSIS — G4733 Obstructive sleep apnea (adult) (pediatric): Secondary | ICD-10-CM

## 2021-01-27 DIAGNOSIS — I10 Essential (primary) hypertension: Secondary | ICD-10-CM

## 2021-01-27 NOTE — Patient Instructions (Signed)
Medication Instructions:  Your physician recommends that you continue on your current medications as directed. Please refer to the Current Medication list given to you today.  *If you need a refill on your cardiac medications before your next appointment, please call your pharmacy*   Follow-Up: At [facility], you and your health needs are our priority.  As part of our continuing mission to provide you with exceptional heart care, we have created designated Provider Care Teams.  These Care Teams include your primary Cardiologist (physician) and Advanced Practice Providers (APPs -  Physician Assistants and Nurse Practitioners) who all work together to provide you with the care you need, when you need it.  Your next appointment:   4 week(s)  The format for your next appointment:   Virtual Visit   Provider:   Fransico Him, MD

## 2021-01-27 NOTE — Progress Notes (Signed)
Date:  01/27/2021   ID:  [name] Brown, DOB 1951-06-05, MRN [id number] The patient was identified using 2 identifiers.  PCP:  Hoyt Koch, MD  Cardiologist:  Jenkins Rouge, MD  Sleep Medicine:  Leanora Cover, MD Electrophysiologist:  Will Meredith Leeds, MD   Chief Complaint:  OSA  History of Present Illness:    [name] Brown is a 70 y.o. male  With a hx of afib, SSS, CAD.  He has a hx of OSA with home sleep study documenting this in 2018.  He was placed on CPAP but at that time did not tolerate CPAP therapy.  He was using a nasal mask or nasal pillow mask but it bothered him so much he could not use it.  He was referred to Dr. Ron Parker for an oral device but apparently needed implants put in because he does not have enough teeth but it was cost prohibitive.   He decided to retry CPAP and underwent CPAP tiration in 08/2019.  Due to ongoing respiratory events he could not be adequately titrated on CPAP and was changed to BiPAP.  He again could not tolerate BiPAP.   Due to problems with excessive daytime sleepiness he was referred to ENT to be evaluated for left hypoglossal nerve stimulator.    He underwent Hypoglossal N stimulator placement 07/30/2020 and underwent device activation. He  was seen back in office and was doing well with his device and was at level 6.  He underwent titration in the sleep lab but was inadequate due to ongoing respiratory events.  He is now here for device interrogation and reprogramming. He tells me that over the past month he has started waking up a lot around 4am and has to shut his device off because it is uncomfortable at 2.8V which was what his functional threshold was prior.    Prior CV studies:   The following studies were reviewed today:  [employer], Inspire titration  Past Medical History:  Diagnosis Date  . Aortic aneurysm ([place]) Endograf 2009  . Arthritis   . Atrial fibrillation ([place])   . Bradycardia   . CAD (coronary artery  disease) CABG 03/2000   Median sternotomy for coronary artery bypass grafting x 3 (left  . HTN (hypertension)   . Hx of CABG 2001   severe 3 vessel disease  . Hx of colonic polyp 06/2010   Hyperplastic   . Hyperlipidemia   . Myocardial infarction ([place])   . Sleep apnea    Past Surgical History:  Procedure Laterality Date  . ATRIAL FIBRILLATION ABLATION N/A 03/19/2020   Procedure: ATRIAL FIBRILLATION ABLATION;  Surgeon: Constance Haw, MD;  Location: [place] CV LAB;  Service: Cardiovascular;  Laterality: N/A;  . CARDIOVERSION N/A 11/28/2018   Procedure: CARDIOVERSION;  Surgeon: Elouise Munroe, MD;  Location: [facility] ENDOSCOPY;  Service: Cardiovascular;  Laterality: N/A;  . CARDIOVERSION N/A 01/13/2021   Procedure: CARDIOVERSION;  Surgeon: Pixie Casino, MD;  [facility];  Service: Cardiovascular;  Laterality: N/A;  . COLONOSCOPY    . CORONARY ARTERY BYPASS GRAFT     AAA stent graft on 12/01/2007 at [facility]  coronary bypass in 2001. Dr. Roxy Manns.  Marland Kitchen DRUG INDUCED ENDOSCOPY Right 02/18/2020   Procedure: DRUG INDUCED ENDOSCOPY;  Surgeon: Melida Quitter, MD;  Location: [place];  Service: ENT;  Laterality: Right;  . FOOT SURGERY Left 07/19/2017  . IMPLANTATION OF HYPOGLOSSAL NERVE STIMULATOR Right 07/30/2020   Procedure: IMPLANTATION  OF HYPOGLOSSAL NERVE STIMULATOR Implantation of Chest wall respirator Sensor Electrode Electronic Analysis of Implanted Neurostimulator Pulse Generator;  Surgeon: Melida Quitter, MD;  Location: [place];  Service: ENT;  Laterality: Right;  . LEFT HEART CATHETERIZATION WITH CORONARY ANGIOGRAM N/A 10/26/2013   Procedure: LEFT HEART CATHETERIZATION WITH CORONARY ANGIOGRAM;  Surgeon: Peter M Martinique, MD;  Location: [facility] CATH LAB;  Service: Cardiovascular;  Laterality: N/A;  . MASS EXCISION Right 08/10/2019   Procedure: EXCISION OF CYST FROM RIGHT SCALP;  Surgeon: Jovita Kussmaul, MD;  Location: [place];  Service: General;  Laterality: Right;     No outpatient medications have been marked as taking for the 01/27/21 encounter (Office Visit) with Sueanne Margarita, MD.     Allergies:   Statins   Social History   Tobacco Use  . Smoking status: Former Smoker    Packs/day: 2.00    Years: 30.00    Pack years: 60.00    Types: Cigarettes    Quit date: 01/05/1992    Years since quitting: 29.0  . Smokeless tobacco: Never Used  . Tobacco comment: quit 19 yrs ago  Vaping Use  . Vaping Use: Never used  Substance Use Topics  . Alcohol use: No  . Drug use: No     Family Hx: The patient's family history includes Heart attack in his father and mother; Heart disease in his father and mother. There is no history of Rectal cancer, Stomach cancer, Pancreatic cancer, Esophageal cancer, Colon polyps, or Colon cancer.  ROS:   Please see the history of present illness.     All other systems reviewed and are negative.   Labs/Other Tests and Data Reviewed:    Recent Labs: 04/15/2020: TSH 1.233 12/25/2020: ALT 13; BUN 23; Creatinine, Ser 1.18; Hemoglobin 12.4; Platelets 217.0; Potassium 4.5; Sodium 142   Recent Lipid Panel Lab Results  Component Value Date/Time   CHOL 194 11/04/2020 07:55 AM   TRIG 114 11/04/2020 07:55 AM   HDL 32 (L) 11/04/2020 07:55 AM   CHOLHDL 6.1 (H) 11/04/2020 07:55 AM   CHOLHDL 4.8 11/29/2018 02:23 AM   LDLCALC 141 (H) 11/04/2020 07:55 AM    Wt Readings from Last 3 Encounters:  01/27/21 206 lb (93.4 kg)  01/20/21 208 lb 3.2 oz (94.4 kg)  01/07/21 212 lb (96.2 kg)     Objective:    Vital Signs:  BP 110/65   Pulse 71   Ht 5\' 10"  (1.778 m)   Wt 206 lb (93.4 kg)   SpO2 93%   BMI 29.56 kg/m    GEN: Well nourished, well developed in no acute distress HEENT: Normal NECK: No JVD; No carotid bruits LYMPHATICS: No lymphadenopathy CARDIAC:RRR, no murmurs, rubs, gallops RESPIRATORY:  Clear to auscultation without rales, wheezing or rhonchi  ABDOMEN:  Soft, non-tender, non-distended MUSCULOSKELETAL:  No edema; No deformity  SKIN: Warm and dry NEUROLOGIC:  Alert and oriented x 3 PSYCHIATRIC:  Normal affect    ASSESSMENT & PLAN:    1.  OSA -he has had a sleep study in 2018 and was tried on CPAP but he did not tolerate the PAP both in 2018 and on retry in 2021. -the oral device was cost prohibitive due to needing major dental work prior to getting the device so that is not an option -now s/p hypoglossal N stimulator -Initial parameters were set as follows:  Amplitude 2.3V  Patient control 2.3-3.3V  Pulse width 28mcs  Rate 33Hz   Start delay 30  min  Pause time 15 min  Therapy duration 8 hours  Electrodes +/-/+ -he had a titration in sleep lab done in March 2022 and titration was inadequate due to ongoing respiratory events at peak Voltage of 3.6V.   -his device was interrogated in the office today -it appears that his functional threshold has significantly decreased to 1.3V likely related to healing around the nerve where the lead was implanted. -the device was reprogrammed to a range of 1.7 to 2.7V -the patient was instructed to increase the level by 1 each week for 4 weeks and then we will plan a virtual check in visit -if he is doing well at his checkin visit then will plan a home sleep study on the highest level he has achieved and is comfortable with.   -his start delay was also extended to 1 hour to help with getting to sleep  2.  HTN -Bp controlled on exam today -continue Hydralazine 25mg  BID, Ramipril 20mg  daily and Imdur 30mg  daily.   Medication Adjustments/Labs and Tests Ordered: Current medicines are reviewed at length with the patient today.  Concerns regarding medicines are outlined above.  Tests Ordered: No orders of the defined types were placed in this encounter.  Medication Changes: No orders of the defined types were placed in this encounter.   Disposition: Set up for in lab sleep study with Inspire  titration  Signed, Fransico Him, MD  01/27/2021 11:33 AM    [place]

## 2021-02-02 ENCOUNTER — Telehealth: Payer: Self-pay | Admitting: Pharmacist

## 2021-02-02 NOTE — Telephone Encounter (Signed)
Called pt to follow up with Repatha tolerability (see phone note 4/11 for more details). He gave a dose on 4/17 and felt just fine, sounds like his prior symptoms of muscle pain were residual after his cardioversion as he's tolerated his other Repatha doses without issue. His insurance is requesting a new authorization. Pt needs follow up labs checked before insurance will reapprove med. Scheduled pt for this Wednesday for lipids and will resubmit PA at that time.

## 2021-02-04 ENCOUNTER — Other Ambulatory Visit: Payer: Self-pay

## 2021-02-04 ENCOUNTER — Other Ambulatory Visit: Payer: PPO | Admitting: *Deleted

## 2021-02-04 DIAGNOSIS — E785 Hyperlipidemia, unspecified: Secondary | ICD-10-CM

## 2021-02-04 LAB — HEPATIC FUNCTION PANEL
ALT: 13 IU/L (ref 0–44)
AST: 16 IU/L (ref 0–40)
Albumin: 4.6 g/dL (ref 3.8–4.8)
Alkaline Phosphatase: 64 IU/L (ref 44–121)
Bilirubin Total: 0.5 mg/dL (ref 0.0–1.2)
Bilirubin, Direct: 0.17 mg/dL (ref 0.00–0.40)
Total Protein: 6.9 g/dL (ref 6.0–8.5)

## 2021-02-04 LAB — LIPID PANEL
Chol/HDL Ratio: 3.2 ratio (ref 0.0–5.0)
Cholesterol, Total: 109 mg/dL (ref 100–199)
HDL: 34 mg/dL — ABNORMAL LOW (ref >39)
LDL Chol Calc (NIH): 59 mg/dL (ref 0–99)
Triglycerides: 78 mg/dL (ref 0–149)
VLDL Cholesterol Cal: 16 mg/dL (ref 5–40)

## 2021-02-05 ENCOUNTER — Ambulatory Visit: Payer: PPO | Admitting: Cardiology

## 2021-02-13 ENCOUNTER — Other Ambulatory Visit: Payer: Self-pay | Admitting: Internal Medicine

## 2021-02-13 NOTE — Telephone Encounter (Signed)
Due for physical please have someone call to schedule

## 2021-02-25 ENCOUNTER — Other Ambulatory Visit: Payer: PPO

## 2021-03-03 DIAGNOSIS — H524 Presbyopia: Secondary | ICD-10-CM | POA: Diagnosis not present

## 2021-03-03 DIAGNOSIS — H5203 Hypermetropia, bilateral: Secondary | ICD-10-CM | POA: Diagnosis not present

## 2021-03-03 DIAGNOSIS — H2513 Age-related nuclear cataract, bilateral: Secondary | ICD-10-CM | POA: Diagnosis not present

## 2021-03-05 ENCOUNTER — Telehealth: Payer: PPO | Admitting: Cardiology

## 2021-03-05 ENCOUNTER — Encounter: Payer: Self-pay | Admitting: Cardiology

## 2021-03-05 ENCOUNTER — Other Ambulatory Visit: Payer: Self-pay

## 2021-03-05 ENCOUNTER — Telehealth: Payer: Self-pay | Admitting: *Deleted

## 2021-03-05 VITALS — BP 139/83 | HR 66 | Ht 70.0 in | Wt 203.0 lb

## 2021-03-05 DIAGNOSIS — I10 Essential (primary) hypertension: Secondary | ICD-10-CM | POA: Diagnosis not present

## 2021-03-05 DIAGNOSIS — G4733 Obstructive sleep apnea (adult) (pediatric): Secondary | ICD-10-CM | POA: Diagnosis not present

## 2021-03-05 NOTE — Patient Instructions (Signed)
Medication Instructions:  Your physician recommends that you continue on your current medications as directed. Please refer to the Current Medication list given to you today.  *If you need a refill on your cardiac medications before your next appointment, please call your pharmacy*  Testing/Procedures: Your physician has recommended that you have a sleep study. This test records several body functions during sleep, including: brain activity, eye movement, oxygen and carbon dioxide blood levels, heart rate and rhythm, breathing rate and rhythm, the flow of air through your mouth and nose, snoring, body muscle movements, and chest and belly movement.  Follow-Up: At [facility], you and your health needs are our priority.  As part of our continuing mission to provide you with exceptional heart care, we have created designated Provider Care Teams.  These Care Teams include your primary Cardiologist (physician) and Advanced Practice Providers (APPs -  Physician Assistants and Nurse Practitioners) who all work together to provide you with the care you need, when you need it.  Follow up with Dr. Radford Pax after sleep study.

## 2021-03-05 NOTE — Telephone Encounter (Signed)
-----   Message from Antonieta Iba, RN sent at 03/05/2021  8:24 AM EDT ----- Home sleep study has been ordered.  Thanks!

## 2021-03-05 NOTE — Progress Notes (Addendum)
Virtual Visit via Video Note   This visit type was conducted due to national recommendations for restrictions regarding the COVID-19 Pandemic (e.g. social distancing) in an effort to limit this patient's exposure and mitigate transmission in our community.  Due to his co-morbid illnesses, this patient is at least at moderate risk for complications without adequate follow up.  This format is felt to be most appropriate for this patient at this time.  All issues noted in this document were discussed and addressed.  A limited physical exam was performed with this format.  Please refer to the patient's chart for his consent to telehealth for [facility].    Date:  03/18/2021   ID:  [name] Brown, DOB Oct 25, 1950, MRN [id number] The patient was identified using 2 identifiers.  Patient Location: Home Provider Location: Office/Clinic  PCP:  Hoyt Koch, MD  Cardiologist:  Jenkins Rouge, MD  Sleep Medicine:  Leanora Cover, MD Electrophysiologist:  Will Meredith Leeds, MD   Chief Complaint:  OSA  History of Present Illness:    [name] Brown is a 70 y.o. male with a hx of afib, SSS, CAD.  He has a hx of OSA with home sleep study documenting this in 2018.  He was placed on CPAP but at that time did not tolerate CPAP therapy.  He was using a nasal mask or nasal pillow mask but it bothered him so much he could not use it.  He was referred to Dr. Ron Parker for an oral device but apparently needed implants put in because he does not have enough teeth but it was cost prohibitive.   He decided to retry CPAP and underwent CPAP tiration in 08/2019.  Due to ongoing respiratory events he could not be adequately titrated on CPAP and was changed to BiPAP.  He again could not tolerate BiPAP.   Due to problems with excessive daytime sleepiness he was referred to ENT to be evaluated for left hypoglossal nerve stimulator.    He underwent Hypoglossal N stimulator placement 07/30/2020 and underwent  device activation. He  was seen back in office and was doing well with his device and was at level 6.  He underwent titration in the sleep lab but was inadequate due to ongoing respiratory events.  He was seen back in clinic 4 weeks ago and his functional threshold had decreased to 1.3V and his range was changed to 1.7-2.7.  He is now back for followup.   He is doing well with his device and using it nightly. He is now on level 6-7 which is 2.3V.  He feels very rested in the am but does nap during the day but has no problems with excessive sleepiness during the day.  For the most part he has no discomfort in his tongue.  He wakes up to use the restroom at night and sometimes pauses it and sometimes he doesn't pause it and goes back to sleep.  He thinks that the hour onset delay when he goes to bed is good.    Prior CV studies:   The following studies were reviewed today:   Inspire titration  Past Medical History:  Diagnosis Date  . Aortic aneurysm ([place]) Endograf 2009  . Arthritis   . Atrial fibrillation ([place])   . Bradycardia   . CAD (coronary artery disease) CABG 03/2000   Median sternotomy for coronary artery bypass grafting x 3 (left  . HTN (hypertension)   . Hx of CABG 2001  severe 3 vessel disease  . Hx of colonic polyp 06/2010   Hyperplastic   . Hyperlipidemia   . Myocardial infarction ([place])   . Sleep apnea    Past Surgical History:  Procedure Laterality Date  . ATRIAL FIBRILLATION ABLATION N/A 03/19/2020   Procedure: ATRIAL FIBRILLATION ABLATION;  Surgeon: Constance Haw, MD;  Location: [place] CV LAB;  Service: Cardiovascular;  Laterality: N/A;  . CARDIOVERSION N/A 11/28/2018   Procedure: CARDIOVERSION;  Surgeon: Elouise Munroe, MD;  Location: [facility] ENDOSCOPY;  Service: Cardiovascular;  Laterality: N/A;  . CARDIOVERSION N/A 01/13/2021   Procedure: CARDIOVERSION;  Surgeon: Pixie Casino, MD;  Location: [facility] ENDOSCOPY;  Service: Cardiovascular;  Laterality: N/A;  .  COLONOSCOPY    . CORONARY ARTERY BYPASS GRAFT     AAA stent graft on 12/01/2007 at [facility]  coronary bypass in 2001. Dr. Roxy Manns.  Marland Kitchen DRUG INDUCED ENDOSCOPY Right 02/18/2020   Procedure: DRUG INDUCED ENDOSCOPY;  Surgeon: Melida Quitter, MD;  Location: [place];  Service: ENT;  Laterality: Right;  . FOOT SURGERY Left 07/19/2017  . IMPLANTATION OF HYPOGLOSSAL NERVE STIMULATOR Right 07/30/2020   Procedure: IMPLANTATION OF HYPOGLOSSAL NERVE STIMULATOR Implantation of Chest wall respirator Sensor Electrode Electronic Analysis of Implanted Neurostimulator Pulse Generator;  Surgeon: Melida Quitter, MD;  Location: [place];  Service: ENT;  Laterality: Right;  . LEFT HEART CATHETERIZATION WITH CORONARY ANGIOGRAM N/A 10/26/2013   Procedure: LEFT HEART CATHETERIZATION WITH CORONARY ANGIOGRAM;  Surgeon: Peter M Martinique, MD;  Location: [facility] CATH LAB;  Service: Cardiovascular;  Laterality: N/A;  . MASS EXCISION Right 08/10/2019   Procedure: EXCISION OF CYST FROM RIGHT SCALP;  Surgeon: Autumn Messing III, MD;  Location: [place];  Service: General;  Laterality: Right;     Current Meds  Medication Sig  . albuterol (VENTOLIN HFA) 108 (90 Base) MCG/ACT inhaler Inhale 2 puffs into the lungs every 6 (six) hours as needed for wheezing or shortness of breath.  Marland Kitchen apixaban (ELIQUIS) 5 MG TABS tablet Take 1 tablet (5 mg total) by mouth 2 (two) times daily.  Marland Kitchen aspirin EC 81 MG EC tablet Take 1 tablet (81 mg total) by mouth daily.  . Biotin 10000 MCG TABS Take 10,000 mcg by mouth in the morning.  . cholecalciferol (VITAMIN D) 25 MCG (1000 UT) tablet Take 1,000 Units by mouth every evening.  . diclofenac sodium (VOLTAREN) 1 % GEL Apply 2 g topically 4 (four) times daily.  . divalproex (DEPAKOTE ER) 500 MG 24 hr tablet TAKE 1 TABLET(500 MG) BY MOUTH DAILY  . doxycycline (VIBRAMYCIN) 50 MG capsule Take 50 mg by mouth every evening.  . Evolocumab (REPATHA  SURECLICK) 299 MG/ML SOAJ Inject 1 pen into the skin every 14 (fourteen) days.  Marland Kitchen ezetimibe (ZETIA) 10 MG tablet Take 1 tablet (10 mg total) by mouth daily.  . furosemide (LASIX) 20 MG tablet TAKE 1 TABLET(20 MG) BY MOUTH TWICE DAILY  . gabapentin (NEURONTIN) 300 MG capsule TAKE 1 CAPSULE(300 MG) BY MOUTH TWICE DAILY  . Glucosamine-Chondroitin (GLUCOSAMINE CHONDR COMPLEX PO) Take 500 mg by mouth 2 (two) times daily.   . hydrALAZINE (APRESOLINE) 25 MG tablet Take 1 tablet (25 mg total) by mouth in the morning and at bedtime.  . isosorbide mononitrate (IMDUR) 30 MG 24 hr tablet TAKE 1 TABLET BY MOUTH EVERY DAY  . montelukast (SINGULAIR) 10 MG tablet Take 1 tablet (10 mg total) by mouth at bedtime.  . Multiple Vitamin (  MULTIVITAMIN WITH MINERALS) TABS tablet Take 1 tablet by mouth every evening.  . nitroGLYCERIN (NITROSTAT) 0.4 MG SL tablet DISSOLVE 1 TABLET UNDER THE TONGUE EVERY 5 MINUTES FOR 3 DOSES AS NEEDED FOR CHEST PAIN  . Omega-3 Fatty Acids (FISH OIL) 1200 MG CAPS Take 1,200 mg by mouth 2 (two) times daily.  . pantoprazole (PROTONIX) 40 MG tablet Take 1 tablet (40 mg total) by mouth daily.  . potassium chloride SA (KLOR-CON) 20 MEQ tablet TAKE 1 TABLET BY MOUTH EVERY DAY  . ramipril (ALTACE) 10 MG capsule Take 2 capsules (20 mg total) by mouth daily.  . tamsulosin (FLOMAX) 0.4 MG CAPS capsule TAKE ONE CAPSULE BY MOUTH DAILY  . tiZANidine (ZANAFLEX) 4 MG tablet Take 1 tablet (4 mg total) by mouth every 8 (eight) hours as needed for muscle spasms.  . vitamin C (ASCORBIC ACID) 500 MG tablet Take 500 mg by mouth in the morning.  . zolpidem (AMBIEN CR) 12.5 MG CR tablet TAKE 1 TABLET(12.5 MG) BY MOUTH AT BEDTIME AS NEEDED FOR SLEEP     Allergies:   Statins   Social History   Tobacco Use  . Smoking status: Former Smoker    Packs/day: 2.00    Years: 30.00    Pack years: 60.00    Types: Cigarettes    Quit date: 01/05/1992    Years since quitting: 29.1  . Smokeless tobacco: Never Used  .  Tobacco comment: quit 19 yrs ago  Vaping Use  . Vaping Use: Never used  Substance Use Topics  . Alcohol use: No  . Drug use: No     Family Hx: The patient's family history includes Heart attack in his father and mother; Heart disease in his father and mother. There is no history of Rectal cancer, Stomach cancer, Pancreatic cancer, Esophageal cancer, Colon polyps, or Colon cancer.  ROS:   Please see the history of present illness.     All other systems reviewed and are negative.   Labs/Other Tests and Data Reviewed:    Recent Labs: 04/15/2020: TSH 1.233 12/25/2020: BUN 23; Creatinine, Ser 1.18; Hemoglobin 12.4; Platelets 217.0; Potassium 4.5; Sodium 142 02/04/2021: ALT 13   Recent Lipid Panel Lab Results  Component Value Date/Time   CHOL 109 02/04/2021 07:47 AM   TRIG 78 02/04/2021 07:47 AM   HDL 34 (L) 02/04/2021 07:47 AM   CHOLHDL 3.2 02/04/2021 07:47 AM   CHOLHDL 4.8 11/29/2018 02:23 AM   LDLCALC 59 02/04/2021 07:47 AM    Wt Readings from Last 3 Encounters:  03/05/21 203 lb (92.1 kg)  01/27/21 206 lb (93.4 kg)  01/20/21 208 lb 3.2 oz (94.4 kg)     Objective:    Vital Signs:  BP 139/83   Pulse 66   Ht 5\' 10"  (1.778 m)   Wt 203 lb (92.1 kg)   BMI 29.13 kg/m    Well nourished, well developed male in no acute distress. Well appearing, alert and conversant, regular work of breathing,  good skin color  Eyes- anicteric mouth- oral mucosa is pink  neuro- grossly intact skin- no apparent rash or lesions or cyanosis   ASSESSMENT & PLAN:    1.  OSA -he has had a sleep study in 2018 and was tried on CPAP but he did not tolerate the PAP both in 2018 and on retry in 2021. -the oral device was cost prohibitive due to needing major dental work prior to getting the device so that is not an option -now s/p hypoglossal  N stimulator -Initial parameters were set as follows:  Amplitude 2.3V  Patient control 2.3-3.3V  Pulse width 37mcs  Rate 33Hz   Start delay 30  min  Pause time 15 min  Therapy duration 8 hours  Electrodes +/-/+ -he had a titration in sleep lab done in March 2022 and titration was inadequate due to ongoing respiratory events at peak Voltage of 3.6V.   -his device was interrogated in office 4 weeks ago and his functional threshold had significantly decreased to 1.3V likely related to healing around the nerve where the lead was implanted and the device was reprogrammed to a range of 1.7 to 2.7V -the patient was instructed to increase the level by 1 each week for 4 weeks and then we will plan a virtual check in visit -he is doing well today with his device and is now on level 3 -he is doing wonderful with his current settings and is on level 6-7 which is 2.3V (range currently is 1.7-2.7V) -I will order a home sleep study to see how his apneas are on current settings and then have followup after that  2.  HTN -his BP is controlled on exam today -Continue prescription drug management with Hydralazine 25mg  BID, Ramipril 20mg  daily and Imdur 30mg  daily -I have personally reviewed and interpreted outside labs performed by patient's PCP which showed SCr 1.18, K+ 4.5 and Hbg 12.4 in March 2022   COVID-19 Education: The signs and symptoms of COVID-19 were discussed with the patient and how to seek care for testing (follow up with PCP or arrange [id number]).  The importance of social distancing was discussed today.  Time:   Today, I have spent 10 minutes with the patient with telehealth technology discussing the above problems as well as another 10 minutes reviewing his labs from PCP in March and prior sleep study and Inspire titration.   Medication Adjustments/Labs and Tests Ordered: Current medicines are reviewed at length with the patient today.  Concerns regarding medicines are outlined above.  Tests Ordered: No orders of the defined types were placed in this encounter.  Medication Changes: No orders of the defined types were placed in  this encounter.   Disposition: Set up for in lab sleep study with Inspire titration  Signed, Fransico Him, MD  03/05/2021 8:12 AM    [place]

## 2021-03-05 NOTE — Addendum Note (Signed)
Addended by: Antonieta Iba on: 03/05/2021 08:24 AM   Modules accepted: Orders

## 2021-03-06 ENCOUNTER — Telehealth: Payer: Self-pay | Admitting: *Deleted

## 2021-03-06 NOTE — Telephone Encounter (Signed)
PA is not required for HST by patient's insurance. Will route to [facility] for scheduling.

## 2021-03-06 NOTE — Telephone Encounter (Signed)
-----   Message from Antonieta Iba, RN sent at 03/05/2021  8:24 AM EDT ----- Home sleep study has been ordered.  Thanks!

## 2021-03-17 ENCOUNTER — Other Ambulatory Visit: Payer: Self-pay | Admitting: Internal Medicine

## 2021-03-24 ENCOUNTER — Telehealth: Payer: Self-pay

## 2021-03-24 NOTE — Telephone Encounter (Signed)
Spoke with pt regarding an appt for his sleep study. Pt was informed that his appt will be on 05/05/21 at 11am. Pt agreed and confirmed appt.

## 2021-03-25 NOTE — Progress Notes (Signed)
Electrophysiology Office Note   Date:  03/26/2021   ID:  [name] Brown, DOB 1951-06-16, MRN [id number]  PCP:  Hoyt Koch, MD  Cardiologist:  Johnsie Cancel Primary Electrophysiologist:  Annabelle Rexroad Meredith Leeds, MD    No chief complaint on file.    History of Present Illness: [name] Brown is a 70 y.o. male who is being seen today for the evaluation of atrial fibrillation at the request of Hoyt Koch, *. Presenting today for electrophysiology evaluation.    He has a history significant for coronary artery disease status post three-vessel CABG, hypertension, hyperlipidemia, and atrial fibrillation.  He was found to be in atrial fibrillation incidentally 08/30/2017.  He was put on Eliquis at the time.  He was not started on beta-blockers due to resting bradycardia.  He is status post AF ablation 03/19/2020.  Today, denies symptoms of palpitations, chest pain, orthopnea, PND, lower extremity edema, claudication, dizziness, presyncope, syncope, bleeding, or neurologic sequela. The patient is tolerating medications without difficulties.  Since last being seen he unfortunately required cardioversion on 01/13/2021.  Despite that, he has felt well.  He has had some mild shortness of breath with exertion, though overall has no major complaints.  Past Medical History:  Diagnosis Date   Aortic aneurysm [facility]) Endograf 2009   Arthritis    Atrial fibrillation (HCC)    Bradycardia    CAD (coronary artery disease) CABG 03/2000   Median sternotomy for coronary artery bypass grafting x 3 (left   HTN (hypertension)    Hx of CABG 2001   severe 3 vessel disease   Hx of colonic polyp 06/2010   Hyperplastic    Hyperlipidemia    Myocardial infarction [facility])    Sleep apnea    Past Surgical History:  Procedure Laterality Date   ATRIAL FIBRILLATION ABLATION N/A 03/19/2020   Procedure: ATRIAL FIBRILLATION ABLATION;  Surgeon: Constance Haw, MD;  Location: [place] CV LAB;  Service:  Cardiovascular;  Laterality: N/A;   CARDIOVERSION N/A 11/28/2018   Procedure: CARDIOVERSION;  Surgeon: Elouise Munroe, MD;  Location: [facility] ENDOSCOPY;  Service: Cardiovascular;  Laterality: N/A;   CARDIOVERSION N/A 01/13/2021   Procedure: CARDIOVERSION;  Surgeon: Pixie Casino, MD;  Location: [facility] ENDOSCOPY;  Service: Cardiovascular;  Laterality: N/A;   COLONOSCOPY     CORONARY ARTERY BYPASS GRAFT     AAA stent graft on 12/01/2007 at [facility]  coronary bypass in 2001. Dr. Roxy Manns.   DRUG INDUCED ENDOSCOPY Right 02/18/2020   Procedure: DRUG INDUCED ENDOSCOPY;  Surgeon: Melida Quitter, MD;  Location: [place];  Service: ENT;  Laterality: Right;   FOOT SURGERY Left 07/19/2017   IMPLANTATION OF HYPOGLOSSAL NERVE STIMULATOR Right 07/30/2020   Procedure: IMPLANTATION OF HYPOGLOSSAL NERVE STIMULATOR Implantation of Chest wall respirator Sensor Electrode Electronic Analysis of Implanted Neurostimulator Pulse Generator;  Surgeon: Melida Quitter, MD;  Location: [place];  Service: ENT;  Laterality: Right;   LEFT HEART CATHETERIZATION WITH CORONARY ANGIOGRAM N/A 10/26/2013   Procedure: LEFT HEART CATHETERIZATION WITH CORONARY ANGIOGRAM;  Surgeon: Peter M Martinique, MD;  Location: [facility] CATH LAB;  Service: Cardiovascular;  Laterality: N/A;   MASS EXCISION Right 08/10/2019   Procedure: EXCISION OF CYST FROM RIGHT SCALP;  Surgeon: Jovita Kussmaul, MD;  Location: [place];  Service: General;  Laterality: Right;     Current Outpatient Medications  Medication Sig Dispense Refill   albuterol (VENTOLIN HFA) 108 (90 Base) MCG/ACT inhaler Inhale 2  puffs into the lungs every 6 (six) hours as needed for wheezing or shortness of breath. 8 g 0   apixaban (ELIQUIS) 5 MG TABS tablet Take 1 tablet (5 mg total) by mouth 2 (two) times daily. 180 tablet 2   aspirin EC 81 MG EC tablet Take 1 tablet (81 mg total) by mouth daily.     Biotin 10000 MCG TABS Take 10,000  mcg by mouth in the morning.     cholecalciferol (VITAMIN D) 25 MCG (1000 UT) tablet Take 1,000 Units by mouth every evening.     diclofenac sodium (VOLTAREN) 1 % GEL Apply 2 g topically 4 (four) times daily. 100 g 3   divalproex (DEPAKOTE ER) 500 MG 24 hr tablet TAKE 1 TABLET(500 MG) BY MOUTH DAILY 90 tablet 1   doxycycline (VIBRAMYCIN) 50 MG capsule Take 50 mg by mouth every evening.     Evolocumab (REPATHA SURECLICK) 979 MG/ML SOAJ Inject 1 pen into the skin every 14 (fourteen) days. 6 mL 3   ezetimibe (ZETIA) 10 MG tablet Take 1 tablet (10 mg total) by mouth daily. 90 tablet 3   furosemide (LASIX) 20 MG tablet TAKE 1 TABLET(20 MG) BY MOUTH TWICE DAILY 180 tablet 3   gabapentin (NEURONTIN) 300 MG capsule TAKE 1 CAPSULE(300 MG) BY MOUTH TWICE DAILY 180 capsule 2   Glucosamine-Chondroitin (GLUCOSAMINE CHONDR COMPLEX PO) Take 500 mg by mouth 2 (two) times daily.      hydrALAZINE (APRESOLINE) 25 MG tablet Take 1 tablet (25 mg total) by mouth in the morning and at bedtime. 270 tablet 2   isosorbide mononitrate (IMDUR) 30 MG 24 hr tablet TAKE 1 TABLET BY MOUTH EVERY DAY 90 tablet 2   montelukast (SINGULAIR) 10 MG tablet Take 1 tablet (10 mg total) by mouth at bedtime. 90 tablet 3   Multiple Vitamin (MULTIVITAMIN WITH MINERALS) TABS tablet Take 1 tablet by mouth every evening.     nitroGLYCERIN (NITROSTAT) 0.4 MG SL tablet DISSOLVE 1 TABLET UNDER THE TONGUE EVERY 5 MINUTES FOR 3 DOSES AS NEEDED FOR CHEST PAIN 25 tablet 4   Omega-3 Fatty Acids (FISH OIL) 1200 MG CAPS Take 1,200 mg by mouth 2 (two) times daily.     pantoprazole (PROTONIX) 40 MG tablet Take 1 tablet (40 mg total) by mouth daily. 30 tablet 3   potassium chloride SA (KLOR-CON) 20 MEQ tablet TAKE 1 TABLET BY MOUTH EVERY DAY 90 tablet 2   ramipril (ALTACE) 10 MG capsule Take 2 capsules (20 mg total) by mouth daily. 180 capsule 3   tamsulosin (FLOMAX) 0.4 MG CAPS capsule TAKE ONE CAPSULE BY MOUTH DAILY 90 capsule 3   tiZANidine (ZANAFLEX) 4  MG tablet Take 1 tablet (4 mg total) by mouth every 8 (eight) hours as needed for muscle spasms. 270 tablet 3   zolpidem (AMBIEN CR) 12.5 MG CR tablet TAKE 1 TABLET(12.5 MG) BY MOUTH AT BEDTIME AS NEEDED FOR SLEEP 30 tablet 5   No current facility-administered medications for this visit.    Allergies:   Statins   Social History:  The patient  reports that he quit smoking about 29 years ago. His smoking use included cigarettes. He has a 60.00 pack-year smoking history. He has never used smokeless tobacco. He reports that he does not drink alcohol and does not use drugs.   Family History:  The patient's family history includes Heart attack in his father and mother; Heart disease in his father and mother.   ROS:  Please see the history  of present illness.   Otherwise, review of systems is positive for none.   All other systems are reviewed and negative.   PHYSICAL EXAM: VS:  BP 104/60   Pulse 68   Ht 5\' 10"  (1.778 m)   Wt 205 lb 12.8 oz (93.4 kg)   SpO2 92%   BMI 29.53 kg/m  , BMI Body mass index is 29.53 kg/m. GEN: Well nourished, well developed, in no acute distress  HEENT: normal  Neck: no JVD, carotid bruits, or masses Cardiac: RRR; no murmurs, rubs, or gallops,no edema  Respiratory:  clear to auscultation bilaterally, normal work of breathing GI: soft, nontender, nondistended, + BS MS: no deformity or atrophy  Skin: warm and dry Neuro:  Strength and sensation are intact Psych: euthymic mood, full affect  EKG:  EKG is ordered today. Personal review of the ekg ordered shows sinus rhythm, rate 68  Recent Labs: 04/15/2020: TSH 1.233 12/25/2020: BUN 23; Creatinine, Ser 1.18; Hemoglobin 12.4; Platelets 217.0; Potassium 4.5; Sodium 142 02/04/2021: ALT 13    Lipid Panel     Component Value Date/Time   CHOL 109 02/04/2021 0747   TRIG 78 02/04/2021 0747   HDL 34 (L) 02/04/2021 0747   CHOLHDL 3.2 02/04/2021 0747   CHOLHDL 4.8 11/29/2018 0223   VLDL 6 11/29/2018 0223   LDLCALC  59 02/04/2021 0747     Wt Readings from Last 3 Encounters:  03/26/21 205 lb 12.8 oz (93.4 kg)  03/05/21 203 lb (92.1 kg)  01/27/21 206 lb (93.4 kg)      Other studies Reviewed: Additional studies/ records that were reviewed today include: TTE 01/21/20 Review of the above records today demonstrates:   1. Left ventricular ejection fraction, by estimation, is 55 to 60%. The  left ventricle has normal function. Left ventricular endocardial border  not optimally defined to evaluate regional wall motion. There is mild left  ventricular hypertrophy. Left  ventricular diastolic parameters are consistent with Grade II diastolic  dysfunction (pseudonormalization).   2. Right ventricular systolic function is normal. The right ventricular  size is mildly enlarged. There is mildly elevated pulmonary artery  systolic pressure.   3. Left atrial size was moderately dilated.   4. Right atrial size was mildly dilated.   5. The mitral valve is normal in structure. Mild mitral valve  regurgitation.   6. The aortic valve is tricuspid. Aortic valve regurgitation is mild.  Mild aortic valve sclerosis is present, with no evidence of aortic valve  stenosis.   7. The inferior vena cava is normal in size with greater than 50%  respiratory variability, suggesting right atrial pressure of 3 mmHg.   SPECT 11/08/18 The left ventricular ejection fraction is normal (55-65%). Nuclear stress EF: 56%. Post CABG septal wall hypokinesis. There was no ST segment deviation noted during stress. There is mildly reduced radiotracer uptake at both rest and stress in the anterior, lateral and inferolateral distributions. No ischemia identified. (POST CABG) This is a low risk study.  ASSESSMENT AND PLAN:  1.  Persistent atrial fibrillation: Currently on Eliquis with a CHA2DS2-VASc of 3.  He was taken off of amiodarone as he had ablation 03/19/2020.  He has required cardioversion since his ablation, but overall has done  well.  He is status post cardioversion 01/13/2021.  He is happy with his overall control.  We Emeri Estill continue with current management.  2.  Coronary artery disease: Status post CABG.  Has occluded radial to the OM.  No current chest pain.  3.  Hypertension: Currently well controlled  4.  Hyperlipidemia: Not on a statin due to intolerance with myalgias.  Is currently on Repatha.  5.  Obstructive sleep apnea: Currently treated with a nerve stimulator.  He is currently feeling well without complaint.  No changes.  Current medicines are reviewed at length with the patient today.   The patient does not have concerns regarding his medicines.  The following changes were made today: None  Labs/ tests ordered today include:  Orders Placed This Encounter  Procedures   EKG 12-Lead     Disposition:   FU with Joshuah Minella 6 months  Signed, Ahmir Bracken Meredith Leeds, MD  03/26/2021 9:01 AM     [address] [place] [address] [phone] (office) [phone] (fax)

## 2021-03-26 ENCOUNTER — Other Ambulatory Visit: Payer: Self-pay

## 2021-03-26 ENCOUNTER — Encounter: Payer: Self-pay | Admitting: Cardiology

## 2021-03-26 ENCOUNTER — Other Ambulatory Visit: Payer: PPO

## 2021-03-26 ENCOUNTER — Ambulatory Visit: Payer: PPO | Admitting: Cardiology

## 2021-03-26 VITALS — BP 104/60 | HR 68 | Ht 70.0 in | Wt 205.8 lb

## 2021-03-26 DIAGNOSIS — I4819 Other persistent atrial fibrillation: Secondary | ICD-10-CM

## 2021-03-26 DIAGNOSIS — T466X5D Adverse effect of antihyperlipidemic and antiarteriosclerotic drugs, subsequent encounter: Secondary | ICD-10-CM | POA: Diagnosis not present

## 2021-03-26 DIAGNOSIS — G720 Drug-induced myopathy: Secondary | ICD-10-CM

## 2021-03-27 NOTE — Telephone Encounter (Signed)
Pt is aware of testing date.

## 2021-04-08 ENCOUNTER — Other Ambulatory Visit: Payer: Self-pay | Admitting: Internal Medicine

## 2021-04-08 DIAGNOSIS — N401 Enlarged prostate with lower urinary tract symptoms: Secondary | ICD-10-CM

## 2021-04-08 DIAGNOSIS — R3912 Poor urinary stream: Secondary | ICD-10-CM

## 2021-04-14 NOTE — Progress Notes (Signed)
CARDIOLOGY CONSULT NOTE       Patient ID: [name] Brown MRN: [id number] DOB/AGE: 12-22-1950 70 y.o.   Primary Physician: Hoyt Koch, MD Primary Cardiologist: Johnsie Cancel Reason for Consultation: Curt Bears  Active Problems:   * No active hospital problems. *   HPI:  70 y.o. with history of CAD/CABG distant LIMA to LAD, RIMA to PDA left radial to OM1. Cath 2015 with occluded radial to OM native vessel only 30-40% stenosed. Normal myovue 11/08/18.  PVD followed by Dr Doren Custard with EVAR  Duplex 05/10/18 no residual AAA or endoleak. 74-12% LICA stenosis by duplex 01/22/21 TTE done 01/21/20 with EF 60-65% moderate LAE, mild AR/MR.    Afib noted in 2018 CHADVASC 3 on eliquis Afib ablation June 2021 and amiodarone d/c post procedure Required [facility] for recurrence 01/13/21. Seen by Dr Curt Bears 03/26/21 stable no AAT added back   Sees Dr Radford Pax for OSA Intolerant to CPAP/Bipap.  Had hypoglossal N stimulator placement 07/30/20   Getting GERD with eliquis has been on H2 blocker twice with symptoms returning when he stops   ROS All other systems reviewed and negative except as noted above  Past Medical History:  Diagnosis Date   Aortic aneurysm [facility]) Endograf 2009   Arthritis    Atrial fibrillation (HCC)    Bradycardia    CAD (coronary artery disease) CABG 03/2000   Median sternotomy for coronary artery bypass grafting x 3 (left   HTN (hypertension)    Hx of CABG 2001   severe 3 vessel disease   Hx of colonic polyp 06/2010   Hyperplastic    Hyperlipidemia    Myocardial infarction [facility])    Sleep apnea     Family History  Problem Relation Age of Onset   Heart attack Mother    Heart disease Mother    Heart attack Father    Heart disease Father    Rectal cancer Neg Hx    Stomach cancer Neg Hx    Pancreatic cancer Neg Hx    Esophageal cancer Neg Hx    Colon polyps Neg Hx    Colon cancer Neg Hx     Social History   Socioeconomic History   Marital status: Married    Spouse name: Not  on file   Number of children: Not on file   Years of education: Not on file   Highest education level: Not on file  Occupational History   Not on file  Tobacco Use   Smoking status: Former    Packs/day: 2.00    Years: 30.00    Pack years: 60.00    Types: Cigarettes    Quit date: 01/05/1992    Years since quitting: 29.3   Smokeless tobacco: Never   Tobacco comments:    quit 19 yrs ago  Vaping Use   Vaping Use: Never used  Substance and Sexual Activity   Alcohol use: No   Drug use: No   Sexual activity: Not on file  Other Topics Concern   Not on file  Social History Narrative   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   Social Determinants of Health   Financial Resource Strain: Medium Risk   Difficulty of Paying Living Expenses: Somewhat hard  Food Insecurity: Not on file  Transportation Needs: Not on file  Physical Activity: Not on file  Stress: Not on file  Social Connections: Not on file  Intimate Partner Violence: Not on file    Past  Surgical History:  Procedure Laterality Date   ATRIAL FIBRILLATION ABLATION N/A 03/19/2020   Procedure: ATRIAL FIBRILLATION ABLATION;  Surgeon: Constance Haw, MD;  Location: [place] CV LAB;  Service: Cardiovascular;  Laterality: N/A;   CARDIOVERSION N/A 11/28/2018   Procedure: CARDIOVERSION;  Surgeon: Elouise Munroe, MD;  Location: [facility] ENDOSCOPY;  Service: Cardiovascular;  Laterality: N/A;   CARDIOVERSION N/A 01/13/2021   Procedure: CARDIOVERSION;  Surgeon: Pixie Casino, MD;  Location: [facility] ENDOSCOPY;  Service: Cardiovascular;  Laterality: N/A;   COLONOSCOPY     CORONARY ARTERY BYPASS GRAFT     AAA stent graft on 12/01/2007 at [facility]  coronary bypass in 2001. Dr. Roxy Manns.   DRUG INDUCED ENDOSCOPY Right 02/18/2020   Procedure: DRUG INDUCED ENDOSCOPY;  Surgeon: Melida Quitter, MD;  Location: [place];  Service: ENT;  Laterality: Right;   FOOT SURGERY Left  07/19/2017   IMPLANTATION OF HYPOGLOSSAL NERVE STIMULATOR Right 07/30/2020   Procedure: IMPLANTATION OF HYPOGLOSSAL NERVE STIMULATOR Implantation of Chest wall respirator Sensor Electrode Electronic Analysis of Implanted Neurostimulator Pulse Generator;  Surgeon: Melida Quitter, MD;  Location: [place];  Service: ENT;  Laterality: Right;   LEFT HEART CATHETERIZATION WITH CORONARY ANGIOGRAM N/A 10/26/2013   Procedure: LEFT HEART CATHETERIZATION WITH CORONARY ANGIOGRAM;  Surgeon: Zaydon Kinser M Martinique, MD;  Location: [facility] CATH LAB;  Service: Cardiovascular;  Laterality: N/A;   MASS EXCISION Right 08/10/2019   Procedure: EXCISION OF CYST FROM RIGHT SCALP;  Surgeon: Jovita Kussmaul, MD;  Location: [place];  Service: General;  Laterality: Right;      Current Outpatient Medications:    albuterol (VENTOLIN HFA) 108 (90 Base) MCG/ACT inhaler, Inhale 2 puffs into the lungs every 6 (six) hours as needed for wheezing or shortness of breath., Disp: 8 g, Rfl: 0   apixaban (ELIQUIS) 5 MG TABS tablet, Take 1 tablet (5 mg total) by mouth 2 (two) times daily., Disp: 180 tablet, Rfl: 2   aspirin EC 81 MG EC tablet, Take 1 tablet (81 mg total) by mouth daily., Disp: , Rfl:    Biotin 10000 MCG TABS, Take 10,000 mcg by mouth in the morning., Disp: , Rfl:    cholecalciferol (VITAMIN D) 25 MCG (1000 UT) tablet, Take 1,000 Units by mouth every evening., Disp: , Rfl:    diclofenac sodium (VOLTAREN) 1 % GEL, Apply 2 g topically 4 (four) times daily., Disp: 100 g, Rfl: 3   divalproex (DEPAKOTE ER) 500 MG 24 hr tablet, TAKE 1 TABLET(500 MG) BY MOUTH DAILY, Disp: 90 tablet, Rfl: 1   doxycycline (VIBRAMYCIN) 50 MG capsule, Take 50 mg by mouth every evening., Disp: , Rfl:    Evolocumab (REPATHA SURECLICK) 540 MG/ML SOAJ, Inject 1 pen into the skin every 14 (fourteen) days., Disp: 6 mL, Rfl: 3   ezetimibe (ZETIA) 10 MG tablet, Take 1 tablet (10 mg total) by mouth daily., Disp: 90 tablet, Rfl: 3    furosemide (LASIX) 20 MG tablet, TAKE 1 TABLET(20 MG) BY MOUTH TWICE DAILY, Disp: 180 tablet, Rfl: 3   gabapentin (NEURONTIN) 300 MG capsule, TAKE 1 CAPSULE(300 MG) BY MOUTH TWICE DAILY, Disp: 180 capsule, Rfl: 2   Glucosamine-Chondroitin (GLUCOSAMINE CHONDR COMPLEX PO), Take 500 mg by mouth 2 (two) times daily. , Disp: , Rfl:    hydrALAZINE (APRESOLINE) 25 MG tablet, Take 1 tablet (25 mg total) by mouth in the morning and at bedtime., Disp: 270 tablet, Rfl: 2   isosorbide mononitrate (IMDUR) 30  MG 24 hr tablet, TAKE 1 TABLET BY MOUTH EVERY DAY, Disp: 90 tablet, Rfl: 2   montelukast (SINGULAIR) 10 MG tablet, Take 1 tablet (10 mg total) by mouth at bedtime., Disp: 90 tablet, Rfl: 3   Multiple Vitamin (MULTIVITAMIN WITH MINERALS) TABS tablet, Take 1 tablet by mouth every evening., Disp: , Rfl:    nitroGLYCERIN (NITROSTAT) 0.4 MG SL tablet, DISSOLVE 1 TABLET UNDER THE TONGUE EVERY 5 MINUTES FOR 3 DOSES AS NEEDED FOR CHEST PAIN, Disp: 25 tablet, Rfl: 4   Omega-3 Fatty Acids (FISH OIL) 1200 MG CAPS, Take 1,200 mg by mouth 2 (two) times daily., Disp: , Rfl:    pantoprazole (PROTONIX) 40 MG tablet, Take 1 tablet (40 mg total) by mouth daily., Disp: 30 tablet, Rfl: 3   potassium chloride SA (KLOR-CON) 20 MEQ tablet, TAKE 1 TABLET BY MOUTH EVERY DAY, Disp: 90 tablet, Rfl: 2   ramipril (ALTACE) 10 MG capsule, Take 2 capsules (20 mg total) by mouth daily., Disp: 180 capsule, Rfl: 3   tamsulosin (FLOMAX) 0.4 MG CAPS capsule, TAKE 1 CAPSULE(0.4 MG) BY MOUTH DAILY, Disp: 30 capsule, Rfl: 0   tiZANidine (ZANAFLEX) 4 MG tablet, Take 1 tablet (4 mg total) by mouth every 8 (eight) hours as needed for muscle spasms., Disp: 270 tablet, Rfl: 3   zolpidem (AMBIEN CR) 12.5 MG CR tablet, TAKE 1 TABLET(12.5 MG) BY MOUTH AT BEDTIME AS NEEDED FOR SLEEP, Disp: 30 tablet, Rfl: 5    Physical Exam:   Affect appropriate Healthy:  appears stated age 68: nerve stimulator present  Neck supple with no adenopathy JVP normal  no bruits no thyromegaly Lungs clear with no wheezing and good diaphragmatic motion Heart:  S1/S2 no murmur, no rub, gallop or click PMI normal post sternotomy and left radial harvest  Abdomen: benighn, BS positve, no tenderness, no AAA no bruit.  No HSM or HJR Distal pulses intact with no bruits No edema Neuro non-focal Skin warm and dry No muscular weakness   Labs:   Lab Results  Component Value Date   WBC 6.6 12/25/2020   HGB 12.4 (L) 12/25/2020   HCT 36.8 (L) 12/25/2020   MCV 94.0 12/25/2020   PLT 217.0 12/25/2020   No results for input(s): NA, K, CL, CO2, BUN, CREATININE, CALCIUM, PROT, BILITOT, ALKPHOS, ALT, AST, GLUCOSE in the last 168 hours.  Invalid input(s): LABALBU Lab Results  Component Value Date   CKTOTAL 102 02/21/2015   TROPONINI 0.06 (HH) 11/29/2018    Lab Results  Component Value Date   CHOL 109 02/04/2021   CHOL 194 11/04/2020   CHOL 114 11/29/2018   Lab Results  Component Value Date   HDL 34 (L) 02/04/2021   HDL 32 (L) 11/04/2020   HDL 24 (L) 11/29/2018   Lab Results  Component Value Date   LDLCALC 59 02/04/2021   LDLCALC 141 (H) 11/04/2020   LDLCALC 84 11/29/2018   Lab Results  Component Value Date   TRIG 78 02/04/2021   TRIG 114 11/04/2020   TRIG 32 11/29/2018   Lab Results  Component Value Date   CHOLHDL 3.2 02/04/2021   CHOLHDL 6.1 (H) 11/04/2020   CHOLHDL 4.8 11/29/2018   No results found for: LDLDIRECT    Radiology: No results found.  EKG: SR rate 68 normal 03/26/21    ASSESSMENT AND PLAN:   1:  CAD/CABG:  known occlusion of radial to OM with non obstructive native disease in this area Non ischemic myovue 11/08/18 continue medical RX 2. PVD:  f/u [facility] for EVAR repeat duplex ordered no endoleak 8403 75-43% LICA stenosis by duplex 01/22/21 f/u study for April 2023  3. PAF:  F/U [place] post ablation June 2021 Amiodarone d/c [facility] April 2022 In NSR Will try Xarelto as anticoagulant and see if reflux symptoms lessen  4. HTN:   Well controlled.  Continue current medications and low sodium Dash type diet.   5. OSA:  with hypoglossal nerve stimulator f/u sleep study Dr Radford Pax 6. HLD:  on zetia and repatha LDL 59 02/04/21   Duplex f/u AAA  Carotid duplex 01/2022  F/U [place] 6 months and me in a year  Change eliquis to xarelto    Signed: Jenkins Rouge 04/22/2021, 9:10 AM

## 2021-04-22 ENCOUNTER — Other Ambulatory Visit: Payer: Self-pay

## 2021-04-22 ENCOUNTER — Encounter: Payer: Self-pay | Admitting: Cardiovascular Disease

## 2021-04-22 ENCOUNTER — Ambulatory Visit: Payer: PPO | Admitting: Cardiovascular Disease

## 2021-04-22 VITALS — BP 142/70 | HR 68 | Ht 70.0 in | Wt 205.2 lb

## 2021-04-22 DIAGNOSIS — I714 Abdominal aortic aneurysm, without rupture, unspecified: Secondary | ICD-10-CM

## 2021-04-22 DIAGNOSIS — R0989 Other specified symptoms and signs involving the circulatory and respiratory systems: Secondary | ICD-10-CM

## 2021-04-22 DIAGNOSIS — Z951 Presence of aortocoronary bypass graft: Secondary | ICD-10-CM

## 2021-04-22 DIAGNOSIS — G4733 Obstructive sleep apnea (adult) (pediatric): Secondary | ICD-10-CM | POA: Diagnosis not present

## 2021-04-22 MED ORDER — RIVAROXABAN 20 MG PO TABS: 20.0000 mg | ORAL_TABLET | Freq: Every day | ORAL | 3 refills | Status: DC

## 2021-04-22 NOTE — Patient Instructions (Signed)
Medication Instructions:  Your physician has recommended you make the following change in your medication:  1-STOP eliquis 2-START Xarelto 20 mg by mouth daily.   *If you need a refill on your cardiac medications before your next appointment, please call your pharmacy*  Lab Work: If you have labs (blood work) drawn today and your tests are completely normal, you will receive your results only by: [place] (if you have MyChart) OR A paper copy in the mail If you have any lab test that is abnormal or we need to change your treatment, we will call you to review the results.  Testing/Procedures: Your physician has requested that you have an abdominal aorta duplex. During this test, an ultrasound is used to evaluate the aorta. Allow 30 minutes for this exam. Do not eat after midnight the day before and avoid carbonated beverages  Follow-Up: At [facility], you and your health needs are our priority.  As part of our continuing mission to provide you with exceptional heart care, we have created designated Provider Care Teams.  These Care Teams include your primary Cardiologist (physician) and Advanced Practice Providers (APPs -  Physician Assistants and Nurse Practitioners) who all work together to provide you with the care you need, when you need it.  We recommend signing up for the patient portal called "MyChart".  Sign up information is provided on this After Visit Summary.  MyChart is used to connect with patients for Virtual Visits (Telemedicine).  Patients are able to view lab/test results, encounter notes, upcoming appointments, etc.  Non-urgent messages can be sent to your provider as well.   To learn more about what you can do with MyChart, go to [email or web address].    Your next appointment:   6 month(s)  The format for your next appointment:   In Person  Provider:   You may see Jenkins Rouge, MD or one of the following Advanced Practice Providers on your designated Care  Team:   Cecilie Kicks, NP

## 2021-04-23 ENCOUNTER — Ambulatory Visit (INDEPENDENT_AMBULATORY_CARE_PROVIDER_SITE_OTHER): Payer: PPO

## 2021-04-23 ENCOUNTER — Ambulatory Visit (INDEPENDENT_AMBULATORY_CARE_PROVIDER_SITE_OTHER): Payer: PPO | Admitting: Internal Medicine

## 2021-04-23 ENCOUNTER — Encounter: Payer: Self-pay | Admitting: Internal Medicine

## 2021-04-23 VITALS — BP 130/84 | HR 54 | Temp 97.8°F | Resp 18 | Ht 70.0 in | Wt 204.8 lb

## 2021-04-23 DIAGNOSIS — E782 Mixed hyperlipidemia: Secondary | ICD-10-CM | POA: Diagnosis not present

## 2021-04-23 DIAGNOSIS — R062 Wheezing: Secondary | ICD-10-CM | POA: Diagnosis not present

## 2021-04-23 DIAGNOSIS — G43809 Other migraine, not intractable, without status migrainosus: Secondary | ICD-10-CM | POA: Diagnosis not present

## 2021-04-23 DIAGNOSIS — Z0000 Encounter for general adult medical examination without abnormal findings: Secondary | ICD-10-CM

## 2021-04-23 DIAGNOSIS — R1013 Epigastric pain: Secondary | ICD-10-CM | POA: Diagnosis not present

## 2021-04-23 DIAGNOSIS — I10 Essential (primary) hypertension: Secondary | ICD-10-CM | POA: Diagnosis not present

## 2021-04-23 DIAGNOSIS — R053 Chronic cough: Secondary | ICD-10-CM | POA: Insufficient documentation

## 2021-04-23 DIAGNOSIS — T17308S Unspecified foreign body in larynx causing other injury, sequela: Secondary | ICD-10-CM | POA: Diagnosis not present

## 2021-04-23 NOTE — Assessment & Plan Note (Signed)
Flu shot yearly. Covid-19 3 shots counseled about booster. Pneumonia complete. Shingrix complete. Tetanus due 2024. Colonoscopy due 2022 and he will call to schedule. Counseled about sun safety and mole surveillance. Counseled about the dangers of distracted driving. Given 10 year screening recommendations.

## 2021-04-23 NOTE — Progress Notes (Signed)
Subjective:   Patient ID: [name] Brown, male    DOB: March 26, 1951, 70 y.o.   MRN: [id number]  HPI Here for medicare wellness and physical, no new complaints. Please see A/P for status and treatment of chronic medical problems.   HPI #3: Also here for problems with cough and congestion in his throat. Going on for awhile and we have talked about it several times in March. He does not feel that this has had anything to do with the inspire device for OSA which he has and is working well. Does have some mild SOB. He is taking protonix and not sure if this has helped at all. Some congestion in his throat.   Diet: heart healthy  Physical activity: sedentary, active Depression/mood screen: negative Hearing: intact to whispered voice Visual acuity: grossly normal with lens, performs annual eye exam  ADLs: capable Fall risk: none Home safety: good Cognitive evaluation: intact to orientation, naming, recall and repetition EOL planning: adv directives discussed  [place] Visit from 04/23/2021 in [place] at [facility] Total Score 0       I have personally reviewed and have noted 1. The patient's medical and social history - reviewed today no changes 2. Their use of alcohol, tobacco or illicit drugs 3. Their current medications and supplements 4. The patient's functional ability including ADL's, fall risks, home safety risks and hearing or visual impairment. 5. Diet and physical activities 6. Evidence for depression or mood disorders 7. Care team reviewed and updated 8.  The patient is not on an opioid pain medication.  Patient Care Team: Hoyt Koch, MD as PCP - General (Internal Medicine) Josue Hector, MD as PCP - Cardiology (Cardiology) Constance Haw, MD as PCP - Electrophysiology (Cardiology) Sueanne Margarita, MD as PCP - Sleep Medicine (Cardiology) Lujean Amel, MD as Attending Physician (Family Medicine) Charlton Haws, [facility]  as Pharmacist (Pharmacist) Past Medical History:  Diagnosis Date   Aortic aneurysm [facility]) Endograf 2009   Arthritis    Atrial fibrillation ([place])    Bradycardia    CAD (coronary artery disease) CABG 03/2000   Median sternotomy for coronary artery bypass grafting x 3 (left   HTN (hypertension)    Hx of CABG 2001   severe 3 vessel disease   Hx of colonic polyp 06/2010   Hyperplastic    Hyperlipidemia    Myocardial infarction [facility])    Sleep apnea    Past Surgical History:  Procedure Laterality Date   ATRIAL FIBRILLATION ABLATION N/A 03/19/2020   Procedure: ATRIAL FIBRILLATION ABLATION;  Surgeon: Constance Haw, MD;  Location: [place] CV LAB;  Service: Cardiovascular;  Laterality: N/A;   CARDIOVERSION N/A 11/28/2018   Procedure: CARDIOVERSION;  Surgeon: Elouise Munroe, MD;  Location: [facility] ENDOSCOPY;  Service: Cardiovascular;  Laterality: N/A;   CARDIOVERSION N/A 01/13/2021   Procedure: CARDIOVERSION;  Surgeon: Pixie Casino, MD;  Location: [facility] ENDOSCOPY;  Service: Cardiovascular;  Laterality: N/A;   COLONOSCOPY     CORONARY ARTERY BYPASS GRAFT     AAA stent graft on 12/01/2007 at [facility]  coronary bypass in 2001. Dr. Roxy Manns.   DRUG INDUCED ENDOSCOPY Right 02/18/2020   Procedure: DRUG INDUCED ENDOSCOPY;  Surgeon: Melida Quitter, MD;  Location: [place];  Service: ENT;  Laterality: Right;   FOOT SURGERY Left 07/19/2017   IMPLANTATION OF HYPOGLOSSAL NERVE STIMULATOR Right 07/30/2020   Procedure: IMPLANTATION OF HYPOGLOSSAL NERVE STIMULATOR Implantation of  Chest wall respirator Sensor Electrode Electronic Analysis of Implanted Neurostimulator Pulse Generator;  Surgeon: Melida Quitter, MD;  Location: [place];  Service: ENT;  Laterality: Right;   LEFT HEART CATHETERIZATION WITH CORONARY ANGIOGRAM N/A 10/26/2013   Procedure: LEFT HEART CATHETERIZATION WITH CORONARY ANGIOGRAM;  Surgeon: Peter M Martinique, MD;  Location: [facility] CATH LAB;   Service: Cardiovascular;  Laterality: N/A;   MASS EXCISION Right 08/10/2019   Procedure: EXCISION OF CYST FROM RIGHT SCALP;  Surgeon: Jovita Kussmaul, MD;  Location: [place];  Service: General;  Laterality: Right;   Family History  Problem Relation Age of Onset   Heart attack Mother    Heart disease Mother    Heart attack Father    Heart disease Father    Rectal cancer Neg Hx    Stomach cancer Neg Hx    Pancreatic cancer Neg Hx    Esophageal cancer Neg Hx    Colon polyps Neg Hx    Colon cancer Neg Hx     Review of Systems  Constitutional: Negative.   HENT:  Positive for sore throat.   Eyes: Negative.   Respiratory:  Positive for cough. Negative for chest tightness and shortness of breath.   Cardiovascular:  Negative for chest pain, palpitations and leg swelling.  Gastrointestinal:  Negative for abdominal distention, abdominal pain, constipation, diarrhea, nausea and vomiting.  Musculoskeletal: Negative.   Skin: Negative.   Neurological: Negative.   Psychiatric/Behavioral: Negative.     Objective:  Physical Exam Constitutional:      Appearance: He is well-developed.  HENT:     Head: Normocephalic and atraumatic.  Cardiovascular:     Rate and Rhythm: Normal rate and regular rhythm.  Pulmonary:     Effort: Pulmonary effort is normal. No respiratory distress.     Breath sounds: Normal breath sounds. No wheezing or rales.  Abdominal:     General: Bowel sounds are normal. There is no distension.     Palpations: Abdomen is soft.     Tenderness: There is no abdominal tenderness. There is no rebound.  Musculoskeletal:     Cervical back: Normal range of motion.  Skin:    General: Skin is warm and dry.  Neurological:     Mental Status: He is alert and oriented to person, place, and time.     Coordination: Coordination normal.    Vitals:   04/23/21 0947  BP: 130/84  Pulse: (!) 54  Resp: 18  Temp: 97.8 F (36.6 C)  TempSrc: Oral  SpO2: 95%  Weight:  204 lb 12.8 oz (92.9 kg)  Height: 5\' 10"  (1.778 m)   This visit occurred during the SARS-CoV-2 public health emergency.  Safety protocols were in place, including screening questions prior to the visit, additional usage of staff PPE, and extensive cleaning of exam room while observing appropriate contact time as indicated for disinfecting solutions.   Assessment & Plan:

## 2021-04-23 NOTE — Assessment & Plan Note (Signed)
Taking depakote daily and will continue. No recent headaches.

## 2021-04-23 NOTE — Assessment & Plan Note (Signed)
BP at goal on ramipril and imdur and hydralazine and lasix and recent CMP without indication for change.

## 2021-04-23 NOTE — Assessment & Plan Note (Signed)
Checking CXR today to assess for cause. If normal will refer to pulmonary for assessment of choking feeling. We did discuss again that the cough and throat clearing could be related to GERD and or post nasal drip which he has both. We are treating GERD with protonix 40 mg daily and he is taking allergy medication to help with dripping but still has some of this. Recent endoscopy per patient to place inspire with ENT (may have been laryngoscopy?).

## 2021-04-23 NOTE — Assessment & Plan Note (Signed)
Continue protonix and cardiology is switching him from eliquis to xarelto to see if this helps.

## 2021-04-23 NOTE — Assessment & Plan Note (Signed)
Recent lipid panel and cardiology is managing his praluent and zetia.

## 2021-04-23 NOTE — Patient Instructions (Signed)
We will do the chest x-ray today.   If that is normal we can get you in with the lung doctor.

## 2021-04-27 ENCOUNTER — Other Ambulatory Visit: Payer: Self-pay | Admitting: Cardiovascular Disease

## 2021-05-03 ENCOUNTER — Other Ambulatory Visit: Payer: Self-pay | Admitting: Internal Medicine

## 2021-05-03 DIAGNOSIS — N401 Enlarged prostate with lower urinary tract symptoms: Secondary | ICD-10-CM

## 2021-05-03 DIAGNOSIS — R3912 Poor urinary stream: Secondary | ICD-10-CM

## 2021-05-05 ENCOUNTER — Ambulatory Visit (HOSPITAL_BASED_OUTPATIENT_CLINIC_OR_DEPARTMENT_OTHER): Payer: PPO | Admitting: Cardiology

## 2021-05-05 ENCOUNTER — Other Ambulatory Visit: Payer: Self-pay

## 2021-05-05 DIAGNOSIS — G4733 Obstructive sleep apnea (adult) (pediatric): Secondary | ICD-10-CM

## 2021-05-07 ENCOUNTER — Other Ambulatory Visit: Payer: Self-pay

## 2021-05-07 ENCOUNTER — Ambulatory Visit (HOSPITAL_BASED_OUTPATIENT_CLINIC_OR_DEPARTMENT_OTHER): Payer: PPO | Attending: Cardiology | Admitting: Cardiology

## 2021-05-11 ENCOUNTER — Other Ambulatory Visit: Payer: Self-pay

## 2021-05-11 ENCOUNTER — Ambulatory Visit (INDEPENDENT_AMBULATORY_CARE_PROVIDER_SITE_OTHER): Payer: PPO | Admitting: Pharmacist

## 2021-05-11 DIAGNOSIS — E782 Mixed hyperlipidemia: Secondary | ICD-10-CM

## 2021-05-11 DIAGNOSIS — I25729 Atherosclerosis of autologous artery coronary artery bypass graft(s) with unspecified angina pectoris: Secondary | ICD-10-CM | POA: Diagnosis not present

## 2021-05-11 DIAGNOSIS — I10 Essential (primary) hypertension: Secondary | ICD-10-CM

## 2021-05-11 DIAGNOSIS — I4819 Other persistent atrial fibrillation: Secondary | ICD-10-CM

## 2021-05-11 NOTE — Progress Notes (Signed)
This encounter was created in error - please disregard.

## 2021-05-11 NOTE — Patient Instructions (Signed)
Visit Information  Phone number for Pharmacist: [phone]   Goals Addressed   None     Care Plan : [place]  Updates made by Charlton Haws, RPH since 05/11/2021 12:00 AM     Problem: Hypertension, Hyperlipidemia, Atrial Fibrillation and Coronary Artery Disease, GERD   Priority: High     Long-Range Goal: Disease management   Start Date: 12/03/2020  Expected End Date: 12/03/2021  This Visit's Progress: On track  Recent Progress: On track  Priority: High  Note:   Current Barriers:  Unable to independently monitor therapeutic efficacy  Pharmacist Clinical Goal(s):  Patient will achieve adherence to monitoring guidelines and medication adherence to achieve therapeutic efficacy  Interventions: 1:1 collaboration with Hoyt Koch, MD regarding development and update of comprehensive plan of care as evidenced by provider attestation and co-signature Inter-disciplinary care team collaboration (see longitudinal plan of care) Comprehensive medication review performed; medication list updated in electronic medical record  Hypertension (BP goal < 130/80) Controlled - per patient home readings are at goal, no more issues with low BP since changing ramipril to 20 mg at night Current regimen:  Ramipril 20 mg daily HS Hydralazine 25 mg twice a day Isosorbide MN 30 mg daily Furosemide 20 mg BID Interventions: Discussed BP goals and benefits of medications for prevention of heart attack / stroke Recommend to continue current medication   Hyperlipidemia / Coronary artery disease (LDL goal < 55) Controlled- LDL improved from 141 to 59 after starting Repatha; pt endorses compliance and denies issues HX - CABG 2001, AAA stent coronary graft 2009, L coronary cath 2015. Family history of premature CAD. Hx intolerance to multiple statins. Current regimen:  Ezetimibe 10 mg daily Repatha 140 mg [place] q14 days isosorbide MN 30 mg daily,  nitroglycerin 0.4 mg asneeded   aspirin 81 mg daily fish oil OTC daily Interventions: Discussed cholesterol goals and benefits of medications for prevention of heart attack / stroke Recommend to continue current medication  Atrial Fibrillation Controlled - pt is s/p ablation in Oct 2021, cardioversion 01/2021; he was recently switched from [facility] to [place] due to GERD with Eliquis, however he has not made the switch yet since he spent $400 on Eliquis and want to finish his supply  Current regimen:  Xarelto 20 mg daily (not started yet - still taking eliquis) Interventions: Discussed benefits of anticoagulation for stroke prevention Advised when switching Eliquis to Xarelto, take Xarelto at least 12 hrs after last dose of Eliquis Recommend to continue current medication  GERD / chronic cough (Goal: manage symptoms) -Not ideally controlled - symptoms have not improved much despite medication intervention; cardiologist recently suggested switch from Eliquis to [place] to help with GERD -Current treatment  Pantoprazole 40 mg daily Montelukast 10 mg daily HS Albuterol HFA prn -Recommended to continue current medication  Patient Goals/Self-Care Activities Patient will:  - take medications as prescribed -focus on medication adherence by pill box -check blood pressure daily, document, and provide at future appointments -collaborate with provider on medication access solutions (Xarelto, Repatha)      Patient verbalizes understanding of instructions provided today and agrees to view in [place].  Telephone follow up appointment with pharmacy team member scheduled for: 6 months  Charlene Brooke, PharmD, [place], CPP Clinical Pharmacist [facility] [phone]

## 2021-05-11 NOTE — Procedures (Signed)
Erroneous encounter

## 2021-05-11 NOTE — Progress Notes (Signed)
Chronic Care Management Pharmacy Note  05/11/2021 Name:  JULIO ZAPPIA MRN:  [id number] DOB:  07/14/1951  Summary: -Pt has not switched Eliquis to Xarelto yet - he is finishing supply of Eliquis first. Plans to start Xarelto ~end of August  Recommendations/Changes made from today's visit: -Advised when switching Eliquis to Xarelto, to take Xarelto at least 12 hrs after the last dose of Eliquis    Subjective: LYONEL MOREJON is an 70 y.o. year old male who is a primary patient of Hoyt Koch, MD.  The CCM team was consulted for assistance with disease management and care coordination needs.    Engaged with patient by telephone for follow up visit in response to provider referral for pharmacy case management and/or care coordination services.   Consent to Services:  The patient was given information about Chronic Care Management services, agreed to services, and gave verbal consent prior to initiation of services.  Please see initial visit note for detailed documentation.   Patient Care Team: Hoyt Koch, MD as PCP - General (Internal Medicine) Josue Hector, MD as PCP - Cardiology (Cardiology) Constance Haw, MD as PCP - Electrophysiology (Cardiology) Sueanne Margarita, MD as PCP - Sleep Medicine (Cardiology) Lujean Amel, MD as Attending Physician (Family Medicine) Charlton Haws, [facility] as Pharmacist (Pharmacist)  Recent office visits: 04/23/21 Dr Sharlet Salina OV: chronic f/u; chronic cough - ordered CXR.  12/25/20 Dr Sharlet Salina OV: c/o epigastric pain; rx pantoprazole 40 mg, reduce hydralazine to 25 mg, d/c levocetirizine  12/16/20 Dr Sharlet Salina VV: c/o troat congestion; rx montelukast, albuterol [facility]  Recent consult visits: 04/22/21 Dr Johnsie Cancel (cardology): f/u CAD, AFIB. C/o GERD w/ Eliquis. Switch to Xarelto 20 mg.  01/13/21 admission for cardioversion.  03/26/21 Dr Curt Bears (Cardiology): f/u AFIB.  11/25/20 Ivy Nwogu ([facility] clinic): changed ramipril from BID  to 20 mg in evenings. 10/30/20 Megan Supple (lipid clinic): LDL goal < 55 due to premature disease and family hx premature disease. LDL was 141, started Repatha 140 mg. 09/23/20 Dr Curt Bears (cardiology): no med changes. F/u lipid clinic due to statin intolerance. 08/27/20 Dr Radford Pax (cardiology): active Inspire device 08/01/20 Dr Johnsie Cancel (cardiology): BP still low, d/c ramipril 07/30/20 admission for [facility] device implantation. 06/19/20 Dr Curt Bears (cardiology): dc'd amiodarone. SBP has dropped at home 80s-90s. Decrease hydralazine to 25 mg BID. 03/19/20 admission for Afib ablation. 02/04/20 Dr Curt Bears (cardiology): f/u for Afib. S/p cardioversion 11/2016. May benefit from ablation.   Hospital visits: None in previous 6 months  Objective:  Lab Results  Component Value Date   CREATININE 1.18 12/25/2020   BUN 23 12/25/2020   GFR 63.17 12/25/2020   GFRNONAA 61 11/25/2020   GFRAA 70 11/25/2020   NA 142 12/25/2020   K 4.5 12/25/2020   CALCIUM 9.8 12/25/2020   CO2 30 12/25/2020    Lab Results  Component Value Date/Time   HGBA1C 5.8 (H) 11/29/2018 02:23 AM   GFR 63.17 12/25/2020 04:07 PM   GFR 52.23 (L) 03/16/2019 10:41 AM    Last diabetic Eye exam: No results found for: HMDIABEYEEXA  Last diabetic Foot exam: No results found for: HMDIABFOOTEX   Lab Results  Component Value Date   CHOL 109 02/04/2021   HDL 34 (L) 02/04/2021   LDLCALC 59 02/04/2021   TRIG 78 02/04/2021   CHOLHDL 3.2 02/04/2021    Hepatic Function Latest Ref Rng & Units 02/04/2021 12/25/2020 04/15/2020  Total Protein 6.0 - 8.5 g/dL 6.9 7.0 6.4(L)  Albumin 3.8 - 4.8 g/dL  4.6 4.1 3.8  AST 0 - 40 IU/L _0 ALT 0 - 44 IU/L _1 Alk Phosphatase 44 - 121 IU/L 64 49 42  Total Bilirubin 0.0 - 1.2 mg/dL 0.5 0.5 0.8  Bilirubin, Direct 0.00 - 0.40 mg/dL 0.17 - -    Lab Results  Component Value Date/Time   TSH 1.233 04/15/2020 09:44 AM   TSH 0.867 07/11/2019 04:19 PM   TSH 1.11 10/24/2018 04:19 PM    CBC Latest  Ref Rng & Units 12/25/2020 03/19/2020 03/16/2019  WBC 4.0 - 10.5 K/uL 6.6 4.9 7.4  Hemoglobin 13.0 - 17.0 g/dL 12.4(L) 10.8(L) 12.8(L)  Hematocrit 39.0 - 52.0 % 36.8(L) 32.8(L) 36.7(L)  Platelets 150.0 - 400.0 K/uL 217.0 183 187.0    No results found for: VD25OH  Clinical ASCVD: Yes  The ASCVD Risk score Mikey Bussing DC Jr., et al., 2013) failed to calculate for the following reasons:   The valid total cholesterol range is 130 to 320 mg/dL    Depression screen [facility] 2/9 04/23/2021 04/16/2020 10/24/2018  Decreased Interest 0 0 0  Down, Depressed, Hopeless 0 0 0  PHQ - 2 Score 0 0 0  Some recent data might be hidden    CHA2DS2-VASc Score = 3  The patient's score is based upon: CHF History: No HTN History: Yes Diabetes History: No Stroke History: No Vascular Disease History: Yes    Social History   Tobacco Use  Smoking Status Former   Packs/day: 2.00   Years: 30.00   Pack years: 60.00   Types: Cigarettes   Quit date: 01/05/1992   Years since quitting: 29.3  Smokeless Tobacco Never  Tobacco Comments   quit 19 yrs ago   BP Readings from Last 3 Encounters:  04/23/21 130/84  04/22/21 (!) 142/70  03/26/21 104/60   Pulse Readings from Last 3 Encounters:  04/23/21 (!) 54  04/22/21 68  03/26/21 68   Wt Readings from Last 3 Encounters:  05/07/21 205 lb (93 kg)  04/23/21 204 lb 12.8 oz (92.9 kg)  04/22/21 205 lb 3.2 oz (93.1 kg)    Assessment/Interventions: Review of patient past medical history, allergies, medications, health status, including review of consultants reports, laboratory and other test data, was performed as part of comprehensive evaluation and provision of chronic care management services.   SDOH:  (Social Determinants of Health) assessments and interventions performed: Yes  SDOH Screenings   Alcohol Screen: Not on file  Depression (PHQ2-9): Low Risk    PHQ-2 Score: 0  Financial Resource Strain: Medium Risk   Difficulty of Paying Living Expenses: Somewhat hard   Food Insecurity: Not on file  Housing: Not on file  Physical Activity: Not on file  Social Connections: Not on file  Stress: Not on file  Tobacco Use: Medium Risk   Smoking Tobacco Use: Former   Smokeless Tobacco Use: Never  Transportation Needs: Not on file     [place]  Allergies  Allergen Reactions   Statins     Myalgias with elevated CPKs    Medications Reviewed Today     Reviewed by Charlton Haws, [facility] (Pharmacist) on 05/11/21 at [place] List Status: <None>   Medication Order Taking? Sig Documenting Provider Last Dose Status Informant  albuterol (VENTOLIN HFA) 108 (90 Base) MCG/ACT inhaler [id number] Yes Inhale 2 puffs into the lungs every 6 (six) hours as needed for wheezing or shortness of breath. Hoyt Koch, MD Taking Active Self  aspirin EC 81  MG EC tablet [id number] Yes Take 1 tablet (81 mg total) by mouth daily. Ilean China Taking Active   Biotin 10000 MCG TABS [id number] Yes Take 10,000 mcg by mouth in the morning. [provider] Taking Active Self  cholecalciferol (VITAMIN D) 25 MCG (1000 UT) tablet [id number] Yes Take 1,000 Units by mouth every evening. [provider] Taking Active Self  diclofenac sodium (VOLTAREN) 1 % GEL [id number] Yes Apply 2 g topically 4 (four) times daily. Hoyt Koch, MD Taking Active   divalproex (DEPAKOTE ER) 500 MG 24 hr tablet [id number] Yes TAKE 1 TABLET(500 MG) BY MOUTH DAILY Hoyt Koch, MD Taking Active   doxycycline (VIBRAMYCIN) 50 MG capsule [id number] Yes Take 50 mg by mouth every evening. [provider] Taking Active Self  Evolocumab (REPATHA SURECLICK) 962 MG/ML SOAJ [id number] Yes Inject 1 pen into the skin every 14 (fourteen) days. Josue Hector, MD Taking Active   ezetimibe (ZETIA) 10 MG tablet [id number] Yes Take 1 tablet (10 mg total) by mouth daily. Josue Hector, MD Taking Active   furosemide (LASIX) 20 MG tablet [id number] Yes TAKE 1 TABLET(20  MG) BY MOUTH TWICE DAILY Josue Hector, MD Taking Active   gabapentin (NEURONTIN) 300 MG capsule [id number] Yes TAKE 1 CAPSULE(300 MG) BY MOUTH TWICE DAILY Hoyt Koch, MD Taking Active   Glucosamine-Chondroitin (GLUCOSAMINE [facility] COMPLEX PO) [id number] Yes Take 500 mg by mouth 2 (two) times daily.  [provider] Taking Active Self  hydrALAZINE (APRESOLINE) 25 MG tablet [id number] Yes Take 1 tablet (25 mg total) by mouth in the morning and at bedtime. Constance Haw, MD Taking Active Self  isosorbide mononitrate (IMDUR) 30 MG 24 hr tablet [id number] Yes TAKE 1 TABLET BY MOUTH EVERY DAY Josue Hector, MD Taking Active   montelukast (SINGULAIR) 10 MG tablet [id number] Yes Take 1 tablet (10 mg total) by mouth at bedtime. Hoyt Koch, MD Taking Active   Multiple Vitamin (MULTIVITAMIN WITH MINERALS) TABS tablet [id number] Yes Take 1 tablet by mouth every evening. [provider] Taking Active Self  nitroGLYCERIN (NITROSTAT) 0.4 MG SL tablet [id number] Yes DISSOLVE 1 TABLET UNDER THE TONGUE EVERY 5 MINUTES FOR 3 DOSES AS NEEDED FOR CHEST PAIN Josue Hector, MD Taking Active Self  Omega-3 Fatty Acids (FISH OIL) 1200 MG CAPS [id number] Yes Take 1,200 mg by mouth 2 (two) times daily. [provider] Taking Active Self  pantoprazole (PROTONIX) 40 MG tablet [id number] Yes Take 1 tablet (40 mg total) by mouth daily. Hoyt Koch, MD Taking Active   potassium chloride SA (KLOR-CON) 20 MEQ tablet [id number] Yes TAKE 1 TABLET BY MOUTH EVERY DAY Camnitz, Will Hassell Done, MD Taking Active   ramipril (ALTACE) 10 MG capsule [id number] Yes Take 2 capsules (20 mg total) by mouth daily. Josue Hector, MD Taking Active   rivaroxaban (XARELTO) 20 MG TABS tablet [id number] No Take 1 tablet (20 mg total) by mouth daily with supper.  Patient not taking: Reported on 05/11/2021   Josue Hector, MD Not Taking Active            Med Note Luna Glasgow May 11, 2021   9:26 AM) Not started yet - finishing Eliquis supply first (as of 05/11/21)  tamsulosin (FLOMAX) 0.4 MG CAPS capsule [id number] Yes TAKE 1 CAPSULE(0.4 MG) BY MOUTH DAILY Hoyt Koch, MD Taking Active   tiZANidine (ZANAFLEX) 4 MG tablet [id number] Yes Take 1 tablet (4 mg total)  by mouth every 8 (eight) hours as needed for muscle spasms. Lyndal Pulley, DO Taking Active Self  zolpidem (AMBIEN CR) 12.5 MG CR tablet [id number] Yes TAKE 1 TABLET(12.5 MG) BY MOUTH AT BEDTIME AS NEEDED FOR SLEEP Hoyt Koch, MD Taking Active             Patient Active Problem List   Diagnosis Date Noted   Chronic cough 04/23/2021   Epigastric pain 12/26/2020   Statin myopathy 09/25/2020   Insomnia 02/06/2020   Persistent atrial fibrillation ([place]) 11/23/2019   Secondary hypercoagulable state ([place]) 11/23/2019   OSA (obstructive sleep apnea) 08/27/2019   Arthralgia 04/09/2019   Throat congestion 02/23/2019   Marijuana use 12/02/2018   HLD (hyperlipidemia) 11/29/2018   CAD (coronary artery disease) 11/29/2018   Chest pain 11/29/2018   Scalp cyst 10/25/2018   Degenerative disc disease, lumbar 03/09/2018   Routine general medical examination at a health care facility 09/22/2017   BPH without urinary obstruction 07/22/2016   Anemia, iron deficiency 07/11/2015   Nonallopathic lesion-rib cage 05/29/2015   Nonallopathic lesion of thoracic region 05/29/2015   Nonallopathic lesion of cervical region 05/29/2015   AV block, 2nd degree 10/29/2013   Migraine- chronic Verapamil 10/29/2013   Paroxysmal atrial fibrillation ([place]) 10/26/2013   Carotid bruit- 37-62% LICA by doppler 8/31 03/14/2012   ALCOHOL USE 01/23/2009   Essential hypertension 01/22/2009   Bradycardia 01/22/2009   Aortic aneurysm ([place]) 01/22/2009    Immunization History  Administered Date(s) Administered   Influenza, High Dose Seasonal PF 06/13/2019   Influenza,inj,Quad PF,6+ Mos 07/24/2013, 07/25/2014   Influenza-Unspecified  07/26/2015, 07/24/2016, 06/25/2017, 07/25/2018, 06/26/2019   Moderna Sars-Covid-2 Vaccination 11/23/2019, 12/21/2019   Pneumococcal Conjugate-13 10/25/2018   Pneumococcal Polysaccharide-23 02/06/2020   Tdap 10/11/2012   Zoster Recombinat (Shingrix) 08/12/2018, 09/13/2018, 11/10/2018   Zoster, Live 07/24/2013    Conditions to be addressed/monitored:  Hypertension, Hyperlipidemia, Atrial Fibrillation and Coronary Artery Disease, GERD  Care Plan : CCM Pharmacy Care Plan  Updates made by Charlton Haws, [place] since 05/11/2021 12:00 AM     Problem: Hypertension, Hyperlipidemia, Atrial Fibrillation and Coronary Artery Disease, GERD   Priority: High     Long-Range Goal: Disease management   Start Date: 12/03/2020  Expected End Date: 12/03/2021  This Visit's Progress: On track  Recent Progress: On track  Priority: High  Note:   Current Barriers:  Unable to independently monitor therapeutic efficacy  Pharmacist Clinical Goal(s):  Patient will achieve adherence to monitoring guidelines and medication adherence to achieve therapeutic efficacy  Interventions: 1:1 collaboration with Hoyt Koch, MD regarding development and update of comprehensive plan of care as evidenced by provider attestation and co-signature Inter-disciplinary care team collaboration (see longitudinal plan of care) Comprehensive medication review performed; medication list updated in electronic medical record  Hypertension (BP goal < 130/80) Controlled - per patient home readings are at goal, no more issues with low BP since changing ramipril to 20 mg at night Current regimen:  Ramipril 20 mg daily HS Hydralazine 25 mg twice a day Isosorbide MN 30 mg daily Furosemide 20 mg BID Interventions: Discussed BP goals and benefits of medications for prevention of heart attack / stroke Recommend to continue current medication   Hyperlipidemia / Coronary artery disease (LDL goal < 55) Controlled- LDL  improved from 141 to 59 after starting Repatha; pt endorses compliance and denies issues HX - CABG 2001, AAA stent coronary graft 2009, L coronary cath 2015. Family history of premature CAD. Hx intolerance to multiple  statins. Current regimen:  Ezetimibe 10 mg daily Repatha 140 mg [place] q14 days isosorbide MN 30 mg daily,  nitroglycerin 0.4 mg asneeded  aspirin 81 mg daily fish oil OTC daily Interventions: Discussed cholesterol goals and benefits of medications for prevention of heart attack / stroke Recommend to continue current medication  Atrial Fibrillation Controlled - pt is s/p ablation in Oct 2021, cardioversion 01/2021; he was recently switched from [facility] to [facility] due to GERD with Eliquis, however he has not made the switch yet since he spent $400 on Eliquis and want to finish his supply  Current regimen:  Xarelto 20 mg daily (not started yet - still taking eliquis) Interventions: Discussed benefits of anticoagulation for stroke prevention Advised when switching Eliquis to Xarelto, take Xarelto at least 12 hrs after last dose of Eliquis Recommend to continue current medication  GERD / chronic cough (Goal: manage symptoms) -Not ideally controlled - symptoms have not improved much despite medication intervention; cardiologist recently suggested switch from Eliquis to Xarelto to help with GERD -Current treatment  Pantoprazole 40 mg daily Montelukast 10 mg daily HS Albuterol HFA prn -Recommended to continue current medication  Patient Goals/Self-Care Activities Patient will:  - take medications as prescribed -focus on medication adherence by pill box -check blood pressure daily, document, and provide at future appointments -collaborate with provider on medication access solutions (Xarelto, Repatha)       Medication Assistance:  -Repatha and Xarelto are Tier 3 and will be expensive in donut hole; can pursue [employer] grant and Eliquis PAP as  needed  Compliance/Adherence/Medication fill history: Care Gaps: Covid booster (due 01/18/20) - pt reports he had booster in Fall 2021, plans to wait until fall for next booster  Star-Rating Drugs: Ramipril - LF 02/23/21 x 90 ds  Patient's preferred pharmacy is:  [facility], [place] - [address] AT [address] [place] [address] Phone: [phone] Fax: [phone]  [facility]) - [place] [place] [address] Phone: [phone] Fax: [phone]  Uses pill box? Yes Pt endorses 100% compliance  We discussed: Current pharmacy is preferred with insurance plan and patient is satisfied with pharmacy services Patient decided to: Continue current medication management strategy  Care Plan and Follow Up Patient Decision:  Patient agrees to Care Plan and Follow-up.  Plan: Telephone follow up appointment with care management team member scheduled for:  6 months  Charlene Brooke, PharmD, [place], CPP Clinical Pharmacist [facility] [phone]

## 2021-05-12 ENCOUNTER — Other Ambulatory Visit: Payer: Self-pay | Admitting: Internal Medicine

## 2021-05-12 ENCOUNTER — Other Ambulatory Visit: Payer: Self-pay

## 2021-05-12 DIAGNOSIS — I719 Aortic aneurysm of unspecified site, without rupture: Secondary | ICD-10-CM

## 2021-05-14 NOTE — Telephone Encounter (Addendum)
Inspire Titration order placed per TT.  Prior Authorization for Inspire titration sent to [facility] via Fax [phone].

## 2021-05-14 NOTE — Telephone Encounter (Signed)
Sueanne Margarita, MD  [name], CMA 2 home sleep studies have come back with no information - please set up for inlab Inspire titrate with Sleep lab

## 2021-05-14 NOTE — Addendum Note (Signed)
Addended by: [name] on: 05/14/2021 10:15 AM   Modules accepted: Orders

## 2021-05-27 ENCOUNTER — Ambulatory Visit (HOSPITAL_COMMUNITY)
Admission: RE | Admit: 2021-05-27 | Discharge: 2021-05-27 | Disposition: A | Discharge status: Home / Self Care | Payer: PPO | Source: Ambulatory Visit | Attending: Vascular Surgery | Admitting: Vascular Surgery

## 2021-05-27 ENCOUNTER — Other Ambulatory Visit: Payer: Self-pay

## 2021-05-27 ENCOUNTER — Ambulatory Visit: Payer: PPO | Admitting: Vascular Surgery

## 2021-05-27 ENCOUNTER — Encounter: Payer: Self-pay | Admitting: Vascular Surgery

## 2021-05-27 VITALS — BP 145/78 | HR 50 | Temp 98.0°F | Resp 20 | Ht 70.0 in | Wt 203.0 lb

## 2021-05-27 DIAGNOSIS — I719 Aortic aneurysm of unspecified site, without rupture: Secondary | ICD-10-CM

## 2021-05-27 NOTE — Progress Notes (Signed)
REASON FOR VISIT:   Follow-up after endovascular aneurysm repair.  MEDICAL ISSUES:   S/P ENDOVASCULAR ANEURYSM REPAIR: The patient is doing well status post endovascular repair of his aneurysm which was 5.3 cm initially.  By duplex today it is 3.1 cm.  This is increased only slightly from 2.74 3 years ago.  I think rather than wait for 3 years we will get a follow-up duplex in 2 years.  I encouraged him to stay as active as possible.  He is not a smoker.  He is on aspirin.  HPI:   [name] Brown is a pleasant 70 y.o. male who underwent endovascular repair of an abdominal aortic aneurysm in 2009.  At that time the aneurysm was 5.3 cm in maximum diameter.  I last saw him on 05/10/2018.  At that time the maximum diameter of his aneurysm was 2.74 cm which is decreased from 3.22 years ago.  This reason I felt it was reasonable to stretch his follow-up out to 3 years.  Comes in for a routine follow-up visit.  Since I saw him last the patient has been doing well except for some cardiac issues.  He has had a cardioversion and an ablation.  He is on Xarelto for atrial fibrillation.  He denies any chest pain.  He denies any significant claudication.  He denies rest pain.  He denies nonhealing wounds.  He is not a smoker.  Past Medical History:  Diagnosis Date   Aortic aneurysm [facility]) Endograf 2009   Arthritis    Atrial fibrillation (HCC)    Bradycardia    CAD (coronary artery disease) CABG 03/2000   Median sternotomy for coronary artery bypass grafting x 3 (left   HTN (hypertension)    Hx of CABG 2001   severe 3 vessel disease   Hx of colonic polyp 06/2010   Hyperplastic    Hyperlipidemia    Myocardial infarction [facility])    Sleep apnea     Family History  Problem Relation Age of Onset   Heart attack Mother    Heart disease Mother    Heart attack Father    Heart disease Father    Rectal cancer Neg Hx    Stomach cancer Neg Hx    Pancreatic cancer Neg Hx    Esophageal cancer Neg Hx     Colon polyps Neg Hx    Colon cancer Neg Hx     SOCIAL HISTORY: Social History   Tobacco Use   Smoking status: Former    Packs/day: 2.00    Years: 30.00    Pack years: 60.00    Types: Cigarettes    Quit date: 01/05/1992    Years since quitting: 29.4   Smokeless tobacco: Never   Tobacco comments:    quit 19 yrs ago  Substance Use Topics   Alcohol use: No    Allergies  Allergen Reactions   Statins     Myalgias with elevated CPKs    Current Outpatient Medications  Medication Sig Dispense Refill   albuterol (VENTOLIN HFA) 108 (90 Base) MCG/ACT inhaler Inhale 2 puffs into the lungs every 6 (six) hours as needed for wheezing or shortness of breath. 8 g 0   aspirin EC 81 MG EC tablet Take 1 tablet (81 mg total) by mouth daily.     Biotin 10000 MCG TABS Take 10,000 mcg by mouth in the morning.     cholecalciferol (VITAMIN D) 25 MCG (1000 UT) tablet Take 1,000 Units by mouth every  evening.     diclofenac sodium (VOLTAREN) 1 % GEL Apply 2 g topically 4 (four) times daily. 100 g 3   divalproex (DEPAKOTE ER) 500 MG 24 hr tablet TAKE 1 TABLET(500 MG) BY MOUTH DAILY 90 tablet 1   doxycycline (VIBRAMYCIN) 50 MG capsule Take 50 mg by mouth every evening.     Evolocumab (REPATHA SURECLICK) [id number] MG/ML SOAJ Inject 1 pen into the skin every 14 (fourteen) days. 6 mL 3   ezetimibe (ZETIA) 10 MG tablet Take 1 tablet (10 mg total) by mouth daily. 90 tablet 3   furosemide (LASIX) 20 MG tablet TAKE 1 TABLET(20 MG) BY MOUTH TWICE DAILY 180 tablet 3   gabapentin (NEURONTIN) 300 MG capsule TAKE 1 CAPSULE(300 MG) BY MOUTH TWICE DAILY 180 capsule 2   Glucosamine-Chondroitin (GLUCOSAMINE CHONDR COMPLEX PO) Take 500 mg by mouth 2 (two) times daily.      hydrALAZINE (APRESOLINE) 25 MG tablet Take 1 tablet (25 mg total) by mouth in the morning and at bedtime. 270 tablet 2   isosorbide mononitrate (IMDUR) 30 MG 24 hr tablet TAKE 1 TABLET BY MOUTH EVERY DAY 90 tablet 3   montelukast (SINGULAIR) 10 MG tablet Take  1 tablet (10 mg total) by mouth at bedtime. 90 tablet 3   Multiple Vitamin (MULTIVITAMIN WITH MINERALS) TABS tablet Take 1 tablet by mouth every evening.     nitroGLYCERIN (NITROSTAT) 0.4 MG SL tablet DISSOLVE 1 TABLET UNDER THE TONGUE EVERY 5 MINUTES FOR 3 DOSES AS NEEDED FOR CHEST PAIN 25 tablet 4   Omega-3 Fatty Acids (FISH OIL) 1200 MG CAPS Take 1,200 mg by mouth 2 (two) times daily.     pantoprazole (PROTONIX) 40 MG tablet TAKE 1 TABLET(40 MG) BY MOUTH DAILY 30 tablet 3   potassium chloride SA (KLOR-CON) 20 MEQ tablet TAKE 1 TABLET BY MOUTH EVERY DAY 90 tablet 2   ramipril (ALTACE) 10 MG capsule Take 2 capsules (20 mg total) by mouth daily. 180 capsule 3   rivaroxaban (XARELTO) 20 MG TABS tablet Take 1 tablet (20 mg total) by mouth daily with supper. 90 tablet 3   tamsulosin (FLOMAX) 0.4 MG CAPS capsule TAKE 1 CAPSULE(0.4 MG) BY MOUTH DAILY 90 capsule 3   tiZANidine (ZANAFLEX) 4 MG tablet Take 1 tablet (4 mg total) by mouth every 8 (eight) hours as needed for muscle spasms. 270 tablet 3   zolpidem (AMBIEN CR) 12.5 MG CR tablet TAKE 1 TABLET(12.5 MG) BY MOUTH AT BEDTIME AS NEEDED FOR SLEEP 30 tablet 5   No current facility-administered medications for this visit.    REVIEW OF SYSTEMS:  '[X]'$  denotes positive finding, '[ ]'$  denotes negative finding Cardiac  Comments:  Chest pain or chest pressure:    Shortness of breath upon exertion:    Short of breath when lying flat:    Irregular heart rhythm:        Vascular    Pain in calf, thigh, or hip brought on by ambulation:    Pain in feet at night that wakes you up from your sleep:     Blood clot in your veins:    Leg swelling:         Pulmonary    Oxygen at home:    Productive cough:     Wheezing:         Neurologic    Sudden weakness in arms or legs:     Sudden numbness in arms or legs:     Sudden onset of difficulty speaking  or slurred speech:    Temporary loss of vision in one eye:     Problems with dizziness:          Gastrointestinal    Blood in stool:     Vomited blood:         Genitourinary    Burning when urinating:     Blood in urine:        Psychiatric    Major depression:         Hematologic    Bleeding problems:    Problems with blood clotting too easily:        Skin    Rashes or ulcers:        Constitutional    Fever or chills:     PHYSICAL EXAM:   Vitals:   05/27/21 0823  BP: (!) 145/78  Pulse: (!) 50  Resp: 20  Temp: 98 F (36.7 C)  SpO2: 95%  Weight: 203 lb (92.1 kg)  Height: '5\' 10"'$  (1.778 m)    GENERAL: The patient is a well-nourished male, in no acute distress. The vital signs are documented above. CARDIAC: There is a regular rate and rhythm.  VASCULAR: I do not detect carotid bruits. He has palpable femoral pulses and palpable pedal pulses bilaterally. He has no significant lower extremity swelling. PULMONARY: There is good air exchange bilaterally without wheezing or rales. ABDOMEN: Soft and non-tender with normal pitched bowel sounds.  MUSCULOSKELETAL: There are no major deformities or cyanosis. NEUROLOGIC: No focal weakness or paresthesias are detected. SKIN: There are no ulcers or rashes noted. PSYCHIATRIC: The patient has a normal affect.  DATA:    DUPLEX ABDOMINAL AORTA: I have independently interpreted his duplex of the abdominal aorta.  The maximum diameter of his aneurysm is 3.08 cm.  Thus this is stable over the last 3 years.  It was 2.74 cm in 2019.  Deitra Mayo [facility] [phone]

## 2021-06-07 ENCOUNTER — Other Ambulatory Visit: Payer: Self-pay | Admitting: Internal Medicine

## 2021-06-19 ENCOUNTER — Ambulatory Visit: Payer: PPO | Admitting: Gastroenterology

## 2021-06-21 ENCOUNTER — Other Ambulatory Visit: Payer: Self-pay | Admitting: Cardiology

## 2021-07-02 ENCOUNTER — Other Ambulatory Visit: Payer: Self-pay | Admitting: Internal Medicine

## 2021-07-08 ENCOUNTER — Ambulatory Visit: Payer: PPO | Admitting: Gastroenterology

## 2021-07-15 ENCOUNTER — Encounter: Payer: Self-pay | Admitting: Gastroenterology

## 2021-07-15 ENCOUNTER — Ambulatory Visit: Payer: PPO | Admitting: Gastroenterology

## 2021-07-15 ENCOUNTER — Other Ambulatory Visit (INDEPENDENT_AMBULATORY_CARE_PROVIDER_SITE_OTHER): Payer: PPO

## 2021-07-15 ENCOUNTER — Telehealth: Payer: Self-pay

## 2021-07-15 VITALS — BP 138/68 | HR 67 | Ht 70.0 in | Wt 201.5 lb

## 2021-07-15 DIAGNOSIS — D649 Anemia, unspecified: Secondary | ICD-10-CM

## 2021-07-15 DIAGNOSIS — Z86010 Personal history of colonic polyps: Secondary | ICD-10-CM

## 2021-07-15 DIAGNOSIS — Z7901 Long term (current) use of anticoagulants: Secondary | ICD-10-CM

## 2021-07-15 LAB — IBC + FERRITIN
Ferritin: 114.5 ng/mL (ref 22.0–322.0)
Iron: 91 ug/dL (ref 42–165)
Saturation Ratios: 30.7 % (ref 20.0–50.0)
TIBC: 296.8 ug/dL (ref 250.0–450.0)
Transferrin: 212 mg/dL (ref 212.0–360.0)

## 2021-07-15 LAB — CBC WITH DIFFERENTIAL/PLATELET
Basophils Absolute: 0 10*3/uL (ref 0.0–0.1)
Basophils Relative: 0.6 % (ref 0.0–3.0)
Eosinophils Absolute: 0.2 10*3/uL (ref 0.0–0.7)
Eosinophils Relative: 3.1 % (ref 0.0–5.0)
HCT: 40.2 % (ref 39.0–52.0)
Hemoglobin: 13.4 g/dL (ref 13.0–17.0)
Lymphocytes Relative: 32.7 % (ref 12.0–46.0)
Lymphs Abs: 1.8 10*3/uL (ref 0.7–4.0)
MCHC: 33.3 g/dL (ref 30.0–36.0)
MCV: 96.3 fl (ref 78.0–100.0)
Monocytes Absolute: 0.6 10*3/uL (ref 0.1–1.0)
Monocytes Relative: 10.5 % (ref 3.0–12.0)
Neutro Abs: 3 10*3/uL (ref 1.4–7.7)
Neutrophils Relative %: 53.1 % (ref 43.0–77.0)
Platelets: 230 10*3/uL (ref 150.0–400.0)
RBC: 4.17 Mil/uL — ABNORMAL LOW (ref 4.22–5.81)
RDW: 15.1 % (ref 11.5–15.5)
WBC: 5.6 10*3/uL (ref 4.0–10.5)

## 2021-07-15 MED ORDER — SUTAB 1479-225-188 MG PO TABS: 1.0000 | ORAL_TABLET | Freq: Once | ORAL | 0 refills | Status: AC

## 2021-07-15 NOTE — Telephone Encounter (Signed)
Called and spoke to patient. He understands to hold Xarelto on Saturday 10-8 and Sunday 10-9

## 2021-07-15 NOTE — Progress Notes (Signed)
HPI :  70 year old male with a history of CAD status post CABG, stent in place, status post EVAR in 2009 for AAA, history of A. fib on Xarelto, here for reassessment to discuss surveillance colonoscopy.  He was last seen in August 2019 for colonoscopy.  He had 10 small polyps removed at that time, at least 6 of them were determined to be adenomatous/sessile serrated polyps.  He also had diverticulosis.  It was recommended that he have a repeat exam in 3 years at that time.  He states has been doing pretty well since last last visit.  He denies any problems with his bowels.  No blood in his stools.  Moving his bowels regularly.  No abdominal pains.  He denies any family history of colon cancer.  Interestingly he previously was on Eliquis for his A. fib however he states he had side effects of nausea and reflux on this, requiring use of antacid which worked, however recently switched to [place] for the past few months and the nausea has completely resolved.  He is not on any further antacids and he is feeling pretty well.  He has had 2 cardioversions and an ablation for his A. fib in the past.  His last cardioversion was in April of this year.  He has not had any recurrent A. fib since that time that he is aware of.  He states he is usually quite symptomatic with this.  He denies any cardiopulmonary symptoms.  Denies any problems with anesthesia in the past.  Echocardiogram in 01/29/2020 showed preserved ejection fraction, grade 2 diastolic dysfunction.  Of note, looking at his labs since June 2020 he had a mild anemia ranging with a hemoglobin of 12.8-10.8.  Last checked in March with a hemoglobin of 12.4.  Do not see any iron or B12 studies on file.  MCV has ranged from 94-102.   Prior work-up Colonoscopy 06/22/2010 - 37mm rectosigmoid polyp, hyperplastic, good prep - Dr. Starr Sinclair [facility]   Colonoscopy 06/08/18 - The perianal and digital rectal examinations were normal. - Four sessile polyps were found  in the ascending colon. The polyps were 3 to 4 mm in size. These polyps were removed with a cold snare. Resection and retrieval were complete. - Two sessile polyps were found in the transverse colon. The polyps were 3 to 4 mm in size. These polyps were removed with a cold snare. Resection and retrieval were complete. - Four sessile polyps were found in the sigmoid colon. The polyps were 3 to 4 mm in size. These polyps were removed with a cold snare. Resection and retrieval were complete. - Scattered small-mouthed diverticula were found in the entire colon. The sigmoid colon was angulated with sharp turns. - The colon was spastic which prolonged the procedure. The exam was otherwise without abnormality.  Surgical [P], ascending, transverse, sigmoid, polyp (10) - TUBULAR ADENOMA (5 FRAGMENTS) - SESSILE SERRATED POLYP (1 FRAGMENTS) - NO HIGH GRADE DYSPLASIA OR MALIGNANCY IDENTIFIED  Echo 01/21/20 - EF 55-60%, grade II DD   Past Medical History:  Diagnosis Date   Aortic aneurysm [facility]) Endograf 2009   Arthritis    Atrial fibrillation (HCC)    Bradycardia    CAD (coronary artery disease) CABG 03/2000   Median sternotomy for coronary artery bypass grafting x 3 (left   HTN (hypertension)    Hx of CABG 2001   severe 3 vessel disease   Hx of colonic polyp 06/2010   Hyperplastic    Hyperlipidemia  Myocardial infarction [facility])    Sleep apnea      Past Surgical History:  Procedure Laterality Date   ATRIAL FIBRILLATION ABLATION N/A 03/19/2020   Procedure: ATRIAL FIBRILLATION ABLATION;  Surgeon: Constance Haw, MD;  Location: [place] CV LAB;  Service: Cardiovascular;  Laterality: N/A;   CARDIOVERSION N/A 11/28/2018   Procedure: CARDIOVERSION;  Surgeon: Elouise Munroe, MD;  Location: [facility] ENDOSCOPY;  Service: Cardiovascular;  Laterality: N/A;   CARDIOVERSION N/A 01/13/2021   Procedure: CARDIOVERSION;  Surgeon: Pixie Casino, MD;  Location: [facility] ENDOSCOPY;  Service: Cardiovascular;   Laterality: N/A;   COLONOSCOPY     CORONARY ARTERY BYPASS GRAFT     AAA stent graft on 12/01/2007 at [facility]  coronary bypass in 2001. Dr. Roxy Manns.   DRUG INDUCED ENDOSCOPY Right 02/18/2020   Procedure: DRUG INDUCED ENDOSCOPY;  Surgeon: Melida Quitter, MD;  Location: [place];  Service: ENT;  Laterality: Right;   FOOT SURGERY Left 07/19/2017   IMPLANTATION OF HYPOGLOSSAL NERVE STIMULATOR Right 07/30/2020   Procedure: IMPLANTATION OF HYPOGLOSSAL NERVE STIMULATOR Implantation of Chest wall respirator Sensor Electrode Electronic Analysis of Implanted Neurostimulator Pulse Generator;  Surgeon: Melida Quitter, MD;  Location: [place];  Service: ENT;  Laterality: Right;   LEFT HEART CATHETERIZATION WITH CORONARY ANGIOGRAM N/A 10/26/2013   Procedure: LEFT HEART CATHETERIZATION WITH CORONARY ANGIOGRAM;  Surgeon: Peter M Martinique, MD;  Location: [facility] CATH LAB;  Service: Cardiovascular;  Laterality: N/A;   MASS EXCISION Right 08/10/2019   Procedure: EXCISION OF CYST FROM RIGHT SCALP;  Surgeon: Jovita Kussmaul, MD;  Location: [place];  Service: General;  Laterality: Right;   Family History  Problem Relation Age of Onset   Heart attack Mother    Heart disease Mother    Heart attack Father    Heart disease Father    Rectal cancer Neg Hx    Stomach cancer Neg Hx    Pancreatic cancer Neg Hx    Esophageal cancer Neg Hx    Colon polyps Neg Hx    Colon cancer Neg Hx    Social History   Tobacco Use   Smoking status: Former    Packs/day: 2.00    Years: 30.00    Pack years: 60.00    Types: Cigarettes    Quit date: 01/05/1992    Years since quitting: 29.5   Smokeless tobacco: Never   Tobacco comments:    quit 19 yrs ago  Vaping Use   Vaping Use: Never used  Substance Use Topics   Alcohol use: No   Drug use: No   Current Outpatient Medications  Medication Sig Dispense Refill   albuterol (VENTOLIN HFA) 108 (90 Base) MCG/ACT  inhaler INHALE 2 PUFFS INTO THE LUNGS EVERY 6 HOURS AS NEEDED FOR WHEEZING OR SHORTNESS OF BREATH 54 g 1   aspirin EC 81 MG EC tablet Take 1 tablet (81 mg total) by mouth daily.     Biotin 10000 MCG TABS Take 10,000 mcg by mouth in the morning.     cholecalciferol (VITAMIN D) 25 MCG (1000 UT) tablet Take 1,000 Units by mouth every evening.     diclofenac sodium (VOLTAREN) 1 % GEL Apply 2 g topically 4 (four) times daily. 100 g 3   divalproex (DEPAKOTE ER) 500 MG 24 hr tablet TAKE 1 TABLET(500 MG) BY MOUTH DAILY 90 tablet 1   doxycycline (VIBRAMYCIN) 50 MG capsule Take 50 mg by mouth every evening.  Evolocumab (REPATHA SURECLICK) 540 MG/ML SOAJ Inject 1 pen into the skin every 14 (fourteen) days. 6 mL 3   ezetimibe (ZETIA) 10 MG tablet Take 1 tablet (10 mg total) by mouth daily. 90 tablet 3   furosemide (LASIX) 20 MG tablet TAKE 1 TABLET(20 MG) BY MOUTH TWICE DAILY 180 tablet 3   gabapentin (NEURONTIN) 300 MG capsule TAKE 1 CAPSULE(300 MG) BY MOUTH TWICE DAILY 180 capsule 2   Glucosamine-Chondroitin (GLUCOSAMINE CHONDR COMPLEX PO) Take 500 mg by mouth 2 (two) times daily.      hydrALAZINE (APRESOLINE) 25 MG tablet Take 1 tablet (25 mg total) by mouth in the morning and at bedtime. 270 tablet 2   isosorbide mononitrate (IMDUR) 30 MG 24 hr tablet TAKE 1 TABLET BY MOUTH EVERY DAY 90 tablet 3   montelukast (SINGULAIR) 10 MG tablet Take 1 tablet (10 mg total) by mouth at bedtime. 90 tablet 3   Multiple Vitamin (MULTIVITAMIN WITH MINERALS) TABS tablet Take 1 tablet by mouth every evening.     nitroGLYCERIN (NITROSTAT) 0.4 MG SL tablet DISSOLVE 1 TABLET UNDER THE TONGUE EVERY 5 MINUTES FOR 3 DOSES AS NEEDED FOR CHEST PAIN 25 tablet 4   Omega-3 Fatty Acids (FISH OIL) 1200 MG CAPS Take 1,200 mg by mouth 2 (two) times daily.     potassium chloride SA (KLOR-CON) 20 MEQ tablet TAKE 1 TABLET BY MOUTH EVERY DAY 90 tablet 3   ramipril (ALTACE) 10 MG capsule Take 2 capsules (20 mg total) by mouth daily. 180  capsule 3   rivaroxaban (XARELTO) 20 MG TABS tablet Take 1 tablet (20 mg total) by mouth daily with supper. 90 tablet 3   tamsulosin (FLOMAX) 0.4 MG CAPS capsule TAKE 1 CAPSULE(0.4 MG) BY MOUTH DAILY 90 capsule 3   tiZANidine (ZANAFLEX) 4 MG tablet Take 1 tablet (4 mg total) by mouth every 8 (eight) hours as needed for muscle spasms. 270 tablet 3   vitamin C (ASCORBIC ACID) 500 MG tablet Take 500 mg by mouth daily.     zolpidem (AMBIEN CR) 12.5 MG CR tablet TAKE 1 TABLET(12.5 MG) BY MOUTH AT BEDTIME AS NEEDED FOR SLEEP 30 tablet 5   No current facility-administered medications for this visit.   Allergies  Allergen Reactions   Statins     Myalgias with elevated CPKs     Review of Systems: All systems reviewed and negative except where noted in HPI.   Labs per HPI  Physical Exam: BP 138/68   Pulse 67   Ht 5\' 10"  (1.778 m)   Wt 201 lb 8 oz (91.4 kg)   BMI 28.91 kg/m  Constitutional: Pleasant,well-developed, male in no acute distress. HEENT: Normocephalic and atraumatic. Conjunctivae are normal. No scleral icterus. Neck supple.  Cardiovascular: Normal rate, regular rhythm.  Pulmonary/chest: Effort normal and breath sounds normal.  Abdominal: Soft, nondistended, nontender.  There are no masses palpable.  Extremities: no edema Lymphadenopathy: No cervical adenopathy noted. Neurological: Alert and oriented to person place and time. Skin: Skin is warm and dry. No rashes noted. Psychiatric: Normal mood and affect. Behavior is normal.   ASSESSMENT AND PLAN: 70 year old male here for reassessment of the following:  History of colon polyps Anemia Anticoagulated  He is due for surveillance colonoscopy given multiple adenomas removed over 3 years ago.  His atrial fibrillation appears well controlled in recent months and he is asymptomatic in this light.  He wishes to proceed with colonoscopy after discussion of risks and benefits.  He will need to hold  Xarelto for 2 days prior to  the exam to minimize his bleeding risk.  He understands very small increased risk of CVA when doing this.  Otherwise we discussed his anemia.  He has no symptoms related to this.  We will repeat his CBC along with iron studies and B12 levels to further evaluate.  If he has an iron deficiency then he will need upper endoscopy as well.  He agrees with the plan, further recommendations pending results.  Jolly Mango, MD [facility]  CC: Hoyt Koch, *

## 2021-07-15 NOTE — Telephone Encounter (Signed)
Will route to pharm then pt will need call. Last OV 04/2021.

## 2021-07-15 NOTE — Telephone Encounter (Signed)
Patient with diagnosis of afib on Xarelto for anticoagulation.    Procedure: colonoscopy Date of procedure: 10/10/222  CHA2DS2-VASc Score = 3  This indicates a 3.2% annual risk of stroke. The patient's score is based upon: CHF History: 0 HTN History: 1 Diabetes History: 0 Stroke History: 0 Vascular Disease History: 1 Age Score: 1 Gender Score: 0   CrCl 17mL/min Platelet count 217K  Per office protocol, patient can hold Xarelto for 2 days prior to procedure as requested.

## 2021-07-15 NOTE — Telephone Encounter (Signed)
   Name: [name] Brown  DOB: 1951-06-20  MRN: [id number]   Primary Cardiologist: Jenkins Rouge, MD  Chart reviewed as part of pre-operative protocol coverage. Patient was contacted 07/15/2021 in reference to pre-operative risk assessment for pending surgery as outlined below. I reached out to patient for update on how he is doing. The patient affirms he has been doing well without any new cardiac symptoms. Last DCCV 01/2021 per patient. Therefore, based on ACC/AHA guidelines, the patient would be at acceptable risk for the planned procedure without further cardiovascular testing. The patient was advised that if he develops new symptoms prior to surgery to contact our office to arrange for a follow-up visit, and he verbalized understanding.  Per pharmD, Per office protocol, patient can hold Xarelto for 2 days prior to procedure as requested.  Per notes below Tia Alert, CMA has already reached out to patient with instructions. Will fax finalized clearance as requested and close out clearance on our end. Please call with questions.  Charlie Pitter, PA-C 07/15/2021, 5:41 PM

## 2021-07-15 NOTE — Patient Instructions (Addendum)
If you are age 69 or older, your body mass index should be between 23-30. Your Body mass index is 28.91 kg/m. If this is out of the aforementioned range listed, please consider follow up with your Primary Care Provider.  If you are age 6 or younger, your body mass index should be between 19-25. Your Body mass index is 28.91 kg/m. If this is out of the aformentioned range listed, please consider follow up with your Primary Care Provider.   __________________________________________________________  The [facility] providers would like to encourage you to use [facility] to communicate with providers for non-urgent requests or questions.  Due to long hold times on the telephone, sending your provider a message by [facility] may be a faster and more efficient way to get a response.  Please allow 48 business hours for a response.  Please remember that this is for non-urgent requests.   You have been scheduled for a colonoscopy. Please follow written instructions given to you at your visit today.  Please pick up your prep supplies at the pharmacy within the next 1-3 days. If you use inhalers (even only as needed), please bring them with you on the day of your procedure.   You will be contacted by our office prior to your procedure for directions on holding your XARELTO.  If you do not hear from our office 1 week prior to your scheduled procedure, please call [phone] to discuss.  Please go to the lab in the basement of our building to have lab work done as you leave today. Hit "B" for basement when you get on the elevator.  When the doors open the lab is on your left.  We will call you with the results. Thank you.  Thank you for entrusting me with your care and for choosing [facility], Dr. [place] Cellar

## 2021-07-15 NOTE — Telephone Encounter (Signed)
[facility] Pre-operative Risk Assessment     Request for surgical clearance:     Endoscopy Procedure  What type of surgery is being performed?    colonoscopy  When is this surgery scheduled?     Monday, 07-20-21  What type of clearance is required ?   Pharmacy  Are there any medications that need to be held prior to surgery and how long? Xarelto 2 day  Practice name and name of physician performing surgery?    [place]oa Cellar, MD  [facility]  What is your office phone and fax number?      Phone- [phone]  Fax- [phone]  attn: Tia Alert  Anesthesia type (None, local, MAC, general) ?    MAC   Thank you!

## 2021-07-16 ENCOUNTER — Encounter: Payer: Self-pay | Admitting: Gastroenterology

## 2021-07-20 ENCOUNTER — Ambulatory Visit (AMBULATORY_SURGERY_CENTER): Payer: PPO | Admitting: Gastroenterology

## 2021-07-20 ENCOUNTER — Other Ambulatory Visit: Payer: Self-pay

## 2021-07-20 ENCOUNTER — Encounter: Payer: Self-pay | Admitting: Gastroenterology

## 2021-07-20 VITALS — BP 148/75 | HR 43 | Temp 97.1°F | Resp 9 | Ht 70.0 in | Wt 201.8 lb

## 2021-07-20 DIAGNOSIS — I2510 Atherosclerotic heart disease of native coronary artery without angina pectoris: Secondary | ICD-10-CM | POA: Diagnosis not present

## 2021-07-20 DIAGNOSIS — D124 Benign neoplasm of descending colon: Secondary | ICD-10-CM

## 2021-07-20 DIAGNOSIS — Z86010 Personal history of colonic polyps: Secondary | ICD-10-CM | POA: Diagnosis not present

## 2021-07-20 DIAGNOSIS — G4733 Obstructive sleep apnea (adult) (pediatric): Secondary | ICD-10-CM | POA: Diagnosis not present

## 2021-07-20 DIAGNOSIS — D125 Benign neoplasm of sigmoid colon: Secondary | ICD-10-CM

## 2021-07-20 MED ORDER — SODIUM CHLORIDE 0.9 % IV SOLN
500.0000 mL | Freq: Once | INTRAVENOUS | Status: DC
Start: 2021-07-20 — End: 2021-07-20

## 2021-07-20 NOTE — Patient Instructions (Signed)
Impression/Recommendations:  Polyp, diverticulosis, and hemorrhoid handouts given to patient.  Resume previous diet. Continue present medications.l Resume Xarelto tomorrow. Await pathology results.  YOU HAD AN ENDOSCOPIC PROCEDURE TODAY AT [facility]:   Refer to the procedure report that was given to you for any specific questions about what was found during the examination.  If the procedure report does not answer your questions, please call your gastroenterologist to clarify.  If you requested that your care partner not be given the details of your procedure findings, then the procedure report has been included in a sealed envelope for you to review at your convenience later.  YOU SHOULD EXPECT: Some feelings of bloating in the abdomen. Passage of more gas than usual.  Walking can help get rid of the air that was put into your GI tract during the procedure and reduce the bloating. If you had a lower endoscopy (such as a colonoscopy or flexible sigmoidoscopy) you may notice spotting of blood in your stool or on the toilet paper. If you underwent a bowel prep for your procedure, you may not have a normal bowel movement for a few days.  Please Note:  You might notice some irritation and congestion in your nose or some drainage.  This is from the oxygen used during your procedure.  There is no need for concern and it should clear up in a day or so.  SYMPTOMS TO REPORT IMMEDIATELY:  Following lower endoscopy (colonoscopy or flexible sigmoidoscopy):  Excessive amounts of blood in the stool  Significant tenderness or worsening of abdominal pains  Swelling of the abdomen that is new, acute  Fever of 100F or higher  For urgent or emergent issues, a gastroenterologist can be reached at any hour by calling [phone]. Do not use MyChart messaging for urgent concerns.    DIET:  We do recommend a small meal at first, but then you may proceed to your regular diet.  Drink plenty  of fluids but you should avoid alcoholic beverages for 24 hours.  ACTIVITY:  You should plan to take it easy for the rest of today and you should NOT DRIVE or use heavy machinery until tomorrow (because of the sedation medicines used during the test).    FOLLOW UP: Our staff will call the number listed on your records 48-72 hours following your procedure to check on you and address any questions or concerns that you may have regarding the information given to you following your procedure. If we do not reach you, we will leave a message.  We will attempt to reach you two times.  During this call, we will ask if you have developed any symptoms of COVID 19. If you develop any symptoms (ie: fever, flu-like symptoms, shortness of breath, cough etc.) before then, please call [phone].  If you test positive for Covid 19 in the 2 weeks post procedure, please call and report this information to [place].    If any biopsies were taken you will be contacted by phone or by letter within the next 1-3 weeks.  Please call us at [phone] if you have not heard about the biopsies in 3 weeks.    SIGNATURES/CONFIDENTIALITY: You and/or your care partner have signed paperwork which will be entered into your electronic medical record.  These signatures attest to the fact that that the information above on your After Visit Summary has been reviewed and is understood.  Full responsibility of the confidentiality of this discharge information lies with  you and/or your care-partner.

## 2021-07-20 NOTE — Progress Notes (Signed)
Pt. And care partner say that pt.'s heart rate is normally in the 40's, sometimes in the 50's.  Pt. And care partner told that pt. To resume Xarelto tomorrow.

## 2021-07-20 NOTE — Progress Notes (Signed)
Called to room to assist during endoscopic procedure.  Patient ID and intended procedure confirmed with present staff. Received instructions for my participation in the procedure from the performing physician.  

## 2021-07-20 NOTE — Progress Notes (Signed)
Report given to PACU, vss 

## 2021-07-20 NOTE — Progress Notes (Signed)
Pt's states no medical or surgical changes since previsit or office visit.  Vitals by DT 

## 2021-07-20 NOTE — Op Note (Signed)
[place] Patient Name: [name] Brown Procedure Date: 07/20/2021 2:10 PM MRN: [id number] Endoscopist: Remo Lipps P. Havery Moros , MD Age: 70 Referring MD:  Date of Birth: 1950-10-26 Gender: Male Account #: [id number] Procedure:                Colonoscopy Indications:              High risk colon cancer surveillance: Personal                            history of colonic polyps - 6 adenomas / sessile                            serrated polyps 05/2018 Medicines:                Monitored Anesthesia Care Procedure:                Pre-Anesthesia Assessment:                           - Prior to the procedure, a History and Physical                            was performed, and patient medications and                            allergies were reviewed. The patient's tolerance of                            previous anesthesia was also reviewed. The risks                            and benefits of the procedure and the sedation                            options and risks were discussed with the patient.                            All questions were answered, and informed consent                            was obtained. Prior Anticoagulants: The patient has                            taken Xarelto (rivaroxaban), last dose was 2 days                            prior to procedure. ASA Grade Assessment: III - A                            patient with severe systemic disease. After                            reviewing the risks and benefits, the patient was  deemed in satisfactory condition to undergo the                            procedure.                           After obtaining informed consent, the colonoscope                            was passed under direct vision. Throughout the                            procedure, the patient's blood pressure, pulse, and                            oxygen saturations were monitored continuously. The                             Olympus PCF-H190DL ([id number]) Colonoscope was                            introduced through the anus and advanced to the the                            cecum, identified by appendiceal orifice and                            ileocecal valve. The colonoscopy was performed                            without difficulty. The patient tolerated the                            procedure well. The quality of the bowel                            preparation was good. The ileocecal valve,                            appendiceal orifice, and rectum were photographed. Scope In: 2:24:37 PM Scope Out: 2:41:31 PM Scope Withdrawal Time: 0 hours 14 minutes 39 seconds  Total Procedure Duration: 0 hours 16 minutes 54 seconds  Findings:                 The perianal and digital rectal examinations were                            normal.                           Multiple medium-mouthed diverticula were found in                            the entire colon. This caused some luminal  narrowing / restricted mobility of the left colon.                            Adult colonoscope used initially could not traverse                            this area. It was removed and replaced with a                            pediatric colonoscope to complete the exam and                            traverse this area.                           A diminutive polyp was found in the descending                            colon. The polyp was sessile. The polyp was removed                            with a cold snare. Resection and retrieval were                            complete.                           A diminutive polyp was found in the sigmoid colon.                            The polyp was sessile. The polyp was removed with a                            cold snare. Resection and retrieval were complete.                           Internal hemorrhoids were found during                             retroflexion. The hemorrhoids were small.                           The exam was otherwise without abnormality. Complications:            No immediate complications. Estimated blood loss:                            Minimal. Estimated Blood Loss:     Estimated blood loss was minimal. Impression:               - Diverticulosis in the entire examined colon.                           - One diminutive polyp in the descending colon,  removed with a cold snare. Resected and retrieved.                           - One diminutive polyp in the sigmoid colon,                            removed with a cold snare. Resected and retrieved.                           - Internal hemorrhoids.                           - The examination was otherwise normal. Recommendation:           - Patient has a contact number available for                            emergencies. The signs and symptoms of potential                            delayed complications were discussed with the                            patient. Return to normal activities tomorrow.                            Written discharge instructions were provided to the                            patient.                           - Resume previous diet.                           - Continue present medications.                           - Resume Xarelto tomorrow                           - Await pathology results. Remo Lipps P. Cristela Stalder, MD 07/20/2021 2:46:37 PM This report has been signed electronically.

## 2021-07-20 NOTE — Progress Notes (Signed)
History and Physical Interval Note: Seen in the office on 07/15/21. No interval changes since that time. Here for surveillance colonoscopy for history of colon polyps. Last took Xarelto 2 days ago. He wishes to proceed.   07/20/2021 2:12 PM  [name]  has presented today for endoscopic procedure(s), with the diagnosis of  Encounter Diagnosis  Name Primary?   History of colon polyps Yes  .  The various methods of evaluation and treatment have been discussed with the patient and/or family. After consideration of risks, benefits and other options for treatment, the patient has consented to  the endoscopic procedure(s).   The patient's history has been reviewed, patient examined, no change in status, stable for surgery.  I have reviewed the patient's chart and labs.  Questions were answered to the patient's satisfaction.    Jolly Mango, MD [facility]

## 2021-07-21 ENCOUNTER — Other Ambulatory Visit: Payer: Self-pay | Admitting: Internal Medicine

## 2021-07-21 DIAGNOSIS — G44009 Cluster headache syndrome, unspecified, not intractable: Secondary | ICD-10-CM

## 2021-07-22 ENCOUNTER — Telehealth: Payer: Self-pay

## 2021-07-22 NOTE — Telephone Encounter (Signed)
  Follow up Call-  Call back number 07/20/2021  Post procedure Call Back phone  # [phone]  Permission to leave phone message Yes  Some recent data might be hidden     Patient questions:  Do you have a fever, pain , or abdominal swelling? No. Pain Score  0 *  Have you tolerated food without any problems? Yes.    Have you been able to return to your normal activities? Yes.    Do you have any questions about your discharge instructions: Diet   No. Medications  No. Follow up visit  No.  Do you have questions or concerns about your Care? No.  Actions: * If pain score is 4 or above: No action needed, pain <4.  Have you developed a fever since your procedure? no  2.   Have you had an respiratory symptoms (SOB or cough) since your procedure? no  3.   Have you tested positive for COVID 19 since your procedure no  4.   Have you had any family members/close contacts diagnosed with the COVID 19 since your procedure?  no   If yes to any of these questions please route to Joylene John, RN and Joella Prince, RN

## 2021-08-01 IMAGING — MR MR LUMBAR SPINE W/O CM
4 of 5 series · 26 of 48 positions shown · non-contrast
Comparison: Radiograph May 01, 2019

CLINICAL DATA: Radiculopathy worsening to the right side

EXAM:
MRI LUMBAR SPINE WITHOUT CONTRAST
TECHNIQUE: Multiplanar, multisequence MR imaging of the lumbar spine was
performed. No intravenous contrast was administered.

[Series 2: T2 · sagittal · 4.0mm · 1.09mm/px · 6 of 16 slices shown (1 of 2)]
[im 1/16]
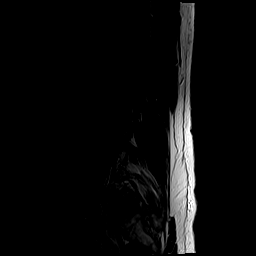
[im 4/16]
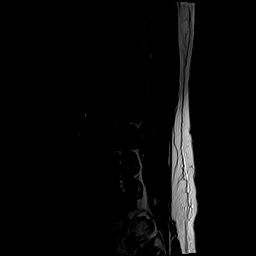
[im 7/16]
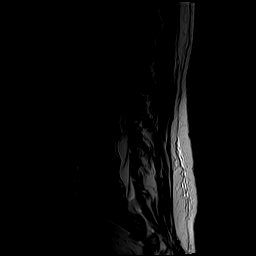
[im 10/16]
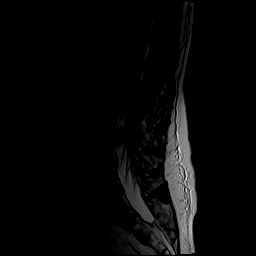
[im 13/16]
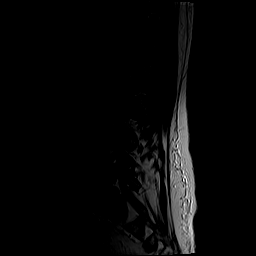
[im 16/16]
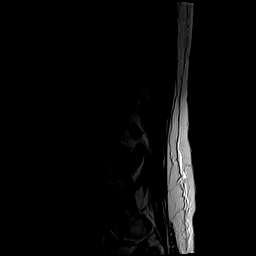

[Series 4: T1 · sagittal · 4.0mm · 1.09mm/px · 6 of 16 slices shown (1 of 2)]
[im 1/16]
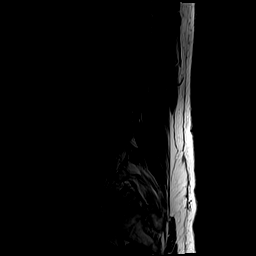
[im 4/16]
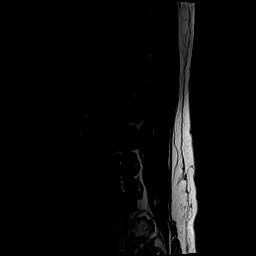
[im 7/16]
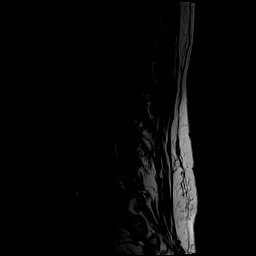
[im 10/16]
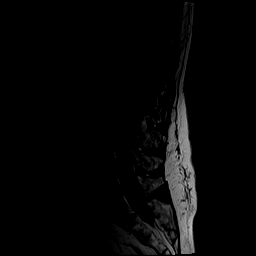
[im 13/16]
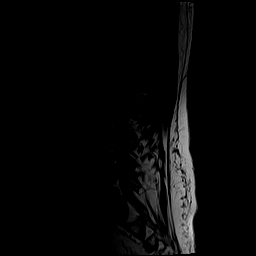
[im 16/16]
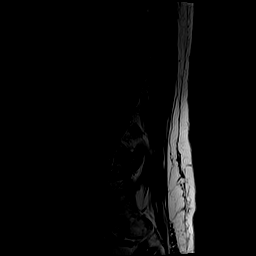

[Series 5: T2 · axial · 4.0mm · 0.39mm/px · z∈[-77,+129]mm · 9 of 40 slices shown (2 of 2)]
[im 1/40]
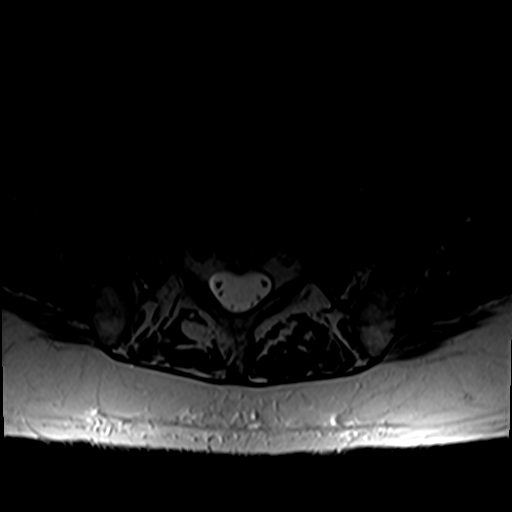
[im 6/40]
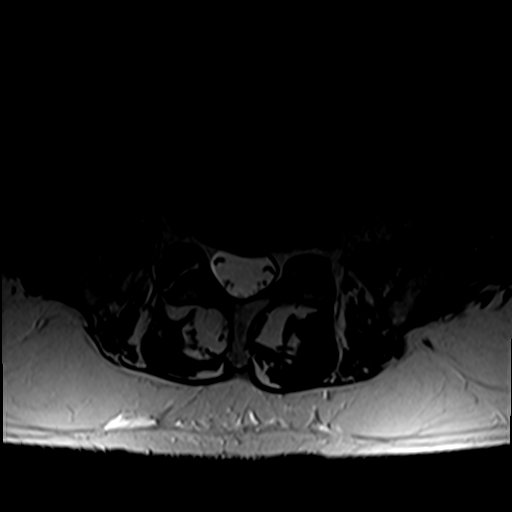
[im 12/40]
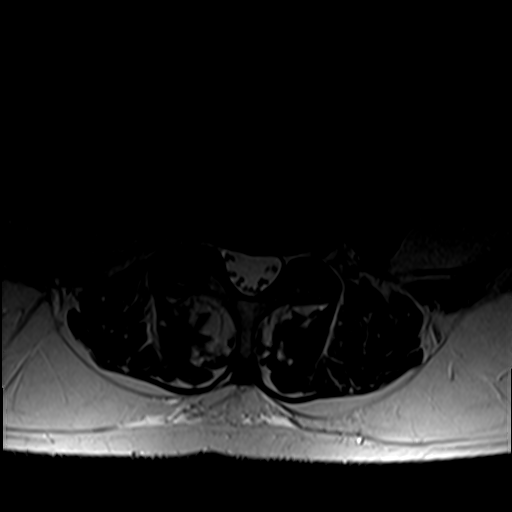
[im 17/40]
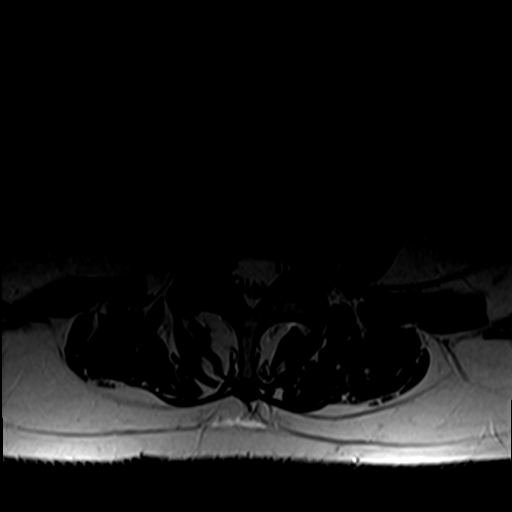
[im 20/40]
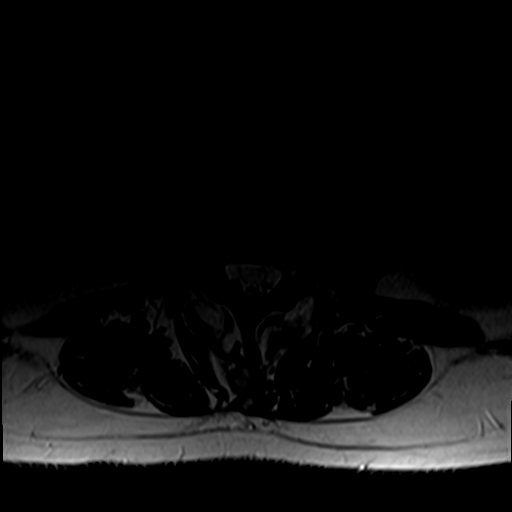
[im 23/40]
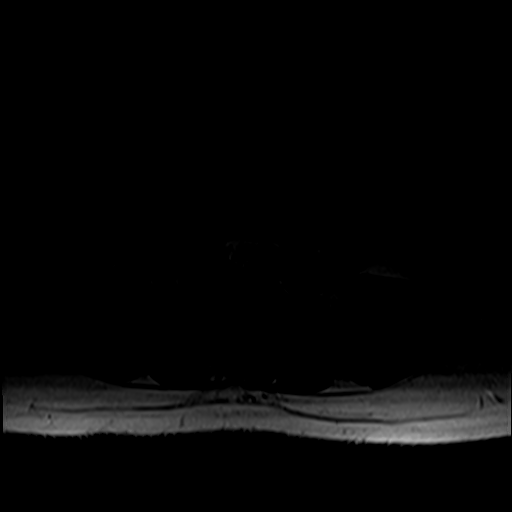
[im 28/40]
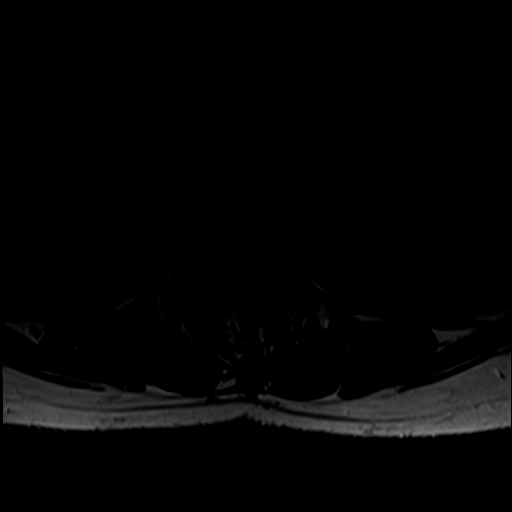
[im 34/40]
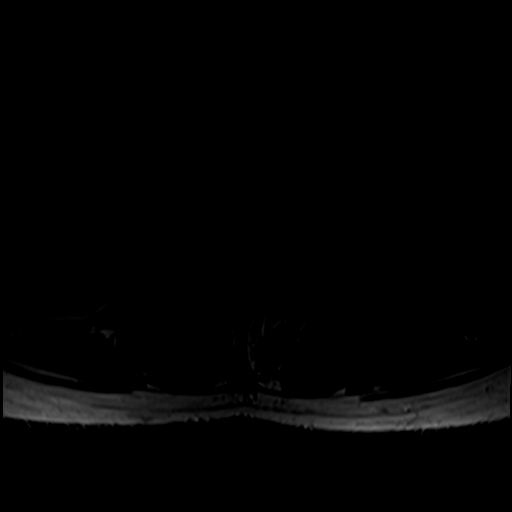
[im 40/40]
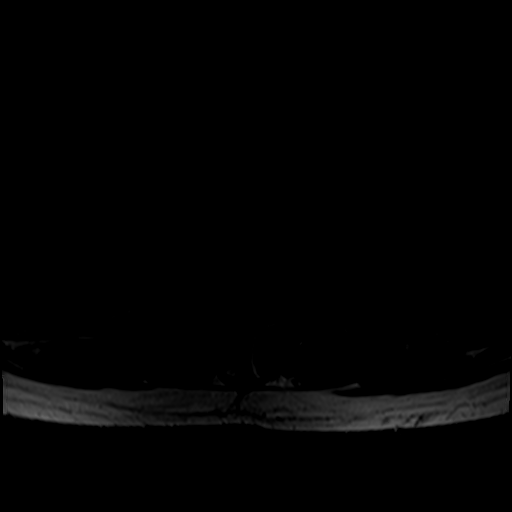

[Series 6: T1 · axial · 4.0mm · 0.39mm/px · z∈[-77,+101]mm · 5 of 40 slices shown (2 of 2)]
[im 1/40]
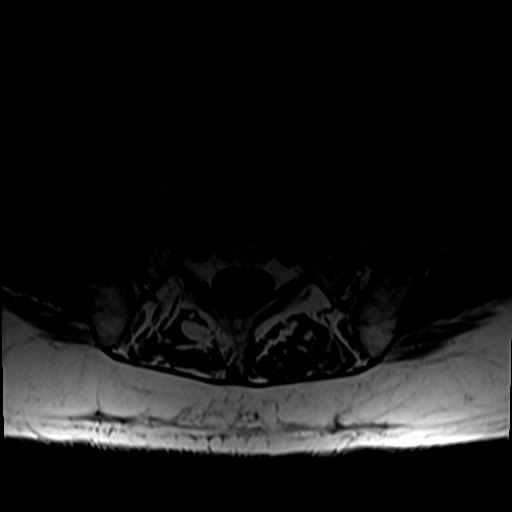
[im 6/40]
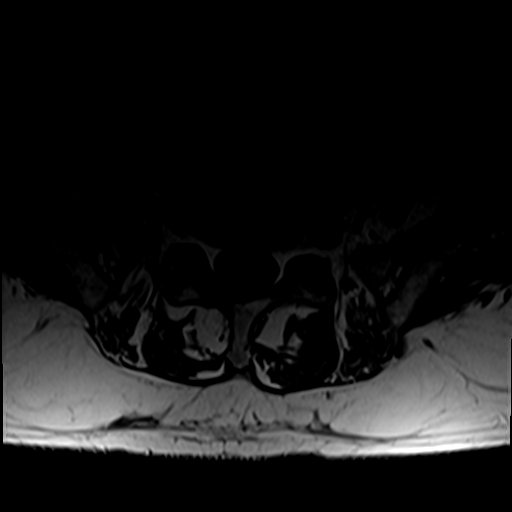
[im 12/40]
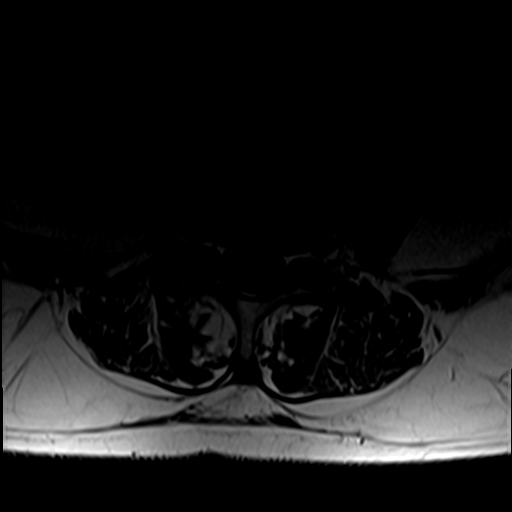
[im 20/40]
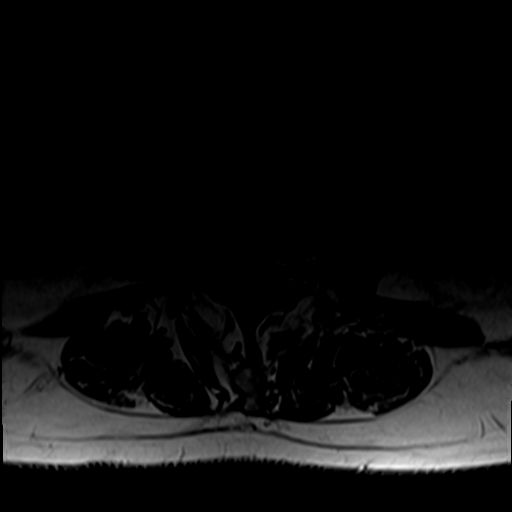
[im 34/40]
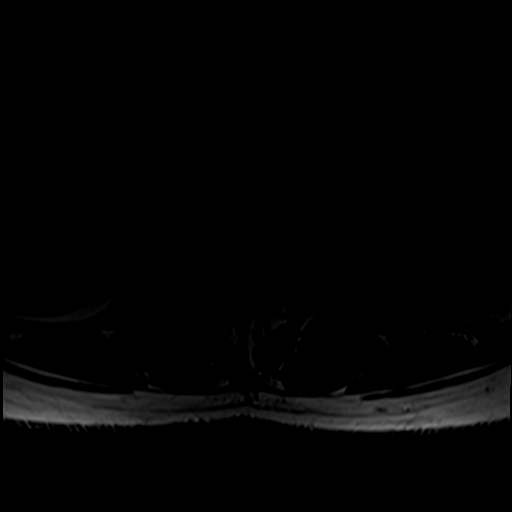

[26 of 48 positions shown; findings below may reference images not displayed]

FINDINGS: Segmentation: There are 5 non-rib bearing lumbar type vertebral
bodies with the last intervertebral disc space labeled as L5-S1.

Alignment: There is a minimal retrolisthesis of L2 on L3 and L5 on
S1.

Vertebrae: The vertebral body heights are well maintained. No
fracture, marrow edema,or pathologic marrow infiltration. There is
increased STIR signal seen at the endplates of L3-L4 at the anterior
endplates of L1-L2. Small anterior osteophytes are noted. Fatty
endplate changes are seen at L2. A T1/T2 dark osseous lesion seen
within the L2 vertebral body, likely bone island. The

Conus medullaris and cauda equina: Conus extends to the L2 level.
Conus and cauda equina appear normal.

Paraspinal and other soft tissues: The paraspinal soft tissues and
visualized retroperitoneal structures are unremarkable. There is
partially visualized T2 bright cystic lesion seen within the left
kidney. There is a also a largest cystic lesion seen within the
right retroperitoneum, likely within the right kidney as on a prior
CT. The sacroiliac joints are intact.

Disc levels:

T12-L1:  No significant canal or neural foraminal narrowing.

L1-L2: There is a broad-based disc bulge with a left paracentral
disc protrusion which contacts the descending left L2 nerve root.
There is moderate bilateral neural foraminal narrowing. Mild
effacement anterior thecal sac is seen.

L2-L3: There is a broad-based disc bulge with ligamentum flavum
hypertrophy there is mild bilateral neural foraminal narrowing. Mild
effacement anterior thecal sac is seen.

L3-L4: There is a broad-based disc bulge with a right foraminal disc
protrusion which contacts the exiting right L3 nerve root within the
foramina. There is moderate left and severe right neural foraminal
narrowing. There is mild effacement anterior thecal sac.

L4-L5: There is a broad-based disc bulge with ligamentum flavum
hypertrophy and facet arthrosis causes mild bilateral neural
foraminal narrowing.

L5-S1: There is a broad-based disc bulge with a left far lateral
disc protrusion. There is moderate bilateral neural foraminal
narrowing.
IMPRESSION: 1. Minimal retrolisthesis of L2 on L3 and L5 on S1.
2. Modic type endplate changes seen at L3-L4.
3. Lumbar spine spondylosis most notable at L3-L4 with a right
foraminal disc protrusion which contacts the exiting right L3 nerve
root within the foramina. There is also moderate left and severe
right neural foraminal narrowing.

## 2021-08-16 ENCOUNTER — Other Ambulatory Visit: Payer: Self-pay | Admitting: Cardiovascular Disease

## 2021-08-17 ENCOUNTER — Other Ambulatory Visit: Payer: Self-pay | Admitting: Cardiology

## 2021-08-18 ENCOUNTER — Telehealth: Payer: Self-pay

## 2021-08-18 MED ORDER — HYDRALAZINE HCL 25 MG PO TABS: 25.0000 mg | ORAL_TABLET | Freq: Two times a day (BID) | ORAL | 1 refills | Status: DC

## 2021-08-18 NOTE — Telephone Encounter (Signed)
Patient called the office requesting a refill on his Hydralazine.   Chart reviewed.  Rx(s) sent to pharmacy electronically.

## 2021-09-15 ENCOUNTER — Other Ambulatory Visit: Payer: Self-pay | Admitting: Internal Medicine

## 2021-10-05 ENCOUNTER — Other Ambulatory Visit: Payer: Self-pay | Admitting: Cardiovascular Disease

## 2021-10-05 DIAGNOSIS — E785 Hyperlipidemia, unspecified: Secondary | ICD-10-CM

## 2021-10-05 DIAGNOSIS — I25729 Atherosclerosis of autologous artery coronary artery bypass graft(s) with unspecified angina pectoris: Secondary | ICD-10-CM

## 2021-10-14 ENCOUNTER — Ambulatory Visit: Payer: PPO | Admitting: Cardiology

## 2021-10-14 ENCOUNTER — Encounter: Payer: Self-pay | Admitting: Cardiology

## 2021-10-14 ENCOUNTER — Other Ambulatory Visit: Payer: Self-pay

## 2021-10-14 VITALS — BP 130/80 | HR 67 | Ht 70.0 in | Wt 200.8 lb

## 2021-10-14 DIAGNOSIS — I4819 Other persistent atrial fibrillation: Secondary | ICD-10-CM | POA: Diagnosis not present

## 2021-10-14 NOTE — Patient Instructions (Addendum)
Medication Instructions:  Your physician recommends that you continue on your current medications as directed. Please refer to the Current Medication list given to you today.  *If you need a refill on your cardiac medications before your next appointment, please call your pharmacy*   Lab Work: None ordered   Testing/Procedures: None ordered   Follow-Up: At [facility], you and your health needs are our priority.  As part of our continuing mission to provide you with exceptional heart care, we have created designated Provider Care Teams.  These Care Teams include your primary Cardiologist (physician) and Advanced Practice Providers (APPs -  Physician Assistants and Nurse Practitioners) who all work together to provide you with the care you need, when you need it.  Your next appointment:   6 month(s)  The format for your next appointment:   In Person  Provider:   You will see one of the following Advanced Practice Providers on your designated Care Team:   Renee Ursuy, PA-C Michael "Andy" Tillery, PA-C   Thank you for choosing [facility]!!   Laelle Bridgett, RN [phone]         

## 2021-10-14 NOTE — Progress Notes (Signed)
Electrophysiology Office Note   Date:  10/14/2021   ID:  [name] Brown, DOB 05-02-1951, MRN [id number]  PCP:  Hoyt Koch, MD  Cardiologist:  Johnsie Cancel Primary Electrophysiologist:  Elby Blackwelder Meredith Leeds, MD    No chief complaint on file.     History of Present Illness: [name] Brown is a 71 y.o. male who is being seen today for the evaluation of atrial fibrillation at the request of Hoyt Koch, *. Presenting today for electrophysiology evaluation.    He has a history significant coronary artery disease status post three-vessel CABG, hypertension, hyperlipidemia, atrial fibrillation.  He was found to have atrial fibrillation incidentally in 2018.  He was put on Eliquis.  He became intolerant of beta-blockers due to resting bradycardia.  He is status post atrial fibrillation ablation 03/19/2020.  He has had episodes of atrial fibrillation requiring cardioversion on 01/13/2021.    Today, denies symptoms of palpitations, chest pain, shortness of breath, orthopnea, PND, lower extremity edema, claudication, dizziness, presyncope, syncope, bleeding, or neurologic sequela. The patient is tolerating medications without difficulties.  He has had no further episodes of atrial fibrillation.  He overall feels well.  He has no chest pain or shortness of breath.  He traveled to [place] over the holidays and walked around the city quite a bit without issue.  He is noted no further episodes of atrial fibrillation and is happy with his control.  Past Medical History:  Diagnosis Date   Aortic aneurysm [facility]) Endograf 2009   Arthritis    Atrial fibrillation (HCC)    Bradycardia    CAD (coronary artery disease) CABG 03/2000   Median sternotomy for coronary artery bypass grafting x 3 (left   HTN (hypertension)    Hx of CABG 2001   severe 3 vessel disease   Hx of colonic polyp 06/2010   Hyperplastic    Hyperlipidemia    Myocardial infarction [facility])    Sleep apnea    Past Surgical  History:  Procedure Laterality Date   ATRIAL FIBRILLATION ABLATION N/A 03/19/2020   Procedure: ATRIAL FIBRILLATION ABLATION;  Surgeon: Constance Haw, MD;  Location: [place] CV LAB;  Service: Cardiovascular;  Laterality: N/A;   CARDIOVERSION N/A 11/28/2018   Procedure: CARDIOVERSION;  Surgeon: Elouise Munroe, MD;  Location: [facility] ENDOSCOPY;  Service: Cardiovascular;  Laterality: N/A;   CARDIOVERSION N/A 01/13/2021   Procedure: CARDIOVERSION;  Surgeon: Pixie Casino, MD;  Location: [facility] ENDOSCOPY;  Service: Cardiovascular;  Laterality: N/A;   COLONOSCOPY     CORONARY ARTERY BYPASS GRAFT     AAA stent graft on 12/01/2007 at [facility]  coronary bypass in 2001. Dr. Roxy Manns.   DRUG INDUCED ENDOSCOPY Right 02/18/2020   Procedure: DRUG INDUCED ENDOSCOPY;  Surgeon: Melida Quitter, MD;  Location: [place];  Service: ENT;  Laterality: Right;   FOOT SURGERY Left 07/19/2017   IMPLANTATION OF HYPOGLOSSAL NERVE STIMULATOR Right 07/30/2020   Procedure: IMPLANTATION OF HYPOGLOSSAL NERVE STIMULATOR Implantation of Chest wall respirator Sensor Electrode Electronic Analysis of Implanted Neurostimulator Pulse Generator;  Surgeon: Melida Quitter, MD;  Location: [place];  Service: ENT;  Laterality: Right;   LEFT HEART CATHETERIZATION WITH CORONARY ANGIOGRAM N/A 10/26/2013   Procedure: LEFT HEART CATHETERIZATION WITH CORONARY ANGIOGRAM;  Surgeon: Peter M Martinique, MD;  Location: [facility];  Service: Cardiovascular;  Laterality: N/A;   MASS EXCISION Right 08/10/2019   Procedure: EXCISION OF CYST FROM RIGHT SCALP;  Surgeon: Autumn Messing  III, MD;  Location: [place];  Service: General;  Laterality: Right;     Current Outpatient Medications  Medication Sig Dispense Refill   albuterol (VENTOLIN HFA) 108 (90 Base) MCG/ACT inhaler INHALE 2 PUFFS INTO THE LUNGS EVERY 6 HOURS AS NEEDED FOR WHEEZING OR SHORTNESS OF BREATH 54 g 1   aspirin EC 81 MG EC  tablet Take 1 tablet (81 mg total) by mouth daily.     Biotin 10000 MCG TABS Take 10,000 mcg by mouth in the morning.     cholecalciferol (VITAMIN D) 25 MCG (1000 UT) tablet Take 1,000 Units by mouth every evening.     diclofenac sodium (VOLTAREN) 1 % GEL Apply 2 g topically 4 (four) times daily. 100 g 3   divalproex (DEPAKOTE ER) 500 MG 24 hr tablet TAKE 1 TABLET(500 MG) BY MOUTH DAILY 90 tablet 1   doxycycline (VIBRAMYCIN) 50 MG capsule Take 50 mg by mouth every evening.     Evolocumab (REPATHA SURECLICK) 161 MG/ML SOAJ INJECT 1 PEN INTO THE SKIN EVERY 14 DAYS 6 mL 1   ezetimibe (ZETIA) 10 MG tablet Take 1 tablet (10 mg total) by mouth daily. 90 tablet 3   furosemide (LASIX) 20 MG tablet TAKE 1 TABLET(20 MG) BY MOUTH TWICE DAILY 180 tablet 3   gabapentin (NEURONTIN) 300 MG capsule TAKE 1 CAPSULE(300 MG) BY MOUTH TWICE DAILY 180 capsule 2   Glucosamine-Chondroitin (GLUCOSAMINE CHONDR COMPLEX PO) Take 500 mg by mouth 2 (two) times daily.      guaiFENesin (MUCINEX) 600 MG 12 hr tablet Take 600 mg by mouth 2 (two) times daily.     hydrALAZINE (APRESOLINE) 25 MG tablet Take 1 tablet (25 mg total) by mouth in the morning and at bedtime. 270 tablet 1   isosorbide mononitrate (IMDUR) 30 MG 24 hr tablet TAKE 1 TABLET BY MOUTH EVERY DAY 90 tablet 3   montelukast (SINGULAIR) 10 MG tablet Take 1 tablet (10 mg total) by mouth at bedtime. 90 tablet 3   Multiple Vitamin (MULTIVITAMIN WITH MINERALS) TABS tablet Take 1 tablet by mouth every evening.     nitroGLYCERIN (NITROSTAT) 0.4 MG SL tablet DISSOLVE 1 TABLET UNDER THE TONGUE EVERY 5 MINUTES FOR 3 DOSES AS NEEDED FOR CHEST PAIN 25 tablet 4   Omega-3 Fatty Acids (FISH OIL) 1200 MG CAPS Take 1,200 mg by mouth 2 (two) times daily.     potassium chloride SA (KLOR-CON) 20 MEQ tablet TAKE 1 TABLET BY MOUTH EVERY DAY 90 tablet 3   ramipril (ALTACE) 10 MG capsule Take 2 capsules (20 mg total) by mouth daily. 180 capsule 3   rivaroxaban (XARELTO) 20 MG TABS tablet  Take 1 tablet (20 mg total) by mouth daily with supper. 90 tablet 3   tamsulosin (FLOMAX) 0.4 MG CAPS capsule TAKE 1 CAPSULE(0.4 MG) BY MOUTH DAILY 90 capsule 3   vitamin C (ASCORBIC ACID) 500 MG tablet Take 500 mg by mouth daily.     zolpidem (AMBIEN CR) 12.5 MG CR tablet TAKE 1 TABLET(12.5 MG) BY MOUTH AT BEDTIME AS NEEDED FOR SLEEP 30 tablet 5   tiZANidine (ZANAFLEX) 4 MG tablet Take 1 tablet (4 mg total) by mouth every 8 (eight) hours as needed for muscle spasms. (Patient not taking: Reported on 10/14/2021) 270 tablet 3   No current facility-administered medications for this visit.    Allergies:   Statins   Social History:  The patient  reports that he quit smoking about 29 years ago. His smoking use  included cigarettes. He has a 60.00 pack-year smoking history. He has never used smokeless tobacco. He reports that he does not currently use drugs after having used the following drugs: Marijuana. He reports that he does not drink alcohol.   Family History:  The patient's family history includes Heart attack in his father and mother; Heart disease in his father and mother.   ROS:  Please see the history of present illness.   Otherwise, review of systems is positive for none.   All other systems are reviewed and negative.   PHYSICAL EXAM: VS:  BP 130/80    Pulse 67    Ht 5\' 10"  (1.778 m)    Wt 200 lb 12.8 oz (91.1 kg)    SpO2 95%    BMI 28.81 kg/m  , BMI Body mass index is 28.81 kg/m. GEN: Well nourished, well developed, in no acute distress  HEENT: normal  Neck: no JVD, carotid bruits, or masses Cardiac: RRR; no murmurs, rubs, or gallops,no edema  Respiratory:  clear to auscultation bilaterally, normal work of breathing GI: soft, nontender, nondistended, + BS MS: no deformity or atrophy  Skin: warm and dry Neuro:  Strength and sensation are intact Psych: euthymic mood, full affect  EKG:  EKG is ordered today. Personal review of the ekg ordered shows sinus rhythm  Recent  Labs: 12/25/2020: BUN 23; Creatinine, Ser 1.18; Potassium 4.5; Sodium 142 02/04/2021: ALT 13 07/15/2021: Hemoglobin 13.4; Platelets 230.0    Lipid Panel     Component Value Date/Time   CHOL 109 02/04/2021 0747   TRIG 78 02/04/2021 0747   HDL 34 (L) 02/04/2021 0747   CHOLHDL 3.2 02/04/2021 0747   CHOLHDL 4.8 11/29/2018 0223   VLDL 6 11/29/2018 0223   LDLCALC 59 02/04/2021 0747     Wt Readings from Last 3 Encounters:  10/14/21 200 lb 12.8 oz (91.1 kg)  07/20/21 201 lb 12.8 oz (91.5 kg)  07/15/21 201 lb 8 oz (91.4 kg)      Other studies Reviewed: Additional studies/ records that were reviewed today include: TTE 01/21/20 Review of the above records today demonstrates:   1. Left ventricular ejection fraction, by estimation, is 55 to 60%. The  left ventricle has normal function. Left ventricular endocardial border  not optimally defined to evaluate regional wall motion. There is mild left  ventricular hypertrophy. Left  ventricular diastolic parameters are consistent with Grade II diastolic  dysfunction (pseudonormalization).   2. Right ventricular systolic function is normal. The right ventricular  size is mildly enlarged. There is mildly elevated pulmonary artery  systolic pressure.   3. Left atrial size was moderately dilated.   4. Right atrial size was mildly dilated.   5. The mitral valve is normal in structure. Mild mitral valve  regurgitation.   6. The aortic valve is tricuspid. Aortic valve regurgitation is mild.  Mild aortic valve sclerosis is present, with no evidence of aortic valve  stenosis.   7. The inferior vena cava is normal in size with greater than 50%  respiratory variability, suggesting right atrial pressure of 3 mmHg.   SPECT 11/08/18 The left ventricular ejection fraction is normal (55-65%). Nuclear stress EF: 56%. Post CABG septal wall hypokinesis. There was no ST segment deviation noted during stress. There is mildly reduced radiotracer uptake at both  rest and stress in the anterior, lateral and inferolateral distributions. No ischemia identified. (POST CABG) This is a low risk study.  ASSESSMENT AND PLAN:  1.  Persistent atrial fibrillation: Currently  on Eliquis 5 mg twice daily.  CHA2DS2-VASc of 3.  Was taken off of amiodarone.  He had an ablation 03/19/2020.  He has required cardioversion since his ablation but has overall done well.  He is happy with his control.  No changes.  2.  Coronary artery disease: Status post CABG.  He has an occluded radial to the OM.  No current chest pain.  3.  Hypertension: Currently well controlled  4.  Hyperlipidemia: Continue Repatha per lipid clinic  5.  Obstructive sleep apnea: Treated with a neurostimulator.  Happy with his overall control.  Current medicines are reviewed at length with the patient today.   The patient does not have concerns regarding his medicines.  The following changes were made today: None  Labs/ tests ordered today include:  Orders Placed This Encounter  Procedures   EKG 12-Lead     Disposition:   FU with Liese Dizdarevic 6 months  Signed, Gertude Benito Meredith Leeds, MD  10/14/2021 9:59 AM     [facility] [address] [place] [address] [phone] (office) [phone] (fax)

## 2021-10-26 ENCOUNTER — Telehealth: Payer: Self-pay | Admitting: Cardiovascular Disease

## 2021-10-26 NOTE — Telephone Encounter (Signed)
° °  Pre-operative Risk Assessment    Patient Name: [name] Brown  DOB: March 18, 1951 MRN: [id number]     Request for Surgical Clearance    Procedure:  Dental Extraction - Amount of Teeth to be Pulled:  7  Date of Surgery:  Clearance 11/09/21                                 Surgeon:  Dr. Posey Pronto Surgeon's Group or Practice Name:  [facility]  Phone number:  [phone] Fax number:  [phone]   Type of Clearance Requested:   - Pharmacy:  Hold blood thinners if suggested by cardiologist      Type of Anesthesia:  Local    Additional requests/questions:   n/a  Signed, Kamira J Martinique   10/26/2021, 10:10 AM

## 2021-10-27 NOTE — Telephone Encounter (Signed)
° °  Primary Cardiologist: Jenkins Rouge, MD  Chart reviewed as part of pre-operative protocol coverage. Given past medical history and time since last visit, based on ACC/AHA guidelines, [name] Brown would be at acceptable risk for the planned procedure without further cardiovascular testing.   Patient with diagnosis of Afib on Xarelto for anticoagulation.     Procedure: Dental Extraction of 7 Teeth Date of procedure: 11/09/2021   CHA2DS2-VASc Score = 3  This indicates a 3.2% annual risk of stroke. The patient's score is based upon: CHF History: 0 HTN History: 1 Diabetes History: 0 Stroke History: 0 Vascular Disease History: 1 Age Score: 1 Gender Score: 0   CrCl 76 ml/min Platelet count 217K   Patient does not require pre-op antibiotics for dental procedure.   Per office protocol, patient can hold Xarelto for 1 day prior to procedure  I will route this recommendation to the requesting party via [facility] fax function and remove from pre-op pool.  Please call with questions.  Jossie Ng. Kou Gucciardo NP-C    10/27/2021, 12:12 PM [place] [address] Office [phone] Fax [phone]

## 2021-10-27 NOTE — Telephone Encounter (Signed)
Patient with diagnosis of Afib on Xarelto for anticoagulation.    Procedure: Dental Extraction of 7 Teeth Date of procedure: 11/09/2021  CHA2DS2-VASc Score = 3  This indicates a 3.2% annual risk of stroke. The patient's score is based upon: CHF History: 0 HTN History: 1 Diabetes History: 0 Stroke History: 0 Vascular Disease History: 1 Age Score: 1 Gender Score: 0  CrCl 76 ml/min Platelet count 217K  Patient does not require pre-op antibiotics for dental procedure.  Per office protocol, patient can hold Xarelto for 1 day prior to procedure.

## 2021-10-29 ENCOUNTER — Telehealth: Payer: PPO

## 2021-11-15 ENCOUNTER — Other Ambulatory Visit: Payer: Self-pay | Admitting: Cardiovascular Disease

## 2021-11-15 DIAGNOSIS — I10 Essential (primary) hypertension: Secondary | ICD-10-CM

## 2021-11-15 NOTE — Progress Notes (Signed)
Date:  03/18/2021   ID:  [name] Brown, DOB 1951/05/05, MRN [id number] The patient was identified using 2 identifiers.  PCP:  Hoyt Koch, MD  Cardiologist:  Jenkins Rouge, MD  Sleep Medicine:  Leanora Cover, MD Electrophysiologist:  Will Meredith Leeds, MD   Chief Complaint:  OSA  History of Present Illness:    [name] Brown is a 71 y.o. male with a hx of afib, SSS, CAD.  He has a hx of OSA with home sleep study documenting this in 2018.  He was placed on CPAP but at that time did not tolerate CPAP therapy.  He was using a nasal mask or nasal pillow mask but it bothered him so much he could not use it.  He was referred to Dr. Ron Parker for an oral device but apparently needed implants put in because he does not have enough teeth but it was cost prohibitive.   He decided to retry CPAP and underwent CPAP tiration in 08/2019.  Due to ongoing respiratory events he could not be adequately titrated on CPAP and was changed to BiPAP.  He again could not tolerate BiPAP.   Due to problems with excessive daytime sleepiness he was referred to ENT to be evaluated for left hypoglossal nerve stimulator.    He underwent Hypoglossal N stimulator placement 07/30/2020 and underwent device activation. He  was seen back in office and was doing well with his device and was at level 6.  He underwent titration in the sleep lab but was inadequate due to ongoing respiratory events.  He was seen back in clinic 4 weeks ago and his functional threshold had decreased to 1.3V likely related to healing around the nerve and his range was changed to 1.7-2.7.    At last office visit he was doing well with his device and using it nightly. He was on level 6-7 which was 2.3V.  He was supposed to have a home sleep study but when he did the sleep study both times he had difficulty maneuvering the device and therefore the sleep study was not performed.  He is back today and doing quite well.  He is sleeping on average  5.6 hours a day.  Which is much more than he had been.  He feels rested in the morning and does not really have any significant daytime sleepiness.  He has no abnormal tongue sensations, pain or jaw pain.  He is currently on level 8 which is 2.4 V.  Prior CV studies:   The following studies were reviewed today:   Inspire titration  Past Medical History:  Diagnosis Date   Aortic aneurysm [facility]) Endograf 2009   Arthritis    Atrial fibrillation (HCC)    Bradycardia    CAD (coronary artery disease) CABG 03/2000   Median sternotomy for coronary artery bypass grafting x 3 (left   HTN (hypertension)    Hx of CABG 2001   severe 3 vessel disease   Hx of colonic polyp 06/2010   Hyperplastic    Hyperlipidemia    Myocardial infarction [facility])    Sleep apnea    Past Surgical History:  Procedure Laterality Date   ATRIAL FIBRILLATION ABLATION N/A 03/19/2020   Procedure: ATRIAL FIBRILLATION ABLATION;  Surgeon: Constance Haw, MD;  Location: [place] CV LAB;  Service: Cardiovascular;  Laterality: N/A;   CARDIOVERSION N/A 11/28/2018   Procedure: CARDIOVERSION;  Surgeon: Elouise Munroe, MD;  Location: [place];  Service: Cardiovascular;  Laterality: N/A;   CARDIOVERSION N/A 01/13/2021   Procedure: CARDIOVERSION;  Surgeon: Pixie Casino, MD;  Location: [facility] ENDOSCOPY;  Service: Cardiovascular;  Laterality: N/A;   COLONOSCOPY     CORONARY ARTERY BYPASS GRAFT     AAA stent graft on 12/01/2007 at [facility]  coronary bypass in 2001. Dr. Roxy Manns.   DRUG INDUCED ENDOSCOPY Right 02/18/2020   Procedure: DRUG INDUCED ENDOSCOPY;  Surgeon: Melida Quitter, MD;  Location: [place];  Service: ENT;  Laterality: Right;   FOOT SURGERY Left 07/19/2017   IMPLANTATION OF HYPOGLOSSAL NERVE STIMULATOR Right 07/30/2020   Procedure: IMPLANTATION OF HYPOGLOSSAL NERVE STIMULATOR Implantation of Chest wall respirator Sensor Electrode Electronic Analysis of Implanted Neurostimulator  Pulse Generator;  Surgeon: Melida Quitter, MD;  Location: [place];  Service: ENT;  Laterality: Right;   LEFT HEART CATHETERIZATION WITH CORONARY ANGIOGRAM N/A 10/26/2013   Procedure: LEFT HEART CATHETERIZATION WITH CORONARY ANGIOGRAM;  Surgeon: Peter M Martinique, MD;  Location: [facility] CATH LAB;  Service: Cardiovascular;  Laterality: N/A;   MASS EXCISION Right 08/10/2019   Procedure: EXCISION OF CYST FROM RIGHT SCALP;  Surgeon: Jovita Kussmaul, MD;  Location: [place];  Service: General;  Laterality: Right;     Current Meds  Medication Sig   albuterol (VENTOLIN HFA) 108 (90 Base) MCG/ACT inhaler INHALE 2 PUFFS INTO THE LUNGS EVERY 6 HOURS AS NEEDED FOR WHEEZING OR SHORTNESS OF BREATH   aspirin EC 81 MG EC tablet Take 1 tablet (81 mg total) by mouth daily.   Biotin 10000 MCG TABS Take 10,000 mcg by mouth in the morning.   cholecalciferol (VITAMIN D) 25 MCG (1000 UT) tablet Take 1,000 Units by mouth every evening.   diclofenac sodium (VOLTAREN) 1 % GEL Apply 2 g topically 4 (four) times daily.   divalproex (DEPAKOTE ER) 500 MG 24 hr tablet TAKE 1 TABLET(500 MG) BY MOUTH DAILY   doxycycline (VIBRAMYCIN) 50 MG capsule Take 50 mg by mouth every evening.   Evolocumab (REPATHA SURECLICK) 166 MG/ML SOAJ INJECT 1 PEN INTO THE SKIN EVERY 14 DAYS   ezetimibe (ZETIA) 10 MG tablet Take 1 tablet (10 mg total) by mouth daily.   furosemide (LASIX) 20 MG tablet TAKE 1 TABLET(20 MG) BY MOUTH TWICE DAILY   gabapentin (NEURONTIN) 300 MG capsule TAKE 1 CAPSULE(300 MG) BY MOUTH TWICE DAILY   Glucosamine-Chondroitin (GLUCOSAMINE CHONDR COMPLEX PO) Take 500 mg by mouth 2 (two) times daily.    guaiFENesin (MUCINEX) 600 MG 12 hr tablet Take 600 mg by mouth 2 (two) times daily.   hydrALAZINE (APRESOLINE) 25 MG tablet Take 1 tablet (25 mg total) by mouth in the morning and at bedtime.   isosorbide mononitrate (IMDUR) 30 MG 24 hr tablet TAKE 1 TABLET BY MOUTH EVERY DAY   montelukast (SINGULAIR) 10  MG tablet Take 1 tablet (10 mg total) by mouth at bedtime.   Multiple Vitamin (MULTIVITAMIN WITH MINERALS) TABS tablet Take 1 tablet by mouth every evening.   nitroGLYCERIN (NITROSTAT) 0.4 MG SL tablet DISSOLVE 1 TABLET UNDER THE TONGUE EVERY 5 MINUTES FOR 3 DOSES AS NEEDED FOR CHEST PAIN   Omega-3 Fatty Acids (FISH OIL) 1200 MG CAPS Take 1,200 mg by mouth 2 (two) times daily.   potassium chloride SA (KLOR-CON) 20 MEQ tablet TAKE 1 TABLET BY MOUTH EVERY DAY   rivaroxaban (XARELTO) 20 MG TABS tablet Take 1 tablet (20 mg total) by mouth daily with supper.   tamsulosin (FLOMAX) 0.4 MG  CAPS capsule TAKE 1 CAPSULE(0.4 MG) BY MOUTH DAILY   tiZANidine (ZANAFLEX) 4 MG tablet Take 1 tablet (4 mg total) by mouth every 8 (eight) hours as needed for muscle spasms.   vitamin C (ASCORBIC ACID) 500 MG tablet Take 500 mg by mouth daily.   zolpidem (AMBIEN CR) 12.5 MG CR tablet TAKE 1 TABLET(12.5 MG) BY MOUTH AT BEDTIME AS NEEDED FOR SLEEP   [DISCONTINUED] ramipril (ALTACE) 10 MG capsule Take 2 capsules (20 mg total) by mouth daily.     Allergies:   Statins   Social History   Tobacco Use   Smoking status: Former    Packs/day: 2.00    Years: 30.00    Pack years: 60.00    Types: Cigarettes    Quit date: 01/05/1992    Years since quitting: 29.8   Smokeless tobacco: Never   Tobacco comments:    quit 19 yrs ago  Vaping Use   Vaping Use: Never used  Substance Use Topics   Alcohol use: No   Drug use: Not Currently    Types: Marijuana     Family Hx: The patient's family history includes Heart attack in his father and mother; Heart disease in his father and mother. There is no history of Rectal cancer, Stomach cancer, Pancreatic cancer, Esophageal cancer, Colon polyps, or Colon cancer.  ROS:   Please see the history of present illness.     All other systems reviewed and are negative.   Labs/Other Tests and Data Reviewed:    Recent Labs: 12/25/2020: BUN 23; Creatinine, Ser 1.18; Potassium 4.5;  Sodium 142 02/04/2021: ALT 13 07/15/2021: Hemoglobin 13.4; Platelets 230.0   Recent Lipid Panel Lab Results  Component Value Date/Time   CHOL 109 02/04/2021 07:47 AM   TRIG 78 02/04/2021 07:47 AM   HDL 34 (L) 02/04/2021 07:47 AM   CHOLHDL 3.2 02/04/2021 07:47 AM   CHOLHDL 4.8 11/29/2018 02:23 AM   LDLCALC 59 02/04/2021 07:47 AM    Wt Readings from Last 3 Encounters:  11/16/21 193 lb 3.2 oz (87.6 kg)  10/14/21 200 lb 12.8 oz (91.1 kg)  07/20/21 201 lb 12.8 oz (91.5 kg)     Objective:    Vital Signs:  BP (!) 144/78    Pulse 75    Ht 5\' 10"  (1.778 m)    Wt 193 lb 3.2 oz (87.6 kg)    SpO2 95%    BMI 27.72 kg/m    GEN: Well nourished, well developed in no acute distress HEENT: Normal NECK: No JVD; No carotid bruits LYMPHATICS: No lymphadenopathy CARDIAC:RRR, no murmurs, rubs, gallops RESPIRATORY:  Clear to auscultation without rales, wheezing or rhonchi  ABDOMEN: Soft, non-tender, non-distended MUSCULOSKELETAL:  No edema; No deformity  SKIN: Warm and dry NEUROLOGIC:  Alert and oriented x 3 PSYCHIATRIC:  Normal affect   ASSESSMENT & PLAN:    1.  OSA -he has had a sleep study in 2018 and was tried on CPAP but he did not tolerate the PAP both in 2018 and on retry in 2021. -the oral device was cost prohibitive due to needing major dental work prior to getting the device so that is not an option -now s/p hypoglossal N stimulator -Initial parameters were set as follows:  Amplitude 2.3V  Patient control 2.3-3.3V  Pulse width 70mcs  Rate 33Hz   Start delay 30 min  Pause time 15 min  Therapy duration 8 hours  Electrodes +/-/+ -he had a titration in sleep lab done in March 2022 and titration  was inadequate due to ongoing respiratory events at peak Voltage of 3.6V.   -his device was interrogated in office 4 weeks ago and his functional threshold had significantly decreased to 1.3V likely related to healing around the nerve where the lead was implanted and the device was reprogrammed  to a range of 1.7 to 2.7V -he is doing well today with his device and is now on level 8 which is 2.4 V -I am going to order a MR home sleep study which I think will be easier for him to maneuver.  This will be to make sure that his apneas are under control. -We will change his range to 2.2 to 2.7 V.  2.  HTN -His BP is well controlled on exam today. -Continue prescription drug management with Hydralazine 25mg  BID, Ramipril 20mg  daily and Imdur 30mg  daily -I have personally reviewed and interpreted outside labs performed by patient's PCP which showed SCr 1.18, K+ 4.5 and Hbg 12.4 in March 2022   COVID-19 Education: The signs and symptoms of COVID-19 were discussed with the patient and how to seek care for testing (follow up with PCP or arrange [id number]).  The importance of social distancing was discussed today.  Time:   Today, I have spent 10 minutes with the patient with telehealth technology discussing the above problems as well as another 10 minutes reviewing his labs from PCP in March and prior sleep study and Inspire titration.   Medication Adjustments/Labs and Tests Ordered: Current medicines are reviewed at length with the patient today.  Concerns regarding medicines are outlined above.  Tests Ordered: Orders Placed This Encounter  Procedures   [facility] Sleep Study   Medication Changes: No orders of the defined types were placed in this encounter.   Disposition: Set up for in lab sleep study with Inspire titration  Signed, Fransico Him, MD  11/17/2021 8:43 AM    [place]

## 2021-11-16 ENCOUNTER — Other Ambulatory Visit: Payer: Self-pay

## 2021-11-16 ENCOUNTER — Encounter: Payer: Self-pay | Admitting: Cardiology

## 2021-11-16 ENCOUNTER — Ambulatory Visit: Payer: PPO | Admitting: Cardiology

## 2021-11-16 VITALS — BP 144/78 | HR 75 | Ht 70.0 in | Wt 193.2 lb

## 2021-11-16 DIAGNOSIS — G4733 Obstructive sleep apnea (adult) (pediatric): Secondary | ICD-10-CM

## 2021-11-16 DIAGNOSIS — I10 Essential (primary) hypertension: Secondary | ICD-10-CM

## 2021-11-16 NOTE — Progress Notes (Signed)
Patient Name:         DOB:       Height:     Weight:  Office Name:         Referring Provider:  Today's Date:  Date:   STOP BANG RISK ASSESSMENT S (snore) Have you been told that you snore?     YES/NO y  T (tired) Are you often tired, fatigued, or sleepy during the day?   YES/NO n  O (obstruction) Do you stop breathing, choke, or gasp during sleep? YES/NO n   P (pressure) Do you have or are you being treated for high blood pressure? YES/NO y  B (BMI) Is your body index greater than 35 kg/m? YES/NO n  A (age) Are you 70 years old or older? YES/NO y  N (neck) Do you have a neck circumference greater than 16 inches?   YES/NO y  G (gender) Are you a male? YES/NO y  TOTAL STOP/BANG YES ANSWERS    STOP BANG SCORE = 5                                                                     For Office Use Only              Procedure Order Form    YES to 3+ Stop Bang questions OR two clinical symptoms - patient qualifies for WatchPAT (CPT 95800)             Clinical Notes: Will consult Sleep Specialist and refer for management of therapy due to patient increased risk of Sleep Apnea. Ordering a sleep study due to the following two clinical symptoms: Excessive daytime sleepiness G47.10 / Gastroesophageal reflux K21.9 / Nocturia R35.1 / Morning Headaches G44.221 / Difficulty concentrating R41.840 / Memory problems or poor judgment G31.84 / Personality changes or irritability R45.4 / Loud snoring R06.83 / Depression F32.9 / Unrefreshed by sleep G47.8 / Impotence N52.9 / History of high blood pressure R03.0 / Insomnia G47.00    I understand that I am proceeding with a home sleep apnea test as ordered by my treating physician. I understand that untreated sleep apnea is a serious cardiovascular risk factor and it is my responsibility to perform the test and seek management for sleep apnea. I will be contacted with the results and be managed for sleep apnea by a local sleep physician. I will  be receiving equipment and further instructions from [facility]. I shall promptly ship back the equipment via the included mailing label. I understand my insurance will be billed for the test and as the patient I am responsible for any insurance related out-of-pocket costs incurred. I have been provided with written instructions and can call for additional video or telephonic instruction, with 24-hour availability of qualified personnel to answer any questions: Patient Help Desk [phone].  Patient Signature ______________________________________________________   Date______________________ Patient Telemedicine Verbal Consent

## 2021-11-16 NOTE — Patient Instructions (Addendum)
Medication Instructions:  Your physician recommends that you continue on your current medications as directed. Please refer to the Current Medication list given to you today.  *If you need a refill on your cardiac medications before your next appointment, please call your pharmacy*  Testing/Procedures: Your physician has recommended that you have a sleep study. This test records several body functions during sleep, including: brain activity, eye movement, oxygen and carbon dioxide blood levels, heart rate and rhythm, breathing rate and rhythm, the flow of air through your mouth and nose, snoring, body muscle movements, and chest and belly movement.  Follow-Up: At [facility], you and your health needs are our priority.  As part of our continuing mission to provide you with exceptional heart care, we have created designated Provider Care Teams.  These Care Teams include your primary Cardiologist (physician) and Advanced Practice Providers (APPs -  Physician Assistants and Nurse Practitioners) who all work together to provide you with the care you need, when you need it.  Follow up after sleep study.  This

## 2021-11-17 ENCOUNTER — Telehealth (HOSPITAL_BASED_OUTPATIENT_CLINIC_OR_DEPARTMENT_OTHER): Payer: Self-pay | Admitting: *Deleted

## 2021-11-17 NOTE — Telephone Encounter (Signed)
-----   Message from Lauralee Evener, [place] sent at 11/17/2021  2:37 PM EST ----- No PA is required ----- Message ----- From: Jeanann Lewandowsky, RMA Sent: 11/16/2021   9:38 AM EST To: [name], CMA, Cv Div Sleep Studies  Pt has been set up for [name] Brown sleep study, please get authorization.   Thanks!

## 2021-11-17 NOTE — Telephone Encounter (Signed)
Call placed to pt.  He has been made aware that no P/A was required for the [facility] Sleep Study. Pt was provided with the pin # and has been made aware he can proceed with the study.  Pt verbalized understanding.

## 2021-11-18 ENCOUNTER — Encounter (INDEPENDENT_AMBULATORY_CARE_PROVIDER_SITE_OTHER): Payer: PPO | Admitting: Cardiology

## 2021-11-18 DIAGNOSIS — G4733 Obstructive sleep apnea (adult) (pediatric): Secondary | ICD-10-CM

## 2021-11-19 NOTE — Procedures (Signed)
° °  Sleep Study Report  Patient Information Study Date: 11/18/21 Patient Name: [name] Brown Patient ID: [id number] Birth Date: Jul 11, 2051 Age: 71 Gender: Male BMI: 27.8 (W=194 lb, H=5' 10'') Referring Physician: Fransico Him, MD  TEST DESCRIPTION: Home sleep apnea testing was completed using the WatchPat, a Type 1 device, utilizing peripheral arterial tonometry (PAT), chest movement, actigraphy, pulse oximetry, pulse rate, body position and snore. AHI was calculated with apnea and hypopnea using valid sleep time as the denominator. RDI includes apneas, hypopneas, and RERAs. The data acquired and the scoring of sleep and all associated events were performed in accordance with the recommended standards and specifications as outlined in the AASM Manual for the Scoring of Sleep and Associated Events 2.2.0 (2015).  FINDINGS: 1. Moderate Obstructive Sleep Apnea with AHI 22.3/hr. 2. Mild Central Sleep Apnea with pAHIc 7.6/hr with 10.1% Cheyne Stokes Respirations. 3. Oxygen desaturations as low as 82%. 4. Moderate snoring was present. O2 sats were < 88% for 19.2 min. 5. Total sleep time was 4 hrs and 50 min. 6. 17.7% of total sleep time was spent in REM sleep. 7. Shortened sleep onset latency at 6 min 8. Prolonged REM sleep onset latency at 161 min. 9. Total awakenings were 15.  DIAGNOSIS: Moderate Obstructive Sleep Apnea (G47.33)  RECOMMENDATIONS: 1. Clinical correlation of these findings is necessary. The decision to treat obstructive sleep apnea (OSA) is usually based on the presence of apnea symptoms or the presence of associated medical conditions such as Hypertension, Congestive Heart Failure, Atrial Fibrillation or Obesity. The most common symptoms of OSA are snoring, gasping for breath while sleeping, daytime sleepiness and fatigue.  2. Initiating apnea therapy is recommended given the presence of symptoms and/or associated conditions. Recommend proceeding with one of the  following:   a. Auto-CPAP therapy with a pressure range of 5-20cm H2O.   b. An oral appliance (OA) that can be obtained from certain dentists with expertise in sleep medicine. These are primarily of use in non-obese patients with mild and moderate disease.   c. An ENT consultation which may be useful to look for specific causes of obstruction and possible treatment options.   d. If patient is intolerant to PAP therapy, consider referral to ENT for evaluation for hypoglossal nerve stimulator.  3. Close follow-up is necessary to ensure success with CPAP or oral appliance therapy for maximum benefit .  4. A follow-up oximetry study on CPAP is recommended to assess the adequacy of therapy and determine the need for supplemental oxygen or the potential need for Bi-level therapy. An arterial blood gas to determine the adequacy of baseline ventilation and oxygenation should also be considered.  5. Healthy sleep recommendations include: adequate nightly sleep (normal 7-9 hrs/night), avoidance of caffeine after noon and alcohol near bedtime, and maintaining a sleep environment that is cool, dark and quiet.  6. Weight loss for overweight patients is recommended. Even modest amounts of weight loss can significantly improve the severity of sleep apnea.  7. Snoring recommendations include: weight loss where appropriate, side sleeping, and avoidance of alcohol before bed.  8. Operation of motor vehicle should not be performed when sleepy.  Signature: Electronically Signed: 11/19/21 Fransico Him, MD; [facility]; [place], American Board of Sleep Medicine

## 2021-11-19 NOTE — Telephone Encounter (Addendum)
OK TO SCHEDULE  PER HTA NO PA REQUIRED FOR HST  [name] Brown, [name] Brown, RMA  P Cv Div Sleep Studies; [name], CMA Pt has been set up for [facility] sleep study, please get authorization.

## 2021-11-23 ENCOUNTER — Ambulatory Visit: Payer: PPO

## 2021-11-23 ENCOUNTER — Telehealth: Payer: Self-pay | Admitting: *Deleted

## 2021-11-23 DIAGNOSIS — G4733 Obstructive sleep apnea (adult) (pediatric): Secondary | ICD-10-CM

## 2021-11-23 NOTE — Telephone Encounter (Signed)
The patient has been notified of the result and verbalized understanding.  All questions (if any) were answered. Marolyn Hammock, [place] 11/23/2021 5:19 PM

## 2021-11-23 NOTE — Telephone Encounter (Signed)
-----   Message from Sueanne Margarita, MD sent at 11/19/2021  4:29 PM EST ----- Patient still has significant OSA despite Inspire device - please call Inpsire rep as patient needs to go to sleep lab for titration to adjust device

## 2021-11-25 NOTE — Telephone Encounter (Signed)
Orders have been placed for Inspire Titration.

## 2021-11-25 NOTE — Progress Notes (Signed)
CARDIOLOGY CONSULT NOTE       Patient ID: [name] Brown MRN: [id number] DOB/AGE: March 13, 1951 71 y.o.   Primary Physician: Hoyt Koch, MD Primary Cardiologist: Johnsie Cancel Reason for Consultation: Curt Bears  Active Problems:   * No active hospital problems. *    HPI:  71 y.o. with history of CAD/CABG distant LIMA to LAD, RIMA to PDA left radial to OM1. Cath 2015 with occluded radial to OM native vessel only 30-40% stenosed. Normal myovue 11/08/18.  PVD followed by Dr Doren Custard with EVAR  Duplex 05/10/18 no residual AAA or endoleak. 58-85% LICA stenosis by duplex 01/22/21 TTE done 01/21/20 with EF 60-65% moderate LAE, mild AR/MR.    Afib noted in 2018 CHADVASC 3 on eliquis Afib ablation June 2021 and amiodarone d/c post procedure Required [facility] for recurrence 01/13/21. Seen by Dr Curt Bears 10/14/21  stable no AAT added back   Sees Dr Radford Pax for OSA Intolerant to CPAP/Bipap.  Had hypoglossal N stimulator placement 07/30/20 Settings titrated recently   Getting GERD with eliquis has been on H2 blocker twice with symptoms returning when he stops Changed to [place] 04/22/21   No angina compliant with meds  Some right shoulder/neck arthritis  ROS All other systems reviewed and negative except as noted above  Past Medical History:  Diagnosis Date   Aortic aneurysm [facility]) Endograf 2009   Arthritis    Atrial fibrillation (HCC)    Bradycardia    CAD (coronary artery disease) CABG 03/2000   Median sternotomy for coronary artery bypass grafting x 3 (left   HTN (hypertension)    Hx of CABG 2001   severe 3 vessel disease   Hx of colonic polyp 06/2010   Hyperplastic    Hyperlipidemia    Myocardial infarction [facility])    Sleep apnea     Family History  Problem Relation Age of Onset   Heart attack Mother    Heart disease Mother    Heart attack Father    Heart disease Father    Rectal cancer Neg Hx    Stomach cancer Neg Hx    Pancreatic cancer Neg Hx    Esophageal cancer Neg Hx    Colon  polyps Neg Hx    Colon cancer Neg Hx     Social History   Socioeconomic History   Marital status: Married    Spouse name: Not on file   Number of children: Not on file   Years of education: Not on file   Highest education level: Not on file  Occupational History   Not on file  Tobacco Use   Smoking status: Former    Packs/day: 2.00    Years: 30.00    Pack years: 60.00    Types: Cigarettes    Quit date: 01/05/1992    Years since quitting: 29.9   Smokeless tobacco: Never   Tobacco comments:    quit 19 yrs ago  Vaping Use   Vaping Use: Never used  Substance and Sexual Activity   Alcohol use: No   Drug use: Not Currently    Types: Marijuana   Sexual activity: Not on file  Other Topics Concern   Not on file  Social History Narrative   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   Social Determinants of Health   Financial Resource Strain: Medium Risk   Difficulty of Paying Living Expenses: Somewhat hard  Food Insecurity: Not on file  Transportation Needs: Not on file  Physical  Activity: Not on file  Stress: Not on file  Social Connections: Not on file  Intimate Partner Violence: Not on file    Past Surgical History:  Procedure Laterality Date   ATRIAL FIBRILLATION ABLATION N/A 03/19/2020   Procedure: ATRIAL FIBRILLATION ABLATION;  Surgeon: Constance Haw, MD;  Location: [place] CV LAB;  Service: Cardiovascular;  Laterality: N/A;   CARDIOVERSION N/A 11/28/2018   Procedure: CARDIOVERSION;  Surgeon: Elouise Munroe, MD;  Location: [facility] ENDOSCOPY;  Service: Cardiovascular;  Laterality: N/A;   CARDIOVERSION N/A 01/13/2021   Procedure: CARDIOVERSION;  Surgeon: Pixie Casino, MD;  Location: [facility] ENDOSCOPY;  Service: Cardiovascular;  Laterality: N/A;   COLONOSCOPY     CORONARY ARTERY BYPASS GRAFT     AAA stent graft on 12/01/2007 at [facility]  coronary bypass in 2001. Dr. Roxy Manns.   DRUG INDUCED ENDOSCOPY Right 02/18/2020    Procedure: DRUG INDUCED ENDOSCOPY;  Surgeon: Melida Quitter, MD;  Location: [place];  Service: ENT;  Laterality: Right;   FOOT SURGERY Left 07/19/2017   IMPLANTATION OF HYPOGLOSSAL NERVE STIMULATOR Right 07/30/2020   Procedure: IMPLANTATION OF HYPOGLOSSAL NERVE STIMULATOR Implantation of Chest wall respirator Sensor Electrode Electronic Analysis of Implanted Neurostimulator Pulse Generator;  Surgeon: Melida Quitter, MD;  Location: [place];  Service: ENT;  Laterality: Right;   LEFT HEART CATHETERIZATION WITH CORONARY ANGIOGRAM N/A 10/26/2013   Procedure: LEFT HEART CATHETERIZATION WITH CORONARY ANGIOGRAM;  Surgeon: Charm Stenner M Martinique, MD;  Location: [facility] CATH LAB;  Service: Cardiovascular;  Laterality: N/A;   MASS EXCISION Right 08/10/2019   Procedure: EXCISION OF CYST FROM RIGHT SCALP;  Surgeon: Autumn Messing III, MD;  Location: [place];  Service: General;  Laterality: Right;      Current Outpatient Medications:    albuterol (VENTOLIN HFA) 108 (90 Base) MCG/ACT inhaler, INHALE 2 PUFFS INTO THE LUNGS EVERY 6 HOURS AS NEEDED FOR WHEEZING OR SHORTNESS OF BREATH, Disp: 54 g, Rfl: 1   aspirin EC 81 MG EC tablet, Take 1 tablet (81 mg total) by mouth daily., Disp: , Rfl:    Biotin 10000 MCG TABS, Take 10,000 mcg by mouth in the morning., Disp: , Rfl:    cholecalciferol (VITAMIN D) 25 MCG (1000 UT) tablet, Take 1,000 Units by mouth every evening., Disp: , Rfl:    diclofenac sodium (VOLTAREN) 1 % GEL, Apply 2 g topically 4 (four) times daily., Disp: 100 g, Rfl: 3   divalproex (DEPAKOTE ER) 500 MG 24 hr tablet, TAKE 1 TABLET(500 MG) BY MOUTH DAILY, Disp: 90 tablet, Rfl: 1   doxycycline (VIBRAMYCIN) 50 MG capsule, Take 50 mg by mouth every evening., Disp: , Rfl:    Evolocumab (REPATHA SURECLICK) 193 MG/ML SOAJ, INJECT 1 PEN INTO THE SKIN EVERY 14 DAYS, Disp: 6 mL, Rfl: 1   ezetimibe (ZETIA) 10 MG tablet, Take 1 tablet (10 mg total) by mouth daily., Disp: 90  tablet, Rfl: 3   furosemide (LASIX) 20 MG tablet, TAKE 1 TABLET(20 MG) BY MOUTH TWICE DAILY, Disp: 180 tablet, Rfl: 3   gabapentin (NEURONTIN) 300 MG capsule, TAKE 1 CAPSULE(300 MG) BY MOUTH TWICE DAILY, Disp: 180 capsule, Rfl: 2   Glucosamine-Chondroitin (GLUCOSAMINE CHONDR COMPLEX PO), Take 500 mg by mouth 2 (two) times daily. , Disp: , Rfl:    guaiFENesin (MUCINEX) 600 MG 12 hr tablet, Take 600 mg by mouth 2 (two) times daily., Disp: , Rfl:    hydrALAZINE (APRESOLINE) 25 MG tablet, Take 1 tablet (25  mg total) by mouth in the morning and at bedtime., Disp: 270 tablet, Rfl: 1   isosorbide mononitrate (IMDUR) 30 MG 24 hr tablet, TAKE 1 TABLET BY MOUTH EVERY DAY, Disp: 90 tablet, Rfl: 3   montelukast (SINGULAIR) 10 MG tablet, Take 1 tablet (10 mg total) by mouth at bedtime., Disp: 90 tablet, Rfl: 3   Multiple Vitamin (MULTIVITAMIN WITH MINERALS) TABS tablet, Take 1 tablet by mouth every evening., Disp: , Rfl:    nitroGLYCERIN (NITROSTAT) 0.4 MG SL tablet, DISSOLVE 1 TABLET UNDER THE TONGUE EVERY 5 MINUTES FOR 3 DOSES AS NEEDED FOR CHEST PAIN, Disp: 25 tablet, Rfl: 4   Omega-3 Fatty Acids (FISH OIL) 1200 MG CAPS, Take 1,200 mg by mouth 2 (two) times daily., Disp: , Rfl:    potassium chloride SA (KLOR-CON) 20 MEQ tablet, TAKE 1 TABLET BY MOUTH EVERY DAY, Disp: 90 tablet, Rfl: 3   ramipril (ALTACE) 10 MG capsule, TAKE 2 CAPSULES(20 MG) BY MOUTH DAILY, Disp: 180 capsule, Rfl: 1   rivaroxaban (XARELTO) 20 MG TABS tablet, Take 1 tablet (20 mg total) by mouth daily with supper., Disp: 90 tablet, Rfl: 3   tamsulosin (FLOMAX) 0.4 MG CAPS capsule, TAKE 1 CAPSULE(0.4 MG) BY MOUTH DAILY, Disp: 90 capsule, Rfl: 3   tiZANidine (ZANAFLEX) 4 MG tablet, Take 1 tablet (4 mg total) by mouth every 8 (eight) hours as needed for muscle spasms., Disp: 270 tablet, Rfl: 3   vitamin C (ASCORBIC ACID) 500 MG tablet, Take 500 mg by mouth daily., Disp: , Rfl:    zolpidem (AMBIEN CR) 12.5 MG CR tablet, TAKE 1 TABLET(12.5 MG) BY  MOUTH AT BEDTIME AS NEEDED FOR SLEEP, Disp: 30 tablet, Rfl: 5    Physical Exam:   Affect appropriate Healthy:  appears stated age 79: nerve stimulator present  Neck supple with no adenopathy JVP normal loud left bruits no thyromegaly Lungs clear with no wheezing and good diaphragmatic motion Heart:  S1/S2 SEM murmur, no rub, gallop or click PMI normal post sternotomy and left radial harvest  Abdomen: benighn, BS positve, no tenderness, no AAA no bruit.  No HSM or HJR Distal pulses intact with no bruits No edema Neuro non-focal Skin warm and dry No muscular weakness Inspiris generator under right clavicle    Labs:   Lab Results  Component Value Date   WBC 5.6 07/15/2021   HGB 13.4 07/15/2021   HCT 40.2 07/15/2021   MCV 96.3 07/15/2021   PLT 230.0 07/15/2021   No results for input(s): NA, K, CL, CO2, BUN, CREATININE, CALCIUM, PROT, BILITOT, ALKPHOS, ALT, AST, GLUCOSE in the last 168 hours.  Invalid input(s): LABALBU Lab Results  Component Value Date   CKTOTAL 102 02/21/2015   TROPONINI 0.06 (HH) 11/29/2018    Lab Results  Component Value Date   CHOL 109 02/04/2021   CHOL 194 11/04/2020   CHOL 114 11/29/2018   Lab Results  Component Value Date   HDL 34 (L) 02/04/2021   HDL 32 (L) 11/04/2020   HDL 24 (L) 11/29/2018   Lab Results  Component Value Date   LDLCALC 59 02/04/2021   LDLCALC 141 (H) 11/04/2020   LDLCALC 84 11/29/2018   Lab Results  Component Value Date   TRIG 78 02/04/2021   TRIG 114 11/04/2020   TRIG 32 11/29/2018   Lab Results  Component Value Date   CHOLHDL 3.2 02/04/2021   CHOLHDL 6.1 (H) 11/04/2020   CHOLHDL 4.8 11/29/2018   No results found for: LDLDIRECT  Radiology: No results found.  EKG: SR rate 68 normal 03/26/21    ASSESSMENT AND PLAN:   1:  CAD/CABG:  known occlusion of radial to OM with non obstructive native disease in this area Non ischemic myovue 11/08/18 continue medical RX 2. PVD:  f/u [facility]VS for EVAR [place] 05/27/21  no endoleak  3. PAF:  F/U [place] post ablation June 2021 Amiodarone d/c [facility] April 2022 In NSR Refulx better with Xarelto   4. HTN:  Well controlled.  Continue current medications and low sodium Dash type diet.   5. OSA:  with hypoglossal nerve stimulator f/u sleep study Dr Radford Pax to titrate settings better  6. HLD:  on zetia and repatha LDL 59 02/04/21 7. Carotid:  36-64% LICA stenosis f/u duplex April 2023    Carotid duplex 01/2022  F/U in a year   Signed: Jenkins Rouge 12/01/2021, 8:13 AM

## 2021-11-26 NOTE — Telephone Encounter (Signed)
Prior Authorization for inspre titr sent to [facility] via Fax . valid date 11-25-21  to 5-16 23  auth # [id number].

## 2021-11-26 NOTE — Progress Notes (Signed)
[name] Brown D.[place] Phone: [phone]   Assessment and Plan:     1. Neck pain 2. Somatic dysfunction of cervical region 3. Somatic dysfunction of thoracic region 4. Somatic dysfunction of rib region -Chronic with exacerbation, initial sports medicine visit - Unclear etiology of chronic and worsening neck pain with most likely etiology being cervical spine DDD versus irritation from foreign body with history of nerve stimulator implanted in 2021 - No red flag symptoms, so no new imaging taken today - Recommend Tylenol 500 to 1000 mg 2-3 times a day for day-to-day pain relief - Patient has received significant relief with OMT in the past.  Elects for repeat OMT today.  Tolerated well per note below. - Decision today to treat with OMT was based on Physical Exam  After verbal consent patient was treated with HVLA (high velocity low amplitude), ME (muscle energy), FPR (flex positional release), ST (soft tissue), PC/PD (Pelvic Compression/ Pelvic Decompression) techniques in cervical, rib, thoracic areas. Patient tolerated the procedure well with improvement in symptoms.  Patient educated on potential side effects of soreness and recommended to rest, hydrate, and use Tylenol as needed for pain control.    Pertinent previous records reviewed include x-ray neck soft tissue 07/30/2020   Follow Up: 3 weeks for reevaluation.  If no improvement or worsening of symptoms, would repeat C-spine x-ray   Subjective:   I, [name] Brown, am serving as a Education administrator for Doctor [name] Brown  Chief Complaint: right shoulder pain   HPI:  11/27/2021 Patient is a 71 year old male complaining of right shoulder pain. Patient states that is doing well pain starts on the pec and then goes into the neck will usually get him when hes doing any ROM , he will feel/ hear  a crunching sensation into his neck and stabbing sensation in his  pec that just started a few days ago, no MOI, has been going on for a year. Has been using tylenol and Voltaren gel no numbness tingling pain is concentrated in his neck and /shoulder pec area   Relevant Historical Information: History of C4 nerve stimulator implantation to right side of neck in 2021 related to poorly controlled OSA  Additional pertinent review of systems negative.   Current Outpatient Medications:    albuterol (VENTOLIN HFA) 108 (90 Base) MCG/ACT inhaler, INHALE 2 PUFFS INTO THE LUNGS EVERY 6 HOURS AS NEEDED FOR WHEEZING OR SHORTNESS OF BREATH, Disp: 54 g, Rfl: 1   aspirin EC 81 MG EC tablet, Take 1 tablet (81 mg total) by mouth daily., Disp: , Rfl:    Biotin 10000 MCG TABS, Take 10,000 mcg by mouth in the morning., Disp: , Rfl:    cholecalciferol (VITAMIN D) 25 MCG (1000 UT) tablet, Take 1,000 Units by mouth every evening., Disp: , Rfl:    diclofenac sodium (VOLTAREN) 1 % GEL, Apply 2 g topically 4 (four) times daily., Disp: 100 g, Rfl: 3   divalproex (DEPAKOTE ER) 500 MG 24 hr tablet, TAKE 1 TABLET(500 MG) BY MOUTH DAILY, Disp: 90 tablet, Rfl: 1   doxycycline (VIBRAMYCIN) 50 MG capsule, Take 50 mg by mouth every evening., Disp: , Rfl:    Evolocumab (REPATHA SURECLICK) 237 MG/ML SOAJ, INJECT 1 PEN INTO THE SKIN EVERY 14 DAYS, Disp: 6 mL, Rfl: 1   ezetimibe (ZETIA) 10 MG tablet, Take 1 tablet (10 mg total) by mouth daily., Disp: 90 tablet, Rfl: 3   furosemide (  LASIX) 20 MG tablet, TAKE 1 TABLET(20 MG) BY MOUTH TWICE DAILY, Disp: 180 tablet, Rfl: 3   gabapentin (NEURONTIN) 300 MG capsule, TAKE 1 CAPSULE(300 MG) BY MOUTH TWICE DAILY, Disp: 180 capsule, Rfl: 2   Glucosamine-Chondroitin (GLUCOSAMINE CHONDR COMPLEX PO), Take 500 mg by mouth 2 (two) times daily. , Disp: , Rfl:    guaiFENesin (MUCINEX) 600 MG 12 hr tablet, Take 600 mg by mouth 2 (two) times daily., Disp: , Rfl:    hydrALAZINE (APRESOLINE) 25 MG tablet, Take 1 tablet (25 mg total) by mouth in the morning and at  bedtime., Disp: 270 tablet, Rfl: 1   isosorbide mononitrate (IMDUR) 30 MG 24 hr tablet, TAKE 1 TABLET BY MOUTH EVERY DAY, Disp: 90 tablet, Rfl: 3   montelukast (SINGULAIR) 10 MG tablet, Take 1 tablet (10 mg total) by mouth at bedtime., Disp: 90 tablet, Rfl: 3   Multiple Vitamin (MULTIVITAMIN WITH MINERALS) TABS tablet, Take 1 tablet by mouth every evening., Disp: , Rfl:    nitroGLYCERIN (NITROSTAT) 0.4 MG SL tablet, DISSOLVE 1 TABLET UNDER THE TONGUE EVERY 5 MINUTES FOR 3 DOSES AS NEEDED FOR CHEST PAIN, Disp: 25 tablet, Rfl: 4   Omega-3 Fatty Acids (FISH OIL) 1200 MG CAPS, Take 1,200 mg by mouth 2 (two) times daily., Disp: , Rfl:    potassium chloride SA (KLOR-CON) 20 MEQ tablet, TAKE 1 TABLET BY MOUTH EVERY DAY, Disp: 90 tablet, Rfl: 3   ramipril (ALTACE) 10 MG capsule, TAKE 2 CAPSULES(20 MG) BY MOUTH DAILY, Disp: 180 capsule, Rfl: 1   rivaroxaban (XARELTO) 20 MG TABS tablet, Take 1 tablet (20 mg total) by mouth daily with supper., Disp: 90 tablet, Rfl: 3   tamsulosin (FLOMAX) 0.4 MG CAPS capsule, TAKE 1 CAPSULE(0.4 MG) BY MOUTH DAILY, Disp: 90 capsule, Rfl: 3   tiZANidine (ZANAFLEX) 4 MG tablet, Take 1 tablet (4 mg total) by mouth every 8 (eight) hours as needed for muscle spasms., Disp: 270 tablet, Rfl: 3   vitamin C (ASCORBIC ACID) 500 MG tablet, Take 500 mg by mouth daily., Disp: , Rfl:    zolpidem (AMBIEN CR) 12.5 MG CR tablet, TAKE 1 TABLET(12.5 MG) BY MOUTH AT BEDTIME AS NEEDED FOR SLEEP, Disp: 30 tablet, Rfl: 5   Objective:     Vitals:   11/27/21 0911  BP: 128/72  Pulse: (!) 53  SpO2: 98%  Weight: 193 lb (87.5 kg)  Height: 5\' 10"  (1.778 m)      Body mass index is 27.69 kg/m.    Physical Exam:    General: Well-appearing, cooperative, sitting comfortably in no acute distress.   OMT Physical Exam:    Cervical: TTP paraspinal, C2-4 RR SR Rib: Bilateral elevated first rib with TTP, worse on right Thoracic: TTP paraspinal, T3-7 RRSL   Cervical Spine: Posture normal Skin:  normal, intact  Neurological:   Strength:  Right  Left   Deltoid 5/5 5/5  Bicep 5/5  5/5  Tricep 5/5 5/5  Wrist Flexion 5/5 5/5  Wrist Extension 5/5 5/5  Grip 5/5 5/5  Finger Abduction 5/5 5/5   Sensation: intact to light touch in upper extremities bilaterally  Spurling's:  negative bilaterally Neck ROM: Full active ROM TTP: Cervical paraspinal on right, right trapezius, upper right thoracic paraspinal NTTP: cervical paraspinal on left, cervical spinous processes, remaining thoracic paraspinal, trapezius left  Electronically signed by:  [name] Brown D.[facility] 10:37 AM 11/27/21

## 2021-11-27 ENCOUNTER — Other Ambulatory Visit: Payer: Self-pay

## 2021-11-27 ENCOUNTER — Ambulatory Visit: Payer: PPO | Admitting: Sports Medicine

## 2021-11-27 VITALS — BP 128/72 | HR 53 | Ht 70.0 in | Wt 193.0 lb

## 2021-11-27 DIAGNOSIS — M9902 Segmental and somatic dysfunction of thoracic region: Secondary | ICD-10-CM

## 2021-11-27 DIAGNOSIS — M542 Cervicalgia: Secondary | ICD-10-CM | POA: Diagnosis not present

## 2021-11-27 DIAGNOSIS — M9901 Segmental and somatic dysfunction of cervical region: Secondary | ICD-10-CM

## 2021-11-27 DIAGNOSIS — M9908 Segmental and somatic dysfunction of rib cage: Secondary | ICD-10-CM

## 2021-11-27 NOTE — Patient Instructions (Addendum)
Good to see you  Neck ROM Tylenol [phone] mg 2-3 times a day for pain relief  Continue Voltaren gel  3 week follow up

## 2021-11-30 ENCOUNTER — Telehealth: Payer: Self-pay | Admitting: *Deleted

## 2021-11-30 NOTE — Telephone Encounter (Signed)
Left message on patients voicemail informing him of the date, time and location for his titration appointment.

## 2021-12-01 ENCOUNTER — Ambulatory Visit: Payer: PPO | Admitting: Cardiovascular Disease

## 2021-12-01 ENCOUNTER — Other Ambulatory Visit: Payer: Self-pay

## 2021-12-01 ENCOUNTER — Encounter: Payer: Self-pay | Admitting: Cardiovascular Disease

## 2021-12-01 VITALS — BP 122/56 | HR 60 | Ht 70.0 in | Wt 194.0 lb

## 2021-12-01 DIAGNOSIS — R0989 Other specified symptoms and signs involving the circulatory and respiratory systems: Secondary | ICD-10-CM

## 2021-12-01 DIAGNOSIS — G4733 Obstructive sleep apnea (adult) (pediatric): Secondary | ICD-10-CM

## 2021-12-01 DIAGNOSIS — Z951 Presence of aortocoronary bypass graft: Secondary | ICD-10-CM | POA: Diagnosis not present

## 2021-12-01 DIAGNOSIS — I714 Abdominal aortic aneurysm, without rupture, unspecified: Secondary | ICD-10-CM | POA: Diagnosis not present

## 2021-12-01 DIAGNOSIS — I480 Paroxysmal atrial fibrillation: Secondary | ICD-10-CM

## 2021-12-01 NOTE — Patient Instructions (Signed)
Medication Instructions:  Your physician recommends that you continue on your current medications as directed. Please refer to the Current Medication list given to you today.  *If you need a refill on your cardiac medications before your next appointment, please call your pharmacy*  Lab Work: If you have labs (blood work) drawn today and your tests are completely normal, you will receive your results only by: [place] (if you have MyChart) OR A paper copy in the mail If you have any lab test that is abnormal or we need to change your treatment, we will call you to review the results.  Testing/Procedures: Your physician has requested that you have a carotid duplex in April. This test is an ultrasound of the carotid arteries in your neck. It looks at blood flow through these arteries that supply the brain with blood. Allow one hour for this exam. There are no restrictions or special instructions.  Follow-Up: At [facility], you and your health needs are our priority.  As part of our continuing mission to provide you with exceptional heart care, we have created designated Provider Care Teams.  These Care Teams include your primary Cardiologist (physician) and Advanced Practice Providers (APPs -  Physician Assistants and Nurse Practitioners) who all work together to provide you with the care you need, when you need it.  We recommend signing up for the patient portal called "MyChart".  Sign up information is provided on this After Visit Summary.  MyChart is used to connect with patients for Virtual Visits (Telemedicine).  Patients are able to view lab/test results, encounter notes, upcoming appointments, etc.  Non-urgent messages can be sent to your provider as well.   To learn more about what you can do with MyChart, go to [email or web address].    Your next appointment:   12 month(s)  The format for your next appointment:   In Person  Provider:   Jenkins Rouge, MD {

## 2021-12-10 ENCOUNTER — Ambulatory Visit (INDEPENDENT_AMBULATORY_CARE_PROVIDER_SITE_OTHER): Payer: PPO

## 2021-12-10 DIAGNOSIS — E782 Mixed hyperlipidemia: Secondary | ICD-10-CM

## 2021-12-10 DIAGNOSIS — I25729 Atherosclerosis of autologous artery coronary artery bypass graft(s) with unspecified angina pectoris: Secondary | ICD-10-CM

## 2021-12-10 DIAGNOSIS — I4819 Other persistent atrial fibrillation: Secondary | ICD-10-CM

## 2021-12-10 DIAGNOSIS — R053 Chronic cough: Secondary | ICD-10-CM

## 2021-12-10 DIAGNOSIS — I10 Essential (primary) hypertension: Secondary | ICD-10-CM

## 2021-12-10 NOTE — Progress Notes (Signed)
Chronic Care Management Pharmacy Note  12/10/2021 Name:  [name] Brown MRN:  [id number] DOB:  [birth year]  Summary: -Patient since last appointment has switched from eliquis to [place] - denies any issues since switching - has been able to stop pantoprazole since stopping eliquis -BP well controlled at home, averaging 116/65 with HR in the 60's  -Reports compliance to current medications, denies any issues or concerns   Recommendations/Changes made from today's visit: -Recommending no changes to medications at this time, patient to continue monitoring BP at least once weekly, to reach out with any medication issues or concerns  Subjective: [name] Brown is an 71 y.o. year old male who is a primary patient of Hoyt Koch, MD.  The CCM team was consulted for assistance with disease management and care coordination needs.    Engaged with patient by telephone for follow up visit in response to provider referral for pharmacy case management and/or care coordination services.   Consent to Services:  The patient was given information about Chronic Care Management services, agreed to services, and gave verbal consent prior to initiation of services.  Please see initial visit note for detailed documentation.   Patient Care Team: Hoyt Koch, MD as PCP - General (Internal Medicine) Josue Hector, MD as PCP - Cardiology (Cardiology) Constance Haw, MD as PCP - Electrophysiology (Cardiology) Sueanne Margarita, MD as PCP - Sleep Medicine (Cardiology) Lujean Amel, MD as Attending Physician (Family Medicine) Tomasa Blase, [facility] (Pharmacist)  Recent office visits: None since last visit   Recent consult visits: 12/01/2021 - Dr. Johnsie Cancel - Cardiology - no changes sto medications - follow up in 12 months   11/27/2021 - Dr. [name] - Sports Medicine - neck pain - APAP 1022m 2-3 times daily - treat with OMT -follow up in 3 weeks  11/16/2021 - Dr. TRadford Pax- Cardiology - OSA  follow up - continue current medications - sleep study ordered  10/14/2021 - Dr. CCurt Bears- cardiology - no changes to medications - f/u in 6 months  07/15/2021 - Dr. AHavery Moros-Gertie Fey- due for colonoscopy - no changes to medication 05/27/2021 - Dr. DScot Dock- Vascular Surgery - stable, no changes to medications  Hospital visits: None in previous 6 months  Objective:  Lab Results  Component Value Date   CREATININE 1.18 12/25/2020   BUN 23 12/25/2020   GFR 63.17 12/25/2020   GFRNONAA 61 11/25/2020   GFRAA 70 11/25/2020   NA 142 12/25/2020   K 4.5 12/25/2020   CALCIUM 9.8 12/25/2020   CO2 30 12/25/2020    Lab Results  Component Value Date/Time   HGBA1C 5.8 (H) 11/29/2018 02:23 AM   GFR 63.17 12/25/2020 04:07 PM   GFR 52.23 (L) 03/16/2019 10:41 AM    Last diabetic Eye exam: No results found for: HMDIABEYEEXA  Last diabetic Foot exam: No results found for: HMDIABFOOTEX   Lab Results  Component Value Date   CHOL 109 02/04/2021   HDL 34 (L) 02/04/2021   LDLCALC 59 02/04/2021   TRIG 78 02/04/2021   CHOLHDL 3.2 02/04/2021    Hepatic Function Latest Ref Rng & Units 02/04/2021 12/25/2020 04/15/2020  Total Protein 6.0 - 8.5 g/dL 6.9 7.0 6.4(L)  Albumin 3.8 - 4.8 g/dL 4.6 4.1 3.8  AST 0 - 40 IU/L _0 ALT 0 - 44 IU/L _1 Alk Phosphatase 44 - 121 IU/L 64 49 42  Total Bilirubin 0.0 - 1.2 mg/dL 0.5 0.5  0.8  Bilirubin, Direct 0.00 - 0.40 mg/dL 0.17 - -    Lab Results  Component Value Date/Time   TSH 1.233 04/15/2020 09:44 AM   TSH 0.867 07/11/2019 04:19 PM   TSH 1.11 10/24/2018 04:19 PM    CBC Latest Ref Rng & Units 07/15/2021 12/25/2020 03/19/2020  WBC 4.0 - 10.5 K/uL 5.6 6.6 4.9  Hemoglobin 13.0 - 17.0 g/dL 13.4 12.4(L) 10.8(L)  Hematocrit 39.0 - 52.0 % 40.2 36.8(L) 32.8(L)  Platelets 150.0 - 400.0 K/uL 230.0 217.0 183    No results found for: VD25OH  Clinical ASCVD: Yes  The ASCVD Risk score (Arnett DK, et al., 2019) failed to calculate for the following  reasons:   The valid total cholesterol range is 130 to 320 mg/dL    Depression screen [facility] 2/9 04/23/2021 04/16/2020 10/24/2018  Decreased Interest 0 0 0  Down, Depressed, Hopeless 0 0 0  PHQ - 2 Score 0 0 0  Some recent data might be hidden    CHA2DS2-VASc Score = 3  The patient's score is based upon: CHF History: 0 HTN History: 1 Diabetes History: 0 Stroke History: 0 Vascular Disease History: 1 Age Score: 1 Gender Score: 0     Social History   Tobacco Use  Smoking Status Former   Packs/day: 2.00   Years: 30.00   Pack years: 60.00   Types: Cigarettes   Quit date: 01/05/1992   Years since quitting: 29.9  Smokeless Tobacco Never  Tobacco Comments   quit 19 yrs ago   BP Readings from Last 3 Encounters:  12/01/21 (!) 122/56  11/27/21 128/72  11/16/21 (!) 144/78   Pulse Readings from Last 3 Encounters:  12/01/21 60  11/27/21 (!) 53  11/16/21 75   Wt Readings from Last 3 Encounters:  12/01/21 194 lb (88 kg)  11/27/21 193 lb (87.5 kg)  11/16/21 193 lb 3.2 oz (87.6 kg)    Assessment/Interventions: Review of patient past medical history, allergies, medications, health status, including review of consultants reports, laboratory and other test data, was performed as part of comprehensive evaluation and provision of chronic care management services.   SDOH:  (Social Determinants of Health) assessments and interventions performed: Yes  SDOH Screenings   Alcohol Screen: Not on file  Depression (PHQ2-9): Low Risk    PHQ-2 Score: 0  Financial Resource Strain: Not on file  Food Insecurity: Not on file  Housing: Not on file  Physical Activity: Not on file  Social Connections: Not on file  Stress: Not on file  Tobacco Use: Medium Risk   Smoking Tobacco Use: Former   Smokeless Tobacco Use: Never   Passive Exposure: Not on file  Transportation Needs: Not on file     [place]  Allergies  Allergen Reactions   Statins     Myalgias with elevated CPKs     Medications Reviewed Today     Reviewed by Tomasa Blase, [facility] (Pharmacist) on 12/10/21 at [phone]  Med List Status: <None>   Medication Order Taking? Sig Documenting Provider Last Dose Status Informant  albuterol (VENTOLIN HFA) 108 (90 Base) MCG/ACT inhaler [id number] Yes INHALE 2 PUFFS INTO THE LUNGS EVERY 6 HOURS AS NEEDED FOR WHEEZING OR SHORTNESS OF Hoover Brunette, MD Taking Active   aspirin EC 81 MG EC tablet [id number] Yes Take 1 tablet (81 mg total) by mouth daily. Ilean China Taking Active   Biotin 10000 MCG TABS [id number] Yes Take 10,000 mcg by mouth in the morning. [provider] Taking Active Self  cholecalciferol (VITAMIN D) 25 MCG (1000 UT) tablet [id number] Yes Take 1,000 Units by mouth every evening. [provider] Taking Active Self  diclofenac sodium (VOLTAREN) 1 % GEL [id number] Yes Apply 2 g topically 4 (four) times daily. Hoyt Koch, MD Taking Active   divalproex (DEPAKOTE ER) 500 MG 24 hr tablet [id number] Yes TAKE 1 TABLET(500 MG) BY MOUTH DAILY Hoyt Koch, MD Taking Active   doxycycline (VIBRAMYCIN) 50 MG capsule [id number] Yes Take 50 mg by mouth every evening. [provider] Taking Active Self  Evolocumab (REPATHA SURECLICK) 263 MG/ML Darden Palmer [id number] Yes INJECT 1 PEN INTO THE SKIN EVERY 14 DAYS Josue Hector, MD Taking Active   ezetimibe (ZETIA) 10 MG tablet [id number] Yes Take 1 tablet (10 mg total) by mouth daily. Josue Hector, MD Taking Active   furosemide (LASIX) 20 MG tablet [id number] Yes TAKE 1 TABLET(20 MG) BY MOUTH TWICE DAILY Josue Hector, MD Taking Active   gabapentin (NEURONTIN) 300 MG capsule [id number] Yes TAKE 1 CAPSULE(300 MG) BY MOUTH TWICE DAILY Hoyt Koch, MD Taking Active   Glucosamine-Chondroitin (GLUCOSAMINE [facility] COMPLEX PO) [id number] Yes Take 500 mg by mouth 2 (two) times daily.  [provider] Taking Active Self  guaiFENesin (MUCINEX) 600 MG 12 hr  tablet [id number] Yes Take 600 mg by mouth 2 (two) times daily. [provider] Taking Active   hydrALAZINE (APRESOLINE) 25 MG tablet [id number] Yes Take 1 tablet (25 mg total) by mouth in the morning and at bedtime. Constance Haw, MD Taking Active   isosorbide mononitrate (IMDUR) 30 MG 24 hr tablet [id number] Yes TAKE 1 TABLET BY MOUTH EVERY DAY Josue Hector, MD Taking Active   montelukast (SINGULAIR) 10 MG tablet [id number] Yes Take 1 tablet (10 mg total) by mouth at bedtime. Hoyt Koch, MD Taking Active   Multiple Vitamin (MULTIVITAMIN WITH MINERALS) TABS tablet [id number] Yes Take 1 tablet by mouth every evening. [provider] Taking Active Self  nitroGLYCERIN (NITROSTAT) 0.4 MG SL tablet [id number]  DISSOLVE 1 TABLET UNDER THE TONGUE EVERY 5 MINUTES FOR 3 DOSES AS NEEDED FOR CHEST PAIN Josue Hector, MD  Active Self  Omega-3 Fatty Acids (FISH OIL) 1200 MG CAPS [id number] Yes Take 1,200 mg by mouth 2 (two) times daily. [provider] Taking Active Self  potassium chloride SA (KLOR-CON) 20 MEQ tablet [id number] Yes TAKE 1 TABLET BY MOUTH EVERY DAY Josue Hector, MD Taking Active   ramipril (ALTACE) 10 MG capsule [id number] Yes TAKE 2 CAPSULES(20 MG) BY MOUTH DAILY Josue Hector, MD Taking Active   rivaroxaban (XARELTO) 20 MG TABS tablet [id number] Yes Take 1 tablet (20 mg total) by mouth daily with supper. Josue Hector, MD Taking Active            Med Note Thornton Papas Nov 16, 2021  8:59 AM)    tamsulosin (FLOMAX) 0.4 MG CAPS capsule [id number] Yes TAKE 1 CAPSULE(0.4 MG) BY MOUTH DAILY Hoyt Koch, MD Taking Active   vitamin C (ASCORBIC ACID) 500 MG tablet [id number] Yes Take 500 mg by mouth daily. [provider] Taking Active   zolpidem (AMBIEN CR) 12.5 MG CR tablet [id number] Yes TAKE 1 TABLET(12.5 MG) BY MOUTH AT BEDTIME AS NEEDED FOR SLEEP Hoyt Koch, MD Taking Active             Patient Active Problem  List   Diagnosis Date Noted  Chronic cough 04/23/2021   Epigastric pain 12/26/2020   Statin myopathy 09/25/2020   Insomnia 02/06/2020   Persistent atrial fibrillation ([place]) 11/23/2019   Secondary hypercoagulable state ([place]) 11/23/2019   OSA (obstructive sleep apnea) 08/27/2019   Arthralgia 04/09/2019   Throat congestion 02/23/2019   Marijuana use 12/02/2018   HLD (hyperlipidemia) 11/29/2018   CAD (coronary artery disease) 11/29/2018   Chest pain 11/29/2018   Scalp cyst 10/25/2018   Degenerative disc disease, lumbar 03/09/2018   Routine general medical examination at a health care facility 09/22/2017   BPH without urinary obstruction 07/22/2016   Anemia, iron deficiency 07/11/2015   Nonallopathic lesion-rib cage 05/29/2015   Nonallopathic lesion of thoracic region 05/29/2015   Nonallopathic lesion of cervical region 05/29/2015   AV block, 2nd degree 10/29/2013   Migraine- chronic Verapamil 10/29/2013   Paroxysmal atrial fibrillation ([place]) 10/26/2013   Carotid bruit- 58-68% LICA by doppler 2/57 03/14/2012   ALCOHOL USE 01/23/2009   Essential hypertension 01/22/2009   Bradycardia 01/22/2009   Aortic aneurysm ([place]) 01/22/2009    Immunization History  Administered Date(s) Administered   Influenza, High Dose Seasonal PF 06/13/2019   Influenza,inj,Quad PF,6+ Mos 07/24/2013, 07/25/2014   Influenza-Unspecified 07/26/2015, 07/24/2016, 06/25/2017, 07/25/2018, 06/26/2019   Moderna Sars-Covid-2 Vaccination 11/23/2019, 12/21/2019   Pneumococcal Conjugate-13 10/25/2018   Pneumococcal Polysaccharide-23 02/06/2020   Tdap 10/11/2012   Zoster Recombinat (Shingrix) 08/12/2018, 09/13/2018, 11/10/2018   Zoster, Live 07/24/2013    Conditions to be addressed/monitored:  Hypertension, Hyperlipidemia, Atrial Fibrillation and Coronary Artery Disease, GERD  Care Plan : CCM Pharmacy Care Plan  Updates made by Tomasa Blase, RPH since 12/10/2021 12:00 AM     Problem: Hypertension,  Hyperlipidemia, Atrial Fibrillation and Coronary Artery Disease, GERD   Priority: High     Long-Range Goal: Disease management   Start Date: 12/03/2020  Expected End Date: 12/11/2022  This Visit's Progress: On track  Recent Progress: On track  Priority: High  Note:   Current Barriers:  Unable to independently monitor therapeutic efficacy  Pharmacist Clinical Goal(s):  Patient will achieve adherence to monitoring guidelines and medication adherence to achieve therapeutic efficacy  Interventions: 1:1 collaboration with Hoyt Koch, MD regarding development and update of comprehensive plan of care as evidenced by provider attestation and co-signature Inter-disciplinary care team collaboration (see longitudinal plan of care) Comprehensive medication review performed; medication list updated in electronic medical record  Hypertension (BP goal < 130/80) Controlled - BP at home averaging ~116/65 with HR in the 60's - denies any issues with hypotension / bradycardia  BP Readings from Last 3 Encounters:  12/01/21 (!) 122/56  11/27/21 128/72  11/16/21 (!) 144/78  Current regimen:  Ramipril 20 mg daily HS Hydralazine 25 mg twice a day Isosorbide MN 30 mg daily Furosemide 20 mg BID Interventions: Discussed BP goals and benefits of medications for prevention of heart attack / stroke Recommend to continue current medication   Hyperlipidemia / Coronary artery disease (LDL goal < 55) Controlled Lab Results  Component Value Date   LDLCALC 59 02/04/2021  HX - CABG 2001, AAA stent coronary graft 2009, L coronary cath 2015. Family history of premature CAD. Hx intolerance to multiple statins. Current regimen:  Ezetimibe 10 mg daily Repatha 140 mg [place] q14 days isosorbide MN 30 mg daily,  nitroglycerin 0.4 mg asneeded  aspirin 81 mg daily fish oil OTC daily Interventions: Discussed cholesterol goals and benefits of medications for prevention of heart attack / stroke Recommend to  continue current medication  Atrial Fibrillation  Controlled - since switching from eliquis to Xarelto - no issues with GERD - has been able to stop pantoprazole  Current regimen:  Xarelto 20 mg daily  Interventions: Discussed benefits of anticoagulation for stroke prevention Recommend to continue current medication  Chronic cough (Goal: manage symptoms) -Controlled  -Current treatment  Guaifenesin 666m BID  Montelukast 10 mg daily HS Albuterol HFA prn -Recommended to continue current medication  Patient Goals/Self-Care Activities Patient will:  - take medications as prescribed -focus on medication adherence by pill box -check blood pressure at least once weekly document, and provide at future appointments      Care Gaps: Covid booster   [facility] [address]Phone: [phone]: [address] [address]Phone: [phone]: [phone]  Uses pill box? Yes Pt endorses 100% compliance  Care Plan and Follow Up Patient Decision:  Patient agrees to Care Plan and Follow-up.  Plan: Telephone follow up appointment with care management team member scheduled for:  12 months  DTomasa Blase PharmD Clinical Pharmacist, [place]

## 2021-12-10 NOTE — Patient Instructions (Signed)
Visit Information ? ?Following are the goals we discussed today:  ? ?Manage My Medicine  ? ?Timeframe:  Long-Range Goal ?Priority:  Medium ?Start Date: 12/10/2021                            ?Expected End Date: 12/11/2022                     ? ?Follow Up Date 12/11/2022 ?  ?- call for medicine refill 2 or 3 days before it runs out ?- call if I am sick and can't take my medicine ?- keep a list of all the medicines I take; vitamins and herbals too ?- learn to read medicine labels ?- use a pillbox to sort medicine  ?  ?Why is this important?   ?These steps will help you keep on track with your medicines. ? ?Plan: Telephone follow up appointment with care management team member scheduled for:  12 months ?The patient has been provided with contact information for the care management team and has been advised to call with any health related questions or concerns.  ? ?Tomasa Blase, PharmD ?Clinical Pharmacist, [place]  ? ?Please call the care guide team at [phone] if you need to cancel or reschedule your appointment.  ? ?Patient verbalizes understanding of instructions and care plan provided today and agrees to view in [place]. Active MyChart status confirmed with patient.   ? ?

## 2021-12-11 ENCOUNTER — Other Ambulatory Visit: Payer: Self-pay | Admitting: Internal Medicine

## 2021-12-13 ENCOUNTER — Other Ambulatory Visit: Payer: Self-pay | Admitting: Cardiovascular Disease

## 2021-12-17 NOTE — Progress Notes (Signed)
? ? [name] Brown ?[facility] ?[place] ?Phone: [phone] ?  ?Assessment and Plan:   ?  ?1. Neck pain ?2. Pain in joint of right shoulder ?-Chronic with improvement, subsequent visit ?- Suspect that nerve stimulator implanted along anterior right chest and shoulder is the primary cause of patient's discomfort and lead to somatic dysfunction of C-spine exacerbated by C-spine DDD ?- Patient has had significant improvement in neck discomfort after starting regular HEP for neck.  Continue HEP ?- Continue Voltaren gel over irritated area, Tylenol 500 to 1000 mg 2-3 times a day for daily pain relief ?- May start ice or heat over irritated area to see if this is beneficial ?- Start HEP for shoulder.  Hope that strengthening shoulder will take tension away from nerve stimulator ?- Multiple other treatment options discussed, however I would not proceed with them at this time due to risk of disruption of nerve stimulator.  Would not proceed with therapeutic ultrasound, dry needling, CSI, cupping, scraping due to risk of disruption ?  ?Pertinent previous records reviewed include none ?  ?Follow Up: 4 to 6 weeks for reevaluation to see if conservative therapy is beneficial.  Could consider OMT ?  ?Subjective:   ?I, [name] Brown, am serving as a Education administrator for Doctor [name] Brown ? ?Chief Complaint: neck pain  ? ?HPI:  ?11/27/2021 ?Patient is a 70 year old male complaining of right shoulder pain. Patient states that is doing well pain starts on the pec and then goes into the neck will usually get him when hes doing any ROM , he will feel/ hear  a crunching sensation into his neck and stabbing sensation in his pec that just started a few days ago, no MOI, has been going on for a year. Has been using tylenol and Voltaren gel no numbness tingling pain is concentrated in his neck and /shoulder pec area  ? ?12/18/2021 ?Patient states he is doing 4/5  exercises of  HEP on a  daily basis, HEP is helping his neck a lot,  the shoulder is still giving him some pain its the same as when he came in  ? ? ?  ?Relevant Historical Information: History of C4 nerve stimulator implantation to right side of neck in 2021 related to poorly controlled OSA ? ?Additional pertinent review of systems negative. ? ? ?Current Outpatient Medications:  ?  albuterol (VENTOLIN HFA) 108 (90 Base) MCG/ACT inhaler, INHALE 2 PUFFS INTO THE LUNGS EVERY 6 HOURS AS NEEDED FOR WHEEZING OR SHORTNESS OF BREATH, Disp: 54 g, Rfl: 1 ?  aspirin EC 81 MG EC tablet, Take 1 tablet (81 mg total) by mouth daily., Disp: , Rfl:  ?  Biotin 10000 MCG TABS, Take 10,000 mcg by mouth in the morning., Disp: , Rfl:  ?  cholecalciferol (VITAMIN D) 25 MCG (1000 UT) tablet, Take 1,000 Units by mouth every evening., Disp: , Rfl:  ?  diclofenac sodium (VOLTAREN) 1 % GEL, Apply 2 g topically 4 (four) times daily., Disp: 100 g, Rfl: 3 ?  divalproex (DEPAKOTE ER) 500 MG 24 hr tablet, TAKE 1 TABLET(500 MG) BY MOUTH DAILY, Disp: 90 tablet, Rfl: 1 ?  doxycycline (VIBRAMYCIN) 50 MG capsule, Take 50 mg by mouth every evening., Disp: , Rfl:  ?  Evolocumab (REPATHA SURECLICK) 782 MG/ML SOAJ, INJECT 1 PEN INTO THE SKIN EVERY 14 DAYS, Disp: 6 mL, Rfl: 1 ?  ezetimibe (ZETIA) 10 MG tablet, Take 1 tablet (10 mg  total) by mouth daily., Disp: 90 tablet, Rfl: 3 ?  furosemide (LASIX) 20 MG tablet, TAKE 1 TABLET(20 MG) BY MOUTH TWICE DAILY, Disp: 180 tablet, Rfl: 3 ?  gabapentin (NEURONTIN) 300 MG capsule, TAKE 1 CAPSULE(300 MG) BY MOUTH TWICE DAILY, Disp: 180 capsule, Rfl: 2 ?  Glucosamine-Chondroitin (GLUCOSAMINE CHONDR COMPLEX PO), Take 500 mg by mouth 2 (two) times daily. , Disp: , Rfl:  ?  guaiFENesin (MUCINEX) 600 MG 12 hr tablet, Take 600 mg by mouth 2 (two) times daily., Disp: , Rfl:  ?  hydrALAZINE (APRESOLINE) 25 MG tablet, Take 1 tablet (25 mg total) by mouth in the morning and at bedtime., Disp: 270 tablet, Rfl: 1 ?  isosorbide mononitrate  (IMDUR) 30 MG 24 hr tablet, TAKE 1 TABLET BY MOUTH EVERY DAY, Disp: 90 tablet, Rfl: 3 ?  montelukast (SINGULAIR) 10 MG tablet, TAKE 1 TABLET(10 MG) BY MOUTH AT BEDTIME, Disp: 90 tablet, Rfl: 3 ?  Multiple Vitamin (MULTIVITAMIN WITH MINERALS) TABS tablet, Take 1 tablet by mouth every evening., Disp: , Rfl:  ?  nitroGLYCERIN (NITROSTAT) 0.4 MG SL tablet, DISSOLVE 1 TABLET UNDER THE TONGUE EVERY 5 MINUTES FOR 3 DOSES AS NEEDED FOR CHEST PAIN, Disp: 25 tablet, Rfl: 4 ?  Omega-3 Fatty Acids (FISH OIL) 1200 MG CAPS, Take 1,200 mg by mouth 2 (two) times daily., Disp: , Rfl:  ?  potassium chloride SA (KLOR-CON) 20 MEQ tablet, TAKE 1 TABLET BY MOUTH EVERY DAY, Disp: 90 tablet, Rfl: 3 ?  ramipril (ALTACE) 10 MG capsule, TAKE 2 CAPSULES(20 MG) BY MOUTH DAILY, Disp: 180 capsule, Rfl: 1 ?  rivaroxaban (XARELTO) 20 MG TABS tablet, Take 1 tablet (20 mg total) by mouth daily with supper., Disp: 90 tablet, Rfl: 3 ?  tamsulosin (FLOMAX) 0.4 MG CAPS capsule, TAKE 1 CAPSULE(0.4 MG) BY MOUTH DAILY, Disp: 90 capsule, Rfl: 3 ?  vitamin C (ASCORBIC ACID) 500 MG tablet, Take 500 mg by mouth daily., Disp: , Rfl:  ?  zolpidem (AMBIEN CR) 12.5 MG CR tablet, TAKE 1 TABLET(12.5 MG) BY MOUTH AT BEDTIME AS NEEDED FOR SLEEP, Disp: 30 tablet, Rfl: 5  ? ?Objective:   ?  ?Vitals:  ? 12/18/21 0907 12/18/21 0933  ?BP: 132/80   ?Pulse: (!) 58   ?SpO2: 93% 93%  ?Weight: 195 lb (88.5 kg)   ?Height: '5\' 10"'$  (1.778 m)   ?  ?  ?Body mass index is 27.98 kg/m?.  ?  ?Physical Exam:   ? ?Cervical Spine: Posture normal ?Skin: normal, intact ? ?Neurological:  ? ?Strength: ? Right  Left   ?Deltoid 5/5 5/5  ?Bicep 5/5  5/5  ?Tricep 5/5 5/5  ?Wrist Flexion 5/5 5/5  ?Wrist Extension 5/5 5/5  ?Grip 5/5 5/5  ?Finger Abduction 5/5 5/5  ? ?Sensation: intact to light touch in upper extremities bilaterally ? ?Spurling's:  negative bilaterally ?Neck ROM: Full active ROM ?TTP: Right trapezius, right anterior shoulder ?NTTP: cervical paraspinal, cervical spinous processes,  thoracic paraspinal, left trapezius  ? ? ?Electronically signed by:  ?[name] Brown ?[facility] ?9:37 AM 12/18/21 ?

## 2021-12-18 ENCOUNTER — Other Ambulatory Visit: Payer: Self-pay

## 2021-12-18 ENCOUNTER — Ambulatory Visit: Payer: PPO | Admitting: Sports Medicine

## 2021-12-18 VITALS — BP 132/80 | HR 58 | Ht 70.0 in | Wt 195.0 lb

## 2021-12-18 DIAGNOSIS — M25511 Pain in right shoulder: Secondary | ICD-10-CM

## 2021-12-18 DIAGNOSIS — M542 Cervicalgia: Secondary | ICD-10-CM | POA: Diagnosis not present

## 2021-12-18 NOTE — Patient Instructions (Addendum)
Good to see you  ?Continue Voltaren gel  ?Continue  Tylenol [phone] mg 2-3 times a day for pain relief  ?Continue HEP for neck  ?Start exercises for shoulder  ?4-6 week follow up  ?

## 2021-12-22 ENCOUNTER — Other Ambulatory Visit: Payer: Self-pay

## 2021-12-22 MED ORDER — EZETIMIBE 10 MG PO TABS: 10.0000 mg | ORAL_TABLET | Freq: Every day | ORAL | 3 refills | Status: DC

## 2021-12-30 ENCOUNTER — Other Ambulatory Visit: Payer: Self-pay

## 2021-12-30 ENCOUNTER — Ambulatory Visit (HOSPITAL_BASED_OUTPATIENT_CLINIC_OR_DEPARTMENT_OTHER): Payer: PPO | Attending: Cardiology | Admitting: Cardiology

## 2021-12-30 DIAGNOSIS — I493 Ventricular premature depolarization: Secondary | ICD-10-CM | POA: Diagnosis not present

## 2021-12-30 DIAGNOSIS — G4736 Sleep related hypoventilation in conditions classified elsewhere: Secondary | ICD-10-CM | POA: Insufficient documentation

## 2021-12-30 DIAGNOSIS — G4733 Obstructive sleep apnea (adult) (pediatric): Secondary | ICD-10-CM | POA: Diagnosis not present

## 2022-01-07 NOTE — Procedures (Signed)
? ?  Patient Name: [name] Brown, [place] ?Study Date: 12/30/2021 ?Gender: Male ?D.O.B: Nov 16, 1950 ?Age (years): 8 ?Referring Provider: Fransico Him MD, ABSM ?Height (inches): 70 ?Interpreting Physician: Fransico Him MD, ABSM ?Weight (lbs): 190 ?RPSGT: Laren Everts ?BMI: 27 ?MRN: [id number] ?Neck Size: 16.00 ? ?CLINICAL INFORMATION ?The patient is referred for a CPAP titration to treat sleep apnea. ? ?SLEEP STUDY TECHNIQUE ?As per the AASM Manual for the Scoring of Sleep and Associated Events v2.3 (April 2016) with a hypopnea requiring 4% desaturations. ? ?The channels recorded and monitored were frontal, central and occipital EEG, electrooculogram (EOG), submentalis EMG (chin), nasal and oral airflow, thoracic and abdominal wall motion, anterior tibialis EMG, snore microphone, electrocardiogram, and pulse oximetry. Continuous positive airway pressure (CPAP) was initiated at the beginning of the study and titrated to treat sleep-disordered breathing. ? ?MEDICATIONS ?Medications self-administered by patient taken the night of the study : GABAPENTIN, TRAZODONE, ACETAMINOPHEN, BENADRYL, AMBIEN, ALBUTEROL, MONTELUKAST ? ?TECHNICIAN COMMENTS ?Comments added by technician: None ?Comments added by scorer: N/A ? ?RESPIRATORY PARAMETERS ?Optimal PAP Pressure (cm): N/A  ?AHI at Optimal Pressure (/hr):N/A ?Overall Minimal O2 (%):86.0  ?Supine % at Optimal Pressure (%):0 ?Minimal O2 at Optimal Pressure (%): 86.0  ? ?SLEEP ARCHITECTURE ?The study was initiated at 10:44:34 PM and ended at 4:51:45 AM. ? ?Sleep onset time was 11.8 minutes and the sleep efficiency was 78.5%. The total sleep time was 288.4 minutes. ? ?The patient spent 22.4% of the night in stage N1 sleep, 68.4% in stage N2 sleep, 0.0% in stage N3 and 9.2% in REM.Stage REM latency was 183.0 minutes ? ?Wake after sleep onset was 67.0. Alpha intrusion was absent. Supine sleep was 0.02%. ? ?CARDIAC DATA ?The 2 lead EKG demonstrated sinus rhythm. The mean heart rate was 42.8  beats per minute. Other EKG findings include: PVCs. ? ?LEG MOVEMENT DATA ?The total Periodic Limb Movements of Sleep (PLMS) were 0. The PLMS index was 0.0. A PLMS index of <15 is considered normal in adults. ? ?IMPRESSIONS ?- An optimal amplitude could not be obtained due to ongoing respiratory events and hypoxemia. ?- Central sleep apnea was not noted during this titration (CAI = 1/h). ?- Moderate oxygen desaturations were observed during this titration (min O2 = 86.0%). ?- The patient snored with moderate snoring volume during this titration study. ?- 2-lead EKG demonstrated: PVCs ?- Clinically significant periodic limb movements were not noted during this study. Arousals associated with PLMs were rare. ? ?DIAGNOSIS ?- Obstructive Sleep Apnea (G47.33) ?- Nocturnal Hypoxemia ? ?RECOMMENDATIONS ?- Recommend referral back to ENT for evaluation due to ongoing significant OSA and hypoxemia.  ?- Avoid alcohol, sedatives and other CNS depressants that may worsen sleep apnea and disrupt normal sleep architecture. ?- Sleep hygiene should be reviewed to assess factors that may improve sleep quality. ?- Weight management and regular exercise should be initiated or continued. ?- Return to Sleep Center for re-evaluation after 4 weeks of therapy ? ?[Electronically signed] 01/07/2022 05:15 PM ? ?Fransico Him MD, ABSM ?Diplomate, Tax adviser of Sleep Medicine ?

## 2022-01-08 DIAGNOSIS — I4819 Other persistent atrial fibrillation: Secondary | ICD-10-CM

## 2022-01-08 DIAGNOSIS — I10 Essential (primary) hypertension: Secondary | ICD-10-CM | POA: Diagnosis not present

## 2022-01-08 DIAGNOSIS — I25729 Atherosclerosis of autologous artery coronary artery bypass graft(s) with unspecified angina pectoris: Secondary | ICD-10-CM

## 2022-01-08 DIAGNOSIS — E782 Mixed hyperlipidemia: Secondary | ICD-10-CM

## 2022-01-20 ENCOUNTER — Telehealth: Payer: Self-pay | Admitting: *Deleted

## 2022-01-20 NOTE — Telephone Encounter (Signed)
-----   Message from Sueanne Margarita, MD sent at 01/07/2022  5:17 PM EDT ----- ?Gae Bon please let the inspire rep know that patient had there in lab sleep study done with inspire titration and still has severe sleep apnea with significant hypoxemia and need to know neck steps whether it be an in office visit will refer back to ENT for repeat endoscopy ?

## 2022-01-20 NOTE — Telephone Encounter (Signed)
The patient has been notified of the result and verbalized understanding. Per DPR Left detailed message on voicemail and informed patient to call back with questions. Marolyn Hammock, [place] 01/20/2022 6:32 PM   ? ? ?

## 2022-01-22 ENCOUNTER — Ambulatory Visit: Payer: PPO | Admitting: Sports Medicine

## 2022-01-22 NOTE — Telephone Encounter (Signed)
Hello Gerald Stabs, ? ?I am notifying you of Mr. Rommel titration results. See below. ? ? Sueanne Margarita, MD  [name], CMA ? ?Gae Bon please let the inspire rep know that patient had there in lab sleep study done with inspire titration and still has severe sleep apnea with significant hypoxemia and need to know next steps whether it be an in office visit will refer back to ENT for repeat endoscopy. ? ?  ?Thanks for sending.  We will see the patient back at Dr. Landis Gandy clinic.  If you could let me know once the patient is scheduled that would be great. ? ?  ? ?All the best, ? ?Gerald Stabs ?

## 2022-01-24 ENCOUNTER — Other Ambulatory Visit: Payer: Self-pay | Admitting: Internal Medicine

## 2022-01-24 DIAGNOSIS — G44009 Cluster headache syndrome, unspecified, not intractable: Secondary | ICD-10-CM

## 2022-01-27 ENCOUNTER — Ambulatory Visit (HOSPITAL_COMMUNITY)
Admission: RE | Admit: 2022-01-27 | Discharge: 2022-01-27 | Disposition: A | Discharge status: Home / Self Care | Payer: PPO | Source: Ambulatory Visit | Attending: Cardiovascular Disease | Admitting: Cardiovascular Disease

## 2022-01-27 DIAGNOSIS — R0989 Other specified symptoms and signs involving the circulatory and respiratory systems: Secondary | ICD-10-CM | POA: Insufficient documentation

## 2022-01-28 NOTE — Telephone Encounter (Signed)
Appointment scheduled for 5/10. Inspire reps have been notified.  ?

## 2022-02-17 ENCOUNTER — Encounter: Payer: Self-pay | Admitting: Cardiology

## 2022-02-17 ENCOUNTER — Ambulatory Visit: Payer: PPO | Admitting: Cardiology

## 2022-02-17 VITALS — BP 138/76 | HR 64 | Ht 70.0 in | Wt 191.0 lb

## 2022-02-17 DIAGNOSIS — G4733 Obstructive sleep apnea (adult) (pediatric): Secondary | ICD-10-CM

## 2022-02-17 DIAGNOSIS — I10 Essential (primary) hypertension: Secondary | ICD-10-CM | POA: Diagnosis not present

## 2022-02-17 NOTE — Progress Notes (Signed)
? ? ?  ? ?Date:  03/18/2021  ? ?ID:  [name] Brown, DOB 11/01/1950, MRN [id number] ?The patient was identified using 2 identifiers. ? ?PCP:  Hoyt Koch, MD  ?Cardiologist:  Jenkins Rouge, MD  ?Sleep Medicine:  Leanora Cover, MD ?Electrophysiologist:  Will Meredith Leeds, MD  ? ?Chief Complaint:  OSA ? ?History of Present Illness:   ? ?[name] Brown is a 71 y.o. male with a hx of afib, SSS, CAD.  He has a hx of OSA with home sleep study documenting this in 2018.  He was placed on CPAP but at that time did not tolerate CPAP therapy.  He was using a nasal mask or nasal pillow mask but it bothered him so much he could not use it.  He was referred to Dr. Ron Parker for an oral device but apparently needed implants put in because he does not have enough teeth but it was cost prohibitive.  ? ?He decided to retry CPAP and underwent CPAP tiration in 08/2019.  Due to ongoing respiratory events he could not be adequately titrated on CPAP and was changed to BiPAP.  He again could not tolerate BiPAP.   Due to problems with excessive daytime sleepiness he was referred to ENT to be evaluated for left hypoglossal nerve stimulator.   ? ?He underwent Hypoglossal N stimulator placement 07/30/2020 and underwent device activation. He  was seen back in office and was doing well with his device and was at level 6.  He underwent titration in the sleep lab but was inadequate due to ongoing respiratory events.  He was seen back in clinic 4 weeks ago and his functional threshold had decreased to 1.3V likely related to healing around the nerve and his range was changed to 1.7-2.7.   ? ?At last office visit he was doing well with his device and using it nightly. He was on level 6-7 which was 2.3V.  He was supposed to have a home sleep study but when he did the sleep study both times he had difficulty maneuvering the device and therefore the sleep study was not performed. ? ?He is back today and doing quite well.  He is sleeping on average  5.6 hours a day.  Which is much more than he had been.  He feels rested in the morning and does not really have any significant daytime sleepiness.  He has no abnormal tongue sensations, pain or jaw pain.  He is currently on level 8 which is 2.4 V. ? ?Prior CV studies:   ?The following studies were reviewed today: ? ? Inspire titration ? ?Past Medical History:  ?Diagnosis Date  ? Aortic aneurysm [facility]) Endograf 2009  ? Arthritis   ? Atrial fibrillation ([place])   ? Bradycardia   ? CAD (coronary artery disease) CABG 03/2000  ? Median sternotomy for coronary artery bypass grafting x 3 (left  ? HTN (hypertension)   ? Hx of CABG 2001  ? severe 3 vessel disease  ? Hx of colonic polyp 06/2010  ? Hyperplastic   ? Hyperlipidemia   ? Myocardial infarction [facility])   ? Sleep apnea   ? ?Past Surgical History:  ?Procedure Laterality Date  ? ATRIAL FIBRILLATION ABLATION N/A 03/19/2020  ? Procedure: ATRIAL FIBRILLATION ABLATION;  Surgeon: Constance Haw, MD;  Location: [place] CV LAB;  Service: Cardiovascular;  Laterality: N/A;  ? CARDIOVERSION N/A 11/28/2018  ? Procedure: CARDIOVERSION;  Surgeon: Elouise Munroe, MD;  Location: [place];  Service: Cardiovascular;  Laterality: N/A;  ? CARDIOVERSION N/A 01/13/2021  ? Procedure: CARDIOVERSION;  Surgeon: Pixie Casino, MD;  Location: [facility] ENDOSCOPY;  Service: Cardiovascular;  Laterality: N/A;  ? COLONOSCOPY    ? CORONARY ARTERY BYPASS GRAFT    ? AAA stent graft on 12/01/2007 at [facility]  coronary bypass in 2001. Dr. Roxy Manns.  ? DRUG INDUCED ENDOSCOPY Right 02/18/2020  ? Procedure: DRUG INDUCED ENDOSCOPY;  Surgeon: Melida Quitter, MD;  Location: [place];  Service: ENT;  Laterality: Right;  ? FOOT SURGERY Left 07/19/2017  ? IMPLANTATION OF HYPOGLOSSAL NERVE STIMULATOR Right 07/30/2020  ? Procedure: IMPLANTATION OF HYPOGLOSSAL NERVE STIMULATOR Implantation of Chest wall respirator Sensor Electrode Electronic Analysis of Implanted Neurostimulator  Pulse Generator;  Surgeon: Melida Quitter, MD;  Location: [place];  Service: ENT;  Laterality: Right;  ? LEFT HEART CATHETERIZATION WITH CORONARY ANGIOGRAM N/A 10/26/2013  ? Procedure: LEFT HEART CATHETERIZATION WITH CORONARY ANGIOGRAM;  Surgeon: Peter M Martinique, MD;  Location: [facility] CATH LAB;  Service: Cardiovascular;  Laterality: N/A;  ? MASS EXCISION Right 08/10/2019  ? Procedure: EXCISION OF CYST FROM RIGHT SCALP;  Surgeon: Jovita Kussmaul, MD;  Location: [place];  Service: General;  Laterality: Right;  ?  ? ?Current Meds  ?Medication Sig  ? albuterol (VENTOLIN HFA) 108 (90 Base) MCG/ACT inhaler INHALE 2 PUFFS INTO THE LUNGS EVERY 6 HOURS AS NEEDED FOR WHEEZING OR SHORTNESS OF BREATH  ? aspirin EC 81 MG EC tablet Take 1 tablet (81 mg total) by mouth daily.  ? Biotin 10000 MCG TABS Take 10,000 mcg by mouth in the morning.  ? cholecalciferol (VITAMIN D) 25 MCG (1000 UT) tablet Take 1,000 Units by mouth every evening.  ? diclofenac sodium (VOLTAREN) 1 % GEL Apply 2 g topically 4 (four) times daily.  ? divalproex (DEPAKOTE ER) 500 MG 24 hr tablet TAKE 1 TABLET(500 MG) BY MOUTH DAILY  ? doxycycline (VIBRAMYCIN) 50 MG capsule Take 50 mg by mouth every evening.  ? Evolocumab (REPATHA SURECLICK) 841 MG/ML SOAJ INJECT 1 PEN INTO THE SKIN EVERY 14 DAYS  ? ezetimibe (ZETIA) 10 MG tablet Take 1 tablet (10 mg total) by mouth daily.  ? furosemide (LASIX) 20 MG tablet TAKE 1 TABLET(20 MG) BY MOUTH TWICE DAILY  ? gabapentin (NEURONTIN) 300 MG capsule TAKE 1 CAPSULE(300 MG) BY MOUTH TWICE DAILY  ? Glucosamine-Chondroitin (GLUCOSAMINE CHONDR COMPLEX PO) Take 500 mg by mouth 2 (two) times daily.   ? guaiFENesin (MUCINEX) 600 MG 12 hr tablet Take 600 mg by mouth 2 (two) times daily.  ? hydrALAZINE (APRESOLINE) 25 MG tablet Take 1 tablet (25 mg total) by mouth in the morning and at bedtime.  ? isosorbide mononitrate (IMDUR) 30 MG 24 hr tablet TAKE 1 TABLET BY MOUTH EVERY DAY  ? montelukast (SINGULAIR) 10  MG tablet TAKE 1 TABLET(10 MG) BY MOUTH AT BEDTIME  ? Multiple Vitamin (MULTIVITAMIN WITH MINERALS) TABS tablet Take 1 tablet by mouth every evening.  ? nitroGLYCERIN (NITROSTAT) 0.4 MG SL tablet DISSOLVE 1 TABLET UNDER THE TONGUE EVERY 5 MINUTES FOR 3 DOSES AS NEEDED FOR CHEST PAIN  ? Omega-3 Fatty Acids (FISH OIL) 1200 MG CAPS Take 1,200 mg by mouth 2 (two) times daily.  ? potassium chloride SA (KLOR-CON) 20 MEQ tablet TAKE 1 TABLET BY MOUTH EVERY DAY  ? ramipril (ALTACE) 10 MG capsule TAKE 2 CAPSULES(20 MG) BY MOUTH DAILY  ? rivaroxaban (XARELTO) 20 MG TABS tablet Take 1 tablet (20 mg  total) by mouth daily with supper.  ? tamsulosin (FLOMAX) 0.4 MG CAPS capsule TAKE 1 CAPSULE(0.4 MG) BY MOUTH DAILY  ? vitamin C (ASCORBIC ACID) 500 MG tablet Take 500 mg by mouth daily.  ? zolpidem (AMBIEN CR) 12.5 MG CR tablet TAKE 1 TABLET(12.5 MG) BY MOUTH AT BEDTIME AS NEEDED FOR SLEEP  ?  ? ?Allergies:   Statins  ? ?Social History  ? ?Tobacco Use  ? Smoking status: Former  ?  Packs/day: 2.00  ?  Years: 30.00  ?  Pack years: 60.00  ?  Types: Cigarettes  ?  Quit date: 01/05/1992  ?  Years since quitting: 30.1  ? Smokeless tobacco: Never  ? Tobacco comments:  ?  quit 19 yrs ago  ?Vaping Use  ? Vaping Use: Never used  ?Substance Use Topics  ? Alcohol use: No  ? Drug use: Not Currently  ?  Types: Marijuana  ?  ? ?Family Hx: ?The patient's family history includes Heart attack in his father and mother; Heart disease in his father and mother. There is no history of Rectal cancer, Stomach cancer, Pancreatic cancer, Esophageal cancer, Colon polyps, or Colon cancer. ? ?ROS:   ?Please see the history of present illness.    ? ?All other systems reviewed and are negative. ? ? ?Labs/Other Tests and Data Reviewed:   ? ?Recent Labs: ?07/15/2021: Hemoglobin 13.4; Platelets 230.0  ? ?Recent Lipid Panel ?Lab Results  ?Component Value Date/Time  ? CHOL 109 02/04/2021 07:47 AM  ? TRIG 78 02/04/2021 07:47 AM  ? HDL 34 (L) 02/04/2021 07:47 AM  ?  CHOLHDL 3.2 02/04/2021 07:47 AM  ? CHOLHDL 4.8 11/29/2018 02:23 AM  ? LDLCALC 59 02/04/2021 07:47 AM  ? ? ?Wt Readings from Last 3 Encounters:  ?02/17/22 191 lb (86.6 kg)  ?12/30/21 190 lb (86.2 kg)  ?03/10/

## 2022-02-17 NOTE — Patient Instructions (Signed)
Medication Instructions:  ?Your physician recommends that you continue on your current medications as directed. Please refer to the Current Medication list given to you today. ? ?*If you need a refill on your cardiac medications before your next appointment, please call your pharmacy* ? ?Follow-Up: ?At [facility], you and your health needs are our priority.  As part of our continuing mission to provide you with exceptional heart care, we have created designated Provider Care Teams.  These Care Teams include your primary Cardiologist (physician) and Advanced Practice Providers (APPs -  Physician Assistants and Nurse Practitioners) who all work together to provide you with the care you need, when you need it. ? ?Your next appointment:   ?4 week(s) ? ?The format for your next appointment:   ?Virtual Visit  ? ?Provider:   ?Fransico Him, MD ? ?Important Information About Sugar ? ? ? ? ?  ?

## 2022-03-10 DIAGNOSIS — H35373 Puckering of macula, bilateral: Secondary | ICD-10-CM | POA: Diagnosis not present

## 2022-03-10 DIAGNOSIS — H31002 Unspecified chorioretinal scars, left eye: Secondary | ICD-10-CM | POA: Diagnosis not present

## 2022-03-10 DIAGNOSIS — H5203 Hypermetropia, bilateral: Secondary | ICD-10-CM | POA: Diagnosis not present

## 2022-03-10 DIAGNOSIS — H2513 Age-related nuclear cataract, bilateral: Secondary | ICD-10-CM | POA: Diagnosis not present

## 2022-03-11 ENCOUNTER — Telehealth: Payer: Self-pay | Admitting: Internal Medicine

## 2022-03-11 MED ORDER — GABAPENTIN 300 MG PO CAPS: ORAL_CAPSULE | ORAL | 0 refills | Status: DC

## 2022-03-11 NOTE — Telephone Encounter (Signed)
Refill has been sent to the pt's pharmacy  

## 2022-03-11 NOTE — Telephone Encounter (Signed)
Patient needs gabapentin refilled.  Please send to [facility] on [address] - Patient still has a few left.

## 2022-03-13 ENCOUNTER — Other Ambulatory Visit: Payer: Self-pay | Admitting: Internal Medicine

## 2022-03-21 ENCOUNTER — Other Ambulatory Visit: Payer: Self-pay | Admitting: Cardiovascular Disease

## 2022-03-21 DIAGNOSIS — E785 Hyperlipidemia, unspecified: Secondary | ICD-10-CM

## 2022-03-21 DIAGNOSIS — I25729 Atherosclerosis of autologous artery coronary artery bypass graft(s) with unspecified angina pectoris: Secondary | ICD-10-CM

## 2022-04-06 ENCOUNTER — Encounter: Payer: Self-pay | Admitting: *Deleted

## 2022-04-06 ENCOUNTER — Ambulatory Visit: Payer: PPO | Admitting: Cardiology

## 2022-04-06 ENCOUNTER — Encounter: Payer: Self-pay | Admitting: Cardiology

## 2022-04-06 ENCOUNTER — Telehealth: Payer: PPO | Admitting: Cardiology

## 2022-04-06 VITALS — BP 112/73 | Ht 70.0 in | Wt 183.0 lb

## 2022-04-06 VITALS — BP 118/72 | HR 73 | Ht 70.0 in | Wt 186.4 lb

## 2022-04-06 DIAGNOSIS — I10 Essential (primary) hypertension: Secondary | ICD-10-CM | POA: Diagnosis not present

## 2022-04-06 DIAGNOSIS — Z01812 Encounter for preprocedural laboratory examination: Secondary | ICD-10-CM | POA: Diagnosis not present

## 2022-04-06 DIAGNOSIS — G4733 Obstructive sleep apnea (adult) (pediatric): Secondary | ICD-10-CM

## 2022-04-06 DIAGNOSIS — I4819 Other persistent atrial fibrillation: Secondary | ICD-10-CM | POA: Diagnosis not present

## 2022-04-07 DIAGNOSIS — H02831 Dermatochalasis of right upper eyelid: Secondary | ICD-10-CM | POA: Diagnosis not present

## 2022-04-07 DIAGNOSIS — H2513 Age-related nuclear cataract, bilateral: Secondary | ICD-10-CM | POA: Diagnosis not present

## 2022-04-07 DIAGNOSIS — H02834 Dermatochalasis of left upper eyelid: Secondary | ICD-10-CM | POA: Diagnosis not present

## 2022-04-15 ENCOUNTER — Ambulatory Visit (INDEPENDENT_AMBULATORY_CARE_PROVIDER_SITE_OTHER): Payer: PPO | Admitting: Internal Medicine

## 2022-04-15 ENCOUNTER — Encounter: Payer: Self-pay | Admitting: Internal Medicine

## 2022-04-15 VITALS — BP 126/70 | HR 64 | Resp 18 | Ht 70.0 in | Wt 184.0 lb

## 2022-04-15 DIAGNOSIS — R1032 Left lower quadrant pain: Secondary | ICD-10-CM | POA: Diagnosis not present

## 2022-04-15 NOTE — Patient Instructions (Signed)
WE will get the Ct scan to check for hernia and the pain.

## 2022-04-15 NOTE — Assessment & Plan Note (Signed)
No constipation, diarrhea. Could be hernia given that this is exacerbated by movement and lifting. Now daily and tylenol helps pain. Continue tylenol for pain and getting CT abdomen to rule out hernia.

## 2022-04-15 NOTE — Progress Notes (Signed)
   Subjective:   Patient ID: [name] Brown, male    DOB: 05-04-1951, 71 y.o.   MRN: [id number]  Groin Pain Associated symptoms include abdominal pain. Pertinent negatives include no chest pain, constipation, coughing, diarrhea, nausea, shortness of breath or vomiting.   The patient is a 71 YO man coming in for LLQ pain with bending started months ago and was rare, episodic. Now daily with any motion.  Review of Systems  Constitutional: Negative.   HENT: Negative.    Eyes: Negative.   Respiratory:  Negative for cough, chest tightness and shortness of breath.   Cardiovascular:  Negative for chest pain, palpitations and leg swelling.  Gastrointestinal:  Positive for abdominal pain. Negative for abdominal distention, constipation, diarrhea, nausea and vomiting.  Musculoskeletal: Negative.   Skin: Negative.   Neurological: Negative.   Psychiatric/Behavioral: Negative.      Objective:  Physical Exam Constitutional:      Appearance: He is well-developed.  HENT:     Head: Normocephalic and atraumatic.  Cardiovascular:     Rate and Rhythm: Normal rate and regular rhythm.  Pulmonary:     Effort: Pulmonary effort is normal. No respiratory distress.     Breath sounds: Normal breath sounds. No wheezing or rales.  Abdominal:     General: Bowel sounds are normal. There is no distension.     Palpations: Abdomen is soft.     Tenderness: There is abdominal tenderness. There is no rebound.     Comments: LLQ low pain to palpation with some bulge in the area no discrete hernia  Musculoskeletal:     Cervical back: Normal range of motion.  Skin:    General: Skin is warm and dry.  Neurological:     Mental Status: He is alert and oriented to person, place, and time.     Coordination: Coordination normal.     Vitals:   04/15/22 0936  BP: 126/70  Pulse: 64  Resp: 18  SpO2: 96%  Weight: 184 lb (83.5 kg)  Height: '5\' 10"'$  (1.778 m)    Assessment & Plan:

## 2022-04-25 ENCOUNTER — Other Ambulatory Visit: Payer: Self-pay | Admitting: Cardiovascular Disease

## 2022-04-27 ENCOUNTER — Telehealth: Payer: Self-pay | Admitting: Cardiovascular Disease

## 2022-04-27 NOTE — Telephone Encounter (Signed)
Spoke with Dr. Radford Pax who is not aware of any indication of the need to not use his Inspire device prior to an ablation.  I have reached out to the [facility] Rep to inquire if there is a reason that his device would need to be turned off.

## 2022-04-27 NOTE — Telephone Encounter (Signed)
Pt wanting to speak to someone about sleep study. States his insurance won't pay for home study and he is having to complete in a sleep lab (he will returning their call to schedule this today). His concern is having to stop [name] for ablation in October (states he had to stop it for 30 days for DCCV), and he does not want to have to repeat sleep study again after ablation. Informed that this is out of my knowledge base and will forward to appropriate RN to address questions. Patient verbalized understanding and agreeable to plan.

## 2022-04-27 NOTE — Telephone Encounter (Signed)
Patient has questions about upcoming procedure. Please advise

## 2022-04-30 ENCOUNTER — Ambulatory Visit (INDEPENDENT_AMBULATORY_CARE_PROVIDER_SITE_OTHER): Payer: PPO

## 2022-04-30 DIAGNOSIS — Z0000 Encounter for general adult medical examination without abnormal findings: Secondary | ICD-10-CM

## 2022-04-30 NOTE — Progress Notes (Signed)
Subjective:   [name] Brown is a 71 y.o. male who presents for an subsequent l Medicare Annual Wellness Visit.   I connected with [name] Brown today by telephone and verified that I am speaking with the correct person using two identifiers. Location patient: home Location provider: work Persons participating in the virtual visit: patient, provider.   I discussed the limitations, risks, security and privacy concerns of performing an evaluation and management service by telephone and the availability of in person appointments. I also discussed with the patient that there may be a patient responsible charge related to this service. The patient expressed understanding and verbally consented to this telephonic visit.    Interactive audio and video telecommunications were attempted between this provider and patient, however failed, due to patient having technical difficulties OR patient did not have access to video capability.  We continued and completed visit with audio only.    Review of Systems     Cardiac Risk Factors include: advanced age (>86mn, >>38women);male gender     Objective:    Today's Vitals   There is no height or weight on file to calculate BMI.     04/30/2022    8:21 AM 12/30/2021    8:27 PM 01/13/2021   10:15 AM 01/13/2021   10:11 AM 01/07/2021    8:41 PM 07/30/2020    7:21 AM 07/23/2020   12:59 PM  Advanced Directives  Does Patient Have a Medical Advance Directive? Yes Yes Yes Yes Yes No Yes  Type of AParamedicof AValenciaLiving will [place]aginawLiving will   HBerryLiving will  HSt. FrancisLiving will  Does patient want to make changes to medical advance directive?  No - Patient declined    No - Guardian declined No - Patient declined  Copy of HHowardin Chart? No - copy requested No - copy requested   No - copy requested  No - copy requested  Would patient like  information on creating a medical advance directive?      No - Patient declined     Current Medications (verified) Outpatient Encounter Medications as of 04/30/2022  Medication Sig   albuterol (VENTOLIN HFA) 108 (90 Base) MCG/ACT inhaler INHALE 2 PUFFS INTO THE LUNGS EVERY 6 HOURS AS NEEDED FOR WHEEZING OR SHORTNESS OF BREATH   aspirin EC 81 MG EC tablet Take 1 tablet (81 mg total) by mouth daily.   Biotin 10000 MCG TABS Take 10,000 mcg by mouth in the morning.   cholecalciferol (VITAMIN D) 25 MCG (1000 UT) tablet Take 1,000 Units by mouth every evening.   diclofenac sodium (VOLTAREN) 1 % GEL Apply 2 g topically 4 (four) times daily.   divalproex (DEPAKOTE ER) 500 MG 24 hr tablet TAKE 1 TABLET(500 MG) BY MOUTH DAILY   doxycycline (VIBRAMYCIN) 50 MG capsule Take 50 mg by mouth every evening.   ezetimibe (ZETIA) 10 MG tablet Take 1 tablet (10 mg total) by mouth daily.   furosemide (LASIX) 20 MG tablet TAKE 1 TABLET(20 MG) BY MOUTH TWICE DAILY   gabapentin (NEURONTIN) 300 MG capsule TAKE 1 CAPSULE(300 MG) BY MOUTH TWICE DAILY   Glucosamine-Chondroitin (GLUCOSAMINE CHONDR COMPLEX PO) Take 500 mg by mouth 2 (two) times daily.    guaiFENesin (MUCINEX) 600 MG 12 hr tablet Take 600 mg by mouth 2 (two) times daily.   hydrALAZINE (APRESOLINE) 25 MG tablet Take 1 tablet (25 mg total) by mouth in the  morning and at bedtime.   isosorbide mononitrate (IMDUR) 30 MG 24 hr tablet TAKE 1 TABLET BY MOUTH EVERY DAY   montelukast (SINGULAIR) 10 MG tablet TAKE 1 TABLET(10 MG) BY MOUTH AT BEDTIME   Multiple Vitamin (MULTIVITAMIN WITH MINERALS) TABS tablet Take 1 tablet by mouth every evening.   nitroGLYCERIN (NITROSTAT) 0.4 MG SL tablet DISSOLVE 1 TABLET UNDER THE TONGUE EVERY 5 MINUTES FOR 3 DOSES AS NEEDED FOR CHEST PAIN   Omega-3 Fatty Acids (FISH OIL) 1200 MG CAPS Take 1,200 mg by mouth 2 (two) times daily.   potassium chloride SA (KLOR-CON) 20 MEQ tablet TAKE 1 TABLET BY MOUTH EVERY DAY   ramipril (ALTACE) 10  MG capsule TAKE 2 CAPSULES(20 MG) BY MOUTH DAILY   REPATHA SURECLICK 989 MG/ML SOAJ INJECT 1 ML UNDER THE SKIN EVERY 14 DAYS   rivaroxaban (XARELTO) 20 MG TABS tablet Take 1 tablet (20 mg total) by mouth daily with supper.   tamsulosin (FLOMAX) 0.4 MG CAPS capsule TAKE 1 CAPSULE(0.4 MG) BY MOUTH DAILY   vitamin C (ASCORBIC ACID) 500 MG tablet Take 500 mg by mouth daily.   zolpidem (AMBIEN CR) 12.5 MG CR tablet TAKE 1 TABLET(12.5 MG) BY MOUTH AT BEDTIME AS NEEDED FOR SLEEP   No facility-administered encounter medications on file as of 04/30/2022.    Allergies (verified) Statins   History: Past Medical History:  Diagnosis Date   Aortic aneurysm [facility]) Endograf 2009   Arthritis    Atrial fibrillation (HCC)    Bradycardia    CAD (coronary artery disease) CABG 03/2000   Median sternotomy for coronary artery bypass grafting x 3 (left   HTN (hypertension)    Hx of CABG 2001   severe 3 vessel disease   Hx of colonic polyp 06/2010   Hyperplastic    Hyperlipidemia    Myocardial infarction [facility])    Sleep apnea    Past Surgical History:  Procedure Laterality Date   ATRIAL FIBRILLATION ABLATION N/A 03/19/2020   Procedure: ATRIAL FIBRILLATION ABLATION;  Surgeon: Constance Haw, MD;  Location: [place] CV LAB;  Service: Cardiovascular;  Laterality: N/A;   CARDIOVERSION N/A 11/28/2018   Procedure: CARDIOVERSION;  Surgeon: Elouise Munroe, MD;  Location: [facility] ENDOSCOPY;  Service: Cardiovascular;  Laterality: N/A;   CARDIOVERSION N/A 01/13/2021   Procedure: CARDIOVERSION;  Surgeon: Pixie Casino, MD;  Location[facility] ENDOSCOPY;  Service: Cardiovascular;  Laterality: N/A;   COLONOSCOPY     CORONARY ARTERY BYPASS GRAFT     AAA stent graft on 12/01/2007 at [facility]  coronary bypass in 2001. Dr. Roxy Manns.   DRUG INDUCED ENDOSCOPY Right 02/18/2020   Procedure: DRUG INDUCED ENDOSCOPY;  Surgeon: Melida Quitter, MD;  Location: [place];  Service: ENT;  Laterality:  Right;   FOOT SURGERY Left 07/19/2017   IMPLANTATION OF HYPOGLOSSAL NERVE STIMULATOR Right 07/30/2020   Procedure: IMPLANTATION OF HYPOGLOSSAL NERVE STIMULATOR Implantation of Chest wall respirator Sensor Electrode Electronic Analysis of Implanted Neurostimulator Pulse Generator;  Surgeon: Melida Quitter, MD;  Location: [place];  Service: ENT;  Laterality: Right;   LEFT HEART CATHETERIZATION WITH CORONARY ANGIOGRAM N/A 10/26/2013   Procedure: LEFT HEART CATHETERIZATION WITH CORONARY ANGIOGRAM;  Surgeon: Peter M Martinique, MD;  Location: [facility] CATH LAB;  Service: Cardiovascular;  Laterality: N/A;   MASS EXCISION Right 08/10/2019   Procedure: EXCISION OF CYST FROM RIGHT SCALP;  Surgeon: Jovita Kussmaul, MD;  Location: [place];  Service: General;  Laterality: Right;  Family History  Problem Relation Age of Onset   Heart attack Mother    Heart disease Mother    Heart attack Father    Heart disease Father    Rectal cancer Neg Hx    Stomach cancer Neg Hx    Pancreatic cancer Neg Hx    Esophageal cancer Neg Hx    Colon polyps Neg Hx    Colon cancer Neg Hx    Social History   Socioeconomic History   Marital status: Married    Spouse name: Not on file   Number of children: Not on file   Years of education: Not on file   Highest education level: Not on file  Occupational History   Not on file  Tobacco Use   Smoking status: Former    Packs/day: 2.00    Years: 30.00    Total pack years: 60.00    Types: Cigarettes    Quit date: 01/05/1992    Years since quitting: 30.3   Smokeless tobacco: Never   Tobacco comments:    quit 19 yrs ago  Vaping Use   Vaping Use: Never used  Substance and Sexual Activity   Alcohol use: No   Drug use: Not Currently    Types: Marijuana   Sexual activity: Not on file  Other Topics Concern   Not on file  Social History Narrative   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   Social Determinants  of Health   Financial Resource Strain: Low Risk  (04/30/2022)   Overall Financial Resource Strain (CARDIA)    Difficulty of Paying Living Expenses: Not hard at all  Food Insecurity: No Food Insecurity (04/30/2022)   Hunger Vital Sign    Worried About Running Out of Food in the Last Year: Never true    Ran Out of Food in the Last Year: Never true  Transportation Needs: No Transportation Needs (04/30/2022)   PRAPARE - Hydrologist (Medical): No    Lack of Transportation (Non-Medical): No  Physical Activity: Insufficiently Active (04/30/2022)   Exercise Vital Sign    Days of Exercise per Week: 3 days    Minutes of Exercise per Session: 30 min  Stress: No Stress Concern Present (04/30/2022)   [place]    Feeling of Stress : Not at all  Social Connections: Moderately Isolated (04/30/2022)   Social Connection and Isolation Panel [NHANES]    Frequency of Communication with Friends and Family: Three times a week    Frequency of Social Gatherings with Friends and Family: Three times a week    Attends Religious Services: Never    Active Member of Clubs or Organizations: No    Attends Music therapist: Never    Marital Status: Married    Tobacco Counseling Counseling given: Not Answered Tobacco comments: quit 19 yrs ago   Clinical Intake:  Pre-visit preparation completed: Yes  Pain : No/denies pain     Nutritional Risks: None Diabetes: No  How often do you need to have someone help you when you read instructions, pamphlets, or other written materials from your doctor or pharmacy?: 1 - Never What is the last grade level you completed in school?: hIGH sCHOOL  Diabetic?no   Interpreter Needed?: No  Information entered by :: L.Wilson,LPN   Activities of Daily Living    04/30/2022    8:24 AM  In your present state  of health, do you have any difficulty performing the  following activities:  Hearing? 0  Vision? 0  Difficulty concentrating or making decisions? 0  Walking or climbing stairs? 0  Dressing or bathing? 0  Doing errands, shopping? 0  Preparing Food and eating ? N  Using the Toilet? N  In the past six months, have you accidently leaked urine? N  Do you have problems with loss of bowel control? N  Managing your Medications? N  Managing your Finances? N  Housekeeping or managing your Housekeeping? N    Patient Care Team: Hoyt Koch, MD as PCP - General (Internal Medicine) Josue Hector, MD as PCP - Cardiology (Cardiology) Constance Haw, MD as PCP - Electrophysiology (Cardiology) Sueanne Margarita, MD as PCP - Sleep Medicine (Cardiology) Lujean Amel, MD as Attending Physician (Family Medicine) Szabat, Darnelle Maffucci, [facility] (Inactive) (Pharmacist)  Indicate any recent Medical Services you may have received from other than [facility] providers in the past year (date may be approximate).     Assessment:   This is a routine wellness examination for Zebastian.  Hearing/Vision screen Vision Screening - Comments:: Annual eye exams wears glasses  Dietary issues and exercise activities discussed: Current Exercise Habits: Home exercise routine, Type of exercise: walking, Time (Minutes): 30, Frequency (Times/Week): 3, Weekly Exercise (Minutes/Week): 90, Intensity: Mild, Exercise limited by: None identified   Goals Addressed   None    Depression Screen    04/30/2022    8:21 AM 04/30/2022    8:20 AM 04/15/2022    9:44 AM 04/23/2021    9:42 AM 04/16/2020    9:11 AM 10/24/2018    3:57 PM 09/22/2017    8:18 AM  PHQ 2/9 Scores  PHQ - 2 Score 0 0 0 0 0 0 0  PHQ- 9 Score   3        Fall Risk    04/30/2022    8:21 AM 04/15/2022    9:44 AM 04/23/2021    9:43 AM 04/16/2020    9:14 AM 02/06/2020    8:22 AM  Fall Risk   Falls in the past year? 0 0 0 1 1  Number falls in past yr: 0 0 0 1 1  Injury with Fall? 0 0 0 0 1  Risk for fall due to : No  Fall Risks   History of fall(s)   Follow up Falls evaluation completed;Education provided   Falls evaluation completed     [place]:  Any stairs in or around the home? No  If so, are there any without handrails? No  Home free of loose throw rugs in walkways, pet beds, electrical cords, etc? Yes  Adequate lighting in your home to reduce risk of falls? Yes   ASSISTIVE DEVICES UTILIZED TO PREVENT FALLS:  Life alert? No  Use of a cane, walker or w/c? No  Grab bars in the bathroom? No  Shower chair or bench in shower? No  Elevated toilet seat or a handicapped toilet? No     Cognitive Function:  Normal cognitive status assessed by telephone conversation  by this Nurse Health Advisor. No abnormalities found.        Immunizations Immunization History  Administered Date(s) Administered   Influenza, High Dose Seasonal PF 06/13/2019   Influenza,inj,Quad PF,6+ Mos 07/24/2013, 07/25/2014   Influenza-Unspecified 07/26/2015, 07/24/2016, 06/25/2017, 07/25/2018, 06/26/2019   Moderna Sars-Covid-2 Vaccination 11/23/2019, 12/21/2019   Pneumococcal Conjugate-13 10/25/2018   Pneumococcal Polysaccharide-23 02/06/2020  Tdap 10/11/2012   Zoster Recombinat (Shingrix) 08/12/2018, 09/13/2018, 11/10/2018   Zoster, Live 07/24/2013    TDAP status: Up to date  Flu Vaccine status: Up to date  Pneumococcal vaccine status: Up to date  Covid-19 vaccine status: Completed vaccines  Qualifies for Shingles Vaccine? Yes   Zostavax completed Yes   Shingrix Completed?: Yes  Screening Tests Health Maintenance  Topic Date Due   COVID-19 Vaccine (3 - Moderna risk series) 01/18/2020   INFLUENZA VACCINE  05/11/2022   TETANUS/TDAP  10/11/2022   COLONOSCOPY (Pts 45-40yr Insurance coverage will need to be confirmed)  07/20/2026   Pneumonia Vaccine 70 Years old  Completed   Hepatitis C Screening  Completed   Zoster Vaccines- Shingrix  Completed   HPV VACCINES  Aged  Out    Health Maintenance  Health Maintenance Due  Topic Date Due   COVID-19 Vaccine (3 - Moderna risk series) 01/18/2020    Colorectal cancer screening: Type of screening: Colonoscopy. Completed 07/20/2021. Repeat every 5 years  Lung Cancer Screening: (Low Dose CT Chest recommended if Age 938-80years, 30 pack-year currently smoking OR have quit w/in 15years.) does not qualify.   Lung Cancer Screening Referral: n/a  Additional Screening:  Hepatitis C Screening: does not qualify;   Vision Screening: Recommended annual ophthalmology exams for early detection of glaucoma and other disorders of the eye. Is the patient up to date with their annual eye exam?  Yes  Who is the provider or what is the name of the office in which the patient attends annual eye exams? Dr.Gould  If pt is not established with a provider, would they like to be referred to a provider to establish care? No .   Dental Screening: Recommended annual dental exams for proper oral hygiene  Community Resource Referral / Chronic Care Management: CRR required this visit?  No   CCM required this visit?  No      Plan:     I have personally reviewed and noted the following in the patient's chart:   Medical and social history Use of alcohol, tobacco or illicit drugs  Current medications and supplements including opioid prescriptions. Patient is not currently taking opioid prescriptions. Functional ability and status Nutritional status Physical activity Advanced directives List of other physicians Hospitalizations, surgeries, and ER visits in previous 12 months Vitals Screenings to include cognitive, depression, and falls Referrals and appointments  In addition, I have reviewed and discussed with patient certain preventive protocols, quality metrics, and best practice recommendations. A written personalized care plan for preventive services as well as general preventive health recommendations were provided to  patient.     LRandel Pigg LPN   [id number]  Nurse Notes: none

## 2022-04-30 NOTE — Patient Instructions (Signed)
[name] Brown , Thank you for taking time to come for your Medicare Wellness Visit. I appreciate your ongoing commitment to your health goals. Please review the following plan we discussed and let me know if I can assist you in the future.   Screening recommendations/referrals: Colonoscopy: 07/20/2021 Recommended yearly ophthalmology/optometry visit for glaucoma screening and checkup Recommended yearly dental visit for hygiene and checkup  Vaccinations: Influenza vaccine: completed  Pneumococcal vaccine: completed  Tdap vaccine: 10/11/2012 Shingles vaccine: completed     Advanced directives: yes   Conditions/risks identified: none   Next appointment: none  Preventive Care 38 Years and Older, Male Preventive care refers to lifestyle choices and visits with your health care provider that can promote health and wellness. What does preventive care include? A yearly physical exam. This is also called an annual well check. Dental exams once or twice a year. Routine eye exams. Ask your health care provider how often you should have your eyes checked. Personal lifestyle choices, including: Daily care of your teeth and gums. Regular physical activity. Eating a healthy diet. Avoiding tobacco and drug use. Limiting alcohol use. Practicing safe sex. Taking low doses of aspirin every day. Taking vitamin and mineral supplements as recommended by your health care provider. What happens during an annual well check? The services and screenings done by your health care provider during your annual well check will depend on your age, overall health, lifestyle risk factors, and family history of disease. Counseling  Your health care provider may ask you questions about your: Alcohol use. Tobacco use. Drug use. Emotional well-being. Home and relationship well-being. Sexual activity. Eating habits. History of falls. Memory and ability to understand (cognition). Work and work  Statistician. Screening  You may have the following tests or measurements: Height, weight, and BMI. Blood pressure. Lipid and cholesterol levels. These may be checked every 5 years, or more frequently if you are over 62 years old. Skin check. Lung cancer screening. You may have this screening every year starting at age 24 if you have a 30-pack-year history of smoking and currently smoke or have quit within the past 15 years. Fecal occult blood test (FOBT) of the stool. You may have this test every year starting at age 84. Flexible sigmoidoscopy or colonoscopy. You may have a sigmoidoscopy every 5 years or a colonoscopy every 10 years starting at age 8. Prostate cancer screening. Recommendations will vary depending on your family history and other risks. Hepatitis C blood test. Hepatitis B blood test. Sexually transmitted disease (STD) testing. Diabetes screening. This is done by checking your blood sugar (glucose) after you have not eaten for a while (fasting). You may have this done every 1-3 years. Abdominal aortic aneurysm (AAA) screening. You may need this if you are a current or former smoker. Osteoporosis. You may be screened starting at age 73 if you are at high risk. Talk with your health care provider about your test results, treatment options, and if necessary, the need for more tests. Vaccines  Your health care provider may recommend certain vaccines, such as: Influenza vaccine. This is recommended every year. Tetanus, diphtheria, and acellular pertussis (Tdap, Td) vaccine. You may need a Td booster every 10 years. Zoster vaccine. You may need this after age 31. Pneumococcal 13-valent conjugate (PCV13) vaccine. One dose is recommended after age 81. Pneumococcal polysaccharide (PPSV23) vaccine. One dose is recommended after age 41. Talk to your health care provider about which screenings and vaccines you need and how often you need them.  This information is not intended to replace  advice given to you by your health care provider. Make sure you discuss any questions you have with your health care provider. Document Released: 10/24/2015 Document Revised: 06/16/2016 Document Reviewed: 07/29/2015 Elsevier Interactive Patient Education  2017 [employer] Prevention in the Home Falls can cause injuries. They can happen to people of all ages. There are many things you can do to make your home safe and to help prevent falls. What can I do on the outside of my home? Regularly fix the edges of walkways and driveways and fix any cracks. Remove anything that might make you trip as you walk through a door, such as a raised step or threshold. Trim any bushes or trees on the path to your home. Use bright outdoor lighting. Clear any walking paths of anything that might make someone trip, such as rocks or tools. Regularly check to see if handrails are loose or broken. Make sure that both sides of any steps have handrails. Any raised decks and porches should have guardrails on the edges. Have any leaves, snow, or ice cleared regularly. Use sand or salt on walking paths during winter. Clean up any spills in your garage right away. This includes oil or grease spills. What can I do in the bathroom? Use night lights. Install grab bars by the toilet and in the tub and shower. Do not use towel bars as grab bars. Use non-skid mats or decals in the tub or shower. If you need to sit down in the shower, use a plastic, non-slip stool. Keep the floor dry. Clean up any water that spills on the floor as soon as it happens. Remove soap buildup in the tub or shower regularly. Attach bath mats securely with double-sided non-slip rug tape. Do not have throw rugs and other things on the floor that can make you trip. What can I do in the bedroom? Use night lights. Make sure that you have a light by your bed that is easy to reach. Do not use any sheets or blankets that are too big for your bed.  They should not hang down onto the floor. Have a firm chair that has side arms. You can use this for support while you get dressed. Do not have throw rugs and other things on the floor that can make you trip. What can I do in the kitchen? Clean up any spills right away. Avoid walking on wet floors. Keep items that you use a lot in easy-to-reach places. If you need to reach something above you, use a strong step stool that has a grab bar. Keep electrical cords out of the way. Do not use floor polish or wax that makes floors slippery. If you must use wax, use non-skid floor wax. Do not have throw rugs and other things on the floor that can make you trip. What can I do with my stairs? Do not leave any items on the stairs. Make sure that there are handrails on both sides of the stairs and use them. Fix handrails that are broken or loose. Make sure that handrails are as long as the stairways. Check any carpeting to make sure that it is firmly attached to the stairs. Fix any carpet that is loose or worn. Avoid having throw rugs at the top or bottom of the stairs. If you do have throw rugs, attach them to the floor with carpet tape. Make sure that you have a light switch at the  top of the stairs and the bottom of the stairs. If you do not have them, ask someone to add them for you. What else can I do to help prevent falls? Wear shoes that: Do not have high heels. Have rubber bottoms. Are comfortable and fit you well. Are closed at the toe. Do not wear sandals. If you use a stepladder: Make sure that it is fully opened. Do not climb a closed stepladder. Make sure that both sides of the stepladder are locked into place. Ask someone to hold it for you, if possible. Clearly mark and make sure that you can see: Any grab bars or handrails. First and last steps. Where the edge of each step is. Use tools that help you move around (mobility aids) if they are needed. These  include: Canes. Walkers. Scooters. Crutches. Turn on the lights when you go into a dark area. Replace any light bulbs as soon as they burn out. Set up your furniture so you have a clear path. Avoid moving your furniture around. If any of your floors are uneven, fix them. If there are any pets around you, be aware of where they are. Review your medicines with your doctor. Some medicines can make you feel dizzy. This can increase your chance of falling. Ask your doctor what other things that you can do to help prevent falls. This information is not intended to replace advice given to you by your health care provider. Make sure you discuss any questions you have with your health care provider. Document Released: 07/24/2009 Document Revised: 03/04/2016 Document Reviewed: 11/01/2014 Elsevier Interactive Patient Education  2017 [employer].

## 2022-05-03 ENCOUNTER — Other Ambulatory Visit: Payer: Self-pay | Admitting: Internal Medicine

## 2022-05-03 DIAGNOSIS — N401 Enlarged prostate with lower urinary tract symptoms: Secondary | ICD-10-CM

## 2022-05-03 NOTE — Telephone Encounter (Signed)
Received information from [employer]. Patient does not need to stop using Inspire therapy before or after his ablation. Patient is aware.

## 2022-05-13 ENCOUNTER — Telehealth: Payer: Self-pay | Admitting: Internal Medicine

## 2022-05-13 ENCOUNTER — Ambulatory Visit
Admission: RE | Admit: 2022-05-13 | Discharge: 2022-05-13 | Disposition: A | Discharge status: Home / Self Care | Payer: PPO | Source: Ambulatory Visit | Attending: Internal Medicine | Admitting: Internal Medicine

## 2022-05-13 DIAGNOSIS — R1032 Left lower quadrant pain: Secondary | ICD-10-CM

## 2022-05-13 DIAGNOSIS — R109 Unspecified abdominal pain: Secondary | ICD-10-CM | POA: Diagnosis not present

## 2022-05-13 DIAGNOSIS — K4020 Bilateral inguinal hernia, without obstruction or gangrene, not specified as recurrent: Secondary | ICD-10-CM

## 2022-05-13 NOTE — Telephone Encounter (Signed)
Pt stated he was advised to give [place] a call and let us know if he would like a referral to a surgeon for his hernia. Pt would like the referral.  Please advise.

## 2022-05-14 NOTE — Telephone Encounter (Signed)
Referral ordered

## 2022-05-16 ENCOUNTER — Other Ambulatory Visit: Payer: Self-pay | Admitting: Cardiovascular Disease

## 2022-05-16 ENCOUNTER — Other Ambulatory Visit: Payer: Self-pay | Admitting: Cardiology

## 2022-05-16 DIAGNOSIS — I10 Essential (primary) hypertension: Secondary | ICD-10-CM

## 2022-05-25 ENCOUNTER — Telehealth: Payer: Self-pay | Admitting: Internal Medicine

## 2022-05-25 MED ORDER — CYCLOBENZAPRINE HCL 5 MG PO TABS: 5.0000 mg | ORAL_TABLET | Freq: Three times a day (TID) | ORAL | 1 refills | Status: DC | PRN

## 2022-05-25 NOTE — Telephone Encounter (Signed)
Patient called in stating that he has a painful hernia and is wondering what he can do to help with pain relief. Call back number is [phone].

## 2022-05-25 NOTE — Telephone Encounter (Signed)
Sent in cyclobenzaprine to take as needed up to 3 times a day. If he gets pain and bulge which does not relieve and he cannot reduce go to ER as the hernia there can cause problems.

## 2022-05-25 NOTE — Telephone Encounter (Signed)
Spoke with patient today. 

## 2022-06-02 ENCOUNTER — Encounter (HOSPITAL_BASED_OUTPATIENT_CLINIC_OR_DEPARTMENT_OTHER): Payer: PPO | Admitting: Cardiology

## 2022-06-02 ENCOUNTER — Other Ambulatory Visit: Payer: Self-pay | Admitting: Internal Medicine

## 2022-06-03 ENCOUNTER — Telehealth: Payer: Self-pay

## 2022-06-03 DIAGNOSIS — K429 Umbilical hernia without obstruction or gangrene: Secondary | ICD-10-CM | POA: Diagnosis not present

## 2022-06-03 DIAGNOSIS — K4020 Bilateral inguinal hernia, without obstruction or gangrene, not specified as recurrent: Secondary | ICD-10-CM | POA: Diagnosis not present

## 2022-06-03 NOTE — Telephone Encounter (Signed)
   Pre-operative Risk Assessment    Patient Name: HARDIN HARDENBROOK  DOB: 12-23-1950 MRN: [id number]      Request for Surgical Clearance    Procedure:   Inguinal Hernia Repair  Date of Surgery:  Clearance TBD                                 Surgeon:  Louanna Raw, MD Surgeon's Group or Practice Name:  [facility] Phone number:  [phone] Fax number:  [phone]   Type of Clearance Requested:   - Medical  - Pharmacy:  Hold Aspirin and Rivaroxaban (Xarelto)     Type of Anesthesia:  General    Additional requests/questions:   None  Signed, Satara Virella   06/03/2022, 1:03 PM

## 2022-06-04 NOTE — Telephone Encounter (Signed)
   Name: [name] Brown  DOB: Aug 22, 1951  MRN: [id number]  Primary Cardiologist: Jenkins Rouge, MD  Chart reviewed as part of pre-operative protocol coverage. Because of [name] past medical history and time since last visit, he will require a follow-up telephone visit in order to better assess preoperative cardiovascular risk.  Pre-op covering staff: - Please schedule appointment and call patient to inform them. If patient already had an upcoming appointment within acceptable timeframe, please add "pre-op clearance" to the appointment notes so provider is aware. - Please contact requesting surgeon's office via preferred method (i.e, phone, fax) to inform them of need for appointment prior to surgery.  Per our office protocol and pharmacy patient can hold Xarelto x2 days prior to the procedure.  Please resume when safe to do so  [name] Collard, PA-C  06/04/2022, 10:55 AM

## 2022-06-04 NOTE — Telephone Encounter (Signed)
Patient with diagnosis of afib on Xarelto for anticoagulation.    Procedure:  Inguinal Hernia Repair Date of procedure: TBD  CHA2DS2-VASc Score = 3   This indicates a 3.2% annual risk of stroke. The patient's score is based upon: CHF History: 0 HTN History: 1 Diabetes History: 0 Stroke History: 0 Vascular Disease History: 1 Age Score: 1 Gender Score: 0      CrCl 60 ml/min  Per office protocol, patient can hold Xarelto for 2 days prior to procedure.    **This guidance is not considered finalized until pre-operative APP has relayed final recommendations.**

## 2022-06-07 ENCOUNTER — Telehealth: Payer: Self-pay | Admitting: *Deleted

## 2022-06-07 NOTE — Telephone Encounter (Signed)
I s/w the pt and he asked me if Dr. Johnsie Cancel knew he was back in a-fib. Pt tells me that he is scheduled for an ablation with Dr. Curt Bears 08/10/22. Pt states though if he has to wait until he is back in rhythm before he can be cleared for the hernia surgery, then he would like to opt for the DCCV that he d/w Dr. Curt Bears as well. I assured the pt that I will bring this back to the pre op provider , Dr. Johnsie Cancel and Dr. Curt Bears as to what is the best and safest plan to proceed for him. I assured the pt that I will call back once we have a plan in place. Pt thanked me for the help.

## 2022-06-07 NOTE — Telephone Encounter (Signed)
Left message for the pt to call back for tele pre op appt.  ?

## 2022-06-07 NOTE — Telephone Encounter (Signed)
Pt is returning call.  

## 2022-06-07 NOTE — Telephone Encounter (Signed)
Reviewed chart. Pt back in AFib in June when seen by Dr. Curt Bears. Would recommend proceeding with preop phone call to determine if NSR is needed prior to surgery. If he would feel more comfortable with an in person visit for surgical clearance, we can also offer that. Richardson Dopp, PA-C    06/07/2022 11:12 AM

## 2022-06-07 NOTE — Telephone Encounter (Signed)
Pt agreeable to plan of care for tele pre op appt 06/16/22 @ 9 am. Med rec and consent are done.     Patient Consent for Virtual Visit        [name] Brown has provided verbal consent on 06/07/2022 for a virtual visit (video or telephone).   CONSENT FOR VIRTUAL VISIT FOR:  [name] Brown  By participating in this virtual visit I agree to the following:  I hereby voluntarily request, consent and authorize [place] and its employed or contracted physicians, physician assistants, nurse practitioners or other licensed health care professionals (the Practitioner), to provide me with telemedicine health care services (the "Services") as deemed necessary by the treating Practitioner. I acknowledge and consent to receive the Services by the Practitioner via telemedicine. I understand that the telemedicine visit will involve communicating with the Practitioner through live audiovisual communication technology and the disclosure of certain medical information by electronic transmission. I acknowledge that I have been given the opportunity to request an in-person assessment or other available alternative prior to the telemedicine visit and am voluntarily participating in the telemedicine visit.  I understand that I have the right to withhold or withdraw my consent to the use of telemedicine in the course of my care at any time, without affecting my right to future care or treatment, and that the Practitioner or I may terminate the telemedicine visit at any time. I understand that I have the right to inspect all information obtained and/or recorded in the course of the telemedicine visit and may receive copies of available information for a reasonable fee.  I understand that some of the potential risks of receiving the Services via telemedicine include:  Delay or interruption in medical evaluation due to technological equipment failure or disruption; Information transmitted may not be sufficient  (e.g. poor resolution of images) to allow for appropriate medical decision making by the Practitioner; and/or  In rare instances, security protocols could fail, causing a breach of personal health information.  Furthermore, I acknowledge that it is my responsibility to provide information about my medical history, conditions and care that is complete and accurate to the best of my ability. I acknowledge that Practitioner's advice, recommendations, and/or decision may be based on factors not within their control, such as incomplete or inaccurate data provided by me or distortions of diagnostic images or specimens that may result from electronic transmissions. I understand that the practice of medicine is not an exact science and that Practitioner makes no warranties or guarantees regarding treatment outcomes. I acknowledge that a copy of this consent can be made available to me via my patient portal ([place]), or I can request a printed copy by calling the office of [facility].    I understand that my insurance will be billed for this visit.   I have read or had this consent read to me. I understand the contents of this consent, which adequately explains the benefits and risks of the Services being provided via telemedicine.  I have been provided ample opportunity to ask questions regarding this consent and the Services and have had my questions answered to my satisfaction. I give my informed consent for the services to be provided through the use of telemedicine in my medical care

## 2022-06-07 NOTE — Telephone Encounter (Signed)
Pt agreeable to plan of care for tele pre op appt 06/16/22 @ 9 am. Med rec and consent are done.

## 2022-06-10 ENCOUNTER — Other Ambulatory Visit: Payer: Self-pay | Admitting: Internal Medicine

## 2022-06-15 NOTE — Progress Notes (Unsigned)
Virtual Visit via Telephone Note   Because of [name] Brown's co-morbid illnesses, he is at least at moderate risk for complications without adequate follow up.  This format is felt to be most appropriate for this patient at this time.  The patient did not have access to video technology/had technical difficulties with video requiring transitioning to audio format only (telephone).  All issues noted in this document were discussed and addressed.  No physical exam could be performed with this format.  Please refer to the patient's chart for his consent to telehealth for [facility].  Evaluation Performed:  Preoperative cardiovascular risk assessment _____________   Date:  06/15/2022   Patient ID:  [name] Brown, DOB [birth year], MRN [id number] Patient Location:  Home Provider location:   Office  Primary Care Provider:  Hoyt Koch, MD Primary Cardiologist:  Jenkins Rouge, MD  Chief Complaint / Patient Profile   71 y.o. y/o male with a h/o CAD s/p CABG (LIMA-LAD, RIMA-PDA, left radial to OM1), patient 2015 with occluded radial to him, native vessel only 82 to 40% stenosed, normal Myoview 11/08/2018, PVD s/p EVAR, a fib, OSA, HLD, HTN who is pending inguinal hernia repair and presents today for telephonic preoperative cardiovascular risk assessment.  Past Medical History    Past Medical History:  Diagnosis Date   Aortic aneurysm [facility]) Endograf 2009   Arthritis    Atrial fibrillation (HCC)    Bradycardia    CAD (coronary artery disease) CABG 03/2000   Median sternotomy for coronary artery bypass grafting x 3 (left   HTN (hypertension)    Hx of CABG 2001   severe 3 vessel disease   Hx of colonic polyp 06/2010   Hyperplastic    Hyperlipidemia    Myocardial infarction [facility])    Sleep apnea    Past Surgical History:  Procedure Laterality Date   ATRIAL FIBRILLATION ABLATION N/A 03/19/2020   Procedure: ATRIAL FIBRILLATION ABLATION;  Surgeon: Constance Haw,  MD;  Location: [facility] CV LAB;  Service: Cardiovascular;  Laterality: N/A;   CARDIOVERSION N/A 11/28/2018   Procedure: CARDIOVERSION;  Surgeon: Elouise Munroe, MD;  Location: [facility] ENDOSCOPY;  Service: Cardiovascular;  Laterality: N/A;   CARDIOVERSION N/A 01/13/2021   Procedure: CARDIOVERSION;  Surgeon: Pixie Casino, MD;  Location: [facility] ENDOSCOPY;  Service: Cardiovascular;  Laterality: N/A;   COLONOSCOPY     CORONARY ARTERY BYPASS GRAFT     AAA stent graft on 12/01/2007 at [facility]  coronary bypass in 2001. Dr. Roxy Manns.   DRUG INDUCED ENDOSCOPY Right 02/18/2020   Procedure: DRUG INDUCED ENDOSCOPY;  Surgeon: Melida Quitter, MD;  Location: [place];  Service: ENT;  Laterality: Right;   FOOT SURGERY Left 07/19/2017   IMPLANTATION OF HYPOGLOSSAL NERVE STIMULATOR Right 07/30/2020   Procedure: IMPLANTATION OF HYPOGLOSSAL NERVE STIMULATOR Implantation of Chest wall respirator Sensor Electrode Electronic Analysis of Implanted Neurostimulator Pulse Generator;  Surgeon: Melida Quitter, MD;  Location: [place];  Service: ENT;  Laterality: Right;   LEFT HEART CATHETERIZATION WITH CORONARY ANGIOGRAM N/A 10/26/2013   Procedure: LEFT HEART CATHETERIZATION WITH CORONARY ANGIOGRAM;  Surgeon: Peter M Martinique, MD;  Location: [facility] CATH LAB;  Service: Cardiovascular;  Laterality: N/A;   MASS EXCISION Right 08/10/2019   Procedure: EXCISION OF CYST FROM RIGHT SCALP;  Surgeon: Jovita Kussmaul, MD;  Location: [place];  Service: General;  Laterality: Right;    Allergies  Allergies  Allergen Reactions  Statins     Myalgias with elevated CPKs    History of Present Illness    [name] GRIEGER is a 71 y.o. male who presents via audio/video conferencing for a telehealth visit today.  Pt was last seen in cardiology clinic on 04/06/22 by Dr. Curt Bears. At that time JAXSIN BOTTOMLEY was doing well. The patient is now pending procedure as outlined above. Since  his last visit, he denies chest pain, shortness of breath, lower extremity edema, fatigue, palpitations, melena, hematuria, hemoptysis, diaphoresis, weakness, presyncope, syncope, orthopnea, and PND. Has not been as active over the past 6 weeks due to pain from the hernia but continues to do house/yard work and can walk > 2 blocks distance without cardiac symptoms.   Home Medications    Prior to Admission medications   Medication Sig Start Date End Date Taking? Authorizing Provider  albuterol (VENTOLIN HFA) 108 (90 Base) MCG/ACT inhaler INHALE 2 PUFFS INTO THE LUNGS EVERY 6 HOURS AS NEEDED FOR WHEEZING OR SHORTNESS OF BREATH 07/02/21   Hoyt Koch, MD  aspirin EC 81 MG EC tablet Take 1 tablet (81 mg total) by mouth daily. 10/29/13   Erlene Quan, PA-C  Biotin 10000 MCG TABS Take 10,000 mcg by mouth in the morning.    [provider]  cholecalciferol (VITAMIN D) 25 MCG (1000 UT) tablet Take 1,000 Units by mouth every evening.    [provider]  cyclobenzaprine (FLEXERIL) 5 MG tablet Take 1 tablet (5 mg total) by mouth 3 (three) times daily as needed for muscle spasms. 05/25/22   Hoyt Koch, MD  diclofenac sodium (VOLTAREN) 1 % GEL Apply 2 g topically 4 (four) times daily. 04/09/19   Hoyt Koch, MD  divalproex (DEPAKOTE ER) 500 MG 24 hr tablet TAKE 1 TABLET(500 MG) BY MOUTH DAILY 01/25/22   Hoyt Koch, MD  doxycycline (VIBRAMYCIN) 50 MG capsule Take 50 mg by mouth every evening. 03/31/20   [provider]  ezetimibe (ZETIA) 10 MG tablet Take 1 tablet (10 mg total) by mouth daily. 12/22/21   Josue Hector, MD  furosemide (LASIX) 20 MG tablet TAKE 1 TABLET(20 MG) BY MOUTH TWICE DAILY 12/14/21   Josue Hector, MD  gabapentin (NEURONTIN) 300 MG capsule TAKE 1 CAPSULE(300 MG) BY MOUTH TWICE DAILY 06/11/22   Hoyt Koch, MD  Glucosamine-Chondroitin (GLUCOSAMINE CHONDR COMPLEX PO) Take 500 mg by mouth 2 (two) times daily.      [provider]  guaiFENesin (MUCINEX) 600 MG 12 hr tablet Take 600 mg by mouth 2 (two) times daily.    [provider]  hydrALAZINE (APRESOLINE) 25 MG tablet TAKE 1 TABLET BY MOUTH EVERY MORNING AND AT BEDTIME 05/17/22   Camnitz, Will Hassell Done, MD  isosorbide mononitrate (IMDUR) 30 MG 24 hr tablet TAKE 1 TABLET BY MOUTH EVERY DAY 04/26/22   Josue Hector, MD  montelukast (SINGULAIR) 10 MG tablet TAKE 1 TABLET(10 MG) BY MOUTH AT BEDTIME 12/11/21   Hoyt Koch, MD  Multiple Vitamin (MULTIVITAMIN WITH MINERALS) TABS tablet Take 1 tablet by mouth every evening.    [provider]  nitroGLYCERIN (NITROSTAT) 0.4 MG SL tablet DISSOLVE 1 TABLET UNDER THE TONGUE EVERY 5 MINUTES FOR 3 DOSES AS NEEDED FOR CHEST PAIN 06/09/20   Josue Hector, MD  Omega-3 Fatty Acids (FISH OIL) 1200 MG CAPS Take 1,200 mg by mouth 2 (two) times daily.    [provider]  potassium chloride SA (KLOR-CON) 20 MEQ  tablet TAKE 1 TABLET BY MOUTH EVERY DAY 06/22/21   Josue Hector, MD  ramipril (ALTACE) 10 MG capsule TAKE 2 CAPSULES(20 MG) BY MOUTH DAILY 05/17/22   Josue Hector, MD  REPATHA SURECLICK 500 MG/ML SOAJ INJECT 1 ML UNDER THE SKIN EVERY 14 DAYS 03/22/22   Josue Hector, MD  rivaroxaban (XARELTO) 20 MG TABS tablet Take 1 tablet (20 mg total) by mouth daily with supper. 04/22/21   Josue Hector, MD  tamsulosin (FLOMAX) 0.4 MG CAPS capsule TAKE 1 CAPSULE(0.4 MG) BY MOUTH DAILY 05/03/22   Hoyt Koch, MD  vitamin C (ASCORBIC ACID) 500 MG tablet Take 500 mg by mouth daily.    [provider]  zolpidem (AMBIEN CR) 12.5 MG CR tablet TAKE 1 TABLET(12.5 MG) BY MOUTH AT BEDTIME AS NEEDED FOR SLEEP 03/19/22   Hoyt Koch, MD    Physical Exam    Vital Signs:  [name] Brown does not have vital signs available for review today.  Given telephonic nature of communication, physical exam is limited. AAOx3. NAD. Normal affect.  Speech and respirations are  unlabored.  Accessory Clinical Findings    None  Assessment & Plan    1.  Preoperative Cardiovascular Risk Assessment: The patient is doing well from a cardiac perspective. Therefore, based on ACC/AHA guidelines, the patient would be at acceptable risk for the planned procedure without further cardiovascular testing. The patient was advised that if he develops new symptoms prior to surgery to contact our office to arrange for a follow-up visit, and he verbalized understanding. According to the Revised Cardiac Risk Index (RCRI), his Perioperative Risk of Major Cardiac Event is (%): 0.4. His Functional Capacity in METs is: 7.59 according to the [facility] Activity Status Index (DASI).  Per office protocol, patient can hold Xarelto for 2 days prior to procedure.    A copy of this note will be routed to requesting surgeon.  Time:   Today, I have spent 7 minutes with the patient with telehealth technology discussing medical history, symptoms, and management plan.     Emmaline Life, NP-C    06/15/2022, 4:59 PM [address] Office [phone] Fax [phone]

## 2022-06-15 NOTE — Telephone Encounter (Signed)
The original prescription was reordered on 06/11/2022 by Hoyt Koch, MD. Marland KitchenPrincella Pellegrini

## 2022-06-16 ENCOUNTER — Encounter: Payer: Self-pay | Admitting: Nurse Practitioner

## 2022-06-16 ENCOUNTER — Telehealth: Payer: Self-pay | Admitting: Cardiology

## 2022-06-16 ENCOUNTER — Other Ambulatory Visit: Payer: Self-pay | Admitting: Cardiovascular Disease

## 2022-06-16 ENCOUNTER — Ambulatory Visit: Payer: PPO | Attending: Cardiology | Admitting: Nurse Practitioner

## 2022-06-16 DIAGNOSIS — Z01810 Encounter for preprocedural cardiovascular examination: Secondary | ICD-10-CM

## 2022-06-16 NOTE — Telephone Encounter (Signed)
Patient calling in to have his procedure on 10/31 reschedule. Please advise

## 2022-06-16 NOTE — Telephone Encounter (Signed)
Ablation p rocedure r/s to 10/25/22 per pt request. Aware I will be in touch at later date to go over instructions, etc. Patient verbalized understanding and agreeable to plan.

## 2022-07-08 NOTE — Patient Instructions (Signed)
SURGICAL WAITING ROOM VISITATION Patients having surgery or a procedure may have no more than 2 support people in the waiting area - these visitors may rotate.   Children under the age of 75 must have an adult with them who is not the patient. If the patient needs to stay at the hospital during part of their recovery, the visitor guidelines for inpatient rooms apply. Pre-op nurse will coordinate an appropriate time for 1 support person to accompany patient in pre-op.  This support person may not rotate.    Please refer to the [facility] website for the visitor guidelines for Inpatients (after your surgery is over and you are in a regular room).      Your procedure is scheduled on: 08-06-22   Report to [facility] Main Entrance    Report to admitting at 11:45 AM   Call this number if you have problems the morning of surgery [phone]   Do not eat food :After Midnight.   After Midnight you may have the following liquids until 11:00 AM DAY OF SURGERY  Water Non-Citrus Juices (without pulp, NO RED) Carbonated Beverages Black Coffee (NO MILK/CREAM OR CREAMERS, sugar ok)  Clear Tea (NO MILK/CREAM OR CREAMERS, sugar ok) regular and decaf                             Plain Jell-O (NO RED)                                           Fruit ices (not with fruit pulp, NO RED)                                     Popsicles (NO RED)                                                               Sports drinks like Gatorade (NO RED)                        If you have questions, please contact your surgeon's office.   FOLLOW  ANY ADDITIONAL PRE OP INSTRUCTIONS YOU RECEIVED FROM YOUR SURGEON'S OFFICE!!!     Oral Hygiene is also important to reduce your risk of infection.                                    Remember - BRUSH YOUR TEETH THE MORNING OF SURGERY WITH YOUR REGULAR TOOTHPASTE   Do NOT smoke after Midnight   Take these medicines the morning of surgery with A SIP OF WATER:    Divalproex  Ezetimibe  Gabapentin   Hydralazine  Isosorbide  Tamsulosin  Okay to use inhalers   Hold Xarelto 2 days before surgery                              You may not have any metal on your body  including  jewelry, and body piercing             Do not wear  lotions, powders,cologne, or deodorant              Men may shave face and neck.   Do not bring valuables to the hospital. [place].   Contacts, dentures or bridgework may not be worn into surgery.  DO NOT [place]. PHARMACY WILL DISPENSE MEDICATIONS LISTED ON YOUR MEDICATION LIST TO YOU DURING YOUR ADMISSION [name]!    Patients discharged on the day of surgery will not be allowed to drive home.  Someone NEEDS to stay with you for the first 24 hours after  anesthesia.   Special Instructions: Bring a copy of your healthcare power of attorney and living will documents the day of surgery if you haven't scanned  them before.              Please read over the following fact sheets you were given: IF [name] Brown    If you received a COVID test during your pre-op visit  it is requested that you wear a mask when out in public, stay away from anyone that may not be feeling well and notify your surgeon if you develop symptoms. If you test positive for Covid or have been in contact with anyone that has tested positive in the last 10 days please notify you surgeon.  [facility] - Preparing for Surgery Before surgery, you can play an important role.  Because skin is not sterile, your skin needs to be as free of germs as possible.  You can reduce the number of germs on your skin by washing with CHG (chlorahexidine gluconate) soap before surgery.  CHG is an antiseptic cleaner which kills germs and bonds with the skin to continue killing germs even after washing. Please DO NOT use if you  have an allergy to CHG or antibacterial soaps.  If your skin becomes reddened/irritated stop using the CHG and inform your nurse when you arrive at Short Stay. Do not shave (including legs and underarms) for at least 48 hours prior to the first CHG shower.  You may shave your face/neck.  Please follow these instructions carefully:  1.  Shower with CHG Soap the night before surgery and the  morning of surgery.  2.  If you choose to wash your hair, wash your hair first as usual with your normal  shampoo.  3.  After you shampoo, rinse your hair and body thoroughly to remove the shampoo.                             4.  Use CHG as you would any other liquid soap.  You can apply chg directly to the skin and wash.  Gently with a scrungie or clean washcloth.  5.  Apply the CHG Soap to your body ONLY FROM THE NECK DOWN.   Do   not use on face/ open                           Wound or open sores. Avoid contact with eyes, ears mouth and   genitals (private parts).  Wash face,  Genitals (private parts) with your normal soap.             6.  Wash thoroughly, paying special attention to the area where your    surgery  will be performed.  7.  Thoroughly rinse your body with warm water from the neck down.  8.  DO NOT shower/wash with your normal soap after using and rinsing off the CHG Soap.                9.  Pat yourself dry with a clean towel.            10.  Wear clean pajamas.            11.  Place clean sheets on your bed the night of your first shower and do not  sleep with pets. Day of Surgery : Do not apply any lotions/deodorants the morning of surgery.  Please wear clean clothes to the hospital/surgery center.  FAILURE TO FOLLOW THESE INSTRUCTIONS MAY RESULT IN THE CANCELLATION OF YOUR SURGERY  PATIENT SIGNATURE_________________________________  NURSE SIGNATURE__________________________________  ________________________________________________________________________

## 2022-07-08 NOTE — Progress Notes (Addendum)
COVID Vaccine Completed:  Yes  Date of COVID positive in last 90 days:  No  PCP - Pricilla Holm, MD Cardiologist - Jenkins Rouge, MD Electrophysiologist - Allegra Lai, MD  Cardiac clearance in Epic dated 06-16-22 by Christen Bame, NP-C  Chest x-ray - N/A EKG - 04-05-22 Epic Stress Test - 2020 Epic ECHO - greater than 2 years Epic Cardiac Cath - 2015 Pacemaker/ICD device last checked: Spinal Cord Stimulator: N/A Cardiac CT - 03-17-20 Epic Holter monitor - 2020 Epic  Bowel Prep - N/A  Sleep Study -  Yes, +sleep apnea CPAP - Inspire.  Patient will bring remote  Fasting Blood Sugar - N/A Checks Blood Sugar _____ times a day  Blood Thinner Instructions:  Xarelto.  Hold x2 days.  Patient is aware Aspirin Instructions:  ASA 81 Last Dose:  Activity level:  Can go up a flight of stairs and perform activities of daily living without stopping and without symptoms of chest pain or shortness of breath.  Patient states that he will have shortness of breath with multiple flights of stairs due to Afib.    Anesthesia review:  CAD, Hx of MI and CABG, aortic aneurysm, Afib, HTN, OSA.  Hx of Afib ablation  Patient denies shortness of breath, fever, cough and chest pain at PAT appointment  Patient verbalized understanding of instructions that were given to them at the PAT appointment. Patient was also instructed that they will need to review over the PAT instructions again at home before surgery.

## 2022-07-13 ENCOUNTER — Ambulatory Visit (HOSPITAL_COMMUNITY): Payer: Self-pay | Admitting: Surgery

## 2022-07-13 NOTE — Progress Notes (Signed)
Sent message, via epic in basket, requesting orders in epic from surgeon.  

## 2022-07-20 ENCOUNTER — Encounter (HOSPITAL_COMMUNITY): Payer: Self-pay

## 2022-07-20 ENCOUNTER — Other Ambulatory Visit: Payer: Self-pay

## 2022-07-20 ENCOUNTER — Encounter (HOSPITAL_COMMUNITY)
Admission: RE | Admit: 2022-07-20 | Discharge: 2022-07-20 | Disposition: A | Discharge status: Home / Self Care | Payer: PPO | Source: Ambulatory Visit | Attending: Surgery | Admitting: Surgery

## 2022-07-20 VITALS — BP 131/76 | HR 62 | Temp 98.0°F | Resp 16 | Ht 70.0 in | Wt 185.2 lb

## 2022-07-20 DIAGNOSIS — Z01818 Encounter for other preprocedural examination: Secondary | ICD-10-CM | POA: Diagnosis not present

## 2022-07-20 DIAGNOSIS — I2510 Atherosclerotic heart disease of native coronary artery without angina pectoris: Secondary | ICD-10-CM | POA: Diagnosis not present

## 2022-07-20 LAB — BASIC METABOLIC PANEL
Anion gap: 8 (ref 5–15)
BUN: 26 mg/dL — ABNORMAL HIGH (ref 8–23)
CO2: 24 mmol/L (ref 22–32)
Calcium: 9.6 mg/dL (ref 8.9–10.3)
Chloride: 104 mmol/L (ref 98–111)
Creatinine, Ser: 1.16 mg/dL (ref 0.61–1.24)
GFR, Estimated: >60 mL/min (ref >60)
Glucose, Bld: 119 mg/dL — ABNORMAL HIGH (ref 70–99)
Potassium: 4.1 mmol/L (ref 3.5–5.1)
Sodium: 136 mmol/L (ref 135–145)

## 2022-07-20 LAB — CBC
HCT: 41.4 % (ref 39.0–52.0)
Hemoglobin: 13.8 g/dL (ref 13.0–17.0)
MCH: 33.2 pg (ref 26.0–34.0)
MCHC: 33.3 g/dL (ref 30.0–36.0)
MCV: 99.5 fL (ref 80.0–100.0)
Platelets: 202 10*3/uL (ref 150–400)
RBC: 4.16 MIL/uL — ABNORMAL LOW (ref 4.22–5.81)
RDW: 14.4 % (ref 11.5–15.5)
WBC: 6 10*3/uL (ref 4.0–10.5)
nRBC: 0 % (ref 0.0–0.2)

## 2022-07-23 NOTE — Progress Notes (Signed)
Anesthesia Chart Review   Case: [id number] Date/Time: 08/06/22 1345   Procedure: LAPAROSCOPIC BILATERAL INGUINAL HERNIA REPAIR WITH MESH AND PRIMARY CLOSURE OF UMBILICAL HERNIA (Bilateral)   Anesthesia type: General   Pre-op diagnosis: BILATERAL INGUINAL AND UMBILICAL HERNIAS   Location: [facility] ROOM 02 / [facility]   Surgeons: Felicie Morn, MD       DISCUSSION:former smoker with h/o HTN, CAD (CABG), atrial fibrillation, PVD s/p EVAR, sleep apnea, bilateral inguinal and umbilical hernia scheduled for above procedure 08/06/2022 with Dr. Louanna Raw.   Pt last seen by cardiology 06/16/2022. Per OV note, "Preoperative Cardiovascular Risk Assessment: The patient is doing well from a cardiac perspective. Therefore, based on ACC/AHA guidelines, the patient would be at acceptable risk for the planned procedure without further cardiovascular testing. The patient was advised that if he develops new symptoms prior to surgery to contact our office to arrange for a follow-up visit, and he verbalized understanding. According to the Revised Cardiac Risk Index (RCRI), his Perioperative Risk of Major Cardiac Event is (%): 0.4. His Functional Capacity in METs is: 7.59 according to the [facility] Activity Status Index (DASI).   Per office protocol, patient can hold Xarelto for 2 days prior to procedure.  "  Anticipate pt can proceed with planned procedure barring acute status change.   VS: BP 131/76   Pulse 62   Temp 36.7 C (Oral)   Resp 16   Ht '5\' 10"'$  (1.778 m)   Wt 84 kg   SpO2 99%   BMI 26.57 kg/m   PROVIDERS: Hoyt Koch, MD is PCP  Cardiologist - Jenkins Rouge, MD  Electrophysiologist - Allegra Lai, MD LABS: Labs reviewed: Acceptable for surgery. (all labs ordered are listed, but only abnormal results are displayed)  Labs Reviewed  BASIC METABOLIC PANEL - Abnormal; Notable for the following components:      Result Value   Glucose, Bld 119 (*)    BUN 26 (*)    All other components  within normal limits  CBC - Abnormal; Notable for the following components:   RBC 4.16 (*)    All other components within normal limits     IMAGES:   EKG:   CV: Echo 01/21/2020 1. Left ventricular ejection fraction, by estimation, is 55 to 60%. The  left ventricle has normal function. Left ventricular endocardial border  not optimally defined to evaluate regional wall motion. There is mild left  ventricular hypertrophy. Left  ventricular diastolic parameters are consistent with Grade II diastolic  dysfunction (pseudonormalization).   2. Right ventricular systolic function is normal. The right ventricular  size is mildly enlarged. There is mildly elevated pulmonary artery  systolic pressure.   3. Left atrial size was moderately dilated.   4. Right atrial size was mildly dilated.   5. The mitral valve is normal in structure. Mild mitral valve  regurgitation.   6. The aortic valve is tricuspid. Aortic valve regurgitation is mild.  Mild aortic valve sclerosis is present, with no evidence of aortic valve  stenosis.   7. The inferior vena cava is normal in size with greater than 50%  respiratory variability, suggesting right atrial pressure of 3 mmHg.   Myocardial Perfusion 11/08/2018 The left ventricular ejection fraction is normal (55-65%). Nuclear stress EF: 56%. Post CABG septal wall hypokinesis. There was no ST segment deviation noted during stress. There is mildly reduced radiotracer uptake at both rest and stress in the anterior, lateral and inferolateral distributions. No ischemia identified. (POST CABG) This  is a low risk study. Past Medical History:  Diagnosis Date   Aortic aneurysm [facility]) Endograf 2009   Arthritis    Atrial fibrillation (HCC)    Bradycardia    CAD (coronary artery disease) CABG 03/2000   Median sternotomy for coronary artery bypass grafting x 3 (left   HTN (hypertension)    Hx of CABG 2001   severe 3 vessel disease   Hx of colonic polyp 06/2010    Hyperplastic    Hyperlipidemia    Myocardial infarction [facility])    Sleep apnea     Past Surgical History:  Procedure Laterality Date   ATRIAL FIBRILLATION ABLATION N/A 03/19/2020   Procedure: ATRIAL FIBRILLATION ABLATION;  Surgeon: Constance Haw, MD;  Location: [place] CV LAB;  Service: Cardiovascular;  Laterality: N/A;   CARDIOVERSION N/A 11/28/2018   Procedure: CARDIOVERSION;  Surgeon: Elouise Munroe, MD;  Location[facility] ENDOSCOPY;  Service: Cardiovascular;  Laterality: N/A;   CARDIOVERSION N/A 01/13/2021   Procedure: CARDIOVERSION;  Surgeon: Pixie Casino, MD;  Location: [facility] ENDOSCOPY;  Service: Cardiovascular;  Laterality: N/A;   COLONOSCOPY     CORONARY ARTERY BYPASS GRAFT     AAA stent graft on 12/01/2007 at [facility]  coronary bypass in 2001. Dr. Roxy Manns.   DRUG INDUCED ENDOSCOPY Right 02/18/2020   Procedure: DRUG INDUCED ENDOSCOPY;  Surgeon: Melida Quitter, MD;  Location: [place];  Service: ENT;  Laterality: Right;   FOOT SURGERY Left 07/19/2017   IMPLANTATION OF HYPOGLOSSAL NERVE STIMULATOR Right 07/30/2020   Procedure: IMPLANTATION OF HYPOGLOSSAL NERVE STIMULATOR Implantation of Chest wall respirator Sensor Electrode Electronic Analysis of Implanted Neurostimulator Pulse Generator;  Surgeon: Melida Quitter, MD;  Location: [place];  Service: ENT;  Laterality: Right;   LEFT HEART CATHETERIZATION WITH CORONARY ANGIOGRAM N/A 10/26/2013   Procedure: LEFT HEART CATHETERIZATION WITH CORONARY ANGIOGRAM;  Surgeon: Peter M Martinique, MD;  Location[facility] CATH LAB;  Service: Cardiovascular;  Laterality: N/A;   MASS EXCISION Right 08/10/2019   Procedure: EXCISION OF CYST FROM RIGHT SCALP;  Surgeon: Jovita Kussmaul, MD;  Location: [place];  Service: General;  Laterality: Right;    MEDICATIONS:  acetaminophen (TYLENOL) 500 MG tablet   albuterol (VENTOLIN HFA) 108 (90 Base) MCG/ACT inhaler   aspirin EC 81 MG EC tablet    Biotin 10000 MCG TABS   cholecalciferol (VITAMIN D) 25 MCG (1000 UT) tablet   cyclobenzaprine (FLEXERIL) 5 MG tablet   diclofenac sodium (VOLTAREN) 1 % GEL   divalproex (DEPAKOTE ER) 500 MG 24 hr tablet   doxycycline (VIBRAMYCIN) 50 MG capsule   ezetimibe (ZETIA) 10 MG tablet   furosemide (LASIX) 20 MG tablet   gabapentin (NEURONTIN) 300 MG capsule   Glucosamine-Chondroitin (GLUCOSAMINE CHONDR COMPLEX PO)   guaiFENesin (MUCINEX) 600 MG 12 hr tablet   hydrALAZINE (APRESOLINE) 25 MG tablet   isosorbide mononitrate (IMDUR) 30 MG 24 hr tablet   montelukast (SINGULAIR) 10 MG tablet   Multiple Vitamin (MULTIVITAMIN WITH MINERALS) TABS tablet   nitroGLYCERIN (NITROSTAT) 0.4 MG SL tablet   Omega-3 Fatty Acids (FISH OIL) 1200 MG CAPS   potassium chloride SA (KLOR-CON M) 20 MEQ tablet   ramipril (ALTACE) 10 MG capsule   REPATHA SURECLICK 409 MG/ML SOAJ   tamsulosin (FLOMAX) 0.4 MG CAPS capsule   vitamin C (ASCORBIC ACID) 500 MG tablet   XARELTO 20 MG TABS tablet   zolpidem (AMBIEN CR) 12.5 MG CR tablet   No current facility-administered  medications for this encounter.    Konrad Felix Ward, PA-C [facility] [phone]

## 2022-07-23 NOTE — Anesthesia Preprocedure Evaluation (Addendum)
Anesthesia Evaluation  Patient identified by MRN, date of birth, ID band Patient awake    Reviewed: Allergy & Precautions, NPO status , Patient's Chart, lab work & pertinent test results  Airway Mallampati: II  TM Distance: >3 FB Neck ROM: Full    Dental  (+) Teeth Intact, Dental Advisory Given   Pulmonary sleep apnea , former smoker,    breath sounds clear to auscultation       Cardiovascular hypertension, Pt. on medications + CAD, + Past MI and + CABG  + dysrhythmias Atrial Fibrillation  Rhythm:Regular Rate:Normal  Echo: 1. Left ventricular ejection fraction, by estimation, is 55 to 60%. The  left ventricle has normal function. Left ventricular endocardial border  not optimally defined to evaluate regional wall motion. There is mild left  ventricular hypertrophy. Left  ventricular diastolic parameters are consistent with Grade II diastolic  dysfunction (pseudonormalization).  2. Right ventricular systolic function is normal. The right ventricular  size is mildly enlarged. There is mildly elevated pulmonary artery  systolic pressure.  3. Left atrial size was moderately dilated.  4. Right atrial size was mildly dilated.  5. The mitral valve is normal in structure. Mild mitral valve  regurgitation.  6. The aortic valve is tricuspid. Aortic valve regurgitation is mild.  Mild aortic valve sclerosis is present, with no evidence of aortic valve  stenosis.  7. The inferior vena cava is normal in size with greater than 50%  respiratory variability, suggesting right atrial pressure of 3 mmHg.    Neuro/Psych  Headaches, negative psych ROS   GI/Hepatic negative GI ROS, Neg liver ROS,   Endo/Other  negative endocrine ROS  Renal/GU negative Renal ROS     Musculoskeletal  (+) Arthritis ,   Abdominal Normal abdominal exam  (+)   Peds  Hematology negative hematology ROS (+)   Anesthesia Other Findings    Reproductive/Obstetrics                           Anesthesia Physical Anesthesia Plan  ASA: 3  Anesthesia Plan: General   Post-op Pain Management:    Induction: Intravenous  PONV Risk Score and Plan: 3 and Ondansetron, Dexamethasone and Midazolam  Airway Management Planned: Oral ETT  Additional Equipment: None  Intra-op Plan:   Post-operative Plan: Extubation in OR  Informed Consent: I have reviewed the patients History and Physical, chart, labs and discussed the procedure including the risks, benefits and alternatives for the proposed anesthesia with the patient or authorized representative who has indicated his/her understanding and acceptance.     Dental advisory given  Plan Discussed with: CRNA  Anesthesia Plan Comments: (See PAT note 07/20/2022)      Anesthesia Quick Evaluation

## 2022-07-26 ENCOUNTER — Other Ambulatory Visit: Payer: Self-pay | Admitting: Internal Medicine

## 2022-07-26 DIAGNOSIS — G44009 Cluster headache syndrome, unspecified, not intractable: Secondary | ICD-10-CM

## 2022-08-03 ENCOUNTER — Other Ambulatory Visit (HOSPITAL_BASED_OUTPATIENT_CLINIC_OR_DEPARTMENT_OTHER): Payer: PPO

## 2022-08-06 ENCOUNTER — Encounter (HOSPITAL_COMMUNITY): Payer: Self-pay | Admitting: Surgery

## 2022-08-06 ENCOUNTER — Encounter (HOSPITAL_COMMUNITY)
Admission: RE | Disposition: A | Discharge status: Home / Self Care | Payer: Self-pay | Source: Ambulatory Visit | Attending: Surgery

## 2022-08-06 ENCOUNTER — Ambulatory Visit (HOSPITAL_COMMUNITY): Payer: PPO | Admitting: Physician Assistant

## 2022-08-06 ENCOUNTER — Ambulatory Visit (HOSPITAL_COMMUNITY)
Admission: RE | Admit: 2022-08-06 | Discharge: 2022-08-06 | Disposition: A | Discharge status: Home / Self Care | Payer: PPO | Source: Ambulatory Visit | Attending: Surgery | Admitting: Surgery

## 2022-08-06 ENCOUNTER — Other Ambulatory Visit: Payer: Self-pay

## 2022-08-06 ENCOUNTER — Ambulatory Visit (HOSPITAL_BASED_OUTPATIENT_CLINIC_OR_DEPARTMENT_OTHER): Payer: PPO | Admitting: Anesthesiology

## 2022-08-06 DIAGNOSIS — G4730 Sleep apnea, unspecified: Secondary | ICD-10-CM | POA: Insufficient documentation

## 2022-08-06 DIAGNOSIS — I10 Essential (primary) hypertension: Secondary | ICD-10-CM | POA: Diagnosis not present

## 2022-08-06 DIAGNOSIS — I4891 Unspecified atrial fibrillation: Secondary | ICD-10-CM | POA: Diagnosis not present

## 2022-08-06 DIAGNOSIS — I2510 Atherosclerotic heart disease of native coronary artery without angina pectoris: Secondary | ICD-10-CM | POA: Insufficient documentation

## 2022-08-06 DIAGNOSIS — Z87891 Personal history of nicotine dependence: Secondary | ICD-10-CM | POA: Diagnosis not present

## 2022-08-06 DIAGNOSIS — M1990 Unspecified osteoarthritis, unspecified site: Secondary | ICD-10-CM | POA: Diagnosis not present

## 2022-08-06 DIAGNOSIS — I252 Old myocardial infarction: Secondary | ICD-10-CM | POA: Diagnosis not present

## 2022-08-06 DIAGNOSIS — K4020 Bilateral inguinal hernia, without obstruction or gangrene, not specified as recurrent: Secondary | ICD-10-CM | POA: Diagnosis not present

## 2022-08-06 DIAGNOSIS — K429 Umbilical hernia without obstruction or gangrene: Secondary | ICD-10-CM | POA: Diagnosis not present

## 2022-08-06 HISTORY — PX: INGUINAL HERNIA REPAIR: SHX194

## 2022-08-06 SURGERY — REPAIR, HERNIA, INGUINAL, BILATERAL, LAPAROSCOPIC
Anesthesia: General | Site: Abdomen | Laterality: Bilateral

## 2022-08-06 MED ORDER — ONDANSETRON HCL 4 MG/2ML IJ SOLN
INTRAMUSCULAR | Status: AC
  Filled 2022-08-06: qty 2

## 2022-08-06 MED ORDER — ROCURONIUM BROMIDE 10 MG/ML (PF) SYRINGE
PREFILLED_SYRINGE | INTRAVENOUS | Status: AC
  Filled 2022-08-06: qty 10

## 2022-08-06 MED ORDER — SUGAMMADEX SODIUM 200 MG/2ML IV SOLN
INTRAVENOUS | Status: DC | PRN
  Administered 2022-08-06: 200 mg via INTRAVENOUS

## 2022-08-06 MED ORDER — OXYCODONE HCL 5 MG/5ML PO SOLN: 5.0000 mg | Freq: Once | ORAL | Status: AC | PRN

## 2022-08-06 MED ORDER — OXYCODONE-ACETAMINOPHEN 5-325 MG PO TABS: 1.0000 | ORAL_TABLET | ORAL | 0 refills | Status: DC | PRN

## 2022-08-06 MED ORDER — PROPOFOL 10 MG/ML IV BOLUS
INTRAVENOUS | Status: AC
  Filled 2022-08-06: qty 20

## 2022-08-06 MED ORDER — ACETAMINOPHEN 160 MG/5ML PO SOLN: 325.0000 mg | ORAL | Status: DC | PRN

## 2022-08-06 MED ORDER — GABAPENTIN 300 MG PO CAPS
ORAL_CAPSULE | ORAL | Status: AC
  Administered 2022-08-06: 300 mg via ORAL
  Filled 2022-08-06: qty 1

## 2022-08-06 MED ORDER — CHLORHEXIDINE GLUCONATE CLOTH 2 % EX PADS: 6.0000 | MEDICATED_PAD | Freq: Once | CUTANEOUS | Status: DC

## 2022-08-06 MED ORDER — PROMETHAZINE HCL 25 MG/ML IJ SOLN: 6.2500 mg | INTRAMUSCULAR | Status: DC | PRN

## 2022-08-06 MED ORDER — GABAPENTIN 300 MG PO CAPS: 300.0000 mg | ORAL_CAPSULE | ORAL | Status: AC

## 2022-08-06 MED ORDER — LIDOCAINE 2% (20 MG/ML) 5 ML SYRINGE
INTRAMUSCULAR | Status: DC | PRN
  Administered 2022-08-06: 60 mg via INTRAVENOUS

## 2022-08-06 MED ORDER — CEFAZOLIN SODIUM-DEXTROSE 2-4 GM/100ML-% IV SOLN
INTRAVENOUS | Status: AC
  Filled 2022-08-06: qty 100

## 2022-08-06 MED ORDER — ACETAMINOPHEN 500 MG PO TABS
ORAL_TABLET | ORAL | Status: AC
  Administered 2022-08-06: 1000 mg via ORAL
  Filled 2022-08-06: qty 2

## 2022-08-06 MED ORDER — BUPIVACAINE LIPOSOME 1.3 % IJ SUSP: 20.0000 mL | Freq: Once | INTRAMUSCULAR | Status: DC

## 2022-08-06 MED ORDER — ROCURONIUM BROMIDE 10 MG/ML (PF) SYRINGE
PREFILLED_SYRINGE | INTRAVENOUS | Status: DC | PRN
  Administered 2022-08-06: 60 mg via INTRAVENOUS

## 2022-08-06 MED ORDER — FENTANYL CITRATE (PF) 250 MCG/5ML IJ SOLN
INTRAMUSCULAR | Status: DC | PRN
  Administered 2022-08-06: 100 ug via INTRAVENOUS
  Administered 2022-08-06: 50 ug via INTRAVENOUS
  Administered 2022-08-06: 100 ug via INTRAVENOUS

## 2022-08-06 MED ORDER — ACETAMINOPHEN 325 MG PO TABS: 325.0000 mg | ORAL_TABLET | ORAL | Status: DC | PRN

## 2022-08-06 MED ORDER — MIDAZOLAM HCL 2 MG/2ML IJ SOLN
INTRAMUSCULAR | Status: DC | PRN
  Administered 2022-08-06: 2 mg via INTRAVENOUS

## 2022-08-06 MED ORDER — PROPOFOL 10 MG/ML IV BOLUS
INTRAVENOUS | Status: DC | PRN
  Administered 2022-08-06: 130 mg via INTRAVENOUS

## 2022-08-06 MED ORDER — CHLORHEXIDINE GLUCONATE 0.12 % MT SOLN
15.0000 mL | Freq: Once | OROMUCOSAL | Status: AC
  Administered 2022-08-06: 15 mL via OROMUCOSAL

## 2022-08-06 MED ORDER — CEFAZOLIN SODIUM-DEXTROSE 2-4 GM/100ML-% IV SOLN
2.0000 g | INTRAVENOUS | Status: AC
  Administered 2022-08-06: 2 g via INTRAVENOUS

## 2022-08-06 MED ORDER — KETAMINE HCL 10 MG/ML IJ SOLN
INTRAMUSCULAR | Status: DC | PRN
  Administered 2022-08-06: 35 mg via INTRAVENOUS

## 2022-08-06 MED ORDER — LIDOCAINE HCL (PF) 2 % IJ SOLN
INTRAMUSCULAR | Status: AC
  Filled 2022-08-06: qty 5

## 2022-08-06 MED ORDER — MIDAZOLAM HCL 2 MG/2ML IJ SOLN
INTRAMUSCULAR | Status: AC
  Filled 2022-08-06: qty 2

## 2022-08-06 MED ORDER — OXYCODONE HCL 5 MG PO TABS
5.0000 mg | ORAL_TABLET | Freq: Once | ORAL | Status: AC | PRN
  Administered 2022-08-06: 5 mg via ORAL

## 2022-08-06 MED ORDER — ORAL CARE MOUTH RINSE: 15.0000 mL | Freq: Once | OROMUCOSAL | Status: AC

## 2022-08-06 MED ORDER — ACETAMINOPHEN 10 MG/ML IV SOLN: 1000.0000 mg | Freq: Once | INTRAVENOUS | Status: DC | PRN

## 2022-08-06 MED ORDER — FENTANYL CITRATE PF 50 MCG/ML IJ SOSY
PREFILLED_SYRINGE | INTRAMUSCULAR | Status: AC
  Filled 2022-08-06: qty 3

## 2022-08-06 MED ORDER — ACETAMINOPHEN 500 MG PO TABS: 1000.0000 mg | ORAL_TABLET | ORAL | Status: AC

## 2022-08-06 MED ORDER — BUPIVACAINE LIPOSOME 1.3 % IJ SUSP
INTRAMUSCULAR | Status: AC
  Filled 2022-08-06: qty 20

## 2022-08-06 MED ORDER — AMISULPRIDE (ANTIEMETIC) 5 MG/2ML IV SOLN: 10.0000 mg | Freq: Once | INTRAVENOUS | Status: DC | PRN

## 2022-08-06 MED ORDER — BUPIVACAINE LIPOSOME 1.3 % IJ SUSP
INTRAMUSCULAR | Status: DC | PRN
  Administered 2022-08-06: 20 mL

## 2022-08-06 MED ORDER — BUPIVACAINE-EPINEPHRINE (PF) 0.25% -1:200000 IJ SOLN
INTRAMUSCULAR | Status: DC | PRN
  Administered 2022-08-06: 30 mL

## 2022-08-06 MED ORDER — BUPIVACAINE-EPINEPHRINE (PF) 0.25% -1:200000 IJ SOLN
INTRAMUSCULAR | Status: AC
  Filled 2022-08-06: qty 30

## 2022-08-06 MED ORDER — ONDANSETRON HCL 4 MG/2ML IJ SOLN
INTRAMUSCULAR | Status: DC | PRN
  Administered 2022-08-06: 4 mg via INTRAVENOUS

## 2022-08-06 MED ORDER — DEXAMETHASONE SODIUM PHOSPHATE 10 MG/ML IJ SOLN
INTRAMUSCULAR | Status: AC
  Filled 2022-08-06: qty 1

## 2022-08-06 MED ORDER — DEXAMETHASONE SODIUM PHOSPHATE 10 MG/ML IJ SOLN
INTRAMUSCULAR | Status: DC | PRN
  Administered 2022-08-06: 5 mg via INTRAVENOUS

## 2022-08-06 MED ORDER — FENTANYL CITRATE PF 50 MCG/ML IJ SOSY
25.0000 ug | PREFILLED_SYRINGE | INTRAMUSCULAR | Status: DC | PRN
  Administered 2022-08-06 (×3): 50 ug via INTRAVENOUS

## 2022-08-06 MED ORDER — KETAMINE HCL 50 MG/5ML IJ SOSY
PREFILLED_SYRINGE | INTRAMUSCULAR | Status: AC
  Filled 2022-08-06: qty 5

## 2022-08-06 MED ORDER — FENTANYL CITRATE (PF) 250 MCG/5ML IJ SOLN
INTRAMUSCULAR | Status: AC
  Filled 2022-08-06: qty 5

## 2022-08-06 MED ORDER — OXYCODONE HCL 5 MG PO TABS
ORAL_TABLET | ORAL | Status: AC
  Filled 2022-08-06: qty 1

## 2022-08-06 MED ORDER — LACTATED RINGERS IR SOLN
Status: DC | PRN
  Administered 2022-08-06: 1000 mL

## 2022-08-06 MED ORDER — LACTATED RINGERS IV SOLN: INTRAVENOUS | Status: DC

## 2022-08-06 SURGICAL SUPPLY — 35 items
BAG COUNTER SPONGE SURGICOUNT (BAG) ×1 IMPLANT
CABLE HIGH FREQUENCY MONO STRZ (ELECTRODE) ×1 IMPLANT
CHLORAPREP W/TINT 26 (MISCELLANEOUS) ×1 IMPLANT
DERMABOND ADVANCED .7 DNX12 (GAUZE/BANDAGES/DRESSINGS) ×1 IMPLANT
ELECT REM PT RETURN 15FT ADLT (MISCELLANEOUS) ×1 IMPLANT
GLOVE BIO SURGEON STRL SZ7.5 (GLOVE) ×1 IMPLANT
GLOVE INDICATOR 8.0 STRL GRN (GLOVE) ×1 IMPLANT
GOWN STRL REUS W/ TWL XL LVL3 (GOWN DISPOSABLE) ×2 IMPLANT
GOWN STRL REUS W/TWL XL LVL3 (GOWN DISPOSABLE) ×2
GRASPER SUT TROCAR 14GX15 (MISCELLANEOUS) ×1 IMPLANT
IRRIG SUCT STRYKERFLOW 2 WTIP (MISCELLANEOUS)
IRRIGATION SUCT STRKRFLW 2 WTP (MISCELLANEOUS) IMPLANT
KIT BASIN OR (CUSTOM PROCEDURE TRAY) ×1 IMPLANT
KIT TURNOVER KIT A (KITS) IMPLANT
MARKER SKIN DUAL TIP RULER LAB (MISCELLANEOUS) ×1 IMPLANT
MESH 3DMAX 5X7 LT XLRG (Mesh General) IMPLANT
MESH 3DMAX 5X7 RT XLRG (Mesh General) IMPLANT
NDL INSUFFLATION 14GA 120MM (NEEDLE) ×2 IMPLANT
NEEDLE INSUFFLATION 14GA 120MM (NEEDLE) ×2 IMPLANT
RELOAD STAPLE 4.0 BLU F/HERNIA (INSTRUMENTS) IMPLANT
RELOAD STAPLE 4.8 BLK F/HERNIA (STAPLE) ×1 IMPLANT
RELOAD STAPLE HERNIA 4.0 BLUE (INSTRUMENTS) IMPLANT
RELOAD STAPLE HERNIA 4.8 BLK (STAPLE) ×2 IMPLANT
SCISSORS LAP 5X35 DISP (ENDOMECHANICALS) ×1 IMPLANT
SET TUBE SMOKE EVAC HIGH FLOW (TUBING) ×1 IMPLANT
SPIKE FLUID TRANSFER (MISCELLANEOUS) IMPLANT
STAPLER HERNIA 12 8.5 360D (INSTRUMENTS) ×1 IMPLANT
SUT MNCRL AB 4-0 PS2 18 (SUTURE) ×1 IMPLANT
SUT VICRYL 0 UR6 27IN ABS (SUTURE) IMPLANT
TOWEL OR 17X26 10 PK STRL BLUE (TOWEL DISPOSABLE) ×1 IMPLANT
TRAY FOL W/BAG SLVR 16FR STRL (SET/KITS/TRAYS/PACK) ×1 IMPLANT
TRAY FOLEY W/BAG SLVR 16FR LF (SET/KITS/TRAYS/PACK)
TRAY LAPAROSCOPIC (CUSTOM PROCEDURE TRAY) ×1 IMPLANT
TROCAR ADV FIXATION 12X100MM (TROCAR) ×1 IMPLANT
TROCAR Z-THREAD OPTICAL 5X100M (TROCAR) ×2 IMPLANT

## 2022-08-06 NOTE — Transfer of Care (Signed)
Immediate Anesthesia Transfer of Care Note  Patient: [name] Brown  Procedure(s) Performed: LAPAROSCOPIC BILATERAL INGUINAL HERNIA REPAIR WITH MESH AND PRIMARY CLOSURE OF UMBILICAL HERNIA (Bilateral: Abdomen)  Patient Location: PACU  Anesthesia Type:General  Level of Consciousness: awake and drowsy  Airway & Oxygen Therapy: Patient Spontanous Breathing and Patient connected to face mask oxygen  Post-op Assessment: Report given to RN, Post -op Vital signs reviewed and stable and B/P 185/94  Pulse 76  Pulxe ox 97%  Post vital signs: Reviewed and stable  Last Vitals:  Vitals Value Taken Time  BP    Temp    Pulse    Resp    SpO2      Last Pain:  Vitals:   08/06/22 1228  TempSrc: Oral  PainSc: 0-No pain         Complications:  Encounter Notable Events  Notable Event Outcome Phase Comment  Difficult to intubate - expected  Intraprocedure Filed from anesthesia note documentation.

## 2022-08-06 NOTE — Op Note (Signed)
Patient: [name] Brown (21-Aug-1951, [id number])  Date of Surgery: 08/06/2022   Preoperative Diagnosis: BILATERAL INGUINAL AND UMBILICAL HERNIAS   Postoperative Diagnosis: BILATERAL INGUINAL AND UMBILICAL HERNIAS   Surgical Procedure: LAPAROSCOPIC BILATERAL INGUINAL HERNIA REPAIR WITH MESH AND PRIMARY CLOSURE OF UMBILICAL HERNIA:    Operative Team Members:  Surgeon(s) and Role:    * Diontay Rosencrans, Nickola Major, MD - Primary   Lossie Faes, MD - [facility] Resident assistant  Anesthesiologist: Effie Berkshire, MD CRNA: Lollie Sails, CRNA; Victoriano Lain, CRNA   Anesthesia: General   Fluids:  Total I/O In: 4431 [I.V.:1013; IV Piggyback:100] Out: 5 [VQMGQ:6]  Complications: None  Drains:  None  Specimen: None  Disposition:  PACU - hemodynamically stable.  Plan of Care: Discharge to home after PACU  Indications for Procedure: [name] Brown is a 71 y.o. male who presented with bilateral inguinal hernias and an umbilical hernia.  Findings:  Technique: Transabdominal preperitoneal (TAPP) Hernia Location: Indirect right inguinal, indirect left inguinal Mesh Size &Type:  Bard 3D max extra large right and extra large left inguinal hernia meshes Mesh Fixation: Endo-Universal hernia stapler  Infection status: Patient: Private Patient Elective Case Case: Elective Infection Present At Time Of Surgery (PATOS): None   Description of Procedure:  The patient was positioned supine, padded and secured to the bed, with both arms tucked.  The abdomen was widely prepped and draped.  A time out procedure was performed.  A 1 cm infraumbilical incision was made.  The abdomen was entered without trauma to the underlying viscera.  The abdomen was insufflated to 15 mm of Hg.  A 12 mm trocar was inserted at the periumbilical incision.  Additional 5 mm trocars were placed in the left and right abdomen.  There was no trauma to the underlying viscera.  There was an indirect hernia on the RIGHT.   Utilizing a transabdominal pre peritoneal technique (TAPP), a horizontal incision was made in the peritoneum, immediately below the umbilicus.  Dissection was carried out in the pre peritoneal space down to the level of the hernia sac which was reduced into the peritoneal cavity completely.  The cord contents were parietalized and preserved.  A large pre peritoneal dissection was performed to uncover the direct, indirect, femoral and obturator spaces.  Cooper's ligament was uncovered medially and the psoas muscle uncovered laterally.  The mesh, as documented above, was opened and advanced into the pre peritoneal position so that it more than adequately covered the indirect, direct, femoral and obturator spaces.  The mesh laid flat, with no inferior folds and covered the entire myopectineal orifice.  The mesh was fixated with the endo-universal hernia stapler to Cooper's ligament and the posterior aspect of the rectus muscle.  The peritoneal flap was closed with the same device.  There were no peritoneal defects or exposed mesh at the conclusion.  There was an indirect hernia on the LEFT.  Utilizing a transabdominal pre peritoneal technique (TAPP), a horizontal incision was made in the peritoneum, immediately below the umbilicus.  Dissection was carried out in the pre peritoneal space down to the level of the hernia sac which was reduced into the peritoneal cavity completely.  The cord contents were parietalized and preserved.  A large pre peritoneal dissection was performed to uncover the direct, indirect, femoral and obturator spaces.  Cooper's ligament was uncovered medially and the psoas muscle uncovered laterally.  The mesh, as documented above, was opened and advanced into the pre peritoneal position so that it  more than adequately covered the indirect, direct, femoral and obturator spaces.  The mesh laid flat, with no inferior folds and covered the entire myopectineal orifice.  The mesh was fixated with  the endo-universal hernia stapler to Cooper's ligament and the posterior aspect of the rectus muscle.  The peritoneal flap was closed with the same device.  There were no peritoneal defects or exposed mesh at the conclusion.  The umbilical trocar was removed and the fascial defect was closed with a 0 Vicryl suture.  The peritoneal cavity was completely desufflated, the trocars removed and the skin closed with 4-0 Monocryl subcuticular suture and skin glue.  All sponge and needle counts were correct at the end of the case.  [name] Raw, MD General, Bariatric, & Minimally Invasive Surgery [facility], [place]

## 2022-08-06 NOTE — Discharge Instructions (Signed)
 GROIN HERNIA REPAIR POST OPERATIVE INSTRUCTIONS  Thinking Clearly  The anesthesia may cause you to feel different for 1 or 2 days. Do not drive, drink alcohol, or make any big decisions for at least 2 days.  Nutrition When you wake up, you will be able to drink small amounts of liquid. If you do not feel sick, you can slowly advance your diet to regular foods. Continue to drink lots of fluids, usually about 8 to 10 glasses per day. Eat a high-fiber diet so you don't strain during bowel movements. High-Fiber Foods Foods high in fiber include beans, bran cereals and whole-grain breads, peas, dried fruit (figs, apricots, and dates), raspberries, blackberries, strawberries, sweet corn, broccoli, baked potatoes with skin, plums, pears, apples, greens, and nuts. Activity Slowly increase your activity. Be sure to get up and walk every hour or so to prevent blood clots. No heavy lifting or strenuous activity for 4 weeks following surgery to prevent hernias at your incision sites or recurrence of your hernia. It is normal to feel tired. You may need more sleep than usual.  Get your rest but make sure to get up and move around frequently to prevent blood clots and pneumonia.  Work and Return to School You can go back to work when you feel well enough. Discuss the timing with your surgeon. You can usually go back to school or work 1 week or less after an laparoscopic or an open repair. If your work requires heavy lifting or strenuous activity you need to be placed on light duty for 4 weeks following surgery. You can return to gym class, sports or other physical activities 4 weeks after surgery.  Wound Care You may experience significant bruising in the groin including into the scrotum in males.  Rest, elevating the groin and scrotum above the level of the heart, ice and compression with tight fitting underwear can help.  Always wash your hands before and after touching near your incision site. Do  not soak in a bathtub until cleared at your follow up appointment. You may take a shower 24 hours after surgery. A small amount of drainage from the incision is normal. If the drainage is thick and yellow or the site is red, you may have an infection, so call your surgeon. If you have a drain in one of your incisions, it will be taken out in office when the drainage stops. Steri-Strips will fall off in 7 to 10 days or they will be removed during your first office visit. If you have dermabond glue covering over the incision, allow the glue to flake off on its own. Protect the new skin, especially from the sun. The sun can burn and cause darker scarring. Your scar will heal in about 4 to 6 weeks and will become softer and continue to fade over the next year.  The cosmetic appearance of the incisions will improve over the course of the first year after surgery. Sensation around your incision will return in a few weeks or months.  Bowel Movements After intestinal surgery, you may have loose watery stools for several days. If watery diarrhea lasts longer than 3 days, contact your surgeon. Pain medication (narcotics) can cause constipation. Increase the fiber in your diet with high-fiber foods if you are constipated. You can take an over the counter stool softener like Colace to avoid constipation.  Additional over the counter medications can also be used if Colace isn't sufficient (for example, Milk of Magnesia or Miralax).    Pain The amount of pain is different for each person. Some people need only 1 to 3 doses of pain control medication, while others need more. Take alternating doses of tylenol and ibuprofen around the clock for the first five days following surgery.  This will provide a baseline of pain control and help with inflammation.  Take the narcotic pain medication in addition if needed for severe pain.  Contact Your Surgeon at [phone], if you have: Pain that will not go away Pain that  gets worse A fever of more than 101F (38.3C) Repeated vomiting Swelling, redness, bleeding, or bad-smelling drainage from your wound site Strong abdominal pain No bowel movement or unable to pass gas for 3 days Watery diarrhea lasting longer than 3 days  Pain Control The goal of pain control is to minimize pain, keep you moving and help you heal. Your surgical team will work with you on your pain plan. Most often a combination of therapies and medications are used to control your pain. You may also be given medication (local anesthetic) at the surgical site. This may help control your pain for several days. Extreme pain puts extra stress on your body at a time when your body needs to focus on healing. Do not wait until your pain has reached a level "10" or is unbearable before telling your doctor or nurse. It is much easier to control pain before it becomes severe. Following a laparoscopic procedure, pain is sometimes felt in the shoulder. This is due to the gas inserted into your abdomen during the procedure. Moving and walking helps to decrease the gas and the right shoulder pain.  Use the guide below for ways to manage your post-operative pain. Learn more by going to facs.org/safepaincontrol.  How Intense Is My Pain Common Therapies to Feel Better       I hardly notice my pain, and it does not interfere with my activities.  I notice my pain and it distracts me, but I can still do activities (sitting up, walking, standing).  Non-Medication Therapies  Ice (in a bag, applied over clothing at the surgical site), elevation, rest, meditation, massage, distraction (music, TV, play) walking and mild exercise Splinting the abdomen with pillows +  Non-Opioid Medications Acetaminophen (Tylenol) Non-steroidal anti-inflammatory drugs (NSAIDS) Aspirin, Ibuprofen (Motrin, Advil) Naproxen (Aleve) Take these as needed, when you feel pain. Both acetaminophen and NSAIDs help to decrease pain  and swelling (inflammation).      My pain is hard to ignore and is more noticeable even when I rest.  My pain interferes with my usual activities.  Non-Medication Therapies  +  Non-Opioid medications  Take on a regular schedule (around-the-clock) instead of as needed. (For example, Tylenol every 6 hours at 9:00 am, 3:00 pm, 9:00 pm, 3:00 am and Motrin every 6 hours at 12:00 am, 6:00 am, 12:00 pm, 6:00 pm)         I am focused on my pain, and I am not doing my daily activities.  I am groaning in pain, and I cannot sleep. I am unable to do anything.  My pain is as bad as it could be, and nothing else matters.  Non-Medication Therapies  +  Around-the-Clock Non-Opioid Medications  +  Short-acting opioids  Opioids should be used with other medications to manage severe pain. Opioids block pain and give a feeling of euphoria (feel high). Addiction, a serious side effect of opioids, is rare with short-term (a few days) use.  Examples of short-acting opioids   include: Tramadol (Ultram), Hydrocodone (Norco, Vicodin), Hydromorphone (Dilaudid), Oxycodone (Oxycontin)     The above directions have been adapted from the American College of [employer]geons Surgical Patient Education Program.  Please refer to the ACS website if needed: [email or web address]   Jyles Sontag, MD [facility] [address], [address], [place], [place]  [address] ?  [address] [address], [place], [place]   [address] [phone] ? [phone] ? FAX [phone] Web site: [email or web address]  

## 2022-08-06 NOTE — H&P (Signed)
Admitting Physician: Nickola Major Maxen Rowland  Service: General Surgery  CC: Hernia  Subjective   HPI: [name] Brown is an 71 y.o. male who is here for bilateral inguinal and umbilical hernia repair  Past Medical History:  Diagnosis Date   Aortic aneurysm [facility]) Endograf 2009   Arthritis    Atrial fibrillation (HCC)    Bradycardia    CAD (coronary artery disease) CABG 03/2000   Median sternotomy for coronary artery bypass grafting x 3 (left   HTN (hypertension)    Hx of CABG 2001   severe 3 vessel disease   Hx of colonic polyp 06/2010   Hyperplastic    Hyperlipidemia    Myocardial infarction [facility])    Sleep apnea     Past Surgical History:  Procedure Laterality Date   ATRIAL FIBRILLATION ABLATION N/A 03/19/2020   Procedure: ATRIAL FIBRILLATION ABLATION;  Surgeon: Constance Haw, MD;  Location: [place] CV LAB;  Service: Cardiovascular;  Laterality: N/A;   CARDIOVERSION N/A 11/28/2018   Procedure: CARDIOVERSION;  Surgeon: Elouise Munroe, MD;  Location: [facility] ENDOSCOPY;  Service: Cardiovascular;  Laterality: N/A;   CARDIOVERSION N/A 01/13/2021   Procedure: CARDIOVERSION;  Surgeon: Pixie Casino, MD;  Location: [facility] ENDOSCOPY;  Service: Cardiovascular;  Laterality: N/A;   COLONOSCOPY     CORONARY ARTERY BYPASS GRAFT     AAA stent graft on 12/01/2007 at [facility]  coronary bypass in 2001. Dr. Roxy Manns.   DRUG INDUCED ENDOSCOPY Right 02/18/2020   Procedure: DRUG INDUCED ENDOSCOPY;  Surgeon: Melida Quitter, MD;  Location: [place];  Service: ENT;  Laterality: Right;   FOOT SURGERY Left 07/19/2017   IMPLANTATION OF HYPOGLOSSAL NERVE STIMULATOR Right 07/30/2020   Procedure: IMPLANTATION OF HYPOGLOSSAL NERVE STIMULATOR Implantation of Chest wall respirator Sensor Electrode Electronic Analysis of Implanted Neurostimulator Pulse Generator;  Surgeon: Melida Quitter, MD;  Location: [place];  Service: ENT;  Laterality: Right;   LEFT  HEART CATHETERIZATION WITH CORONARY ANGIOGRAM N/A 10/26/2013   Procedure: LEFT HEART CATHETERIZATION WITH CORONARY ANGIOGRAM;  Surgeon: Peter M Martinique, MD;  Location: [facility] CATH LAB;  Service: Cardiovascular;  Laterality: N/A;   MASS EXCISION Right 08/10/2019   Procedure: EXCISION OF CYST FROM RIGHT SCALP;  Surgeon: Jovita Kussmaul, MD;  Location: [place];  Service: General;  Laterality: Right;    Family History  Problem Relation Age of Onset   Heart attack Mother    Heart disease Mother    Heart attack Father    Heart disease Father    Rectal cancer Neg Hx    Stomach cancer Neg Hx    Pancreatic cancer Neg Hx    Esophageal cancer Neg Hx    Colon polyps Neg Hx    Colon cancer Neg Hx     Social:  reports that he quit smoking about 30 years ago. His smoking use included cigarettes. He has a 60.00 pack-year smoking history. He has never used smokeless tobacco. He reports current drug use. Drug: Marijuana. He reports that he does not drink alcohol.  Allergies:  Allergies  Allergen Reactions   Statins     Myalgias with elevated CPKs    Medications: Current Outpatient Medications  Medication Instructions   acetaminophen (TYLENOL) 1,000 mg, Oral, Every 4 hours PRN   albuterol (VENTOLIN HFA) 108 (90 Base) MCG/ACT inhaler INHALE 2 PUFFS INTO THE LUNGS EVERY 6 HOURS AS NEEDED FOR WHEEZING OR SHORTNESS OF BREATH   ascorbic acid (VITAMIN  C) 500 mg, Oral, Daily   aspirin EC 81 mg, Oral, Daily   Biotin 10,000 mcg, Oral, Every morning   cholecalciferol (VITAMIN D3) 1,000 Units, Oral, Every evening   cyclobenzaprine (FLEXERIL) 5 mg, Oral, 3 times daily PRN   diclofenac sodium (VOLTAREN) 2 g, Topical, 4 times daily   divalproex (DEPAKOTE ER) 500 MG 24 hr tablet TAKE 1 TABLET(500 MG) BY MOUTH DAILY   doxycycline (VIBRAMYCIN) 50 mg, Oral, Every evening   ezetimibe (ZETIA) 10 mg, Oral, Daily   Fish Oil 1,200 mg, Oral, 2 times daily   furosemide (LASIX) 20 MG tablet TAKE 1  TABLET(20 MG) BY MOUTH TWICE DAILY   gabapentin (NEURONTIN) 300 MG capsule TAKE 1 CAPSULE(300 MG) BY MOUTH TWICE DAILY   Glucosamine-Chondroitin (GLUCOSAMINE CHONDR COMPLEX PO) 500 mg, Oral, 2 times daily   guaiFENesin (MUCINEX) 600 mg, Oral, 2 times daily   hydrALAZINE (APRESOLINE) 25 MG tablet TAKE 1 TABLET BY MOUTH EVERY MORNING AND AT BEDTIME   isosorbide mononitrate (IMDUR) 30 MG 24 hr tablet TAKE 1 TABLET BY MOUTH EVERY DAY   montelukast (SINGULAIR) 10 MG tablet TAKE 1 TABLET(10 MG) BY MOUTH AT BEDTIME   Multiple Vitamin (MULTIVITAMIN WITH MINERALS) TABS tablet 1 tablet, Oral, Every evening   nitroGLYCERIN (NITROSTAT) 0.4 MG SL tablet DISSOLVE 1 TABLET UNDER THE TONGUE EVERY 5 MINUTES FOR 3 DOSES AS NEEDED FOR CHEST PAIN   potassium chloride SA (KLOR-CON M) 20 MEQ tablet TAKE 1 TABLET BY MOUTH EVERY DAY   ramipril (ALTACE) 10 MG capsule TAKE 2 CAPSULES(20 MG) BY MOUTH DAILY   REPATHA SURECLICK 101 MG/ML SOAJ INJECT 1 ML UNDER THE SKIN EVERY 14 DAYS   tamsulosin (FLOMAX) 0.4 MG CAPS capsule TAKE 1 CAPSULE(0.4 MG) BY MOUTH DAILY   XARELTO 20 MG TABS tablet TAKE 1 TABLET(20 MG) BY MOUTH DAILY WITH SUPPER   zolpidem (AMBIEN CR) 12.5 MG CR tablet TAKE 1 TABLET(12.5 MG) BY MOUTH AT BEDTIME AS NEEDED FOR SLEEP    ROS - all of the below systems have been reviewed with the patient and positives are indicated with bold text General: chills, fever or night sweats Eyes: blurry vision or double vision ENT: epistaxis or sore throat Allergy/Immunology: itchy/watery eyes or nasal congestion Hematologic/Lymphatic: bleeding problems, blood clots or swollen lymph nodes Endocrine: temperature intolerance or unexpected weight changes Breast: new or changing breast lumps or nipple discharge Resp: cough, shortness of breath, or wheezing CV: chest pain or dyspnea on exertion GI: as per HPI GU: dysuria, trouble voiding, or hematuria MSK: joint pain or joint stiffness Neuro: TIA or stroke symptoms Derm:  pruritus and skin lesion changes Psych: anxiety and depression  Objective   PE There were no vitals taken for this visit. Constitutional: NAD; conversant; no deformities Eyes: Moist conjunctiva; no lid lag; anicteric; PERRL Neck: Trachea midline; no thyromegaly Lungs: Normal respiratory effort; no tactile fremitus CV: RRR; no palpable thrills; no pitting edema GI: Abd Bilateral inguinal and umbilical hernia; no palpable hepatosplenomegaly MSK: Normal range of motion of extremities; no clubbing/cyanosis Psychiatric: Appropriate affect; alert and oriented x3 Lymphatic: No palpable cervical or axillary lymphadenopathy  No results found for this or any previous visit (from the past 24 hour(s)).  Imaging Orders  No imaging studies ordered today     Assessment and Plan   [name] Brown is an 71 y.o. male with bilateral inguinal and umbilical hernias.  I recommended laparoscopic repair.  The procedure, its risks, benefits and alternatives were discussed with the patient who  granted consent to proceed.  We will proceed as scheduled.   Felicie Morn, MD  [facility] Use [email or web address] to contact on call provider

## 2022-08-06 NOTE — Anesthesia Postprocedure Evaluation (Signed)
Anesthesia Post Note  Patient: [name]  Procedure(s) Performed: LAPAROSCOPIC BILATERAL INGUINAL HERNIA REPAIR WITH MESH AND PRIMARY CLOSURE OF UMBILICAL HERNIA (Bilateral: Abdomen)     Patient location during evaluation: PACU Anesthesia Type: General Level of consciousness: awake and alert Pain management: pain level controlled Vital Signs Assessment: post-procedure vital signs reviewed and stable Respiratory status: spontaneous breathing, nonlabored ventilation, respiratory function stable and patient connected to nasal cannula oxygen Cardiovascular status: blood pressure returned to baseline and stable Postop Assessment: no apparent nausea or vomiting Anesthetic complications: yes   Encounter Notable Events  Notable Event Outcome Phase Comment  Difficult to intubate - expected  Intraprocedure Filed from anesthesia note documentation.    Last Vitals:  Vitals:   08/06/22 1530 08/06/22 1545  BP: (!) 150/80 (!) 150/88  Pulse: 61 64  Resp: (!) 8 17  Temp: 36.7 C   SpO2: 92% 92%    Last Pain:  Vitals:   08/06/22 1545  TempSrc:   PainSc: 2                  [name] Brown

## 2022-08-06 NOTE — Anesthesia Procedure Notes (Addendum)
Procedure Name: Intubation Date/Time: 08/06/2022 1:03 PM  Performed by: Lollie Sails, CRNAPre-anesthesia Checklist: Patient identified, Emergency Drugs available, Suction available, Patient being monitored and Timeout performed Patient Re-evaluated:Patient Re-evaluated prior to induction Oxygen Delivery Method: Circle system utilized Preoxygenation: Pre-oxygenation with 100% oxygen Induction Type: IV induction Ventilation: Mask ventilation without difficulty Laryngoscope Size: Glidescope and 4 Grade View: Grade I Tube type: Oral Tube size: 7.5 mm Number of attempts: 1 Airway Equipment and Method: Rigid stylet and Video-laryngoscopy Placement Confirmation: ETT inserted through vocal cords under direct vision, positive ETCO2 and breath sounds checked- equal and bilateral Secured at: 22 cm Tube secured with: Tape Dental Injury: Teeth and Oropharynx as per pre-operative assessment  Difficulty Due To: Difficulty was anticipated and Difficult Airway- due to anterior larynx Comments: Patient has h/o glidescope intubation in past due to poor visualization of larynx with direct laryngoscopy.    Decision made to go to Glidescope with first attempt.  Full visualization of vocal cords with easy placement of endotracheal tube.

## 2022-08-07 ENCOUNTER — Encounter (HOSPITAL_COMMUNITY): Payer: Self-pay | Admitting: Surgery

## 2022-08-11 ENCOUNTER — Other Ambulatory Visit: Payer: Self-pay | Admitting: Internal Medicine

## 2022-08-12 ENCOUNTER — Telehealth: Payer: Self-pay | Admitting: *Deleted

## 2022-08-12 DIAGNOSIS — G4733 Obstructive sleep apnea (adult) (pediatric): Secondary | ICD-10-CM

## 2022-08-12 DIAGNOSIS — I10 Essential (primary) hypertension: Secondary | ICD-10-CM

## 2022-08-12 NOTE — Telephone Encounter (Signed)
-----   Message from Bernestine Amass, RN sent at 08/12/2022 11:48 AM EDT ----- Patient cancelled his Inspire Titration on 8/23. Can you get this rescheduled? Thanks! Carly

## 2022-09-05 ENCOUNTER — Other Ambulatory Visit: Payer: Self-pay | Admitting: Cardiovascular Disease

## 2022-09-05 DIAGNOSIS — I25729 Atherosclerosis of autologous artery coronary artery bypass graft(s) with unspecified angina pectoris: Secondary | ICD-10-CM

## 2022-09-05 DIAGNOSIS — E785 Hyperlipidemia, unspecified: Secondary | ICD-10-CM

## 2022-09-13 ENCOUNTER — Other Ambulatory Visit: Payer: Self-pay | Admitting: Internal Medicine

## 2022-09-22 ENCOUNTER — Ambulatory Visit (HOSPITAL_BASED_OUTPATIENT_CLINIC_OR_DEPARTMENT_OTHER): Payer: PPO | Attending: Cardiology | Admitting: Cardiology

## 2022-09-28 ENCOUNTER — Encounter: Payer: Self-pay | Admitting: *Deleted

## 2022-10-14 ENCOUNTER — Ambulatory Visit: Payer: PPO | Attending: Cardiology | Admitting: Cardiology

## 2022-10-14 ENCOUNTER — Encounter: Payer: Self-pay | Admitting: Cardiology

## 2022-10-14 VITALS — BP 128/76 | HR 60 | Ht 70.0 in | Wt 195.0 lb

## 2022-10-14 DIAGNOSIS — I4819 Other persistent atrial fibrillation: Secondary | ICD-10-CM

## 2022-10-14 DIAGNOSIS — D6869 Other thrombophilia: Secondary | ICD-10-CM

## 2022-10-14 DIAGNOSIS — I2510 Atherosclerotic heart disease of native coronary artery without angina pectoris: Secondary | ICD-10-CM

## 2022-10-14 DIAGNOSIS — Z01812 Encounter for preprocedural laboratory examination: Secondary | ICD-10-CM | POA: Diagnosis not present

## 2022-10-14 LAB — CBC

## 2022-10-14 NOTE — H&P (View-Only) (Signed)
Electrophysiology Office Note   Date:  10/14/2022   ID:  PHENIX VANDERMEULEN, DOB January 17, 1951, MRN [id number]  PCP:  Hoyt Koch, MD  Cardiologist:  Johnsie Cancel Primary Electrophysiologist:  Kelie Gainey Meredith Leeds, MD    No chief complaint on file.     History of Present Illness: PERCIVAL Brown is a 72 y.o. male who is being seen today for the evaluation of atrial fibrillation at the request of Hoyt Koch, *. Presenting today for electrophysiology evaluation.    He has a history significant for coronary artery disease post three-vessel CABG, hypertension, hyperlipidemia, atrial fibrillation.  He is post atrial fibrillation ablation 03/19/2020.  He has had more frequent episodes of atrial fibrillation scheduled for repeat ablation 10/25/2022.  Today, denies symptoms of palpitations, chest pain, shortness of breath, orthopnea, PND, lower extremity edema, claudication, dizziness, presyncope, syncope, bleeding, or neurologic sequela. The patient is tolerating medications without difficulties.  He previously was scheduled for ablation, but required urgent hernia surgery.  Since his surgery he has done well.  He is able to exert himself without issue.  He has mild fatigue.  Past Medical History:  Diagnosis Date   Aortic aneurysm [facility] Endograf 2009   Arthritis    Atrial fibrillation (HCC)    Bradycardia    CAD (coronary artery disease) CABG 03/2000   Median sternotomy for coronary artery bypass grafting x 3 (left   HTN (hypertension)    Hx of CABG 2001   severe 3 vessel disease   Hx of colonic polyp 06/2010   Hyperplastic    Hyperlipidemia    Myocardial infarction [facility])    Sleep apnea    Past Surgical History:  Procedure Laterality Date   ATRIAL FIBRILLATION ABLATION N/A 03/19/2020   Procedure: ATRIAL FIBRILLATION ABLATION;  Surgeon: Constance Haw, MD;  Location: [place] CV LAB;  Service: Cardiovascular;  Laterality: N/A;   CARDIOVERSION N/A 11/28/2018   Procedure:  CARDIOVERSION;  Surgeon: Elouise Munroe, MD;  Location: [facility] ENDOSCOPY;  Service: Cardiovascular;  Laterality: N/A;   CARDIOVERSION N/A 01/13/2021   Procedure: CARDIOVERSION;  Surgeon: Pixie Casino, MD;  Location: [facility] ENDOSCOPY;  Service: Cardiovascular;  Laterality: N/A;   COLONOSCOPY     CORONARY ARTERY BYPASS GRAFT     AAA stent graft on 12/01/2007 at [facility]  coronary bypass in 2001. Dr. Roxy Manns.   DRUG INDUCED ENDOSCOPY Right 02/18/2020   Procedure: DRUG INDUCED ENDOSCOPY;  Surgeon: Melida Quitter, MD;  Location: [place];  Service: ENT;  Laterality: Right;   FOOT SURGERY Left 07/19/2017   IMPLANTATION OF HYPOGLOSSAL NERVE STIMULATOR Right 07/30/2020   Procedure: IMPLANTATION OF HYPOGLOSSAL NERVE STIMULATOR Implantation of Chest wall respirator Sensor Electrode Electronic Analysis of Implanted Neurostimulator Pulse Generator;  Surgeon: Melida Quitter, MD;  Location: [place];  Service: ENT;  Laterality: Right;   INGUINAL HERNIA REPAIR Bilateral 08/06/2022   Procedure: LAPAROSCOPIC BILATERAL INGUINAL HERNIA REPAIR WITH MESH AND PRIMARY CLOSURE OF UMBILICAL HERNIA;  Surgeon: Felicie Morn, MD;  Location: [facility];  Service: General;  Laterality: Bilateral;   LEFT HEART CATHETERIZATION WITH CORONARY ANGIOGRAM N/A 10/26/2013   Procedure: LEFT HEART CATHETERIZATION WITH CORONARY ANGIOGRAM;  Surgeon: Peter M Martinique, MD;  Location: [facility] CATH LAB;  Service: Cardiovascular;  Laterality: N/A;   MASS EXCISION Right 08/10/2019   Procedure: EXCISION OF CYST FROM RIGHT SCALP;  Surgeon: Jovita Kussmaul, MD;  Location: [place];  Service: General;  Laterality: Right;  Current Outpatient Medications  Medication Sig Dispense Refill   acetaminophen (TYLENOL) 500 MG tablet Take 1,000 mg by mouth every 4 (four) hours as needed for moderate pain.     albuterol (VENTOLIN HFA) 108 (90 Base) MCG/ACT inhaler INHALE 2 PUFFS INTO THE LUNGS  EVERY 6 HOURS AS NEEDED FOR WHEEZING OR SHORTNESS OF BREATH 54 g 1   aspirin EC 81 MG EC tablet Take 1 tablet (81 mg total) by mouth daily.     Biotin 10000 MCG TABS Take 10,000 mcg by mouth in the morning.     cholecalciferol (VITAMIN D) 25 MCG (1000 UT) tablet Take 1,000 Units by mouth every evening.     cyclobenzaprine (FLEXERIL) 5 MG tablet Take 1 tablet (5 mg total) by mouth 3 (three) times daily as needed for muscle spasms. 30 tablet 1   diclofenac sodium (VOLTAREN) 1 % GEL Apply 2 g topically 4 (four) times daily. (Patient taking differently: Apply 2 g topically 4 (four) times daily as needed (pain).) 100 g 3   divalproex (DEPAKOTE ER) 500 MG 24 hr tablet TAKE 1 TABLET(500 MG) BY MOUTH DAILY 90 tablet 1   doxycycline (VIBRAMYCIN) 50 MG capsule Take 50 mg by mouth every evening.     Evolocumab (REPATHA SURECLICK) 161 MG/ML SOAJ ADMINISTER 1 ML UNDER THE SKIN EVERY 14 DAYS 6 mL 3   ezetimibe (ZETIA) 10 MG tablet Take 1 tablet (10 mg total) by mouth daily. 90 tablet 3   furosemide (LASIX) 20 MG tablet TAKE 1 TABLET(20 MG) BY MOUTH TWICE DAILY 180 tablet 3   gabapentin (NEURONTIN) 300 MG capsule TAKE 1 CAPSULE(300 MG) BY MOUTH TWICE DAILY 180 capsule 3   Glucosamine-Chondroitin (GLUCOSAMINE CHONDR COMPLEX PO) Take 500 mg by mouth 2 (two) times daily.      guaiFENesin (MUCINEX) 600 MG 12 hr tablet Take 600 mg by mouth 2 (two) times daily.     hydrALAZINE (APRESOLINE) 25 MG tablet TAKE 1 TABLET BY MOUTH EVERY MORNING AND AT BEDTIME 180 tablet 3   isosorbide mononitrate (IMDUR) 30 MG 24 hr tablet TAKE 1 TABLET BY MOUTH EVERY DAY 90 tablet 2   montelukast (SINGULAIR) 10 MG tablet TAKE 1 TABLET(10 MG) BY MOUTH AT BEDTIME 90 tablet 3   Multiple Vitamin (MULTIVITAMIN WITH MINERALS) TABS tablet Take 1 tablet by mouth every evening.     nitroGLYCERIN (NITROSTAT) 0.4 MG SL tablet DISSOLVE 1 TABLET UNDER THE TONGUE EVERY 5 MINUTES FOR 3 DOSES AS NEEDED FOR CHEST PAIN 25 tablet 4   Omega-3 Fatty Acids  (FISH OIL) 1200 MG CAPS Take 1,200 mg by mouth 2 (two) times daily.     oxyCODONE-acetaminophen (PERCOCET) 5-325 MG tablet Take 1 tablet by mouth every 4 (four) hours as needed for severe pain. 20 tablet 0   potassium chloride SA (KLOR-CON M) 20 MEQ tablet TAKE 1 TABLET BY MOUTH EVERY DAY 90 tablet 3   ramipril (ALTACE) 10 MG capsule TAKE 2 CAPSULES(20 MG) BY MOUTH DAILY 180 capsule 2   tamsulosin (FLOMAX) 0.4 MG CAPS capsule TAKE 1 CAPSULE(0.4 MG) BY MOUTH DAILY 90 capsule 3   vitamin C (ASCORBIC ACID) 500 MG tablet Take 500 mg by mouth daily.     XARELTO 20 MG TABS tablet TAKE 1 TABLET(20 MG) BY MOUTH DAILY WITH SUPPER 90 tablet 3   zolpidem (AMBIEN CR) 12.5 MG CR tablet TAKE 1 TABLET(12.5 MG) BY MOUTH AT BEDTIME AS NEEDED FOR SLEEP 30 tablet 5   No current facility-administered medications for this visit.  Allergies:   Statins   Social History:  The patient  reports that he quit smoking about 30 years ago. His smoking use included cigarettes. He has a 60.00 pack-year smoking history. He has never used smokeless tobacco. He reports current drug use. Drug: Marijuana. He reports that he does not drink alcohol.   Family History:  The patient's family history includes Heart attack in his father and mother; Heart disease in his father and mother.   ROS:  Please see the history of present illness.   Otherwise, review of systems is positive for none.   All other systems are reviewed and negative.   PHYSICAL EXAM: VS:  BP 128/76   Pulse 60   Ht '5\' 10"'$  (1.778 m)   Wt 195 lb (88.5 kg)   SpO2 98%   BMI 27.98 kg/m  , BMI Body mass index is 27.98 kg/m. GEN: Well nourished, well developed, in no acute distress  HEENT: normal  Neck: no JVD, carotid bruits, or masses Cardiac: irregular; no murmurs, rubs, or gallops,no edema  Respiratory:  clear to auscultation bilaterally, normal work of breathing GI: soft, nontender, nondistended, + BS MS: no deformity or atrophy  Skin: warm and dry Neuro:   Strength and sensation are intact Psych: euthymic mood, full affect  EKG:  EKG is ordered today. Personal review of the ekg ordered shows atrial fibrillation, rate 60  Recent Labs: 07/20/2022: BUN 26; Creatinine, Ser 1.16; Hemoglobin 13.8; Platelets 202; Potassium 4.1; Sodium 136    Lipid Panel     Component Value Date/Time   CHOL 109 02/04/2021 0747   TRIG 78 02/04/2021 0747   HDL 34 (L) 02/04/2021 0747   CHOLHDL 3.2 02/04/2021 0747   CHOLHDL 4.8 11/29/2018 0223   VLDL 6 11/29/2018 0223   LDLCALC 59 02/04/2021 0747     Wt Readings from Last 3 Encounters:  10/14/22 195 lb (88.5 kg)  08/06/22 185 lb (83.9 kg)  07/20/22 185 lb 3.2 oz (84 kg)      Other studies Reviewed: Additional studies/ records that were reviewed today include: TTE 01/21/20 Review of the above records today demonstrates:   1. Left ventricular ejection fraction, by estimation, is 55 to 60%. The  left ventricle has normal function. Left ventricular endocardial border  not optimally defined to evaluate regional wall motion. There is mild left  ventricular hypertrophy. Left  ventricular diastolic parameters are consistent with Grade II diastolic  dysfunction (pseudonormalization).   2. Right ventricular systolic function is normal. The right ventricular  size is mildly enlarged. There is mildly elevated pulmonary artery  systolic pressure.   3. Left atrial size was moderately dilated.   4. Right atrial size was mildly dilated.   5. The mitral valve is normal in structure. Mild mitral valve  regurgitation.   6. The aortic valve is tricuspid. Aortic valve regurgitation is mild.  Mild aortic valve sclerosis is present, with no evidence of aortic valve  stenosis.   7. The inferior vena cava is normal in size with greater than 50%  respiratory variability, suggesting right atrial pressure of 3 mmHg.   SPECT 11/08/18 The left ventricular ejection fraction is normal (55-65%). Nuclear stress EF: 56%. Post  CABG septal wall hypokinesis. There was no ST segment deviation noted during stress. There is mildly reduced radiotracer uptake at both rest and stress in the anterior, lateral and inferolateral distributions. No ischemia identified. (POST CABG) This is a low risk study.  ASSESSMENT AND PLAN:  1.  Persistent atrial  fibrillation: Currently on Eliquis 5 mg twice daily.  CHA2DS2-VASc of 3.  Status post ablation 03/19/2020.  Has had more frequent episodes of atrial fibrillation and plan for repeat ablation 10/25/2022.  Risk and benefits have been discussed.  He understands the risks and is agreed to the procedure.  2.  Coronary artery disease: Status post CABG.  Occluded radial to the OM.  No current chest pain.  3.  Hypertension: well controlled  4.  Hyperlipidemia: Continue Repatha per lipid clinic  5.  Obstructive sleep apnea: Treated with neurostimulator.  Undergoing therapy with sleep medicine.  6.  Secondary hypercoagulable state: Currently on Eliquis for atrial fibrillation as above   Current medicines are reviewed at length with the patient today.   The patient does not have concerns regarding his medicines.  The following changes were made today: none  Labs/ tests ordered today include:  Orders Placed This Encounter  Procedures   Basic metabolic panel   CBC   EKG 12-Lead     Disposition:   FU 3 months  Signed, Amran Malter Meredith Leeds, MD  10/14/2022 9:50 AM     [facility] [address] [place] [address] [phone] (office) [phone] (fax)

## 2022-10-14 NOTE — Patient Instructions (Signed)
Medication Instructions:  Your physician recommends that you continue on your current medications as directed. Please refer to the Current Medication list given to you today.  *If you need a refill on your cardiac medications before your next appointment, please call your pharmacy*   Lab Work: Pre procedure labs today:   BMP & CBC  If you have labs (blood work) drawn today and your tests are completely normal, you will receive your results only by: [place] (if you have MyChart) OR A paper copy in the mail If you have any lab test that is abnormal or we need to change your treatment, we will call you to review the results.   Testing/Procedures: Your physician has requested that you have cardiac CT within 7 days PRIOR to your ablation. Cardiac computed tomography (CT) is a painless test that uses an x-ray machine to take clear, detailed pictures of your heart.  Please follow instruction below located under "other instructions". You are scheduled for 10/19/2022  Your physician has recommended that you have an ablation. Catheter ablation is a medical procedure used to treat some cardiac arrhythmias (irregular heartbeats). During catheter ablation, a long, thin, flexible tube is put into a blood vessel in your groin (upper thigh), or neck. This tube is called an ablation catheter. It is then guided to your heart through the blood vessel. Radio frequency waves destroy small areas of heart tissue where abnormal heartbeats may cause an arrhythmia to start. Please follow instruction letter given to you today.   Follow-Up: At [facility], you and your health needs are our priority.  As part of our continuing mission to provide you with exceptional heart care, we have created designated Provider Care Teams.  These Care Teams include your primary Cardiologist (physician) and Advanced Practice Providers (APPs -  Physician Assistants and Nurse Practitioners) who all work together to provide you  with the care you need, when you need it.  Your next appointment:   1 month(s) after your ablation  The format for your next appointment:   In Person  Provider:   [facility] clinic   Thank you for choosing [facility]!!   Trinidad Curet, RN [phone]    Other Instructions   Cardiac Ablation Cardiac ablation is a procedure to destroy (ablate) some heart tissue that is sending bad signals. These bad signals cause problems in heart rhythm. The heart has many areas that make these signals. If there are problems in these areas, they can make the heart beat in a way that is not normal. Destroying some tissues can help make the heart rhythm normal. Tell your doctor about: Any allergies you have. All medicines you are taking. These include vitamins, herbs, eye drops, creams, and over-the-counter medicines. Any problems you or family members have had with medicines that make you fall asleep (anesthetics). Any blood disorders you have. Any surgeries you have had. Any medical conditions you have, such as kidney failure. Whether you are pregnant or may be pregnant. What are the risks? This is a safe procedure. But problems may occur, including: Infection. Bruising and bleeding. Bleeding into the chest. Stroke or blood clots. Damage to nearby areas of your body. Allergies to medicines or dyes. The need for a pacemaker if the normal system is damaged. Failure of the procedure to treat the problem. What happens before the procedure? Medicines Ask your doctor about: Changing or stopping your normal medicines. This is important. Taking aspirin and ibuprofen. Do not take these medicines unless your  doctor tells you to take them. Taking other medicines, vitamins, herbs, and supplements. General instructions Follow instructions from your doctor about what you cannot eat or drink. Plan to have someone take you home from the hospital or clinic. If you will be going home right after the  procedure, plan to have someone with you for 24 hours. Ask your doctor what steps will be taken to prevent infection. What happens during the procedure?  An IV tube will be put into one of your veins. You will be given a medicine to help you relax. The skin on your neck or groin will be numbed. A cut (incision) will be made in your neck or groin. A needle will be put through your cut and into a large vein. A tube (catheter) will be put into the needle. The tube will be moved to your heart. Dye may be put through the tube. This helps your doctor see your heart. Small devices (electrodes) on the tube will send out signals. A type of energy will be used to destroy some heart tissue. The tube will be taken out. Pressure will be held on your cut. This helps stop bleeding. A bandage will be put over your cut. The exact procedure may vary among doctors and hospitals. What happens after the procedure? You will be watched until you leave the hospital or clinic. This includes checking your heart rate, breathing rate, oxygen, and blood pressure. Your cut will be watched for bleeding. You will need to lie still for a few hours. Do not drive for 24 hours or as long as your doctor tells you. Summary Cardiac ablation is a procedure to destroy some heart tissue. This is done to treat heart rhythm problems. Tell your doctor about any medical conditions you may have. Tell him or her about all medicines you are taking to treat them. This is a safe procedure. But problems may occur. These include infection, bruising, bleeding, and damage to nearby areas of your body. Follow what your doctor tells you about food and drink. You may also be told to change or stop some of your medicines. After the procedure, do not drive for 24 hours or as long as your doctor tells you. This information is not intended to replace advice given to you by your health care provider. Make sure you discuss any questions you have with  your health care provider. Document Revised: 12/18/2021 Document Reviewed: 08/30/2019 Elsevier Patient Education  [employer].

## 2022-10-14 NOTE — Progress Notes (Signed)
Electrophysiology Office Note   Date:  10/14/2022   ID:  [name] Brown, DOB 1950/10/15, MRN [id number]  PCP:  Hoyt Koch, MD  Cardiologist:  Johnsie Cancel Primary Electrophysiologist:  Stanton Kissoon Meredith Leeds, MD    No chief complaint on file.     History of Present Illness: [name] Brown is a 72 y.o. male who is being seen today for the evaluation of atrial fibrillation at the request of Hoyt Koch, *. Presenting today for electrophysiology evaluation.    He has a history significant for coronary artery disease post three-vessel CABG, hypertension, hyperlipidemia, atrial fibrillation.  He is post atrial fibrillation ablation 03/19/2020.  He has had more frequent episodes of atrial fibrillation scheduled for repeat ablation 10/25/2022.  Today, denies symptoms of palpitations, chest pain, shortness of breath, orthopnea, PND, lower extremity edema, claudication, dizziness, presyncope, syncope, bleeding, or neurologic sequela. The patient is tolerating medications without difficulties.  He previously was scheduled for ablation, but required urgent hernia surgery.  Since his surgery he has done well.  He is able to exert himself without issue.  He has mild fatigue.  Past Medical History:  Diagnosis Date   Aortic aneurysm [facility]) Endograf 2009   Arthritis    Atrial fibrillation (HCC)    Bradycardia    CAD (coronary artery disease) CABG 03/2000   Median sternotomy for coronary artery bypass grafting x 3 (left   HTN (hypertension)    Hx of CABG 2001   severe 3 vessel disease   Hx of colonic polyp 06/2010   Hyperplastic    Hyperlipidemia    Myocardial infarction [facility])    Sleep apnea    Past Surgical History:  Procedure Laterality Date   ATRIAL FIBRILLATION ABLATION N/A 03/19/2020   Procedure: ATRIAL FIBRILLATION ABLATION;  Surgeon: Constance Haw, MD;  Location: [facility] CV LAB;  Service: Cardiovascular;  Laterality: N/A;   CARDIOVERSION N/A 11/28/2018   Procedure:  CARDIOVERSION;  Surgeon: Elouise Munroe, MD;  Location: [facility] ENDOSCOPY;  Service: Cardiovascular;  Laterality: N/A;   CARDIOVERSION N/A 01/13/2021   Procedure: CARDIOVERSION;  Surgeon: Pixie Casino, MD;  Location: [facility] ENDOSCOPY;  Service: Cardiovascular;  Laterality: N/A;   COLONOSCOPY     CORONARY ARTERY BYPASS GRAFT     AAA stent graft on 12/01/2007 at [facility]  coronary bypass in 2001. Dr. Roxy Manns.   DRUG INDUCED ENDOSCOPY Right 02/18/2020   Procedure: DRUG INDUCED ENDOSCOPY;  Surgeon: Melida Quitter, MD;  Location: [place];  Service: ENT;  Laterality: Right;   FOOT SURGERY Left 07/19/2017   IMPLANTATION OF HYPOGLOSSAL NERVE STIMULATOR Right 07/30/2020   Procedure: IMPLANTATION OF HYPOGLOSSAL NERVE STIMULATOR Implantation of Chest wall respirator Sensor Electrode Electronic Analysis of Implanted Neurostimulator Pulse Generator;  Surgeon: Melida Quitter, MD;  Location: [place];  Service: ENT;  Laterality: Right;   INGUINAL HERNIA REPAIR Bilateral 08/06/2022   Procedure: LAPAROSCOPIC BILATERAL INGUINAL HERNIA REPAIR WITH MESH AND PRIMARY CLOSURE OF UMBILICAL HERNIA;  Surgeon: Felicie Morn, MD;  Location: [facility] ORS;  Service: General;  Laterality: Bilateral;   LEFT HEART CATHETERIZATION WITH CORONARY ANGIOGRAM N/A 10/26/2013   Procedure: LEFT HEART CATHETERIZATION WITH CORONARY ANGIOGRAM;  Surgeon: Peter M Martinique, MD;  Location: [facility] CATH LAB;  Service: Cardiovascular;  Laterality: N/A;   MASS EXCISION Right 08/10/2019   Procedure: EXCISION OF CYST FROM RIGHT SCALP;  Surgeon: Jovita Kussmaul, MD;  Location: [place];  Service: General;  Laterality: Right;  Current Outpatient Medications  Medication Sig Dispense Refill   acetaminophen (TYLENOL) 500 MG tablet Take 1,000 mg by mouth every 4 (four) hours as needed for moderate pain.     albuterol (VENTOLIN HFA) 108 (90 Base) MCG/ACT inhaler INHALE 2 PUFFS INTO THE LUNGS  EVERY 6 HOURS AS NEEDED FOR WHEEZING OR SHORTNESS OF BREATH 54 g 1   aspirin EC 81 MG EC tablet Take 1 tablet (81 mg total) by mouth daily.     Biotin 10000 MCG TABS Take 10,000 mcg by mouth in the morning.     cholecalciferol (VITAMIN D) 25 MCG (1000 UT) tablet Take 1,000 Units by mouth every evening.     cyclobenzaprine (FLEXERIL) 5 MG tablet Take 1 tablet (5 mg total) by mouth 3 (three) times daily as needed for muscle spasms. 30 tablet 1   diclofenac sodium (VOLTAREN) 1 % GEL Apply 2 g topically 4 (four) times daily. (Patient taking differently: Apply 2 g topically 4 (four) times daily as needed (pain).) 100 g 3   divalproex (DEPAKOTE ER) 500 MG 24 hr tablet TAKE 1 TABLET(500 MG) BY MOUTH DAILY 90 tablet 1   doxycycline (VIBRAMYCIN) 50 MG capsule Take 50 mg by mouth every evening.     Evolocumab (REPATHA SURECLICK) 130 MG/ML SOAJ ADMINISTER 1 ML UNDER THE SKIN EVERY 14 DAYS 6 mL 3   ezetimibe (ZETIA) 10 MG tablet Take 1 tablet (10 mg total) by mouth daily. 90 tablet 3   furosemide (LASIX) 20 MG tablet TAKE 1 TABLET(20 MG) BY MOUTH TWICE DAILY 180 tablet 3   gabapentin (NEURONTIN) 300 MG capsule TAKE 1 CAPSULE(300 MG) BY MOUTH TWICE DAILY 180 capsule 3   Glucosamine-Chondroitin (GLUCOSAMINE CHONDR COMPLEX PO) Take 500 mg by mouth 2 (two) times daily.      guaiFENesin (MUCINEX) 600 MG 12 hr tablet Take 600 mg by mouth 2 (two) times daily.     hydrALAZINE (APRESOLINE) 25 MG tablet TAKE 1 TABLET BY MOUTH EVERY MORNING AND AT BEDTIME 180 tablet 3   isosorbide mononitrate (IMDUR) 30 MG 24 hr tablet TAKE 1 TABLET BY MOUTH EVERY DAY 90 tablet 2   montelukast (SINGULAIR) 10 MG tablet TAKE 1 TABLET(10 MG) BY MOUTH AT BEDTIME 90 tablet 3   Multiple Vitamin (MULTIVITAMIN WITH MINERALS) TABS tablet Take 1 tablet by mouth every evening.     nitroGLYCERIN (NITROSTAT) 0.4 MG SL tablet DISSOLVE 1 TABLET UNDER THE TONGUE EVERY 5 MINUTES FOR 3 DOSES AS NEEDED FOR CHEST PAIN 25 tablet 4   Omega-3 Fatty Acids  (FISH OIL) 1200 MG CAPS Take 1,200 mg by mouth 2 (two) times daily.     oxyCODONE-acetaminophen (PERCOCET) 5-325 MG tablet Take 1 tablet by mouth every 4 (four) hours as needed for severe pain. 20 tablet 0   potassium chloride SA (KLOR-CON M) 20 MEQ tablet TAKE 1 TABLET BY MOUTH EVERY DAY 90 tablet 3   ramipril (ALTACE) 10 MG capsule TAKE 2 CAPSULES(20 MG) BY MOUTH DAILY 180 capsule 2   tamsulosin (FLOMAX) 0.4 MG CAPS capsule TAKE 1 CAPSULE(0.4 MG) BY MOUTH DAILY 90 capsule 3   vitamin C (ASCORBIC ACID) 500 MG tablet Take 500 mg by mouth daily.     XARELTO 20 MG TABS tablet TAKE 1 TABLET(20 MG) BY MOUTH DAILY WITH SUPPER 90 tablet 3   zolpidem (AMBIEN CR) 12.5 MG CR tablet TAKE 1 TABLET(12.5 MG) BY MOUTH AT BEDTIME AS NEEDED FOR SLEEP 30 tablet 5   No current facility-administered medications for this visit.  Allergies:   Statins   Social History:  The patient  reports that he quit smoking about 30 years ago. His smoking use included cigarettes. He has a 60.00 pack-year smoking history. He has never used smokeless tobacco. He reports current drug use. Drug: Marijuana. He reports that he does not drink alcohol.   Family History:  The patient's family history includes Heart attack in his father and mother; Heart disease in his father and mother.   ROS:  Please see the history of present illness.   Otherwise, review of systems is positive for none.   All other systems are reviewed and negative.   PHYSICAL EXAM: VS:  BP 128/76   Pulse 60   Ht '5\' 10"'$  (1.778 m)   Wt 195 lb (88.5 kg)   SpO2 98%   BMI 27.98 kg/m  , BMI Body mass index is 27.98 kg/m. GEN: Well nourished, well developed, in no acute distress  HEENT: normal  Neck: no JVD, carotid bruits, or masses Cardiac: irregular; no murmurs, rubs, or gallops,no edema  Respiratory:  clear to auscultation bilaterally, normal work of breathing GI: soft, nontender, nondistended, + BS MS: no deformity or atrophy  Skin: warm and dry Neuro:   Strength and sensation are intact Psych: euthymic mood, full affect  EKG:  EKG is ordered today. Personal review of the ekg ordered shows atrial fibrillation, rate 60  Recent Labs: 07/20/2022: BUN 26; Creatinine, Ser 1.16; Hemoglobin 13.8; Platelets 202; Potassium 4.1; Sodium 136    Lipid Panel     Component Value Date/Time   CHOL 109 02/04/2021 0747   TRIG 78 02/04/2021 0747   HDL 34 (L) 02/04/2021 0747   CHOLHDL 3.2 02/04/2021 0747   CHOLHDL 4.8 11/29/2018 0223   VLDL 6 11/29/2018 0223   LDLCALC 59 02/04/2021 0747     Wt Readings from Last 3 Encounters:  10/14/22 195 lb (88.5 kg)  08/06/22 185 lb (83.9 kg)  07/20/22 185 lb 3.2 oz (84 kg)      Other studies Reviewed: Additional studies/ records that were reviewed today include: TTE 01/21/20 Review of the above records today demonstrates:   1. Left ventricular ejection fraction, by estimation, is 55 to 60%. The  left ventricle has normal function. Left ventricular endocardial border  not optimally defined to evaluate regional wall motion. There is mild left  ventricular hypertrophy. Left  ventricular diastolic parameters are consistent with Grade II diastolic  dysfunction (pseudonormalization).   2. Right ventricular systolic function is normal. The right ventricular  size is mildly enlarged. There is mildly elevated pulmonary artery  systolic pressure.   3. Left atrial size was moderately dilated.   4. Right atrial size was mildly dilated.   5. The mitral valve is normal in structure. Mild mitral valve  regurgitation.   6. The aortic valve is tricuspid. Aortic valve regurgitation is mild.  Mild aortic valve sclerosis is present, with no evidence of aortic valve  stenosis.   7. The inferior vena cava is normal in size with greater than 50%  respiratory variability, suggesting right atrial pressure of 3 mmHg.   SPECT 11/08/18 The left ventricular ejection fraction is normal (55-65%). Nuclear stress EF: 56%. Post  CABG septal wall hypokinesis. There was no ST segment deviation noted during stress. There is mildly reduced radiotracer uptake at both rest and stress in the anterior, lateral and inferolateral distributions. No ischemia identified. (POST CABG) This is a low risk study.  ASSESSMENT AND PLAN:  1.  Persistent atrial  fibrillation: Currently on Eliquis 5 mg twice daily.  CHA2DS2-VASc of 3.  Status post ablation 03/19/2020.  Has had more frequent episodes of atrial fibrillation and plan for repeat ablation 10/25/2022.  Risk and benefits have been discussed.  He understands the risks and is agreed to the procedure.  2.  Coronary artery disease: Status post CABG.  Occluded radial to the OM.  No current chest pain.  3.  Hypertension: well controlled  4.  Hyperlipidemia: Continue Repatha per lipid clinic  5.  Obstructive sleep apnea: Treated with neurostimulator.  Undergoing therapy with sleep medicine.  6.  Secondary hypercoagulable state: Currently on Eliquis for atrial fibrillation as above   Current medicines are reviewed at length with the patient today.   The patient does not have concerns regarding his medicines.  The following changes were made today: none  Labs/ tests ordered today include:  Orders Placed This Encounter  Procedures   Basic metabolic panel   CBC   EKG 12-Lead     Disposition:   FU 3 months  Signed, Deseri Loss Meredith Leeds, MD  10/14/2022 9:50 AM     [facility] [address] [place] [address] [phone] (office) [phone] (fax)

## 2022-10-15 LAB — BASIC METABOLIC PANEL
BUN/Creatinine Ratio: 18 (ref 10–24)
BUN: 19 mg/dL (ref 8–27)
CO2: 25 mmol/L (ref 20–29)
Calcium: 9.9 mg/dL (ref 8.6–10.2)
Chloride: 102 mmol/L (ref 96–106)
Creatinine, Ser: 1.06 mg/dL (ref 0.76–1.27)
Glucose: 89 mg/dL (ref 70–99)
Potassium: 4.8 mmol/L (ref 3.5–5.2)
Sodium: 141 mmol/L (ref 134–144)
eGFR: 75 mL/min/{1.73_m2} (ref >59)

## 2022-10-15 LAB — CBC
Hematocrit: 41.9 % (ref 37.5–51.0)
Hemoglobin: 14.4 g/dL (ref 13.0–17.7)
MCH: 33.8 pg — ABNORMAL HIGH (ref 26.6–33.0)
MCHC: 34.4 g/dL (ref 31.5–35.7)
MCV: 98 fL — ABNORMAL HIGH (ref 79–97)
Platelets: 225 10*3/uL (ref 150–450)
RBC: 4.26 x10E6/uL (ref 4.14–5.80)
RDW: 14 % (ref 11.6–15.4)
WBC: 6.2 10*3/uL (ref 3.4–10.8)

## 2022-10-18 ENCOUNTER — Telehealth (HOSPITAL_COMMUNITY): Payer: Self-pay | Admitting: Emergency Medicine

## 2022-10-18 NOTE — Telephone Encounter (Signed)
Reaching out to patient to offer assistance regarding upcoming cardiac imaging study; pt verbalizes understanding of appt date/time, parking situation and where to check in, pre-test NPO status and medications ordered, and verified current allergies; name and call back number provided for further questions should they arise Marchia Bond RN Navigator Cardiac Imaging [facility] [phone] office [phone] cell  Arrival [address] Denies iv issues Daily meds except lasix No caffeine

## 2022-10-19 ENCOUNTER — Encounter (HOSPITAL_BASED_OUTPATIENT_CLINIC_OR_DEPARTMENT_OTHER): Payer: Self-pay

## 2022-10-19 ENCOUNTER — Ambulatory Visit (HOSPITAL_BASED_OUTPATIENT_CLINIC_OR_DEPARTMENT_OTHER)
Admission: RE | Admit: 2022-10-19 | Discharge: 2022-10-19 | Disposition: A | Discharge status: Home / Self Care | Payer: PPO | Source: Ambulatory Visit | Attending: Cardiology | Admitting: Cardiology

## 2022-10-19 DIAGNOSIS — I4819 Other persistent atrial fibrillation: Secondary | ICD-10-CM | POA: Diagnosis not present

## 2022-10-19 MED ORDER — IOHEXOL 350 MG/ML SOLN
100.0000 mL | Freq: Once | INTRAVENOUS | Status: AC | PRN
  Administered 2022-10-19: 80 mL via INTRAVENOUS

## 2022-10-22 NOTE — Pre-Procedure Instructions (Signed)
Attempted to call patient regarding procedure instructions.  Left voice mail on the following items: Arrival time 0515 Nothing to eat or drink after midnight No meds AM of procedure Responsible person to drive you home and stay with you for 24 hrs  Have you missed any doses of anti-coagulant Xarelto- if you have missed any doses please let the office know right away.

## 2022-10-25 ENCOUNTER — Ambulatory Visit (HOSPITAL_COMMUNITY): Payer: PPO | Admitting: Anesthesiology

## 2022-10-25 ENCOUNTER — Ambulatory Visit (HOSPITAL_BASED_OUTPATIENT_CLINIC_OR_DEPARTMENT_OTHER): Payer: PPO | Admitting: Anesthesiology

## 2022-10-25 ENCOUNTER — Encounter (HOSPITAL_COMMUNITY)
Admission: RE | Disposition: A | Discharge status: Home / Self Care | Payer: Self-pay | Source: Home / Self Care | Attending: Cardiology

## 2022-10-25 ENCOUNTER — Ambulatory Visit (HOSPITAL_COMMUNITY)
Admission: RE | Admit: 2022-10-25 | Discharge: 2022-10-25 | Disposition: A | Discharge status: Home / Self Care | Payer: PPO | Attending: Cardiology | Admitting: Cardiology

## 2022-10-25 ENCOUNTER — Other Ambulatory Visit: Payer: Self-pay

## 2022-10-25 DIAGNOSIS — Z951 Presence of aortocoronary bypass graft: Secondary | ICD-10-CM | POA: Diagnosis not present

## 2022-10-25 DIAGNOSIS — I4891 Unspecified atrial fibrillation: Secondary | ICD-10-CM | POA: Diagnosis not present

## 2022-10-25 DIAGNOSIS — F1290 Cannabis use, unspecified, uncomplicated: Secondary | ICD-10-CM | POA: Diagnosis not present

## 2022-10-25 DIAGNOSIS — I739 Peripheral vascular disease, unspecified: Secondary | ICD-10-CM | POA: Diagnosis not present

## 2022-10-25 DIAGNOSIS — G4733 Obstructive sleep apnea (adult) (pediatric): Secondary | ICD-10-CM | POA: Diagnosis not present

## 2022-10-25 DIAGNOSIS — Z7901 Long term (current) use of anticoagulants: Secondary | ICD-10-CM | POA: Insufficient documentation

## 2022-10-25 DIAGNOSIS — Z8249 Family history of ischemic heart disease and other diseases of the circulatory system: Secondary | ICD-10-CM | POA: Diagnosis not present

## 2022-10-25 DIAGNOSIS — I10 Essential (primary) hypertension: Secondary | ICD-10-CM | POA: Insufficient documentation

## 2022-10-25 DIAGNOSIS — E785 Hyperlipidemia, unspecified: Secondary | ICD-10-CM | POA: Insufficient documentation

## 2022-10-25 DIAGNOSIS — I4819 Other persistent atrial fibrillation: Secondary | ICD-10-CM | POA: Insufficient documentation

## 2022-10-25 DIAGNOSIS — D6869 Other thrombophilia: Secondary | ICD-10-CM | POA: Diagnosis not present

## 2022-10-25 DIAGNOSIS — Z87891 Personal history of nicotine dependence: Secondary | ICD-10-CM

## 2022-10-25 DIAGNOSIS — I252 Old myocardial infarction: Secondary | ICD-10-CM | POA: Diagnosis not present

## 2022-10-25 DIAGNOSIS — G4730 Sleep apnea, unspecified: Secondary | ICD-10-CM | POA: Diagnosis not present

## 2022-10-25 DIAGNOSIS — I2510 Atherosclerotic heart disease of native coronary artery without angina pectoris: Secondary | ICD-10-CM

## 2022-10-25 DIAGNOSIS — I480 Paroxysmal atrial fibrillation: Secondary | ICD-10-CM | POA: Diagnosis not present

## 2022-10-25 HISTORY — PX: ATRIAL FIBRILLATION ABLATION: EP1191

## 2022-10-25 LAB — POCT ACTIVATED CLOTTING TIME: Activated Clotting Time: 336 seconds

## 2022-10-25 SURGERY — ATRIAL FIBRILLATION ABLATION
Anesthesia: General

## 2022-10-25 MED ORDER — DOBUTAMINE INFUSION FOR EP/ECHO/NUC (1000 MCG/ML)
INTRAVENOUS | Status: DC | PRN
  Administered 2022-10-25: 20 ug/kg/min via INTRAVENOUS

## 2022-10-25 MED ORDER — SODIUM CHLORIDE 0.9 % IV SOLN: 250.0000 mL | INTRAVENOUS | Status: DC | PRN

## 2022-10-25 MED ORDER — HEPARIN (PORCINE) IN NACL 1000-0.9 UT/500ML-% IV SOLN
INTRAVENOUS | Status: AC
  Filled 2022-10-25: qty 500

## 2022-10-25 MED ORDER — HEPARIN SODIUM (PORCINE) 1000 UNIT/ML IJ SOLN
INTRAMUSCULAR | Status: DC | PRN
  Administered 2022-10-25: 2000 [IU] via INTRAVENOUS
  Administered 2022-10-25: 14000 [IU] via INTRAVENOUS

## 2022-10-25 MED ORDER — PHENYLEPHRINE HCL-NACL 20-0.9 MG/250ML-% IV SOLN
INTRAVENOUS | Status: DC | PRN
  Administered 2022-10-25: 40 ug/min via INTRAVENOUS

## 2022-10-25 MED ORDER — SODIUM CHLORIDE 0.9% FLUSH: 3.0000 mL | INTRAVENOUS | Status: DC | PRN

## 2022-10-25 MED ORDER — ROCURONIUM BROMIDE 10 MG/ML (PF) SYRINGE
PREFILLED_SYRINGE | INTRAVENOUS | Status: DC | PRN
  Administered 2022-10-25: 80 mg via INTRAVENOUS

## 2022-10-25 MED ORDER — PROTAMINE SULFATE 10 MG/ML IV SOLN
INTRAVENOUS | Status: DC | PRN
  Administered 2022-10-25: 40 mg via INTRAVENOUS

## 2022-10-25 MED ORDER — SODIUM CHLORIDE 0.9% FLUSH: 3.0000 mL | Freq: Two times a day (BID) | INTRAVENOUS | Status: DC

## 2022-10-25 MED ORDER — HEPARIN SODIUM (PORCINE) 1000 UNIT/ML IJ SOLN
INTRAMUSCULAR | Status: AC
  Filled 2022-10-25: qty 10

## 2022-10-25 MED ORDER — PHENYLEPHRINE 80 MCG/ML (10ML) SYRINGE FOR IV PUSH (FOR BLOOD PRESSURE SUPPORT)
PREFILLED_SYRINGE | INTRAVENOUS | Status: DC | PRN
  Administered 2022-10-25: 160 ug via INTRAVENOUS
  Administered 2022-10-25 (×3): 80 ug via INTRAVENOUS

## 2022-10-25 MED ORDER — SUGAMMADEX SODIUM 200 MG/2ML IV SOLN
INTRAVENOUS | Status: DC | PRN
  Administered 2022-10-25: 200 mg via INTRAVENOUS

## 2022-10-25 MED ORDER — ONDANSETRON HCL 4 MG/2ML IJ SOLN
INTRAMUSCULAR | Status: DC | PRN
  Administered 2022-10-25: 4 mg via INTRAVENOUS

## 2022-10-25 MED ORDER — PROPOFOL 10 MG/ML IV BOLUS
INTRAVENOUS | Status: DC | PRN
  Administered 2022-10-25: 20 mg via INTRAVENOUS
  Administered 2022-10-25: 80 mg via INTRAVENOUS

## 2022-10-25 MED ORDER — DOBUTAMINE INFUSION FOR EP/ECHO/NUC (1000 MCG/ML)
INTRAVENOUS | Status: AC
  Filled 2022-10-25: qty 250

## 2022-10-25 MED ORDER — FENTANYL CITRATE (PF) 250 MCG/5ML IJ SOLN
INTRAMUSCULAR | Status: DC | PRN
  Administered 2022-10-25 (×2): 25 ug via INTRAVENOUS

## 2022-10-25 MED ORDER — ONDANSETRON HCL 4 MG/2ML IJ SOLN: 4.0000 mg | Freq: Four times a day (QID) | INTRAMUSCULAR | Status: DC | PRN

## 2022-10-25 MED ORDER — ACETAMINOPHEN 325 MG PO TABS: 650.0000 mg | ORAL_TABLET | ORAL | Status: DC | PRN

## 2022-10-25 MED ORDER — HEPARIN (PORCINE) IN NACL 1000-0.9 UT/500ML-% IV SOLN
INTRAVENOUS | Status: DC | PRN
  Administered 2022-10-25 (×4): 500 mL

## 2022-10-25 MED ORDER — SODIUM CHLORIDE 0.9 % IV SOLN: INTRAVENOUS | Status: DC

## 2022-10-25 MED ORDER — DEXAMETHASONE SODIUM PHOSPHATE 10 MG/ML IJ SOLN
INTRAMUSCULAR | Status: DC | PRN
  Administered 2022-10-25: 5 mg via INTRAVENOUS

## 2022-10-25 MED ORDER — HEPARIN SODIUM (PORCINE) 1000 UNIT/ML IJ SOLN
INTRAMUSCULAR | Status: DC | PRN
  Administered 2022-10-25: 1000 [IU] via INTRAVENOUS

## 2022-10-25 SURGICAL SUPPLY — 19 items
BAG SNAP BAND KOVER 36X36 (MISCELLANEOUS) IMPLANT
CATH 8FR REPROCESSED SOUNDSTAR (CATHETERS) ×1 IMPLANT
CATH 8FR SOUNDSTAR REPROCESSED (CATHETERS) IMPLANT
CATH ABLAT QDOT MICRO BI TC DF (CATHETERS) IMPLANT
CATH OCTARAY 2.0 F 3-3-3-3-3 (CATHETERS) IMPLANT
CATH PIGTAIL STEERABLE D1 8.7 (WIRE) IMPLANT
CATH S-M CIRCA TEMP PROBE (CATHETERS) IMPLANT
CATH WEBSTER BI DIR CS D-F CRV (CATHETERS) IMPLANT
CLOSURE PERCLOSE PROSTYLE (VASCULAR PRODUCTS) IMPLANT
COVER SWIFTLINK CONNECTOR (BAG) ×1 IMPLANT
PACK EP LATEX FREE (CUSTOM PROCEDURE TRAY) ×1
PACK EP LF (CUSTOM PROCEDURE TRAY) ×1 IMPLANT
PAD DEFIB RADIO PHYSIO CONN (PAD) ×1 IMPLANT
PATCH CARTO3 (PAD) IMPLANT
SHEATH CARTO VIZIGO SM CVD (SHEATH) IMPLANT
SHEATH PINNACLE 7F 10CM (SHEATH) IMPLANT
SHEATH PINNACLE 8F 10CM (SHEATH) IMPLANT
SHEATH PINNACLE 9F 10CM (SHEATH) IMPLANT
TUBING SMART ABLATE COOLFLOW (TUBING) IMPLANT

## 2022-10-25 NOTE — Discharge Instructions (Signed)

## 2022-10-25 NOTE — Anesthesia Preprocedure Evaluation (Addendum)
Anesthesia Evaluation  Patient identified by MRN, date of birth, ID band Patient awake    Reviewed: Allergy & Precautions, NPO status , Patient's Chart, lab work & pertinent test results  Airway Mallampati: II  TM Distance: >3 FB Neck ROM: Full    Dental  (+) Loose, Dental Advisory Given, Edentulous Upper,    Pulmonary sleep apnea (s/p hypoglossal nerve stimulator, currently off) , former smoker   Pulmonary exam normal breath sounds clear to auscultation       Cardiovascular hypertension, + CAD, + Past MI, + CABG (2001) and + Peripheral Vascular Disease (aortic aneurysm s/p repair 2009)  Normal cardiovascular exam+ dysrhythmias (on xarelto) Atrial Fibrillation  Rhythm:Regular Rate:Normal  TTE 2021 1. Left ventricular ejection fraction, by estimation, is 55 to 60%. The  left ventricle has normal function. Left ventricular endocardial border  not optimally defined to evaluate regional wall motion. There is mild left  ventricular hypertrophy. Left  ventricular diastolic parameters are consistent with Grade II diastolic  dysfunction (pseudonormalization).   2. Right ventricular systolic function is normal. The right ventricular  size is mildly enlarged. There is mildly elevated pulmonary artery  systolic pressure.   3. Left atrial size was moderately dilated.   4. Right atrial size was mildly dilated.   5. The mitral valve is normal in structure. Mild mitral valve  regurgitation.   6. The aortic valve is tricuspid. Aortic valve regurgitation is mild.  Mild aortic valve sclerosis is present, with no evidence of aortic valve  stenosis.   7. The inferior vena cava is normal in size with greater than 50%  respiratory variability, suggesting right atrial pressure of 3 mmHg.   Stress Test 2020  The left ventricular ejection fraction is normal (55-65%).  Nuclear stress EF: 56%. Post CABG septal wall hypokinesis.  There was no ST  segment deviation noted during stress.  There is mildly reduced radiotracer uptake at both rest and stress in the anterior, lateral and inferolateral distributions. No ischemia identified. (POST CABG)  This is a low risk study.     Neuro/Psych  Headaches  negative psych ROS   GI/Hepatic negative GI ROS,,,(+)     substance abuse  alcohol use  Endo/Other  negative endocrine ROS    Renal/GU negative Renal ROS  negative genitourinary   Musculoskeletal  (+) Arthritis ,    Abdominal   Peds  Hematology negative hematology ROS (+)   Anesthesia Other Findings   Reproductive/Obstetrics                             Anesthesia Physical Anesthesia Plan  ASA: 3  Anesthesia Plan: General   Post-op Pain Management: Minimal or no pain anticipated   Induction: Intravenous  PONV Risk Score and Plan: 2 and Dexamethasone, Ondansetron and Treatment may vary due to age or medical condition  Airway Management Planned: Oral ETT  Additional Equipment:   Intra-op Plan:   Post-operative Plan: Extubation in OR  Informed Consent: I have reviewed the patients History and Physical, chart, labs and discussed the procedure including the risks, benefits and alternatives for the proposed anesthesia with the patient or authorized representative who has indicated his/her understanding and acceptance.     Dental advisory given  Plan Discussed with: CRNA  Anesthesia Plan Comments:        Anesthesia Quick Evaluation

## 2022-10-25 NOTE — Anesthesia Postprocedure Evaluation (Signed)
Anesthesia Post Note  Patient: [name] Brown  Procedure(s) Performed: ATRIAL FIBRILLATION ABLATION     Patient location during evaluation: PACU Anesthesia Type: General Level of consciousness: awake and alert Pain management: pain level controlled Vital Signs Assessment: post-procedure vital signs reviewed and stable Respiratory status: spontaneous breathing, nonlabored ventilation, respiratory function stable and patient connected to nasal cannula oxygen Cardiovascular status: blood pressure returned to baseline and stable Postop Assessment: no apparent nausea or vomiting Anesthetic complications: no  There were no known notable events for this encounter.  Last Vitals:  Vitals:   10/25/22 0955 10/25/22 1000  BP: (!) 132/52 127/61  Pulse: (!) 48 (!) 51  Resp: 15 18  Temp:    SpO2: 96% 96%    Last Pain:  Vitals:   10/25/22 0926  TempSrc: Temporal  PainSc: 0-No pain                 Jessiah Wojnar L Daniela Hernan

## 2022-10-25 NOTE — Anesthesia Procedure Notes (Signed)
Procedure Name: Intubation Date/Time: 10/25/2022 7:45 AM  Performed by: Heide Scales, CRNAPre-anesthesia Checklist: Patient identified, Emergency Drugs available, Suction available and Patient being monitored Patient Re-evaluated:Patient Re-evaluated prior to induction Oxygen Delivery Method: Circle system utilized Preoxygenation: Pre-oxygenation with 100% oxygen Induction Type: IV induction Ventilation: Mask ventilation without difficulty and Oral airway inserted - appropriate to patient size Laryngoscope Size: Glidescope and 4 Grade View: Grade I Tube type: Oral Tube size: 7.5 mm Number of attempts: 1 Airway Equipment and Method: Oral airway and Rigid stylet Placement Confirmation: ETT inserted through vocal cords under direct vision, positive ETCO2 and breath sounds checked- equal and bilateral Secured at: 22 cm Tube secured with: Tape Dental Injury: Teeth and Oropharynx as per pre-operative assessment  Comments: History of difficult intubation. Elective glidescope 4 intubation.

## 2022-10-25 NOTE — Progress Notes (Signed)
Pt with ooze on bandage to L groin after ambulation. Bandage removed, no active bleeding. Pressure held for 5 min to be sure. Site level 0. MD notified to keep another 45 min. Will ambulate again then.

## 2022-10-25 NOTE — Interval H&P Note (Signed)
History and Physical Interval Note:  10/25/2022 6:55 AM  [name] Brown  has presented today for surgery, with the diagnosis of afib.  The various methods of treatment have been discussed with the patient and family. After consideration of risks, benefits and other options for treatment, the patient has consented to  Procedure(s): ATRIAL FIBRILLATION ABLATION (N/A) as a surgical intervention.  The patient's history has been reviewed, patient examined, no change in status, stable for surgery.  I have reviewed the patient's chart and labs.  Questions were answered to the patient's satisfaction.     Shakiyla Kook [employer]

## 2022-10-25 NOTE — Transfer of Care (Signed)
Immediate Anesthesia Transfer of Care Note  Patient: [name] Brown  Procedure(s) Performed: ATRIAL FIBRILLATION ABLATION  Patient Location: Cath Lab  Anesthesia Type:General  Level of Consciousness: awake  Airway & Oxygen Therapy: Patient Spontanous Breathing and Patient connected to face mask oxygen  Post-op Assessment: Report given to RN, Post -op Vital signs reviewed and stable, and Patient moving all extremities X 4  Post vital signs: Reviewed and stable  Last Vitals:  Vitals Value Taken Time  BP 143/78 10/25/22 0926  Temp    Pulse 66 10/25/22 0926  Resp 7 10/25/22 0926  SpO2 99 % 10/25/22 0926  Vitals shown include unvalidated device data.  Last Pain:  Vitals:   10/25/22 0619  TempSrc:   PainSc: 0-No pain         Complications: There were no known notable events for this encounter.

## 2022-10-26 ENCOUNTER — Encounter (HOSPITAL_COMMUNITY): Payer: Self-pay | Admitting: Cardiology

## 2022-11-22 ENCOUNTER — Encounter (HOSPITAL_COMMUNITY): Payer: Self-pay | Admitting: Physician Assistant

## 2022-11-22 ENCOUNTER — Ambulatory Visit (HOSPITAL_COMMUNITY)
Admission: RE | Admit: 2022-11-22 | Discharge: 2022-11-22 | Disposition: A | Discharge status: Home / Self Care | Payer: PPO | Source: Ambulatory Visit | Attending: Physician Assistant | Admitting: Physician Assistant

## 2022-11-22 VITALS — BP 140/80 | HR 62 | Ht 70.0 in | Wt 191.4 lb

## 2022-11-22 DIAGNOSIS — I4819 Other persistent atrial fibrillation: Secondary | ICD-10-CM | POA: Diagnosis not present

## 2022-11-22 DIAGNOSIS — G4733 Obstructive sleep apnea (adult) (pediatric): Secondary | ICD-10-CM | POA: Diagnosis not present

## 2022-11-22 DIAGNOSIS — Z7901 Long term (current) use of anticoagulants: Secondary | ICD-10-CM | POA: Diagnosis not present

## 2022-11-22 DIAGNOSIS — D6869 Other thrombophilia: Secondary | ICD-10-CM

## 2022-11-22 DIAGNOSIS — I10 Essential (primary) hypertension: Secondary | ICD-10-CM | POA: Diagnosis not present

## 2022-11-22 DIAGNOSIS — Z79899 Other long term (current) drug therapy: Secondary | ICD-10-CM | POA: Diagnosis not present

## 2022-11-22 DIAGNOSIS — Z87891 Personal history of nicotine dependence: Secondary | ICD-10-CM | POA: Insufficient documentation

## 2022-11-22 DIAGNOSIS — Z951 Presence of aortocoronary bypass graft: Secondary | ICD-10-CM | POA: Diagnosis not present

## 2022-11-22 DIAGNOSIS — I2510 Atherosclerotic heart disease of native coronary artery without angina pectoris: Secondary | ICD-10-CM | POA: Insufficient documentation

## 2022-11-22 NOTE — Progress Notes (Signed)
Primary Care Physician: Hoyt Koch, MD Primary Cardiologist: Dr Johnsie Cancel Primary Electrophysiologist: Dr Curt Bears Referring Physician: Dr Tonny Bollman is a 72 y.o. male with a history of coronary artery disease status post three-vessel CABG, hypertension, OSA, hyperlipidemia, and atrial fibrillation.  He was found to be in atrial fibrillation incidentally on office visit 08/30/2017.  He was put on Eliquis at the time.  Beta-blockers were not started due to resting bradycardia.  January 2020 he was noted increasing dyspnea on exertion.  He had a cardioversion 11/28/18 and was started on amiodarone. He does report today that he was diagnosed with OSA a few years ago but could not tolerate CPAP therapy. He is on Eliquis for a CHADS2VASC score of 3. Patient is s/p afib ablation 03/19/20. He had recurrent persistent afib and is s/p DCCV on 01/13/21. He initially did well but went back into persistent afib and underwent repeat ablation with Dr Curt Bears on 10/25/22.  On follow up today, patient reports that he has done well since the ablation with more energy. He was surprised to hear he was in afib today. He denies chest pain, swallowing pain, or groin issues. No bleeding issues on anticoagulation.   Today, he denies symptoms of palpitations, chest pain, orthopnea, PND, lower extremity edema, dizziness, presyncope, syncope, bleeding, or neurologic sequela. The patient is tolerating medications without difficulties and is otherwise without complaint today.    Atrial Fibrillation Risk Factors:  he does have symptoms or diagnosis of sleep apnea. he has an Camera operator.  he does not have a history of rheumatic fever. he does not have a history of alcohol use. The patient does not have a history of early familial atrial fibrillation or other arrhythmias.  he has a BMI of Body mass index is 27.46 kg/m.Marland Kitchen Filed Weights   11/22/22 1123  Weight: 86.8 kg    Family History  Problem  Relation Age of Onset   Heart attack Mother    Heart disease Mother    Heart attack Father    Heart disease Father    Rectal cancer Neg Hx    Stomach cancer Neg Hx    Pancreatic cancer Neg Hx    Esophageal cancer Neg Hx    Colon polyps Neg Hx    Colon cancer Neg Hx      Atrial Fibrillation Management history:  Previous antiarrhythmic drugs: amiodarone Previous cardioversions: 11/2018, 01/13/21 Previous ablations: 03/19/20, 10/25/22 CHADS2VASC score: 3 Anticoagulation history: Eliquis   Past Medical History:  Diagnosis Date   Aortic aneurysm ([place]) Endograf 2009   Arthritis    Atrial fibrillation (HCC)    Bradycardia    CAD (coronary artery disease) CABG 03/2000   Median sternotomy for coronary artery bypass grafting x 3 (left   HTN (hypertension)    Hx of CABG 2001   severe 3 vessel disease   Hx of colonic polyp 06/2010   Hyperplastic    Hyperlipidemia    Myocardial infarction [facility])    Sleep apnea    Past Surgical History:  Procedure Laterality Date   ATRIAL FIBRILLATION ABLATION N/A 03/19/2020   Procedure: ATRIAL FIBRILLATION ABLATION;  Surgeon: Constance Haw, MD;  Location: [place] [facility];  Service: Cardiovascular;  Laterality: N/A;   ATRIAL FIBRILLATION ABLATION N/A 10/25/2022   Procedure: ATRIAL FIBRILLATION ABLATION;  Surgeon: Constance Haw, MD;  Location: [place] [facility];  Service: Cardiovascular;  Laterality: N/A;   CARDIOVERSION N/A 11/28/2018   Procedure: CARDIOVERSION;  Surgeon: Elouise Munroe, MD;  Location: [facility] ENDOSCOPY;  Service: Cardiovascular;  Laterality: N/A;   CARDIOVERSION N/A 01/13/2021   Procedure: CARDIOVERSION;  Surgeon: Pixie Casino, MD;  Location: [facility] ENDOSCOPY;  Service: Cardiovascular;  Laterality: N/A;   COLONOSCOPY     CORONARY ARTERY BYPASS GRAFT     AAA stent graft on 12/01/2007 [facility]  coronary bypass in 2001. Dr. Roxy Manns.   DRUG INDUCED ENDOSCOPY Right 02/18/2020   Procedure: DRUG INDUCED  ENDOSCOPY;  Surgeon: Melida Quitter, MD;  Location: [place];  Service: ENT;  Laterality: Right;   FOOT SURGERY Left 07/19/2017   IMPLANTATION OF HYPOGLOSSAL NERVE STIMULATOR Right 07/30/2020   Procedure: IMPLANTATION OF HYPOGLOSSAL NERVE STIMULATOR Implantation of Chest wall respirator Sensor Electrode Electronic Analysis of Implanted Neurostimulator Pulse Generator;  Surgeon: Melida Quitter, MD;  Location: [place];  Service: ENT;  Laterality: Right;   INGUINAL HERNIA REPAIR Bilateral 08/06/2022   Procedure: LAPAROSCOPIC BILATERAL INGUINAL HERNIA REPAIR WITH MESH AND PRIMARY CLOSURE OF UMBILICAL HERNIA;  Surgeon: Felicie Morn, MD;  Location: [facility];  Service: General;  Laterality: Bilateral;   LEFT HEART CATHETERIZATION WITH CORONARY ANGIOGRAM N/A 10/26/2013   Procedure: LEFT HEART CATHETERIZATION WITH CORONARY ANGIOGRAM;  Surgeon: Peter M Martinique, MD;  Location: [facility] CATH LAB;  Service: Cardiovascular;  Laterality: N/A;   MASS EXCISION Right 08/10/2019   Procedure: EXCISION OF CYST FROM RIGHT SCALP;  Surgeon: Autumn Messing III, MD;  Location: [facility];  Service: General;  Laterality: Right;    Current Outpatient Medications  Medication Sig Dispense Refill   acetaminophen (TYLENOL) 500 MG tablet Take 1,000 mg by mouth every 4 (four) hours as needed for moderate pain.     albuterol (VENTOLIN HFA) 108 (90 Base) MCG/ACT inhaler INHALE 2 PUFFS INTO THE LUNGS EVERY 6 HOURS AS NEEDED FOR WHEEZING OR SHORTNESS OF BREATH (Patient taking differently: Inhale 2 puffs into the lungs at bedtime.) 54 g 1   aspirin EC 81 MG EC tablet Take 1 tablet (81 mg total) by mouth daily.     Biotin 10000 MCG TABS Take 10,000 mcg by mouth in the morning.     cholecalciferol (VITAMIN D) 25 MCG (1000 UT) tablet Take 1,000 Units by mouth every evening.     cyclobenzaprine (FLEXERIL) 5 MG tablet Take 1 tablet (5 mg total) by mouth 3 (three) times daily as needed for muscle  spasms. 30 tablet 1   diclofenac sodium (VOLTAREN) 1 % GEL Apply 2 g topically 4 (four) times daily. (Patient taking differently: Apply 2 g topically 4 (four) times daily as needed (pain).) 100 g 3   divalproex (DEPAKOTE ER) 500 MG 24 hr tablet TAKE 1 TABLET(500 MG) BY MOUTH DAILY 90 tablet 1   doxycycline (VIBRAMYCIN) 50 MG capsule Take 50 mg by mouth every evening.     Evolocumab (REPATHA SURECLICK) [id number] MG/ML SOAJ ADMINISTER 1 ML UNDER THE SKIN EVERY 14 DAYS 6 mL 3   ezetimibe (ZETIA) 10 MG tablet Take 1 tablet (10 mg total) by mouth daily. 90 tablet 3   furosemide (LASIX) 20 MG tablet TAKE 1 TABLET(20 MG) BY MOUTH TWICE DAILY 180 tablet 3   gabapentin (NEURONTIN) 300 MG capsule TAKE 1 CAPSULE(300 MG) BY MOUTH TWICE DAILY 180 capsule 3   Glucosamine-Chondroitin (GLUCOSAMINE CHONDR COMPLEX PO) Take 500 mg by mouth 2 (two) times daily.      guaiFENesin (MUCINEX) 600 MG 12 hr tablet Take 600 mg by mouth  2 (two) times daily.     hydrALAZINE (APRESOLINE) 25 MG tablet TAKE 1 TABLET BY MOUTH EVERY MORNING AND AT BEDTIME 180 tablet 3   isosorbide mononitrate (IMDUR) 30 MG 24 hr tablet TAKE 1 TABLET BY MOUTH EVERY DAY 90 tablet 2   montelukast (SINGULAIR) 10 MG tablet TAKE 1 TABLET(10 MG) BY MOUTH AT BEDTIME 90 tablet 3   Multiple Vitamin (MULTIVITAMIN WITH MINERALS) TABS tablet Take 1 tablet by mouth every evening.     nitroGLYCERIN (NITROSTAT) 0.4 MG SL tablet DISSOLVE 1 TABLET UNDER THE TONGUE EVERY 5 MINUTES FOR 3 DOSES AS NEEDED FOR CHEST PAIN 25 tablet 4   Omega-3 Fatty Acids (FISH OIL) 1200 MG CAPS Take 1,200 mg by mouth 2 (two) times daily.     potassium chloride SA (KLOR-CON M) 20 MEQ tablet TAKE 1 TABLET BY MOUTH EVERY DAY 90 tablet 3   ramipril (ALTACE) 10 MG capsule TAKE 2 CAPSULES(20 MG) BY MOUTH DAILY (Patient taking differently: Take 10 mg by mouth at bedtime.) 180 capsule 2   tamsulosin (FLOMAX) 0.4 MG CAPS capsule TAKE 1 CAPSULE(0.4 MG) BY MOUTH DAILY 90 capsule 3   vitamin C (ASCORBIC  ACID) 500 MG tablet Take 500 mg by mouth daily.     XARELTO 20 MG TABS tablet TAKE 1 TABLET(20 MG) BY MOUTH DAILY WITH SUPPER 90 tablet 3   zolpidem (AMBIEN CR) 12.5 MG CR tablet TAKE 1 TABLET(12.5 MG) BY MOUTH AT BEDTIME AS NEEDED FOR SLEEP (Patient taking differently: 12.5 mg at bedtime.) 30 tablet 5   No current facility-administered medications for this encounter.    Allergies  Allergen Reactions   Statins     Myalgias with elevated CPKs    Social History   Socioeconomic History   Marital status: Married    Spouse name: Not on file   Number of children: Not on file   Years of education: Not on file   Highest education level: Not on file  Occupational History   Not on file  Tobacco Use   Smoking status: Former    Packs/day: 2.00    Years: 30.00    Total pack years: 60.00    Types: Cigarettes    Quit date: 01/05/1992    Years since quitting: 30.9   Smokeless tobacco: Never   Tobacco comments:    Former smoker quit 19 yrs ago 11/22/22  Vaping Use   Vaping Use: Never used  Substance and Sexual Activity   Alcohol use: No   Drug use: Yes    Types: Marijuana   Sexual activity: Not on file  Other Topics Concern   Not on file  Social History Narrative   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   Social Determinants of Health   Financial Resource Strain: Low Risk  (04/30/2022)   Overall Financial Resource Strain (CARDIA)    Difficulty of Paying Living Expenses: Not hard at all  Food Insecurity: No Food Insecurity (04/30/2022)   Hunger Vital Sign    Worried About Running Out of Food in the Last Year: Never true    Ran Out of Food in the Last Year: Never true  Transportation Needs: No Transportation Needs (04/30/2022)   PRAPARE - Hydrologist (Medical): No    Lack of Transportation (Non-Medical): No  Physical Activity: Insufficiently Active (04/30/2022)   Exercise Vital Sign    Days of Exercise per Week: 3  days    Minutes  of Exercise per Session: 30 min  Stress: No Stress Concern Present (04/30/2022)   [place]    Feeling of Stress : Not at all  Social Connections: Moderately Isolated (04/30/2022)   Social Connection and Isolation Panel [NHANES]    Frequency of Communication with Friends and Family: Three times a week    Frequency of Social Gatherings with Friends and Family: Three times a week    Attends Religious Services: Never    Active Member of Clubs or Organizations: No    Attends Archivist Meetings: Never    Marital Status: Married  Human resources officer Violence: Not At Risk (04/30/2022)   Humiliation, Afraid, Rape, and Kick questionnaire    Fear of Current or Ex-Partner: No    Emotionally Abused: No    Physically Abused: No    Sexually Abused: No     ROS- All systems are reviewed and negative except as per the HPI above.  Physical Exam: Vitals:   11/22/22 1123  BP: (!) 140/80  Pulse: 62  Weight: 86.8 kg  Height: 5' 10"$  (1.778 m)    GEN- The patient is a well appearing male, alert and oriented x 3 today.   HEENT-head normocephalic, atraumatic, sclera clear, conjunctiva pink, hearing intact, trachea midline. Lungs- Clear to ausculation bilaterally, normal work of breathing Heart- irregular rate and rhythm, no murmurs, rubs or gallops  GI- soft, NT, ND, + BS Extremities- no clubbing, cyanosis, or edema MS- no significant deformity or atrophy Skin- no rash or lesion Psych- euthymic mood, full affect Neuro- strength and sensation are intact   Wt Readings from Last 3 Encounters:  11/22/22 86.8 kg  10/25/22 83.9 kg  10/14/22 88.5 kg    EKG today demonstrates  Afib Vent. rate 62 BPM PR interval * ms QRS duration 116 ms QT/QTcB 424/430 ms  Echo 01/21/20 demonstrated   1. Left ventricular ejection fraction, by estimation, is 55 to 60%. The  left ventricle has normal function. Left  ventricular endocardial border  not optimally defined to evaluate regional wall motion. There is mild left  ventricular hypertrophy. Left  ventricular diastolic parameters are consistent with Grade II diastolic  dysfunction (pseudonormalization).   2. Right ventricular systolic function is normal. The right ventricular  size is mildly enlarged. There is mildly elevated pulmonary artery  systolic pressure.   3. Left atrial size was moderately dilated.   4. Right atrial size was mildly dilated.   5. The mitral valve is normal in structure. Mild mitral valve  regurgitation.   6. The aortic valve is tricuspid. Aortic valve regurgitation is mild.  Mild aortic valve sclerosis is present, with no evidence of aortic valve  stenosis.   7. The inferior vena cava is normal in size with greater than 50%  respiratory variability, suggesting right atrial pressure of 3 mmHg.   Epic records are reviewed at length today  CHA2DS2-VASc Score = 3  The patient's score is based upon: CHF History: 0 HTN History: 1 Diabetes History: 0 Stroke History: 0 Vascular Disease History: 1 Age Score: 1 Gender Score: 0        ASSESSMENT AND PLAN: 1. Persistent Atrial Fibrillation (ICD10:  I48.19) The patient's CHA2DS2-VASc score is 3, indicating a 3.2% annual risk of stroke.   S/p afib ablation 03/19/20 with repeat ablation 10/25/22 Patient in rate controlled afib today, asymptomatic. Unclear duration. We discussed rhythm control options today. It is possible he is paroxysmal. He will use his  Kardia mobile device to evaluate if he is paroxysmal or persistent. If persistent, will proceed with DCCV. Patient to call clinic back by the end of the week.  Continue Eliquis 5 mg BID with no missed doses for 3 months post ablation.   2. Secondary Hypercoagulable State (ICD10:  D68.69) The patient is at significant risk for stroke/thromboembolism based upon his CHA2DS2-VASc Score of 3.  Continue Apixaban (Eliquis).   3.  HTN Stable, no changes today.  4. Obstructive sleep apnea Inspire device placed 07/30/20 Followed by Dr Redmond Baseman and Dr Radford Pax  5. CAD S/p CABG. No anginal symptoms.     Follow up with Dr Johnsie Cancel and Dr Curt Bears as scheduled. Sooner in AF clinic if he is persistent.    [facility] [address] [place], [place] [address] [phone] 11/22/2022 1:02 PM

## 2022-11-24 NOTE — Progress Notes (Signed)
CARDIOLOGY CONSULT NOTE       Patient ID: [name] Brown MRN: [id number] DOB/AGE: 1951/03/26 72 y.o.   Primary Physician: Hoyt Koch, MD Primary Cardiologist: Johnsie Cancel Reason for Consultation: Curt Bears    HPI:  72 y.o. with history of CAD/CABG distant LIMA to LAD, RIMA to PDA left radial to OM1. Cath 2015 with occluded radial to OM native vessel only 30-40% stenosed. Normal myovue 11/08/18.  PVD followed by Dr Doren Custard with EVAR  Duplex 05/10/18 no residual AAA or endoleak. [id number] LICA stenosis by duplex 01/27/22 TTE done 01/21/20 with EF 60-65% moderate LAE, mild AR/MR.    Afib noted in 2018 CHADVASC 3 on eliquis Afib ablation June 2021 and amiodarone d/c post procedure Required [facility] for recurrence 01/13/21. Repeat ablation 10/25/22 but still appears to be in rate controlled afib today   Sees Dr Radford Pax for OSA Intolerant to CPAP/Bipap.  Had hypoglossal N stimulator placement 07/30/20 Settings titrated recently   Getting GERD with eliquis has been on H2 blocker twice with symptoms returning when he stops Changed to [place] 04/22/21  Had bilateral hernia surgery with Dr Thermon Leyland 08/06/22 slow to recover and painful    ROS All other systems reviewed and negative except as noted above  Past Medical History:  Diagnosis Date   Aortic aneurysm [facility]) Endograf 2009   Arthritis    Atrial fibrillation (HCC)    Bradycardia    CAD (coronary artery disease) CABG 03/2000   Median sternotomy for coronary artery bypass grafting x 3 (left   HTN (hypertension)    Hx of CABG 2001   severe 3 vessel disease   Hx of colonic polyp 06/2010   Hyperplastic    Hyperlipidemia    Myocardial infarction [facility])    Sleep apnea     Family History  Problem Relation Age of Onset   Heart attack Mother    Heart disease Mother    Heart attack Father    Heart disease Father    Rectal cancer Neg Hx    Stomach cancer Neg Hx    Pancreatic cancer Neg Hx    Esophageal cancer Neg Hx    Colon polyps Neg Hx     Colon cancer Neg Hx     Social History   Socioeconomic History   Marital status: Married    Spouse name: Not on file   Number of children: Not on file   Years of education: Not on file   Highest education level: Not on file  Occupational History   Not on file  Tobacco Use   Smoking status: Former    Packs/day: 2.00    Years: 30.00    Total pack years: 60.00    Types: Cigarettes    Quit date: 01/05/1992    Years since quitting: 30.9   Smokeless tobacco: Never   Tobacco comments:    Former smoker quit 19 yrs ago 11/22/22  Vaping Use   Vaping Use: Never used  Substance and Sexual Activity   Alcohol use: No   Drug use: Yes    Types: Marijuana   Sexual activity: Not on file  Other Topics Concern   Not on file  Social History Narrative   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   Social Determinants of Health   Financial Resource Strain: Low Risk  (04/30/2022)   Overall Financial Resource Strain (CARDIA)    Difficulty of Paying Living Expenses: Not hard at all  Food Insecurity: No  Food Insecurity (04/30/2022)   Hunger Vital Sign    Worried About Running Out of Food in the Last Year: Never true    Ran Out of Food in the Last Year: Never true  Transportation Needs: No Transportation Needs (04/30/2022)   PRAPARE - Hydrologist (Medical): No    Lack of Transportation (Non-Medical): No  Physical Activity: Insufficiently Active (04/30/2022)   Exercise Vital Sign    Days of Exercise per Week: 3 days    Minutes of Exercise per Session: 30 min  Stress: No Stress Concern Present (04/30/2022)   [place]    Feeling of Stress : Not at all  Social Connections: Moderately Isolated (04/30/2022)   Social Connection and Isolation Panel [NHANES]    Frequency of Communication with Friends and Family: Three times a week    Frequency of Social Gatherings with  Friends and Family: Three times a week    Attends Religious Services: Never    Active Member of Clubs or Organizations: No    Attends Archivist Meetings: Never    Marital Status: Married  Human resources officer Violence: Not At Risk (04/30/2022)   Humiliation, Afraid, Rape, and Kick questionnaire    Fear of Current or Ex-Partner: No    Emotionally Abused: No    Physically Abused: No    Sexually Abused: No    Past Surgical History:  Procedure Laterality Date   ATRIAL FIBRILLATION ABLATION N/A 03/19/2020   Procedure: ATRIAL FIBRILLATION ABLATION;  Surgeon: Constance Haw, MD;  Location: [place] CV LAB;  Service: Cardiovascular;  Laterality: N/A;   ATRIAL FIBRILLATION ABLATION N/A 10/25/2022   Procedure: ATRIAL FIBRILLATION ABLATION;  Surgeon: Constance Haw, MD;  Location: [place] CV LAB;  Service: Cardiovascular;  Laterality: N/A;   CARDIOVERSION N/A 11/28/2018   Procedure: CARDIOVERSION;  Surgeon: Elouise Munroe, MD;  Location: [facility] ENDOSCOPY;  Service: Cardiovascular;  Laterality: N/A;   CARDIOVERSION N/A 01/13/2021   Procedure: CARDIOVERSION;  Surgeon: Pixie Casino, MD;  Location: [facility] ENDOSCOPY;  Service: Cardiovascular;  Laterality: N/A;   COLONOSCOPY     CORONARY ARTERY BYPASS GRAFT     AAA stent graft on 12/01/2007 at [facility]  coronary bypass in 2001. Dr. Roxy Manns.   DRUG INDUCED ENDOSCOPY Right 02/18/2020   Procedure: DRUG INDUCED ENDOSCOPY;  Surgeon: Melida Quitter, MD;  Location: [place];  Service: ENT;  Laterality: Right;   FOOT SURGERY Left 07/19/2017   IMPLANTATION OF HYPOGLOSSAL NERVE STIMULATOR Right 07/30/2020   Procedure: IMPLANTATION OF HYPOGLOSSAL NERVE STIMULATOR Implantation of Chest wall respirator Sensor Electrode Electronic Analysis of Implanted Neurostimulator Pulse Generator;  Surgeon: Melida Quitter, MD;  Location: [place];  Service: ENT;  Laterality: Right;   INGUINAL HERNIA REPAIR  Bilateral 08/06/2022   Procedure: LAPAROSCOPIC BILATERAL INGUINAL HERNIA REPAIR WITH MESH AND PRIMARY CLOSURE OF UMBILICAL HERNIA;  Surgeon: Felicie Morn, MD;  Location: [facility];  Service: General;  Laterality: Bilateral;   LEFT HEART CATHETERIZATION WITH CORONARY ANGIOGRAM N/A 10/26/2013   Procedure: LEFT HEART CATHETERIZATION WITH CORONARY ANGIOGRAM;  Surgeon: Avril Busser M Martinique, MD;  Location: [facility] CATH LAB;  Service: Cardiovascular;  Laterality: N/A;   MASS EXCISION Right 08/10/2019   Procedure: EXCISION OF CYST FROM RIGHT SCALP;  Surgeon: Jovita Kussmaul, MD;  Location: [place];  Service: General;  Laterality: Right;      Current Outpatient Medications:  acetaminophen (TYLENOL) 500 MG tablet, Take 1,000 mg by mouth every 4 (four) hours as needed for moderate pain., Disp: , Rfl:    albuterol (VENTOLIN HFA) 108 (90 Base) MCG/ACT inhaler, INHALE 2 PUFFS INTO THE LUNGS EVERY 6 HOURS AS NEEDED FOR WHEEZING OR SHORTNESS OF BREATH (Patient taking differently: Inhale 2 puffs into the lungs at bedtime.), Disp: 54 g, Rfl: 1   aspirin EC 81 MG EC tablet, Take 1 tablet (81 mg total) by mouth daily., Disp: , Rfl:    Biotin 10000 MCG TABS, Take 10,000 mcg by mouth in the morning., Disp: , Rfl:    cholecalciferol (VITAMIN D) 25 MCG (1000 UT) tablet, Take 1,000 Units by mouth every evening., Disp: , Rfl:    cyclobenzaprine (FLEXERIL) 5 MG tablet, Take 1 tablet (5 mg total) by mouth 3 (three) times daily as needed for muscle spasms., Disp: 30 tablet, Rfl: 1   diclofenac sodium (VOLTAREN) 1 % GEL, Apply 2 g topically 4 (four) times daily. (Patient taking differently: Apply 2 g topically 4 (four) times daily as needed (pain).), Disp: 100 g, Rfl: 3   divalproex (DEPAKOTE ER) 500 MG 24 hr tablet, TAKE 1 TABLET(500 MG) BY MOUTH DAILY, Disp: 90 tablet, Rfl: 1   doxycycline (VIBRAMYCIN) 50 MG capsule, Take 50 mg by mouth every evening., Disp: , Rfl:    Evolocumab (REPATHA SURECLICK) [id number] MG/ML  SOAJ, ADMINISTER 1 ML UNDER THE SKIN EVERY 14 DAYS, Disp: 6 mL, Rfl: 3   ezetimibe (ZETIA) 10 MG tablet, Take 1 tablet (10 mg total) by mouth daily., Disp: 90 tablet, Rfl: 3   furosemide (LASIX) 20 MG tablet, TAKE 1 TABLET(20 MG) BY MOUTH TWICE DAILY, Disp: 180 tablet, Rfl: 3   gabapentin (NEURONTIN) 300 MG capsule, TAKE 1 CAPSULE(300 MG) BY MOUTH TWICE DAILY, Disp: 180 capsule, Rfl: 3   Glucosamine-Chondroitin (GLUCOSAMINE CHONDR COMPLEX PO), Take 500 mg by mouth 2 (two) times daily. , Disp: , Rfl:    guaiFENesin (MUCINEX) 600 MG 12 hr tablet, Take 600 mg by mouth 2 (two) times daily., Disp: , Rfl:    hydrALAZINE (APRESOLINE) 25 MG tablet, TAKE 1 TABLET BY MOUTH EVERY MORNING AND AT BEDTIME, Disp: 180 tablet, Rfl: 3   isosorbide mononitrate (IMDUR) 30 MG 24 hr tablet, TAKE 1 TABLET BY MOUTH EVERY DAY, Disp: 90 tablet, Rfl: 2   montelukast (SINGULAIR) 10 MG tablet, TAKE 1 TABLET(10 MG) BY MOUTH AT BEDTIME, Disp: 90 tablet, Rfl: 3   Multiple Vitamin (MULTIVITAMIN WITH MINERALS) TABS tablet, Take 1 tablet by mouth every evening., Disp: , Rfl:    nitroGLYCERIN (NITROSTAT) 0.4 MG SL tablet, DISSOLVE 1 TABLET UNDER THE TONGUE EVERY 5 MINUTES FOR 3 DOSES AS NEEDED FOR CHEST PAIN, Disp: 25 tablet, Rfl: 4   Omega-3 Fatty Acids (FISH OIL) 1200 MG CAPS, Take 1,200 mg by mouth 2 (two) times daily., Disp: , Rfl:    potassium chloride SA (KLOR-CON M) 20 MEQ tablet, TAKE 1 TABLET BY MOUTH EVERY DAY, Disp: 90 tablet, Rfl: 3   ramipril (ALTACE) 10 MG capsule, TAKE 2 CAPSULES(20 MG) BY MOUTH DAILY (Patient taking differently: Take 10 mg by mouth at bedtime.), Disp: 180 capsule, Rfl: 2   tamsulosin (FLOMAX) 0.4 MG CAPS capsule, TAKE 1 CAPSULE(0.4 MG) BY MOUTH DAILY, Disp: 90 capsule, Rfl: 3   vitamin C (ASCORBIC ACID) 500 MG tablet, Take 500 mg by mouth daily., Disp: , Rfl:    XARELTO 20 MG TABS tablet, TAKE 1 TABLET(20 MG) BY MOUTH DAILY WITH SUPPER,  Disp: 90 tablet, Rfl: 3   zolpidem (AMBIEN CR) 12.5 MG CR tablet,  TAKE 1 TABLET(12.5 MG) BY MOUTH AT BEDTIME AS NEEDED FOR SLEEP (Patient taking differently: 12.5 mg at bedtime.), Disp: 30 tablet, Rfl: 5    Physical Exam:   Affect appropriate Healthy:  appears stated age 43: nerve stimulator present  Neck supple with no adenopathy JVP normal loud left bruits no thyromegaly Lungs clear with no wheezing and good diaphragmatic motion Heart:  S1/S2 SEM murmur, no rub, gallop or click PMI normal post sternotomy and left radial harvest  Abdomen: benighn, BS positve, no tenderness, no AAA no bruit.  No HSM or HJR post hernia repair bilateral  Distal pulses intact with no bruits No edema Neuro non-focal Skin warm and dry No muscular weakness Inspiris generator under right clavicle    Labs:   Lab Results  Component Value Date   WBC 6.2 10/14/2022   HGB 14.4 10/14/2022   HCT 41.9 10/14/2022   MCV 98 (H) 10/14/2022   PLT 225 10/14/2022   No results for input(s): "NA", "K", "CL", "CO2", "BUN", "CREATININE", "CALCIUM", "PROT", "BILITOT", "ALKPHOS", "ALT", "AST", "GLUCOSE" in the last 168 hours.  Invalid input(s): "LABALBU" Lab Results  Component Value Date   CKTOTAL 102 02/21/2015   TROPONINI 0.06 (HH) 11/29/2018    Lab Results  Component Value Date   CHOL 109 02/04/2021   CHOL 194 11/04/2020   CHOL 114 11/29/2018   Lab Results  Component Value Date   HDL 34 (L) 02/04/2021   HDL 32 (L) 11/04/2020   HDL 24 (L) 11/29/2018   Lab Results  Component Value Date   LDLCALC 59 02/04/2021   LDLCALC 141 (H) 11/04/2020   LDLCALC 84 11/29/2018   Lab Results  Component Value Date   TRIG 78 02/04/2021   TRIG 114 11/04/2020   TRIG 32 11/29/2018   Lab Results  Component Value Date   CHOLHDL 3.2 02/04/2021   CHOLHDL 6.1 (H) 11/04/2020   CHOLHDL 4.8 11/29/2018   No results found for: "LDLDIRECT"    Radiology: SLEEP STUDY DOCUMENTS  Result Date: 12/06/2022 Ordered by an unspecified provider.   EKG: SR rate 68 normal 03/26/21     ASSESSMENT AND PLAN:   1:  CAD/CABG:  known occlusion of radial to OM with non obstructive native disease in this area Non ischemic myovue 11/08/18 continue medical RX 2. PVD:  f/u [facility] for EVAR [place] 05/27/21 no endoleak  3. PAF:  F/U [place] post ablation x 2  Amiodarone d/c [facility] April 2022 and repeat ablation January 2024 In rate controlled afib today CHADVASC 3 on Xarelto with less GERD than when taking eliquis 4. HTN:  Well controlled.  Continue current medications and low sodium Dash type diet.   5. OSA:  with hypoglossal nerve stimulator f/u sleep study Dr Radford Pax to titrate settings better  6. HLD:  on zetia and repatha LDL 59 02/04/21 7. Carotid:  [id number] LICA stenosis f/u duplex April 2024   Carotid duplex 01/2023 F/U in a year   Signed: Jenkins Rouge 12/08/2022, 8:05 AM

## 2022-11-30 ENCOUNTER — Ambulatory Visit (HOSPITAL_BASED_OUTPATIENT_CLINIC_OR_DEPARTMENT_OTHER): Payer: PPO | Attending: Cardiology | Admitting: Cardiology

## 2022-11-30 VITALS — Ht 70.0 in | Wt 185.0 lb

## 2022-11-30 DIAGNOSIS — I10 Essential (primary) hypertension: Secondary | ICD-10-CM

## 2022-11-30 DIAGNOSIS — I4891 Unspecified atrial fibrillation: Secondary | ICD-10-CM | POA: Insufficient documentation

## 2022-11-30 DIAGNOSIS — G4733 Obstructive sleep apnea (adult) (pediatric): Secondary | ICD-10-CM | POA: Diagnosis not present

## 2022-11-30 DIAGNOSIS — I493 Ventricular premature depolarization: Secondary | ICD-10-CM | POA: Insufficient documentation

## 2022-12-01 NOTE — Procedures (Signed)
   Patient Name: [name] Brown, [name] Brown Date: 11/30/2022 Gender: Male D.O.B: October 26, 1950 Age (years): 49 Referring Provider: Fransico Him MD, ABSM Height (inches): 70 Interpreting Physician: Fransico Him MD, ABSM Weight (lbs): 185 RPSGT: Laren Everts BMI: 27 MRN: [id number] Neck Size: 16.00  CLINICAL INFORMATION The patient is referred for a Inspire titration to treat sleep apnea.  SLEEP STUDY TECHNIQUE As per the AASM Manual for the Scoring of Sleep and Associated Events v2.3 (April 2016) with a hypopnea requiring 4% desaturations.  The channels recorded and monitored were frontal, central and occipital EEG, electrooculogram (EOG), submentalis EMG (chin), nasal and oral airflow, thoracic and abdominal wall motion, anterior tibialis EMG, snore microphone, electrocardiogram, and pulse oximetry. Continuous positive airway pressure (CPAP) was initiated at the beginning of the study and titrated to treat sleep-disordered breathing.  MEDICATIONS Medications self-administered by patient taken the night of the study : ACETAMINOPHEN, ALBUTEROL, AMBIEN, ASPIRIN, BENADRYL, GABAPENTIN, MONTELUKAST, TRAZODONE  TECHNICIAN COMMENTS Comments added by technician: Patient was restless all through the night. Comments added by scorer: N/A  RESPIRATORY PARAMETERS Optimal PAP Pressure (cm):2.4  AHI at Optimal Pressure (/hr):6.4 Overall Minimal O2 (%):88.0  Supine % at Optimal Pressure (%):0 Minimal O2 at Optimal Pressure (%): 89.0   SLEEP ARCHITECTURE The study was initiated at 10:21:30 PM and ended at 5:13:59 AM.  Sleep onset time was 2.9 minutes and the sleep efficiency was 62.1%. The total sleep time was 256 minutes.  The patient spent 22.7% of the night in stage N1 sleep, 65.6% in stage N2 sleep, 0.0% in stage N3 and 11.7% in REM.Stage REM latency was 297.5 minutes  Wake after sleep onset was 153.6. Alpha intrusion was absent. Supine sleep was 0.59%.  CARDIAC DATA The 2 lead EKG  demonstrated atrial fibrillation. The mean heart rate was 54.0 beats per minute. Other EKG findings include: PVCs.  LEG MOVEMENT DATA The total Periodic Limb Movements of Sleep (PLMS) were 0. The PLMS index was 0.0. A PLMS index of <15 is considered normal in adults.  IMPRESSIONS - The optimal PAP pressure was 2.4V with an AHI of 6/hr. - Mild oxygen desaturations were observed during this titration (min O2 = 88.0%). - No snoring was audible during this study. - 2-lead EKG demonstrated: PVCs and atrial fibrillation - Clinically significant periodic limb movements were not noted during this study. Arousals associated with PLMs were rare.  DIAGNOSIS - Obstructive Sleep Apnea (G47.33) - Atrial Fibrillation  RECOMMENDATIONS - Inspire device was reprogrammed back to initial settings on arrival at the sleep lab with a range of 2.2 to 2.6V. - Avoid alcohol, sedatives and other CNS depressants that may worsen sleep apnea and disrupt normal sleep architecture. - Sleep hygiene should be reviewed to assess factors that may improve sleep quality. - Weight management and regular exercise should be initiated or continued. - Return to Sleep Center for re-evaluation after 4 weeks of therapy  [Electronically signed] 12/01/2022 04:07 PM  Fransico Him MD, ABSM Diplomate, American Board of Sleep Medicine

## 2022-12-02 ENCOUNTER — Telehealth: Payer: Self-pay | Admitting: *Deleted

## 2022-12-02 NOTE — Telephone Encounter (Signed)
-----   Message from Lauralee Evener, [place] sent at 12/02/2022 11:42 AM EST -----  ----- Message ----- From: Sueanne Margarita, MD Sent: 12/01/2022   4:10 PM EST To: Cv Div Sleep Studies  Successful Inspire tittration.  Please forward a copy of this sleep study to [place] with Dawna Part - let patient know that he has no nocturnal hypoxemia so no need for O2

## 2022-12-02 NOTE — Telephone Encounter (Signed)
The patient has been notified of the result and verbalized understanding.  All questions (if any) were answered. Marolyn Hammock, [place] 12/02/2022 3:46 PM    Pt is  agreeable to his results. A copy of the sleep study was sent to [employer].

## 2022-12-08 ENCOUNTER — Ambulatory Visit: Payer: PPO | Attending: Cardiovascular Disease | Admitting: Cardiovascular Disease

## 2022-12-08 ENCOUNTER — Encounter: Payer: Self-pay | Admitting: Cardiovascular Disease

## 2022-12-08 VITALS — BP 142/62 | HR 73 | Ht 70.0 in | Wt 192.2 lb

## 2022-12-08 DIAGNOSIS — I480 Paroxysmal atrial fibrillation: Secondary | ICD-10-CM

## 2022-12-08 DIAGNOSIS — R0989 Other specified symptoms and signs involving the circulatory and respiratory systems: Secondary | ICD-10-CM | POA: Diagnosis not present

## 2022-12-08 DIAGNOSIS — Z951 Presence of aortocoronary bypass graft: Secondary | ICD-10-CM

## 2022-12-08 NOTE — Patient Instructions (Signed)
Medication Instructions:  Your physician recommends that you continue on your current medications as directed. Please refer to the Current Medication list given to you today.  *If you need a refill on your cardiac medications before your next appointment, please call your pharmacy*  Lab Work: If you have labs (blood work) drawn today and your tests are completely normal, you will receive your results only by: [place] (if you have MyChart) OR A paper copy in the mail If you have any lab test that is abnormal or we need to change your treatment, we will call you to review the results.  Testing/Procedures: Your physician has requested that you have a carotid duplex in April. This test is an ultrasound of the carotid arteries in your neck. It looks at blood flow through these arteries that supply the brain with blood. Allow one hour for this exam. There are no restrictions or special instructions.  Follow-Up: At [facility], you and your health needs are our priority.  As part of our continuing mission to provide you with exceptional heart care, we have created designated Provider Care Teams.  These Care Teams include your primary Cardiologist (physician) and Advanced Practice Providers (APPs -  Physician Assistants and Nurse Practitioners) who all work together to provide you with the care you need, when you need it.  We recommend signing up for the patient portal called "MyChart".  Sign up information is provided on this After Visit Summary.  MyChart is used to connect with patients for Virtual Visits (Telemedicine).  Patients are able to view lab/test results, encounter notes, upcoming appointments, etc.  Non-urgent messages can be sent to your provider as well.   To learn more about what you can do with MyChart, go to [email or web address].    Your next appointment:   1 year(s)  Provider:   Jenkins Rouge, MD

## 2022-12-10 ENCOUNTER — Telehealth: Payer: PPO

## 2022-12-11 ENCOUNTER — Other Ambulatory Visit: Payer: Self-pay | Admitting: Internal Medicine

## 2022-12-17 ENCOUNTER — Encounter (HOSPITAL_COMMUNITY): Payer: Self-pay

## 2022-12-17 ENCOUNTER — Other Ambulatory Visit (HOSPITAL_COMMUNITY): Payer: Self-pay | Admitting: *Deleted

## 2022-12-17 ENCOUNTER — Telehealth (HOSPITAL_COMMUNITY): Payer: Self-pay | Admitting: *Deleted

## 2022-12-17 DIAGNOSIS — I4819 Other persistent atrial fibrillation: Secondary | ICD-10-CM

## 2022-12-17 NOTE — Telephone Encounter (Signed)
Patient states he purchased a kardia to follow his heart rhythm after afib visit beginning of February. He continues in AFib. Hrs controlled. Per Adline Peals PA will schedule pt for dccv. Pt in agreement. Cardioversion scheduled for 3/26 NPO after MN no missed doses of Xarelto.

## 2022-12-19 ENCOUNTER — Other Ambulatory Visit: Payer: Self-pay | Admitting: Cardiovascular Disease

## 2022-12-30 ENCOUNTER — Other Ambulatory Visit: Payer: Self-pay | Admitting: Cardiovascular Disease

## 2022-12-30 ENCOUNTER — Ambulatory Visit (HOSPITAL_COMMUNITY)
Admission: RE | Admit: 2022-12-30 | Discharge: 2022-12-30 | Disposition: A | Discharge status: Home / Self Care | Payer: PPO | Source: Ambulatory Visit | Attending: Physician Assistant | Admitting: Physician Assistant

## 2022-12-30 VITALS — BP 142/76 | HR 63 | Ht 70.0 in | Wt 192.4 lb

## 2022-12-30 DIAGNOSIS — Z79899 Other long term (current) drug therapy: Secondary | ICD-10-CM | POA: Insufficient documentation

## 2022-12-30 DIAGNOSIS — Z87891 Personal history of nicotine dependence: Secondary | ICD-10-CM | POA: Insufficient documentation

## 2022-12-30 DIAGNOSIS — Z7901 Long term (current) use of anticoagulants: Secondary | ICD-10-CM | POA: Insufficient documentation

## 2022-12-30 DIAGNOSIS — I4819 Other persistent atrial fibrillation: Secondary | ICD-10-CM | POA: Insufficient documentation

## 2022-12-30 DIAGNOSIS — I10 Essential (primary) hypertension: Secondary | ICD-10-CM | POA: Diagnosis not present

## 2022-12-30 DIAGNOSIS — Z8249 Family history of ischemic heart disease and other diseases of the circulatory system: Secondary | ICD-10-CM | POA: Diagnosis not present

## 2022-12-30 DIAGNOSIS — Z951 Presence of aortocoronary bypass graft: Secondary | ICD-10-CM | POA: Insufficient documentation

## 2022-12-30 DIAGNOSIS — G4733 Obstructive sleep apnea (adult) (pediatric): Secondary | ICD-10-CM | POA: Insufficient documentation

## 2022-12-30 DIAGNOSIS — I2510 Atherosclerotic heart disease of native coronary artery without angina pectoris: Secondary | ICD-10-CM | POA: Diagnosis not present

## 2022-12-30 DIAGNOSIS — D6869 Other thrombophilia: Secondary | ICD-10-CM

## 2022-12-30 LAB — CBC
HCT: 41.3 % (ref 39.0–52.0)
Hemoglobin: 14.1 g/dL (ref 13.0–17.0)
MCH: 34.1 pg — ABNORMAL HIGH (ref 26.0–34.0)
MCHC: 34.1 g/dL (ref 30.0–36.0)
MCV: 100 fL (ref 80.0–100.0)
Platelets: 186 10*3/uL (ref 150–400)
RBC: 4.13 MIL/uL — ABNORMAL LOW (ref 4.22–5.81)
RDW: 14.5 % (ref 11.5–15.5)
WBC: 6.2 10*3/uL (ref 4.0–10.5)
nRBC: 0 % (ref 0.0–0.2)

## 2022-12-30 LAB — BASIC METABOLIC PANEL
Anion gap: 9 (ref 5–15)
BUN: 25 mg/dL — ABNORMAL HIGH (ref 8–23)
CO2: 28 mmol/L (ref 22–32)
Calcium: 9.7 mg/dL (ref 8.9–10.3)
Chloride: 103 mmol/L (ref 98–111)
Creatinine, Ser: 1.2 mg/dL (ref 0.61–1.24)
GFR, Estimated: >60 mL/min (ref >60)
Glucose, Bld: 101 mg/dL — ABNORMAL HIGH (ref 70–99)
Potassium: 4.5 mmol/L (ref 3.5–5.1)
Sodium: 140 mmol/L (ref 135–145)

## 2022-12-30 NOTE — Progress Notes (Signed)
Primary Care Physician: Hoyt Koch, MD Primary Cardiologist: Dr Johnsie Cancel Primary Electrophysiologist: Dr Curt Bears Referring Physician: Dr Tonny Bollman is a 72 y.o. male with a history of coronary artery disease status post three-vessel CABG, hypertension, OSA, hyperlipidemia, and atrial fibrillation.  He was found to be in atrial fibrillation incidentally on office visit 08/30/2017.  He was put on Eliquis at the time.  Beta-blockers were not started due to resting bradycardia.  January 2020 he was noted increasing dyspnea on exertion.  He had a cardioversion 11/28/18 and was started on amiodarone. He does report today that he was diagnosed with OSA a few years ago but could not tolerate CPAP therapy. He is on Eliquis for a CHADS2VASC score of 3. Patient is s/p afib ablation 03/19/20. He had recurrent persistent afib and is s/p DCCV on 01/13/21. He initially did well but went back into persistent afib and underwent repeat ablation with Dr Curt Bears on 10/25/22.  On follow up today, patient has been in persistent afib on his Kardia mobile device. Also in afib when he saw Dr Johnsie Cancel on 12/08/22. He is scheduled for DCCV on 01/04/23. No bleeding issues on anticoagulation.   Today, he denies symptoms of palpitations, chest pain, orthopnea, PND, lower extremity edema, dizziness, presyncope, syncope, bleeding, or neurologic sequela. The patient is tolerating medications without difficulties and is otherwise without complaint today.    Atrial Fibrillation Risk Factors:  he does have symptoms or diagnosis of sleep apnea. he has an Camera operator.  he does not have a history of rheumatic fever. he does not have a history of alcohol use. The patient does not have a history of early familial atrial fibrillation or other arrhythmias.  he has a BMI of Body mass index is 27.61 kg/m.Marland Kitchen Filed Weights   12/30/22 0829  Weight: 87.3 kg    Family History  Problem Relation Age of Onset   Heart  attack Mother    Heart disease Mother    Heart attack Father    Heart disease Father    Rectal cancer Neg Hx    Stomach cancer Neg Hx    Pancreatic cancer Neg Hx    Esophageal cancer Neg Hx    Colon polyps Neg Hx    Colon cancer Neg Hx      Atrial Fibrillation Management history:  Previous antiarrhythmic drugs: amiodarone Previous cardioversions: 11/2018, 01/13/21 Previous ablations: 03/19/20, 10/25/22 CHADS2VASC score: 3 Anticoagulation history: Eliquis   Past Medical History:  Diagnosis Date   Aortic aneurysm ([place]) Endograf 2009   Arthritis    Atrial fibrillation (HCC)    Bradycardia    CAD (coronary artery disease) CABG 03/2000   Median sternotomy for coronary artery bypass grafting x 3 (left   HTN (hypertension)    Hx of CABG 2001   severe 3 vessel disease   Hx of colonic polyp 06/2010   Hyperplastic    Hyperlipidemia    Myocardial infarction [facility])    Sleep apnea    Past Surgical History:  Procedure Laterality Date   ATRIAL FIBRILLATION ABLATION N/A 03/19/2020   Procedure: ATRIAL FIBRILLATION ABLATION;  Surgeon: Constance Haw, MD;  Location: [place] [facility];  Service: Cardiovascular;  Laterality: N/A;   ATRIAL FIBRILLATION ABLATION N/A 10/25/2022   Procedure: ATRIAL FIBRILLATION ABLATION;  Surgeon: Constance Haw, MD;  Location: [place] [facility];  Service: Cardiovascular;  Laterality: N/A;   CARDIOVERSION N/A 11/28/2018   Procedure: CARDIOVERSION;  Surgeon: Elouise Munroe,  MD;  Location: [place];  Service: Cardiovascular;  Laterality: N/A;   CARDIOVERSION N/A 01/13/2021   Procedure: CARDIOVERSION;  Surgeon: Pixie Casino, MD;  Location: [facility] ENDOSCOPY;  Service: Cardiovascular;  Laterality: N/A;   COLONOSCOPY     CORONARY ARTERY BYPASS GRAFT     AAA stent graft on 12/01/2007 at [facility]  coronary bypass in 2001. Dr. Roxy Manns.   DRUG INDUCED ENDOSCOPY Right 02/18/2020   Procedure: DRUG INDUCED ENDOSCOPY;  Surgeon: Melida Quitter,  MD;  Location: [place];  Service: ENT;  Laterality: Right;   FOOT SURGERY Left 07/19/2017   IMPLANTATION OF HYPOGLOSSAL NERVE STIMULATOR Right 07/30/2020   Procedure: IMPLANTATION OF HYPOGLOSSAL NERVE STIMULATOR Implantation of Chest wall respirator Sensor Electrode Electronic Analysis of Implanted Neurostimulator Pulse Generator;  Surgeon: Melida Quitter, MD;  Location: [place];  Service: ENT;  Laterality: Right;   INGUINAL HERNIA REPAIR Bilateral 08/06/2022   Procedure: LAPAROSCOPIC BILATERAL INGUINAL HERNIA REPAIR WITH MESH AND PRIMARY CLOSURE OF UMBILICAL HERNIA;  Surgeon: Felicie Morn, MD;  Location: [facility];  Service: General;  Laterality: Bilateral;   LEFT HEART CATHETERIZATION WITH CORONARY ANGIOGRAM N/A 10/26/2013   Procedure: LEFT HEART CATHETERIZATION WITH CORONARY ANGIOGRAM;  Surgeon: Peter M Martinique, MD;  Location: [facility] CATH LAB;  Service: Cardiovascular;  Laterality: N/A;   MASS EXCISION Right 08/10/2019   Procedure: EXCISION OF CYST FROM RIGHT SCALP;  Surgeon: Autumn Messing III, MD;  Location: [place];  Service: General;  Laterality: Right;    Current Outpatient Medications  Medication Sig Dispense Refill   acetaminophen (TYLENOL) 500 MG tablet Take 1,000 mg by mouth every 4 (four) hours as needed for moderate pain.     albuterol (VENTOLIN HFA) 108 (90 Base) MCG/ACT inhaler INHALE 2 PUFFS INTO THE LUNGS EVERY 6 HOURS AS NEEDED FOR WHEEZING OR SHORTNESS OF BREATH (Patient taking differently: Inhale 2 puffs into the lungs at bedtime.) 54 g 1   aspirin EC 81 MG EC tablet Take 1 tablet (81 mg total) by mouth daily.     Biotin 10000 MCG TABS Take 10,000 mcg by mouth in the morning.     cholecalciferol (VITAMIN D) 25 MCG (1000 UT) tablet Take 1,000 Units by mouth every evening.     cyclobenzaprine (FLEXERIL) 5 MG tablet Take 1 tablet (5 mg total) by mouth 3 (three) times daily as needed for muscle spasms. 30 tablet 1   diclofenac  sodium (VOLTAREN) 1 % GEL Apply 2 g topically 4 (four) times daily. (Patient taking differently: Apply 2 g topically 4 (four) times daily as needed (pain).) 100 g 3   divalproex (DEPAKOTE ER) 500 MG 24 hr tablet TAKE 1 TABLET(500 MG) BY MOUTH DAILY 90 tablet 1   doxycycline (VIBRAMYCIN) 50 MG capsule Take 50 mg by mouth every evening.     Evolocumab (REPATHA SURECLICK) [id number] MG/ML SOAJ ADMINISTER 1 ML UNDER THE SKIN EVERY 14 DAYS 6 mL 3   ezetimibe (ZETIA) 10 MG tablet Take 1 tablet (10 mg total) by mouth daily. 90 tablet 3   furosemide (LASIX) 20 MG tablet TAKE 1 TABLET(20 MG) BY MOUTH TWICE DAILY 180 tablet 3   gabapentin (NEURONTIN) 300 MG capsule TAKE 1 CAPSULE(300 MG) BY MOUTH TWICE DAILY 180 capsule 3   Glucosamine-Chondroitin (GLUCOSAMINE CHONDR COMPLEX PO) Take 500 mg by mouth 2 (two) times daily.      guaiFENesin (MUCINEX) 600 MG 12 hr tablet Take 600 mg by mouth 2 (two) times daily.  hydrALAZINE (APRESOLINE) 25 MG tablet TAKE 1 TABLET BY MOUTH EVERY MORNING AND AT BEDTIME 180 tablet 3   isosorbide mononitrate (IMDUR) 30 MG 24 hr tablet TAKE 1 TABLET BY MOUTH EVERY DAY 90 tablet 2   montelukast (SINGULAIR) 10 MG tablet TAKE 1 TABLET(10 MG) BY MOUTH AT BEDTIME 90 tablet 3   Multiple Vitamin (MULTIVITAMIN WITH MINERALS) TABS tablet Take 1 tablet by mouth every evening.     nitroGLYCERIN (NITROSTAT) 0.4 MG SL tablet DISSOLVE 1 TABLET UNDER THE TONGUE EVERY 5 MINUTES FOR 3 DOSES AS NEEDED FOR CHEST PAIN 25 tablet 4   Omega-3 Fatty Acids (FISH OIL) 1200 MG CAPS Take 1,200 mg by mouth 2 (two) times daily.     potassium chloride SA (KLOR-CON M) 20 MEQ tablet TAKE 1 TABLET BY MOUTH EVERY DAY 90 tablet 3   ramipril (ALTACE) 10 MG capsule TAKE 2 CAPSULES(20 MG) BY MOUTH DAILY (Patient taking differently: Take 20 mg by mouth at bedtime.) 180 capsule 2   tamsulosin (FLOMAX) 0.4 MG CAPS capsule TAKE 1 CAPSULE(0.4 MG) BY MOUTH DAILY 90 capsule 3   vitamin C (ASCORBIC ACID) 500 MG tablet Take 500 mg by  mouth daily.     XARELTO 20 MG TABS tablet TAKE 1 TABLET(20 MG) BY MOUTH DAILY WITH SUPPER 90 tablet 3   zolpidem (AMBIEN CR) 12.5 MG CR tablet TAKE 1 TABLET(12.5 MG) BY MOUTH AT BEDTIME AS NEEDED FOR SLEEP (Patient taking differently: 12.5 mg at bedtime.) 30 tablet 5   No current facility-administered medications for this encounter.    Allergies  Allergen Reactions   Statins     Myalgias with elevated CPKs    Social History   Socioeconomic History   Marital status: Married    Spouse name: Not on file   Number of children: Not on file   Years of education: Not on file   Highest education level: Not on file  Occupational History   Not on file  Tobacco Use   Smoking status: Former    Packs/day: 2.00    Years: 30.00    Additional pack years: 0.00    Total pack years: 60.00    Types: Cigarettes    Quit date: 01/05/1992    Years since quitting: 31.0   Smokeless tobacco: Never   Tobacco comments:    Former smoker quit 19 yrs ago 11/22/22  Vaping Use   Vaping Use: Never used  Substance and Sexual Activity   Alcohol use: No   Drug use: Yes    Types: Marijuana   Sexual activity: Not on file  Other Topics Concern   Not on file  Social History Narrative   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   Social Determinants of Health   Financial Resource Strain: Low Risk  (04/30/2022)   Overall Financial Resource Strain (CARDIA)    Difficulty of Paying Living Expenses: Not hard at all  Food Insecurity: No Food Insecurity (04/30/2022)   Hunger Vital Sign    Worried About Running Out of Food in the Last Year: Never true    Ran Out of Food in the Last Year: Never true  Transportation Needs: No Transportation Needs (04/30/2022)   PRAPARE - Hydrologist (Medical): No    Lack of Transportation (Non-Medical): No  Physical Activity: Insufficiently Active (04/30/2022)   Exercise Vital Sign    Days of Exercise per Week: 3 days     Minutes of  Exercise per Session: 30 min  Stress: No Stress Concern Present (04/30/2022)   [place]    Feeling of Stress : Not at all  Social Connections: Moderately Isolated (04/30/2022)   Social Connection and Isolation Panel [NHANES]    Frequency of Communication with Friends and Family: Three times a week    Frequency of Social Gatherings with Friends and Family: Three times a week    Attends Religious Services: Never    Active Member of Clubs or Organizations: No    Attends Archivist Meetings: Never    Marital Status: Married  Human resources officer Violence: Not At Risk (04/30/2022)   Humiliation, Afraid, Rape, and Kick questionnaire    Fear of Current or Ex-Partner: No    Emotionally Abused: No    Physically Abused: No    Sexually Abused: No     ROS- All systems are reviewed and negative except as per the HPI above.  Physical Exam: Vitals:   12/30/22 0829  BP: (!) 142/76  Pulse: 63  Weight: 87.3 kg  Height: 5\' 10"  (1.778 m)     GEN- The patient is a well appearing male, alert and oriented x 3 today.   HEENT-head normocephalic, atraumatic, sclera clear, conjunctiva pink, hearing intact, trachea midline. Lungs- Clear to ausculation bilaterally, normal work of breathing Heart- irregular rate and rhythm, no murmurs, rubs or gallops  GI- soft, NT, ND, + BS Extremities- no clubbing, cyanosis, or edema MS- no significant deformity or atrophy Skin- no rash or lesion Psych- euthymic mood, full affect Neuro- strength and sensation are intact   Wt Readings from Last 3 Encounters:  12/30/22 87.3 kg  12/08/22 87.2 kg  11/30/22 83.9 kg    EKG today demonstrates  Afib Vent. rate 63 BPM PR interval * ms QRS duration 118 ms QT/QTcB 432/442 ms  Echo 01/21/20 demonstrated   1. Left ventricular ejection fraction, by estimation, is 55 to 60%. The  left ventricle has normal function. Left ventricular  endocardial border  not optimally defined to evaluate regional wall motion. There is mild left  ventricular hypertrophy. Left  ventricular diastolic parameters are consistent with Grade II diastolic  dysfunction (pseudonormalization).   2. Right ventricular systolic function is normal. The right ventricular  size is mildly enlarged. There is mildly elevated pulmonary artery  systolic pressure.   3. Left atrial size was moderately dilated.   4. Right atrial size was mildly dilated.   5. The mitral valve is normal in structure. Mild mitral valve  regurgitation.   6. The aortic valve is tricuspid. Aortic valve regurgitation is mild.  Mild aortic valve sclerosis is present, with no evidence of aortic valve  stenosis.   7. The inferior vena cava is normal in size with greater than 50%  respiratory variability, suggesting right atrial pressure of 3 mmHg.   Epic records are reviewed at length today  CHA2DS2-VASc Score = 3  The patient's score is based upon: CHF History: 0 HTN History: 1 Diabetes History: 0 Stroke History: 0 Vascular Disease History: 1 Age Score: 1 Gender Score: 0       ASSESSMENT AND PLAN: 1. Persistent Atrial Fibrillation (ICD10:  I48.19) The patient's CHA2DS2-VASc score is 3, indicating a 3.2% annual risk of stroke.   S/p afib ablation 03/19/20 with repeat ablation 10/25/22 Patient in persistent afib by ECG and Kardia mobile. Scheduled for DCCV on 01/04/23 Continue Eliquis 5 mg BID with no missed doses for 3 months  post ablation.  Kardia mobile for home monitoring.   2. Secondary Hypercoagulable State (ICD10:  D68.69) The patient is at significant risk for stroke/thromboembolism based upon his CHA2DS2-VASc Score of 3.  Continue Apixaban (Eliquis).   3. HTN Stable, no changes today.  4. Obstructive sleep apnea Inspire device placed 07/30/20 Followed by Dr Redmond Baseman and Dr Radford Pax Patient instructed not to use Inspire for 2 weeks post DCCV per previous device rep  instructions.   5. CAD S/p CABG. No anginal symptoms.    Follow up with Dr Curt Bears as scheduled.    [facility] [address] [place], [place] [address] [phone] 12/30/2022 10:14 AM

## 2022-12-30 NOTE — H&P (View-Only) (Signed)
Primary Care Physician: [name] Koch, MD Primary Cardiologist: Dr Johnsie Cancel Primary Electrophysiologist: Dr Curt Bears Referring Physician: Dr Tonny Bollman is a 72 y.o. male with a history of coronary artery disease status post three-vessel CABG, hypertension, OSA, hyperlipidemia, and atrial fibrillation.  He was found to be in atrial fibrillation incidentally on office visit 08/30/2017.  He was put on Eliquis at the time.  Beta-blockers were not started due to resting bradycardia.  January 2020 he was noted increasing dyspnea on exertion.  He had a cardioversion 11/28/18 and was started on amiodarone. He does report today that he was diagnosed with OSA a few years ago but could not tolerate CPAP therapy. He is on Eliquis for a CHADS2VASC score of 3. Patient is s/p afib ablation 03/19/20. He had recurrent persistent afib and is s/p DCCV on 01/13/21. He initially did well but went back into persistent afib and underwent repeat ablation with Dr Curt Bears on 10/25/22.  On follow up today, patient has been in persistent afib on his Kardia mobile device. Also in afib when he saw Dr Johnsie Cancel on 12/08/22. He is scheduled for DCCV on 01/04/23. No bleeding issues on anticoagulation.   Today, he denies symptoms of palpitations, chest pain, orthopnea, PND, lower extremity edema, dizziness, presyncope, syncope, bleeding, or neurologic sequela. The patient is tolerating medications without difficulties and is otherwise without complaint today.    Atrial Fibrillation Risk Factors:  he does have symptoms or diagnosis of sleep apnea. he has an Camera operator.  he does not have a history of rheumatic fever. he does not have a history of alcohol use. The patient does not have a history of early familial atrial fibrillation or other arrhythmias.  he has a BMI of Body mass index is 27.61 kg/m.Marland Kitchen Filed Weights   12/30/22 0829  Weight: 87.3 kg    Family History  Problem Relation Age of Onset   Heart  attack Mother    Heart disease Mother    Heart attack Father    Heart disease Father    Rectal cancer Neg Hx    Stomach cancer Neg Hx    Pancreatic cancer Neg Hx    Esophageal cancer Neg Hx    Colon polyps Neg Hx    Colon cancer Neg Hx      Atrial Fibrillation Management history:  Previous antiarrhythmic drugs: amiodarone Previous cardioversions: 11/2018, 01/13/21 Previous ablations: 03/19/20, 10/25/22 CHADS2VASC score: 3 Anticoagulation history: Eliquis   Past Medical History:  Diagnosis Date   Aortic aneurysm ([place]) Endograf 2009   Arthritis    Atrial fibrillation (HCC)    Bradycardia    CAD (coronary artery disease) CABG 03/2000   Median sternotomy for coronary artery bypass grafting x 3 (left   HTN (hypertension)    Hx of CABG 2001   severe 3 vessel disease   Hx of colonic polyp 06/2010   Hyperplastic    Hyperlipidemia    Myocardial infarction [facility])    Sleep apnea    Past Surgical History:  Procedure Laterality Date   ATRIAL FIBRILLATION ABLATION N/A 03/19/2020   Procedure: ATRIAL FIBRILLATION ABLATION;  Surgeon: Constance Haw, MD;  Location: [place] CV LAB;  Service: Cardiovascular;  Laterality: N/A;   ATRIAL FIBRILLATION ABLATION N/A 10/25/2022   Procedure: ATRIAL FIBRILLATION ABLATION;  Surgeon: Constance Haw, MD;  Location: [place] CV LAB;  Service: Cardiovascular;  Laterality: N/A;   CARDIOVERSION N/A 11/28/2018   Procedure: CARDIOVERSION;  Surgeon: Elouise Munroe,  MD;  Location: [place];  Service: Cardiovascular;  Laterality: N/A;   CARDIOVERSION N/A 01/13/2021   Procedure: CARDIOVERSION;  Surgeon: Pixie Casino, MD;  Location: [facility] ENDOSCOPY;  Service: Cardiovascular;  Laterality: N/A;   COLONOSCOPY     CORONARY ARTERY BYPASS GRAFT     AAA stent graft on 12/01/2007 at [facility]  coronary bypass in 2001. Dr. Roxy Manns.   DRUG INDUCED ENDOSCOPY Right 02/18/2020   Procedure: DRUG INDUCED ENDOSCOPY;  Surgeon: Melida Quitter,  MD;  Location: [place];  Service: ENT;  Laterality: Right;   FOOT SURGERY Left 07/19/2017   IMPLANTATION OF HYPOGLOSSAL NERVE STIMULATOR Right 07/30/2020   Procedure: IMPLANTATION OF HYPOGLOSSAL NERVE STIMULATOR Implantation of Chest wall respirator Sensor Electrode Electronic Analysis of Implanted Neurostimulator Pulse Generator;  Surgeon: Melida Quitter, MD;  Location: [place];  Service: ENT;  Laterality: Right;   INGUINAL HERNIA REPAIR Bilateral 08/06/2022   Procedure: LAPAROSCOPIC BILATERAL INGUINAL HERNIA REPAIR WITH MESH AND PRIMARY CLOSURE OF UMBILICAL HERNIA;  Surgeon: Felicie Morn, MD;  Location: [facility] ORS;  Service: General;  Laterality: Bilateral;   LEFT HEART CATHETERIZATION WITH CORONARY ANGIOGRAM N/A 10/26/2013   Procedure: LEFT HEART CATHETERIZATION WITH CORONARY ANGIOGRAM;  Surgeon: Peter M Martinique, MD;  Location: [facility] CATH LAB;  Service: Cardiovascular;  Laterality: N/A;   MASS EXCISION Right 08/10/2019   Procedure: EXCISION OF CYST FROM RIGHT SCALP;  Surgeon: Autumn Messing III, MD;  Location: [place];  Service: General;  Laterality: Right;    Current Outpatient Medications  Medication Sig Dispense Refill   acetaminophen (TYLENOL) 500 MG tablet Take 1,000 mg by mouth every 4 (four) hours as needed for moderate pain.     albuterol (VENTOLIN HFA) 108 (90 Base) MCG/ACT inhaler INHALE 2 PUFFS INTO THE LUNGS EVERY 6 HOURS AS NEEDED FOR WHEEZING OR SHORTNESS OF BREATH (Patient taking differently: Inhale 2 puffs into the lungs at bedtime.) 54 g 1   aspirin EC 81 MG EC tablet Take 1 tablet (81 mg total) by mouth daily.     Biotin 10000 MCG TABS Take 10,000 mcg by mouth in the morning.     cholecalciferol (VITAMIN D) 25 MCG (1000 UT) tablet Take 1,000 Units by mouth every evening.     cyclobenzaprine (FLEXERIL) 5 MG tablet Take 1 tablet (5 mg total) by mouth 3 (three) times daily as needed for muscle spasms. 30 tablet 1   diclofenac  sodium (VOLTAREN) 1 % GEL Apply 2 g topically 4 (four) times daily. (Patient taking differently: Apply 2 g topically 4 (four) times daily as needed (pain).) 100 g 3   divalproex (DEPAKOTE ER) 500 MG 24 hr tablet TAKE 1 TABLET(500 MG) BY MOUTH DAILY 90 tablet 1   doxycycline (VIBRAMYCIN) 50 MG capsule Take 50 mg by mouth every evening.     Evolocumab (REPATHA SURECLICK) [id number] MG/ML SOAJ ADMINISTER 1 ML UNDER THE SKIN EVERY 14 DAYS 6 mL 3   ezetimibe (ZETIA) 10 MG tablet Take 1 tablet (10 mg total) by mouth daily. 90 tablet 3   furosemide (LASIX) 20 MG tablet TAKE 1 TABLET(20 MG) BY MOUTH TWICE DAILY 180 tablet 3   gabapentin (NEURONTIN) 300 MG capsule TAKE 1 CAPSULE(300 MG) BY MOUTH TWICE DAILY 180 capsule 3   Glucosamine-Chondroitin (GLUCOSAMINE CHONDR COMPLEX PO) Take 500 mg by mouth 2 (two) times daily.      guaiFENesin (MUCINEX) 600 MG 12 hr tablet Take 600 mg by mouth 2 (two) times daily.  hydrALAZINE (APRESOLINE) 25 MG tablet TAKE 1 TABLET BY MOUTH EVERY MORNING AND AT BEDTIME 180 tablet 3   isosorbide mononitrate (IMDUR) 30 MG 24 hr tablet TAKE 1 TABLET BY MOUTH EVERY DAY 90 tablet 2   montelukast (SINGULAIR) 10 MG tablet TAKE 1 TABLET(10 MG) BY MOUTH AT BEDTIME 90 tablet 3   Multiple Vitamin (MULTIVITAMIN WITH MINERALS) TABS tablet Take 1 tablet by mouth every evening.     nitroGLYCERIN (NITROSTAT) 0.4 MG SL tablet DISSOLVE 1 TABLET UNDER THE TONGUE EVERY 5 MINUTES FOR 3 DOSES AS NEEDED FOR CHEST PAIN 25 tablet 4   Omega-3 Fatty Acids (FISH OIL) 1200 MG CAPS Take 1,200 mg by mouth 2 (two) times daily.     potassium chloride SA (KLOR-CON M) 20 MEQ tablet TAKE 1 TABLET BY MOUTH EVERY DAY 90 tablet 3   ramipril (ALTACE) 10 MG capsule TAKE 2 CAPSULES(20 MG) BY MOUTH DAILY (Patient taking differently: Take 20 mg by mouth at bedtime.) 180 capsule 2   tamsulosin (FLOMAX) 0.4 MG CAPS capsule TAKE 1 CAPSULE(0.4 MG) BY MOUTH DAILY 90 capsule 3   vitamin C (ASCORBIC ACID) 500 MG tablet Take 500 mg by  mouth daily.     XARELTO 20 MG TABS tablet TAKE 1 TABLET(20 MG) BY MOUTH DAILY WITH SUPPER 90 tablet 3   zolpidem (AMBIEN CR) 12.5 MG CR tablet TAKE 1 TABLET(12.5 MG) BY MOUTH AT BEDTIME AS NEEDED FOR SLEEP (Patient taking differently: 12.5 mg at bedtime.) 30 tablet 5   No current facility-administered medications for this encounter.    Allergies  Allergen Reactions   Statins     Myalgias with elevated CPKs    Social History   Socioeconomic History   Marital status: Married    Spouse name: Not on file   Number of children: Not on file   Years of education: Not on file   Highest education level: Not on file  Occupational History   Not on file  Tobacco Use   Smoking status: Former    Packs/day: 2.00    Years: 30.00    Additional pack years: 0.00    Total pack years: 60.00    Types: Cigarettes    Quit date: 01/05/1992    Years since quitting: 31.0   Smokeless tobacco: Never   Tobacco comments:    Former smoker quit 19 yrs ago 11/22/22  Vaping Use   Vaping Use: Never used  Substance and Sexual Activity   Alcohol use: No   Drug use: Yes    Types: Marijuana   Sexual activity: Not on file  Other Topics Concern   Not on file  Social History Narrative   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   Social Determinants of Health   Financial Resource Strain: Low Risk  (04/30/2022)   Overall Financial Resource Strain (CARDIA)    Difficulty of Paying Living Expenses: Not hard at all  Food Insecurity: No Food Insecurity (04/30/2022)   Hunger Vital Sign    Worried About Running Out of Food in the Last Year: Never true    Ran Out of Food in the Last Year: Never true  Transportation Needs: No Transportation Needs (04/30/2022)   PRAPARE - Hydrologist (Medical): No    Lack of Transportation (Non-Medical): No  Physical Activity: Insufficiently Active (04/30/2022)   Exercise Vital Sign    Days of Exercise per Week: 3 days     Minutes of  Exercise per Session: 30 min  Stress: No Stress Concern Present (04/30/2022)   [place]    Feeling of Stress : Not at all  Social Connections: Moderately Isolated (04/30/2022)   Social Connection and Isolation Panel [NHANES]    Frequency of Communication with Friends and Family: Three times a week    Frequency of Social Gatherings with Friends and Family: Three times a week    Attends Religious Services: Never    Active Member of Clubs or Organizations: No    Attends Archivist Meetings: Never    Marital Status: Married  Human resources officer Violence: Not At Risk (04/30/2022)   Humiliation, Afraid, Rape, and Kick questionnaire    Fear of Current or Ex-Partner: No    Emotionally Abused: No    Physically Abused: No    Sexually Abused: No     ROS- All systems are reviewed and negative except as per the HPI above.  Physical Exam: Vitals:   12/30/22 0829  BP: (!) 142/76  Pulse: 63  Weight: 87.3 kg  Height: 5\' 10"  (1.778 m)     GEN- The patient is a well appearing male, alert and oriented x 3 today.   HEENT-head normocephalic, atraumatic, sclera clear, conjunctiva pink, hearing intact, trachea midline. Lungs- Clear to ausculation bilaterally, normal work of breathing Heart- irregular rate and rhythm, no murmurs, rubs or gallops  GI- soft, NT, ND, + BS Extremities- no clubbing, cyanosis, or edema MS- no significant deformity or atrophy Skin- no rash or lesion Psych- euthymic mood, full affect Neuro- strength and sensation are intact   Wt Readings from Last 3 Encounters:  12/30/22 87.3 kg  12/08/22 87.2 kg  11/30/22 83.9 kg    EKG today demonstrates  Afib Vent. rate 63 BPM PR interval * ms QRS duration 118 ms QT/QTcB 432/442 ms  Echo 01/21/20 demonstrated   1. Left ventricular ejection fraction, by estimation, is 55 to 60%. The  left ventricle has normal function. Left ventricular  endocardial border  not optimally defined to evaluate regional wall motion. There is mild left  ventricular hypertrophy. Left  ventricular diastolic parameters are consistent with Grade II diastolic  dysfunction (pseudonormalization).   2. Right ventricular systolic function is normal. The right ventricular  size is mildly enlarged. There is mildly elevated pulmonary artery  systolic pressure.   3. Left atrial size was moderately dilated.   4. Right atrial size was mildly dilated.   5. The mitral valve is normal in structure. Mild mitral valve  regurgitation.   6. The aortic valve is tricuspid. Aortic valve regurgitation is mild.  Mild aortic valve sclerosis is present, with no evidence of aortic valve  stenosis.   7. The inferior vena cava is normal in size with greater than 50%  respiratory variability, suggesting right atrial pressure of 3 mmHg.   Epic records are reviewed at length today  CHA2DS2-VASc Score = 3  The patient's score is based upon: CHF History: 0 HTN History: 1 Diabetes History: 0 Stroke History: 0 Vascular Disease History: 1 Age Score: 1 Gender Score: 0       ASSESSMENT AND PLAN: 1. Persistent Atrial Fibrillation (ICD10:  I48.19) The patient's CHA2DS2-VASc score is 3, indicating a 3.2% annual risk of stroke.   S/p afib ablation 03/19/20 with repeat ablation 10/25/22 Patient in persistent afib by ECG and Kardia mobile. Scheduled for DCCV on 01/04/23 Continue Eliquis 5 mg BID with no missed doses for 3 months  post ablation.  Kardia mobile for home monitoring.   2. Secondary Hypercoagulable State (ICD10:  D68.69) The patient is at significant risk for stroke/thromboembolism based upon his CHA2DS2-VASc Score of 3.  Continue Apixaban (Eliquis).   3. HTN Stable, no changes today.  4. Obstructive sleep apnea Inspire device placed 07/30/20 Followed by Dr Redmond Baseman and Dr Radford Pax Patient instructed not to use Inspire for 2 weeks post DCCV per previous device rep  instructions.   5. CAD S/p CABG. No anginal symptoms.    Follow up with Dr Curt Bears as scheduled.    [facility] [address] [place], [place] [address] [phone] 12/30/2022 10:14 AM

## 2022-12-31 ENCOUNTER — Ambulatory Visit (HOSPITAL_COMMUNITY): Payer: PPO | Admitting: Physician Assistant

## 2022-12-31 ENCOUNTER — Other Ambulatory Visit: Payer: Self-pay

## 2022-12-31 MED ORDER — NITROGLYCERIN 0.4 MG SL SUBL: 0.4000 mg | SUBLINGUAL_TABLET | SUBLINGUAL | 11 refills | Status: DC | PRN

## 2023-01-03 NOTE — Anesthesia Preprocedure Evaluation (Signed)
Anesthesia Evaluation  Patient identified by MRN, date of birth, ID band Patient awake    Reviewed: Allergy & Precautions, NPO status , Patient's Chart, lab work & pertinent test results  History of Anesthesia Complications Negative for: history of anesthetic complications  Airway Mallampati: II  TM Distance: >3 FB Neck ROM: Full   Comment: Previous grade I view with Glidescope 4, mask with OPA Dental  (+) Dental Advisory Given, Edentulous Upper   Pulmonary neg shortness of breath, sleep apnea , neg COPD, neg recent URI, former smoker   Pulmonary exam normal breath sounds clear to auscultation       Cardiovascular hypertension (furosemide, hydralazine, ISMN, ramipril), Pt. on medications (-) angina + CAD, + Past MI and + CABG (2001)  + dysrhythmias (2nd degree AV block) Atrial Fibrillation  Rhythm:Irregular Rate:Normal  HLD, aortic aneurysm s/p repair  TTE 01/21/2020: IMPRESSIONS     1. Left ventricular ejection fraction, by estimation, is 55 to 60%. The  left ventricle has normal function. Left ventricular endocardial border  not optimally defined to evaluate regional wall motion. There is mild left  ventricular hypertrophy. Left  ventricular diastolic parameters are consistent with Grade II diastolic  dysfunction (pseudonormalization).   2. Right ventricular systolic function is normal. The right ventricular  size is mildly enlarged. There is mildly elevated pulmonary artery  systolic pressure.   3. Left atrial size was moderately dilated.   4. Right atrial size was mildly dilated.   5. The mitral valve is normal in structure. Mild mitral valve  regurgitation.   6. The aortic valve is tricuspid. Aortic valve regurgitation is mild.  Mild aortic valve sclerosis is present, with no evidence of aortic valve  stenosis.   7. The inferior vena cava is normal in size with greater than 50%  respiratory variability, suggesting  right atrial pressure of 3 mmHg.   Low-risk nuclear stress test 11/08/2018    Neuro/Psych  Headaches, neg Seizures    GI/Hepatic negative GI ROS, Neg liver ROS,,,  Endo/Other  negative endocrine ROS    Renal/GU negative Renal ROS     Musculoskeletal  (+) Arthritis ,    Abdominal   Peds  Hematology  (+) Blood dyscrasia, anemia   Anesthesia Other Findings On Xarelto  Reproductive/Obstetrics                             Anesthesia Physical Anesthesia Plan  ASA: 3  Anesthesia Plan: General   Post-op Pain Management:    Induction: Intravenous  PONV Risk Score and Plan: 2 and Treatment may vary due to age or medical condition  Airway Management Planned: Natural Airway and Mask  Additional Equipment:   Intra-op Plan:   Post-operative Plan:   Informed Consent: I have reviewed the patients History and Physical, chart, labs and discussed the procedure including the risks, benefits and alternatives for the proposed anesthesia with the patient or authorized representative who has indicated his/her understanding and acceptance.     Dental advisory given  Plan Discussed with: CRNA and Anesthesiologist  Anesthesia Plan Comments: (Risks of general anesthesia discussed including, but not limited to, sore throat, hoarse voice, chipped/damaged teeth, injury to vocal cords, nausea and vomiting, allergic reactions, lung infection, heart attack, stroke, and death. All questions answered. )       Anesthesia Quick Evaluation

## 2023-01-04 ENCOUNTER — Other Ambulatory Visit: Payer: Self-pay

## 2023-01-04 ENCOUNTER — Encounter (HOSPITAL_COMMUNITY): Payer: Self-pay | Admitting: Cardiology

## 2023-01-04 ENCOUNTER — Ambulatory Visit (HOSPITAL_BASED_OUTPATIENT_CLINIC_OR_DEPARTMENT_OTHER): Payer: PPO | Admitting: Anesthesiology

## 2023-01-04 ENCOUNTER — Ambulatory Visit (HOSPITAL_COMMUNITY): Payer: PPO | Admitting: Anesthesiology

## 2023-01-04 ENCOUNTER — Encounter (HOSPITAL_COMMUNITY)
Admission: RE | Disposition: A | Discharge status: Home / Self Care | Payer: Self-pay | Source: Ambulatory Visit | Attending: Cardiology

## 2023-01-04 ENCOUNTER — Ambulatory Visit (HOSPITAL_COMMUNITY)
Admission: RE | Admit: 2023-01-04 | Discharge: 2023-01-04 | Disposition: A | Discharge status: Home / Self Care | Payer: PPO | Source: Ambulatory Visit | Attending: Cardiology | Admitting: Cardiology

## 2023-01-04 DIAGNOSIS — Z7901 Long term (current) use of anticoagulants: Secondary | ICD-10-CM | POA: Insufficient documentation

## 2023-01-04 DIAGNOSIS — I10 Essential (primary) hypertension: Secondary | ICD-10-CM

## 2023-01-04 DIAGNOSIS — I2510 Atherosclerotic heart disease of native coronary artery without angina pectoris: Secondary | ICD-10-CM

## 2023-01-04 DIAGNOSIS — Z87891 Personal history of nicotine dependence: Secondary | ICD-10-CM

## 2023-01-04 DIAGNOSIS — M1990 Unspecified osteoarthritis, unspecified site: Secondary | ICD-10-CM | POA: Diagnosis not present

## 2023-01-04 DIAGNOSIS — D6869 Other thrombophilia: Secondary | ICD-10-CM | POA: Insufficient documentation

## 2023-01-04 DIAGNOSIS — I498 Other specified cardiac arrhythmias: Secondary | ICD-10-CM | POA: Insufficient documentation

## 2023-01-04 DIAGNOSIS — I252 Old myocardial infarction: Secondary | ICD-10-CM | POA: Insufficient documentation

## 2023-01-04 DIAGNOSIS — I4891 Unspecified atrial fibrillation: Secondary | ICD-10-CM

## 2023-01-04 DIAGNOSIS — Z951 Presence of aortocoronary bypass graft: Secondary | ICD-10-CM | POA: Insufficient documentation

## 2023-01-04 DIAGNOSIS — Z79899 Other long term (current) drug therapy: Secondary | ICD-10-CM | POA: Diagnosis not present

## 2023-01-04 DIAGNOSIS — I440 Atrioventricular block, first degree: Secondary | ICD-10-CM | POA: Diagnosis not present

## 2023-01-04 DIAGNOSIS — I4430 Unspecified atrioventricular block: Secondary | ICD-10-CM | POA: Diagnosis not present

## 2023-01-04 DIAGNOSIS — I4819 Other persistent atrial fibrillation: Secondary | ICD-10-CM | POA: Insufficient documentation

## 2023-01-04 DIAGNOSIS — E785 Hyperlipidemia, unspecified: Secondary | ICD-10-CM | POA: Insufficient documentation

## 2023-01-04 DIAGNOSIS — G4733 Obstructive sleep apnea (adult) (pediatric): Secondary | ICD-10-CM | POA: Diagnosis not present

## 2023-01-04 DIAGNOSIS — I080 Rheumatic disorders of both mitral and aortic valves: Secondary | ICD-10-CM | POA: Insufficient documentation

## 2023-01-04 HISTORY — PX: CARDIOVERSION: SHX1299

## 2023-01-04 SURGERY — CARDIOVERSION
Anesthesia: General

## 2023-01-04 MED ORDER — SODIUM CHLORIDE 0.9 % IV SOLN: INTRAVENOUS | Status: DC

## 2023-01-04 MED ORDER — LIDOCAINE 2% (20 MG/ML) 5 ML SYRINGE
INTRAMUSCULAR | Status: DC | PRN
  Administered 2023-01-04: 40 mg via INTRAVENOUS

## 2023-01-04 MED ORDER — PROPOFOL 10 MG/ML IV BOLUS
INTRAVENOUS | Status: DC | PRN
  Administered 2023-01-04: 60 mg via INTRAVENOUS

## 2023-01-04 NOTE — CV Procedure (Signed)
Procedure: Electrical Cardioversion Indications:  Atrial Fibrillation  Procedure Details:  Consent: Risks of procedure as well as the alternatives and risks of each were explained to the (patient/caregiver).  Consent for procedure obtained.  Time Out: Verified patient identification, verified procedure, site/side was marked, verified correct patient position, special equipment/implants available, medications/allergies/relevent history reviewed, required imaging and test results available. PERFORMED.  Patient placed on cardiac monitor, pulse oximetry, supplemental oxygen as necessary.  Sedation given:  Propofol 60mg ; lidocaine 40mg  Pacer pads placed anterior and posterior chest.  Cardioverted 1 time(s).  Cardioversion with synchronized biphasic 150J shock.  Evaluation: Findings: Post procedure EKG shows:  Sinus bradycardia Complications: None Patient did tolerate procedure well.  Time Spent Directly with the Patient:  49minutes   [name] 01/04/2023, 8:32 AM

## 2023-01-04 NOTE — Transfer of Care (Signed)
Immediate Anesthesia Transfer of Care Note  Patient: [name] Brown  Procedure(s) Performed: CARDIOVERSION  Patient Location: Endoscopy Unit  Anesthesia Type:General  Level of Consciousness: awake, alert , and oriented  Airway & Oxygen Therapy: Patient Spontanous Breathing  Post-op Assessment: Report given to RN, Post -op Vital signs reviewed and stable, and Patient moving all extremities  Post vital signs: Reviewed and stable  Last Vitals:  Vitals Value Taken Time  BP 124/72 01/04/23 0832  Temp    Pulse 71 01/04/23 0833  Resp 19 01/04/23 0833  SpO2 93 % 01/04/23 0833  Vitals shown include unvalidated device data.  Last Pain:  Vitals:   01/04/23 0750  TempSrc: Temporal  PainSc: 0-No pain         Complications: No notable events documented.

## 2023-01-04 NOTE — Interval H&P Note (Signed)
History and Physical Interval Note:  01/04/2023 7:40 AM  [name] Brown  has presented today for surgery, with the diagnosis of afib.  The various methods of treatment have been discussed with the patient and family. After consideration of risks, benefits and other options for treatment, the patient has consented to  Procedure(s): CARDIOVERSION (N/A) as a surgical intervention.  The patient's history has been reviewed, patient examined, no change in status, stable for surgery.  I have reviewed the patient's chart and labs.  Questions were answered to the patient's satisfaction.     [name]

## 2023-01-04 NOTE — Discharge Instructions (Signed)

## 2023-01-04 NOTE — Anesthesia Postprocedure Evaluation (Signed)
Anesthesia Post Note  Patient: [name] Brown  Procedure(s) Performed: CARDIOVERSION     Patient location during evaluation: PACU Anesthesia Type: General Level of consciousness: awake Pain management: pain level controlled Vital Signs Assessment: post-procedure vital signs reviewed and stable Respiratory status: spontaneous breathing, nonlabored ventilation and respiratory function stable Cardiovascular status: blood pressure returned to baseline and stable Postop Assessment: no apparent nausea or vomiting Anesthetic complications: no   No notable events documented.  Last Vitals:  Vitals:   01/04/23 0840 01/04/23 0850  BP: 121/75 111/76  Pulse: 69 63  Resp: 12 15  Temp:    SpO2: 92% 94%    Last Pain:  Vitals:   01/04/23 0835  TempSrc:   PainSc: 0-No pain                 Nilda Simmer

## 2023-01-04 NOTE — Anesthesia Procedure Notes (Signed)
Procedure Name: General with mask airway Date/Time: 01/04/2023 8:30 AM  Performed by: Leonor Liv, CRNAPre-anesthesia Checklist: Patient identified, Emergency Drugs available, Suction available, Patient being monitored and Timeout performed Patient Re-evaluated:Patient Re-evaluated prior to induction Oxygen Delivery Method: Ambu bag Placement Confirmation: positive ETCO2 Dental Injury: Teeth and Oropharynx as per pre-operative assessment

## 2023-01-06 ENCOUNTER — Encounter (HOSPITAL_COMMUNITY): Payer: Self-pay | Admitting: Cardiology

## 2023-01-19 ENCOUNTER — Ambulatory Visit (HOSPITAL_COMMUNITY)
Admission: RE | Admit: 2023-01-19 | Discharge: 2023-01-19 | Disposition: A | Discharge status: Home / Self Care | Payer: PPO | Source: Ambulatory Visit | Attending: Cardiovascular Disease | Admitting: Cardiovascular Disease

## 2023-01-19 ENCOUNTER — Telehealth: Payer: Self-pay | Admitting: Cardiovascular Disease

## 2023-01-19 DIAGNOSIS — R0989 Other specified symptoms and signs involving the circulatory and respiratory systems: Secondary | ICD-10-CM

## 2023-01-19 DIAGNOSIS — I6522 Occlusion and stenosis of left carotid artery: Secondary | ICD-10-CM

## 2023-01-19 NOTE — Telephone Encounter (Signed)
Order placed at this time for repeat Carotid US in October 2024. Pt aware of results.

## 2023-01-19 NOTE — Telephone Encounter (Signed)
-----   Message from Wendall Stade, MD sent at 01/19/2023 11:07 AM EDT ----- Left ICA 60-79% stenosis.  No TIA symptoms.  Continue antiplatelet Rx and F/U carotid duplex in 6 months

## 2023-01-20 ENCOUNTER — Other Ambulatory Visit: Payer: Self-pay | Admitting: Cardiovascular Disease

## 2023-01-20 ENCOUNTER — Other Ambulatory Visit: Payer: Self-pay | Admitting: Internal Medicine

## 2023-01-20 DIAGNOSIS — G44009 Cluster headache syndrome, unspecified, not intractable: Secondary | ICD-10-CM

## 2023-01-25 ENCOUNTER — Encounter: Payer: Self-pay | Admitting: Cardiology

## 2023-01-25 ENCOUNTER — Ambulatory Visit: Payer: PPO | Attending: Cardiology | Admitting: Cardiology

## 2023-01-25 VITALS — BP 140/82 | HR 58 | Ht 70.0 in | Wt 191.0 lb

## 2023-01-25 DIAGNOSIS — D6869 Other thrombophilia: Secondary | ICD-10-CM | POA: Diagnosis not present

## 2023-01-25 DIAGNOSIS — I2510 Atherosclerotic heart disease of native coronary artery without angina pectoris: Secondary | ICD-10-CM | POA: Diagnosis not present

## 2023-01-25 DIAGNOSIS — I4819 Other persistent atrial fibrillation: Secondary | ICD-10-CM

## 2023-01-25 DIAGNOSIS — I10 Essential (primary) hypertension: Secondary | ICD-10-CM

## 2023-01-25 MED ORDER — DRONEDARONE HCL 400 MG PO TABS: 400.0000 mg | ORAL_TABLET | Freq: Two times a day (BID) | ORAL | 3 refills | Status: DC

## 2023-01-25 NOTE — Progress Notes (Addendum)
Electrophysiology Office Note   Date:  01/25/2023   ID:  ANTRONE WALLA, DOB 10-03-1951, MRN [id number]  PCP:  Myrlene Broker, MD  Cardiologist:  Eden Emms Primary Electrophysiologist:  Deneise Getty Jorja Loa, MD    No chief complaint on file.     History of Present Illness: [name] Brown is a 72 y.o. male who is being seen today for the evaluation of atrial fibrillation at the request of Myrlene Broker, *. Presenting today for electrophysiology evaluation.    He has a history significant for coronary artery disease post three-vessel CABG, hypertension, hyperlipidemia, atrial fibrillation.  He is post ablation 03/19/2020.  He had more frequent episodes of atrial fibrillation and is now status post repeat ablation 10/25/2022.  He did go back into atrial fibrillation and had a cardioversion 01/04/2023.  Today, denies symptoms of palpitations, chest pain, shortness of breath, orthopnea, PND, lower extremity edema, claudication, dizziness, presyncope, syncope, bleeding, or neurologic sequela. The patient is tolerating medications without difficulties.  Since his cardioversion, he is continued to have short episodes of atrial fibrillation.  He has not been persistent.  He brings in cardia mobile recordings that show episodes of atrial fibrillation where he feels weak, fatigued, short of breath.  Past Medical History:  Diagnosis Date   Aortic aneurysm Endograf 2009   Arthritis    Atrial fibrillation    Bradycardia    CAD (coronary artery disease) CABG 03/2000   Median sternotomy for coronary artery bypass grafting x 3 (left   HTN (hypertension)    Hx of CABG 2001   severe 3 vessel disease   Hx of colonic polyp 06/2010   Hyperplastic    Hyperlipidemia    Myocardial infarction    Sleep apnea    Past Surgical History:  Procedure Laterality Date   ATRIAL FIBRILLATION ABLATION N/A 03/19/2020   Procedure: ATRIAL FIBRILLATION ABLATION;  Surgeon: Regan Lemming, MD;  Location:  [facility];  Service: Cardiovascular;  Laterality: N/A;   ATRIAL FIBRILLATION ABLATION N/A 10/25/2022   Procedure: ATRIAL FIBRILLATION ABLATION;  Surgeon: Regan Lemming, MD;  Location: [facility];  Service: Cardiovascular;  Laterality: N/A;   CARDIOVERSION N/A 11/28/2018   Procedure: CARDIOVERSION;  Surgeon: Parke Poisson, MD;  Location[facility] ENDOSCOPY;  Service: Cardiovascular;  Laterality: N/A;   CARDIOVERSION N/A 01/13/2021   Procedure: CARDIOVERSION;  Surgeon: Chrystie Nose, MD;  Location: [facility] ENDOSCOPY;  Service: Cardiovascular;  Laterality: N/A;   CARDIOVERSION N/A 01/04/2023   Procedure: CARDIOVERSION;  Surgeon: Meriam Sprague, MD;  Location: [facility] ENDOSCOPY;  Service: Cardiovascular;  Laterality: N/A;   COLONOSCOPY     CORONARY ARTERY BYPASS GRAFT     AAA stent graft on 12/01/2007 at [facility]  coronary bypass in 2001. Dr. Cornelius Moras.   DRUG INDUCED ENDOSCOPY Right 02/18/2020   Procedure: DRUG INDUCED ENDOSCOPY;  Surgeon: Christia Reading, MD;  Location: [facility];  Service: ENT;  Laterality: Right;   FOOT SURGERY Left 07/19/2017   IMPLANTATION OF HYPOGLOSSAL NERVE STIMULATOR Right 07/30/2020   Procedure: IMPLANTATION OF HYPOGLOSSAL NERVE STIMULATOR Implantation of Chest wall respirator Sensor Electrode Electronic Analysis of Implanted Neurostimulator Pulse Generator;  Surgeon: Christia Reading, MD;  Location: [facility];  Service: ENT;  Laterality: Right;   INGUINAL HERNIA REPAIR Bilateral 08/06/2022   Procedure: LAPAROSCOPIC BILATERAL INGUINAL HERNIA REPAIR WITH MESH AND PRIMARY CLOSURE OF UMBILICAL HERNIA;  Surgeon: Quentin Ore, MD;  Location: [facility] ORS;  Service:  General;  Laterality: Bilateral;   LEFT HEART CATHETERIZATION WITH CORONARY ANGIOGRAM N/A 10/26/2013   Procedure: LEFT HEART CATHETERIZATION WITH CORONARY ANGIOGRAM;  Surgeon: Peter M Swaziland, MD;  Location: [facility] CATH LAB;  Service: Cardiovascular;   Laterality: N/A;   MASS EXCISION Right 08/10/2019   Procedure: EXCISION OF CYST FROM RIGHT SCALP;  Surgeon: Chevis Pretty III, MD;  Location: [facility];  Service: General;  Laterality: Right;     Current Outpatient Medications  Medication Sig Dispense Refill   acetaminophen (TYLENOL) 500 MG tablet Take 1,000 mg by mouth every 4 (four) hours as needed for moderate pain.     albuterol (VENTOLIN HFA) 108 (90 Base) MCG/ACT inhaler INHALE 2 PUFFS INTO THE LUNGS EVERY 6 HOURS AS NEEDED FOR WHEEZING OR SHORTNESS OF BREATH (Patient taking differently: Inhale 2 puffs into the lungs See admin instructions. Inhale 2 puffs at bedtime, may inhale 2 puffs every 6 hours as needed for shortness of breath) 54 g 1   aspirin EC 81 MG EC tablet Take 1 tablet (81 mg total) by mouth daily.     Biotin [address] MCG TABS Take 10,000 mcg by mouth in the morning.     cholecalciferol (VITAMIN D) 25 MCG (1000 UT) tablet Take 1,000 Units by mouth every evening.     cyclobenzaprine (FLEXERIL) 5 MG tablet Take 1 tablet (5 mg total) by mouth 3 (three) times daily as needed for muscle spasms. 30 tablet 1   diclofenac sodium (VOLTAREN) 1 % GEL Apply 2 g topically 4 (four) times daily. (Patient taking differently: Apply 2 g topically 4 (four) times daily as needed (pain).) 100 g 3   divalproex (DEPAKOTE ER) 500 MG 24 hr tablet TAKE 1 TABLET(500 MG) BY MOUTH DAILY 90 tablet 1   doxycycline (MONODOX) 100 MG capsule Take 100 mg by mouth daily.     dronedarone (MULTAQ) 400 MG tablet Take 1 tablet (400 mg total) by mouth 2 (two) times daily with a meal. 60 tablet 3   Evolocumab (REPATHA SURECLICK) 140 MG/ML SOAJ ADMINISTER 1 ML UNDER THE SKIN EVERY 14 DAYS 6 mL 3   ezetimibe (ZETIA) 10 MG tablet TAKE 1 TABLET(10 MG) BY MOUTH DAILY 90 tablet 3   furosemide (LASIX) 20 MG tablet TAKE 1 TABLET(20 MG) BY MOUTH TWICE DAILY 180 tablet 3   gabapentin (NEURONTIN) 300 MG capsule TAKE 1 CAPSULE(300 MG) BY MOUTH TWICE DAILY 180 capsule 3    Glucosamine-Chondroitin (GLUCOSAMINE CHONDR COMPLEX PO) Take 500 mg by mouth 2 (two) times daily.      guaiFENesin (MUCINEX) 600 MG 12 hr tablet Take 600 mg by mouth 2 (two) times daily.     hydrALAZINE (APRESOLINE) 25 MG tablet TAKE 1 TABLET BY MOUTH EVERY MORNING AND AT BEDTIME 180 tablet 3   isosorbide mononitrate (IMDUR) 30 MG 24 hr tablet TAKE 1 TABLET BY MOUTH EVERY DAY 90 tablet 3   montelukast (SINGULAIR) 10 MG tablet TAKE 1 TABLET(10 MG) BY MOUTH AT BEDTIME 90 tablet 3   Multiple Vitamin (MULTIVITAMIN WITH MINERALS) TABS tablet Take 1 tablet by mouth every evening.     nitroGLYCERIN (NITROSTAT) 0.4 MG SL tablet Place 1 tablet (0.4 mg total) under the tongue every 5 (five) minutes as needed for chest pain. 25 tablet 11   Omega-3 Fatty Acids (FISH OIL) 1200 MG CAPS Take 1,200 mg by mouth 2 (two) times daily.     potassium chloride SA (KLOR-CON M) 20 MEQ tablet TAKE 1 TABLET BY MOUTH EVERY DAY 90  tablet 3   ramipril (ALTACE) 10 MG capsule TAKE 2 CAPSULES(20 MG) BY MOUTH DAILY (Patient taking differently: Take 20 mg by mouth at bedtime.) 180 capsule 2   tamsulosin (FLOMAX) 0.4 MG CAPS capsule TAKE 1 CAPSULE(0.4 MG) BY MOUTH DAILY 90 capsule 3   Tetrahydrozoline HCl (VISINE OP) Place 1 drop into both eyes daily as needed (irritation).     vitamin C (ASCORBIC ACID) 500 MG tablet Take 500 mg by mouth daily.     XARELTO 20 MG TABS tablet TAKE 1 TABLET(20 MG) BY MOUTH DAILY WITH SUPPER 90 tablet 3   zolpidem (AMBIEN CR) 12.5 MG CR tablet TAKE 1 TABLET(12.5 MG) BY MOUTH AT BEDTIME AS NEEDED FOR SLEEP (Patient taking differently: 12.5 mg at bedtime.) 30 tablet 5   No current facility-administered medications for this visit.    Allergies:   Statins   Social History:  The patient  reports that he quit smoking about 31 years ago. His smoking use included cigarettes. He has a 60.00 pack-year smoking history. He has never used smokeless tobacco. He reports current drug use. Drug: Marijuana. He  reports that he does not drink alcohol.   Family History:  The patient's family history includes Heart attack in his father and mother; Heart disease in his father and mother.   ROS:  Please see the history of present illness.   Otherwise, review of systems is positive for none.   All other systems are reviewed and negative.   PHYSICAL EXAM: VS:  BP (!) 140/82   Pulse (!) 58   Ht 5\' 10"  (1.778 m)   Wt 191 lb (86.6 kg)   SpO2 96%   BMI 27.41 kg/m  , BMI Body mass index is 27.41 kg/m. GEN: Well nourished, well developed, in no acute distress  HEENT: normal  Neck: no JVD, carotid bruits, or masses Cardiac: RRR; no murmurs, rubs, or gallops,no edema  Respiratory:  clear to auscultation bilaterally, normal work of breathing GI: soft, nontender, nondistended, + BS MS: no deformity or atrophy  Skin: warm and dry Neuro:  Strength and sensation are intact Psych: euthymic mood, full affect  EKG:  EKG is ordered today. Personal review of the ekg ordered shows sinus rhythm, PVC, rate 58  Recent Labs: 12/30/2022: BUN 25; Creatinine, Ser 1.20; Hemoglobin 14.1; Platelets 186; Potassium 4.5; Sodium 140    Lipid Panel     Component Value Date/Time   CHOL 109 02/04/2021 0747   TRIG 78 02/04/2021 0747   HDL 34 (L) 02/04/2021 0747   CHOLHDL 3.2 02/04/2021 0747   CHOLHDL 4.8 11/29/2018 0223   VLDL 6 11/29/2018 0223   LDLCALC 59 02/04/2021 0747     Wt Readings from Last 3 Encounters:  01/25/23 191 lb (86.6 kg)  01/04/23 190 lb (86.2 kg)  12/30/22 192 lb 6.4 oz (87.3 kg)      Other studies Reviewed: Additional studies/ records that were reviewed today include: TTE 01/21/20 Review of the above records today demonstrates:   1. Left ventricular ejection fraction, by estimation, is 55 to 60%. The  left ventricle has normal function. Left ventricular endocardial border  not optimally defined to evaluate regional wall motion. There is mild left  ventricular hypertrophy. Left  ventricular  diastolic parameters are consistent with Grade II diastolic  dysfunction (pseudonormalization).   2. Right ventricular systolic function is normal. The right ventricular  size is mildly enlarged. There is mildly elevated pulmonary artery  systolic pressure.   3. Left atrial size was  moderately dilated.   4. Right atrial size was mildly dilated.   5. The mitral valve is normal in structure. Mild mitral valve  regurgitation.   6. The aortic valve is tricuspid. Aortic valve regurgitation is mild.  Mild aortic valve sclerosis is present, with no evidence of aortic valve  stenosis.   7. The inferior vena cava is normal in size with greater than 50%  respiratory variability, suggesting right atrial pressure of 3 mmHg.   SPECT 11/08/18 The left ventricular ejection fraction is normal (55-65%). Nuclear stress EF: 56%. Post CABG septal wall hypokinesis. There was no ST segment deviation noted during stress. There is mildly reduced radiotracer uptake at both rest and stress in the anterior, lateral and inferolateral distributions. No ischemia identified. (POST CABG) This is a low risk study.  ASSESSMENT AND PLAN:  1.  Persistent atrial fibrillation: Currently on Eliquis.  CHA2DS2-VASc of 3.  Status post ablation 03/19/2020 with repeat ablation 10/25/2022.  He is continued to have short episodes of atrial fibrillation making him feel quite poorly.  He would benefit from a further rhythm control strategy.  He has coronary artery disease with a normal ejection fraction.  Yuniel Blaney start Multaq.  Additionally, he would benefit from ILR for further monitoring.  He fits the criteria for the alleviate study.  Kiondre Grenz alert the research team and have him scheduled.  When he is in atrial fibrillation, he complains of shortness of breath, at the level of NYHA class II.  2.  Coronary artery disease: Status post CABG.  Occluded radial to the OM.  No current chest pain.  3.  Hypertension: Currently well-controlled  4.   Hyperlipidemia: Continue Repatha per lipid clinic  5.  Obstructive sleep apnea: Treated with neurostimulator.  Undergoing therapy per sleep medicine.  6.  Secondary hypercoagulable state: Currently on Eliquis for atrial fibrillation   Current medicines are reviewed at length with the patient today.   The patient does not have concerns regarding his medicines.  The following changes were made today: None  Labs/ tests ordered today include:  Orders Placed This Encounter  Procedures   EKG 12-Lead     Disposition:   FU 3 months  Signed, Sophiamarie Nease Jorja Loa, MD  01/25/2023 12:01 PM     [facility] [address] [place] [address] [phone] (office) [phone] (fax)

## 2023-01-25 NOTE — Patient Instructions (Addendum)
Medication Instructions:  Your physician has recommended you make the following change in your medication:  START Multaq 400 mg twice daily  *If you need a refill on your cardiac medications before your next appointment, please call your pharmacy*   Lab Work: None ordered If you have labs (blood work) drawn today and your tests are completely normal, you will receive your results only by: MyChart Message (if you have MyChart) OR A paper copy in the mail If you have any lab test that is abnormal or we need to change your treatment, we will call you to review the results.   Testing/Procedures: We will contact research for the Alleviate study   Follow-Up: At [facility] you and your health needs are our priority.  As part of our continuing mission to provide you with exceptional heart care, we have created designated Provider Care Teams.  These Care Teams include your primary Cardiologist (physician) and Advanced Practice Providers (APPs -  Physician Assistants and Nurse Practitioners) who all work together to provide you with the care you need, when you need it.   Your next appointment:   To be   determined  The format for your next appointment:   In Person  Provider:   Loman Brooklyn, MD    Thank you for choosing [facility]!!   Dory Horn, RN [phone]  Other Instructions  Dronedarone Tablets What is this medication? DRONEDARONE (droe NE da rone) treats a fast or irregular heartbeat (arrhythmia). It works by slowing down overactive electric signals in the heart, which stabilizes your heart rhythm. It belongs to a group of medications called antiarrhythmics. This medicine may be used for other purposes; ask your health care provider or pharmacist if you have questions. COMMON BRAND NAME(S): Multaq What should I tell my care team before I take this medication? They need to know if you have any of these conditions: Heart failure History of stroke Irregular  heartbeat or rhythm other than atrial fibrillation (AFib) Liver disease Lung disease Low levels of magnesium in the blood Low levels of potassium in the blood Other heart conditions, such as heart block Pacemaker Permanent atrial fibrillation (AFib) Slow heartbeat An unusual or allergic reaction to dronedarone, other medications, foods, dyes, or preservatives Pregnant or trying to get pregnant Breastfeeding How should I use this medication? Take this medication by mouth with a glass of water. Follow the directions on the prescription label. Take one tablet with the morning meal and one tablet with the evening meal. Do not take your medication more often than directed. Do not stop taking except on the advice of your care team. A special MedGuide will be given to you by the pharmacist with each prescription and refill. Be sure to read this information carefully each time. Talk to your care team about the use of this medication in children. Special care may be needed. Overdosage: If you think you have taken too much of this medicine contact a poison control center or emergency room at once. NOTE: This medicine is only for you. Do not share this medicine with others. What if I miss a dose? If you miss a dose, take it as soon as you can. If it is almost time for your next dose, take only that dose. Do not take double or extra doses. What may interact with this medication? Do not take this medication with any of the following: Adagrasib Arsenic trioxide Certain antibiotics, such as clarithromycin, erythromycin, pentamidine, telithromycin, troleandomycin Certain medications for depression,  such as tricyclic antidepressants Certain medications for fungal infections, such as fluconazole, itraconazole, ketoconazole, posaconazole, voriconazole Certain medications for irregular heart beat, such as amiodarone, disopyramide, flecainide, ibutilide, quinidine, propafenone, sotalol Certain medications for  malaria, such as chloroquine, halofantrine Cisapride Cyclosporine Droperidol Haloperidol Methadone Nefazodone Other medications that cause heart rhythm changes, such as degarelix, encorafenib, entrectinib, eribulin, goserelin, lapatinib Phenothiazines, such as chlorpromazine, mesoridazine, prochlorperazine, thioridazine Pimozide Ritonavir Ziprasidone This medication may also interact with the following: Certain medications for blood pressure, heart disease, or irregular heart beat, such as diltiazem, metoprolol, propranolol, verapamil Certain medications for cholesterol, such as atorvastatin, lovastatin, simvastatin Certain medications for seizures, such as carbamazepine, phenobarbital, phenytoin Dabigatran Digoxin Dofetilide Grapefruit juice Rifampin Sirolimus St. John's wort Tacrolimus Warfarin This list may not describe all possible interactions. Give your health care provider a list of all the medicines, herbs, non-prescription drugs, or dietary supplements you use. Also tell them if you smoke, drink alcohol, or use illegal drugs. Some items may interact with your medicine. What should I watch for while using this medication? Visit your care team for regular checks on your progress. Tell your care team if your symptoms do not start to get better or if they get worse. This medication may affect your coordination, reaction time, or judgment. Do not drive or operate machinery until you know how this medication affects you. Sit up or stand slowly to reduce the risk of dizzy or fainting spells. Talk to your care team if you may be pregnant. Serious birth defects can occur if you take this medication during pregnancy and for 5 days after the last dose. You will need a negative pregnancy test before starting this medication. Contraception is recommended while taking this medication and for 5 days after the last dose. You care team can help you find the option that works for you. Do not  breastfeed while taking this medication and for 5 days after the last dose. What side effects may I notice from receiving this medication? Side effects that you should report to your care team as soon as possible: Allergic reactions--skin rash, itching, hives, swelling of the face, lips, tongue, or throat Slow heartbeat--dizziness, feeling faint or lightheaded, confusion, trouble breathing, unusual weakness or fatigue Heart failure--shortness of breath, swelling of the ankles, feet, or hands, sudden weight gain, unusual weakness or fatigue Heart rhythm changes--fast or irregular heartbeat, dizziness, feeling faint or lightheaded, chest pain, trouble breathing Kidney injury--decrease in the amount of urine, swelling of the ankles, hands, or feet Liver injury--right upper belly pain, loss of appetite, nausea, light-colored stool, dark yellow or brown urine, yellowing skin or eyes, unusual weakness or fatigue Lung injury--shortness of breath or trouble breathing, cough, spitting up blood, chest pain, fever Side effects that usually do not require medical attention (report to your care team if they continue or are bothersome): Diarrhea Nausea Stomach pain Vomiting This list may not describe all possible side effects. Call your doctor for medical advice about side effects. You may report side effects to FDA at 1-800-FDA-1088. Where should I keep my medication? Keep out of the reach of children. Store at room temperature between 15 and 30 degrees C (59 and 86 degrees F). Throw away any unused medication after the expiration date. NOTE: This sheet is a summary. It may not cover all possible information. If you have questions about this medicine, talk to your doctor, pharmacist, or health care provider.  2023 Elsevier/Gold Standard (2021-04-07 00:00:00)

## 2023-02-01 ENCOUNTER — Other Ambulatory Visit: Payer: Self-pay | Admitting: *Deleted

## 2023-02-01 DIAGNOSIS — I4819 Other persistent atrial fibrillation: Secondary | ICD-10-CM

## 2023-02-01 DIAGNOSIS — I2510 Atherosclerotic heart disease of native coronary artery without angina pectoris: Secondary | ICD-10-CM

## 2023-02-03 ENCOUNTER — Ambulatory Visit: Payer: PPO | Attending: Cardiology

## 2023-02-03 DIAGNOSIS — I4819 Other persistent atrial fibrillation: Secondary | ICD-10-CM

## 2023-02-03 DIAGNOSIS — I2510 Atherosclerotic heart disease of native coronary artery without angina pectoris: Secondary | ICD-10-CM

## 2023-02-05 LAB — PRO B NATRIURETIC PEPTIDE: NT-Pro BNP: 636 pg/mL — ABNORMAL HIGH (ref 0–376)

## 2023-02-14 ENCOUNTER — Ambulatory Visit: Payer: PPO | Attending: Cardiology | Admitting: Cardiology

## 2023-02-14 ENCOUNTER — Encounter: Payer: Self-pay | Admitting: Cardiology

## 2023-02-14 VITALS — BP 140/70 | HR 36 | Resp 14 | Wt 192.0 lb

## 2023-02-14 VITALS — BP 140/70 | HR 36 | Ht 70.0 in | Wt 192.0 lb

## 2023-02-14 DIAGNOSIS — Z006 Encounter for examination for normal comparison and control in clinical research program: Secondary | ICD-10-CM

## 2023-02-14 DIAGNOSIS — I2510 Atherosclerotic heart disease of native coronary artery without angina pectoris: Secondary | ICD-10-CM | POA: Diagnosis not present

## 2023-02-14 DIAGNOSIS — I4819 Other persistent atrial fibrillation: Secondary | ICD-10-CM | POA: Diagnosis not present

## 2023-02-14 DIAGNOSIS — D6869 Other thrombophilia: Secondary | ICD-10-CM | POA: Diagnosis not present

## 2023-02-14 DIAGNOSIS — I10 Essential (primary) hypertension: Secondary | ICD-10-CM | POA: Diagnosis not present

## 2023-02-14 DIAGNOSIS — I483 Typical atrial flutter: Secondary | ICD-10-CM

## 2023-02-14 NOTE — Research (Signed)
Reviewed of history and physical by investigator.   INCLUSION [x]  []  Patient has NYHA Class II or III heart failure per most recent assessment, irrespective of left ventricular ejection fraction (LVEF)  [x]  []  Patient has documented recent history of symptomatic heart failure, defined as meeting any one of the following three criteria: 1. Hospital admission with primary diagnosis of HF within the last 12 months, OR 2. Intravenous HF therapy (e.g. IV diuretics/vasodilators) or ultrafiltration within the last 6 months, OR 3. Patient had the following BNP/NT-proBNP6 within the last 3 months: - If LVEF ? 50%, then BNP> 150 pg/ml or NT-proBNP > 450 pg/ml OR - If LVEF is <50%, then BNP> 300 pg/ml or NT-proBNP > 900 pg/ml  [x]  []  Patient is willing and able to comply with the protocol, including LINQ ICM insertion, CareLink transmissions (including adequate connectivity), study visits and remote care directions  [x]  []  Patient is 8 years of age or older  [x]  []  Patient has a life expectancy of 12 months or more    EXCLUSION []  [x]  Patient is currently implanted with a cardiovascular implantable electronic device (CIED)7 (e.g. ICM8, pacemaker, ICD, CRT-D or CRT-P device) or hemodynamic monitor   []  [x]  Patient is receiving temporary or permanent mechanical circulatory support   []  [x]  Patient had MI or PCI/CABG within past 90 days   []  [x]  Patient has had a heart transplant, or is currently on heart transplant list   []  [x]  Patient has severe valve stenosis on echocardiogram   []  [x]  Patient has primary pulmonary hypertension (pre-capillary, WHO group 1,3,4,5)   []  [x]  Patient is on chronic intravenous inotropic drug therapy (e.g. dobutamine, milrinone)   []  [x]  Patient has severe renal impairment (eGFR <30 mL/min)   []  [x]  Patient has systolic blood pressure of < 90 mmHg at the time of enrollment   []  [x]  Patient is on chronic renal dialysis   []  [x]  Patient is unable to undergo one round of PRN  medication intervention (i.e. 4 days of increased diuretics dose), based on the judgement of the investigator (e.g. known history of side effects or intolerance to PRN dosing of diuretics)   []  [x]  Patient has liver disease, defined as AST/ALT >5x normal, or bilirubin >2x normal   []  [x]  Patient has serum albumin < 3 g/dL   []  [x]  Patient has hypertrophic obstructive cardiomyopathy, constrictive pericarditis or amyloidosis   []  [x]   Patient has complex adult congenital heart disease   []  [x]  Patient has active cancer involving chemotherapy and/or radiation therapy   []  [x]  Patient weighs more than 500 pounds  []  [x]  Patient is pregnant (all females of child-bearing potential must have a negative pregnancy test within 1 week of enrollment)    Labs drawn 6 MHWT EQ 5D 5L completed   Implant Successful Randomization complete

## 2023-02-14 NOTE — Progress Notes (Unsigned)
Electrophysiology Office Note   Date:  02/14/2023   ID:  [name] Brown, DOB 1951/06/21, MRN [id number]  PCP:  Myrlene Broker, MD  Cardiologist:  Eden Emms Primary Electrophysiologist:  Stephaniemarie Stoffel Jorja Loa, MD    No chief complaint on file.     History of Present Illness: [name] Brown is a 72 y.o. male who is being seen today for the evaluation of atrial fibrillation at the request of Myrlene Broker, *. Presenting today for electrophysiology evaluation.    He has a history significant for coronary artery disease post three-vessel CABG, hypertension, hyperlipidemia, atrial fibrillation.  He is post ablation 03/19/2020 with repeat ablation 10/25/2022.  He went back into atrial fibrillation and had cardioversion 01/04/2023.  He presents today for ILR implant.  Today, denies symptoms of palpitations, chest pain, shortness of breath, orthopnea, PND, lower extremity edema, claudication, dizziness, presyncope, syncope, bleeding, or neurologic sequela. The patient is tolerating medications without difficulties.  He was started on Multaq at the last visit due to recurrent episodes of atrial fibrillation.  He is in atrial flutter today.  He feels poorly, feeling that he has both side effects from the Multaq as well as recurrent arrhythmia.  Past Medical History:  Diagnosis Date   Aortic aneurysm [facility]) Endograf 2009   Arthritis    Atrial fibrillation (HCC)    Bradycardia    CAD (coronary artery disease) CABG 03/2000   Median sternotomy for coronary artery bypass grafting x 3 (left   HTN (hypertension)    Hx of CABG 2001   severe 3 vessel disease   Hx of colonic polyp 06/2010   Hyperplastic    Hyperlipidemia    Myocardial infarction [facility])    Sleep apnea    Past Surgical History:  Procedure Laterality Date   ATRIAL FIBRILLATION ABLATION N/A 03/19/2020   Procedure: ATRIAL FIBRILLATION ABLATION;  Surgeon: Regan Lemming, MD;  Location: [facility] INVASIVE CV LAB;  Service:  Cardiovascular;  Laterality: N/A;   ATRIAL FIBRILLATION ABLATION N/A 10/25/2022   Procedure: ATRIAL FIBRILLATION ABLATION;  Surgeon: Regan Lemming, MD;  Location: [facility] INVASIVE CV LAB;  Service: Cardiovascular;  Laterality: N/A;   CARDIOVERSION N/A 11/28/2018   Procedure: CARDIOVERSION;  Surgeon: Parke Poisson, MD;  Location: [facility] ENDOSCOPY;  Service: Cardiovascular;  Laterality: N/A;   CARDIOVERSION N/A 01/13/2021   Procedure: CARDIOVERSION;  Surgeon: Chrystie Nose, MD;  Location: [facility] ENDOSCOPY;  Service: Cardiovascular;  Laterality: N/A;   CARDIOVERSION N/A 01/04/2023   Procedure: CARDIOVERSION;  Surgeon: Meriam Sprague, MD;  Location: [facility] ENDOSCOPY;  Service: Cardiovascular;  Laterality: N/A;   COLONOSCOPY     CORONARY ARTERY BYPASS GRAFT     AAA stent graft on 12/01/2007 at [facility]  coronary bypass in 2001. Dr. Cornelius Moras.   DRUG INDUCED ENDOSCOPY Right 02/18/2020   Procedure: DRUG INDUCED ENDOSCOPY;  Surgeon: Christia Reading, MD;  Location: [facility];  Service: ENT;  Laterality: Right;   FOOT SURGERY Left 07/19/2017   IMPLANTATION OF HYPOGLOSSAL NERVE STIMULATOR Right 07/30/2020   Procedure: IMPLANTATION OF HYPOGLOSSAL NERVE STIMULATOR Implantation of Chest wall respirator Sensor Electrode Electronic Analysis of Implanted Neurostimulator Pulse Generator;  Surgeon: Christia Reading, MD;  Location: [facility];  Service: ENT;  Laterality: Right;   INGUINAL HERNIA REPAIR Bilateral 08/06/2022   Procedure: LAPAROSCOPIC BILATERAL INGUINAL HERNIA REPAIR WITH MESH AND PRIMARY CLOSURE OF UMBILICAL HERNIA;  Surgeon: Quentin Ore, MD;  Location: [facility] ORS;  Service: General;  Laterality:  Bilateral;   LEFT HEART CATHETERIZATION WITH CORONARY ANGIOGRAM N/A 10/26/2013   Procedure: LEFT HEART CATHETERIZATION WITH CORONARY ANGIOGRAM;  Surgeon: Peter M Swaziland, MD;  Location: [facility] CATH LAB;  Service: Cardiovascular;  Laterality: N/A;   MASS EXCISION Right  08/10/2019   Procedure: EXCISION OF CYST FROM RIGHT SCALP;  Surgeon: Chevis Pretty III, MD;  Location: [facility];  Service: General;  Laterality: Right;     Current Outpatient Medications  Medication Sig Dispense Refill   acetaminophen (TYLENOL) 500 MG tablet Take 1,000 mg by mouth every 4 (four) hours as needed for moderate pain.     albuterol (VENTOLIN HFA) 108 (90 Base) MCG/ACT inhaler INHALE 2 PUFFS INTO THE LUNGS EVERY 6 HOURS AS NEEDED FOR WHEEZING OR SHORTNESS OF BREATH (Patient taking differently: Inhale 2 puffs into the lungs See admin instructions. Inhale 2 puffs at bedtime, may inhale 2 puffs every 6 hours as needed for shortness of breath) 54 g 1   aspirin EC 81 MG EC tablet Take 1 tablet (81 mg total) by mouth daily.     Biotin 40981 MCG TABS Take 10,000 mcg by mouth in the morning.     cholecalciferol (VITAMIN D) 25 MCG (1000 UT) tablet Take 1,000 Units by mouth every evening.     cyclobenzaprine (FLEXERIL) 5 MG tablet Take 1 tablet (5 mg total) by mouth 3 (three) times daily as needed for muscle spasms. 30 tablet 1   diclofenac sodium (VOLTAREN) 1 % GEL Apply 2 g topically 4 (four) times daily. (Patient taking differently: Apply 2 g topically 4 (four) times daily as needed (pain).) 100 g 3   divalproex (DEPAKOTE ER) 500 MG 24 hr tablet TAKE 1 TABLET(500 MG) BY MOUTH DAILY 90 tablet 1   doxycycline (MONODOX) 100 MG capsule Take 100 mg by mouth daily.     Evolocumab (REPATHA SURECLICK) 140 MG/ML SOAJ ADMINISTER 1 ML UNDER THE SKIN EVERY 14 DAYS 6 mL 3   ezetimibe (ZETIA) 10 MG tablet TAKE 1 TABLET(10 MG) BY MOUTH DAILY 90 tablet 3   furosemide (LASIX) 20 MG tablet TAKE 1 TABLET(20 MG) BY MOUTH TWICE DAILY 180 tablet 3   gabapentin (NEURONTIN) 300 MG capsule TAKE 1 CAPSULE(300 MG) BY MOUTH TWICE DAILY 180 capsule 3   Glucosamine-Chondroitin (GLUCOSAMINE CHONDR COMPLEX PO) Take 500 mg by mouth 2 (two) times daily.      guaiFENesin (MUCINEX) 600 MG 12 hr tablet Take 600 mg  by mouth 2 (two) times daily.     hydrALAZINE (APRESOLINE) 25 MG tablet TAKE 1 TABLET BY MOUTH EVERY MORNING AND AT BEDTIME 180 tablet 3   isosorbide mononitrate (IMDUR) 30 MG 24 hr tablet TAKE 1 TABLET BY MOUTH EVERY DAY 90 tablet 3   montelukast (SINGULAIR) 10 MG tablet TAKE 1 TABLET(10 MG) BY MOUTH AT BEDTIME 90 tablet 3   Multiple Vitamin (MULTIVITAMIN WITH MINERALS) TABS tablet Take 1 tablet by mouth every evening.     nitroGLYCERIN (NITROSTAT) 0.4 MG SL tablet Place 1 tablet (0.4 mg total) under the tongue every 5 (five) minutes as needed for chest pain. 25 tablet 11   Omega-3 Fatty Acids (FISH OIL) 1200 MG CAPS Take 1,200 mg by mouth 2 (two) times daily.     potassium chloride SA (KLOR-CON M) 20 MEQ tablet TAKE 1 TABLET BY MOUTH EVERY DAY 90 tablet 3   ramipril (ALTACE) 10 MG capsule TAKE 2 CAPSULES(20 MG) BY MOUTH DAILY (Patient taking differently: Take 20 mg by mouth at bedtime.) 180 capsule  2   tamsulosin (FLOMAX) 0.4 MG CAPS capsule TAKE 1 CAPSULE(0.4 MG) BY MOUTH DAILY 90 capsule 3   Tetrahydrozoline HCl (VISINE OP) Place 1 drop into both eyes daily as needed (irritation).     vitamin C (ASCORBIC ACID) 500 MG tablet Take 500 mg by mouth daily.     XARELTO 20 MG TABS tablet TAKE 1 TABLET(20 MG) BY MOUTH DAILY WITH SUPPER 90 tablet 3   zolpidem (AMBIEN CR) 12.5 MG CR tablet TAKE 1 TABLET(12.5 MG) BY MOUTH AT BEDTIME AS NEEDED FOR SLEEP (Patient taking differently: 12.5 mg at bedtime.) 30 tablet 5   dronedarone (MULTAQ) 400 MG tablet Take 1 tablet (400 mg total) by mouth 2 (two) times daily with a meal. (Patient not taking: Reported on 02/14/2023) 60 tablet 3   No current facility-administered medications for this visit.    Allergies:   Statins   Social History:  The patient  reports that he quit smoking about 31 years ago. His smoking use included cigarettes. He has a 60.00 pack-year smoking history. He has never used smokeless tobacco. He reports current drug use. Drug: Marijuana. He  reports that he does not drink alcohol.   Family History:  The patient's family history includes Heart attack in his father and mother; Heart disease in his father and mother.   ROS:  Please see the history of present illness.   Otherwise, review of systems is positive for none.   All other systems are reviewed and negative.   PHYSICAL EXAM: VS:  BP (!) 140/70   Pulse (!) 36   Ht 5\' 10"  (1.778 m)   Wt 192 lb (87.1 kg)   SpO2 97%   BMI 27.55 kg/m  , BMI Body mass index is 27.55 kg/m. GEN: Well nourished, well developed, in no acute distress  HEENT: normal  Neck: no JVD, carotid bruits, or masses Cardiac: RRR; no murmurs, rubs, or gallops,no edema  Respiratory:  clear to auscultation bilaterally, normal work of breathing GI: soft, nontender, nondistended, + BS MS: no deformity or atrophy  Skin: warm and dry Neuro:  Strength and sensation are intact Psych: euthymic mood, full affect  EKG:  EKG is ordered today. Personal review of the ekg ordered shows atrial flutter, PVCs   Recent Labs: 12/30/2022: BUN 25; Creatinine, Ser 1.20; Hemoglobin 14.1; Platelets 186; Potassium 4.5; Sodium 140 02/03/2023: NT-Pro BNP 636    Lipid Panel     Component Value Date/Time   CHOL 109 02/04/2021 0747   TRIG 78 02/04/2021 0747   HDL 34 (L) 02/04/2021 0747   CHOLHDL 3.2 02/04/2021 0747   CHOLHDL 4.8 11/29/2018 0223   VLDL 6 11/29/2018 0223   LDLCALC 59 02/04/2021 0747     Wt Readings from Last 3 Encounters:  02/14/23 192 lb (87.1 kg)  02/14/23 192 lb (87.1 kg)  01/25/23 191 lb (86.6 kg)      Other studies Reviewed: Additional studies/ records that were reviewed today include: TTE 01/21/20 Review of the above records today demonstrates:   1. Left ventricular ejection fraction, by estimation, is 55 to 60%. The  left ventricle has normal function. Left ventricular endocardial border  not optimally defined to evaluate regional wall motion. There is mild left  ventricular hypertrophy.  Left  ventricular diastolic parameters are consistent with Grade II diastolic  dysfunction (pseudonormalization).   2. Right ventricular systolic function is normal. The right ventricular  size is mildly enlarged. There is mildly elevated pulmonary artery  systolic pressure.   3.  Left atrial size was moderately dilated.   4. Right atrial size was mildly dilated.   5. The mitral valve is normal in structure. Mild mitral valve  regurgitation.   6. The aortic valve is tricuspid. Aortic valve regurgitation is mild.  Mild aortic valve sclerosis is present, with no evidence of aortic valve  stenosis.   7. The inferior vena cava is normal in size with greater than 50%  respiratory variability, suggesting right atrial pressure of 3 mmHg.   SPECT 11/08/18 The left ventricular ejection fraction is normal (55-65%). Nuclear stress EF: 56%. Post CABG septal wall hypokinesis. There was no ST segment deviation noted during stress. There is mildly reduced radiotracer uptake at both rest and stress in the anterior, lateral and inferolateral distributions. No ischemia identified. (POST CABG) This is a low risk study.  ASSESSMENT AND PLAN:  1.  Persistent atrial fibrillation/typical atrial flutter: Status post ablation 03/19/2020 with repeat ablation 10/25/2022.  He is continue to have short episodes of atrial fibrillation.  Would benefit from further monitoring with an ILR.  Vonne Mcdanel plan for Linq monitor implant.  He has been approved for the alleviate study.  Risk and benefits have been discussed.  Risk of bleeding and infection.  He understands the risks and is agreed to the procedure.  He feels poorly in atrial flutter.  He also feels that he is having complications from his Multaq.  He would like to stop the medication.  He is only further options of medications would be amiodarone or dofetilide.  Could also plan for atrial flutter ablation.  At this point, he would like to try and avoid procedures.  Mohamad Bruso give  him information on Tikosyn.  He Skyylar Kopf call us back and let us know if he wants to proceed with admission.  2.  Coronary disease: Post CABG.  Occluded radial to the OM.  No current chest pain.  3.  Hypertension: Mildly elevated but usually well-controlled  3.  Hyperlipidemia: Continue Repatha per lipid clinic  5.  Obstructive sleep apnea: Treated with neurostimulator.  Undergoing therapy per sleep medicine.  6.  Secondary hypercoagulable state: Currently on Eliquis for atrial fibrillation   Current medicines are reviewed at length with the patient today.   The patient does not have concerns regarding his medicines.  The following changes were made today: none  Labs/ tests ordered today include:  Orders Placed This Encounter  Procedures   EKG 12-Lead     Disposition:   FU 3 months  Signed, Zak Gondek Jorja Loa, MD  02/14/2023 5:07 PM     [facility] [address] [place] [address] [phone] (office) [phone] (fax)  SURGEON:  Yitty Roads Jorja Loa, MD     PREPROCEDURE DIAGNOSIS:  Atrial fibrillation    POSTPROCEDURE DIAGNOSIS: Atrial fibrillation     PROCEDURES:   1. Implantable loop recorder implantation    INTRODUCTION:  ORLO HOPP presents with a history of atrial fibrillation The costs of loop recorder monitoring have been discussed with the patient. Appropriate time out was performed prior to the procedure.    DESCRIPTION OF PROCEDURE:  Informed written consent was obtained.  The patient required no sedation for the procedure today.  Mapping over the patient's chest was performed to identify the area where electrograms were most prominent for ILR recording.  This area was found to be the left parasternal region over the 4th intercostal space. The patients left chest was therefore prepped and draped in the usual sterile fashion.  The skin overlying the left parasternal region was infiltrated with lidocaine for local analgesia.  A  0.5-cm incision was made over the left parasternal region over the 3rd intercostal space.  A subcutaneous ILR pocket was fashioned using a combination of sharp and blunt dissection.  A Medtronic Reveal LINQ (serial # [id number]) implantable loop recorder was then placed into the pocket  R waves were very prominent and measured 0.44mV.  Steri- Strips and a sterile dressing were then applied.  There were no early apparent complications.     CONCLUSIONS:   1. Successful implantation of a implantable loop recorder for a history of atrial fibrillation  2. No early apparent complications.   Timarie Labell Jorja Loa, MD  02/14/2023 5:07 PM

## 2023-02-14 NOTE — Patient Instructions (Addendum)
Medication Instructions:  Your physician recommends that you continue on your current medications as directed. Please refer to the Current Medication list given to you today.  Labwork: None ordered.  Testing/Procedures: None ordered.  Follow-Up:  Your physician wants you to follow-up to be determined.  You will receive a reminder letter in the mail two months in advance. If you don't receive a letter, please call our office to schedule the follow-up appointment.    Implantable Loop Recorder Placement, Care After This sheet gives you information about how to care for yourself after your procedure. Your health care provider may also give you more specific instructions. If you have problems or questions, contact your health care provider. What can I expect after the procedure? After the procedure, it is common to have: Soreness or discomfort near the incision. Some swelling or bruising near the incision.  Follow these instructions at home: Incision care  Monitor your cardiac device site for redness, swelling, and drainage. Call the device clinic at [phone] if you experience these symptoms or fever/chills.  Keep the large square bandage on your site for 24 hours and then you may remove it yourself. Keep the steri-strips underneath in place.   You may shower after 72 hours / 3 days from your procedure with the steri-strips in place. They will usually fall off on their own, or may be removed after 10 days. Pat dry.   Avoid lotions, ointments, or perfumes over your incision until it is well-healed.  Please do not submerge in water until your site is completely healed.   Your device is MRI compatible.   Remote monitoring is used to monitor your cardiac device from home. This monitoring is scheduled every month by our office. It allows [place] to keep an eye on the function of your device to ensure it is working properly.  If your wound site starts to bleed apply pressure.    For help  with the monitor please call Medtronic Monitor Support Specialist directly at [phone].    If you have any questions/concerns please call the device clinic at [phone].  Activity  Return to your normal activities.  General instructions Follow instructions from your health care provider about how to manage your implantable loop recorder and transmit the information. Learn how to activate a recording if this is necessary for your type of device. You may go through a metal detection gate, and you may let someone hold a metal detector over your chest. Show your ID card if needed. Do not have an MRI unless you check with your health care provider first. Take over-the-counter and prescription medicines only as told by your health care provider. Keep all follow-up visits as told by your health care provider. This is important. Contact a health care provider if: You have redness, swelling, or pain around your incision. You have a fever. You have pain that is not relieved by your pain medicine. You have triggered your device because of fainting (syncope) or because of a heartbeat that feels like it is racing, slow, fluttering, or skipping (palpitations). Get help right away if you have: Chest pain. Difficulty breathing. Summary After the procedure, it is common to have soreness or discomfort near the incision. Change your dressing as told by your health care provider. Follow instructions from your health care provider about how to manage your implantable loop recorder and transmit the information. Keep all follow-up visits as told by your health care provider. This is important. This information is not intended  to replace advice given to you by your health care provider. Make sure you discuss any questions you have with your health care provider. Document Released: 09/08/2015 Document Revised: 11/12/2017 Document Reviewed: 11/12/2017 Elsevier Patient Education  2020 [employer].    Dofetilide [facility]) Hospital Admission  Prior to day of admission:  Check with drug insurance company for cost of drug to ensure affordability --- Dofetilide 500 mcg twice a day.  GoodRx is an option if insurance copay is unaffordable. Patient assistance is also available.  A pharmacist will review all your medications for potential interactions with Tikosyn. If any medication changes are needed prior to admission we will be in touch with you.  If any new medications are started AFTER your admission date is set with Kennyth Arnold RN [phone]). Please notify our office immediately so your medication list can be updated and reviewed by our pharmacist again.  No Benadryl is allowed 3 days prior to admission.  Please ensure no missed doses of your anticoagulation (blood thinner) for 3 weeks prior to admission. If a dose is missed please notify our office immediately.  If you are on coumadin/warfarin we will require 4 weekly therapeutic INR checks prior to admission.   On day of admission:  Afib Clinic office visit will be scheduled on the morning of admission for preliminary labs and EKG.  Please take your morning medications and have a normal breakfast prior to arrival to the appointment.     Tikosyn initiation requires a 3 night/4 day hospital stay with constant telemetry monitoring. You will have an EKG after each dose of Tikosyn as well as daily lab draws.  You may bring personal belongings/clothing with you to the hospital. Please leave your suitcase in the car until you arrive in admissions.  If the drug does not convert you to normal rhythm a cardioversion will be performed after the 4th dose of Tikosyn. The cardioversion will be scheduled prior to admission - you may notice this on your appointments and receive a call from the hospital pharmacy to review your medications. You do not need to do anything to prepare for the cardioversion prior to admission.   Time of admission is  dependent on bed availability in the hospital. In some instances, you will be sent home until bed is available. Rarely admission can be delayed to the following day if hospital census prevents available beds.  Questions please call our office at [phone]

## 2023-02-15 ENCOUNTER — Emergency Department (HOSPITAL_BASED_OUTPATIENT_CLINIC_OR_DEPARTMENT_OTHER)
Admission: EM | Admit: 2023-02-15 | Discharge: 2023-02-15 | Disposition: A | Discharge status: Home / Self Care | Payer: PPO | Attending: Emergency Medicine | Admitting: Emergency Medicine

## 2023-02-15 ENCOUNTER — Telehealth: Payer: Self-pay | Admitting: Cardiology

## 2023-02-15 ENCOUNTER — Other Ambulatory Visit: Payer: Self-pay | Admitting: Cardiovascular Disease

## 2023-02-15 ENCOUNTER — Other Ambulatory Visit: Payer: Self-pay

## 2023-02-15 DIAGNOSIS — Z7982 Long term (current) use of aspirin: Secondary | ICD-10-CM | POA: Diagnosis not present

## 2023-02-15 DIAGNOSIS — I2510 Atherosclerotic heart disease of native coronary artery without angina pectoris: Secondary | ICD-10-CM | POA: Diagnosis not present

## 2023-02-15 DIAGNOSIS — Y69 Unspecified misadventure during surgical and medical care: Secondary | ICD-10-CM | POA: Insufficient documentation

## 2023-02-15 DIAGNOSIS — I10 Essential (primary) hypertension: Secondary | ICD-10-CM | POA: Diagnosis not present

## 2023-02-15 DIAGNOSIS — T8131XA Disruption of external operation (surgical) wound, not elsewhere classified, initial encounter: Secondary | ICD-10-CM | POA: Diagnosis not present

## 2023-02-15 DIAGNOSIS — Z79899 Other long term (current) drug therapy: Secondary | ICD-10-CM | POA: Insufficient documentation

## 2023-02-15 DIAGNOSIS — S21112A Laceration without foreign body of left front wall of thorax without penetration into thoracic cavity, initial encounter: Secondary | ICD-10-CM | POA: Diagnosis not present

## 2023-02-15 DIAGNOSIS — Z87891 Personal history of nicotine dependence: Secondary | ICD-10-CM | POA: Insufficient documentation

## 2023-02-15 DIAGNOSIS — Z951 Presence of aortocoronary bypass graft: Secondary | ICD-10-CM | POA: Diagnosis not present

## 2023-02-15 MED ORDER — LIDOCAINE-EPINEPHRINE (PF) 2 %-1:200000 IJ SOLN
20.0000 mL | Freq: Once | INTRAMUSCULAR | Status: AC
  Administered 2023-02-15: 20 mL

## 2023-02-15 MED ORDER — OXIDIZED CELLULOSE EX PADS
1.0000 | MEDICATED_PAD | Freq: Once | CUTANEOUS | Status: AC
  Administered 2023-02-15: 1 via TOPICAL
  Filled 2023-02-15: qty 1

## 2023-02-15 MED ORDER — POTASSIUM CHLORIDE ER 10 MEQ PO TBCR: EXTENDED_RELEASE_TABLET | ORAL | 3 refills | Status: DC

## 2023-02-15 MED ORDER — FUROSEMIDE 20 MG PO TABS: ORAL_TABLET | ORAL | 3 refills | Status: DC

## 2023-02-15 MED ORDER — LIDOCAINE-EPINEPHRINE (PF) 2 %-1:200000 IJ SOLN
INTRAMUSCULAR | Status: AC
  Filled 2023-02-15: qty 20

## 2023-02-15 NOTE — Telephone Encounter (Signed)
Spoke to research & MD. Pt scheduled to see MD Friday morning for wound eval. Pt agreeable to plan.

## 2023-02-15 NOTE — ED Triage Notes (Signed)
Pt reports having a loop recorder placed yesterday. He says he woke up around 0330 this morning and noticed the incision on his chest was bleeding, soaked through the dressing. He has held pressure for about 2 hours and it is still bleeding. Pt is on Xarelto.

## 2023-02-15 NOTE — ED Provider Notes (Signed)
[facility] Provider Note: Lowella Dell, MD, FACEP  CSN: [id number] MRN: [id number] ARRIVAL: 02/15/23 at 0556 ROOM: DB014/DB014   CHIEF COMPLAINT  Wound Check   HISTORY OF PRESENT ILLNESS  02/15/23 6:20 AM [name] Brown is a 72 y.o. male who is on Xarelto for chronic atrial flutter/fibrillation.  He had an implantable loop recorder placed in his left chest yesterday by Dr. [name].  The wound was closed with Steri-Strips and covered with a sterile dressing.  Apparently the patient dislodged the dressing and Steri-Strips in his sleep and awakened with bleeding from the wound.  He was not able to control the bleeding with pressure.   Past Medical History:  Diagnosis Date   Aortic aneurysm [facility]) Endograf 2009   Arthritis    Atrial fibrillation (HCC)    Bradycardia    CAD (coronary artery disease) CABG 03/2000   Median sternotomy for coronary artery bypass grafting x 3 (left   HTN (hypertension)    Hx of CABG 2001   severe 3 vessel disease   Hx of colonic polyp 06/2010   Hyperplastic    Hyperlipidemia    Myocardial infarction [facility])    Sleep apnea     Past Surgical History:  Procedure Laterality Date   ATRIAL FIBRILLATION ABLATION N/A 03/19/2020   Procedure: ATRIAL FIBRILLATION ABLATION;  Surgeon: Regan Lemming, MD;  [facility];  Service: Cardiovascular;  Laterality: N/A;   ATRIAL FIBRILLATION ABLATION N/A 10/25/2022   Procedure: ATRIAL FIBRILLATION ABLATION;  Surgeon: Regan Lemming, MD;  [facility]: [facility];  Service: Cardiovascular;  Laterality: N/A;   CARDIOVERSION N/A 11/28/2018   Procedure: CARDIOVERSION;  Surgeon: Parke Poisson, MD;  Location: [facility] ENDOSCOPY;  Service: Cardiovascular;  Laterality: N/A;   CARDIOVERSION N/A 01/13/2021   Procedure: CARDIOVERSION;  Surgeon: Chrystie Nose, MD;  Location: [facility] ENDOSCOPY;  Service: Cardiovascular;  Laterality: N/A;   CARDIOVERSION N/A 01/04/2023   Procedure: CARDIOVERSION;   Surgeon: Meriam Sprague, MD;  Location: [facility] ENDOSCOPY;  Service: Cardiovascular;  Laterality: N/A;   COLONOSCOPY     CORONARY ARTERY BYPASS GRAFT     AAA stent graft on 12/01/2007 at [facility]  coronary bypass in 2001. Dr. Cornelius Moras.   DRUG INDUCED ENDOSCOPY Right 02/18/2020   Procedure: DRUG INDUCED ENDOSCOPY;  Surgeon: Christia Reading, MD;  Location: [facility];  Service: ENT;  Laterality: Right;   FOOT SURGERY Left 07/19/2017   IMPLANTATION OF HYPOGLOSSAL NERVE STIMULATOR Right 07/30/2020   Procedure: IMPLANTATION OF HYPOGLOSSAL NERVE STIMULATOR Implantation of Chest wall respirator Sensor Electrode Electronic Analysis of Implanted Neurostimulator Pulse Generator;  Surgeon: Christia Reading, MD;  Location: [facility];  Service: ENT;  Laterality: Right;   INGUINAL HERNIA REPAIR Bilateral 08/06/2022   Procedure: LAPAROSCOPIC BILATERAL INGUINAL HERNIA REPAIR WITH MESH AND PRIMARY CLOSURE OF UMBILICAL HERNIA;  Surgeon: Quentin Ore, MD;  Location: [facility];  Service: General;  Laterality: Bilateral;   LEFT HEART CATHETERIZATION WITH CORONARY ANGIOGRAM N/A 10/26/2013   Procedure: LEFT HEART CATHETERIZATION WITH CORONARY ANGIOGRAM;  Surgeon: Peter M Swaziland, MD;  Location: [facility] CATH LAB;  Service: Cardiovascular;  Laterality: N/A;   MASS EXCISION Right 08/10/2019   Procedure: EXCISION OF CYST FROM RIGHT SCALP;  Surgeon: Griselda Miner, MD;  Location: [facility];  Service: General;  Laterality: Right;    Family History  Problem Relation Age of Onset   Heart attack Mother    Heart disease Mother  Heart attack Father    Heart disease Father    Rectal cancer Neg Hx    Stomach cancer Neg Hx    Pancreatic cancer Neg Hx    Esophageal cancer Neg Hx    Colon polyps Neg Hx    Colon cancer Neg Hx     Social History   Tobacco Use   Smoking status: Former    Packs/day: 2.00    Years: 30.00    Additional pack years: 0.00    Total  pack years: 60.00    Types: Cigarettes    Quit date: 01/05/1992    Years since quitting: 31.1   Smokeless tobacco: Never   Tobacco comments:    Former smoker quit 19 yrs ago 11/22/22  Vaping Use   Vaping Use: Never used  Substance Use Topics   Alcohol use: No   Drug use: Yes    Types: Marijuana    Prior to Admission medications   Medication Sig Start Date End Date Taking? Authorizing Provider  acetaminophen (TYLENOL) 500 MG tablet Take 1,000 mg by mouth every 4 (four) hours as needed for moderate pain.    [provider]  albuterol (VENTOLIN HFA) 108 (90 Base) MCG/ACT inhaler INHALE 2 PUFFS INTO THE LUNGS EVERY 6 HOURS AS NEEDED FOR WHEEZING OR SHORTNESS OF BREATH Patient taking differently: Inhale 2 puffs into the lungs See admin instructions. Inhale 2 puffs at bedtime, may inhale 2 puffs every 6 hours as needed for shortness of breath 08/11/22   Myrlene Broker, MD  aspirin EC 81 MG EC tablet Take 1 tablet (81 mg total) by mouth daily. 10/29/13   Abelino Derrick, PA-C  Biotin [address] MCG TABS Take 10,000 mcg by mouth in the morning.    [provider]  cholecalciferol (VITAMIN D) 25 MCG (1000 UT) tablet Take 1,000 Units by mouth every evening.    [provider]  cyclobenzaprine (FLEXERIL) 5 MG tablet Take 1 tablet (5 mg total) by mouth 3 (three) times daily as needed for muscle spasms. 05/25/22   Myrlene Broker, MD  diclofenac sodium (VOLTAREN) 1 % GEL Apply 2 g topically 4 (four) times daily. Patient taking differently: Apply 2 g topically 4 (four) times daily as needed (pain). 04/09/19   Myrlene Broker, MD  divalproex (DEPAKOTE ER) 500 MG 24 hr tablet TAKE 1 TABLET(500 MG) BY MOUTH DAILY 01/21/23   Myrlene Broker, MD  doxycycline (MONODOX) 100 MG capsule Take 100 mg by mouth daily.    [provider]  Evolocumab (REPATHA SURECLICK) 140 MG/ML SOAJ ADMINISTER 1 ML UNDER THE SKIN EVERY 14 DAYS 09/06/22   Wendall Stade, MD   ezetimibe (ZETIA) 10 MG tablet TAKE 1 TABLET(10 MG) BY MOUTH DAILY 12/30/22   Wendall Stade, MD  furosemide (LASIX) 20 MG tablet TAKE 1 TABLET(20 MG) BY MOUTH TWICE DAILY 12/20/22   Wendall Stade, MD  gabapentin (NEURONTIN) 300 MG capsule TAKE 1 CAPSULE(300 MG) BY MOUTH TWICE DAILY 06/11/22   Myrlene Broker, MD  Glucosamine-Chondroitin (GLUCOSAMINE CHONDR COMPLEX PO) Take 500 mg by mouth 2 (two) times daily.     [provider]  guaiFENesin (MUCINEX) 600 MG 12 hr tablet Take 600 mg by mouth 2 (two) times daily.    [provider]  hydrALAZINE (APRESOLINE) 25 MG tablet TAKE 1 TABLET BY MOUTH EVERY MORNING AND AT BEDTIME 05/17/22   Camnitz, Will Daphine Deutscher, MD  isosorbide mononitrate (IMDUR) 30 MG 24 hr tablet TAKE 1 TABLET  BY MOUTH EVERY DAY 01/20/23   Wendall Stade, MD  montelukast (SINGULAIR) 10 MG tablet TAKE 1 TABLET(10 MG) BY MOUTH AT BEDTIME 12/13/22   Myrlene Broker, MD  Multiple Vitamin (MULTIVITAMIN WITH MINERALS) TABS tablet Take 1 tablet by mouth every evening.    [provider]  nitroGLYCERIN (NITROSTAT) 0.4 MG SL tablet Place 1 tablet (0.4 mg total) under the tongue every 5 (five) minutes as needed for chest pain. 12/31/22   Wendall Stade, MD  Omega-3 Fatty Acids (FISH OIL) 1200 MG CAPS Take 1,200 mg by mouth 2 (two) times daily.    [provider]  potassium chloride SA (KLOR-CON M) 20 MEQ tablet TAKE 1 TABLET BY MOUTH EVERY DAY 06/17/22   Wendall Stade, MD  ramipril (ALTACE) 10 MG capsule TAKE 2 CAPSULES(20 MG) BY MOUTH DAILY Patient taking differently: Take 20 mg by mouth at bedtime. 05/17/22   Wendall Stade, MD  tamsulosin (FLOMAX) 0.4 MG CAPS capsule TAKE 1 CAPSULE(0.4 MG) BY MOUTH DAILY 05/03/22   Myrlene Broker, MD  Tetrahydrozoline HCl (VISINE OP) Place 1 drop into both eyes daily as needed (irritation).    [provider]  vitamin C (ASCORBIC ACID) 500 MG tablet Take 500 mg by mouth daily.    [provider]   XARELTO 20 MG TABS tablet TAKE 1 TABLET(20 MG) BY MOUTH DAILY WITH SUPPER 06/17/22   Wendall Stade, MD  zolpidem (AMBIEN CR) 12.5 MG CR tablet TAKE 1 TABLET(12.5 MG) BY MOUTH AT BEDTIME AS NEEDED FOR SLEEP Patient taking differently: 12.5 mg at bedtime. 09/13/22   Myrlene Broker, MD    Allergies Statins   REVIEW OF SYSTEMS  Negative except as noted here or in the History of Present Illness.   PHYSICAL EXAMINATION  Initial Vital Signs Blood pressure (!) 170/84, pulse 62, temperature 98 F (36.7 C), temperature source Oral, resp. rate 16, height 5\' 10"  (1.778 m), weight 87 kg, SpO2 99 %.  Examination General: Well-developed, well-nourished male in no acute distress; appearance consistent with age of record HENT: normocephalic; atraumatic Eyes: Normal appearance Neck: supple Heart: regular rate and rhythm; atrial flutter 4:1 on monitor with occasional PVCs Lungs: clear to auscultation bilaterally Chest: Medical device palpable under skin of left upper chest with small adjacent bleeding incision Abdomen: soft; nondistended; nontender; bowel sounds present Extremities: No deformity; full range of motion; pulses normal Neurologic: Awake, alert and oriented; motor function intact in all extremities and symmetric; no facial droop Skin: Warm and dry Psychiatric: Normal mood and affect   RESULTS  Summary of this visit's results, reviewed and interpreted by myself:   EKG Interpretation  Date/Time:    Ventricular Rate:    PR Interval:    QRS Duration:   QT Interval:    QTC Calculation:   R Axis:     Text Interpretation:         Laboratory Studies: No results found for this or any previous visit (from the past 24 hour(s)). Imaging Studies: No results found.  ED COURSE and MDM  Nursing notes, initial and subsequent vitals signs, including pulse oximetry, reviewed and interpreted by myself.  Vitals:   02/15/23 0601 02/15/23 0602 02/15/23 0603  BP:   (!) 170/84   Pulse:  (!) 35 62  Resp:  16   Temp: 98 F (36.7 C)    TempSrc: Oral    SpO2:  97% 99%  Weight: 87 kg    Height: 5\' 10"  (1.778 m)  Medications  oxidized cellulose (Surgicel) pad 1 each (has no administration in time range)  lidocaine-EPINEPHrine (XYLOCAINE W/EPI) 2 %-1:200000 (PF) injection 20 mL (20 mLs Other Given 02/15/23 0607)   6:43 AM The wound continues to ooze despite suture closure.  We will place Surgicel on the wound and cover with Tegaderm.  The patient was advised to leave this in place for the next 24 hours.   PROCEDURES  Procedures LACERATION REPAIR Performed by: Carlisle Beers Skyleigh Windle Authorized by: Carlisle Beers Johnice Riebe Consent: Verbal consent obtained. Risks and benefits: risks, benefits and alternatives were discussed Consent given by: patient Patient identity confirmed: provided demographic data Prepped and Draped in normal sterile fashion Wound explored  Laceration Location: Left upper chest  Laceration Length: 1 cm  No Foreign Bodies seen or palpated  Anesthesia: local infiltration  Local anesthetic: lidocaine 2% with epinephrine  Anesthetic total: 2 ml  Irrigation method: syringe Amount of cleaning: standard  Skin closure: 5-0 Prolene  Number of sutures: 2  Technique: Simple interrupted  Patient tolerance: Patient tolerated the procedure well with no immediate complications.   ED DIAGNOSES     ICD-10-CM   1. Wound dehiscence, surgical, initial encounter  T81.31XA Ambulatory referral to Cardiology         Paula Libra, MD 02/15/23 [phone]

## 2023-02-15 NOTE — Telephone Encounter (Signed)
New Message:      Patient said he had a Loop Recorder put in yesterday. He wants Dr [name] to know that this morning at 4:00 AM  it started leaking and he had to go to the ER. He is now back at home. He says he wants the nurse to please call him.

## 2023-02-15 NOTE — Addendum Note (Signed)
Addended by: Dutch Quint B on: 02/15/2023 08:52 AM   Modules accepted: Orders

## 2023-02-16 LAB — BASIC METABOLIC PANEL
BUN/Creatinine Ratio: 21 (ref 10–24)
BUN: 29 mg/dL — ABNORMAL HIGH (ref 8–27)
CO2: 22 mmol/L (ref 20–29)
Calcium: 9.8 mg/dL (ref 8.6–10.2)
Chloride: 100 mmol/L (ref 96–106)
Creatinine, Ser: 1.38 mg/dL — ABNORMAL HIGH (ref 0.76–1.27)
Glucose: 94 mg/dL (ref 70–99)
Potassium: 4.6 mmol/L (ref 3.5–5.2)
Sodium: 137 mmol/L (ref 134–144)
eGFR: 55 mL/min/{1.73_m2} — ABNORMAL LOW (ref >59)

## 2023-02-16 LAB — CBC
Hematocrit: 39.4 % (ref 37.5–51.0)
Hemoglobin: 13.6 g/dL (ref 13.0–17.7)
MCH: 33.9 pg — ABNORMAL HIGH (ref 26.6–33.0)
MCHC: 34.5 g/dL (ref 31.5–35.7)
MCV: 98 fL — ABNORMAL HIGH (ref 79–97)
Platelets: 223 10*3/uL (ref 150–450)
RBC: 4.01 x10E6/uL — ABNORMAL LOW (ref 4.14–5.80)
RDW: 13.5 % (ref 11.6–15.4)
WBC: 7.1 10*3/uL (ref 3.4–10.8)

## 2023-02-16 LAB — BRAIN NATRIURETIC PEPTIDE: BNP: 195 pg/mL — ABNORMAL HIGH (ref 0.0–100.0)

## 2023-02-18 ENCOUNTER — Ambulatory Visit: Payer: PPO | Attending: Cardiology | Admitting: Cardiology

## 2023-02-18 ENCOUNTER — Encounter: Payer: Self-pay | Admitting: Cardiology

## 2023-02-18 ENCOUNTER — Telehealth: Payer: Self-pay | Admitting: Pharmacist

## 2023-02-18 VITALS — BP 130/82 | HR 78 | Ht 70.0 in | Wt 194.0 lb

## 2023-02-18 DIAGNOSIS — I4819 Other persistent atrial fibrillation: Secondary | ICD-10-CM

## 2023-02-18 DIAGNOSIS — D6869 Other thrombophilia: Secondary | ICD-10-CM

## 2023-02-18 NOTE — Progress Notes (Signed)
Electrophysiology Office Note   Date:  02/18/2023   ID:  [name] Brown, DOB 07/16/1951, MRN [id number]  PCP:  [name] Broker, MD  Cardiologist:  [name] Brown Primary Electrophysiologist:  [name] Pesce Jorja Loa, MD    No chief complaint on file.     History of Present Illness: [name] Brown is a 72 y.o. male who is being seen today for the evaluation of atrial fibrillation at the request of [name] Brown, *. Presenting today for electrophysiology evaluation.    Has a history of significant coronary artery disease post three-vessel CABG, hypertension, hyperlipidemia, atrial fibrillation.  He is post ablation 03/19/2020 with repeat ablation 10/25/2022.  He has had further episodes of atrial fibrillation and atrial flutter.  He has now post ILR implant.  The night of his implant, the wound dehisced.  He presented to the emergency room and stitches were placed.  Today, denies symptoms of palpitations, chest pain, shortness of breath, orthopnea, PND, lower extremity edema, claudication, dizziness, presyncope, syncope, bleeding, or neurologic sequela. The patient is tolerating medications without difficulties.  He continues to feel fatigued.  He remains in atrial flutter.  He would like admission to the hospital for dofetilide.   Past Medical History:  Diagnosis Date   Aortic aneurysm [facility]) Endograf 2009   Arthritis    Atrial fibrillation (HCC)    Bradycardia    CAD (coronary artery disease) CABG 03/2000   Median sternotomy for coronary artery bypass grafting x 3 (left   HTN (hypertension)    Hx of CABG 2001   severe 3 vessel disease   Hx of colonic polyp 06/2010   Hyperplastic    Hyperlipidemia    Myocardial infarction [facility])    Sleep apnea    Past Surgical History:  Procedure Laterality Date   ATRIAL FIBRILLATION ABLATION N/A 03/19/2020   Procedure: ATRIAL FIBRILLATION ABLATION;  Surgeon: [name] Lemming, MD;  [facility];  Service: Cardiovascular;   Laterality: N/A;   ATRIAL FIBRILLATION ABLATION N/A 10/25/2022   Procedure: ATRIAL FIBRILLATION ABLATION;  Surgeon: [name] Lemming, MD;  [facility];  Service: Cardiovascular;  Laterality: N/A;   CARDIOVERSION N/A 11/28/2018   Procedure: CARDIOVERSION;  Surgeon: [name] Poisson, MD;  Location: [facility] ENDOSCOPY;  Service: Cardiovascular;  Laterality: N/A;   CARDIOVERSION N/A 01/13/2021   Procedure: CARDIOVERSION;  Surgeon: [name] Nose, MD;  Location: [facility] ENDOSCOPY;  Service: Cardiovascular;  Laterality: N/A;   CARDIOVERSION N/A 01/04/2023   Procedure: CARDIOVERSION;  Surgeon: [name] Sprague, MD;  Location: [facility] ENDOSCOPY;  Service: Cardiovascular;  Laterality: N/A;   COLONOSCOPY     CORONARY ARTERY BYPASS GRAFT     AAA stent graft on 12/01/2007 at [facility]  coronary bypass in 2001. Dr. Cornelius Brown.   DRUG INDUCED ENDOSCOPY Right 02/18/2020   Procedure: DRUG INDUCED ENDOSCOPY;  Surgeon: [name] Reading, MD;  Location: [facility];  Service: ENT;  Laterality: Right;   FOOT SURGERY Left 07/19/2017   IMPLANTATION OF HYPOGLOSSAL NERVE STIMULATOR Right 07/30/2020   Procedure: IMPLANTATION OF HYPOGLOSSAL NERVE STIMULATOR Implantation of Chest wall respirator Sensor Electrode Electronic Analysis of Implanted Neurostimulator Pulse Generator;  Surgeon: [name] Reading, MD;  Location:  SURGERY CENTER;  Service: ENT;  Laterality: Right;   INGUINAL HERNIA REPAIR Bilateral 08/06/2022   Procedure: LAPAROSCOPIC BILATERAL INGUINAL HERNIA REPAIR WITH MESH AND PRIMARY CLOSURE OF UMBILICAL HERNIA;  Surgeon: [name] Ore, MD;  Location: [facility];  Service: General;  Laterality:  Bilateral;   LEFT HEART CATHETERIZATION WITH CORONARY ANGIOGRAM N/A 10/26/2013   Procedure: LEFT HEART CATHETERIZATION WITH CORONARY ANGIOGRAM;  Surgeon: [name] M Swaziland, MD;  Location: [facility] CATH LAB;  Service: Cardiovascular;  Laterality: N/A;   MASS EXCISION Right 08/10/2019    Procedure: EXCISION OF CYST FROM RIGHT SCALP;  Surgeon: [name] Pretty III, MD;  Location: [facility];  Service: General;  Laterality: Right;     Current Outpatient Medications  Medication Sig Dispense Refill   acetaminophen (TYLENOL) 500 MG tablet Take 1,000 mg by mouth every 4 (four) hours as needed for moderate pain.     albuterol (VENTOLIN HFA) 108 (90 Base) MCG/ACT inhaler INHALE 2 PUFFS INTO THE LUNGS EVERY 6 HOURS AS NEEDED FOR WHEEZING OR SHORTNESS OF BREATH (Patient taking differently: Inhale 2 puffs into the lungs See admin instructions. Inhale 2 puffs at bedtime, may inhale 2 puffs every 6 hours as needed for shortness of breath) 54 g 1   aspirin EC 81 MG EC tablet Take 1 tablet (81 mg total) by mouth daily.     Biotin 09811 MCG TABS Take 10,000 mcg by mouth in the morning.     cholecalciferol (VITAMIN D) 25 MCG (1000 UT) tablet Take 1,000 Units by mouth every evening.     cyclobenzaprine (FLEXERIL) 5 MG tablet Take 1 tablet (5 mg total) by mouth 3 (three) times daily as needed for muscle spasms. 30 tablet 1   diclofenac sodium (VOLTAREN) 1 % GEL Apply 2 g topically 4 (four) times daily. (Patient taking differently: Apply 2 g topically 4 (four) times daily as needed (pain).) 100 g 3   divalproex (DEPAKOTE ER) 500 MG 24 hr tablet TAKE 1 TABLET(500 MG) BY MOUTH DAILY 90 tablet 1   doxycycline (MONODOX) 100 MG capsule Take 100 mg by mouth daily.     Evolocumab (REPATHA SURECLICK) 140 MG/ML SOAJ ADMINISTER 1 ML UNDER THE SKIN EVERY 14 DAYS 6 mL 3   ezetimibe (ZETIA) 10 MG tablet TAKE 1 TABLET(10 MG) BY MOUTH DAILY 90 tablet 3   furosemide (LASIX) 20 MG tablet Take 1 tablet (20 MG) By mouth twice daily. As needed, patient may take 20 MG (1 tablet) additional Lasix PRN by mouth daily x 4 days as directed per [employer] HF Study PRN plan 180 tablet 3   gabapentin (NEURONTIN) 300 MG capsule TAKE 1 CAPSULE(300 MG) BY MOUTH TWICE DAILY 180 capsule 3   Glucosamine-Chondroitin  (GLUCOSAMINE CHONDR COMPLEX PO) Take 500 mg by mouth 2 (two) times daily.      guaiFENesin (MUCINEX) 600 MG 12 hr tablet Take 600 mg by mouth 2 (two) times daily.     hydrALAZINE (APRESOLINE) 25 MG tablet TAKE 1 TABLET BY MOUTH EVERY MORNING AND AT BEDTIME 180 tablet 3   isosorbide mononitrate (IMDUR) 30 MG 24 hr tablet TAKE 1 TABLET BY MOUTH EVERY DAY 90 tablet 3   montelukast (SINGULAIR) 10 MG tablet TAKE 1 TABLET(10 MG) BY MOUTH AT BEDTIME 90 tablet 3   Multiple Vitamin (MULTIVITAMIN WITH MINERALS) TABS tablet Take 1 tablet by mouth every evening.     nitroGLYCERIN (NITROSTAT) 0.4 MG SL tablet Place 1 tablet (0.4 mg total) under the tongue every 5 (five) minutes as needed for chest pain. 25 tablet 11   Omega-3 Fatty Acids (FISH OIL) 1200 MG CAPS Take 1,200 mg by mouth 2 (two) times daily.     potassium chloride (KLOR-CON) 10 MEQ tablet As needed patient may take 10 mEq additional Potassium PRN  by mouth daily x 4 days as directed per [employer] HF Study PRN plan 90 tablet 3   potassium chloride SA (KLOR-CON M) 20 MEQ tablet TAKE 1 TABLET BY MOUTH EVERY DAY 90 tablet 3   ramipril (ALTACE) 10 MG capsule TAKE 2 CAPSULES(20 MG) BY MOUTH DAILY 180 capsule 3   tamsulosin (FLOMAX) 0.4 MG CAPS capsule TAKE 1 CAPSULE(0.4 MG) BY MOUTH DAILY 90 capsule 3   Tetrahydrozoline HCl (VISINE OP) Place 1 drop into both eyes daily as needed (irritation).     vitamin C (ASCORBIC ACID) 500 MG tablet Take 500 mg by mouth daily.     XARELTO 20 MG TABS tablet TAKE 1 TABLET(20 MG) BY MOUTH DAILY WITH SUPPER 90 tablet 3   zolpidem (AMBIEN CR) 12.5 MG CR tablet TAKE 1 TABLET(12.5 MG) BY MOUTH AT BEDTIME AS NEEDED FOR SLEEP (Patient taking differently: 12.5 mg at bedtime.) 30 tablet 5   No current facility-administered medications for this visit.    Allergies:   Statins   Social History:  The patient  reports that he quit smoking about 31 years ago. His smoking use included cigarettes. He has a 60.00 pack-year  smoking history. He has never used smokeless tobacco. He reports current drug use. Drug: Marijuana. He reports that he does not drink alcohol.   Family History:  The patient's family history includes Heart attack in his father and mother; Heart disease in his father and mother.   ROS:  Please see the history of present illness.   Otherwise, review of systems is positive for none.   All other systems are reviewed and negative.   PHYSICAL EXAM: VS:  BP 130/82   Pulse 78   Ht 5\' 10"  (1.778 m)   Wt 194 lb (88 kg)   SpO2 96%   BMI 27.84 kg/m  , BMI Body mass index is 27.84 kg/m. GEN: Well nourished, well developed, in no acute distress  HEENT: normal  Neck: no JVD, carotid bruits, or masses Cardiac: RRR; no murmurs, rubs, or gallops,no edema  Respiratory:  clear to auscultation bilaterally, normal work of breathing GI: soft, nontender, nondistended, + BS MS: no deformity or atrophy  Skin: warm and dry, device site well healed, 2 stitches in ILR incision site Neuro:  Strength and sensation are intact Psych: euthymic mood, full affect  Recent Labs: 02/03/2023: NT-Pro BNP 636 02/14/2023: BNP 195.0; BUN 29; Creatinine, Ser 1.38; Hemoglobin 13.6; Platelets 223; Potassium 4.6; Sodium 137    Lipid Panel     Component Value Date/Time   CHOL 109 02/04/2021 0747   TRIG 78 02/04/2021 0747   HDL 34 (L) 02/04/2021 0747   CHOLHDL 3.2 02/04/2021 0747   CHOLHDL 4.8 11/29/2018 0223   VLDL 6 11/29/2018 0223   LDLCALC 59 02/04/2021 0747     Wt Readings from Last 3 Encounters:  02/18/23 194 lb (88 kg)  02/15/23 191 lb 12.8 oz (87 kg)  02/14/23 192 lb (87.1 kg)      Other studies Reviewed: Additional studies/ records that were reviewed today include: TTE 01/21/20 Review of the above records today demonstrates:   1. Left ventricular ejection fraction, by estimation, is 55 to 60%. The  left ventricle has normal function. Left ventricular endocardial border  not optimally defined to evaluate  regional wall motion. There is mild left  ventricular hypertrophy. Left  ventricular diastolic parameters are consistent with Grade II diastolic  dysfunction (pseudonormalization).   2. Right ventricular systolic function is normal. The right ventricular  size is mildly enlarged. There is mildly elevated pulmonary artery  systolic pressure.   3. Left atrial size was moderately dilated.   4. Right atrial size was mildly dilated.   5. The mitral valve is normal in structure. Mild mitral valve  regurgitation.   6. The aortic valve is tricuspid. Aortic valve regurgitation is mild.  Mild aortic valve sclerosis is present, with no evidence of aortic valve  stenosis.   7. The inferior vena cava is normal in size with greater than 50%  respiratory variability, suggesting right atrial pressure of 3 mmHg.   SPECT 11/08/18 The left ventricular ejection fraction is normal (55-65%). Nuclear stress EF: 56%. Post CABG septal wall hypokinesis. There was no ST segment deviation noted during stress. There is mildly reduced radiotracer uptake at both rest and stress in the anterior, lateral and inferolateral distributions. No ischemia identified. (POST CABG) This is a low risk study.  ASSESSMENT AND PLAN:  1.  Persistent atrial fibrillation/typical atrial flutter: Status post ablation 03/19/2020 with repeat ablation 10/25/2022.  He is continue to have short episodes of atrial fibrillation and flutter.  He is post ILR implant.  He had wound dehiscence and reported to the emergency room.  He has 2 stitches in place.  The wound appears well-healed.  We Joanette Silveria bring him back at a later date for suture removal.  2.  Coronary disease: Post CABG.  Occluded radial to the OM.  No current chest pain.  3.  Hypertension: Currently well-controlled  4.  Hyperlipidemia: Continue Repatha per lipid clinic  5.  Obstructive sleep apnea: Treated with neurostimulator.  Followed by sleep medicine.  6.  Second hypercoagulable  state: Currently on Eliquis for    Current medicines are reviewed at length with the patient today.   The patient does not have concerns regarding his medicines.  The following changes were made today: None  Labs/ tests ordered today include:  No orders of the defined types were placed in this encounter.    Disposition:   FU 6 months  Signed, Jimmy Plessinger Jorja Loa, MD  02/18/2023 9:20 AM     [facility] [address] [place] [address] [phone] (office) [phone] (fax)

## 2023-02-18 NOTE — Telephone Encounter (Signed)
Medication list reviewed in anticipation of upcoming Tikosyn initiation. Patient is not taking any contraindicated medications. Patient is taking 1 QTc prolonging medications, albuterol. This is an as needed medication and QTc should be monitored closely.  Patient is anticoagulated on Xarelto on the appropriate dose. Please ensure that patient has not missed any anticoagulation doses in the 3 weeks prior to Tikosyn initiation.   Patient will need to be counseled to avoid use of Benadryl while on Tikosyn and in the 2-3 days prior to Tikosyn initiation.  Please ensure K is between 4-5.2 and Mag is at least 2 prior to starting Tikosyn especially since patient is on furosemide.

## 2023-02-22 ENCOUNTER — Telehealth: Payer: Self-pay

## 2023-02-22 NOTE — Telephone Encounter (Signed)
Transition Care Management Unsuccessful Follow-up Telephone Call  Date of discharge and from where:  02/15/2023 [facility]  Attempts:  1st Attempt  Reason for unsuccessful TCM follow-up call:  Left voice message  [name]  [facility] Population Health Community Resource Care Guide   [email or web address]  ?? [phone]   Website: [email or web address]  [email or web address]

## 2023-02-24 ENCOUNTER — Ambulatory Visit: Payer: PPO | Attending: Interventional Cardiology

## 2023-02-24 ENCOUNTER — Telehealth: Payer: Self-pay

## 2023-02-24 DIAGNOSIS — I4819 Other persistent atrial fibrillation: Secondary | ICD-10-CM

## 2023-02-24 NOTE — Telephone Encounter (Signed)
Transition Care Management Unsuccessful Follow-up Telephone Call  Date of discharge and from where:  02/15/2023 [facility]  Attempts:  2nd Attempt  Reason for unsuccessful TCM follow-up call:  Unable to reach patient  [name] Brown [facility]  [facility] Population Health Community Resource Care Guide   [email or web address]  ?? [phone]   Website: [email or web address]  [email or web address]

## 2023-02-25 NOTE — Progress Notes (Signed)
Patient in today for suture removal at ILR insertion site.  Wound has healed WNL.  No signs of bleeding, wound edges well approximated.  No sign of infection.  2 sutures removed from site without difficulty.  No further wound care instructions needed.  If any concerns develop, patient instructed to contact office. Patient has been set up with remote monitoring.

## 2023-03-12 ENCOUNTER — Other Ambulatory Visit: Payer: Self-pay | Admitting: Internal Medicine

## 2023-03-14 NOTE — Telephone Encounter (Signed)
Due for follow up/physical should schedule for further refills

## 2023-03-22 ENCOUNTER — Inpatient Hospital Stay (HOSPITAL_COMMUNITY)
Admission: AD | Admit: 2023-03-22 | Discharge: 2023-03-25 | DRG: 310 | Disposition: A | Discharge status: Home / Self Care | Payer: PPO | Attending: Cardiology | Admitting: Cardiology

## 2023-03-22 ENCOUNTER — Ambulatory Visit (HOSPITAL_COMMUNITY)
Admission: RE | Admit: 2023-03-22 | Discharge: 2023-03-22 | Disposition: A | Discharge status: Home / Self Care | Payer: PPO | Source: Ambulatory Visit | Attending: Internal Medicine | Admitting: Internal Medicine

## 2023-03-22 ENCOUNTER — Other Ambulatory Visit: Payer: Self-pay

## 2023-03-22 ENCOUNTER — Encounter (HOSPITAL_COMMUNITY): Payer: Self-pay | Admitting: Internal Medicine

## 2023-03-22 VITALS — BP 132/72 | HR 65 | Ht 70.0 in | Wt 189.6 lb

## 2023-03-22 DIAGNOSIS — Z888 Allergy status to other drugs, medicaments and biological substances status: Secondary | ICD-10-CM

## 2023-03-22 DIAGNOSIS — Z8719 Personal history of other diseases of the digestive system: Secondary | ICD-10-CM

## 2023-03-22 DIAGNOSIS — I10 Essential (primary) hypertension: Secondary | ICD-10-CM | POA: Diagnosis not present

## 2023-03-22 DIAGNOSIS — Z951 Presence of aortocoronary bypass graft: Secondary | ICD-10-CM

## 2023-03-22 DIAGNOSIS — I4819 Other persistent atrial fibrillation: Secondary | ICD-10-CM

## 2023-03-22 DIAGNOSIS — R9431 Abnormal electrocardiogram [ECG] [EKG]: Secondary | ICD-10-CM | POA: Diagnosis not present

## 2023-03-22 DIAGNOSIS — Z8249 Family history of ischemic heart disease and other diseases of the circulatory system: Secondary | ICD-10-CM

## 2023-03-22 DIAGNOSIS — E785 Hyperlipidemia, unspecified: Secondary | ICD-10-CM | POA: Diagnosis not present

## 2023-03-22 DIAGNOSIS — I252 Old myocardial infarction: Secondary | ICD-10-CM | POA: Diagnosis not present

## 2023-03-22 DIAGNOSIS — Z79899 Other long term (current) drug therapy: Secondary | ICD-10-CM | POA: Diagnosis not present

## 2023-03-22 DIAGNOSIS — Z87891 Personal history of nicotine dependence: Secondary | ICD-10-CM | POA: Diagnosis not present

## 2023-03-22 DIAGNOSIS — Z95828 Presence of other vascular implants and grafts: Secondary | ICD-10-CM

## 2023-03-22 DIAGNOSIS — G4733 Obstructive sleep apnea (adult) (pediatric): Secondary | ICD-10-CM | POA: Diagnosis present

## 2023-03-22 DIAGNOSIS — Z7982 Long term (current) use of aspirin: Secondary | ICD-10-CM | POA: Diagnosis not present

## 2023-03-22 DIAGNOSIS — R001 Bradycardia, unspecified: Secondary | ICD-10-CM | POA: Diagnosis not present

## 2023-03-22 DIAGNOSIS — I2510 Atherosclerotic heart disease of native coronary artery without angina pectoris: Secondary | ICD-10-CM | POA: Diagnosis not present

## 2023-03-22 DIAGNOSIS — Z7901 Long term (current) use of anticoagulants: Secondary | ICD-10-CM

## 2023-03-22 LAB — MAGNESIUM: Magnesium: 2.1 mg/dL (ref 1.7–2.4)

## 2023-03-22 LAB — BASIC METABOLIC PANEL
Anion gap: 10 (ref 5–15)
BUN: 20 mg/dL (ref 8–23)
CO2: 25 mmol/L (ref 22–32)
Calcium: 9.3 mg/dL (ref 8.9–10.3)
Chloride: 103 mmol/L (ref 98–111)
Creatinine, Ser: 1.12 mg/dL (ref 0.61–1.24)
GFR, Estimated: >60 mL/min (ref >60)
Glucose, Bld: 82 mg/dL (ref 70–99)
Potassium: 4 mmol/L (ref 3.5–5.1)
Sodium: 138 mmol/L (ref 135–145)

## 2023-03-22 LAB — GLUCOSE, CAPILLARY: Glucose-Capillary: 133 mg/dL — ABNORMAL HIGH (ref 70–99)

## 2023-03-22 MED ORDER — DIVALPROEX SODIUM ER 500 MG PO TB24
500.0000 mg | ORAL_TABLET | Freq: Every evening | ORAL | Status: DC
  Administered 2023-03-22 – 2023-03-24 (×3): 500 mg via ORAL
  Filled 2023-03-22 (×4): qty 1

## 2023-03-22 MED ORDER — DOFETILIDE 500 MCG PO CAPS
500.0000 ug | ORAL_CAPSULE | Freq: Two times a day (BID) | ORAL | Status: DC
  Administered 2023-03-22 – 2023-03-23 (×3): 500 ug via ORAL
  Filled 2023-03-22 (×3): qty 1

## 2023-03-22 MED ORDER — ACETAMINOPHEN 500 MG PO TABS
1000.0000 mg | ORAL_TABLET | ORAL | Status: DC | PRN
  Administered 2023-03-23 – 2023-03-24 (×3): 1000 mg via ORAL
  Filled 2023-03-22 (×3): qty 2

## 2023-03-22 MED ORDER — SODIUM CHLORIDE 0.9% FLUSH
3.0000 mL | Freq: Two times a day (BID) | INTRAVENOUS | Status: DC
  Administered 2023-03-22 – 2023-03-25 (×5): 3 mL via INTRAVENOUS

## 2023-03-22 MED ORDER — GUAIFENESIN ER 600 MG PO TB12
600.0000 mg | ORAL_TABLET | Freq: Two times a day (BID) | ORAL | Status: DC
  Administered 2023-03-22 – 2023-03-25 (×6): 600 mg via ORAL
  Filled 2023-03-22 (×6): qty 1

## 2023-03-22 MED ORDER — FUROSEMIDE 20 MG PO TABS
20.0000 mg | ORAL_TABLET | Freq: Two times a day (BID) | ORAL | Status: DC
  Administered 2023-03-22 – 2023-03-25 (×6): 20 mg via ORAL
  Filled 2023-03-22 (×6): qty 1

## 2023-03-22 MED ORDER — CYCLOBENZAPRINE HCL 10 MG PO TABS: 5.0000 mg | ORAL_TABLET | Freq: Three times a day (TID) | ORAL | Status: DC | PRN

## 2023-03-22 MED ORDER — SODIUM CHLORIDE 0.9% FLUSH: 3.0000 mL | INTRAVENOUS | Status: DC | PRN

## 2023-03-22 MED ORDER — RAMIPRIL 5 MG PO CAPS
10.0000 mg | ORAL_CAPSULE | Freq: Every evening | ORAL | Status: DC
  Administered 2023-03-22 – 2023-03-24 (×3): 10 mg via ORAL
  Filled 2023-03-22 (×4): qty 2

## 2023-03-22 MED ORDER — DOXYCYCLINE HYCLATE 100 MG PO TABS
100.0000 mg | ORAL_TABLET | Freq: Every evening | ORAL | Status: DC
  Administered 2023-03-22 – 2023-03-24 (×3): 100 mg via ORAL
  Filled 2023-03-22 (×3): qty 1

## 2023-03-22 MED ORDER — POTASSIUM CHLORIDE CRYS ER 20 MEQ PO TBCR
20.0000 meq | EXTENDED_RELEASE_TABLET | Freq: Every morning | ORAL | Status: DC
  Administered 2023-03-23 – 2023-03-25 (×3): 20 meq via ORAL
  Filled 2023-03-22 (×3): qty 1

## 2023-03-22 MED ORDER — HYDRALAZINE HCL 25 MG PO TABS
25.0000 mg | ORAL_TABLET | Freq: Two times a day (BID) | ORAL | Status: DC
  Administered 2023-03-22 – 2023-03-25 (×6): 25 mg via ORAL
  Filled 2023-03-22 (×6): qty 1

## 2023-03-22 MED ORDER — EZETIMIBE 10 MG PO TABS
10.0000 mg | ORAL_TABLET | Freq: Every morning | ORAL | Status: DC
  Administered 2023-03-23 – 2023-03-25 (×3): 10 mg via ORAL
  Filled 2023-03-22 (×3): qty 1

## 2023-03-22 MED ORDER — ISOSORBIDE MONONITRATE ER 30 MG PO TB24
30.0000 mg | ORAL_TABLET | Freq: Every morning | ORAL | Status: DC
  Administered 2023-03-23 – 2023-03-25 (×3): 30 mg via ORAL
  Filled 2023-03-22 (×3): qty 1

## 2023-03-22 MED ORDER — MELATONIN 5 MG PO TABS
5.0000 mg | ORAL_TABLET | Freq: Every day | ORAL | Status: DC
  Administered 2023-03-22 – 2023-03-24 (×3): 5 mg via ORAL
  Filled 2023-03-22 (×3): qty 1

## 2023-03-22 MED ORDER — ALBUTEROL SULFATE (2.5 MG/3ML) 0.083% IN NEBU: 2.5000 mg | INHALATION_SOLUTION | Freq: Four times a day (QID) | RESPIRATORY_TRACT | Status: DC | PRN

## 2023-03-22 MED ORDER — ASPIRIN 81 MG PO TBEC
81.0000 mg | DELAYED_RELEASE_TABLET | Freq: Every morning | ORAL | Status: DC
  Administered 2023-03-23 – 2023-03-25 (×3): 81 mg via ORAL
  Filled 2023-03-22 (×3): qty 1

## 2023-03-22 MED ORDER — GABAPENTIN 300 MG PO CAPS
300.0000 mg | ORAL_CAPSULE | Freq: Two times a day (BID) | ORAL | Status: DC
  Administered 2023-03-22 – 2023-03-25 (×6): 300 mg via ORAL
  Filled 2023-03-22 (×6): qty 1

## 2023-03-22 MED ORDER — ZOLPIDEM TARTRATE 5 MG PO TABS
5.0000 mg | ORAL_TABLET | Freq: Every evening | ORAL | Status: DC | PRN
  Administered 2023-03-22: 5 mg via ORAL
  Filled 2023-03-22: qty 1

## 2023-03-22 MED ORDER — RIVAROXABAN 20 MG PO TABS
20.0000 mg | ORAL_TABLET | Freq: Every day | ORAL | Status: DC
  Administered 2023-03-22 – 2023-03-24 (×3): 20 mg via ORAL
  Filled 2023-03-22 (×3): qty 1

## 2023-03-22 MED ORDER — MONTELUKAST SODIUM 10 MG PO TABS
10.0000 mg | ORAL_TABLET | Freq: Every day | ORAL | Status: DC
  Administered 2023-03-22 – 2023-03-24 (×3): 10 mg via ORAL
  Filled 2023-03-22 (×3): qty 1

## 2023-03-22 MED ORDER — TAMSULOSIN HCL 0.4 MG PO CAPS
0.4000 mg | ORAL_CAPSULE | Freq: Every evening | ORAL | Status: DC
  Administered 2023-03-22 – 2023-03-24 (×3): 0.4 mg via ORAL
  Filled 2023-03-22 (×3): qty 1

## 2023-03-22 MED ORDER — SODIUM CHLORIDE 0.9 % IV SOLN: 250.0000 mL | INTRAVENOUS | Status: DC | PRN

## 2023-03-22 MED ORDER — NITROGLYCERIN 0.4 MG SL SUBL: 0.4000 mg | SUBLINGUAL_TABLET | SUBLINGUAL | Status: DC | PRN

## 2023-03-22 NOTE — Progress Notes (Signed)
Pharmacy: Dofetilide (Tikosyn) - Initial Consult Assessment and Electrolyte Replacement  Pharmacy consulted to assist in monitoring and replacing electrolytes in this 72 y.o. male admitted on 03/22/2023 undergoing dofetilide initiation. First dofetilide dose: planned 6/11 2000  Assessment:  Patient Exclusion Criteria: If any screening criteria checked as "Yes", then  patient  should NOT receive dofetilide until criteria item is corrected.  If "Yes" please indicate correction plan.  YES  NO Patient  Exclusion Criteria Correction Plan   [x]   []   Baseline QTc interval is greater than or equal to 440 msec. IF above YES box checked dofetilide contraindicated unless patient has ICD; then may proceed if QTc 500-550 msec or with known ventricular conduction abnormalities may proceed with QTc 550-600 msec. QTc =  447 (MD aware)    []   [x]   Patient is known or suspected to have a digoxin level greater than 2 ng/ml: No results found for: "DIGOXIN"     []   [x]   Creatinine clearance less than 20 ml/min (calculated using Cockcroft-Gault, actual body weight and serum creatinine): Estimated Creatinine Clearance: 62.5 mL/min (by C-G formula based on SCr of 1.12 mg/dL).     []   [x]  Patient has received drugs known to prolong the QT intervals within the last 48 hours (phenothiazines, tricyclics or tetracyclic antidepressants, erythromycin, H-1 antihistamines, cisapride, fluoroquinolones, azithromycin, ondansetron).   Updated information on QT prolonging agents is available to be searched on the following database:QT prolonging agents     []   [x]   Patient received a dose of hydrochlorothiazide (Oretic) alone or in any combination including triamterene (Dyazide, Maxzide) in the last 48 hours.    []   [x]  Patient received a medication known to increase dofetilide plasma concentrations prior to initial dofetilide dose:  Trimethoprim (Primsol, Proloprim) in the last 36 hours Verapamil (Calan,  Verelan) in the last 36 hours or a sustained release dose in the last 72 hours Megestrol (Megace) in the last 5 days  Cimetidine (Tagamet) in the last 6 hours Ketoconazole (Nizoral) in the last 24 hours Itraconazole (Sporanox) in the last 48 hours  Prochlorperazine (Compazine) in the last 36 hours     []   [x]   Patient is known to have a history of torsades de pointes; congenital or acquired long QT syndromes.    []   [x]   Patient has received a Class 1 antiarrhythmic with less than 2 half-lives since last dose. (Disopyramide, Quinidine, Procainamide, Lidocaine, Mexiletine, Flecainide, Propafenone)    []   [x]   Patient has received amiodarone therapy in the past 3 months or amiodarone level is greater than 0.3 ng/ml.    Labs:    Component Value Date/Time   K 4.0 03/22/2023 1159   MG 2.1 03/22/2023 1159     Plan: Select One Calculated CrCl  Dose q12h  [x]  > 60 ml/min 500 mcg  []  40-60 ml/min 250 mcg  []  20-40 ml/min 125 mcg   [x]   Physician selected initial dose within range recommended for patients level of renal function - will monitor for response.  []   Physician selected initial dose outside of range recommended for patients level of renal function - will discuss if the dose should be altered at this time.   Patient has been appropriately anticoagulated with  Xarelto.  Potassium: K >/= 4: Appropriate to initiate Tikosyn, no replacement needed    Magnesium: Mg >2: Appropriate to initiate Tikosyn, no replacement needed     Thank you for allowing pharmacy to participate in this patient's care   [name] Brown  [name] Brown, PharmD, BCPS Please see amion for complete clinical pharmacist phone list 03/22/2023  3:27 PM

## 2023-03-22 NOTE — Progress Notes (Signed)
Primary Care Physician: Myrlene Broker, MD Primary Cardiologist: Dr Eden Emms Primary Electrophysiologist: Dr [name] Referring Physician: Dr Emily Filbert is a 72 y.o. male with a history of coronary artery disease status post three-vessel CABG, hypertension, OSA, hyperlipidemia, and atrial fibrillation.  He was found to be in atrial fibrillation incidentally on office visit 08/30/2017.  He was put on Eliquis at the time.  Beta-blockers were not started due to resting bradycardia.  January 2020 he was noted increasing dyspnea on exertion.  He had a cardioversion 11/28/18 and was started on amiodarone. He does report today that he was diagnosed with OSA a few years ago but could not tolerate CPAP therapy. He is on Eliquis for a CHADS2VASC score of 3. Patient is s/p afib ablation 03/19/20. He had recurrent persistent afib and is s/p DCCV on 01/13/21. He initially did well but went back into persistent afib and underwent repeat ablation with Dr [name] on 10/25/22.  On follow up today, patient has been in persistent afib on his Kardia mobile device. Also in afib when he saw Dr Eden Emms on 12/08/22. He is scheduled for DCCV on 01/04/23. No bleeding issues on anticoagulation.   On follow up 03/22/23, he is here today for Tikosyn admission. No recent benadryl use. No missed doses of Xarelto. No new medications since pharmacist review.    Today, he denies symptoms of palpitations, chest pain, orthopnea, PND, lower extremity edema, dizziness, presyncope, syncope, bleeding, or neurologic sequela. The patient is tolerating medications without difficulties and is otherwise without complaint today.    Atrial Fibrillation Risk Factors:  he does have symptoms or diagnosis of sleep apnea. he has an Doctor, hospital.  he does not have a history of rheumatic fever. he does not have a history of alcohol use. The patient does not have a history of early familial atrial fibrillation or other  arrhythmias.  he has a BMI of Body mass index is 27.2 kg/m.Marland Kitchen Filed Weights   03/22/23 1046  Weight: 86 kg     Family History  Problem Relation Age of Onset   Heart attack Mother    Heart disease Mother    Heart attack Father    Heart disease Father    Rectal cancer Neg Hx    Stomach cancer Neg Hx    Pancreatic cancer Neg Hx    Esophageal cancer Neg Hx    Colon polyps Neg Hx    Colon cancer Neg Hx     Atrial Fibrillation Management history:  Previous antiarrhythmic drugs: amiodarone Previous cardioversions: 11/2018, 01/13/21 Previous ablations: 03/19/20, 10/25/22 CHADS2VASC score: 3 Anticoagulation history: Eliquis   Past Medical History:  Diagnosis Date   Aortic aneurysm (HCC) Endograf 2009   Arthritis    Atrial fibrillation (HCC)    Bradycardia    CAD (coronary artery disease) CABG 03/2000   Median sternotomy for coronary artery bypass grafting x 3 (left   HTN (hypertension)    Hx of CABG 2001   severe 3 vessel disease   Hx of colonic polyp 06/2010   Hyperplastic    Hyperlipidemia    Myocardial infarction [facility])    Sleep apnea    Past Surgical History:  Procedure Laterality Date   ATRIAL FIBRILLATION ABLATION N/A 03/19/2020   Procedure: ATRIAL FIBRILLATION ABLATION;  Surgeon: Regan Lemming, MD;  Location: [facility] INVASIVE CV LAB;  Service: Cardiovascular;  Laterality: N/A;   ATRIAL FIBRILLATION ABLATION N/A 10/25/2022   Procedure: ATRIAL FIBRILLATION ABLATION;  Surgeon: [name],  Andree Coss, MD;  Location: [facility] INVASIVE CV LAB;  Service: Cardiovascular;  Laterality: N/A;   CARDIOVERSION N/A 11/28/2018   Procedure: CARDIOVERSION;  Surgeon: Parke Poisson, MD;  Location: [facility] ENDOSCOPY;  Service: Cardiovascular;  Laterality: N/A;   CARDIOVERSION N/A 01/13/2021   Procedure: CARDIOVERSION;  Surgeon: Chrystie Nose, MD;  Location: [facility] ENDOSCOPY;  Service: Cardiovascular;  Laterality: N/A;   CARDIOVERSION N/A 01/04/2023   Procedure: CARDIOVERSION;  Surgeon: Meriam Sprague, MD;  Location: [facility] ENDOSCOPY;  Service: Cardiovascular;  Laterality: N/A;   COLONOSCOPY     CORONARY ARTERY BYPASS GRAFT     AAA stent graft on 12/01/2007 at [facility]  coronary bypass in 2001. Dr. Cornelius Moras.   DRUG INDUCED ENDOSCOPY Right 02/18/2020   Procedure: DRUG INDUCED ENDOSCOPY;  Surgeon: Christia Reading, MD;  Location: [facility];  Service: ENT;  Laterality: Right;   FOOT SURGERY Left 07/19/2017   IMPLANTATION OF HYPOGLOSSAL NERVE STIMULATOR Right 07/30/2020   Procedure: IMPLANTATION OF HYPOGLOSSAL NERVE STIMULATOR Implantation of Chest wall respirator Sensor Electrode Electronic Analysis of Implanted Neurostimulator Pulse Generator;  Surgeon: Christia Reading, MD;  Location: [facility];  Service: ENT;  Laterality: Right;   INGUINAL HERNIA REPAIR Bilateral 08/06/2022   Procedure: LAPAROSCOPIC BILATERAL INGUINAL HERNIA REPAIR WITH MESH AND PRIMARY CLOSURE OF UMBILICAL HERNIA;  Surgeon: Quentin Ore, MD;  Location: [facility] ORS;  Service: General;  Laterality: Bilateral;   LEFT HEART CATHETERIZATION WITH CORONARY ANGIOGRAM N/A 10/26/2013   Procedure: LEFT HEART CATHETERIZATION WITH CORONARY ANGIOGRAM;  Surgeon: Peter M Swaziland, MD;  Location: [facility] CATH LAB;  Service: Cardiovascular;  Laterality: N/A;   MASS EXCISION Right 08/10/2019   Procedure: EXCISION OF CYST FROM RIGHT SCALP;  Surgeon: Chevis Pretty III, MD;  Location: [facility];  Service: General;  Laterality: Right;    Current Outpatient Medications  Medication Sig Dispense Refill   acetaminophen (TYLENOL) 500 MG tablet Take 1,000 mg by mouth every 4 (four) hours as needed for moderate pain.     albuterol (VENTOLIN HFA) 108 (90 Base) MCG/ACT inhaler INHALE 2 PUFFS INTO THE LUNGS EVERY 6 HOURS AS NEEDED FOR WHEEZING OR SHORTNESS OF BREATH (Patient taking differently: Inhale 2 puffs into the lungs See admin instructions. Inhale 2 puffs at bedtime, may inhale 2 puffs every 6  hours as needed for shortness of breath) 54 g 1   aspirin EC 81 MG EC tablet Take 1 tablet (81 mg total) by mouth daily. (Patient taking differently: Take 81 mg by mouth in the morning.)     Biotin 82956 MCG TABS Take 10,000 mcg by mouth in the morning.     cholecalciferol (VITAMIN D) 25 MCG (1000 UT) tablet Take 1,000 Units by mouth every evening.     cyclobenzaprine (FLEXERIL) 5 MG tablet Take 1 tablet (5 mg total) by mouth 3 (three) times daily as needed for muscle spasms. 30 tablet 1   diclofenac sodium (VOLTAREN) 1 % GEL Apply 2 g topically 4 (four) times daily. (Patient taking differently: Apply 2 g topically 4 (four) times daily as needed (pain).) 100 g 3   divalproex (DEPAKOTE ER) 500 MG 24 hr tablet TAKE 1 TABLET(500 MG) BY MOUTH DAILY (Patient taking differently: Take 500 mg by mouth every evening.) 90 tablet 1   doxycycline (VIBRAMYCIN) 100 MG capsule Take 100 mg by mouth every evening.     Evolocumab (REPATHA SURECLICK) 140 MG/ML SOAJ ADMINISTER 1 ML UNDER THE SKIN EVERY 14 DAYS 6  mL 3   ezetimibe (ZETIA) 10 MG tablet TAKE 1 TABLET(10 MG) BY MOUTH DAILY (Patient taking differently: Take 10 mg by mouth in the morning.) 90 tablet 3   furosemide (LASIX) 20 MG tablet Take 1 tablet (20 MG) By mouth twice daily. As needed, patient may take 20 MG (1 tablet) additional Lasix PRN by mouth daily x 4 days as directed per [employer] HF Study PRN plan (Patient taking differently: Take 20 mg by mouth 2 (two) times daily. Take 1 tablet (20 MG) By mouth twice daily. As needed, patient may take 20 MG (1 tablet) additional Lasix PRN by mouth daily x 4 days as directed per [employer] HF Study PRN plan) 180 tablet 3   gabapentin (NEURONTIN) 300 MG capsule TAKE 1 CAPSULE(300 MG) BY MOUTH TWICE DAILY 180 capsule 3   Glucosamine-Chondroitin (GLUCOSAMINE CHONDR COMPLEX PO) Take 500 mg by mouth 2 (two) times daily.      guaiFENesin (MUCINEX) 600 MG 12 hr tablet Take 600 mg by mouth 2 (two) times  daily.     hydrALAZINE (APRESOLINE) 25 MG tablet TAKE 1 TABLET BY MOUTH EVERY MORNING AND AT BEDTIME 180 tablet 3   isosorbide mononitrate (IMDUR) 30 MG 24 hr tablet TAKE 1 TABLET BY MOUTH EVERY DAY (Patient taking differently: 30 mg in the morning.) 90 tablet 3   montelukast (SINGULAIR) 10 MG tablet TAKE 1 TABLET(10 MG) BY MOUTH AT BEDTIME 90 tablet 3   Multiple Vitamin (MULTIVITAMIN WITH MINERALS) TABS tablet Take 1 tablet by mouth every evening.     nitroGLYCERIN (NITROSTAT) 0.4 MG SL tablet Place 1 tablet (0.4 mg total) under the tongue every 5 (five) minutes as needed for chest pain. 25 tablet 11   Omega-3 Fatty Acids (FISH OIL) 1200 MG CAPS Take 1,200 mg by mouth 2 (two) times daily.     potassium chloride (KLOR-CON) 10 MEQ tablet As needed patient may take 10 mEq additional Potassium PRN by mouth daily x 4 days as directed per [employer] HF Study PRN plan (Patient taking differently: Take 10 mEq by mouth as needed. As needed patient may take 10 mEq additional Potassium PRN by mouth daily x 4 days as directed per [employer] HF Study PRN plan) 90 tablet 3   potassium chloride SA (KLOR-CON M) 20 MEQ tablet TAKE 1 TABLET BY MOUTH EVERY DAY (Patient taking differently: Take 20 mEq by mouth in the morning.) 90 tablet 3   ramipril (ALTACE) 10 MG capsule TAKE 2 CAPSULES(20 MG) BY MOUTH DAILY (Patient taking differently: Take 10 mg by mouth every evening.) 180 capsule 3   tamsulosin (FLOMAX) 0.4 MG CAPS capsule TAKE 1 CAPSULE(0.4 MG) BY MOUTH DAILY (Patient taking differently: Take 0.4 mg by mouth every evening.) 90 capsule 3   Tetrahydrozoline HCl (VISINE OP) Place 1 drop into both eyes daily as needed (irritation).     vitamin C (ASCORBIC ACID) 500 MG tablet Take 500 mg by mouth daily.     XARELTO 20 MG TABS tablet TAKE 1 TABLET(20 MG) BY MOUTH DAILY WITH SUPPER 90 tablet 3   zolpidem (AMBIEN CR) 12.5 MG CR tablet TAKE 1 TABLET(12.5 MG) BY MOUTH AT BEDTIME AS NEEDED FOR SLEEP (Patient  taking differently: Take 12.5 mg by mouth at bedtime.) 30 tablet 0   No current facility-administered medications for this encounter.    Allergies  Allergen Reactions   Statins     Myalgias with elevated CPKs    Social History   Socioeconomic History  Marital status: Married    Spouse name: Not on file   Number of children: Not on file   Years of education: Not on file   Highest education level: Not on file  Occupational History   Not on file  Tobacco Use   Smoking status: Former    Packs/day: 2.00    Years: 30.00    Additional pack years: 0.00    Total pack years: 60.00    Types: Cigarettes    Quit date: 01/05/1992    Years since quitting: 31.2   Smokeless tobacco: Never   Tobacco comments:    Former smoker quit 19 yrs ago 11/22/22  Vaping Use   Vaping Use: Never used  Substance and Sexual Activity   Alcohol use: No   Drug use: Yes    Types: Marijuana   Sexual activity: Not on file  Other Topics Concern   Not on file  Social History Narrative   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   Social Determinants of Health   Financial Resource Strain: Low Risk  (04/30/2022)   Overall Financial Resource Strain (CARDIA)    Difficulty of Paying Living Expenses: Not hard at all  Food Insecurity: No Food Insecurity (04/30/2022)   Hunger Vital Sign    Worried About Running Out of Food in the Last Year: Never true    Ran Out of Food in the Last Year: Never true  Transportation Needs: No Transportation Needs (04/30/2022)   PRAPARE - Administrator, Civil Service (Medical): No    Lack of Transportation (Non-Medical): No  Physical Activity: Insufficiently Active (04/30/2022)   Exercise Vital Sign    Days of Exercise per Week: 3 days    Minutes of Exercise per Session: 30 min  Stress: No Stress Concern Present (04/30/2022)   [employer] of Occupational Health - Occupational Stress Questionnaire    Feeling of Stress : Not at  all  Social Connections: Moderately Isolated (04/30/2022)   Social Connection and Isolation Panel [NHANES]    Frequency of Communication with Friends and Family: Three times a week    Frequency of Social Gatherings with Friends and Family: Three times a week    Attends Religious Services: Never    Active Member of Clubs or Organizations: No    Attends Banker Meetings: Never    Marital Status: Married  Catering manager Violence: Not At Risk (04/30/2022)   Humiliation, Afraid, Rape, and Kick questionnaire    Fear of Current or Ex-Partner: No    Emotionally Abused: No    Physically Abused: No    Sexually Abused: No     ROS- All systems are reviewed and negative except as per the HPI above.  Physical Exam: Vitals:   03/22/23 1046  BP: 132/72  Pulse: 65  Weight: 86 kg  Height: 5\' 10"  (1.778 m)    GEN- The patient is well appearing, alert and oriented x 3 today.   Head- normocephalic, atraumatic Eyes-  Sclera clear, conjunctiva pink Ears- hearing intact Lungs- Clear to ausculation bilaterally, normal work of breathing Heart- Irregular rate and rhythm, no murmurs, rubs or gallops, PMI not laterally displaced Extremities- no clubbing, cyanosis, or edema MS- no significant deformity or atrophy Skin- no rash or lesion Psych- euthymic mood, full affect Neuro- strength and sensation are intact    Wt Readings from Last 3 Encounters:  03/22/23 86 kg  02/18/23 88 kg  02/15/23 87 kg  EKG today demonstrates  Vent. rate 65 BPM PR interval * ms QRS duration 108 ms QT/QTcB 430/447 ms P-R-T axes * -12 -6 Atrial fibrillation Abnormal ECG When compared with ECG of 04-Jan-2023 08:44, PREVIOUS ECG IS PRESENT  Echo 01/21/20 demonstrated   1. Left ventricular ejection fraction, by estimation, is 55 to 60%. The  left ventricle has normal function. Left ventricular endocardial border  not optimally defined to evaluate regional wall motion. There is mild left   ventricular hypertrophy. Left  ventricular diastolic parameters are consistent with Grade II diastolic  dysfunction (pseudonormalization).   2. Right ventricular systolic function is normal. The right ventricular  size is mildly enlarged. There is mildly elevated pulmonary artery  systolic pressure.   3. Left atrial size was moderately dilated.   4. Right atrial size was mildly dilated.   5. The mitral valve is normal in structure. Mild mitral valve  regurgitation.   6. The aortic valve is tricuspid. Aortic valve regurgitation is mild.  Mild aortic valve sclerosis is present, with no evidence of aortic valve  stenosis.   7. The inferior vena cava is normal in size with greater than 50%  respiratory variability, suggesting right atrial pressure of 3 mmHg.   Epic records are reviewed at length today  CHA2DS2-VASc Score = 3  The patient's score is based upon: CHF History: 0 HTN History: 1 Diabetes History: 0 Stroke History: 0 Vascular Disease History: 1 Age Score: 1 Gender Score: 0       ASSESSMENT AND PLAN: 1. Persistent Atrial Fibrillation (ICD10:  I48.19) The patient's CHA2DS2-VASc score is 3, indicating a 3.2% annual risk of stroke.   S/p afib ablation 03/19/20 with repeat ablation 10/25/22  Patient would like to pursue dofetilide and presents for dofetilide admission. Continue Xarelto, states no missed doses in the last 3 weeks. No recent benadryl use PharmD has screened medications QTc in SR 437 ms Labs today show creatinine at 1.12, K+ 4.0 and mag 2.1, CrCl calculated at 74 mL/min  He is eligible for Tikosyn 500 mcg bid dosing.   2. Secondary Hypercoagulable State (ICD10:  D68.69) The patient is at significant risk for stroke/thromboembolism based upon his CHA2DS2-VASc Score of 3.  Continue Xarelto No missed doses  3. HTN Stable, no changes today.  4. Obstructive sleep apnea Inspire device placed 07/30/20 Followed by Dr Jenne Pane and Dr Mayford Knife   5. CAD S/p  CABG. No anginal symptoms.    Patient will go to admissions when room is available.   [place] Bells, PA-C [facility] [address] [place], [place] [address] [phone] 03/22/2023 11:55 AM

## 2023-03-22 NOTE — H&P (Signed)
Primary Care Physician: Myrlene Broker, MD Primary Cardiologist: Dr Eden Emms Primary Electrophysiologist: Dr [name] Referring Physician: Dr Emily Filbert is a 72 y.o. male with a history of coronary artery disease status post three-vessel CABG, hypertension, OSA, hyperlipidemia, and atrial fibrillation.  He was found to be in atrial fibrillation incidentally on office visit 08/30/2017.  He was put on Eliquis at the time.  Beta-blockers were not started due to resting bradycardia.  January 2020 he was noted increasing dyspnea on exertion.  He had a cardioversion 11/28/18 and was started on amiodarone. He does report today that he was diagnosed with OSA a few years ago but could not tolerate CPAP therapy. He is on Eliquis for a CHADS2VASC score of 3. Patient is s/p afib ablation 03/19/20. He had recurrent persistent afib and is s/p DCCV on 01/13/21. He initially did well but went back into persistent afib and underwent repeat ablation with Dr [name] on 10/25/22.  On follow up today, patient has been in persistent afib on his Kardia mobile device. Also in afib when he saw Dr Eden Emms on 12/08/22. He is scheduled for DCCV on 01/04/23. No bleeding issues on anticoagulation.   On follow up 03/22/23, he is here today for Tikosyn admission. No recent benadryl use. No missed doses of Xarelto. No new medications since pharmacist review.    Today, he denies symptoms of palpitations, chest pain, orthopnea, PND, lower extremity edema, dizziness, presyncope, syncope, bleeding, or neurologic sequela. The patient is tolerating medications without difficulties and is otherwise without complaint today.    Atrial Fibrillation Risk Factors:  he does have symptoms or diagnosis of sleep apnea. he has an Doctor, hospital.  he does not have a history of rheumatic fever. he does not have a history of alcohol use. The patient does not have a history of early familial atrial fibrillation or other  arrhythmias.  he has a BMI of There is no height or weight on file to calculate BMI.. There were no vitals filed for this visit.    Family History  Problem Relation Age of Onset   Heart attack Mother    Heart disease Mother    Heart attack Father    Heart disease Father    Rectal cancer Neg Hx    Stomach cancer Neg Hx    Pancreatic cancer Neg Hx    Esophageal cancer Neg Hx    Colon polyps Neg Hx    Colon cancer Neg Hx     Atrial Fibrillation Management history:  Previous antiarrhythmic drugs: amiodarone Previous cardioversions: 11/2018, 01/13/21 Previous ablations: 03/19/20, 10/25/22 CHADS2VASC score: 3 Anticoagulation history: Eliquis   Past Medical History:  Diagnosis Date   Aortic aneurysm [facility]) Endograf 2009   Arthritis    Atrial fibrillation (HCC)    Bradycardia    CAD (coronary artery disease) CABG 03/2000   Median sternotomy for coronary artery bypass grafting x 3 (left   HTN (hypertension)    Hx of CABG 2001   severe 3 vessel disease   Hx of colonic polyp 06/2010   Hyperplastic    Hyperlipidemia    Myocardial infarction [facility])    Sleep apnea    Past Surgical History:  Procedure Laterality Date   ATRIAL FIBRILLATION ABLATION N/A 03/19/2020   Procedure: ATRIAL FIBRILLATION ABLATION;  Surgeon: Regan Lemming, MD;  Location: [facility] INVASIVE CV LAB;  Service: Cardiovascular;  Laterality: N/A;   ATRIAL FIBRILLATION ABLATION N/A 10/25/2022   Procedure: ATRIAL FIBRILLATION ABLATION;  Surgeon: Regan Lemming, MD;  Location: [facility] INVASIVE CV LAB;  Service: Cardiovascular;  Laterality: N/A;   CARDIOVERSION N/A 11/28/2018   Procedure: CARDIOVERSION;  Surgeon: Parke Poisson, MD;  Location: [facility] ENDOSCOPY;  Service: Cardiovascular;  Laterality: N/A;   CARDIOVERSION N/A 01/13/2021   Procedure: CARDIOVERSION;  Surgeon: Chrystie Nose, MD;  Location: [facility] ENDOSCOPY;  Service: Cardiovascular;  Laterality: N/A;   CARDIOVERSION N/A 01/04/2023   Procedure: CARDIOVERSION;   Surgeon: Meriam Sprague, MD;  Location: [facility] ENDOSCOPY;  Service: Cardiovascular;  Laterality: N/A;   COLONOSCOPY     CORONARY ARTERY BYPASS GRAFT     AAA stent graft on 12/01/2007 at [facility]  coronary bypass in 2001. Dr. Cornelius Moras.   DRUG INDUCED ENDOSCOPY Right 02/18/2020   Procedure: DRUG INDUCED ENDOSCOPY;  Surgeon: Christia Reading, MD;  Location: [facility];  Service: ENT;  Laterality: Right;   FOOT SURGERY Left 07/19/2017   IMPLANTATION OF HYPOGLOSSAL NERVE STIMULATOR Right 07/30/2020   Procedure: IMPLANTATION OF HYPOGLOSSAL NERVE STIMULATOR Implantation of Chest wall respirator Sensor Electrode Electronic Analysis of Implanted Neurostimulator Pulse Generator;  Surgeon: Christia Reading, MD;  Location: [facility];  Service: ENT;  Laterality: Right;   INGUINAL HERNIA REPAIR Bilateral 08/06/2022   Procedure: LAPAROSCOPIC BILATERAL INGUINAL HERNIA REPAIR WITH MESH AND PRIMARY CLOSURE OF UMBILICAL HERNIA;  Surgeon: Quentin Ore, MD;  Location: [facility] ORS;  Service: General;  Laterality: Bilateral;   LEFT HEART CATHETERIZATION WITH CORONARY ANGIOGRAM N/A 10/26/2013   Procedure: LEFT HEART CATHETERIZATION WITH CORONARY ANGIOGRAM;  Surgeon: Peter M Swaziland, MD;  Location: [facility] CATH LAB;  Service: Cardiovascular;  Laterality: N/A;   MASS EXCISION Right 08/10/2019   Procedure: EXCISION OF CYST FROM RIGHT SCALP;  Surgeon: Griselda Miner, MD;  Location: [facility];  Service: General;  Laterality: Right;    Current Facility-Administered Medications  Medication Dose Route Frequency Provider Last Rate Last Admin   0.9 %  sodium chloride infusion  250 mL Intravenous PRN Eustace Pen, PA-C       acetaminophen (TYLENOL) tablet 1,000 mg  1,000 mg Oral Q4H PRN Eustace Pen, PA-C       albuterol (PROVENTIL) (2.5 MG/3ML) 0.083% nebulizer solution 2.5 mg  2.5 mg Inhalation Q6H PRN Eustace Pen, PA-C       [START ON 03/23/2023] aspirin EC  tablet 81 mg  81 mg Oral q AM Eustace Pen, PA-C       cyclobenzaprine (FLEXERIL) tablet 5 mg  5 mg Oral TID PRN Eustace Pen, PA-C       divalproex (DEPAKOTE ER) 24 hr tablet 500 mg  500 mg Oral QPM Eustace Pen, PA-C       dofetilide (TIKOSYN) capsule 500 mcg  500 mcg Oral BID Eustace Pen, PA-C       doxycycline (VIBRA-TABS) tablet 100 mg  100 mg Oral QPM Eustace Pen, PA-C       [START ON 03/23/2023] ezetimibe (ZETIA) tablet 10 mg  10 mg Oral q AM Eustace Pen, PA-C       furosemide (LASIX) tablet 20 mg  20 mg Oral BID Eustace Pen, PA-C       guaiFENesin (MUCINEX) 12 hr tablet 600 mg  600 mg Oral BID Eustace Pen, PA-C       hydrALAZINE (APRESOLINE) tablet 25 mg  25 mg Oral BID WC Eustace Pen, PA-C       Melene Muller  ON 03/23/2023] isosorbide mononitrate (IMDUR) 24 hr tablet 30 mg  30 mg Oral q AM Eustace Pen, PA-C       montelukast (SINGULAIR) tablet 10 mg  10 mg Oral QHS Eustace Pen, PA-C       nitroGLYCERIN (NITROSTAT) SL tablet 0.4 mg  0.4 mg Sublingual Q5 min PRN Eustace Pen, PA-C       [START ON 03/23/2023] potassium chloride SA (KLOR-CON M) CR tablet 20 mEq  20 mEq Oral q AM Eustace Pen, PA-C       ramipril (ALTACE) capsule 10 mg  10 mg Oral QPM Eustace Pen, PA-C       rivaroxaban Carlena Hurl) tablet 20 mg  20 mg Oral Q supper Eustace Pen, PA-C       sodium chloride flush (NS) 0.9 % injection 3 mL  3 mL Intravenous Q12H Eustace Pen, PA-C       sodium chloride flush (NS) 0.9 % injection 3 mL  3 mL Intravenous PRN Eustace Pen, PA-C       tamsulosin (FLOMAX) capsule 0.4 mg  0.4 mg Oral QPM Eustace Pen, PA-C        Allergies  Allergen Reactions   Statins     Myalgias with elevated CPKs    Social History   Socioeconomic History   Marital status: Married    Spouse name: Not on file   Number of children: Not on file   Years of education: Not on file   Highest education level: Not on file  Occupational History    Not on file  Tobacco Use   Smoking status: Former    Packs/day: 2.00    Years: 30.00    Additional pack years: 0.00    Total pack years: 60.00    Types: Cigarettes    Quit date: 01/05/1992    Years since quitting: 31.2   Smokeless tobacco: Never   Tobacco comments:    Former smoker quit 19 yrs ago 11/22/22  Vaping Use   Vaping Use: Never used  Substance and Sexual Activity   Alcohol use: No   Drug use: Yes    Types: Marijuana   Sexual activity: Not on file  Other Topics Concern   Not on file  Social History Narrative   He works as a department head in a Production designer, 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   Social Determinants of Health   Financial Resource Strain: Low Risk  (04/30/2022)   Overall Financial Resource Strain (CARDIA)    Difficulty of Paying Living Expenses: Not hard at all  Food Insecurity: No Food Insecurity (04/30/2022)   Hunger Vital Sign    Worried About Running Out of Food in the Last Year: Never true    Ran Out of Food in the Last Year: Never true  Transportation Needs: No Transportation Needs (04/30/2022)   PRAPARE - Administrator, Civil Service (Medical): No    Lack of Transportation (Non-Medical): No  Physical Activity: Insufficiently Active (04/30/2022)   Exercise Vital Sign    Days of Exercise per Week: 3 days    Minutes of Exercise per Session: 30 min  Stress: No Stress Concern Present (04/30/2022)   [employer] of Occupational Health - Occupational Stress Questionnaire    Feeling of Stress : Not at all  Social Connections: Moderately Isolated (04/30/2022)   Social Connection and Isolation Panel [NHANES]    Frequency of Communication with Friends and Family: Three times a week  Frequency of Social Gatherings with Friends and Family: Three times a week    Attends Religious Services: Never    Active Member of Clubs or Organizations: No    Attends Banker Meetings: Never    Marital Status: Married  Catering manager Violence:  Not At Risk (04/30/2022)   Humiliation, Afraid, Rape, and Kick questionnaire    Fear of Current or Ex-Partner: No    Emotionally Abused: No    Physically Abused: No    Sexually Abused: No     ROS- All systems are reviewed and negative except as per the HPI above.  Physical Exam: Vitals:   03/22/23 1522  BP: (!) 153/88  Resp: 16    GEN- The patient is well appearing, alert and oriented x 3 today.   Head- normocephalic, atraumatic Eyes-  Sclera clear, conjunctiva pink Ears- hearing intact Lungs- Clear to ausculation bilaterally, normal work of breathing Heart- Irregular rate and rhythm, no murmurs, rubs or gallops, PMI not laterally displaced Extremities- no clubbing, cyanosis, or edema MS- no significant deformity or atrophy Skin- no rash or lesion Psych- euthymic mood, full affect Neuro- strength and sensation are intact    Wt Readings from Last 3 Encounters:  03/22/23 86 kg  02/18/23 88 kg  02/15/23 87 kg    EKG today demonstrates  Vent. rate 65 BPM PR interval * ms QRS duration 108 ms QT/QTcB 430/447 ms P-R-T axes * -12 -6 Atrial fibrillation Abnormal ECG When compared with ECG of 04-Jan-2023 08:44, PREVIOUS ECG IS PRESENT  Echo 01/21/20 demonstrated   1. Left ventricular ejection fraction, by estimation, is 55 to 60%. The  left ventricle has normal function. Left ventricular endocardial border  not optimally defined to evaluate regional wall motion. There is mild left  ventricular hypertrophy. Left  ventricular diastolic parameters are consistent with Grade II diastolic  dysfunction (pseudonormalization).   2. Right ventricular systolic function is normal. The right ventricular  size is mildly enlarged. There is mildly elevated pulmonary artery  systolic pressure.   3. Left atrial size was moderately dilated.   4. Right atrial size was mildly dilated.   5. The mitral valve is normal in structure. Mild mitral valve  regurgitation.   6. The aortic valve  is tricuspid. Aortic valve regurgitation is mild.  Mild aortic valve sclerosis is present, with no evidence of aortic valve  stenosis.   7. The inferior vena cava is normal in size with greater than 50%  respiratory variability, suggesting right atrial pressure of 3 mmHg.   Epic records are reviewed at length today  CHA2DS2-VASc Score = 3  The patient's score is based upon: CHF History: 0 HTN History: 1 Diabetes History: 0 Stroke History: 0 Vascular Disease History: 1 Age Score: 1 Gender Score: 0       ASSESSMENT AND PLAN: 1. Persistent Atrial Fibrillation (ICD10:  I48.19) The patient's CHA2DS2-VASc score is 3, indicating a 3.2% annual risk of stroke.   S/p afib ablation 03/19/20 with repeat ablation 10/25/22  Patient would like to pursue dofetilide and presents for dofetilide admission. Continue Xarelto, states no missed doses in the last 3 weeks. No recent benadryl use PharmD has screened medications QTc in SR 437 ms Labs today show creatinine at 1.12, K+ 4.0 and mag 2.1, CrCl calculated at 74 mL/min  He is eligible for Tikosyn 500 mcg bid dosing.   2. Secondary Hypercoagulable State (ICD10:  D68.69) The patient is at significant risk for stroke/thromboembolism based upon his CHA2DS2-VASc Score  of 3.  Continue Xarelto No missed doses  3. HTN Stable, no changes today.  4. Obstructive sleep apnea Inspire device placed 07/30/20 Followed by Dr Jenne Pane and Dr Mayford Knife   5. CAD S/p CABG. No anginal symptoms.    Patient [name] Brown go to admissions when room is available.   Lake Bells, PA-C [facility] [address] [place], [place] [address] [phone] 03/22/2023 4:34 PM  I have seen and examined this patient with Jackie Plum.  Agree with above, note added to reflect my findings.  Patient presents today with a history of atrial fibrillation.  He presents for dofetilide load.  He is status post ablation x 2 but has had more frequent episodes of  atrial fibrillation.  He has symptoms of weakness and fatigue.  GEN: Well nourished, well developed, in no acute distress  HEENT: normal  Neck: no JVD, carotid bruits, or masses Cardiac: irregular; no murmurs, rubs, or gallops,no edema  Respiratory:  clear to auscultation bilaterally, normal work of breathing GI: soft, nontender, nondistended, + BS MS: no deformity or atrophy  Skin: warm and dry Neuro:  Strength and sensation are intact Psych: euthymic mood, full affect   Persistent atrial fibrillation: Currently on Xarelto.  Admission today for dofetilide load.  QTc stable.  [name] Brown start with 500 mcg twice daily tonight.  Plan for cardioversion after the fourth dose if he does not convert to sinus rhythm. Coronary disease: No current angina Hypertension: Mildly elevated.  Usually well-controlled.  Continue home medications. Obstructive apnea: CPAP compliance encouraged  Kazimir Hartnett M. Eshika Reckart MD 03/22/2023 4:35 PM

## 2023-03-23 ENCOUNTER — Encounter (HOSPITAL_COMMUNITY): Payer: Self-pay | Admitting: Cardiology

## 2023-03-23 ENCOUNTER — Other Ambulatory Visit (HOSPITAL_COMMUNITY): Payer: Self-pay

## 2023-03-23 ENCOUNTER — Other Ambulatory Visit: Payer: Self-pay

## 2023-03-23 DIAGNOSIS — I4819 Other persistent atrial fibrillation: Secondary | ICD-10-CM | POA: Diagnosis not present

## 2023-03-23 LAB — MAGNESIUM: Magnesium: 2.1 mg/dL (ref 1.7–2.4)

## 2023-03-23 LAB — BASIC METABOLIC PANEL
Anion gap: 9 (ref 5–15)
BUN: 19 mg/dL (ref 8–23)
CO2: 25 mmol/L (ref 22–32)
Calcium: 8.8 mg/dL — ABNORMAL LOW (ref 8.9–10.3)
Chloride: 102 mmol/L (ref 98–111)
Creatinine, Ser: 1.09 mg/dL (ref 0.61–1.24)
GFR, Estimated: >60 mL/min (ref >60)
Glucose, Bld: 91 mg/dL (ref 70–99)
Potassium: 3.8 mmol/L (ref 3.5–5.1)
Sodium: 136 mmol/L (ref 135–145)

## 2023-03-23 MED ORDER — POTASSIUM CHLORIDE CRYS ER 20 MEQ PO TBCR
40.0000 meq | EXTENDED_RELEASE_TABLET | Freq: Once | ORAL | Status: AC
  Administered 2023-03-23: 40 meq via ORAL
  Filled 2023-03-23: qty 2

## 2023-03-23 MED ORDER — SODIUM CHLORIDE 0.9 % IV SOLN: INTRAVENOUS | Status: DC

## 2023-03-23 MED ORDER — HYDROXYZINE HCL 50 MG/ML IM SOLN
25.0000 mg | Freq: Three times a day (TID) | INTRAMUSCULAR | Status: DC | PRN
  Filled 2023-03-23: qty 0.5

## 2023-03-23 MED ORDER — ZOLPIDEM TARTRATE 5 MG PO TABS
12.5000 mg | ORAL_TABLET | Freq: Every evening | ORAL | Status: DC | PRN
  Administered 2023-03-23 – 2023-03-24 (×2): 12.5 mg via ORAL
  Filled 2023-03-23 (×2): qty 3

## 2023-03-23 NOTE — TOC CM/SW Note (Signed)
Transition of Care [facility]) - Inpatient Brief Assessment   Patient Details  Name: [name] Brown MRN: [id number] Date of Birth: [birth year]  Transition of Care [facility]) CM/SW Contact:    Gala Lewandowsky, RN Phone Number: 03/23/2023, 11:57 AM   Clinical Narrative: Transition of Care Department [facility]) has reviewed the patient. Patient presented for Tikosyn Load. Benefits check submitted for cost. Case Manager will discuss cost and pharmacy of choice as the patient progresses.  Transition of Care Asessment: Insurance and Status: Insurance coverage has been reviewed Patient has primary care physician: Yes     Prior/Current Home Services: No current home services Social Determinants of Health Reivew: SDOH reviewed no interventions necessary Readmission risk has been reviewed: Yes Transition of care needs: transition of care needs identified, TOC will continue to follow

## 2023-03-23 NOTE — TOC Benefit Eligibility Note (Signed)
Pharmacy Patient Advocate Encounter  Insurance verification completed.    The patient is insured through [employer]/ [employer] Advance   Ran test claim for dofetilide (Tikosyn) 500 mcg capsules and the current 30 day co-pay is $12.71.   This test claim was processed through [facility]- copay amounts may vary at other pharmacies due to pharmacy/plan contracts, or as the patient moves through the different stages of their insurance plan.    Roland Earl, CPHT Pharmacy Patient Advocate Specialist [facility] Patient Advocate Team Direct Number: [phone]  Fax: [phone]

## 2023-03-23 NOTE — Progress Notes (Addendum)
Rounding Note    Patient Name: [name] Brown Date of Encounter: 03/23/2023  [facility] Cardiologist: Charlton Haws, MD   Subjective   Feels OK, AFib gets him fatigued in the afternoons  Inpatient Medications    Scheduled Meds:  aspirin EC  81 mg Oral q AM   divalproex  500 mg Oral QPM   dofetilide  500 mcg Oral BID   doxycycline  100 mg Oral QPM   ezetimibe  10 mg Oral q AM   furosemide  20 mg Oral BID   gabapentin  300 mg Oral BID   guaiFENesin  600 mg Oral BID   hydrALAZINE  25 mg Oral BID WC   isosorbide mononitrate  30 mg Oral q AM   melatonin  5 mg Oral QHS   montelukast  10 mg Oral QHS   potassium chloride SA  20 mEq Oral q AM   potassium chloride  40 mEq Oral Once   ramipril  10 mg Oral QPM   rivaroxaban  20 mg Oral Q supper   sodium chloride flush  3 mL Intravenous Q12H   tamsulosin  0.4 mg Oral QPM   Continuous Infusions:  sodium chloride     PRN Meds: sodium chloride, acetaminophen, albuterol, cyclobenzaprine, nitroGLYCERIN, sodium chloride flush, zolpidem   Vital Signs    Vitals:   03/22/23 2010 03/23/23 0019 03/23/23 0347 03/23/23 0809  BP: (!) 146/77 115/70 121/70 137/80  Pulse: 60 (!) 49 (!) 47 (!) 55  Resp: 16 16 17 16   Temp: 97.6 F (36.4 C) (!) 97.2 F (36.2 C) 97.6 F (36.4 C) 98 F (36.7 C)  TempSrc: Oral Oral Oral Oral  SpO2: 95% 97% 98% 96%   No intake or output data in the 24 hours ending 03/23/23 0822    03/22/2023   10:46 AM 02/18/2023    8:35 AM 02/15/2023    6:01 AM  Last 3 Weights  Weight (lbs) 189 lb 9.6 oz 194 lb 191 lb 12.8 oz  Weight (kg) 86.002 kg 87.998 kg 87 kg      Telemetry    AFib 50's-60's  - Personally Reviewed  ECG    AFib 49bpm, QTc  - Personally Reviewed with Dr. [name]  Physical Exam   GEN: No acute distress.   Neck: No JVD Cardiac: irreg-irreg, no murmurs, rubs, or gallops.  Respiratory: CTA b/l. GI: Soft, nontender, non-distended  MS: No edema; No deformity. Neuro:   Nonfocal  Psych: Normal affect   Labs    High Sensitivity Troponin:  No results for input(s): "TROPONINIHS" in the last 720 hours.   Chemistry Recent Labs  Lab 03/22/23 1159 03/23/23 0114  NA 138 136  K 4.0 3.8  CL 103 102  CO2 25 25  GLUCOSE 82 91  BUN 20 19  CREATININE 1.12 1.09  CALCIUM 9.3 8.8*  MG 2.1 2.1  GFRNONAA >60 >60  ANIONGAP 10 9    Lipids No results for input(s): "CHOL", "TRIG", "HDL", "LABVLDL", "LDLCALC", "CHOLHDL" in the last 168 hours.  HematologyNo results for input(s): "WBC", "RBC", "HGB", "HCT", "MCV", "MCH", "MCHC", "RDW", "PLT" in the last 168 hours. Thyroid No results for input(s): "TSH", "FREET4" in the last 168 hours.  BNPNo results for input(s): "BNP", "PROBNP" in the last 168 hours.  DDimer No results for input(s): "DDIMER" in the last 168 hours.   Radiology    No results found.  Cardiac Studies    01/21/20: TTE 1. Left ventricular ejection fraction,  by estimation, is 55 to 60%. The  left ventricle has normal function. Left ventricular endocardial border  not optimally defined to evaluate regional wall motion. There is mild left  ventricular hypertrophy. Left  ventricular diastolic parameters are consistent with Grade II diastolic  dysfunction (pseudonormalization).   2. Right ventricular systolic function is normal. The right ventricular  size is mildly enlarged. There is mildly elevated pulmonary artery  systolic pressure.   3. Left atrial size was moderately dilated.   4. Right atrial size was mildly dilated.   5. The mitral valve is normal in structure. Mild mitral valve  regurgitation.   6. The aortic valve is tricuspid. Aortic valve regurgitation is mild.  Mild aortic valve sclerosis is present, with no evidence of aortic valve  stenosis.   7. The inferior vena cava is normal in size with greater than 50%  respiratory variability, suggesting right atrial pressure of 3 mmHg.   Patient Profile     72 y.o. male w/hx of CAD  (CABG), HTN, HLD, PVD followed by Dr Durwin Nora with EVAR  Duplex 05/10/18 no residual AAA or endoleak. 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, OSA (intol of CPAP/BIPAP, hypoglossal N stimulator placement 07/30/20), AFib (w/hx of PVI ablation x2) admitted for Tikosyn  Assessment & Plan    Persistent AFib/typical AFlutter CHA2DS2Vasc is 3, on Xarelto Tikosyn load is in progress K+ 3.8 Mag 2.1 Creat 1.09, stable QTc remains acceptable  DCCV tomorrow if not in SR, pt aware and agreeable  CAD PVD No symptoms Home meds  HTN Home meds    For questions or updates, please contact [facility] Please consult [email or web address] for contact info under        Signed, Sheilah Pigeon, PA-C  03/23/2023, 8:22 AM    I have seen and examined this patient with Francis Dowse.  Agree with above, note added to reflect my findings.  Feeling well.  Remains in atrial fibrillation.  GEN: Well nourished, well developed, in no acute distress  HEENT: normal  Neck: no JVD, carotid bruits, or masses Cardiac: Irregular; no murmurs, rubs, or gallops,no edema  Respiratory:  clear to auscultation bilaterally, normal work of breathing GI: soft, nontender, nondistended, + BS MS: no deformity or atrophy  Skin: warm and dry Neuro:  Strength and sensation are intact Psych: euthymic mood, full affect   Persistent atrial fibrillation/flutter: Dofetilide load in progress.  QTc remained stable.  Likely Philopateer Strine need cardioversion tomorrow if he does not convert to sinus rhythm.  Otherwise we Egon Dittus continue with current management. Coronary artery disease: No current chest pain Peripheral vascular disease: Stable Hypertension: Stable  Namrata Dangler M. Yolunda Kloos MD 03/23/2023 12:36 PM

## 2023-03-23 NOTE — Progress Notes (Signed)
Post dose EKG reviewed  SB 45bpm (with known hx of bradycardia) Reviewed with Dr. [name] QTc remains acceptable  Continue tikosyn  Francis Dowse, PA-C

## 2023-03-23 NOTE — Progress Notes (Addendum)
Pharmacy: Dofetilide (Tikosyn) - Follow Up Assessment and Electrolyte Replacement  Pharmacy consulted to assist in monitoring and replacing electrolytes in this 72 y.o. male admitted on 03/22/2023 undergoing dofetilide initiation. First dofetilide dose: 6/11@2000 .  Labs:    Component Value Date/Time   K 3.8 03/23/2023 0114   MG 2.1 03/23/2023 0114     Plan: Potassium: K 3.8-3.9:  Give KCl 40 mEq po x1  - in addition to prior to admission 20 mEq daily   Magnesium: Mg > 2: No additional supplementation needed   Thank you for allowing pharmacy to participate in this patient's care,  Sherron Monday, PharmD, BCCCP Clinical Pharmacist  Phone: [phone] 03/23/2023 7:30 AM  Please check AMION for all [facility] Pharm[facility] phone numbers After 10:00 PM, call Main Pharmacy [phone]

## 2023-03-24 ENCOUNTER — Encounter (HOSPITAL_COMMUNITY)
Admission: AD | Disposition: A | Discharge status: Home / Self Care | Payer: Self-pay | Source: Home / Self Care | Attending: Cardiology

## 2023-03-24 DIAGNOSIS — I4819 Other persistent atrial fibrillation: Secondary | ICD-10-CM | POA: Diagnosis not present

## 2023-03-24 LAB — BASIC METABOLIC PANEL
Anion gap: 8 (ref 5–15)
BUN: 20 mg/dL (ref 8–23)
CO2: 26 mmol/L (ref 22–32)
Calcium: 9 mg/dL (ref 8.9–10.3)
Chloride: 104 mmol/L (ref 98–111)
Creatinine, Ser: 0.99 mg/dL (ref 0.61–1.24)
GFR, Estimated: >60 mL/min (ref >60)
Glucose, Bld: 87 mg/dL (ref 70–99)
Potassium: 3.7 mmol/L (ref 3.5–5.1)
Sodium: 138 mmol/L (ref 135–145)

## 2023-03-24 LAB — MAGNESIUM: Magnesium: 2.1 mg/dL (ref 1.7–2.4)

## 2023-03-24 SURGERY — CARDIOVERSION
Anesthesia: General

## 2023-03-24 MED ORDER — POTASSIUM CHLORIDE CRYS ER 20 MEQ PO TBCR
60.0000 meq | EXTENDED_RELEASE_TABLET | Freq: Once | ORAL | Status: AC
  Administered 2023-03-24: 60 meq via ORAL
  Filled 2023-03-24: qty 3

## 2023-03-24 MED ORDER — DOFETILIDE 250 MCG PO CAPS
250.0000 ug | ORAL_CAPSULE | Freq: Two times a day (BID) | ORAL | Status: DC
  Administered 2023-03-24 – 2023-03-25 (×3): 250 ug via ORAL
  Filled 2023-03-24 (×3): qty 1

## 2023-03-24 NOTE — Care Management (Signed)
[id number] 03-24-23 Case Manager spoke with the patient regarding co pay cost. Patient is agreeable to cost and would like to have the initial Rx filled via [facility] and the Rx refills 30 day supply escribed to [facility] [address]  No further needs identified at this time.

## 2023-03-24 NOTE — Progress Notes (Signed)
Post dose EKG is reviewed with Dr. [name] QTc is improved OK to continue tikosyn  Francis Dowse, PA-C

## 2023-03-24 NOTE — Progress Notes (Signed)
Pharmacy: Dofetilide (Tikosyn) - Follow Up Assessment and Electrolyte Replacement  Pharmacy consulted to assist in monitoring and replacing electrolytes in this 72 y.o. male admitted on 03/22/2023 undergoing dofetilide initiation. First dofetilide dose: 6/11@2000 .  Labs:    Component Value Date/Time   K 3.7 03/24/2023 0126   MG 2.1 03/24/2023 0126     Plan: Potassium: K 3.5-3.7:  Give KCl 60 mEq po x1  - in addition to prior to admission 20 mEq daily   Magnesium: Mg > 2: No additional supplementation needed   Thank you for allowing pharmacy to participate in this patient's care,  Sherron Monday, PharmD, BCCCP Clinical Pharmacist  Phone: [phone] 03/24/2023 7:20 AM  Please check AMION for all [facility] Pharmacy phone numbers After 10:00 PM, call Main Pharmacy [phone]

## 2023-03-24 NOTE — Progress Notes (Addendum)
Rounding Note    Patient Name: [name] Brown Date of Encounter: 03/23/2023  [facility] Cardiologist: Charlton Haws, MD   Subjective   Feels well, unable  to really tell if he can tell the difference in rhythm yet, no CP, SOB  Inpatient Medications    Scheduled Meds:  aspirin EC  81 mg Oral q AM   divalproex  500 mg Oral QPM   dofetilide  500 mcg Oral BID   doxycycline  100 mg Oral QPM   ezetimibe  10 mg Oral q AM   furosemide  20 mg Oral BID   gabapentin  300 mg Oral BID   guaiFENesin  600 mg Oral BID   hydrALAZINE  25 mg Oral BID WC   isosorbide mononitrate  30 mg Oral q AM   melatonin  5 mg Oral QHS   montelukast  10 mg Oral QHS   potassium chloride SA  20 mEq Oral q AM   potassium chloride  40 mEq Oral Once   ramipril  10 mg Oral QPM   rivaroxaban  20 mg Oral Q supper   sodium chloride flush  3 mL Intravenous Q12H   tamsulosin  0.4 mg Oral QPM   Continuous Infusions:  sodium chloride     PRN Meds: sodium chloride, acetaminophen, albuterol, cyclobenzaprine, nitroGLYCERIN, sodium chloride flush, zolpidem   Vital Signs    Vitals:   03/22/23 2010 03/23/23 0019 03/23/23 0347 03/23/23 0809  BP: (!) 146/77 115/70 121/70 137/80  Pulse: 60 (!) 49 (!) 47 (!) 55  Resp: 16 16 17 16   Temp: 97.6 F (36.4 C) (!) 97.2 F (36.2 C) 97.6 F (36.4 C) 98 F (36.7 C)  TempSrc: Oral Oral Oral Oral  SpO2: 95% 97% 98% 96%   No intake or output data in the 24 hours ending 03/23/23 0822    03/22/2023   10:46 AM 02/18/2023    8:35 AM 02/15/2023    6:01 AM  Last 3 Weights  Weight (lbs) 189 lb 9.6 oz 194 lb 191 lb 12.8 oz  Weight (kg) 86.002 kg 87.998 kg 87 kg      Telemetry    SB/SR high 40's-60's   - Personally Reviewed  ECG    SB52bpm, QT , QTc  - Personally Reviewed with Dr. [name]  Physical Exam   GEN: No acute distress.   Neck: No JVD Cardiac: RRR, no murmurs, rubs, or gallops.  Respiratory: CTA b/l. GI: Soft, nontender, non-distended   MS: No edema; No deformity. Neuro:  Nonfocal  Psych: Normal affect   Labs    High Sensitivity Troponin:  No results for input(s): "TROPONINIHS" in the last 720 hours.   Chemistry Recent Labs  Lab 03/22/23 1159 03/23/23 0114  NA 138 136  K 4.0 3.8  CL 103 102  CO2 25 25  GLUCOSE 82 91  BUN 20 19  CREATININE 1.12 1.09  CALCIUM 9.3 8.8*  MG 2.1 2.1  GFRNONAA >60 >60  ANIONGAP 10 9    Lipids No results for input(s): "CHOL", "TRIG", "HDL", "LABVLDL", "LDLCALC", "CHOLHDL" in the last 168 hours.  HematologyNo results for input(s): "WBC", "RBC", "HGB", "HCT", "MCV", "MCH", "MCHC", "RDW", "PLT" in the last 168 hours. Thyroid No results for input(s): "TSH", "FREET4" in the last 168 hours.  BNPNo results for input(s): "BNP", "PROBNP" in the last 168 hours.  DDimer No results for input(s): "DDIMER" in the last 168 hours.   Radiology    No results found.  Cardiac Studies    01/21/20: TTE 1. Left ventricular ejection fraction, by estimation, is 55 to 60%. The  left ventricle has normal function. Left ventricular endocardial border  not optimally defined to evaluate regional wall motion. There is mild left  ventricular hypertrophy. Left  ventricular diastolic parameters are consistent with Grade II diastolic  dysfunction (pseudonormalization).   2. Right ventricular systolic function is normal. The right ventricular  size is mildly enlarged. There is mildly elevated pulmonary artery  systolic pressure.   3. Left atrial size was moderately dilated.   4. Right atrial size was mildly dilated.   5. The mitral valve is normal in structure. Mild mitral valve  regurgitation.   6. The aortic valve is tricuspid. Aortic valve regurgitation is mild.  Mild aortic valve sclerosis is present, with no evidence of aortic valve  stenosis.   7. The inferior vena cava is normal in size with greater than 50%  respiratory variability, suggesting right atrial pressure of 3 mmHg.   Patient  Profile     72 y.o. male w/hx of CAD (CABG), HTN, HLD, PVD followed by Dr Durwin Nora with EVAR  Duplex 05/10/18 no residual AAA or endoleak. 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 Bradycardia Asymptomatic    For questions or updates, please contact [facility] Please consult [email or web address] for contact info under        Signed, Sheilah Pigeon, PA-C  03/23/2023, 8:22 AM    I have seen and examined this patient with Francis Dowse.  Agree with above, note added to reflect my findings.  Converted to sinus rhythm yesterday.  No acute complaints.  GEN: Well nourished, well developed, in no acute distress  HEENT: normal  Neck: no JVD, carotid bruits, or masses Cardiac: RRR; no murmurs, rubs, or gallops,no edema  Respiratory:  clear to auscultation bilaterally, normal work of breathing GI: soft, nontender, nondistended, + BS MS: no deformity or atrophy  Skin: warm and dry Neuro:  Strength and sensation are intact Psych: euthymic mood, full affect   Persistent atrial fibrillation/flutter: Currently in sinus rhythm.  Converted with dofetilide.  QTc became prolonged.  Dymir Neeson reduce dofetilide to 250 mcg twice daily.  Shiquita Collignon supplement potassium.  Likely discharge tomorrow pending QTc.  Noble Bodie M. Demauri Advincula MD 03/24/2023 5:44 PM

## 2023-03-24 NOTE — Progress Notes (Signed)
Pt converted to NSR this a.m. 12-lead EKG obtained to confirm.   Bari Edward, RN

## 2023-03-25 ENCOUNTER — Other Ambulatory Visit (HOSPITAL_COMMUNITY): Payer: Self-pay

## 2023-03-25 DIAGNOSIS — I4819 Other persistent atrial fibrillation: Secondary | ICD-10-CM | POA: Diagnosis not present

## 2023-03-25 LAB — BASIC METABOLIC PANEL
Anion gap: 8 (ref 5–15)
BUN: 26 mg/dL — ABNORMAL HIGH (ref 8–23)
CO2: 25 mmol/L (ref 22–32)
Calcium: 9.1 mg/dL (ref 8.9–10.3)
Chloride: 105 mmol/L (ref 98–111)
Creatinine, Ser: 1.26 mg/dL — ABNORMAL HIGH (ref 0.61–1.24)
GFR, Estimated: >60 mL/min (ref >60)
Glucose, Bld: 86 mg/dL (ref 70–99)
Potassium: 4.1 mmol/L (ref 3.5–5.1)
Sodium: 138 mmol/L (ref 135–145)

## 2023-03-25 LAB — MAGNESIUM: Magnesium: 2.2 mg/dL (ref 1.7–2.4)

## 2023-03-25 MED ORDER — DOFETILIDE 250 MCG PO CAPS
250.0000 ug | ORAL_CAPSULE | Freq: Two times a day (BID) | ORAL | 5 refills | Status: DC
  Filled 2023-03-25: qty 60, 30d supply, fill #0

## 2023-03-25 NOTE — Progress Notes (Addendum)
Discharge instructions reviewed with pt and his wife.  Copy of instructions given to pt. [facility] filled pt's tikosyn script and med will be picked up at this pharmacy on the way to the main entrance. Pt and wife verbalized understanding of instructions and importance of taking tikosyn 12hr apart.  Pt d/c'd via wheelchair with belongings, with wife.              Escorted by staff.   Annice Needy, RN SWOT

## 2023-03-25 NOTE — Discharge Summary (Addendum)
ELECTROPHYSIOLOGY PROCEDURE DISCHARGE SUMMARY    Patient ID: CYPRIAN NAASZ,  MRN: [id number], DOB/AGE: 07-02-1951 72 y.o.  Admit date: 03/22/2023 Discharge date: 03/25/2023  Primary Care Physician: Myrlene Broker, MD  Primary Cardiologist: Dr. Eden Emms Electrophysiologist: Dr. [name]  Primary Discharge Diagnosis:  1.  persistent atrial fibrillation status post Tikosyn loading this admission      CHA2DS2Vasc is 3, on Xarelto  Secondary Discharge Diagnosis:  CAD HTN PVD AAA OSA Asymptomatic SB  Allergies  Allergen Reactions   Statins     Myalgias with elevated CPKs     Procedures This Admission:  1.  Tikosyn loading   Brief HPI: DANILE GRONEMEYER is a 72 y.o. male with a past medical history as noted above. He is followed by EP in the out patient setting for his atrial fibrillation.  Risks, benefits, and alternatives to Tikosyn were reviewed with the patient who wished to proceed.    Hospital Course:  The patient was admitted and Tikosyn was initiated.  Renal function and electrolytes were followed during the hospitalization.  The patient's QTc did lengthen though stabilized on reduced dose.  He did not require DCCV.   He was monitored until discharge on telemetry which demonstrated AFib > SB, occ PACs, infrequently had brief V bigeminy .  On the day of discharge, he feels well, was examined by Dr [name] who considered the patient stable for discharge to home.  Follow-up has been arranged with the AFib clinic in 1 week and EP schedulers messaged to arrange EP team follow up for 4 weeks.    Tikosyn teaching was completed He did require potassium supplementation while here though historically in review of his labs on his current home dose of , his potassium has been stable and 4.0 or better, arrived with K+ 4.0, no additional electrolyte replacement for home, he [facility] have labs in a week as usual   Physical Exam: Vitals:   03/24/23 1245 03/24/23 1802  03/24/23 2158 03/25/23 0304  BP: (!) 143/79 (!) 160/73 132/66 135/62  Pulse: (!) 56  (!) 57 (!) 54  Resp: 18  16 15   Temp: 97.8 F (36.6 C)  97.9 F (36.6 C) 98 F (36.7 C)  TempSrc: Oral  Oral Oral  SpO2: 96%  95% 94%     GEN- The patient is well appearing, alert and oriented x 3 today.   HEENT: normocephalic, atraumatic; sclera clear, conjunctiva pink; hearing intact; oropharynx clear; neck supple, no JVP Lymph- no cervical lymphadenopathy Lungs- CTA b/l, normal work of breathing.  No wheezes, rales, rhonchi Heart- RRR, no murmurs, rubs or gallops, PMI not laterally displaced GI- soft, non-tender, non-distended Extremities- no clubbing, cyanosis, or edema MS- no significant deformity or atrophy Skin- warm and dry, no rash or lesion Psych- euthymic mood, full affect Neuro- strength and sensation are intact   Labs:   Lab Results  Component Value Date   WBC 7.1 02/14/2023   HGB 13.6 02/14/2023   HCT 39.4 02/14/2023   MCV 98 (H) 02/14/2023   PLT 223 02/14/2023    Recent Labs  Lab 03/25/23 0302  NA 138  K 4.1  CL 105  CO2 25  BUN 26*  CREATININE 1.26*  CALCIUM 9.1  GLUCOSE 86     Discharge Medications:  Allergies as of 03/25/2023       Reactions   Statins    Myalgias with elevated CPKs        Medication List  TAKE these medications    acetaminophen 500 MG tablet Commonly known as: TYLENOL Take 1,000 mg by mouth every 4 (four) hours as needed for moderate pain.   albuterol 108 (90 Base) MCG/ACT inhaler Commonly known as: VENTOLIN HFA INHALE 2 PUFFS INTO THE LUNGS EVERY 6 HOURS AS NEEDED FOR WHEEZING OR SHORTNESS OF BREATH What changed:  when to take this additional instructions   ascorbic acid 500 MG tablet Commonly known as: VITAMIN C Take 500 mg by mouth daily.   aspirin EC 81 MG tablet Take 1 tablet (81 mg total) by mouth daily. What changed: when to take this   Biotin 08657 MCG Tabs Take 10,000 mcg by mouth in the morning.    cholecalciferol 25 MCG (1000 UNIT) tablet Commonly known as: VITAMIN D3 Take 1,000 Units by mouth every evening.   cyclobenzaprine 5 MG tablet Commonly known as: FLEXERIL Take 1 tablet (5 mg total) by mouth 3 (three) times daily as needed for muscle spasms.   diclofenac sodium 1 % Gel Commonly known as: Voltaren Apply 2 g topically 4 (four) times daily. What changed:  when to take this reasons to take this   divalproex 500 MG 24 hr tablet Commonly known as: DEPAKOTE ER TAKE 1 TABLET(500 MG) BY MOUTH DAILY What changed: See the new instructions.   dofetilide 250 MCG capsule Commonly known as: TIKOSYN Take 1 capsule (250 mcg total) by mouth 2 (two) times daily.   doxycycline 100 MG capsule Commonly known as: VIBRAMYCIN Take 100 mg by mouth every evening.   ezetimibe 10 MG tablet Commonly known as: ZETIA TAKE 1 TABLET(10 MG) BY MOUTH DAILY What changed: See the new instructions.   Fish Oil 1200 MG Caps Take 1,200 mg by mouth 2 (two) times daily.   furosemide 20 MG tablet Commonly known as: LASIX Take 1 tablet (20 MG) By mouth twice daily. As needed, patient may take 20 MG (1 tablet) additional Lasix PRN by mouth daily x 4 days as directed per [employer] HF Study PRN plan What changed:  how much to take how to take this when to take this   gabapentin 300 MG capsule Commonly known as: NEURONTIN TAKE 1 CAPSULE(300 MG) BY MOUTH TWICE DAILY   GLUCOSAMINE CHONDR COMPLEX PO Take 500 mg by mouth 2 (two) times daily.   guaiFENesin 600 MG 12 hr tablet Commonly known as: MUCINEX Take 600 mg by mouth 2 (two) times daily.   hydrALAZINE 25 MG tablet Commonly known as: APRESOLINE TAKE 1 TABLET BY MOUTH EVERY MORNING AND AT BEDTIME   isosorbide mononitrate 30 MG 24 hr tablet Commonly known as: IMDUR TAKE 1 TABLET BY MOUTH EVERY DAY What changed:  how to take this when to take this   montelukast 10 MG tablet Commonly known as: SINGULAIR TAKE 1 TABLET(10 MG)  BY MOUTH AT BEDTIME   multivitamin with minerals Tabs tablet Take 1 tablet by mouth every evening.   nitroGLYCERIN 0.4 MG SL tablet Commonly known as: NITROSTAT Place 1 tablet (0.4 mg total) under the tongue every 5 (five) minutes as needed for chest pain.   potassium chloride 10 MEQ tablet Commonly known as: KLOR-CON As needed patient may take 10 mEq additional Potassium PRN by mouth daily x 4 days as directed per [employer] HF Study PRN plan What changed:  how much to take how to take this when to take this reasons to take this   potassium chloride SA 20 MEQ tablet Commonly known as: KLOR-CON M TAKE  1 TABLET BY MOUTH EVERY DAY What changed: when to take this   ramipril 10 MG capsule Commonly known as: ALTACE TAKE 2 CAPSULES(20 MG) BY MOUTH DAILY What changed: See the new instructions.   Repatha SureClick 140 MG/ML Soaj Generic drug: Evolocumab ADMINISTER 1 ML UNDER THE SKIN EVERY 14 DAYS   tamsulosin 0.4 MG Caps capsule Commonly known as: FLOMAX TAKE 1 CAPSULE(0.4 MG) BY MOUTH DAILY What changed: See the new instructions.   VISINE OP Place 1 drop into both eyes daily as needed (irritation).   Xarelto 20 MG Tabs tablet Generic drug: rivaroxaban TAKE 1 TABLET(20 MG) BY MOUTH DAILY WITH SUPPER   zolpidem 12.5 MG CR tablet Commonly known as: AMBIEN CR TAKE 1 TABLET(12.5 MG) BY MOUTH AT BEDTIME AS NEEDED FOR SLEEP What changed: See the new instructions.        Disposition: Home Discharge Instructions     Diet - low sodium heart healthy   Complete by: As directed    Increase activity slowly   Complete by: As directed         Duration of Discharge Encounter: Greater than 30 minutes including physician time.  Norma Fredrickson, PA-C 03/25/2023 11:48 AM  I have seen and examined this patient with Francis Dowse.  Agree with above, note added to reflect my findings.  Admitted for dofetilide load.  Presented in atrial fibrillation.  Converted to  sinus rhythm after his third dose.  QTc became prolonged.  Dofetilide decreased to 250 mcg twice daily.  Remains in sinus rhythm with rare PVCs.  Plan for discharge today with follow-up in A-fib clinic.  GEN: Well nourished, well developed, in no acute distress  HEENT: normal  Neck: no JVD, carotid bruits, or masses Cardiac: RRR; no murmurs, rubs, or gallops,no edema  Respiratory:  clear to auscultation bilaterally, normal work of breathing GI: soft, nontender, nondistended, + BS MS: no deformity or atrophy  Skin: warm and dry Neuro:  Strength and sensation are intact Psych: euthymic mood, full affect     Esbeidy Mclaine M. Jadia Capers MD 03/25/2023 9:13 PM

## 2023-03-25 NOTE — Progress Notes (Signed)
Pharmacy: Dofetilide (Tikosyn) - Follow Up Assessment and Electrolyte Replacement  Pharmacy consulted to assist in monitoring and replacing electrolytes in this 72 y.o. male admitted on 03/22/2023 undergoing dofetilide initiation. First dofetilide dose: 03/22/23  Labs:    Component Value Date/Time   K 4.1 03/25/2023 0302   MG 2.2 03/25/2023 0302     Plan: Potassium: K >/= 4: No additional supplementation needed  Magnesium: Mg > 2: No additional supplementation needed   As patient has required on average 46 mEq of potassium replacement every day, recommend discharging patient with prescription for:  Potassium chloride 40 mEq  daily (increase from home 20 mEq dose)  Thank you for allowing pharmacy to participate in this patient's care   Fredonia Highland, PharmD, BCPS, [facility] Clinical Pharmacist [phone] Please check AMION for all [facility] Pharmacy numbers 03/25/2023

## 2023-03-28 ENCOUNTER — Encounter: Payer: Self-pay | Admitting: *Deleted

## 2023-03-28 ENCOUNTER — Ambulatory Visit: Payer: Self-pay

## 2023-03-28 ENCOUNTER — Telehealth: Payer: Self-pay | Admitting: *Deleted

## 2023-03-28 NOTE — Chronic Care Management (AMB) (Signed)
   03/28/2023  [name] Brown 1951-09-23 [id number]   Reason for Encounter: Patient no longer enrolled in the CCM program. CCM enrollment status updated.   [facility]/Chronic Care Management [phone]

## 2023-03-28 NOTE — Transitions of Care (Post Inpatient/ED Visit) (Signed)
   03/28/2023  Name: [name] Brown MRN: [id number] DOB: 22-Nov-1950  Today's TOC FU Call Status: Today's TOC FU Call Status:: Unsuccessul Call (1st Attempt) Unsuccessful Call (1st Attempt) Date: 03/28/23  Attempted to reach the patient regarding the most recent Inpatient visit; no physical or voice mail pick up; unable to leave voice message requesting call back   Follow Up Plan: Additional outreach attempts will be made to reach the patient to complete the Transitions of Care (Post Inpatient visit) call.   Caryl Pina, RN, BSN, CCRN Alumnus RN CM Care Coordination/ Transition of Care- [facility] [phone]: direct office  '

## 2023-03-29 ENCOUNTER — Telehealth: Payer: Self-pay | Admitting: *Deleted

## 2023-03-29 ENCOUNTER — Encounter: Payer: Self-pay | Admitting: *Deleted

## 2023-03-29 NOTE — Transitions of Care (Post Inpatient/ED Visit) (Signed)
03/29/2023  Name: [name] Brown MRN: [id number] DOB: [birth year]  Today's TOC FU Call Status: Today's TOC FU Call Status:: Successful TOC FU Call Competed TOC FU Call Complete Date: 03/29/23  Transition Care Management Follow-up Telephone Call Date of Discharge: 03/25/23 Discharge Facility: [facility]) Type of Discharge: Inpatient Admission Primary Inpatient Discharge Diagnosis:: Persistent AF- Tikosyn load How have you been since you were released from the hospital?: Better ("Doing fine-- back to my normal self, even able to get out and do yard work.  I am staying in NS[facility] so far, so all is good.  Can't review medicine with you today- my wife handles medications and she is not here.  I am taking the Tikosyn as they told me to") Any questions or concerns?: No  Items Reviewed: Did you receive and understand the discharge instructions provided?: Yes (thoroughly reviewed with patient who verbalizes good understanding of same) Medications obtained,verified, and reconciled?: Partial Review Completed (Partial medication review completed; patient declined full review; confirmed patient obtained/ is taking all newly Rx'd medications as instructed; spouse-manages medications and pt. denies questions/ concerns around medications today) Reason for Partial Mediation Review: patient declined- wife manages medications and she is not present to review Any new allergies since your discharge?: No Dietary orders reviewed?: Yes Type of Diet Ordered:: Heart Healthy Do you have support at home?: Yes People in Home: spouse Name of Support/Comfort Primary Source: Reports independent in self-care activities; supportive spouse assists as/ if needed/ indicated  Medications Reviewed Today: Medications Reviewed Today     Reviewed by Michaela Corner, RN (Registered Nurse) on 03/29/23 at 1544  Med List Status: <None>   Medication Order Taking? Sig Documenting Provider Last Dose Status Informant  acetaminophen  (TYLENOL) 500 MG tablet [id number]  Take 1,000 mg by mouth every 4 (four) hours as needed for moderate pain. [provider]  Active Self  albuterol (VENTOLIN HFA) 108 (90 Base) MCG/ACT inhaler [id number]  INHALE 2 PUFFS INTO THE LUNGS EVERY 6 HOURS AS NEEDED FOR WHEEZING OR SHORTNESS OF BREATH  Patient taking differently: Inhale 2 puffs into the lungs See admin instructions. Inhale 2 puffs at bedtime, may inhale 2 puffs every 6 hours as needed for shortness of breath   Myrlene Broker, MD  Active Self  aspirin EC 81 MG EC tablet [id number]  Take 1 tablet (81 mg total) by mouth daily.  Patient taking differently: Take 81 mg by mouth in the morning.   Lorin Mercy  Active Self  Biotin 24401 MCG TABS [id number]  Take 10,000 mcg by mouth in the morning. [provider]  Active Self  cholecalciferol (VITAMIN D) 25 MCG (1000 UT) tablet [id number]  Take 1,000 Units by mouth every evening. [provider]  Active Self  cyclobenzaprine (FLEXERIL) 5 MG tablet [id number]  Take 1 tablet (5 mg total) by mouth 3 (three) times daily as needed for muscle spasms. Myrlene Broker, MD  Active Self  diclofenac sodium (VOLTAREN) 1 % GEL [id number]  Apply 2 g topically 4 (four) times daily.  Patient taking differently: Apply 2 g topically 4 (four) times daily as needed (pain).   Myrlene Broker, MD  Active Self  divalproex (DEPAKOTE ER) 500 MG 24 hr tablet [id number]  TAKE 1 TABLET(500 MG) BY MOUTH DAILY  Patient taking differently: Take 500 mg by mouth every evening.   Myrlene Broker, MD  Active Self  dofetilide [facility]) 250 MCG capsule [id number] Yes Take 1 capsule (250 mcg  total) by mouth 2 (two) times daily. Sheilah Pigeon, PA-C Taking Active   doxycycline (VIBRAMYCIN) 100 MG capsule [id number]  Take 100 mg by mouth every evening. [provider]  Active Self  Evolocumab (REPATHA SURECLICK) 140 MG/ML Ivory Broad [id number]  ADMINISTER 1 ML UNDER THE SKIN EVERY  14 DAYS Wendall Stade, MD  Active Self  ezetimibe (ZETIA) 10 MG tablet [id number]  TAKE 1 TABLET(10 MG) BY MOUTH DAILY  Patient taking differently: Take 10 mg by mouth in the morning.   Wendall Stade, MD  Active Self  furosemide (LASIX) 20 MG tablet [id number]  Take 1 tablet (20 MG) By mouth twice daily. As needed, patient may take 20 MG (1 tablet) additional Lasix PRN by mouth daily x 4 days as directed per [employer] HF Study PRN plan  Patient taking differently: Take 20 mg by mouth 2 (two) times daily. Take 1 tablet (20 MG) By mouth twice daily. As needed, patient may take 20 MG (1 tablet) additional Lasix PRN by mouth daily x 4 days as directed per [employer] HF Study PRN plan   Regan Lemming, MD  Active Self  gabapentin (NEURONTIN) 300 MG capsule [id number]  TAKE 1 CAPSULE(300 MG) BY MOUTH TWICE DAILY Myrlene Broker, MD  Active Self  Glucosamine-Chondroitin (GLUCOSAMINE CHONDR COMPLEX PO) [id number]  Take 500 mg by mouth 2 (two) times daily.  [provider]  Active Self  guaiFENesin (MUCINEX) 600 MG 12 hr tablet [id number]  Take 600 mg by mouth 2 (two) times daily. [provider]  Active Self  hydrALAZINE (APRESOLINE) 25 MG tablet [id number]  TAKE 1 TABLET BY MOUTH EVERY MORNING AND AT BEDTIME Camnitz, Will Daphine Deutscher, MD  Active Self  isosorbide mononitrate (IMDUR) 30 MG 24 hr tablet [id number]  TAKE 1 TABLET BY MOUTH EVERY DAY  Patient taking differently: 30 mg in the morning.   Wendall Stade, MD  Active Self  montelukast (SINGULAIR) 10 MG tablet [id number]  TAKE 1 TABLET(10 MG) BY MOUTH AT BEDTIME Myrlene Broker, MD  Active Self  Multiple Vitamin (MULTIVITAMIN WITH MINERALS) TABS tablet [id number]  Take 1 tablet by mouth every evening. [provider]  Active Self  nitroGLYCERIN (NITROSTAT) 0.4 MG SL tablet [id number]  Place 1 tablet (0.4 mg total) under the tongue every 5 (five) minutes as needed for chest pain. Wendall Stade, MD   Active Self  Omega-3 Fatty Acids (FISH OIL) 1200 MG CAPS [id number]  Take 1,200 mg by mouth 2 (two) times daily. [provider]  Active Self  potassium chloride (KLOR-CON) 10 MEQ tablet [id number]  As needed patient may take 10 mEq additional Potassium PRN by mouth daily x 4 days as directed per [employer] HF Study PRN plan  Patient taking differently: Take 10 mEq by mouth as needed. As needed patient may take 10 mEq additional Potassium PRN by mouth daily x 4 days as directed per [employer] HF Study PRN plan   Regan Lemming, MD  Active Self  potassium chloride SA (KLOR-CON M) 20 MEQ tablet [id number]  TAKE 1 TABLET BY MOUTH EVERY DAY  Patient taking differently: Take 20 mEq by mouth in the morning.   Wendall Stade, MD  Active Self  ramipril (ALTACE) 10 MG capsule [id number]  TAKE 2 CAPSULES(20 MG) BY MOUTH DAILY  Patient taking differently: Take 10 mg by mouth every evening.   Wendall Stade, MD  Active Self  tamsulosin [facility]) 0.4 MG CAPS capsule [id number]  TAKE 1 CAPSULE(0.4 MG) BY MOUTH DAILY  Patient taking differently: Take 0.4 mg by mouth every evening.   Myrlene Broker, MD  Active Self  Tetrahydrozoline HCl [facility]) [id number]  Place 1 drop into both eyes daily as needed (irritation). [provider]  Active Self  vitamin C (ASCORBIC ACID) 500 MG tablet [id number]  Take 500 mg by mouth daily. [provider]  Active Self  XARELTO 20 MG TABS tablet [id number]  TAKE 1 TABLET(20 MG) BY MOUTH DAILY WITH SUPPER Wendall Stade, MD  Active Self  zolpidem (AMBIEN CR) 12.5 MG CR tablet [id number]  TAKE 1 TABLET(12.5 MG) BY MOUTH AT BEDTIME AS NEEDED FOR SLEEP  Patient taking differently: Take 12.5 mg by mouth at bedtime.   Myrlene Broker, MD  Active Self            Home Care and Equipment/Supplies: Were Home Health Services Ordered?: No Any new equipment or medical supplies ordered?: No  Functional Questionnaire: Do you  need assistance with bathing/showering or dressing?: No Do you need assistance with meal preparation?: No Do you need assistance with eating?: No Do you have difficulty maintaining continence: No Do you need assistance with getting out of bed/getting out of a chair/moving?: No Do you have difficulty managing or taking your medications?: Yes (wife manages all aspects of medication administration)  Follow up appointments reviewed: PCP Follow-up appointment confirmed?: NA (verified not indicated per hospital discharging provider discharge notes) Specialist Hospital Follow-up appointment confirmed?: Yes Date of Specialist follow-up appointment?: 04/01/23 Follow-Up Specialty Provider:: AF Clinic Do you need transportation to your follow-up appointment?: No Do you understand care options if your condition(s) worsen?: Yes-patient verbalized understanding  SDOH Interventions Today    Flowsheet Row Most Recent Value  SDOH Interventions   Food Insecurity Interventions Intervention Not Indicated  Transportation Interventions Intervention Not Indicated  [drives self,  wife assists as/ if indicated]      TOC Interventions Today    Flowsheet Row Most Recent Value  TOC Interventions   TOC Interventions Discussed/Reviewed TOC Interventions Discussed      Interventions Today    Flowsheet Row Most Recent Value  Chronic Disease   Chronic disease during today's visit Other, Atrial Fibrillation (AFib)  [Tikosyn load]  General Interventions   General Interventions Discussed/Reviewed General Interventions Discussed, Doctor Visits, Durable Medical Equipment (DME)  Doctor Visits Discussed/Reviewed Doctor Visits Discussed, Specialist, PCP  Durable Medical Equipment (DME) Other  [confirmed patient does not currently require assistive devices]  PCP/Specialist Visits Compliance with follow-up visit  Nutrition Interventions   Nutrition Discussed/Reviewed Nutrition Discussed  Pharmacy Interventions    Pharmacy Dicussed/Reviewed Pharmacy Topics Discussed      Caryl Pina, RN, BSN, CCRN Alumnus RN CM Care Coordination/ Transition of Care- [facility] Care Management [phone]: direct office

## 2023-04-01 ENCOUNTER — Encounter (HOSPITAL_COMMUNITY): Payer: Self-pay | Admitting: Internal Medicine

## 2023-04-01 ENCOUNTER — Ambulatory Visit (HOSPITAL_COMMUNITY)
Admit: 2023-04-01 | Discharge: 2023-04-01 | Disposition: A | Discharge status: Home / Self Care | Payer: PPO | Attending: Internal Medicine | Admitting: Internal Medicine

## 2023-04-01 VITALS — BP 130/60 | HR 62 | Ht 70.0 in | Wt 188.4 lb

## 2023-04-01 DIAGNOSIS — Z79899 Other long term (current) drug therapy: Secondary | ICD-10-CM | POA: Insufficient documentation

## 2023-04-01 DIAGNOSIS — I4819 Other persistent atrial fibrillation: Secondary | ICD-10-CM | POA: Insufficient documentation

## 2023-04-01 DIAGNOSIS — Z951 Presence of aortocoronary bypass graft: Secondary | ICD-10-CM | POA: Diagnosis not present

## 2023-04-01 DIAGNOSIS — D6869 Other thrombophilia: Secondary | ICD-10-CM | POA: Diagnosis not present

## 2023-04-01 DIAGNOSIS — I2510 Atherosclerotic heart disease of native coronary artery without angina pectoris: Secondary | ICD-10-CM | POA: Insufficient documentation

## 2023-04-01 DIAGNOSIS — E785 Hyperlipidemia, unspecified: Secondary | ICD-10-CM | POA: Insufficient documentation

## 2023-04-01 DIAGNOSIS — G4733 Obstructive sleep apnea (adult) (pediatric): Secondary | ICD-10-CM | POA: Diagnosis not present

## 2023-04-01 DIAGNOSIS — I351 Nonrheumatic aortic (valve) insufficiency: Secondary | ICD-10-CM | POA: Insufficient documentation

## 2023-04-01 DIAGNOSIS — Z5181 Encounter for therapeutic drug level monitoring: Secondary | ICD-10-CM

## 2023-04-01 DIAGNOSIS — I10 Essential (primary) hypertension: Secondary | ICD-10-CM | POA: Insufficient documentation

## 2023-04-01 DIAGNOSIS — Z7901 Long term (current) use of anticoagulants: Secondary | ICD-10-CM | POA: Diagnosis not present

## 2023-04-01 LAB — BASIC METABOLIC PANEL
Anion gap: 9 (ref 5–15)
BUN: 17 mg/dL (ref 8–23)
CO2: 23 mmol/L (ref 22–32)
Calcium: 9.1 mg/dL (ref 8.9–10.3)
Chloride: 104 mmol/L (ref 98–111)
Creatinine, Ser: 1.02 mg/dL (ref 0.61–1.24)
GFR, Estimated: >60 mL/min (ref >60)
Glucose, Bld: 131 mg/dL — ABNORMAL HIGH (ref 70–99)
Potassium: 4 mmol/L (ref 3.5–5.1)
Sodium: 136 mmol/L (ref 135–145)

## 2023-04-01 LAB — MAGNESIUM: Magnesium: 2.3 mg/dL (ref 1.7–2.4)

## 2023-04-01 MED ORDER — DOFETILIDE 250 MCG PO CAPS: 250.0000 ug | ORAL_CAPSULE | Freq: Two times a day (BID) | ORAL | 3 refills | Status: DC

## 2023-04-01 NOTE — Progress Notes (Signed)
Primary Care Physician: Myrlene Broker, MD Primary Cardiologist: Dr Eden Emms Primary Electrophysiologist: Dr [name] Referring Physician: Dr Emily Filbert is a 72 y.o. male with a history of coronary artery disease status post three-vessel CABG, hypertension, OSA, hyperlipidemia, and atrial fibrillation.  He was found to be in atrial fibrillation incidentally on office visit 08/30/2017.  He was put on Eliquis at the time.  Beta-blockers were not started due to resting bradycardia.  January 2020 he was noted increasing dyspnea on exertion.  He had a cardioversion 11/28/18 and was started on amiodarone. He does report today that he was diagnosed with OSA a few years ago but could not tolerate CPAP therapy. He is on Eliquis for a CHADS2VASC score of 3. Patient is s/p afib ablation 03/19/20. He had recurrent persistent afib and is s/p DCCV on 01/13/21. He initially did well but went back into persistent afib and underwent repeat ablation with Dr [name] on 10/25/22.  On follow up today, patient has been in persistent afib on his Kardia mobile device. Also in afib when he saw Dr Eden Emms on 12/08/22. He is scheduled for DCCV on 01/04/23. No bleeding issues on anticoagulation.   On follow up 03/22/23, he is here today for Tikosyn admission. No recent benadryl use. No missed doses of Xarelto. No new medications since pharmacist review.   On follow up 04/01/23 he is s/p Tikosyn admission 6/11-14/24. He did not require cardioversion. Dose reduced due to Qtc lengthening. Monitored until discharge on telemetry which demonstrates Afib > SB. Currently on Tikosyn 250 mcg BID. He notes via Kardiamobile device several possible episodes of Afib since he has been home. No missed doses of Tikosyn or Xarelto.  Today, he denies symptoms of palpitations, chest pain, orthopnea, PND, lower extremity edema, dizziness, presyncope, syncope, bleeding, or neurologic sequela. The patient is tolerating medications without  difficulties and is otherwise without complaint today.    Atrial Fibrillation Risk Factors:  he does have symptoms or diagnosis of sleep apnea. he has an Doctor, hospital.  he does not have a history of rheumatic fever. he does not have a history of alcohol use. The patient does not have a history of early familial atrial fibrillation or other arrhythmias.  he has a BMI of Body mass index is 27.03 kg/m.Marland Kitchen Filed Weights   04/01/23 0939  Weight: 85.5 kg     Family History  Problem Relation Age of Onset   Heart attack Mother    Heart disease Mother    Heart attack Father    Heart disease Father    Rectal cancer Neg Hx    Stomach cancer Neg Hx    Pancreatic cancer Neg Hx    Esophageal cancer Neg Hx    Colon polyps Neg Hx    Colon cancer Neg Hx     Atrial Fibrillation Management history:  Previous antiarrhythmic drugs: amiodarone, tikosyn Previous cardioversions: 11/2018, 01/13/21 Previous ablations: 03/19/20, 10/25/22 CHADS2VASC score: 3 Anticoagulation history: Eliquis   Past Medical History:  Diagnosis Date   Aortic aneurysm (HCC) Endograf 2009   Arthritis    Atrial fibrillation (HCC)    Bradycardia    CAD (coronary artery disease) CABG 03/2000   Median sternotomy for coronary artery bypass grafting x 3 (left   HTN (hypertension)    Hx of CABG 2001   severe 3 vessel disease   Hx of colonic polyp 06/2010   Hyperplastic    Hyperlipidemia    Myocardial infarction (HCC)  Sleep apnea    Past Surgical History:  Procedure Laterality Date   ATRIAL FIBRILLATION ABLATION N/A 03/19/2020   Procedure: ATRIAL FIBRILLATION ABLATION;  Surgeon: Regan Lemming, MD;  Location: [facility] INVASIVE CV LAB;  Service: Cardiovascular;  Laterality: N/A;   ATRIAL FIBRILLATION ABLATION N/A 10/25/2022   Procedure: ATRIAL FIBRILLATION ABLATION;  Surgeon: Regan Lemming, MD;  Location: [facility] INVASIVE CV LAB;  Service: Cardiovascular;  Laterality: N/A;   CARDIOVERSION N/A 11/28/2018    Procedure: CARDIOVERSION;  Surgeon: Parke Poisson, MD;  Location: [facility] ENDOSCOPY;  Service: Cardiovascular;  Laterality: N/A;   CARDIOVERSION N/A 01/13/2021   Procedure: CARDIOVERSION;  Surgeon: Chrystie Nose, MD;  Location: [facility] ENDOSCOPY;  Service: Cardiovascular;  Laterality: N/A;   CARDIOVERSION N/A 01/04/2023   Procedure: CARDIOVERSION;  Surgeon: Meriam Sprague, MD;  Location: [facility] ENDOSCOPY;  Service: Cardiovascular;  Laterality: N/A;   COLONOSCOPY     CORONARY ARTERY BYPASS GRAFT     AAA stent graft on 12/01/2007 at [facility]  coronary bypass in 2001. Dr. Cornelius Moras.   DRUG INDUCED ENDOSCOPY Right 02/18/2020   Procedure: DRUG INDUCED ENDOSCOPY;  Surgeon: Christia Reading, MD;  Location: [facility];  Service: ENT;  Laterality: Right;   FOOT SURGERY Left 07/19/2017   IMPLANTATION OF HYPOGLOSSAL NERVE STIMULATOR Right 07/30/2020   Procedure: IMPLANTATION OF HYPOGLOSSAL NERVE STIMULATOR Implantation of Chest wall respirator Sensor Electrode Electronic Analysis of Implanted Neurostimulator Pulse Generator;  Surgeon: Christia Reading, MD;  Location: [facility];  Service: ENT;  Laterality: Right;   INGUINAL HERNIA REPAIR Bilateral 08/06/2022   Procedure: LAPAROSCOPIC BILATERAL INGUINAL HERNIA REPAIR WITH MESH AND PRIMARY CLOSURE OF UMBILICAL HERNIA;  Surgeon: Quentin Ore, MD;  Location: [facility] ORS;  Service: General;  Laterality: Bilateral;   LEFT HEART CATHETERIZATION WITH CORONARY ANGIOGRAM N/A 10/26/2013   Procedure: LEFT HEART CATHETERIZATION WITH CORONARY ANGIOGRAM;  Surgeon: Peter M Swaziland, MD;  Location: [facility] CATH LAB;  Service: Cardiovascular;  Laterality: N/A;   MASS EXCISION Right 08/10/2019   Procedure: EXCISION OF CYST FROM RIGHT SCALP;  Surgeon: Chevis Pretty III, MD;  Location: [facility];  Service: General;  Laterality: Right;    Current Outpatient Medications  Medication Sig Dispense Refill   acetaminophen (TYLENOL) 500  MG tablet Take 1,000 mg by mouth every 4 (four) hours as needed for moderate pain.     albuterol (VENTOLIN HFA) 108 (90 Base) MCG/ACT inhaler INHALE 2 PUFFS INTO THE LUNGS EVERY 6 HOURS AS NEEDED FOR WHEEZING OR SHORTNESS OF BREATH (Patient taking differently: Inhale 2 puffs into the lungs See admin instructions. Inhale 2 puffs at bedtime, may inhale 2 puffs every 6 hours as needed for shortness of breath) 54 g 1   aspirin EC 81 MG EC tablet Take 1 tablet (81 mg total) by mouth daily. (Patient taking differently: Take 81 mg by mouth in the morning.)     Biotin [id number] MCG TABS Take 10,000 mcg by mouth in the morning.     cholecalciferol (VITAMIN D) 25 MCG (1000 UT) tablet Take 1,000 Units by mouth every evening.     cyclobenzaprine (FLEXERIL) 5 MG tablet Take 1 tablet (5 mg total) by mouth 3 (three) times daily as needed for muscle spasms. 30 tablet 1   diclofenac sodium (VOLTAREN) 1 % GEL Apply 2 g topically 4 (four) times daily. (Patient taking differently: Apply 2 g topically 4 (four) times daily as needed (pain).) 100 g 3   divalproex (DEPAKOTE ER)  500 MG 24 hr tablet TAKE 1 TABLET(500 MG) BY MOUTH DAILY (Patient taking differently: Take 500 mg by mouth every evening.) 90 tablet 1   doxycycline (VIBRAMYCIN) 100 MG capsule Take 100 mg by mouth every evening.     Evolocumab (REPATHA SURECLICK) 140 MG/ML SOAJ ADMINISTER 1 ML UNDER THE SKIN EVERY 14 DAYS 6 mL 3   ezetimibe (ZETIA) 10 MG tablet TAKE 1 TABLET(10 MG) BY MOUTH DAILY (Patient taking differently: Take 10 mg by mouth in the morning.) 90 tablet 3   furosemide (LASIX) 20 MG tablet Take 1 tablet (20 MG) By mouth twice daily. As needed, patient may take 20 MG (1 tablet) additional Lasix PRN by mouth daily x 4 days as directed per [employer] HF Study PRN plan (Patient taking differently: Take 20 mg by mouth 2 (two) times daily. Take 1 tablet (20 MG) By mouth twice daily. As needed, patient may take 20 MG (1 tablet) additional Lasix PRN by mouth  daily x 4 days as directed per [employer] HF Study PRN plan) 180 tablet 3   gabapentin (NEURONTIN) 300 MG capsule TAKE 1 CAPSULE(300 MG) BY MOUTH TWICE DAILY 180 capsule 3   Glucosamine-Chondroitin (GLUCOSAMINE CHONDR COMPLEX PO) Take 500 mg by mouth 2 (two) times daily.      guaiFENesin (MUCINEX) 600 MG 12 hr tablet Take 600 mg by mouth 2 (two) times daily.     hydrALAZINE (APRESOLINE) 25 MG tablet TAKE 1 TABLET BY MOUTH EVERY MORNING AND AT BEDTIME 180 tablet 3   isosorbide mononitrate (IMDUR) 30 MG 24 hr tablet TAKE 1 TABLET BY MOUTH EVERY DAY (Patient taking differently: 30 mg in the morning.) 90 tablet 3   montelukast (SINGULAIR) 10 MG tablet TAKE 1 TABLET(10 MG) BY MOUTH AT BEDTIME 90 tablet 3   Multiple Vitamin (MULTIVITAMIN WITH MINERALS) TABS tablet Take 1 tablet by mouth every evening.     nitroGLYCERIN (NITROSTAT) 0.4 MG SL tablet Place 1 tablet (0.4 mg total) under the tongue every 5 (five) minutes as needed for chest pain. 25 tablet 11   Omega-3 Fatty Acids (FISH OIL) 1200 MG CAPS Take 1,200 mg by mouth 2 (two) times daily.     potassium chloride (KLOR-CON) 10 MEQ tablet As needed patient may take 10 mEq additional Potassium PRN by mouth daily x 4 days as directed per [employer] HF Study PRN plan (Patient taking differently: Take 10 mEq by mouth as needed. As needed patient may take 10 mEq additional Potassium PRN by mouth daily x 4 days as directed per [employer] HF Study PRN plan) 90 tablet 3   potassium chloride SA (KLOR-CON M) 20 MEQ tablet TAKE 1 TABLET BY MOUTH EVERY DAY (Patient taking differently: Take 20 mEq by mouth in the morning.) 90 tablet 3   ramipril (ALTACE) 10 MG capsule TAKE 2 CAPSULES(20 MG) BY MOUTH DAILY (Patient taking differently: Take 10 mg by mouth every evening.) 180 capsule 3   tamsulosin (FLOMAX) 0.4 MG CAPS capsule TAKE 1 CAPSULE(0.4 MG) BY MOUTH DAILY (Patient taking differently: Take 0.4 mg by mouth every evening.) 90 capsule 3    Tetrahydrozoline HCl (VISINE OP) Place 1 drop into both eyes daily as needed (irritation).     vitamin C (ASCORBIC ACID) 500 MG tablet Take 500 mg by mouth daily.     XARELTO 20 MG TABS tablet TAKE 1 TABLET(20 MG) BY MOUTH DAILY WITH SUPPER 90 tablet 3   zolpidem (AMBIEN CR) 12.5 MG CR tablet TAKE 1 TABLET(12.5 MG)  BY MOUTH AT BEDTIME AS NEEDED FOR SLEEP (Patient taking differently: Take 12.5 mg by mouth at bedtime.) 30 tablet 0   dofetilide (TIKOSYN) 250 MCG capsule Take 1 capsule (250 mcg total) by mouth 2 (two) times daily. 60 capsule 3   No current facility-administered medications for this encounter.    Allergies  Allergen Reactions   Statins     Myalgias with elevated CPKs    ROS- All systems are reviewed and negative except as per the HPI above.  Physical Exam: Vitals:   04/01/23 0939  BP: 130/60  Pulse: 62  Weight: 85.5 kg  Height: 5\' 10"  (1.778 m)    GEN- The patient is well appearing, alert and oriented x 3 today.   Head- normocephalic, atraumatic Eyes-  Sclera clear, conjunctiva pink Ears- hearing intact Lungs- Clear to ausculation bilaterally, normal work of breathing Heart- Regular rate and rhythm, no murmurs, rubs or gallops, PMI not laterally displaced Extremities- no clubbing, cyanosis, or edema MS- no significant deformity or atrophy Skin- no rash or lesion Psych- euthymic mood, full affect Neuro- strength and sensation are intact  Wt Readings from Last 3 Encounters:  04/01/23 85.5 kg  03/22/23 86 kg  02/18/23 88 kg    EKG today demonstrates  NSR with PACs HR 62  PR 202 ms QRS 116 ms QT/Qtc 430/436 ms  Echo 01/21/20 demonstrated   1. Left ventricular ejection fraction, by estimation, is 55 to 60%. The  left ventricle has normal function. Left ventricular endocardial border  not optimally defined to evaluate regional wall motion. There is mild left  ventricular hypertrophy. Left  ventricular diastolic parameters are consistent with Grade II  diastolic  dysfunction (pseudonormalization).   2. Right ventricular systolic function is normal. The right ventricular  size is mildly enlarged. There is mildly elevated pulmonary artery  systolic pressure.   3. Left atrial size was moderately dilated.   4. Right atrial size was mildly dilated.   5. The mitral valve is normal in structure. Mild mitral valve  regurgitation.   6. The aortic valve is tricuspid. Aortic valve regurgitation is mild.  Mild aortic valve sclerosis is present, with no evidence of aortic valve  stenosis.   7. The inferior vena cava is normal in size with greater than 50%  respiratory variability, suggesting right atrial pressure of 3 mmHg.   Epic records are reviewed at length today  CHA2DS2-VASc Score = 3  The patient's score is based upon: CHF History: 0 HTN History: 1 Diabetes History: 0 Stroke History: 0 Vascular Disease History: 1 Age Score: 1 Gender Score: 0       ASSESSMENT AND PLAN: 1. Persistent Atrial Fibrillation (ICD10:  I48.19) The patient's CHA2DS2-VASc score is 3, indicating a 3.2% annual risk of stroke.   S/p afib ablation 03/19/20 with repeat ablation 10/25/22 S/p Tikosyn admission 6/11-14/24  Qtc stable Bmet and mag drawn today. Continue Tikosyn 250 mcg BID. F/u 1 month for Tikosyn surveillance.   2. Secondary Hypercoagulable State (ICD10:  D68.69) The patient is at significant risk for stroke/thromboembolism based upon his CHA2DS2-VASc Score of 3.  Continue Xarelto No missed doses  3. HTN Stable, no changes today.  4. Obstructive sleep apnea Inspire device placed 07/30/20 Followed by Dr Jenne Pane and Dr Mayford Knife   5. CAD S/p CABG. No anginal symptoms.    F/u 1 month for Tikosyn surveillance.    Lake Bells, PA-C Afib Clinic [facility] [address] [place], [place] [address] [phone] 04/01/2023 11:24 AM

## 2023-04-08 DIAGNOSIS — H5203 Hypermetropia, bilateral: Secondary | ICD-10-CM | POA: Diagnosis not present

## 2023-04-08 DIAGNOSIS — H31003 Unspecified chorioretinal scars, bilateral: Secondary | ICD-10-CM | POA: Diagnosis not present

## 2023-04-08 DIAGNOSIS — H2513 Age-related nuclear cataract, bilateral: Secondary | ICD-10-CM | POA: Diagnosis not present

## 2023-04-15 ENCOUNTER — Other Ambulatory Visit: Payer: Self-pay | Admitting: Internal Medicine

## 2023-04-15 NOTE — Telephone Encounter (Signed)
Pt is due for a physical After July 5th

## 2023-04-18 DIAGNOSIS — Z006 Encounter for examination for normal comparison and control in clinical research program: Secondary | ICD-10-CM

## 2023-04-18 NOTE — Research (Signed)
[facility] 2 Month follow up  No adverse events or cardiovascular medication changes to report at this time. Patient was admitted and started on Tikosyn 6/11-6/14, this was already reported.   Current Outpatient Medications:    acetaminophen (TYLENOL) 500 MG tablet, Take 1,000 mg by mouth every 4 (four) hours as needed for moderate pain., Disp: , Rfl:    albuterol (VENTOLIN HFA) 108 (90 Base) MCG/ACT inhaler, INHALE 2 PUFFS INTO THE LUNGS EVERY 6 HOURS AS NEEDED FOR WHEEZING OR SHORTNESS OF BREATH (Patient taking differently: Inhale 2 puffs into the lungs See admin instructions. Inhale 2 puffs at bedtime, may inhale 2 puffs every 6 hours as needed for shortness of breath), Disp: 54 g, Rfl: 1   aspirin EC 81 MG EC tablet, Take 1 tablet (81 mg total) by mouth daily. (Patient taking differently: Take 81 mg by mouth in the morning.), Disp: , Rfl:    Biotin 16109 MCG TABS, Take 10,000 mcg by mouth in the morning., Disp: , Rfl:    cholecalciferol (VITAMIN D) 25 MCG (1000 UT) tablet, Take 1,000 Units by mouth every evening., Disp: , Rfl:    cyclobenzaprine (FLEXERIL) 5 MG tablet, Take 1 tablet (5 mg total) by mouth 3 (three) times daily as needed for muscle spasms., Disp: 30 tablet, Rfl: 1   diclofenac sodium (VOLTAREN) 1 % GEL, Apply 2 g topically 4 (four) times daily. (Patient taking differently: Apply 2 g topically 4 (four) times daily as needed (pain).), Disp: 100 g, Rfl: 3   divalproex (DEPAKOTE ER) 500 MG 24 hr tablet, TAKE 1 TABLET(500 MG) BY MOUTH DAILY (Patient taking differently: Take 500 mg by mouth every evening.), Disp: 90 tablet, Rfl: 1   dofetilide (TIKOSYN) 250 MCG capsule, Take 1 capsule (250 mcg total) by mouth 2 (two) times daily., Disp: 60 capsule, Rfl: 3   doxycycline (VIBRAMYCIN) 100 MG capsule, Take 100 mg by mouth every evening., Disp: , Rfl:    Evolocumab (REPATHA SURECLICK) 140 MG/ML SOAJ, ADMINISTER 1 ML UNDER THE SKIN EVERY 14 DAYS, Disp: 6 mL, Rfl: 3   ezetimibe (ZETIA) 10 MG  tablet, TAKE 1 TABLET(10 MG) BY MOUTH DAILY (Patient taking differently: Take 10 mg by mouth in the morning.), Disp: 90 tablet, Rfl: 3   furosemide (LASIX) 20 MG tablet, Take 1 tablet (20 MG) By mouth twice daily. As needed, patient may take 20 MG (1 tablet) additional Lasix PRN by mouth daily x 4 days as directed per [facility] HF Study PRN plan (Patient taking differently: Take 20 mg by mouth 2 (two) times daily. Take 1 tablet (20 MG) By mouth twice daily. As needed, patient may take 20 MG (1 tablet) additional Lasix PRN by mouth daily x 4 days as directed per [facility] HF Study PRN plan), Disp: 180 tablet, Rfl: 3   gabapentin (NEURONTIN) 300 MG capsule, TAKE 1 CAPSULE(300 MG) BY MOUTH TWICE DAILY, Disp: 180 capsule, Rfl: 3   Glucosamine-Chondroitin (GLUCOSAMINE CHONDR COMPLEX PO), Take 500 mg by mouth 2 (two) times daily. , Disp: , Rfl:    guaiFENesin (MUCINEX) 600 MG 12 hr tablet, Take 600 mg by mouth 2 (two) times daily., Disp: , Rfl:    hydrALAZINE (APRESOLINE) 25 MG tablet, TAKE 1 TABLET BY MOUTH EVERY MORNING AND AT BEDTIME, Disp: 180 tablet, Rfl: 3   isosorbide mononitrate (IMDUR) 30 MG 24 hr tablet, TAKE 1 TABLET BY MOUTH EVERY DAY (Patient taking differently: 30 mg in the morning.), Disp: 90 tablet, Rfl: 3   montelukast (SINGULAIR)  10 MG tablet, TAKE 1 TABLET(10 MG) BY MOUTH AT BEDTIME, Disp: 90 tablet, Rfl: 3   Multiple Vitamin (MULTIVITAMIN WITH MINERALS) TABS tablet, Take 1 tablet by mouth every evening., Disp: , Rfl:    nitroGLYCERIN (NITROSTAT) 0.4 MG SL tablet, Place 1 tablet (0.4 mg total) under the tongue every 5 (five) minutes as needed for chest pain., Disp: 25 tablet, Rfl: 11   Omega-3 Fatty Acids (FISH OIL) 1200 MG CAPS, Take 1,200 mg by mouth 2 (two) times daily., Disp: , Rfl:    potassium chloride (KLOR-CON) 10 MEQ tablet, As needed patient may take 10 mEq additional Potassium PRN by mouth daily x 4 days as directed per [facility] HF Study PRN plan  (Patient taking differently: Take 10 mEq by mouth as needed. As needed patient may take 10 mEq additional Potassium PRN by mouth daily x 4 days as directed per [facility] HF Study PRN plan), Disp: 90 tablet, Rfl: 3   potassium chloride SA (KLOR-CON M) 20 MEQ tablet, TAKE 1 TABLET BY MOUTH EVERY DAY (Patient taking differently: Take 20 mEq by mouth in the morning.), Disp: 90 tablet, Rfl: 3   ramipril (ALTACE) 10 MG capsule, TAKE 2 CAPSULES(20 MG) BY MOUTH DAILY (Patient taking differently: Take 10 mg by mouth every evening.), Disp: 180 capsule, Rfl: 3   tamsulosin (FLOMAX) 0.4 MG CAPS capsule, TAKE 1 CAPSULE(0.4 MG) BY MOUTH DAILY (Patient taking differently: Take 0.4 mg by mouth every evening.), Disp: 90 capsule, Rfl: 3   Tetrahydrozoline HCl (VISINE OP), Place 1 drop into both eyes daily as needed (irritation)., Disp: , Rfl:    vitamin C (ASCORBIC ACID) 500 MG tablet, Take 500 mg by mouth daily., Disp: , Rfl:    XARELTO 20 MG TABS tablet, TAKE 1 TABLET(20 MG) BY MOUTH DAILY WITH SUPPER, Disp: 90 tablet, Rfl: 3   zolpidem (AMBIEN CR) 12.5 MG CR tablet, TAKE 1 TABLET(12.5 MG) BY MOUTH AT BEDTIME AS NEEDED FOR SLEEP (Patient taking differently: Take 12.5 mg by mouth at bedtime.), Disp: 30 tablet, Rfl: 0

## 2023-04-19 NOTE — Telephone Encounter (Signed)
Both patient and his wife has been scheduled for there physicals

## 2023-04-19 NOTE — Telephone Encounter (Signed)
Patient called back and needs this medication

## 2023-05-02 ENCOUNTER — Ambulatory Visit: Payer: PPO

## 2023-05-02 VITALS — Ht 70.0 in | Wt 185.4 lb

## 2023-05-02 DIAGNOSIS — Z0000 Encounter for general adult medical examination without abnormal findings: Secondary | ICD-10-CM | POA: Diagnosis not present

## 2023-05-02 NOTE — Patient Instructions (Addendum)
[name] Brown , Thank you for taking time to come for your Medicare Wellness Visit. I appreciate your ongoing commitment to your health goals. Please review the following plan we discussed and let me know if I can assist you in the future.   These are the goals we discussed:  Goals      A-Fib Goals     Your doctor may recommend adopting lifestyle changes, including those listed below:  Get regular physical activity Eat a heart-healthy diet low in salt, saturated fats, trans fats and cholesterol Manage high blood pressure Avoid excessive amounts of alcohol and caffeine Don't smoke Control cholesterol Maintain a healthy weight         This is a list of the screening recommended for you and due dates:  Health Maintenance  Topic Date Due   COVID-19 Vaccine (3 - Moderna risk series) 08/03/2022   DTaP/Tdap/Td vaccine (2 - Td or Tdap) 10/11/2022   Flu Shot  05/12/2023   Medicare Annual Wellness Visit  05/01/2024   Colon Cancer Screening  07/20/2026   Pneumonia Vaccine  Completed   Hepatitis C Screening  Completed   Zoster (Shingles) Vaccine  Completed   HPV Vaccine  Aged Out    Advanced directives: Yes  Conditions/risks identified: Yes  Next appointment: It was nice speaking with you today!  Please follow up in one year for your annual wellness visit via telephone call with Nurse Percell Miller on 05/02/2024 at 8:45 a.m.  If you need to cancel or reschedule please call [phone].  Preventive Care 48 Years and Older, Male  Preventive care refers to lifestyle choices and visits with your health care provider that can promote health and wellness. What does preventive care include? A yearly physical exam. This is also called an annual well check. Dental exams once or twice a year. Routine eye exams. Ask your health care provider how often you should have your eyes checked. Personal lifestyle choices, including: Daily care of your teeth and gums. Regular physical activity. Eating a  healthy diet. Avoiding tobacco and drug use. Limiting alcohol use. Practicing safe sex. Taking low doses of aspirin every day. Taking vitamin and mineral supplements as recommended by your health care provider. What happens during an annual well check? The services and screenings done by your health care provider during your annual well check will depend on your age, overall health, lifestyle risk factors, and family history of disease. Counseling  Your health care provider may ask you questions about your: Alcohol use. Tobacco use. Drug use. Emotional well-being. Home and relationship well-being. Sexual activity. Eating habits. History of falls. Memory and ability to understand (cognition). Work and work Astronomer. Screening  You may have the following tests or measurements: Height, weight, and BMI. Blood pressure. Lipid and cholesterol levels. These may be checked every 5 years, or more frequently if you are over 76 years old. Skin check. Lung cancer screening. You may have this screening every year starting at age 80 if you have a 30-pack-year history of smoking and currently smoke or have quit within the past 15 years. Fecal occult blood test (FOBT) of the stool. You may have this test every year starting at age 67. Flexible sigmoidoscopy or colonoscopy. You may have a sigmoidoscopy every 5 years or a colonoscopy every 10 years starting at age 7. Prostate cancer screening. Recommendations will vary depending on your family history and other risks. Hepatitis C blood test. Hepatitis B blood test. Sexually transmitted disease (STD) testing. Diabetes screening.  This is done by checking your blood sugar (glucose) after you have not eaten for a while (fasting). You may have this done every 1-3 years. Abdominal aortic aneurysm (AAA) screening. You may need this if you are a current or former smoker. Osteoporosis. You may be screened starting at age 66 if you are at high risk. Talk  with your health care provider about your test results, treatment options, and if necessary, the need for more tests. Vaccines  Your health care provider may recommend certain vaccines, such as: Influenza vaccine. This is recommended every year. Tetanus, diphtheria, and acellular pertussis (Tdap, Td) vaccine. You may need a Td booster every 10 years. Zoster vaccine. You may need this after age 69. Pneumococcal 13-valent conjugate (PCV13) vaccine. One dose is recommended after age 80. Pneumococcal polysaccharide (PPSV23) vaccine. One dose is recommended after age 71. Talk to your health care provider about which screenings and vaccines you need and how often you need them. This information is not intended to replace advice given to you by your health care provider. Make sure you discuss any questions you have with your health care provider. Document Released: 10/24/2015 Document Revised: 06/16/2016 Document Reviewed: 07/29/2015 Elsevier Interactive Patient Education  2017 [employer].  Fall Prevention in the Home Falls can cause injuries. They can happen to people of all ages. There are many things you can do to make your home safe and to help prevent falls. What can I do on the outside of my home? Regularly fix the edges of walkways and driveways and fix any cracks. Remove anything that might make you trip as you walk through a door, such as a raised step or threshold. Trim any bushes or trees on the path to your home. Use bright outdoor lighting. Clear any walking paths of anything that might make someone trip, such as rocks or tools. Regularly check to see if handrails are loose or broken. Make sure that both sides of any steps have handrails. Any raised decks and porches should have guardrails on the edges. Have any leaves, snow, or ice cleared regularly. Use sand or salt on walking paths during winter. Clean up any spills in your garage right away. This includes oil or grease  spills. What can I do in the bathroom? Use night lights. Install grab bars by the toilet and in the tub and shower. Do not use towel bars as grab bars. Use non-skid mats or decals in the tub or shower. If you need to sit down in the shower, use a plastic, non-slip stool. Keep the floor dry. Clean up any water that spills on the floor as soon as it happens. Remove soap buildup in the tub or shower regularly. Attach bath mats securely with double-sided non-slip rug tape. Do not have throw rugs and other things on the floor that can make you trip. What can I do in the bedroom? Use night lights. Make sure that you have a light by your bed that is easy to reach. Do not use any sheets or blankets that are too big for your bed. They should not hang down onto the floor. Have a firm chair that has side arms. You can use this for support while you get dressed. Do not have throw rugs and other things on the floor that can make you trip. What can I do in the kitchen? Clean up any spills right away. Avoid walking on wet floors. Keep items that you use a lot in easy-to-reach places. If  you need to reach something above you, use a strong step stool that has a grab bar. Keep electrical cords out of the way. Do not use floor polish or wax that makes floors slippery. If you must use wax, use non-skid floor wax. Do not have throw rugs and other things on the floor that can make you trip. What can I do with my stairs? Do not leave any items on the stairs. Make sure that there are handrails on both sides of the stairs and use them. Fix handrails that are broken or loose. Make sure that handrails are as long as the stairways. Check any carpeting to make sure that it is firmly attached to the stairs. Fix any carpet that is loose or worn. Avoid having throw rugs at the top or bottom of the stairs. If you do have throw rugs, attach them to the floor with carpet tape. Make sure that you have a light switch at the  top of the stairs and the bottom of the stairs. If you do not have them, ask someone to add them for you. What else can I do to help prevent falls? Wear shoes that: Do not have high heels. Have rubber bottoms. Are comfortable and fit you well. Are closed at the toe. Do not wear sandals. If you use a stepladder: Make sure that it is fully opened. Do not climb a closed stepladder. Make sure that both sides of the stepladder are locked into place. Ask someone to hold it for you, if possible. Clearly mark and make sure that you can see: Any grab bars or handrails. First and last steps. Where the edge of each step is. Use tools that help you move around (mobility aids) if they are needed. These include: Canes. Walkers. Scooters. Crutches. Turn on the lights when you go into a dark area. Replace any light bulbs as soon as they burn out. Set up your furniture so you have a clear path. Avoid moving your furniture around. If any of your floors are uneven, fix them. If there are any pets around you, be aware of where they are. Review your medicines with your doctor. Some medicines can make you feel dizzy. This can increase your chance of falling. Ask your doctor what other things that you can do to help prevent falls. This information is not intended to replace advice given to you by your health care provider. Make sure you discuss any questions you have with your health care provider. Document Released: 07/24/2009 Document Revised: 03/04/2016 Document Reviewed: 11/01/2014 Elsevier Interactive Patient Education  2017 [employer].

## 2023-05-02 NOTE — Progress Notes (Signed)
Subjective:   [name] Brown is a 72 y.o. male who presents for Medicare Annual/Subsequent preventive examination.  Visit Complete: Virtual  I connected with  [name] on 05/02/23 by a audio enabled telemedicine application and verified that I am speaking with the correct person using two identifiers.  Patient Location: Home  Provider Location: Office/Clinic  I discussed the limitations of evaluation and management by telemedicine. The patient expressed understanding and agreed to proceed.  Per patient no change in vitals since last visit, unable to obtain new vitals due to telehealth visit   Review of Systems     Cardiac Risk Factors include: advanced age (>80men, >106 women);dyslipidemia;family history of premature cardiovascular disease;hypertension;male gender     Objective:    Today's Vitals   05/02/23 0846  Weight: 185 lb 6.4 oz (84.1 kg)  Height: 5\' 10"  (1.778 m)  PainSc: 0-No pain   Body mass index is 26.6 kg/m.     05/02/2023    8:48 AM 03/23/2023    3:00 PM 01/04/2023    7:51 AM 11/30/2022    8:26 PM 10/25/2022    6:20 AM 08/06/2022   12:26 PM 07/20/2022    8:18 AM  Advanced Directives  Does Patient Have a Medical Advance Directive? Yes No No Yes Yes Yes Yes  Type of Estate agent of [place];Living will   Healthcare Power of [place];Living will Healthcare Power of [place];Living will Healthcare Power of [place];Living will Healthcare Power of [place];Living will  Does patient want to make changes to medical advance directive? No - Patient declined   No - Patient declined No - Patient declined No - Patient declined   Copy of Healthcare Power of Attorney in Chart? Yes - validated most recent copy scanned in chart (See row information)   No - copy requested Yes - validated most recent copy scanned in chart (See row information) Yes - validated most recent copy scanned in chart (See row information) No - copy requested  Would patient  like information on creating a medical advance directive?  No - Patient declined         Current Medications (verified) Outpatient Encounter Medications as of 05/02/2023  Medication Sig   acetaminophen (TYLENOL) 500 MG tablet Take 1,000 mg by mouth every 4 (four) hours as needed for moderate pain.   albuterol (VENTOLIN HFA) 108 (90 Base) MCG/ACT inhaler INHALE 2 PUFFS INTO THE LUNGS EVERY 6 HOURS AS NEEDED FOR WHEEZING OR SHORTNESS OF BREATH (Patient taking differently: Inhale 2 puffs into the lungs See admin instructions. Inhale 2 puffs at bedtime, may inhale 2 puffs every 6 hours as needed for shortness of breath)   aspirin EC 81 MG EC tablet Take 1 tablet (81 mg total) by mouth daily. (Patient taking differently: Take 81 mg by mouth in the morning.)   Biotin 15400 MCG TABS Take 10,000 mcg by mouth in the morning.   cholecalciferol (VITAMIN D) 25 MCG (1000 UT) tablet Take 1,000 Units by mouth every evening.   cyclobenzaprine (FLEXERIL) 5 MG tablet Take 1 tablet (5 mg total) by mouth 3 (three) times daily as needed for muscle spasms.   diclofenac sodium (VOLTAREN) 1 % GEL Apply 2 g topically 4 (four) times daily. (Patient taking differently: Apply 2 g topically 4 (four) times daily as needed (pain).)   divalproex (DEPAKOTE ER) 500 MG 24 hr tablet TAKE 1 TABLET(500 MG) BY MOUTH DAILY (Patient taking differently: Take 500 mg by mouth every evening.)   dofetilide (  TIKOSYN) 250 MCG capsule Take 1 capsule (250 mcg total) by mouth 2 (two) times daily.   doxycycline (VIBRAMYCIN) 100 MG capsule Take 100 mg by mouth every evening.   Evolocumab (REPATHA SURECLICK) 140 MG/ML SOAJ ADMINISTER 1 ML UNDER THE SKIN EVERY 14 DAYS   ezetimibe (ZETIA) 10 MG tablet TAKE 1 TABLET(10 MG) BY MOUTH DAILY (Patient taking differently: Take 10 mg by mouth in the morning.)   furosemide (LASIX) 20 MG tablet Take 1 tablet (20 MG) By mouth twice daily. As needed, patient may take 20 MG (1 tablet) additional Lasix PRN by mouth  daily x 4 days as directed per [employer] HF Study PRN plan (Patient taking differently: Take 20 mg by mouth 2 (two) times daily. Take 1 tablet (20 MG) By mouth twice daily. As needed, patient may take 20 MG (1 tablet) additional Lasix PRN by mouth daily x 4 days as directed per [employer] HF Study PRN plan)   gabapentin (NEURONTIN) 300 MG capsule TAKE 1 CAPSULE(300 MG) BY MOUTH TWICE DAILY   Glucosamine-Chondroitin (GLUCOSAMINE CHONDR COMPLEX PO) Take 500 mg by mouth 2 (two) times daily.    guaiFENesin (MUCINEX) 600 MG 12 hr tablet Take 600 mg by mouth 2 (two) times daily.   hydrALAZINE (APRESOLINE) 25 MG tablet TAKE 1 TABLET BY MOUTH EVERY MORNING AND AT BEDTIME   isosorbide mononitrate (IMDUR) 30 MG 24 hr tablet TAKE 1 TABLET BY MOUTH EVERY DAY (Patient taking differently: 30 mg in the morning.)   montelukast (SINGULAIR) 10 MG tablet TAKE 1 TABLET(10 MG) BY MOUTH AT BEDTIME   Multiple Vitamin (MULTIVITAMIN WITH MINERALS) TABS tablet Take 1 tablet by mouth every evening.   nitroGLYCERIN (NITROSTAT) 0.4 MG SL tablet Place 1 tablet (0.4 mg total) under the tongue every 5 (five) minutes as needed for chest pain.   Omega-3 Fatty Acids (FISH OIL) 1200 MG CAPS Take 1,200 mg by mouth 2 (two) times daily.   potassium chloride (KLOR-CON) 10 MEQ tablet As needed patient may take 10 mEq additional Potassium PRN by mouth daily x 4 days as directed per [employer] HF Study PRN plan (Patient taking differently: Take 10 mEq by mouth as needed. As needed patient may take 10 mEq additional Potassium PRN by mouth daily x 4 days as directed per [employer] HF Study PRN plan)   potassium chloride SA (KLOR-CON M) 20 MEQ tablet TAKE 1 TABLET BY MOUTH EVERY DAY (Patient taking differently: Take 20 mEq by mouth in the morning.)   ramipril (ALTACE) 10 MG capsule TAKE 2 CAPSULES(20 MG) BY MOUTH DAILY (Patient taking differently: Take 10 mg by mouth every evening.)   tamsulosin (FLOMAX) 0.4 MG CAPS  capsule TAKE 1 CAPSULE(0.4 MG) BY MOUTH DAILY (Patient taking differently: Take 0.4 mg by mouth every evening.)   Tetrahydrozoline HCl (VISINE OP) Place 1 drop into both eyes daily as needed (irritation).   vitamin C (ASCORBIC ACID) 500 MG tablet Take 500 mg by mouth daily.   XARELTO 20 MG TABS tablet TAKE 1 TABLET(20 MG) BY MOUTH DAILY WITH SUPPER   zolpidem (AMBIEN CR) 12.5 MG CR tablet TAKE 1 TABLET(12.5 MG) BY MOUTH AT BEDTIME AS NEEDED FOR SLEEP.   No facility-administered encounter medications on file as of 05/02/2023.    Allergies (verified) Statins   History: Past Medical History:  Diagnosis Date   Aortic aneurysm [facility]) Endograf 2009   Arthritis    Atrial fibrillation (HCC)    Bradycardia    CAD (coronary artery disease) CABG  03/2000   Median sternotomy for coronary artery bypass grafting x 3 (left   HTN (hypertension)    Hx of CABG 2001   severe 3 vessel disease   Hx of colonic polyp 06/2010   Hyperplastic    Hyperlipidemia    Myocardial infarction [facility])    Sleep apnea    Past Surgical History:  Procedure Laterality Date   ATRIAL FIBRILLATION ABLATION N/A 03/19/2020   Procedure: ATRIAL FIBRILLATION ABLATION;  Surgeon: Regan Lemming, MD;  Location: [facility] INVASIVE CV LAB;  Service: Cardiovascular;  Laterality: N/A;   ATRIAL FIBRILLATION ABLATION N/A 10/25/2022   Procedure: ATRIAL FIBRILLATION ABLATION;  Surgeon: Regan Lemming, MD;  Location: [facility] INVASIVE CV LAB;  Service: Cardiovascular;  Laterality: N/A;   CARDIOVERSION N/A 11/28/2018   Procedure: CARDIOVERSION;  Surgeon: Parke Poisson, MD;  Location: [facility] ENDOSCOPY;  Service: Cardiovascular;  Laterality: N/A;   CARDIOVERSION N/A 01/13/2021   Procedure: CARDIOVERSION;  Surgeon: Chrystie Nose, MD;  Location: [facility] ENDOSCOPY;  Service: Cardiovascular;  Laterality: N/A;   CARDIOVERSION N/A 01/04/2023   Procedure: CARDIOVERSION;  Surgeon: Meriam Sprague, MD;  Location: [facility] ENDOSCOPY;  Service: Cardiovascular;   Laterality: N/A;   COLONOSCOPY     CORONARY ARTERY BYPASS GRAFT     AAA stent graft on 12/01/2007 at [facility]  coronary bypass in 2001. Dr. Cornelius Moras.   DRUG INDUCED ENDOSCOPY Right 02/18/2020   Procedure: DRUG INDUCED ENDOSCOPY;  Surgeon: Christia Reading, MD;  Location: [facility];  Service: ENT;  Laterality: Right;   FOOT SURGERY Left 07/19/2017   IMPLANTATION OF HYPOGLOSSAL NERVE STIMULATOR Right 07/30/2020   Procedure: IMPLANTATION OF HYPOGLOSSAL NERVE STIMULATOR Implantation of Chest wall respirator Sensor Electrode Electronic Analysis of Implanted Neurostimulator Pulse Generator;  Surgeon: Christia Reading, MD;  Location: [facility];  Service: ENT;  Laterality: Right;   INGUINAL HERNIA REPAIR Bilateral 08/06/2022   Procedure: LAPAROSCOPIC BILATERAL INGUINAL HERNIA REPAIR WITH MESH AND PRIMARY CLOSURE OF UMBILICAL HERNIA;  Surgeon: Quentin Ore, MD;  Location: [facility] ORS;  Service: General;  Laterality: Bilateral;   LEFT HEART CATHETERIZATION WITH CORONARY ANGIOGRAM N/A 10/26/2013   Procedure: LEFT HEART CATHETERIZATION WITH CORONARY ANGIOGRAM;  Surgeon: Peter M Swaziland, MD;  Location: [facility] CATH LAB;  Service: Cardiovascular;  Laterality: N/A;   MASS EXCISION Right 08/10/2019   Procedure: EXCISION OF CYST FROM RIGHT SCALP;  Surgeon: Griselda Miner, MD;  Location: [facility];  Service: General;  Laterality: Right;   Family History  Problem Relation Age of Onset   Heart attack Mother    Heart disease Mother    Heart attack Father    Heart disease Father    Rectal cancer Neg Hx    Stomach cancer Neg Hx    Pancreatic cancer Neg Hx    Esophageal cancer Neg Hx    Colon polyps Neg Hx    Colon cancer Neg Hx    Social History   Socioeconomic History   Marital status: Married    Spouse name: Not on file   Number of children: Not on file   Years of education: Not on file   Highest education level: Not on file  Occupational History    Not on file  Tobacco Use   Smoking status: Former    Current packs/day: 0.00    Average packs/day: 2.0 packs/day for 30.0 years (60.0 ttl pk-yrs)    Types: Cigarettes    Start date: 01/04/1962    Quit  date: 01/05/1992    Years since quitting: 31.3   Smokeless tobacco: Never   Tobacco comments:    Former smoker quit 19 yrs ago 11/22/22  Vaping Use   Vaping status: Never Used  Substance and Sexual Activity   Alcohol use: No   Drug use: Yes    Types: Marijuana   Sexual activity: Not on file  Other Topics Concern   Not on file  Social History Narrative   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   Social Determinants of Health   Financial Resource Strain: Low Risk  (05/02/2023)   Overall Financial Resource Strain (CARDIA)    Difficulty of Paying Living Expenses: Not hard at all  Food Insecurity: No Food Insecurity (05/02/2023)   Hunger Vital Sign    Worried About Running Out of Food in the Last Year: Never true    Ran Out of Food in the Last Year: Never true  Transportation Needs: No Transportation Needs (05/02/2023)   PRAPARE - Administrator, Civil Service (Medical): No    Lack of Transportation (Non-Medical): No  Physical Activity: Insufficiently Active (05/02/2023)   Exercise Vital Sign    Days of Exercise per Week: 7 days    Minutes of Exercise per Session: 20 min  Stress: No Stress Concern Present (05/02/2023)   [employer] of Occupational Health - Occupational Stress Questionnaire    Feeling of Stress : Not at all  Social Connections: Moderately Isolated (05/02/2023)   Social Connection and Isolation Panel [NHANES]    Frequency of Communication with Friends and Family: Three times a week    Frequency of Social Gatherings with Friends and Family: Three times a week    Attends Religious Services: Never    Active Member of Clubs or Organizations: No    Attends Banker Meetings: Never    Marital Status: Married     Tobacco Counseling Counseling given: Not Answered Tobacco comments: Former smoker quit 19 yrs ago 11/22/22   Clinical Intake:  Pre-visit preparation completed: Yes  Pain : No/denies pain Pain Score: 0-No pain     BMI - recorded: 26.6 Nutritional Status: BMI 25 -29 Overweight Nutritional Risks: None Diabetes: No  How often do you need to have someone help you when you read instructions, pamphlets, or other written materials from your doctor or pharmacy?: 1 - Never What is the last grade level you completed in school?: HSG  Interpreter Needed?: No  Information entered by :: Tanaya Dunigan N. Zhara Gieske, LPN.   Activities of Daily Living    05/02/2023    8:49 AM 03/23/2023    3:00 PM  In your present state of health, do you have any difficulty performing the following activities:  Hearing? 0 0  Vision? 0 0  Difficulty concentrating or making decisions? 0 0  Walking or climbing stairs? 0 0  Dressing or bathing? 0 0  Doing errands, shopping? 0 0  Preparing Food and eating ? N   Using the Toilet? N   In the past six months, have you accidently leaked urine? N   Do you have problems with loss of bowel control? N   Managing your Medications? N   Managing your Finances? N   Housekeeping or managing your Housekeeping? N     Patient Care Team: Myrlene Broker, MD as PCP - General (Internal Medicine) Wendall Stade, MD as PCP - Cardiology (Cardiology) Regan Lemming, MD as PCP -  Electrophysiology (Cardiology) Quintella Reichert, MD as PCP - Sleep Medicine (Cardiology) Darrow Bussing, MD as Attending Physician (Family Medicine) Szabat, Vinnie Level, [facility] (Inactive) (Pharmacist)  Indicate any recent Medical Services you may have received from other than [facility] providers in the past year (date may be approximate).     Assessment:   This is a routine wellness examination for [place].  Hearing/Vision screen Hearing Screening - Comments:: Denies hearing difficulties    Vision Screening - Comments:: Wears rx glasses - up to date with routine eye exams with Manning Charity, OD.   Dietary issues and exercise activities discussed:     Goals Addressed             This Visit's Progress    A-Fib Goals       Your doctor may recommend adopting lifestyle changes, including those listed below:  Get regular physical activity Eat a heart-healthy diet low in salt, saturated fats, trans fats and cholesterol Manage high blood pressure Avoid excessive amounts of alcohol and caffeine Don't smoke Control cholesterol Maintain a healthy weight       Depression Screen    05/02/2023    9:01 AM 04/30/2022    8:21 AM 04/30/2022    8:20 AM 04/15/2022    9:44 AM 04/23/2021    9:42 AM 04/16/2020    9:11 AM 10/24/2018    3:57 PM  PHQ 2/9 Scores  PHQ - 2 Score 0 0 0 0 0 0 0  PHQ- 9 Score 0   3       Fall Risk    05/02/2023    8:49 AM 04/30/2022    8:21 AM 04/15/2022    9:44 AM 04/23/2021    9:43 AM 04/16/2020    9:14 AM  Fall Risk   Falls in the past year? 0 0 0 0 1  Number falls in past yr: 0 0 0 0 1  Injury with Fall? 0 0 0 0 0  Risk for fall due to : No Fall Risks No Fall Risks   History of fall(s)  Follow up Falls prevention discussed Falls evaluation completed;Education provided   Falls evaluation completed    MEDICARE RISK AT HOME:  Medicare Risk at Home - 05/02/23 0848     Any stairs in or around the home? No    If so, are there any without handrails? No    Home free of loose throw rugs in walkways, pet beds, electrical cords, etc? Yes    Adequate lighting in your home to reduce risk of falls? Yes    Life alert? No    Use of a cane, walker or w/c? No    Grab bars in the bathroom? No    Shower chair or bench in shower? Yes    Elevated toilet seat or a handicapped toilet? No             TIMED UP AND GO:  Was the test performed?  No    Cognitive Function:        05/02/2023    9:05 AM  6CIT Screen  What Year? 0 points  What month? 0  points  What time? 0 points  Count back from 20 0 points  Months in reverse 0 points  Repeat phrase 0 points  Total Score 0 points    Immunizations Immunization History  Administered Date(s) Administered   Influenza, High Dose Seasonal PF 06/13/2019   Influenza,inj,Quad PF,6+ Mos 07/24/2013, 07/25/2014   Influenza-Unspecified 07/26/2015, 07/24/2016, 06/25/2017,  07/25/2018, 06/26/2019, 07/24/2021, 07/06/2022   Moderna SARS-COV2 Booster Vaccination 07/06/2022   Moderna Sars-Covid-2 Vaccination 11/23/2019, 12/21/2019   Pneumococcal Conjugate-13 10/25/2018   Pneumococcal Polysaccharide-23 02/06/2020   Tdap 10/11/2012   Zoster Recombinant(Shingrix) 08/12/2018, 09/13/2018, 11/10/2018   Zoster, Live 07/24/2013    TDAP status: Due, Education has been provided regarding the importance of this vaccine. Advised may receive this vaccine at local pharmacy or Health Dept. Aware to provide a copy of the vaccination record if obtained from local pharmacy or Health Dept. Verbalized acceptance and understanding.  Flu Vaccine status: Up to date  Pneumococcal vaccine status: Up to date  Covid-19 vaccine status: Completed vaccines  Qualifies for Shingles Vaccine? Yes   Zostavax completed Yes   Shingrix Completed?: Yes  Screening Tests Health Maintenance  Topic Date Due   COVID-19 Vaccine (3 - Moderna risk series) 08/03/2022   DTaP/Tdap/Td (2 - Td or Tdap) 10/11/2022   INFLUENZA VACCINE  05/12/2023   Medicare Annual Wellness (AWV)  05/01/2024   Colonoscopy  07/20/2026   Pneumonia Vaccine 64+ Years old  Completed   Hepatitis C Screening  Completed   Zoster Vaccines- Shingrix  Completed   HPV VACCINES  Aged Out    Health Maintenance  Health Maintenance Due  Topic Date Due   COVID-19 Vaccine (3 - Moderna risk series) 08/03/2022   DTaP/Tdap/Td (2 - Td or Tdap) 10/11/2022    Colorectal cancer screening: Type of screening: Colonoscopy. Completed 07/20/2021. Repeat every 5 years  Lung  Cancer Screening: (Low Dose CT Chest recommended if Age 68-80 years, 20 pack-year currently smoking OR have quit w/in 15years.) does not qualify.   Lung Cancer Screening Referral: no  Additional Screening:  Hepatitis C Screening: does qualify; Completed 04/09/2021  Vision Screening: Recommended annual ophthalmology exams for early detection of glaucoma and other disorders of the eye. Is the patient up to date with their annual eye exam?  Yes  Who is the provider or what is the name of the office in which the patient attends annual eye exams? Manning Charity, OD. If pt is not established with a provider, would they like to be referred to a provider to establish care? No .   Dental Screening: Recommended annual dental exams for proper oral hygiene  Diabetic Foot Exam: N/A  Community Resource Referral / Chronic Care Management: CRR required this visit?  No   CCM required this visit?  No     Plan:     I have personally reviewed and noted the following in the patient's chart:   Medical and social history Use of alcohol, tobacco or illicit drugs  Current medications and supplements including opioid prescriptions. Patient is not currently taking opioid prescriptions. Functional ability and status Nutritional status Physical activity Advanced directives List of other physicians Hospitalizations, surgeries, and ER visits in previous 12 months Vitals Screenings to include cognitive, depression, and falls Referrals and appointments  In addition, I have reviewed and discussed with patient certain preventive protocols, quality metrics, and best practice recommendations. A written personalized care plan for preventive services as well as general preventive health recommendations were provided to patient.     Mickeal Needy, LPN   [id number]/82/9562   After Visit Summary: (Mail) Due to this being a telephonic visit, the after visit summary with patients personalized plan was offered to patient  via mail   Nurse Notes: Normal cognitive status assessed by direct observation via telephone conversation by this Nurse Health Advisor. No abnormalities found.

## 2023-05-10 ENCOUNTER — Encounter (HOSPITAL_COMMUNITY): Payer: Self-pay | Admitting: Internal Medicine

## 2023-05-10 ENCOUNTER — Ambulatory Visit (HOSPITAL_COMMUNITY)
Admission: RE | Admit: 2023-05-10 | Discharge: 2023-05-10 | Disposition: A | Discharge status: Home / Self Care | Payer: PPO | Source: Ambulatory Visit | Attending: Internal Medicine | Admitting: Internal Medicine

## 2023-05-10 VITALS — BP 134/70 | HR 50 | Ht 70.0 in | Wt 186.4 lb

## 2023-05-10 DIAGNOSIS — E785 Hyperlipidemia, unspecified: Secondary | ICD-10-CM | POA: Diagnosis not present

## 2023-05-10 DIAGNOSIS — I2510 Atherosclerotic heart disease of native coronary artery without angina pectoris: Secondary | ICD-10-CM | POA: Diagnosis not present

## 2023-05-10 DIAGNOSIS — Z5181 Encounter for therapeutic drug level monitoring: Secondary | ICD-10-CM | POA: Diagnosis not present

## 2023-05-10 DIAGNOSIS — Z951 Presence of aortocoronary bypass graft: Secondary | ICD-10-CM | POA: Diagnosis not present

## 2023-05-10 DIAGNOSIS — I4819 Other persistent atrial fibrillation: Secondary | ICD-10-CM | POA: Insufficient documentation

## 2023-05-10 DIAGNOSIS — I10 Essential (primary) hypertension: Secondary | ICD-10-CM | POA: Diagnosis not present

## 2023-05-10 DIAGNOSIS — G4733 Obstructive sleep apnea (adult) (pediatric): Secondary | ICD-10-CM | POA: Diagnosis not present

## 2023-05-10 DIAGNOSIS — Z7901 Long term (current) use of anticoagulants: Secondary | ICD-10-CM | POA: Insufficient documentation

## 2023-05-10 DIAGNOSIS — D6869 Other thrombophilia: Secondary | ICD-10-CM | POA: Diagnosis not present

## 2023-05-10 DIAGNOSIS — Z79899 Other long term (current) drug therapy: Secondary | ICD-10-CM

## 2023-05-10 LAB — MAGNESIUM: Magnesium: 2.3 mg/dL (ref 1.7–2.4)

## 2023-05-10 LAB — BASIC METABOLIC PANEL
Anion gap: 7 (ref 5–15)
BUN: 18 mg/dL (ref 8–23)
CO2: 27 mmol/L (ref 22–32)
Calcium: 9.3 mg/dL (ref 8.9–10.3)
Chloride: 104 mmol/L (ref 98–111)
Creatinine, Ser: 1.05 mg/dL (ref 0.61–1.24)
GFR, Estimated: >60 mL/min (ref >60)
Glucose, Bld: 87 mg/dL (ref 70–99)
Potassium: 4.2 mmol/L (ref 3.5–5.1)
Sodium: 138 mmol/L (ref 135–145)

## 2023-05-10 NOTE — Progress Notes (Signed)
Primary Care Physician: Myrlene Broker, MD Primary Cardiologist: Dr Eden Emms Primary Electrophysiologist: Dr [name] Referring Physician: Dr Emily Filbert is a 72 y.o. male with a history of coronary artery disease status post three-vessel CABG, hypertension, OSA, hyperlipidemia, and atrial fibrillation.  He was found to be in atrial fibrillation incidentally on office visit 08/30/2017.  He was put on Eliquis at the time.  Beta-blockers were not started due to resting bradycardia.  January 2020 he was noted increasing dyspnea on exertion.  He had a cardioversion 11/28/18 and was started on amiodarone. He does report today that he was diagnosed with OSA a few years ago but could not tolerate CPAP therapy. He is on Eliquis for a CHADS2VASC score of 3. Patient is s/p afib ablation 03/19/20. He had recurrent persistent afib and is s/p DCCV on 01/13/21. He initially did well but went back into persistent afib and underwent repeat ablation with Dr [name] on 10/25/22.  On follow up today, patient has been in persistent afib on his Kardia mobile device. Also in afib when he saw Dr Eden Emms on 12/08/22. He is scheduled for DCCV on 01/04/23. No bleeding issues on anticoagulation.   On follow up 03/22/23, he is here today for Tikosyn admission. No recent benadryl use. No missed doses of Xarelto. No new medications since pharmacist review.   On follow up 04/01/23 he is s/p Tikosyn admission 6/11-14/24. He did not require cardioversion. Dose reduced due to Qtc lengthening. Monitored until discharge on telemetry which demonstrates Afib > SB. Currently on Tikosyn 250 mcg BID. He notes via Kardiamobile device several possible episodes of Afib since he has been home. No missed doses of Tikosyn or Xarelto.  On follow up 05/10/23, he is here for 1 month Tikosyn surveillance. He has had no episodes of Afib since last office visit. He feels noticeably better and has more energy. He is on Tikosyn 250 mcg BID. No  new medications. No missed doses of Tikosyn or Xarelto.   Today, he denies symptoms of palpitations, chest pain, orthopnea, PND, lower extremity edema, dizziness, presyncope, syncope, bleeding, or neurologic sequela. The patient is tolerating medications without difficulties and is otherwise without complaint today.    Atrial Fibrillation Risk Factors:  he does have symptoms or diagnosis of sleep apnea. he has an Doctor, hospital.  he does not have a history of rheumatic fever. he does not have a history of alcohol use. The patient does not have a history of early familial atrial fibrillation or other arrhythmias.  he has a BMI of Body mass index is 26.75 kg/m.Marland Kitchen Filed Weights   05/10/23 0936  Weight: 84.6 kg    Family History  Problem Relation Age of Onset   Heart attack Mother    Heart disease Mother    Heart attack Father    Heart disease Father    Rectal cancer Neg Hx    Stomach cancer Neg Hx    Pancreatic cancer Neg Hx    Esophageal cancer Neg Hx    Colon polyps Neg Hx    Colon cancer Neg Hx     Atrial Fibrillation Management history:  Previous antiarrhythmic drugs: amiodarone, tikosyn Previous cardioversions: 11/2018, 01/13/21 Previous ablations: 03/19/20, 10/25/22 CHADS2VASC score: 3 Anticoagulation history: Eliquis   Past Medical History:  Diagnosis Date   Aortic aneurysm [facility]) Endograf 2009   Arthritis    Atrial fibrillation (HCC)    Bradycardia    CAD (coronary artery disease) CABG 03/2000  Median sternotomy for coronary artery bypass grafting x 3 (left   HTN (hypertension)    Hx of CABG 2001   severe 3 vessel disease   Hx of colonic polyp 06/2010   Hyperplastic    Hyperlipidemia    Myocardial infarction [facility])    Sleep apnea    Past Surgical History:  Procedure Laterality Date   ATRIAL FIBRILLATION ABLATION N/A 03/19/2020   Procedure: ATRIAL FIBRILLATION ABLATION;  Surgeon: Regan Lemming, MD;  Location: [facility] INVASIVE CV LAB;  Service: Cardiovascular;   Laterality: N/A;   ATRIAL FIBRILLATION ABLATION N/A 10/25/2022   Procedure: ATRIAL FIBRILLATION ABLATION;  Surgeon: Regan Lemming, MD;  Location: [facility] INVASIVE CV LAB;  Service: Cardiovascular;  Laterality: N/A;   CARDIOVERSION N/A 11/28/2018   Procedure: CARDIOVERSION;  Surgeon: Parke Poisson, MD;  Location: [facility] ENDOSCOPY;  Service: Cardiovascular;  Laterality: N/A;   CARDIOVERSION N/A 01/13/2021   Procedure: CARDIOVERSION;  Surgeon: Chrystie Nose, MD;  Location: [facility] ENDOSCOPY;  Service: Cardiovascular;  Laterality: N/A;   CARDIOVERSION N/A 01/04/2023   Procedure: CARDIOVERSION;  Surgeon: Meriam Sprague, MD;  Location: [facility] ENDOSCOPY;  Service: Cardiovascular;  Laterality: N/A;   COLONOSCOPY     CORONARY ARTERY BYPASS GRAFT     AAA stent graft on 12/01/2007 at [facility]  coronary bypass in 2001. Dr. Cornelius Moras.   DRUG INDUCED ENDOSCOPY Right 02/18/2020   Procedure: DRUG INDUCED ENDOSCOPY;  Surgeon: Christia Reading, MD;  Location: [facility];  Service: ENT;  Laterality: Right;   FOOT SURGERY Left 07/19/2017   IMPLANTATION OF HYPOGLOSSAL NERVE STIMULATOR Right 07/30/2020   Procedure: IMPLANTATION OF HYPOGLOSSAL NERVE STIMULATOR Implantation of Chest wall respirator Sensor Electrode Electronic Analysis of Implanted Neurostimulator Pulse Generator;  Surgeon: Christia Reading, MD;  Location: [facility];  Service: ENT;  Laterality: Right;   INGUINAL HERNIA REPAIR Bilateral 08/06/2022   Procedure: LAPAROSCOPIC BILATERAL INGUINAL HERNIA REPAIR WITH MESH AND PRIMARY CLOSURE OF UMBILICAL HERNIA;  Surgeon: Quentin Ore, MD;  Location: [facility]S;  Service: General;  Laterality: Bilateral;   LEFT HEART CATHETERIZATION WITH CORONARY ANGIOGRAM N/A 10/26/2013   Procedure: LEFT HEART CATHETERIZATION WITH CORONARY ANGIOGRAM;  Surgeon: Peter M Swaziland, MD;  Location: [facility] CATH LAB;  Service: Cardiovascular;  Laterality: N/A;   MASS EXCISION Right 08/10/2019    Procedure: EXCISION OF CYST FROM RIGHT SCALP;  Surgeon: Chevis Pretty III, MD;  Location:  SURGERY CENTER;  Service: General;  Laterality: Right;    Current Outpatient Medications  Medication Sig Dispense Refill   acetaminophen (TYLENOL) 500 MG tablet Take 1,000 mg by mouth every 4 (four) hours as needed for moderate pain.     albuterol (VENTOLIN HFA) 108 (90 Base) MCG/ACT inhaler INHALE 2 PUFFS INTO THE LUNGS EVERY 6 HOURS AS NEEDED FOR WHEEZING OR SHORTNESS OF BREATH (Patient taking differently: Inhale 2 puffs into the lungs See admin instructions. Inhale 2 puffs at bedtime, may inhale 2 puffs every 6 hours as needed for shortness of breath) 54 g 1   aspirin EC 81 MG EC tablet Take 1 tablet (81 mg total) by mouth daily. (Patient taking differently: Take 81 mg by mouth in the morning.)     Biotin 96045 MCG TABS Take 10,000 mcg by mouth in the morning.     cholecalciferol (VITAMIN D) 25 MCG (1000 UT) tablet Take 1,000 Units by mouth every evening.     cyclobenzaprine (FLEXERIL) 5 MG tablet Take 1 tablet (5 mg total) by mouth  3 (three) times daily as needed for muscle spasms. 30 tablet 1   diclofenac sodium (VOLTAREN) 1 % GEL Apply 2 g topically 4 (four) times daily. (Patient taking differently: Apply 2 g topically 4 (four) times daily as needed (pain).) 100 g 3   divalproex (DEPAKOTE ER) 500 MG 24 hr tablet TAKE 1 TABLET(500 MG) BY MOUTH DAILY (Patient taking differently: Take 500 mg by mouth every evening.) 90 tablet 1   dofetilide (TIKOSYN) 250 MCG capsule Take 1 capsule (250 mcg total) by mouth 2 (two) times daily. 60 capsule 3   doxycycline (VIBRAMYCIN) 100 MG capsule Take 100 mg by mouth every evening.     Evolocumab (REPATHA SURECLICK) 140 MG/ML SOAJ ADMINISTER 1 ML UNDER THE SKIN EVERY 14 DAYS 6 mL 3   ezetimibe (ZETIA) 10 MG tablet TAKE 1 TABLET(10 MG) BY MOUTH DAILY (Patient taking differently: Take 10 mg by mouth in the morning.) 90 tablet 3   furosemide (LASIX) 20 MG tablet Take 1  tablet (20 MG) By mouth twice daily. As needed, patient may take 20 MG (1 tablet) additional Lasix PRN by mouth daily x 4 days as directed per [employer] HF Study PRN plan (Patient taking differently: Take 20 mg by mouth 2 (two) times daily. Take 1 tablet (20 MG) By mouth twice daily. As needed, patient may take 20 MG (1 tablet) additional Lasix PRN by mouth daily x 4 days as directed per [employer] HF Study PRN plan) 180 tablet 3   gabapentin (NEURONTIN) 300 MG capsule TAKE 1 CAPSULE(300 MG) BY MOUTH TWICE DAILY 180 capsule 3   Glucosamine-Chondroitin (GLUCOSAMINE CHONDR COMPLEX PO) Take 500 mg by mouth 2 (two) times daily.      guaiFENesin (MUCINEX) 600 MG 12 hr tablet Take 600 mg by mouth 2 (two) times daily.     hydrALAZINE (APRESOLINE) 25 MG tablet TAKE 1 TABLET BY MOUTH EVERY MORNING AND AT BEDTIME 180 tablet 3   isosorbide mononitrate (IMDUR) 30 MG 24 hr tablet TAKE 1 TABLET BY MOUTH EVERY DAY (Patient taking differently: 30 mg in the morning.) 90 tablet 3   montelukast (SINGULAIR) 10 MG tablet TAKE 1 TABLET(10 MG) BY MOUTH AT BEDTIME 90 tablet 3   Multiple Vitamin (MULTIVITAMIN WITH MINERALS) TABS tablet Take 1 tablet by mouth every evening.     nitroGLYCERIN (NITROSTAT) 0.4 MG SL tablet Place 1 tablet (0.4 mg total) under the tongue every 5 (five) minutes as needed for chest pain. 25 tablet 11   Omega-3 Fatty Acids (FISH OIL) 1200 MG CAPS Take 1,200 mg by mouth 2 (two) times daily.     potassium chloride (KLOR-CON) 10 MEQ tablet As needed patient may take 10 mEq additional Potassium PRN by mouth daily x 4 days as directed per [employer] HF Study PRN plan (Patient taking differently: Take 10 mEq by mouth as needed. As needed patient may take 10 mEq additional Potassium PRN by mouth daily x 4 days as directed per [employer] HF Study PRN plan) 90 tablet 3   potassium chloride SA (KLOR-CON M) 20 MEQ tablet TAKE 1 TABLET BY MOUTH EVERY DAY (Patient taking differently:  Take 20 mEq by mouth in the morning.) 90 tablet 3   ramipril (ALTACE) 10 MG capsule TAKE 2 CAPSULES(20 MG) BY MOUTH DAILY (Patient taking differently: Take 10 mg by mouth every evening.) 180 capsule 3   tamsulosin (FLOMAX) 0.4 MG CAPS capsule TAKE 1 CAPSULE(0.4 MG) BY MOUTH DAILY (Patient taking differently: Take 0.4 mg by mouth  every evening.) 90 capsule 3   Tetrahydrozoline HCl (VISINE OP) Place 1 drop into both eyes daily as needed (irritation).     vitamin C (ASCORBIC ACID) 500 MG tablet Take 500 mg by mouth daily.     XARELTO 20 MG TABS tablet TAKE 1 TABLET(20 MG) BY MOUTH DAILY WITH SUPPER 90 tablet 3   zolpidem (AMBIEN CR) 12.5 MG CR tablet TAKE 1 TABLET(12.5 MG) BY MOUTH AT BEDTIME AS NEEDED FOR SLEEP. 30 tablet 0   No current facility-administered medications for this encounter.    Allergies  Allergen Reactions   Statins     Myalgias with elevated CPKs    ROS- All systems are reviewed and negative except as per the HPI above.  Physical Exam: Vitals:   05/10/23 0936  BP: 134/70  Pulse: (!) 50  Weight: 84.6 kg  Height: 5\' 10"  (1.778 m)    GEN- The patient is well appearing, alert and oriented x 3 today.   Neck - no JVD or carotid bruit noted Lungs- Clear to ausculation bilaterally, normal work of breathing Heart- Regular rate and rhythm, no murmurs, rubs or gallops, PMI not laterally displaced Extremities- no clubbing, cyanosis, or edema Skin - no rash or ecchymosis noted   Wt Readings from Last 3 Encounters:  05/10/23 84.6 kg  05/02/23 84.1 kg  04/01/23 85.5 kg    EKG today demonstrates  Vent. rate 50 BPM PR interval 206 ms QRS duration 104 ms QT/QTcB 458/417 ms P-R-T axes -71 -18 -41 Unusual P axis, possible ectopic atrial bradycardia Minimal voltage criteria for LVH, may be normal variant ( R in aVL ) Nonspecific T wave abnormality Abnormal ECG When compared with ECG of 25-Mar-2023 11:20, PREVIOUS ECG IS PRESENT  Echo 01/21/20 demonstrated   1. Left  ventricular ejection fraction, by estimation, is 55 to 60%. The  left ventricle has normal function. Left ventricular endocardial border  not optimally defined to evaluate regional wall motion. There is mild left  ventricular hypertrophy. Left  ventricular diastolic parameters are consistent with Grade II diastolic  dysfunction (pseudonormalization).   2. Right ventricular systolic function is normal. The right ventricular  size is mildly enlarged. There is mildly elevated pulmonary artery  systolic pressure.   3. Left atrial size was moderately dilated.   4. Right atrial size was mildly dilated.   5. The mitral valve is normal in structure. Mild mitral valve  regurgitation.   6. The aortic valve is tricuspid. Aortic valve regurgitation is mild.  Mild aortic valve sclerosis is present, with no evidence of aortic valve  stenosis.   7. The inferior vena cava is normal in size with greater than 50%  respiratory variability, suggesting right atrial pressure of 3 mmHg.   Epic records are reviewed at length today  CHA2DS2-VASc Score = 3  The patient's score is based upon: CHF History: 0 HTN History: 1 Diabetes History: 0 Stroke History: 0 Vascular Disease History: 1 Age Score: 1 Gender Score: 0      ASSESSMENT AND PLAN: 1. Persistent Atrial Fibrillation (ICD10:  I48.19) The patient's CHA2DS2-VASc score is 3, indicating a 3.2% annual risk of stroke.   S/p afib ablation 03/19/20 with repeat ablation 10/25/22 S/p Tikosyn admission 6/11-14/24  Qtc stable Bmet and mag drawn today. Continue Tikosyn 250 mcg BID. F/u 3 months for Tikosyn surveillance.   2. Secondary Hypercoagulable State (ICD10:  D68.69) The patient is at significant risk for stroke/thromboembolism based upon his CHA2DS2-VASc Score of 3.  Continue Xarelto No  missed doses  3. HTN Stable, no changes today.  4. Obstructive sleep apnea Inspire device placed 07/30/20 Followed by Dr Jenne Pane and Dr Mayford Knife   5. CAD S/p  CABG. No anginal symptoms.    F/u 3 months for Tikosyn surveillance.     Lake Bells, PA-C [facility] [address] [place], [place] [address] [phone] 05/10/2023 9:58 AM

## 2023-05-11 ENCOUNTER — Other Ambulatory Visit: Payer: Self-pay | Admitting: *Deleted

## 2023-05-11 DIAGNOSIS — Z9889 Other specified postprocedural states: Secondary | ICD-10-CM

## 2023-05-16 ENCOUNTER — Other Ambulatory Visit: Payer: Self-pay | Admitting: Cardiology

## 2023-05-16 ENCOUNTER — Other Ambulatory Visit: Payer: Self-pay | Admitting: Internal Medicine

## 2023-05-16 DIAGNOSIS — N401 Enlarged prostate with lower urinary tract symptoms: Secondary | ICD-10-CM

## 2023-05-17 ENCOUNTER — Encounter: Payer: PPO | Admitting: Internal Medicine

## 2023-05-20 ENCOUNTER — Telehealth: Payer: Self-pay | Admitting: Internal Medicine

## 2023-05-20 NOTE — Telephone Encounter (Signed)
Pt hasn't seen MD since 04/2022. Need appt for refills.Marland KitchenRaechel Chute

## 2023-05-20 NOTE — Telephone Encounter (Signed)
Prescription Request  05/20/2023  LOV: Visit date not found  What is the name of the medication or equipment? zolpidem  Have you contacted your pharmacy to request a refill? Yes   Which pharmacy would you like this sent to?  [id number] [place], [place] - [address] AT [facility] [address] [facility] [address] [facility] [address] [place] [address] Phone: [phone] Fax: [phone]   Patient notified that their request is being sent to the clinical staff for review and that they should receive a response within 2 business days.   Please advise at Mobile [phone] (mobile)

## 2023-05-23 NOTE — Telephone Encounter (Signed)
Pt called back stating he has an upcoming appt and can he get a temporary refill until appt.

## 2023-05-30 ENCOUNTER — Ambulatory Visit (INDEPENDENT_AMBULATORY_CARE_PROVIDER_SITE_OTHER): Payer: PPO | Admitting: Internal Medicine

## 2023-05-30 ENCOUNTER — Encounter: Payer: Self-pay | Admitting: Internal Medicine

## 2023-05-30 VITALS — BP 140/70 | HR 48 | Temp 98.1°F | Wt 189.0 lb

## 2023-05-30 DIAGNOSIS — Z0000 Encounter for general adult medical examination without abnormal findings: Secondary | ICD-10-CM | POA: Diagnosis not present

## 2023-05-30 DIAGNOSIS — H9312 Tinnitus, left ear: Secondary | ICD-10-CM

## 2023-05-30 DIAGNOSIS — E782 Mixed hyperlipidemia: Secondary | ICD-10-CM | POA: Diagnosis not present

## 2023-05-30 DIAGNOSIS — N400 Enlarged prostate without lower urinary tract symptoms: Secondary | ICD-10-CM

## 2023-05-30 DIAGNOSIS — T466X5A Adverse effect of antihyperlipidemic and antiarteriosclerotic drugs, initial encounter: Secondary | ICD-10-CM | POA: Diagnosis not present

## 2023-05-30 DIAGNOSIS — I719 Aortic aneurysm of unspecified site, without rupture: Secondary | ICD-10-CM | POA: Diagnosis not present

## 2023-05-30 DIAGNOSIS — I4819 Other persistent atrial fibrillation: Secondary | ICD-10-CM

## 2023-05-30 DIAGNOSIS — I25728 Atherosclerosis of autologous artery coronary artery bypass graft(s) with other forms of angina pectoris: Secondary | ICD-10-CM | POA: Diagnosis not present

## 2023-05-30 DIAGNOSIS — G43809 Other migraine, not intractable, without status migrainosus: Secondary | ICD-10-CM | POA: Diagnosis not present

## 2023-05-30 DIAGNOSIS — G720 Drug-induced myopathy: Secondary | ICD-10-CM | POA: Diagnosis not present

## 2023-05-30 DIAGNOSIS — I10 Essential (primary) hypertension: Secondary | ICD-10-CM

## 2023-05-30 DIAGNOSIS — D6869 Other thrombophilia: Secondary | ICD-10-CM

## 2023-05-30 DIAGNOSIS — G44009 Cluster headache syndrome, unspecified, not intractable: Secondary | ICD-10-CM

## 2023-05-30 MED ORDER — ALBUTEROL SULFATE HFA 108 (90 BASE) MCG/ACT IN AERS: 2.0000 | INHALATION_SPRAY | Freq: Four times a day (QID) | RESPIRATORY_TRACT | 1 refills | Status: DC | PRN

## 2023-05-30 MED ORDER — GABAPENTIN 300 MG PO CAPS: ORAL_CAPSULE | ORAL | 3 refills | Status: DC

## 2023-05-30 MED ORDER — DIVALPROEX SODIUM ER 500 MG PO TB24
500.0000 mg | ORAL_TABLET | Freq: Every day | ORAL | 3 refills | Status: DC
Start: 2023-05-30 — End: 2024-06-01

## 2023-05-30 MED ORDER — ZOLPIDEM TARTRATE ER 12.5 MG PO TBCR: 12.5000 mg | EXTENDED_RELEASE_TABLET | Freq: Every day | ORAL | 1 refills | Status: DC

## 2023-05-30 NOTE — Progress Notes (Unsigned)
   Subjective:   Patient ID: [name] Brown, male    DOB: 25-Jan-1951, 72 y.o.   MRN: [id number]  HPI The patient is here for physical.  PMH, [facility], social history reviewed and updated  Review of Systems  Objective:  Physical Exam  Vitals:   05/30/23 1523 05/30/23 1525  BP: (!) 140/70 (!) 140/70  Pulse: (!) 48   Temp: 98.1 F (36.7 C)   TempSrc: Oral   SpO2: 95%   Weight: 189 lb (85.7 kg)     Assessment & Plan:

## 2023-05-31 ENCOUNTER — Encounter: Payer: Self-pay | Admitting: Internal Medicine

## 2023-05-31 DIAGNOSIS — H9312 Tinnitus, left ear: Secondary | ICD-10-CM | POA: Insufficient documentation

## 2023-05-31 DIAGNOSIS — Z0000 Encounter for general adult medical examination without abnormal findings: Secondary | ICD-10-CM | POA: Insufficient documentation

## 2023-05-31 NOTE — Assessment & Plan Note (Signed)
BP at goal today on ramipril 20 mg daily and imdur 30 mg daily and hydralazine 25 mg BID and lasix 20 mg daily. Recent CMP at goal will continue.

## 2023-05-31 NOTE — Assessment & Plan Note (Signed)
Taking depakote daily and will continue no migraines lately.

## 2023-05-31 NOTE — Assessment & Plan Note (Signed)
Gets monitoring yearly through cardiology.

## 2023-05-31 NOTE — Assessment & Plan Note (Signed)
Taking repatha and zetia and asked him to have cardiology check lipid panel with next labs to avoid additional lab draw.

## 2023-05-31 NOTE — Assessment & Plan Note (Signed)
Taking flomax 0.4 mg daily and will continue.

## 2023-05-31 NOTE — Assessment & Plan Note (Signed)
Still taking xarelto 20 mg daily and no signs of bleeding. Asked to monitor for dark stools.

## 2023-05-31 NOTE — Assessment & Plan Note (Signed)
Flu shot yearly. Pneumonia complete. Shingrix complete. Tetanus due at pharmacy. Colonoscopy up to date. Counseled about sun safety and mole surveillance. Counseled about the dangers of distracted driving. Given 10 year screening recommendations.

## 2023-05-31 NOTE — Assessment & Plan Note (Signed)
Referral to audiology.

## 2023-05-31 NOTE — Assessment & Plan Note (Signed)
Taking repatha and zetia.

## 2023-05-31 NOTE — Assessment & Plan Note (Signed)
In sinus since starting tikosyn and on xarelto. Feeling better in sinus seeing cardiology and they manage his medications.

## 2023-05-31 NOTE — Assessment & Plan Note (Signed)
With stable angina and on imdur 30 mg daily to treat this. Is taking aspirin and xarelto. On repatha and zetia.

## 2023-06-02 ENCOUNTER — Ambulatory Visit: Payer: PPO | Admitting: Vascular Surgery

## 2023-06-02 ENCOUNTER — Encounter: Payer: Self-pay | Admitting: Vascular Surgery

## 2023-06-02 ENCOUNTER — Ambulatory Visit (HOSPITAL_COMMUNITY)
Admission: RE | Admit: 2023-06-02 | Discharge: 2023-06-02 | Disposition: A | Discharge status: Home / Self Care | Payer: PPO | Source: Ambulatory Visit | Attending: Vascular Surgery | Admitting: Vascular Surgery

## 2023-06-02 VITALS — BP 166/63 | HR 44 | Temp 97.9°F | Resp 20 | Ht 70.0 in | Wt 190.0 lb

## 2023-06-02 DIAGNOSIS — Z9889 Other specified postprocedural states: Secondary | ICD-10-CM | POA: Insufficient documentation

## 2023-06-02 DIAGNOSIS — I6522 Occlusion and stenosis of left carotid artery: Secondary | ICD-10-CM | POA: Diagnosis not present

## 2023-06-02 NOTE — Progress Notes (Signed)
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  On his most recent follow-up study the aneurysm has shrunk down to 3.1 cm there was no evidence of endoleak.  On today's study the aorta could not be visualized.  Given that the aneurysm has shrunk in size I am reluctant to consider a CT angio and I think it be best just to try to repeat his ultrasound in 4 to 6 weeks.  The patient has not significantly obese and hopefully will have better results on a follow-up ultrasound.  If not we would have to consider CT angiogram as it has been 2 years since his last study.  Given that I will be retiring he will likely have to be seen on the PA schedule for his follow-up visit.  Fortunately he is not a smoker.  He quit 1993.  HPI:   [name] Brown is a pleasant 72 y.o. male who I last saw on 05/27/2021 for follow-up after repair of his abdominal aortic aneurysm.  He had a 5.3 cm infrarenal aneurysm repaired in 2009.  In August 2022 this measured 3.1 cm in maximum diameter.  Recommended a 2-year follow-up study.  Since I saw him last, he denies any abdominal pain or back pain.  He does have a history of atrial fibrillation and is undergone cardioversion and some ablation procedures and that was A-fib is under good control.  There have been no other significant changes to his medical history.  He is on Xarelto for A-fib.  He is on aspirin.  Past Medical History:  Diagnosis Date   Aortic aneurysm [facility]) Endograf 2009   Arthritis    Atrial fibrillation (HCC)    Bradycardia    CAD (coronary artery disease) CABG 03/2000   Median sternotomy for coronary artery bypass grafting x 3 (left   HTN (hypertension)    Hx of CABG 2001   severe 3 vessel disease   Hx of colonic polyp 06/2010   Hyperplastic    Hyperlipidemia    Myocardial infarction [facility])     Sleep apnea     Family History  Problem Relation Age of Onset   Heart attack Mother    Heart disease Mother    Heart attack Father    Heart disease Father    Rectal cancer Neg Hx    Stomach cancer Neg Hx    Pancreatic cancer Neg Hx    Esophageal cancer Neg Hx    Colon polyps Neg Hx    Colon cancer Neg Hx     SOCIAL HISTORY: Social History   Tobacco Use   Smoking status: Former    Current packs/day: 0.00    Average packs/day: 2.0 packs/day for 30.0 years (60.0 ttl pk-yrs)    Types: Cigarettes    Start date: 01/04/1962    Quit date: 01/05/1992    Years since quitting: 31.4   Smokeless tobacco: Never   Tobacco comments:    Former smoker quit 19 yrs ago 11/22/22  Substance Use Topics   Alcohol use: No    Allergies  Allergen Reactions   Statins     Myalgias with elevated CPKs    Current Outpatient Medications  Medication Sig Dispense Refill   acetaminophen (TYLENOL) 500 MG tablet Take 1,000 mg by mouth every 4 (four) hours as needed for moderate pain.  albuterol (VENTOLIN HFA) 108 (90 Base) MCG/ACT inhaler Inhale 2 puffs into the lungs every 6 (six) hours as needed for wheezing or shortness of breath. 54 g 1   aspirin EC 81 MG EC tablet Take 1 tablet (81 mg total) by mouth daily. (Patient taking differently: Take 81 mg by mouth in the morning.)     Biotin 16109 MCG TABS Take 10,000 mcg by mouth in the morning.     cholecalciferol (VITAMIN D) 25 MCG (1000 UT) tablet Take 1,000 Units by mouth every evening.     cyclobenzaprine (FLEXERIL) 5 MG tablet Take 1 tablet (5 mg total) by mouth 3 (three) times daily as needed for muscle spasms. 30 tablet 1   diclofenac sodium (VOLTAREN) 1 % GEL Apply 2 g topically 4 (four) times daily. (Patient taking differently: Apply 2 g topically 4 (four) times daily as needed (pain).) 100 g 3   divalproex (DEPAKOTE ER) 500 MG 24 hr tablet Take 1 tablet (500 mg total) by mouth daily. 90 tablet 3   dofetilide (TIKOSYN) 250 MCG capsule Take 1  capsule (250 mcg total) by mouth 2 (two) times daily. 60 capsule 3   doxycycline (VIBRAMYCIN) 100 MG capsule Take 100 mg by mouth every evening.     Evolocumab (REPATHA SURECLICK) 140 MG/ML SOAJ ADMINISTER 1 ML UNDER THE SKIN EVERY 14 DAYS 6 mL 3   ezetimibe (ZETIA) 10 MG tablet TAKE 1 TABLET(10 MG) BY MOUTH DAILY (Patient taking differently: Take 10 mg by mouth in the morning.) 90 tablet 3   furosemide (LASIX) 20 MG tablet Take 1 tablet (20 MG) By mouth twice daily. As needed, patient may take 20 MG (1 tablet) additional Lasix PRN by mouth daily x 4 days as directed per [employer] HF Study PRN plan (Patient taking differently: Take 20 mg by mouth 2 (two) times daily. Take 1 tablet (20 MG) By mouth twice daily. As needed, patient may take 20 MG (1 tablet) additional Lasix PRN by mouth daily x 4 days as directed per [employer] HF Study PRN plan) 180 tablet 3   gabapentin (NEURONTIN) 300 MG capsule TAKE 1 CAPSULE(300 MG) BY MOUTH TWICE DAILY 180 capsule 3   Glucosamine-Chondroitin (GLUCOSAMINE CHONDR COMPLEX PO) Take 500 mg by mouth 2 (two) times daily.      guaiFENesin (MUCINEX) 600 MG 12 hr tablet Take 600 mg by mouth 2 (two) times daily.     hydrALAZINE (APRESOLINE) 25 MG tablet TAKE 1 TABLET BY MOUTH EVERY MORNING AND EVERY NIGHT AT BEDTIME 180 tablet 2   isosorbide mononitrate (IMDUR) 30 MG 24 hr tablet TAKE 1 TABLET BY MOUTH EVERY DAY (Patient taking differently: 30 mg in the morning.) 90 tablet 3   montelukast (SINGULAIR) 10 MG tablet TAKE 1 TABLET(10 MG) BY MOUTH AT BEDTIME 90 tablet 3   Multiple Vitamin (MULTIVITAMIN WITH MINERALS) TABS tablet Take 1 tablet by mouth every evening.     nitroGLYCERIN (NITROSTAT) 0.4 MG SL tablet Place 1 tablet (0.4 mg total) under the tongue every 5 (five) minutes as needed for chest pain. 25 tablet 11   Omega-3 Fatty Acids (FISH OIL) 1200 MG CAPS Take 1,200 mg by mouth 2 (two) times daily.     potassium chloride (KLOR-CON) 10 MEQ tablet As needed  patient may take 10 mEq additional Potassium PRN by mouth daily x 4 days as directed per [employer] Research HF Study PRN plan (Patient taking differently: Take 10 mEq by mouth as needed. As needed patient may take 10  mEq additional Potassium PRN by mouth daily x 4 days as directed per [employer] Research HF Study PRN plan) 90 tablet 3   potassium chloride SA (KLOR-CON M) 20 MEQ tablet TAKE 1 TABLET BY MOUTH EVERY DAY (Patient taking differently: Take 20 mEq by mouth in the morning.) 90 tablet 3   ramipril (ALTACE) 10 MG capsule TAKE 2 CAPSULES(20 MG) BY MOUTH DAILY (Patient taking differently: Take 10 mg by mouth every evening.) 180 capsule 3   tamsulosin (FLOMAX) 0.4 MG CAPS capsule TAKE 1 CAPSULE(0.4 MG) BY MOUTH DAILY 90 capsule 3   Tetrahydrozoline HCl (VISINE OP) Place 1 drop into both eyes daily as needed (irritation).     vitamin C (ASCORBIC ACID) 500 MG tablet Take 500 mg by mouth daily.     XARELTO 20 MG TABS tablet TAKE 1 TABLET(20 MG) BY MOUTH DAILY WITH SUPPER 90 tablet 3   zolpidem (AMBIEN CR) 12.5 MG CR tablet Take 1 tablet (12.5 mg total) by mouth at bedtime. 90 tablet 1   No current facility-administered medications for this visit.    REVIEW OF SYSTEMS:  [X]  denotes positive finding, [ ]  denotes negative finding Cardiac  Comments:  Chest pain or chest pressure:    Shortness of breath upon exertion:    Short of breath when lying flat:    Irregular heart rhythm:        Vascular    Pain in calf, thigh, or hip brought on by ambulation:    Pain in feet at night that wakes you up from your sleep:     Blood clot in your veins:    Leg swelling:         Pulmonary    Oxygen at home:    Productive cough:     Wheezing:         Neurologic    Sudden weakness in arms or legs:     Sudden numbness in arms or legs:     Sudden onset of difficulty speaking or slurred speech:    Temporary loss of vision in one eye:     Problems with dizziness:         Gastrointestinal    Blood in  stool:     Vomited blood:         Genitourinary    Burning when urinating:     Blood in urine:        Psychiatric    Major depression:         Hematologic    Bleeding problems:    Problems with blood clotting too easily:        Skin    Rashes or ulcers:        Constitutional    Fever or chills:     PHYSICAL EXAM:   Vitals:   06/02/23 0849  BP: (!) 166/63  Pulse: (!) 44  Resp: 20  Temp: 97.9 F (36.6 C)  SpO2: 95%  Weight: 190 lb (86.2 kg)  Height: 5\' 10"  (1.778 m)    GENERAL: The patient is a well-nourished male, in no acute distress. The vital signs are documented above. CARDIAC: There is a regular rate and rhythm.  VASCULAR: I do not detect carotid bruits. He has palpable femoral pulses. On the right side he has a monophasic dorsalis pedis signal with a biphasic posterior tibial signal. On the left side he has a monophasic dorsalis pedis and posterior tibial signal. PULMONARY: There is good air exchange bilaterally without wheezing or rales. ABDOMEN:  Soft and non-tender with normal pitched bowel sounds.  MUSCULOSKELETAL: There are no major deformities or cyanosis. NEUROLOGIC: No focal weakness or paresthesias are detected. SKIN: There are no ulcers or rashes noted. PSYCHIATRIC: The patient has a normal affect.  DATA:    DUPLEX ABDOMINAL AORTA: This study was severely limited because of overlying bowel gas.  The size of the aorta could not be measured.   Waverly Ferrari [facility] [phone]

## 2023-06-17 DIAGNOSIS — Z006 Encounter for examination for normal comparison and control in clinical research program: Secondary | ICD-10-CM

## 2023-06-17 NOTE — Research (Signed)
Alleviate HF Research Study  4 Month F/U  No adverse events or cardiovascular medication changes to report at this time.   Current Outpatient Medications:    acetaminophen (TYLENOL) 500 MG tablet, Take 1,000 mg by mouth every 4 (four) hours as needed for moderate pain., Disp: , Rfl:    albuterol (VENTOLIN HFA) 108 (90 Base) MCG/ACT inhaler, Inhale 2 puffs into the lungs every 6 (six) hours as needed for wheezing or shortness of breath., Disp: 54 g, Rfl: 1   aspirin EC 81 MG EC tablet, Take 1 tablet (81 mg total) by mouth daily. (Patient taking differently: Take 81 mg by mouth in the morning.), Disp: , Rfl:    Biotin 95621 MCG TABS, Take 10,000 mcg by mouth in the morning., Disp: , Rfl:    cholecalciferol (VITAMIN D) 25 MCG (1000 UT) tablet, Take 1,000 Units by mouth every evening., Disp: , Rfl:    cyclobenzaprine (FLEXERIL) 5 MG tablet, Take 1 tablet (5 mg total) by mouth 3 (three) times daily as needed for muscle spasms., Disp: 30 tablet, Rfl: 1   diclofenac sodium (VOLTAREN) 1 % GEL, Apply 2 g topically 4 (four) times daily. (Patient taking differently: Apply 2 g topically 4 (four) times daily as needed (pain).), Disp: 100 g, Rfl: 3   divalproex (DEPAKOTE ER) 500 MG 24 hr tablet, Take 1 tablet (500 mg total) by mouth daily., Disp: 90 tablet, Rfl: 3   dofetilide (TIKOSYN) 250 MCG capsule, Take 1 capsule (250 mcg total) by mouth 2 (two) times daily., Disp: 60 capsule, Rfl: 3   doxycycline (VIBRAMYCIN) 100 MG capsule, Take 100 mg by mouth every evening., Disp: , Rfl:    Evolocumab (REPATHA SURECLICK) 140 MG/ML SOAJ, ADMINISTER 1 ML UNDER THE SKIN EVERY 14 DAYS, Disp: 6 mL, Rfl: 3   ezetimibe (ZETIA) 10 MG tablet, TAKE 1 TABLET(10 MG) BY MOUTH DAILY (Patient taking differently: Take 10 mg by mouth in the morning.), Disp: 90 tablet, Rfl: 3   furosemide (LASIX) 20 MG tablet, Take 1 tablet (20 MG) By mouth twice daily. As needed, patient may take 20 MG (1 tablet) additional Lasix PRN by mouth daily x  4 days as directed per [employer] Research HF Study PRN plan (Patient taking differently: Take 20 mg by mouth 2 (two) times daily. Take 1 tablet (20 MG) By mouth twice daily. As needed, patient may take 20 MG (1 tablet) additional Lasix PRN by mouth daily x 4 days as directed per [employer] HF Study PRN plan), Disp: 180 tablet, Rfl: 3   gabapentin (NEURONTIN) 300 MG capsule, TAKE 1 CAPSULE(300 MG) BY MOUTH TWICE DAILY, Disp: 180 capsule, Rfl: 3   Glucosamine-Chondroitin (GLUCOSAMINE CHONDR COMPLEX PO), Take 500 mg by mouth 2 (two) times daily. , Disp: , Rfl:    guaiFENesin (MUCINEX) 600 MG 12 hr tablet, Take 600 mg by mouth 2 (two) times daily., Disp: , Rfl:    hydrALAZINE (APRESOLINE) 25 MG tablet, TAKE 1 TABLET BY MOUTH EVERY MORNING AND EVERY NIGHT AT BEDTIME, Disp: 180 tablet, Rfl: 2   isosorbide mononitrate (IMDUR) 30 MG 24 hr tablet, TAKE 1 TABLET BY MOUTH EVERY DAY (Patient taking differently: 30 mg in the morning.), Disp: 90 tablet, Rfl: 3   montelukast (SINGULAIR) 10 MG tablet, TAKE 1 TABLET(10 MG) BY MOUTH AT BEDTIME, Disp: 90 tablet, Rfl: 3   Multiple Vitamin (MULTIVITAMIN WITH MINERALS) TABS tablet, Take 1 tablet by mouth every evening., Disp: , Rfl:    nitroGLYCERIN (NITROSTAT) 0.4 MG SL tablet,  Place 1 tablet (0.4 mg total) under the tongue every 5 (five) minutes as needed for chest pain., Disp: 25 tablet, Rfl: 11   Omega-3 Fatty Acids (FISH OIL) 1200 MG CAPS, Take 1,200 mg by mouth 2 (two) times daily., Disp: , Rfl:    potassium chloride (KLOR-CON) 10 MEQ tablet, As needed patient may take 10 mEq additional Potassium PRN by mouth daily x 4 days as directed per [employer] HF Study PRN plan (Patient taking differently: Take 10 mEq by mouth as needed. As needed patient may take 10 mEq additional Potassium PRN by mouth daily x 4 days as directed per [employer] HF Study PRN plan), Disp: 90 tablet, Rfl: 3   potassium chloride SA (KLOR-CON M) 20 MEQ tablet, TAKE 1 TABLET BY  MOUTH EVERY DAY (Patient taking differently: Take 20 mEq by mouth in the morning.), Disp: 90 tablet, Rfl: 3   ramipril (ALTACE) 10 MG capsule, TAKE 2 CAPSULES(20 MG) BY MOUTH DAILY (Patient taking differently: Take 10 mg by mouth every evening.), Disp: 180 capsule, Rfl: 3   tamsulosin (FLOMAX) 0.4 MG CAPS capsule, TAKE 1 CAPSULE(0.4 MG) BY MOUTH DAILY, Disp: 90 capsule, Rfl: 3   Tetrahydrozoline HCl (VISINE OP), Place 1 drop into both eyes daily as needed (irritation)., Disp: , Rfl:    vitamin C (ASCORBIC ACID) 500 MG tablet, Take 500 mg by mouth daily., Disp: , Rfl:    XARELTO 20 MG TABS tablet, TAKE 1 TABLET(20 MG) BY MOUTH DAILY WITH SUPPER, Disp: 90 tablet, Rfl: 3   zolpidem (AMBIEN CR) 12.5 MG CR tablet, Take 1 tablet (12.5 mg total) by mouth at bedtime., Disp: 90 tablet, Rfl: 1

## 2023-06-20 ENCOUNTER — Other Ambulatory Visit: Payer: Self-pay | Admitting: Cardiovascular Disease

## 2023-06-21 ENCOUNTER — Other Ambulatory Visit: Payer: Self-pay

## 2023-06-21 DIAGNOSIS — Z9889 Other specified postprocedural states: Secondary | ICD-10-CM

## 2023-06-21 NOTE — Telephone Encounter (Signed)
Prescription refill request for Xarelto received.  Indication: Afib  Last office visit: 05/10/23 Nelva Bush)  Weight: 86.2kg  Age: 72 Scr: 1.05 (05/10/23)  CrCl: 78.49ml/min  Appropriate dose. Refill sent.

## 2023-06-30 ENCOUNTER — Ambulatory Visit: Payer: PPO | Attending: Internal Medicine | Admitting: Audiologist

## 2023-06-30 DIAGNOSIS — H833X3 Noise effects on inner ear, bilateral: Secondary | ICD-10-CM | POA: Diagnosis not present

## 2023-06-30 DIAGNOSIS — H903 Sensorineural hearing loss, bilateral: Secondary | ICD-10-CM | POA: Diagnosis not present

## 2023-06-30 NOTE — Procedures (Signed)
Outpatient Audiology and [facility] [address] [place], [place]  [address] [phone]  AUDIOLOGICAL  EVALUATION  NAME: [name] Brown     DOB:   1951/03/16      MRN: [id number]                                                                                     DATE: 06/30/2023     REFERENT: Myrlene Broker, MD STATUS: Outpatient DIAGNOSIS: Tinnitus, Noise Induced Sensorineural Hearing Loss Bilaterally    History: [name] was seen for an audiological evaluation due to tinnitus in both ears. The ringing has been present for many years and has been a lot worse in the last year. 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 He was told his hearing loss is 'normal for his age and amount of noise exposure'. [name] denies any pain or pressure in either ear. The tinnitus sounds like a high pitched /eee/ in both ears. No other medical concerns or significant history reported.   Evaluation:  Otoscopy showed a clear view of the tympanic membranes, bilaterally Tympanometry results were consistent with normal middle ear function, bilaterally   Audiometric testing was completed using Conventional Audiometry techniques with insert earphones and supraural headphones. Test results are consistent with normal hearing sloping after 2kHz to a  moderate sensorineural noise notch hearing loss bilaterally. Asymmetry of 25dB with left ear worse present at [name] were obtained at  25dB HL in the right ear and at 30dB HL in the left ear. Word Recognition Testing was completed at  40dB SL and [name] scored 100% in each ear.  Tinnitus pitch and loudness matched to 53dB (3dB SL) 4kHz. Rated this a 10 on a scale of 1-10 with 10 a perfect match.  No residual inhibition with 30 seconds of NBN centered on 4kHz.   Results:  The test results were reviewed with [name] and his wife. [name] has a noise notched  sensorineural hearing loss. [name] is a good candidate for a notched tinnitus therapy hearing aid. He was counseled on the nature and degree of hearing loss. He was given a packet of information on hearing aids, tinnitus, and tinnitus management. He needs to monitor his hearing annually, especially due to the [facility] asymmetry.   Recommendations: Due to [name]'s tinnitus severity, follow up with Dr. [name] at [facility] recommended for hearing aids. Dr. Cammy Copa specializes fitting hearing aids for tinnitus patients. [facility] Address: [address] [place] [address] Appointments : [phone] Monitor asymmetry at [facility] with annual audiometric evaluation. Can follow up with [facility] or wherever he purchases aids.    36 minutes spent testing and counseling on results.   If you have any questions please feel free to contact me at [phone].  Ammie Ferrier  Audiologist, Au.D., CCC-A 06/30/2023  9:35 AM  Cc: Myrlene Broker, MD

## 2023-07-14 ENCOUNTER — Ambulatory Visit (HOSPITAL_COMMUNITY)
Admission: RE | Admit: 2023-07-14 | Discharge: 2023-07-14 | Disposition: A | Discharge status: Home / Self Care | Payer: PPO | Source: Ambulatory Visit | Attending: Vascular Surgery | Admitting: Vascular Surgery

## 2023-07-14 ENCOUNTER — Ambulatory Visit: Payer: PPO | Admitting: Physician Assistant

## 2023-07-14 VITALS — BP 160/82 | HR 48 | Temp 98.2°F | Ht 70.0 in | Wt 190.4 lb

## 2023-07-14 DIAGNOSIS — Z9889 Other specified postprocedural states: Secondary | ICD-10-CM | POA: Insufficient documentation

## 2023-07-14 DIAGNOSIS — I6522 Occlusion and stenosis of left carotid artery: Secondary | ICD-10-CM | POA: Diagnosis not present

## 2023-07-14 NOTE — Progress Notes (Signed)
Office Note     CC:  follow up Requesting Provider:  Myrlene Broker, *  HPI: [name] Brown is a 72 y.o. ([birth year]) male who presents for surveillance of AAA.  He underwent endovascular repair of 5.3 cm infrarenal abdominal aortic aneurysm in 2009.  He denies any new or changing abdominal or back pain.  He was last seen in the office last month with Dr. Edilia Bo however the distal aorta was not well-visualized due to bowel gas.  He comes in today for repeat attempt at ultrasound of the endovascularly repaired AAA.  He continues to take Xarelto for atrial fibrillation.  He is also on aspirin daily.   Past Medical History:  Diagnosis Date   Aortic aneurysm [facility]) Endograf 2009   Arthritis    Atrial fibrillation (HCC)    Bradycardia    CAD (coronary artery disease) CABG 03/2000   Median sternotomy for coronary artery bypass grafting x 3 (left   HTN (hypertension)    Hx of CABG 2001   severe 3 vessel disease   Hx of colonic polyp 06/2010   Hyperplastic    Hyperlipidemia    Myocardial infarction [facility])    Sleep apnea     Past Surgical History:  Procedure Laterality Date   ATRIAL FIBRILLATION ABLATION N/A 03/19/2020   Procedure: ATRIAL FIBRILLATION ABLATION;  Surgeon: Regan Lemming, MD;  Location: [facility] INVASIVE CV LAB;  Service: Cardiovascular;  Laterality: N/A;   ATRIAL FIBRILLATION ABLATION N/A 10/25/2022   Procedure: ATRIAL FIBRILLATION ABLATION;  Surgeon: Regan Lemming, MD;  Location: [facility] INVASIVE CV LAB;  Service: Cardiovascular;  Laterality: N/A;   CARDIOVERSION N/A 11/28/2018   Procedure: CARDIOVERSION;  Surgeon: Parke Poisson, MD;  Location: [facility] ENDOSCOPY;  Service: Cardiovascular;  Laterality: N/A;   CARDIOVERSION N/A 01/13/2021   Procedure: CARDIOVERSION;  Surgeon: Chrystie Nose, MD;  Location: [facility] ENDOSCOPY;  Service: Cardiovascular;  Laterality: N/A;   CARDIOVERSION N/A 01/04/2023   Procedure: CARDIOVERSION;  Surgeon: Meriam Sprague, MD;  Location:  [facility] ENDOSCOPY;  Service: Cardiovascular;  Laterality: N/A;   COLONOSCOPY     CORONARY ARTERY BYPASS GRAFT     AAA stent graft on 12/01/2007 at [facility]  coronary bypass in 2001. Dr. Cornelius Moras.   DRUG INDUCED ENDOSCOPY Right 02/18/2020   Procedure: DRUG INDUCED ENDOSCOPY;  Surgeon: Christia Reading, MD;  Location: [facility];  Service: ENT;  Laterality: Right;   FOOT SURGERY Left 07/19/2017   IMPLANTATION OF HYPOGLOSSAL NERVE STIMULATOR Right 07/30/2020   Procedure: IMPLANTATION OF HYPOGLOSSAL NERVE STIMULATOR Implantation of Chest wall respirator Sensor Electrode Electronic Analysis of Implanted Neurostimulator Pulse Generator;  Surgeon: Christia Reading, MD;  Location: [facility];  Service: ENT;  Laterality: Right;   INGUINAL HERNIA REPAIR Bilateral 08/06/2022   Procedure: LAPAROSCOPIC BILATERAL INGUINAL HERNIA REPAIR WITH MESH AND PRIMARY CLOSURE OF UMBILICAL HERNIA;  Surgeon: Quentin Ore, MD;  Location: [facility] ORS;  Service: General;  Laterality: Bilateral;   LEFT HEART CATHETERIZATION WITH CORONARY ANGIOGRAM N/A 10/26/2013   Procedure: LEFT HEART CATHETERIZATION WITH CORONARY ANGIOGRAM;  Surgeon: Peter M Swaziland, MD;  Location: [facility] CATH LAB;  Service: Cardiovascular;  Laterality: N/A;   MASS EXCISION Right 08/10/2019   Procedure: EXCISION OF CYST FROM RIGHT SCALP;  Surgeon: Griselda Miner, MD;  Location: [facility];  Service: General;  Laterality: Right;    Social History   Socioeconomic History   Marital status: Married    Spouse name: Not on  file   Number of children: Not on file   Years of education: Not on file   Highest education level: 12th grade  Occupational History   Not on file  Tobacco Use   Smoking status: Former    Current packs/day: 0.00    Average packs/day: 2.0 packs/day for 30.0 years (60.0 ttl pk-yrs)    Types: Cigarettes    Start date: 01/04/1962    Quit date: 01/05/1992    Years since quitting: 31.5    Smokeless tobacco: Never   Tobacco comments:    Former smoker quit 19 yrs ago 11/22/22  Vaping Use   Vaping status: Never Used  Substance and Sexual Activity   Alcohol use: No   Drug use: Yes    Types: Marijuana   Sexual activity: Not on file  Other Topics Concern   Not on file  Social History Narrative   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   Social Determinants of Health   Financial Resource Strain: Low Risk  (05/27/2023)   Overall Financial Resource Strain (CARDIA)    Difficulty of Paying Living Expenses: Not very hard  Food Insecurity: No Food Insecurity (05/27/2023)   Hunger Vital Sign    Worried About Running Out of Food in the Last Year: Never true    Ran Out of Food in the Last Year: Never true  Transportation Needs: No Transportation Needs (05/27/2023)   PRAPARE - Administrator, Civil Service (Medical): No    Lack of Transportation (Non-Medical): No  Physical Activity: Insufficiently Active (05/27/2023)   Exercise Vital Sign    Days of Exercise per Week: 7 days    Minutes of Exercise per Session: 20 min  Stress: No Stress Concern Present (05/27/2023)   [employer] of Occupational Health - Occupational Stress Questionnaire    Feeling of Stress : Not at all  Social Connections: Socially Isolated (05/27/2023)   Social Connection and Isolation Panel [NHANES]    Frequency of Communication with Friends and Family: Once a week    Frequency of Social Gatherings with Friends and Family: Never    Attends Religious Services: Never    Database administrator or Organizations: No    Attends Banker Meetings: Never    Marital Status: Married  Catering manager Violence: Not At Risk (05/02/2023)   Humiliation, Afraid, Rape, and Kick questionnaire    Fear of Current or Ex-Partner: No    Emotionally Abused: No    Physically Abused: No    Sexually Abused: No    Family History  Problem Relation Age of Onset   Heart  attack Mother    Heart disease Mother    Heart attack Father    Heart disease Father    Rectal cancer Neg Hx    Stomach cancer Neg Hx    Pancreatic cancer Neg Hx    Esophageal cancer Neg Hx    Colon polyps Neg Hx    Colon cancer Neg Hx     Current Outpatient Medications  Medication Sig Dispense Refill   acetaminophen (TYLENOL) 500 MG tablet Take 1,000 mg by mouth every 4 (four) hours as needed for moderate pain.     albuterol (VENTOLIN HFA) 108 (90 Base) MCG/ACT inhaler Inhale 2 puffs into the lungs every 6 (six) hours as needed for wheezing or shortness of breath. 54 g 1   aspirin EC 81 MG EC tablet Take 1 tablet (81 mg total) by  mouth daily. (Patient taking differently: Take 81 mg by mouth in the morning.)     Biotin 41324 MCG TABS Take 10,000 mcg by mouth in the morning.     cholecalciferol (VITAMIN D) 25 MCG (1000 UT) tablet Take 1,000 Units by mouth every evening.     cyclobenzaprine (FLEXERIL) 5 MG tablet Take 1 tablet (5 mg total) by mouth 3 (three) times daily as needed for muscle spasms. 30 tablet 1   diclofenac sodium (VOLTAREN) 1 % GEL Apply 2 g topically 4 (four) times daily. (Patient taking differently: Apply 2 g topically 4 (four) times daily as needed (pain).) 100 g 3   divalproex (DEPAKOTE ER) 500 MG 24 hr tablet Take 1 tablet (500 mg total) by mouth daily. 90 tablet 3   dofetilide (TIKOSYN) 250 MCG capsule Take 1 capsule (250 mcg total) by mouth 2 (two) times daily. 60 capsule 3   doxycycline (VIBRAMYCIN) 100 MG capsule Take 100 mg by mouth every evening.     Evolocumab (REPATHA SURECLICK) 140 MG/ML SOAJ ADMINISTER 1 ML UNDER THE SKIN EVERY 14 DAYS 6 mL 3   ezetimibe (ZETIA) 10 MG tablet TAKE 1 TABLET(10 MG) BY MOUTH DAILY (Patient taking differently: Take 10 mg by mouth in the morning.) 90 tablet 3   furosemide (LASIX) 20 MG tablet Take 1 tablet (20 MG) By mouth twice daily. As needed, patient may take 20 MG (1 tablet) additional Lasix PRN by mouth daily x 4 days as  directed per Alleviate Research HF Study PRN plan (Patient taking differently: Take 20 mg by mouth 2 (two) times daily. Take 1 tablet (20 MG) By mouth twice daily. As needed, patient may take 20 MG (1 tablet) additional Lasix PRN by mouth daily x 4 days as directed per Alleviate Research HF Study PRN plan) 180 tablet 3   gabapentin (NEURONTIN) 300 MG capsule TAKE 1 CAPSULE(300 MG) BY MOUTH TWICE DAILY 180 capsule 3   Glucosamine-Chondroitin (GLUCOSAMINE CHONDR COMPLEX PO) Take 500 mg by mouth 2 (two) times daily.      guaiFENesin (MUCINEX) 600 MG 12 hr tablet Take 600 mg by mouth 2 (two) times daily.     hydrALAZINE (APRESOLINE) 25 MG tablet TAKE 1 TABLET BY MOUTH EVERY MORNING AND EVERY NIGHT AT BEDTIME 180 tablet 2   isosorbide mononitrate (IMDUR) 30 MG 24 hr tablet TAKE 1 TABLET BY MOUTH EVERY DAY (Patient taking differently: 30 mg in the morning.) 90 tablet 3   montelukast (SINGULAIR) 10 MG tablet TAKE 1 TABLET(10 MG) BY MOUTH AT BEDTIME 90 tablet 3   Multiple Vitamin (MULTIVITAMIN WITH MINERALS) TABS tablet Take 1 tablet by mouth every evening.     nitroGLYCERIN (NITROSTAT) 0.4 MG SL tablet Place 1 tablet (0.4 mg total) under the tongue every 5 (five) minutes as needed for chest pain. 25 tablet 11   Omega-3 Fatty Acids (FISH OIL) 1200 MG CAPS Take 1,200 mg by mouth 2 (two) times daily.     potassium chloride (KLOR-CON) 10 MEQ tablet As needed patient may take 10 mEq additional Potassium PRN by mouth daily x 4 days as directed per Alleviate Research HF Study PRN plan (Patient taking differently: Take 10 mEq by mouth as needed. As needed patient may take 10 mEq additional Potassium PRN by mouth daily x 4 days as directed per Alleviate Research HF Study PRN plan) 90 tablet 3   potassium chloride SA (KLOR-CON M) 20 MEQ tablet TAKE 1 TABLET BY MOUTH EVERY DAY 90 tablet 1   ramipril (  ALTACE) 10 MG capsule TAKE 2 CAPSULES(20 MG) BY MOUTH DAILY (Patient taking differently: Take 10 mg by mouth every  evening.) 180 capsule 3   rivaroxaban (XARELTO) 20 MG TABS tablet TAKE 1 TABLET(20 MG) BY MOUTH DAILY WITH SUPPER 90 tablet 1   tamsulosin (FLOMAX) 0.4 MG CAPS capsule TAKE 1 CAPSULE(0.4 MG) BY MOUTH DAILY 90 capsule 3   Tetrahydrozoline HCl (VISINE OP) Place 1 drop into both eyes daily as needed (irritation).     vitamin C (ASCORBIC ACID) 500 MG tablet Take 500 mg by mouth daily.     zolpidem (AMBIEN CR) 12.5 MG CR tablet Take 1 tablet (12.5 mg total) by mouth at bedtime. 90 tablet 1   No current facility-administered medications for this visit.    Allergies  Allergen Reactions   Statins     Myalgias with elevated CPKs     REVIEW OF SYSTEMS:   [X]  denotes positive finding, [ ]  denotes negative finding Cardiac  Comments:  Chest pain or chest pressure:    Shortness of breath upon exertion:    Short of breath when lying flat:    Irregular heart rhythm:        Vascular    Pain in calf, thigh, or hip brought on by ambulation:    Pain in feet at night that wakes you up from your sleep:     Blood clot in your veins:    Leg swelling:         Pulmonary    Oxygen at home:    Productive cough:     Wheezing:         Neurologic    Sudden weakness in arms or legs:     Sudden numbness in arms or legs:     Sudden onset of difficulty speaking or slurred speech:    Temporary loss of vision in one eye:     Problems with dizziness:         Gastrointestinal    Blood in stool:     Vomited blood:         Genitourinary    Burning when urinating:     Blood in urine:        Psychiatric    Major depression:         Hematologic    Bleeding problems:    Problems with blood clotting too easily:        Skin    Rashes or ulcers:        Constitutional    Fever or chills:      PHYSICAL EXAMINATION:  Vitals:   07/14/23 0812  BP: (!) 160/82  Pulse: (!) 48  Temp: 98.2 F (36.8 C)  SpO2: 94%  Weight: 190 lb 6.4 oz (86.4 kg)  Height: 5\' 10"  (1.778 m)    General:  WDWN in NAD;  vital signs documented above Gait: Not observed HENT: WNL, normocephalic Pulmonary: normal non-labored breathing , without Rales, rhonchi,  wheezing Cardiac: regular HR Abdomen: soft, NT, no masses Skin: without rashes Vascular Exam/Pulses: DP signals present by Doppler Extremities: without ischemic changes, without Gangrene , without cellulitis; without open wounds;  Musculoskeletal: no muscle wasting or atrophy  Neurologic: A&O X 3 Psychiatric:  The pt has Normal affect.   Non-Invasive Vascular Imaging:   Better visualization of the distal aorta today.  3.6 cm AAA sac seen on duplex with no endoleaks    ASSESSMENT/PLAN:: 72 y.o. male here for follow up for surveillance of EVAR  Visualization  of the distal aorta was better today with duplex.  3.6 cm AAA sac was noted and did not have any endoleaks.  We can continue to follow this on an annual basis.  He will continue his aspirin and statin daily.  He is also noted to have carotid artery stenosis.  He has a follow-up with his cardiologist next month for repeat duplex.  He has a known 60 to 70% stenosis of the left ICA which continues to be asymptomatic.  He will need revascularization if this stenosis increases to above 80% or if it becomes symptomatic.   Emilie Rutter, PA-C [facility] [phone]  Clinic MD:   Karin Lieu

## 2023-08-03 ENCOUNTER — Ambulatory Visit (HOSPITAL_COMMUNITY)
Admission: RE | Admit: 2023-08-03 | Discharge: 2023-08-03 | Disposition: A | Discharge status: Home / Self Care | Payer: PPO | Source: Ambulatory Visit | Attending: Cardiovascular Disease | Admitting: Cardiovascular Disease

## 2023-08-03 ENCOUNTER — Telehealth: Payer: Self-pay

## 2023-08-03 DIAGNOSIS — I6522 Occlusion and stenosis of left carotid artery: Secondary | ICD-10-CM | POA: Diagnosis not present

## 2023-08-03 NOTE — Telephone Encounter (Signed)
The patient has been notified of the result and verbalized understanding.  All questions (if any) were answered. Ethelda Chick, RN 08/03/2023 3:44 PM   Will place order for carotid.

## 2023-08-03 NOTE — Telephone Encounter (Signed)
-----   Message from Charlton Haws sent at 08/03/2023  1:01 PM EDT ----- 40-59% LICA stenosis.  F/U duplex in one year

## 2023-08-09 ENCOUNTER — Other Ambulatory Visit: Payer: Self-pay

## 2023-08-09 DIAGNOSIS — Z9889 Other specified postprocedural states: Secondary | ICD-10-CM

## 2023-08-10 ENCOUNTER — Other Ambulatory Visit (HOSPITAL_COMMUNITY): Payer: Self-pay | Admitting: Internal Medicine

## 2023-08-10 ENCOUNTER — Ambulatory Visit (HOSPITAL_COMMUNITY): Payer: PPO | Admitting: Internal Medicine

## 2023-08-12 ENCOUNTER — Ambulatory Visit (HOSPITAL_COMMUNITY)
Admission: RE | Admit: 2023-08-12 | Discharge: 2023-08-12 | Disposition: A | Discharge status: Home / Self Care | Payer: PPO | Source: Ambulatory Visit | Attending: Internal Medicine | Admitting: Internal Medicine

## 2023-08-12 VITALS — BP 148/72 | HR 50 | Ht 70.0 in | Wt 193.6 lb

## 2023-08-12 VITALS — BP 136/74 | HR 50 | Temp 97.2°F | Resp 18 | Wt 193.0 lb

## 2023-08-12 DIAGNOSIS — Z7901 Long term (current) use of anticoagulants: Secondary | ICD-10-CM | POA: Diagnosis not present

## 2023-08-12 DIAGNOSIS — I10 Essential (primary) hypertension: Secondary | ICD-10-CM | POA: Insufficient documentation

## 2023-08-12 DIAGNOSIS — E785 Hyperlipidemia, unspecified: Secondary | ICD-10-CM | POA: Insufficient documentation

## 2023-08-12 DIAGNOSIS — I440 Atrioventricular block, first degree: Secondary | ICD-10-CM | POA: Diagnosis not present

## 2023-08-12 DIAGNOSIS — D6869 Other thrombophilia: Secondary | ICD-10-CM | POA: Diagnosis not present

## 2023-08-12 DIAGNOSIS — G4733 Obstructive sleep apnea (adult) (pediatric): Secondary | ICD-10-CM | POA: Insufficient documentation

## 2023-08-12 DIAGNOSIS — Z79899 Other long term (current) drug therapy: Secondary | ICD-10-CM

## 2023-08-12 DIAGNOSIS — I4819 Other persistent atrial fibrillation: Secondary | ICD-10-CM | POA: Diagnosis not present

## 2023-08-12 DIAGNOSIS — Z951 Presence of aortocoronary bypass graft: Secondary | ICD-10-CM | POA: Diagnosis not present

## 2023-08-12 DIAGNOSIS — Z5181 Encounter for therapeutic drug level monitoring: Secondary | ICD-10-CM

## 2023-08-12 DIAGNOSIS — R0602 Shortness of breath: Secondary | ICD-10-CM | POA: Diagnosis not present

## 2023-08-12 DIAGNOSIS — I2510 Atherosclerotic heart disease of native coronary artery without angina pectoris: Secondary | ICD-10-CM | POA: Diagnosis not present

## 2023-08-12 DIAGNOSIS — Z006 Encounter for examination for normal comparison and control in clinical research program: Secondary | ICD-10-CM

## 2023-08-12 LAB — BASIC METABOLIC PANEL
Anion gap: 12 (ref 5–15)
BUN: 20 mg/dL (ref 8–23)
CO2: 23 mmol/L (ref 22–32)
Calcium: 9.3 mg/dL (ref 8.9–10.3)
Chloride: 101 mmol/L (ref 98–111)
Creatinine, Ser: 1.19 mg/dL (ref 0.61–1.24)
GFR, Estimated: >60 mL/min (ref >60)
Glucose, Bld: 118 mg/dL — ABNORMAL HIGH (ref 70–99)
Potassium: 4.5 mmol/L (ref 3.5–5.1)
Sodium: 136 mmol/L (ref 135–145)

## 2023-08-12 LAB — MAGNESIUM: Magnesium: 2.3 mg/dL (ref 1.7–2.4)

## 2023-08-12 NOTE — Addendum Note (Signed)
Encounter addended by: Eustace Pen, PA-C on: 08/12/2023 11:29 AM  Actions taken: Clinical Note Signed

## 2023-08-12 NOTE — Research (Signed)
Alleviate HF Research Study  6 Month F/U  No adverse events or cardiovascular medication changes to report at this time.  NYHA EQ 5D 5L completed Labs completed 6 MHWT done   Current Outpatient Medications:    acetaminophen (TYLENOL) 500 MG tablet, Take 1,000 mg by mouth every 4 (four) hours as needed for moderate pain., Disp: , Rfl:    albuterol (VENTOLIN HFA) 108 (90 Base) MCG/ACT inhaler, Inhale 2 puffs into the lungs every 6 (six) hours as needed for wheezing or shortness of breath., Disp: 54 g, Rfl: 1   aspirin EC 81 MG EC tablet, Take 1 tablet (81 mg total) by mouth daily. (Patient taking differently: Take 81 mg by mouth in the morning.), Disp: , Rfl:    Biotin 36644 MCG TABS, Take 10,000 mcg by mouth in the morning., Disp: , Rfl:    cholecalciferol (VITAMIN D) 25 MCG (1000 UT) tablet, Take 1,000 Units by mouth every evening., Disp: , Rfl:    cyclobenzaprine (FLEXERIL) 5 MG tablet, Take 1 tablet (5 mg total) by mouth 3 (three) times daily as needed for muscle spasms., Disp: 30 tablet, Rfl: 1   diclofenac sodium (VOLTAREN) 1 % GEL, Apply 2 g topically 4 (four) times daily. (Patient taking differently: Apply 2 g topically 4 (four) times daily as needed (pain).), Disp: 100 g, Rfl: 3   divalproex (DEPAKOTE ER) 500 MG 24 hr tablet, Take 1 tablet (500 mg total) by mouth daily., Disp: 90 tablet, Rfl: 3   dofetilide (TIKOSYN) 250 MCG capsule, TAKE 1 CAPSULE(250 MCG) BY MOUTH TWICE DAILY, Disp: 60 capsule, Rfl: 3   doxycycline (VIBRAMYCIN) 100 MG capsule, Take 100 mg by mouth every evening., Disp: , Rfl:    Evolocumab (REPATHA SURECLICK) 140 MG/ML SOAJ, ADMINISTER 1 ML UNDER THE SKIN EVERY 14 DAYS, Disp: 6 mL, Rfl: 3   ezetimibe (ZETIA) 10 MG tablet, TAKE 1 TABLET(10 MG) BY MOUTH DAILY (Patient taking differently: Take 10 mg by mouth in the morning.), Disp: 90 tablet, Rfl: 3   furosemide (LASIX) 20 MG tablet, Take 1 tablet (20 MG) By mouth twice daily. As needed, patient may take 20 MG (1  tablet) additional Lasix PRN by mouth daily x 4 days as directed per Allev[employer] Research HF Study PRN plan (Patient taking differently: Take 20 mg by mouth 2 (two) times daily. Take 1 tablet (20 MG) By mouth twice daily. As needed, patient may take 20 MG (1 tablet) additional Lasix PRN by mouth daily x 4 days as directed per [employer] Research HF Study PRN plan), Disp: 180 tablet, Rfl: 3   gabapentin (NEURONTIN) 300 MG capsule, TAKE 1 CAPSULE(300 MG) BY MOUTH TWICE DAILY, Disp: 180 capsule, Rfl: 3   Glucosamine-Chondroitin (GLUCOSAMINE CHONDR COMPLEX PO), Take 500 mg by mouth 2 (two) times daily. , Disp: , Rfl:    guaiFENesin (MUCINEX) 600 MG 12 hr tablet, Take 600 mg by mouth 2 (two) times daily., Disp: , Rfl:    hydrALAZINE (APRESOLINE) 25 MG tablet, TAKE 1 TABLET BY MOUTH EVERY MORNING AND EVERY NIGHT AT BEDTIME, Disp: 180 tablet, Rfl: 2   isosorbide mononitrate (IMDUR) 30 MG 24 hr tablet, TAKE 1 TABLET BY MOUTH EVERY DAY (Patient taking differently: 30 mg in the morning.), Disp: 90 tablet, Rfl: 3   montelukast (SINGULAIR) 10 MG tablet, TAKE 1 TABLET(10 MG) BY MOUTH AT BEDTIME, Disp: 90 tablet, Rfl: 3   Multiple Vitamin (MULTIVITAMIN WITH MINERALS) TABS tablet, Take 1 tablet by mouth every evening., Disp: , Rfl:  nitroGLYCERIN (NITROSTAT) 0.4 MG SL tablet, Place 1 tablet (0.4 mg total) under the tongue every 5 (five) minutes as needed for chest pain., Disp: 25 tablet, Rfl: 11   Omega-3 Fatty Acids (FISH OIL) 1200 MG CAPS, Take 1,200 mg by mouth 2 (two) times daily., Disp: , Rfl:    potassium chloride (KLOR-CON) 10 MEQ tablet, As needed patient may take 10 mEq additional Potassium PRN by mouth daily x 4 days as directed per Alleviate Research HF Study PRN plan (Patient taking differently: Take 10 mEq by mouth as needed. As needed patient may take 10 mEq additional Potassium PRN by mouth daily x 4 days as directed per [employer]leviate Research HF Study PRN plan), Disp: 90 tablet, Rfl: 3   potassium chloride SA  (KLOR-CON M) 20 MEQ tablet, TAKE 1 TABLET BY MOUTH EVERY DAY, Disp: 90 tablet, Rfl: 1   ramipril (ALTACE) 10 MG capsule, TAKE 2 CAPSULES(20 MG) BY MOUTH DAILY (Patient taking differently: Take 10 mg by mouth every evening.), Disp: 180 capsule, Rfl: 3   rivaroxaban (XARELTO) 20 MG TABS tablet, TAKE 1 TABLET(20 MG) BY MOUTH DAILY WITH SUPPER, Disp: 90 tablet, Rfl: 1   tamsulosin (FLOMAX) 0.4 MG CAPS capsule, TAKE 1 CAPSULE(0.4 MG) BY MOUTH DAILY, Disp: 90 capsule, Rfl: 3   Tetrahydrozoline HCl (VISINE OP), Place 1 drop into both eyes daily as needed (irritation)., Disp: , Rfl:    vitamin C (ASCORBIC ACID) 500 MG tablet, Take 500 mg by mouth daily., Disp: , Rfl:    zolpidem (AMBIEN CR) 12.5 MG CR tablet, Take 1 tablet (12.5 mg total) by mouth at bedtime., Disp: 90 tablet, Rfl: 1

## 2023-08-12 NOTE — Progress Notes (Addendum)
Primary Care Physician: Myrlene Broker, MD Primary Cardiologist: Dr Eden Emms Primary Electrophysiologist: Dr [name] Referring Physician: Dr Emily Filbert is a 72 y.o. male with a history of coronary artery disease status post three-vessel CABG, hypertension, OSA, hyperlipidemia, and atrial fibrillation.  He was found to be in atrial fibrillation incidentally on office visit 08/30/2017.  He was put on Eliquis at the time.  Beta-blockers were not started due to resting bradycardia.  January 2020 he was noted increasing dyspnea on exertion.  He had a cardioversion 11/28/18 and was started on amiodarone. He does report today that he was diagnosed with OSA a few years ago but could not tolerate CPAP therapy. He is on Eliquis for a CHADS2VASC score of 3. Patient is s/p afib ablation 03/19/20. He had recurrent persistent afib and is s/p DCCV on 01/13/21. He initially did well but went back into persistent afib and underwent repeat ablation with Dr [name] on 10/25/22.  On follow up today, patient has been in persistent afib on his Kardia mobile device. Also in afib when he saw Dr Eden Emms on 12/08/22. He is scheduled for DCCV on 01/04/23. No bleeding issues on anticoagulation.   On follow up 03/22/23, he is here today for Tikosyn admission. No recent benadryl use. No missed doses of Xarelto. No new medications since pharmacist review.   On follow up 04/01/23 he is s/p Tikosyn admission 6/11-14/24. He did not require cardioversion. Dose reduced due to Qtc lengthening. Monitored until discharge on telemetry which demonstrates Afib > SB. Currently on Tikosyn 250 mcg BID. He notes via Kardiamobile device several possible episodes of Afib since he has been home. No missed doses of Tikosyn or Xarelto.  On follow up 05/10/23, he is here for 1 month Tikosyn surveillance. He has had no episodes of Afib since last office visit. He feels noticeably better and has more energy. He is on Tikosyn 250 mcg BID. No  new medications. No missed doses of Tikosyn or Xarelto.   On follow up 08/12/23, he is here for 3 month Tikosyn surveillance. He has not had any episodes of Afib since last office visit. He feels well and stays active by walking. He has missed 1 dose of Tikosyn since last visit.   Today, he denies symptoms of palpitations, chest pain, orthopnea, PND, lower extremity edema, dizziness, presyncope, syncope, bleeding, or neurologic sequela. The patient is tolerating medications without difficulties and is otherwise without complaint today.    Atrial Fibrillation Risk Factors:  he does have symptoms or diagnosis of sleep apnea. he has an Doctor, hospital.  he does not have a history of rheumatic fever. he does not have a history of alcohol use. The patient does not have a history of early familial atrial fibrillation or other arrhythmias.  he has a BMI of Body mass index is 27.78 kg/m.Marland Kitchen Filed Weights   08/12/23 0854  Weight: 87.8 kg     Family History  Problem Relation Age of Onset   Heart attack Mother    Heart disease Mother    Heart attack Father    Heart disease Father    Rectal cancer Neg Hx    Stomach cancer Neg Hx    Pancreatic cancer Neg Hx    Esophageal cancer Neg Hx    Colon polyps Neg Hx    Colon cancer Neg Hx     Atrial Fibrillation Management history:  Previous antiarrhythmic drugs: amiodarone, tikosyn Previous cardioversions: 11/2018, 01/13/21 Previous ablations: 03/19/20, 10/25/22  CHADS2VASC score: 3 Anticoagulation history: Eliquis   Past Medical History:  Diagnosis Date   Aortic aneurysm [facility]) Endograf 2009   Arthritis    Atrial fibrillation (HCC)    Bradycardia    CAD (coronary artery disease) CABG 03/2000   Median sternotomy for coronary artery bypass grafting x 3 (left   HTN (hypertension)    Hx of CABG 2001   severe 3 vessel disease   Hx of colonic polyp 06/2010   Hyperplastic    Hyperlipidemia    Myocardial infarction [facility])    Sleep apnea    Past  Surgical History:  Procedure Laterality Date   ATRIAL FIBRILLATION ABLATION N/A 03/19/2020   Procedure: ATRIAL FIBRILLATION ABLATION;  Surgeon: Regan Lemming, MD;  Location: [facility] INVASIVE CV LAB;  Service: Cardiovascular;  Laterality: N/A;   ATRIAL FIBRILLATION ABLATION N/A 10/25/2022   Procedure: ATRIAL FIBRILLATION ABLATION;  Surgeon: Regan Lemming, MD;  Location: [facility] INVASIVE CV LAB;  Service: Cardiovascular;  Laterality: N/A;   CARDIOVERSION N/A 11/28/2018   Procedure: CARDIOVERSION;  Surgeon: Parke Poisson, MD;  Location: [facility] ENDOSCOPY;  Service: Cardiovascular;  Laterality: N/A;   CARDIOVERSION N/A 01/13/2021   Procedure: CARDIOVERSION;  Surgeon: Chrystie Nose, MD;  Location: [facility] ENDOSCOPY;  Service: Cardiovascular;  Laterality: N/A;   CARDIOVERSION N/A 01/04/2023   Procedure: CARDIOVERSION;  Surgeon: Meriam Sprague, MD;  Location: [facility] ENDOSCOPY;  Service: Cardiovascular;  Laterality: N/A;   COLONOSCOPY     CORONARY ARTERY BYPASS GRAFT     AAA stent graft on 12/01/2007 at [facility]  coronary bypass in 2001. Dr. Cornelius Moras.   DRUG INDUCED ENDOSCOPY Right 02/18/2020   Procedure: DRUG INDUCED ENDOSCOPY;  Surgeon: Christia Reading, MD;  Location: [facility];  Service: ENT;  Laterality: Right;   FOOT SURGERY Left 07/19/2017   IMPLANTATION OF HYPOGLOSSAL NERVE STIMULATOR Right 07/30/2020   Procedure: IMPLANTATION OF HYPOGLOSSAL NERVE STIMULATOR Implantation of Chest wall respirator Sensor Electrode Electronic Analysis of Implanted Neurostimulator Pulse Generator;  Surgeon: Christia Reading, MD;  Location: [facility];  Service: ENT;  Laterality: Right;   INGUINAL HERNIA REPAIR Bilateral 08/06/2022   Procedure: LAPAROSCOPIC BILATERAL INGUINAL HERNIA REPAIR WITH MESH AND PRIMARY CLOSURE OF UMBILICAL HERNIA;  Surgeon: Quentin Ore, MD;  Location: [facility] ORS;  Service: General;  Laterality: Bilateral;   LEFT HEART CATHETERIZATION WITH CORONARY  ANGIOGRAM N/A 10/26/2013   Procedure: LEFT HEART CATHETERIZATION WITH CORONARY ANGIOGRAM;  Surgeon: Peter M Swaziland, MD;  Location: [facility] CATH LAB;  Service: Cardiovascular;  Laterality: N/A;   MASS EXCISION Right 08/10/2019   Procedure: EXCISION OF CYST FROM RIGHT SCALP;  Surgeon: Chevis Pretty III, MD;  Location:  SURGERY CENTER;  Service: General;  Laterality: Right;    Current Outpatient Medications  Medication Sig Dispense Refill   acetaminophen (TYLENOL) 500 MG tablet Take 1,000 mg by mouth every 4 (four) hours as needed for moderate pain.     albuterol (VENTOLIN HFA) 108 (90 Base) MCG/ACT inhaler Inhale 2 puffs into the lungs every 6 (six) hours as needed for wheezing or shortness of breath. 54 g 1   aspirin EC 81 MG EC tablet Take 1 tablet (81 mg total) by mouth daily. (Patient taking differently: Take 81 mg by mouth in the morning.)     Biotin 09811 MCG TABS Take 10,000 mcg by mouth in the morning.     cholecalciferol (VITAMIN D) 25 MCG (1000 UT) tablet Take 1,000 Units by mouth every evening.  cyclobenzaprine (FLEXERIL) 5 MG tablet Take 1 tablet (5 mg total) by mouth 3 (three) times daily as needed for muscle spasms. 30 tablet 1   diclofenac sodium (VOLTAREN) 1 % GEL Apply 2 g topically 4 (four) times daily. (Patient taking differently: Apply 2 g topically 4 (four) times daily as needed (pain).) 100 g 3   divalproex (DEPAKOTE ER) 500 MG 24 hr tablet Take 1 tablet (500 mg total) by mouth daily. 90 tablet 3   dofetilide (TIKOSYN) 250 MCG capsule TAKE 1 CAPSULE(250 MCG) BY MOUTH TWICE DAILY 60 capsule 3   doxycycline (VIBRAMYCIN) 100 MG capsule Take 100 mg by mouth every evening.     Evolocumab (REPATHA SURECLICK) 140 MG/ML SOAJ ADMINISTER 1 ML UNDER THE SKIN EVERY 14 DAYS 6 mL 3   ezetimibe (ZETIA) 10 MG tablet TAKE 1 TABLET(10 MG) BY MOUTH DAILY (Patient taking differently: Take 10 mg by mouth in the morning.) 90 tablet 3   furosemide (LASIX) 20 MG tablet Take 1 tablet (20 MG) By  mouth twice daily. As needed, patient may take 20 MG (1 tablet) additional Lasix PRN by mouth daily x 4 days as directed per [employer] HF Study PRN plan (Patient taking differently: Take 20 mg by mouth 2 (two) times daily. Take 1 tablet (20 MG) By mouth twice daily. As needed, patient may take 20 MG (1 tablet) additional Lasix PRN by mouth daily x 4 days as directed per [employer] HF Study PRN plan) 180 tablet 3   gabapentin (NEURONTIN) 300 MG capsule TAKE 1 CAPSULE(300 MG) BY MOUTH TWICE DAILY 180 capsule 3   Glucosamine-Chondroitin (GLUCOSAMINE CHONDR COMPLEX PO) Take 500 mg by mouth 2 (two) times daily.      guaiFENesin (MUCINEX) 600 MG 12 hr tablet Take 600 mg by mouth 2 (two) times daily.     hydrALAZINE (APRESOLINE) 25 MG tablet TAKE 1 TABLET BY MOUTH EVERY MORNING AND EVERY NIGHT AT BEDTIME 180 tablet 2   isosorbide mononitrate (IMDUR) 30 MG 24 hr tablet TAKE 1 TABLET BY MOUTH EVERY DAY (Patient taking differently: 30 mg in the morning.) 90 tablet 3   montelukast (SINGULAIR) 10 MG tablet TAKE 1 TABLET(10 MG) BY MOUTH AT BEDTIME 90 tablet 3   Multiple Vitamin (MULTIVITAMIN WITH MINERALS) TABS tablet Take 1 tablet by mouth every evening.     nitroGLYCERIN (NITROSTAT) 0.4 MG SL tablet Place 1 tablet (0.4 mg total) under the tongue every 5 (five) minutes as needed for chest pain. 25 tablet 11   Omega-3 Fatty Acids (FISH OIL) 1200 MG CAPS Take 1,200 mg by mouth 2 (two) times daily.     potassium chloride (KLOR-CON) 10 MEQ tablet As needed patient may take 10 mEq additional Potassium PRN by mouth daily x 4 days as directed per [employer] HF Study PRN plan (Patient taking differently: Take 10 mEq by mouth as needed. As needed patient may take 10 mEq additional Potassium PRN by mouth daily x 4 days as directed per [employer] HF Study PRN plan) 90 tablet 3   potassium chloride SA (KLOR-CON M) 20 MEQ tablet TAKE 1 TABLET BY MOUTH EVERY DAY 90 tablet 1   ramipril (ALTACE) 10  MG capsule TAKE 2 CAPSULES(20 MG) BY MOUTH DAILY (Patient taking differently: Take 10 mg by mouth every evening.) 180 capsule 3   rivaroxaban (XARELTO) 20 MG TABS tablet TAKE 1 TABLET(20 MG) BY MOUTH DAILY WITH SUPPER 90 tablet 1   tamsulosin (FLOMAX) 0.4 MG CAPS capsule TAKE 1 CAPSULE(0.4  MG) BY MOUTH DAILY 90 capsule 3   Tetrahydrozoline HCl (VISINE OP) Place 1 drop into both eyes daily as needed (irritation).     vitamin C (ASCORBIC ACID) 500 MG tablet Take 500 mg by mouth daily.     zolpidem (AMBIEN CR) 12.5 MG CR tablet Take 1 tablet (12.5 mg total) by mouth at bedtime. 90 tablet 1   No current facility-administered medications for this encounter.    Allergies  Allergen Reactions   Statins     Myalgias with elevated CPKs    ROS- All systems are reviewed and negative except as per the HPI above.  Physical Exam: Vitals:   08/12/23 0854  BP: (!) 148/72  Pulse: (!) 50  Weight: 87.8 kg  Height: 5\' 10"  (1.778 m)    GEN- The patient is well appearing, alert and oriented x 3 today.   Neck - no JVD or carotid bruit noted Lungs- Clear to ausculation bilaterally, normal work of breathing Heart- Regular rate and rhythm, no murmurs, rubs or gallops, PMI not laterally displaced Extremities- no clubbing, cyanosis, or edema Skin - no rash or ecchymosis noted   Wt Readings from Last 3 Encounters:  08/12/23 87.5 kg  08/12/23 87.8 kg  07/14/23 86.4 kg    ECG today demonstrates  Vent. rate 50 BPM PR interval 224 ms QRS duration 120 ms QT/QTcB 476/433 ms P-R-T axes 108 -18 18 Sinus bradycardia with sinus arrhythmia with 1st degree A-V block Non-specific intra-ventricular conduction delay Borderline ECG When compared with ECG of 10-May-2023 09:47, PREVIOUS ECG IS PRESENT  Echo 01/21/20 demonstrated   1. Left ventricular ejection fraction, by estimation, is 55 to 60%. The  left ventricle has normal function. Left ventricular endocardial border  not optimally defined to evaluate  regional wall motion. There is mild left  ventricular hypertrophy. Left  ventricular diastolic parameters are consistent with Grade II diastolic  dysfunction (pseudonormalization).   2. Right ventricular systolic function is normal. The right ventricular  size is mildly enlarged. There is mildly elevated pulmonary artery  systolic pressure.   3. Left atrial size was moderately dilated.   4. Right atrial size was mildly dilated.   5. The mitral valve is normal in structure. Mild mitral valve  regurgitation.   6. The aortic valve is tricuspid. Aortic valve regurgitation is mild.  Mild aortic valve sclerosis is present, with no evidence of aortic valve  stenosis.   7. The inferior vena cava is normal in size with greater than 50%  respiratory variability, suggesting right atrial pressure of 3 mmHg.   Epic records are reviewed at length today  CHA2DS2-VASc Score = 3  The patient's score is based upon: CHF History: 0 HTN History: 1 Diabetes History: 0 Stroke History: 0 Vascular Disease History: 1 Age Score: 1 Gender Score: 0      ASSESSMENT AND PLAN: 1. Persistent Atrial Fibrillation (ICD10:  I48.19) The patient's CHA2DS2-VASc score is 3, indicating a 3.2% annual risk of stroke.   S/p afib ablation 03/19/20 with repeat ablation 10/25/22. S/p Tikosyn admission 6/11-14/24.  Qtc stable. Bmet and mag drawn today. Continue Tikosyn 250 mcg BID. F/u 3 months for Tikosyn surveillance.  When he is in atrial fibrillation, he complains of shortness of breath, at the level of NYHA class II.   2. Secondary Hypercoagulable State (ICD10:  D68.69) The patient is at significant risk for stroke/thromboembolism based upon his CHA2DS2-VASc Score of 3.  Continue Xarelto No missed doses  3. HTN Slightly elevated today, advised  to please trend at home.  4. Obstructive sleep apnea Inspire device placed 07/30/20 Followed by Dr Jenne Pane and Dr Mayford Knife   5. CAD S/p CABG. No anginal symptoms.     F/u 3 months for Tikosyn surveillance.     Lake Bells, PA-C [facility] [address], [place] [address] [phone] 08/12/2023 10:08 AM

## 2023-08-14 LAB — CBC
Hematocrit: 40.1 % (ref 37.5–51.0)
Hemoglobin: 12.8 g/dL — ABNORMAL LOW (ref 13.0–17.7)
MCH: 32.4 pg (ref 26.6–33.0)
MCHC: 31.9 g/dL (ref 31.5–35.7)
MCV: 102 fL — ABNORMAL HIGH (ref 79–97)
Platelets: 217 10*3/uL (ref 150–450)
RBC: 3.95 x10E6/uL — ABNORMAL LOW (ref 4.14–5.80)
RDW: 13.2 % (ref 11.6–15.4)
WBC: 5.3 10*3/uL (ref 3.4–10.8)

## 2023-08-14 LAB — PRO B NATRIURETIC PEPTIDE: NT-Pro BNP: 444 pg/mL — ABNORMAL HIGH (ref 0–376)

## 2023-08-15 NOTE — Progress Notes (Signed)
Alleviate labs for review.

## 2023-08-17 ENCOUNTER — Other Ambulatory Visit: Payer: Self-pay | Admitting: Cardiovascular Disease

## 2023-08-17 DIAGNOSIS — I25729 Atherosclerosis of autologous artery coronary artery bypass graft(s) with unspecified angina pectoris: Secondary | ICD-10-CM

## 2023-08-17 DIAGNOSIS — E785 Hyperlipidemia, unspecified: Secondary | ICD-10-CM

## 2023-08-26 ENCOUNTER — Encounter: Payer: Self-pay | Admitting: Podiatry

## 2023-08-26 ENCOUNTER — Ambulatory Visit: Payer: PPO | Admitting: Podiatry

## 2023-08-26 DIAGNOSIS — B351 Tinea unguium: Secondary | ICD-10-CM

## 2023-08-26 DIAGNOSIS — L600 Ingrowing nail: Secondary | ICD-10-CM

## 2023-08-26 NOTE — Patient Instructions (Signed)

## 2023-08-28 NOTE — Progress Notes (Signed)
Subjective:   Patient ID: [name] Brown, male   DOB: 72 y.o.   MRN: [id number]   HPI Patient presents with a severely thickened left hallux toenail which is discolored and becoming more painful with history of surgery that I done in the past.  Patient states that the nail is making it difficult to wear shoe gear and he does not currently smoke and likes to be active   Review of Systems  All other systems reviewed and are negative.       Objective:  Physical Exam Vitals and nursing note reviewed.  Constitutional:      Appearance: He is well-developed.  Pulmonary:     Effort: Pulmonary effort is normal.  Musculoskeletal:        General: Normal range of motion.  Skin:    General: Skin is warm.  Neurological:     Mental Status: He is alert.     Neurovascular status found to be intact muscle strength found to be adequate range of motion adequate with patient found to have a severely thickened left hallux nail with dystrophic changes moderate looseness and history of surgery with the left foot which appears to be stable     Assessment:  Damage nailbed left hallux painful when pressed chronic in nature with overall stable foot structure      Plan:  H&P reviewed condition and at this point recommended the nail be removed permanently.  I did explain procedure risk and allowed him to read then signed consent form and I infiltrated the left big toe 60 mg like Marcaine mixture sterile prep done and using sterile instrumentation remove the left big toenail completely and exposed matrix and applied phenol 5 applications 30 seconds followed by alcohol lavage sterile dressing gave instructions on soaks and to call with any issues

## 2023-09-05 ENCOUNTER — Telehealth: Payer: Self-pay | Admitting: Podiatry

## 2023-09-05 NOTE — Telephone Encounter (Signed)
Patient called stating that he had a toenail removal a week ago by Dr.Regal and had some concerns and questions regarding his nail care, he requested that someone reach out and address his concerns,  Thanks

## 2023-09-15 ENCOUNTER — Telehealth (HOSPITAL_COMMUNITY): Payer: Self-pay | Admitting: *Deleted

## 2023-09-15 MED ORDER — HYDRALAZINE HCL 25 MG PO TABS: 25.0000 mg | ORAL_TABLET | Freq: Three times a day (TID) | ORAL | 2 refills | Status: DC

## 2023-09-15 NOTE — Telephone Encounter (Signed)
Pt called in stating his blood pressures have continued to be elevated at home since last visit. BP ranging from 147-156/70-78. Denies weight gain/ swelling or diet indiscretion. Per Landry Mellow PA will increase hydralazine 25mg  to TID.  If BP continues to be elevated despite increase pt has been instructed to contact primary cardiologist Dr Eden Emms for further management. Pt in agreement.

## 2023-09-19 ENCOUNTER — Telehealth: Payer: Self-pay | Admitting: Cardiovascular Disease

## 2023-09-19 MED ORDER — HYDRALAZINE HCL 50 MG PO TABS: 50.0000 mg | ORAL_TABLET | Freq: Three times a day (TID) | ORAL | 3 refills | Status: DC

## 2023-09-19 NOTE — Telephone Encounter (Signed)
Called patient back about message. Patient stated his SBP has been going up for a while now.   Latest readings-  161/77, 154/72, 184/62, 164/73, 143/71 Yesterday and Today  Last week patient went to A. FIB clinic and they increased his hydralazine to 25 mg TID. Patient also complained about left upper arm pain. Patient stated that 4 weeks ago he did get a dead toe nail removed, and he has had some pain associated with that, but his BP has been high before that. Patient denies any chest pain or SOB. Patient is currently on-  Ramipril 20 mg  daily Hydralazine 25 mg TID Imdur 30 mg daily Lasix 20 mg BID Tikosyn 250 mcg BID  Will send message to Dr. Eden Emms for advisement.

## 2023-09-19 NOTE — Telephone Encounter (Signed)
[name] Stade, MD  to Me     09/19/23  3:02 PM Can increase hydralazine to 50 mg tid   Called patient with the increase in Hydralazine. Patient agreed to plan.

## 2023-09-19 NOTE — Telephone Encounter (Signed)
Pt c/o BP issue: STAT if pt c/o blurred vision, one-sided weakness or slurred speech  1. What are your last 5 BP readings? 164/73; 151/77; 154/72; 184/62  2. Are you having any other symptoms (ex. Dizziness, headache, blurred vision, passed out)? Headache, pain in the upper arm/shoulder   3. What is your BP issue? Running high

## 2023-09-26 ENCOUNTER — Telehealth: Payer: Self-pay | Admitting: Cardiovascular Disease

## 2023-09-26 NOTE — Telephone Encounter (Signed)
Pt is calling to give Dr. Eden Emms and Elita Quick RN some feedback about his pressures, since Dr. Eden Emms increased his hydralazine to 50 mg po TID last week.  Pt states his numbers are coming down, but still hypertensive.    Pt states his blood pressures are running consistently in the 140s/80s.  Pt would like for Dr. Eden Emms to further advise if he should titrate the dose of his hydralazine up some more, and if so how much.  He will continue with ambulatory BP monitoring.  Pt aware that Pam is out of the office this week, but I will route this now to Dr. Eden Emms to further advise on.  Pt aware that a triage nurse will follow-up with him accordingly thereafter, once Dr. Eden Emms advises.   Pt verbalized understanding and agrees with this plan.

## 2023-09-26 NOTE — Telephone Encounter (Signed)
  Pt is requesting to speak with [name] Brown again regarding his hydralazin

## 2023-09-26 NOTE — Telephone Encounter (Signed)
Message Received: Today [name] Stade, MD  Loa Socks, LPN Caller: Unspecified (Today,  8:21 AM) Would continue as is for now   Pt aware that per Dr. Eden Emms, he would like for him to continue his current dose of hydralazine.   Pt verbalized understanding and agrees with this plan.

## 2023-10-10 ENCOUNTER — Ambulatory Visit: Payer: Self-pay | Admitting: Internal Medicine

## 2023-10-10 NOTE — Telephone Encounter (Signed)
   Chief Complaint: groin pain Symptoms: moderate groin pain, intermittent numbness in left leg Frequency: x 6 weeks Pertinent Negatives: Patient denies fever, rash, swelling Disposition: [] ED /[] Urgent Care (no appt availability in office) / [x] Appointment(In office/virtual)/ []  [facility]/ [] Home Care/ [] Refused Recommended Disposition /[] [facility]/ []  Follow-up with PCP Additional Notes: Patient reports he has had groin pain and intermittent left leg numbness x 6 weeks. Patient reports he had an ingrown toe nail removed 6 weeks ago and is unsure if it is related. Patient denies swelling, rash, and fever. Per protocol, appt scheduled 12/31 in office. Patient advised to call back with worsening symptoms. Patient verbalized understanding.    Copied from CRM [id number]. Topic: Clinical - Red Word Triage >> Oct 10, 2023  9:29 AM Fredrich Romans wrote: Red Word that prompted transfer to Nurse Triage: numbness in left leg,pain in groin area Reason for Disposition  Numbness in a leg or foot (i.e., loss of sensation)  Answer Assessment - Initial Assessment Questions 1. ONSET: "When did the pain start?"      6 weeks ago 2. LOCATION: "Where is the pain located?"      Groin pain 3. PAIN: "How bad is the pain?"    (Scale 1-10; or mild, moderate, severe)   -  MILD (1-3): doesn't interfere with normal activities    -  MODERATE (4-7): interferes with normal activities (e.g., work or school) or awakens from sleep, limping    -  SEVERE (8-10): excruciating pain, unable to do any normal activities, unable to walk     5/10 4. WORK OR EXERCISE: "Has there been any recent work or exercise that involved this part of the body?"      none 5. CAUSE: "What do you think is causing the leg pain?"     I had an ingrown toe nail removed 6 weeks ago and I'm not sure if it's related 6. OTHER SYMPTOMS: "Do you have any other symptoms?" (e.g., chest pain, back pain, breathing difficulty, swelling, rash,  fever, numbness, weakness)     Numbness in left leg  Protocols used: Leg Pain-A-AH

## 2023-10-10 NOTE — Progress Notes (Signed)
   Acute Office Visit  Subjective:     Patient ID: [name] Brown, male    DOB: 09-05-1951, 72 y.o.   MRN: [id number]  Chief Complaint  Patient presents with   Groin Pain    Groin pain for the past 3 weeks as well as left leg falling asleep frequently. PT notes when sitting they had separated their legs in their recliner (left leg straight, right leg off the side of the recliner) and felt they had pulled something.    HPI Patient is in today for evaluation of L groin pain for the last 2-3 weeks. Reports hx L abdominal hernia, states this feels very similar. Reports that the L leg feels like it is falling asleep more often than usual.  Denies burning, erythema, swelling, bruising, known injury, skin color change, pain with walking, other concerns today.  ROS Per HPI      Objective:    BP (!) 138/58   Pulse (!) 52   Temp 97.7 F (36.5 C)   Ht 5' 10 (1.778 m)   Wt 194 lb 3.2 oz (88.1 kg)   SpO2 97%   BMI 27.86 kg/m    Physical Exam Vitals and nursing note reviewed.  Constitutional:      General: He is not in acute distress.    Appearance: Normal appearance.  HENT:     Head: Normocephalic and atraumatic.  Eyes:     Extraocular Movements: Extraocular movements intact.  Cardiovascular:     Pulses: Normal pulses.  Pulmonary:     Effort: Pulmonary effort is normal.  Abdominal:     Hernia: A hernia is present. Hernia is present in the left inguinal area.       Comments: Area of hernia and tenderness  Musculoskeletal:        General: Tenderness present. Normal range of motion.     Cervical back: Normal range of motion.     Comments: 2 x 2 cm reducible but tender lump to L groin, no erythema, no heat, no obvious deformity to the area  Skin:    General: Skin is warm and dry.  Neurological:     General: No focal deficit present.     Mental Status: He is alert and oriented to person, place, and time.  Psychiatric:        Mood and Affect: Mood normal.         Behavior: Behavior normal.    No results found for any visits on 10/11/23.      Assessment & Plan:  1. Left groin pain (Primary)  - US  Pelvis Limited; Future - If US  shows hernia, will refer to Dr Lyndel with [facility] per pt preference  2. Lump in the groin  - US  Pelvis Limited; Future  Low concern for blood clot given no heat, no erythema, no pain, mild tenderness   No orders of the defined types were placed in this encounter.   Return if symptoms worsen or fail to improve.  Corean Ku, FNP

## 2023-10-11 ENCOUNTER — Ambulatory Visit (INDEPENDENT_AMBULATORY_CARE_PROVIDER_SITE_OTHER): Payer: PPO | Admitting: Family Medicine

## 2023-10-11 ENCOUNTER — Encounter: Payer: Self-pay | Admitting: Family Medicine

## 2023-10-11 VITALS — BP 138/58 | HR 52 | Temp 97.7°F | Ht 70.0 in | Wt 194.2 lb

## 2023-10-11 DIAGNOSIS — R1909 Other intra-abdominal and pelvic swelling, mass and lump: Secondary | ICD-10-CM | POA: Diagnosis not present

## 2023-10-11 DIAGNOSIS — R1032 Left lower quadrant pain: Secondary | ICD-10-CM

## 2023-10-11 NOTE — Patient Instructions (Signed)
 I have ordered an ultrasound for you today. Someone will be reaching out to get you scheduled.   If this area does show up as a hernia, I will refer you back to [facility] with Dr Kathie Rhodes.   Follow-up with me for new or worsening symptoms.

## 2023-10-13 DIAGNOSIS — Z006 Encounter for examination for normal comparison and control in clinical research program: Secondary | ICD-10-CM

## 2023-10-13 NOTE — Research (Signed)
 Alleviate HF Research Study  8 Month Follow-up  No adverse events to report at this time. Hydralazine  dose recently increased.   Current Outpatient Medications:    acetaminophen  (TYLENOL ) 500 MG tablet, Take 1,000 mg by mouth every 4 (four) hours as needed for moderate pain., Disp: , Rfl:    albuterol  (VENTOLIN  HFA) 108 (90 Base) MCG/ACT inhaler, Inhale 2 puffs into the lungs every 6 (six) hours as needed for wheezing or shortness of breath., Disp: 54 g, Rfl: 1   aspirin  EC 81 MG EC tablet, Take 1 tablet (81 mg total) by mouth daily. (Patient taking differently: Take 81 mg by mouth in the morning.), Disp: , Rfl:    Biotin 89999 MCG TABS, Take 10,000 mcg by mouth in the morning., Disp: , Rfl:    cholecalciferol  (VITAMIN D) 25 MCG (1000 UT) tablet, Take 1,000 Units by mouth every evening., Disp: , Rfl:    cyclobenzaprine  (FLEXERIL ) 5 MG tablet, Take 1 tablet (5 mg total) by mouth 3 (three) times daily as needed for muscle spasms., Disp: 30 tablet, Rfl: 1   diclofenac  sodium (VOLTAREN ) 1 % GEL, Apply 2 g topically 4 (four) times daily. (Patient taking differently: Apply 2 g topically 4 (four) times daily as needed (pain).), Disp: 100 g, Rfl: 3   divalproex  (DEPAKOTE  ER) 500 MG 24 hr tablet, Take 1 tablet (500 mg total) by mouth daily., Disp: 90 tablet, Rfl: 3   dofetilide  (TIKOSYN ) 250 MCG capsule, TAKE 1 CAPSULE(250 MCG) BY MOUTH TWICE DAILY, Disp: 60 capsule, Rfl: 3   doxycycline  (VIBRAMYCIN ) 100 MG capsule, Take 100 mg by mouth every evening., Disp: , Rfl:    ezetimibe  (ZETIA ) 10 MG tablet, TAKE 1 TABLET(10 MG) BY MOUTH DAILY (Patient taking differently: Take 10 mg by mouth in the morning.), Disp: 90 tablet, Rfl: 3   furosemide  (LASIX ) 20 MG tablet, Take 1 tablet (20 MG) By mouth twice daily. As needed, patient may take 20 MG (1 tablet) additional Lasix  PRN by mouth daily x 4 days as directed per Alleviate Research HF Study PRN plan (Patient taking differently: Take 20 mg by mouth 2 (two) times  daily. Take 1 tablet (20 MG) By mouth twice daily. As needed, patient may take 20 MG (1 tablet) additional Lasix  PRN by mouth daily x 4 days as directed per Alleviate Research HF Study PRN plan), Disp: 180 tablet, Rfl: 3   gabapentin  (NEURONTIN ) 300 MG capsule, TAKE 1 CAPSULE(300 MG) BY MOUTH TWICE DAILY, Disp: 180 capsule, Rfl: 3   Glucosamine-Chondroitin (GLUCOSAMINE CHONDR COMPLEX PO), Take 500 mg by mouth 2 (two) times daily. , Disp: , Rfl:    guaiFENesin  (MUCINEX ) 600 MG 12 hr tablet, Take 600 mg by mouth 2 (two) times daily., Disp: , Rfl:    hydrALAZINE  (APRESOLINE ) 50 MG tablet, Take 1 tablet (50 mg total) by mouth 3 (three) times daily., Disp: 270 tablet, Rfl: 3   isosorbide  mononitrate (IMDUR ) 30 MG 24 hr tablet, TAKE 1 TABLET BY MOUTH EVERY DAY (Patient taking differently: 30 mg in the morning.), Disp: 90 tablet, Rfl: 3   montelukast  (SINGULAIR ) 10 MG tablet, TAKE 1 TABLET(10 MG) BY MOUTH AT BEDTIME, Disp: 90 tablet, Rfl: 3   Multiple Vitamin (MULTIVITAMIN WITH MINERALS) TABS tablet, Take 1 tablet by mouth every evening., Disp: , Rfl:    Omega-3 Fatty Acids (FISH OIL) 1200 MG CAPS, Take 1,200 mg by mouth 2 (two) times daily., Disp: , Rfl:    potassium chloride  (KLOR-CON ) 10 MEQ tablet, As needed patient  may take 10 mEq additional Potassium PRN by mouth daily x 4 days as directed per Alleviate Research HF Study PRN plan (Patient taking differently: Take 10 mEq by mouth as needed. As needed patient may take 10 mEq additional Potassium PRN by mouth daily x 4 days as directed per Alleviate Research HF Study PRN plan), Disp: 90 tablet, Rfl: 3   potassium chloride  SA (KLOR-CON  M) 20 MEQ tablet, TAKE 1 TABLET BY MOUTH EVERY DAY, Disp: 90 tablet, Rfl: 1   ramipril  (ALTACE ) 10 MG capsule, TAKE 2 CAPSULES(20 MG) BY MOUTH DAILY (Patient taking differently: Take 10 mg by mouth every evening.), Disp: 180 capsule, Rfl: 3   REPATHA  SURECLICK 140 MG/ML SOAJ, ADMINISTER 1 ML UNDER THE SKIN EVERY 14 DAYS, Disp:  6 mL, Rfl: 3   rivaroxaban  (XARELTO ) 20 MG TABS tablet, TAKE 1 TABLET(20 MG) BY MOUTH DAILY WITH SUPPER, Disp: 90 tablet, Rfl: 1   tamsulosin  (FLOMAX ) 0.4 MG CAPS capsule, TAKE 1 CAPSULE(0.4 MG) BY MOUTH DAILY, Disp: 90 capsule, Rfl: 3   Tetrahydrozoline HCl (VISINE OP), Place 1 drop into both eyes daily as needed (irritation)., Disp: , Rfl:    vitamin C  (ASCORBIC ACID ) 500 MG tablet, Take 500 mg by mouth daily., Disp: , Rfl:    zolpidem  (AMBIEN  CR) 12.5 MG CR tablet, Take 1 tablet (12.5 mg total) by mouth at bedtime., Disp: 90 tablet, Rfl: 1   nitroGLYCERIN  (NITROSTAT ) 0.4 MG SL tablet, Place 1 tablet (0.4 mg total) under the tongue every 5 (five) minutes as needed for chest pain., Disp: 25 tablet, Rfl: 11

## 2023-10-18 ENCOUNTER — Other Ambulatory Visit: Payer: Self-pay | Admitting: Family Medicine

## 2023-10-18 DIAGNOSIS — R1032 Left lower quadrant pain: Secondary | ICD-10-CM

## 2023-10-18 DIAGNOSIS — R1909 Other intra-abdominal and pelvic swelling, mass and lump: Secondary | ICD-10-CM

## 2023-10-19 ENCOUNTER — Telehealth: Payer: Self-pay | Admitting: Cardiovascular Disease

## 2023-10-19 NOTE — Telephone Encounter (Signed)
 Pt c/o medication issue:  1. Name of Medication:   hydrALAZINE  (APRESOLINE ) 50 MG tablet   2. How are you currently taking this medication (dosage and times per day)?   As prescribed  3. Are you having a reaction (difficulty breathing--STAT)?   4. What is your medication issue?   Patient stated his BP is still running high on this medication.  Patient stated the average over the last 2 weeks has been 150/78.

## 2023-10-19 NOTE — Telephone Encounter (Signed)
 Called patient back about his message. Patient stated he feels better but his SBP is still 140's to 150's. Patient stated his HR is around 60's. Patient is taking Ramipirl 10 mg daily, imdur  30 mg daily, hydralazine  50 mg TID, lasix  20 mg BID, and Tikosyn  250 mcg BID. Will forward to Dr. Nishan for advisement. Patient has appointment in March.

## 2023-10-20 NOTE — Telephone Encounter (Signed)
 Left detailed message of Dr. Fabio Bering response. Per Dr. Eden Emms, would not make any changes. Asked patient to call back if he has questions.

## 2023-10-31 ENCOUNTER — Telehealth: Payer: Self-pay | Admitting: Internal Medicine

## 2023-10-31 NOTE — Telephone Encounter (Signed)
Copied from CRM [id number]. Topic: Referral - Request for Referral >> Oct 31, 2023 10:47 AM Florestine Avers wrote: Did the patient discuss referral with their provider in the last year? Yes (If No - schedule appointment) (If Yes - send message)  Appointment offered? No, he already had an appointment 3 week ago with provider.  Type of order/referral and detailed reason for visit: Patient called saying that the provider was supposed to send a referral for an ultrasound of his groin due to the ongoing pain in that area and stomach. Patient states that his insurance said that a referral request was never sent. Patient is requesting a call back.  Preference of office, provider, location: Patient requesting call back.  If referral order, have you been seen by this specialty before? No (If Yes, this issue or another issue? When? Where?  Can we respond through MyChart? No

## 2023-10-31 NOTE — Telephone Encounter (Signed)
Order is in system okay to check with imaging/referrals on PA/scheduling

## 2023-11-02 NOTE — Telephone Encounter (Signed)
Spoke with imaging at [facility], they stated patient needs to call and schedule his [place]. Spoke with patient, provided him with their phone number. Nothing further needed

## 2023-11-04 ENCOUNTER — Ambulatory Visit (HOSPITAL_COMMUNITY)
Admission: RE | Admit: 2023-11-04 | Discharge: 2023-11-04 | Disposition: A | Discharge status: Home / Self Care | Payer: PPO | Source: Ambulatory Visit | Attending: Family Medicine | Admitting: Family Medicine

## 2023-11-04 DIAGNOSIS — R1032 Left lower quadrant pain: Secondary | ICD-10-CM | POA: Diagnosis not present

## 2023-11-04 DIAGNOSIS — Z0389 Encounter for observation for other suspected diseases and conditions ruled out: Secondary | ICD-10-CM | POA: Diagnosis not present

## 2023-11-04 DIAGNOSIS — R1909 Other intra-abdominal and pelvic swelling, mass and lump: Secondary | ICD-10-CM | POA: Diagnosis not present

## 2023-11-08 ENCOUNTER — Encounter: Payer: Self-pay | Admitting: Family Medicine

## 2023-11-10 ENCOUNTER — Telehealth: Payer: Self-pay | Admitting: Pharmacy Technician

## 2023-11-10 ENCOUNTER — Other Ambulatory Visit (HOSPITAL_COMMUNITY): Payer: Self-pay

## 2023-11-10 ENCOUNTER — Telehealth: Payer: Self-pay | Admitting: Cardiovascular Disease

## 2023-11-10 NOTE — Telephone Encounter (Signed)
.  Pt c/o medication issue:  1. Name of Medication: Repatha  2. How are you currently taking this medication (dosage and times per day)?   3. Are you having a reaction (difficulty breathing--STAT)?   4. What is your medication issue?  She wants to know if the prior authorization was received, hat was sent on 11-08-23 for Repatha?

## 2023-11-10 NOTE — Telephone Encounter (Signed)
Pharmacy Patient Advocate Encounter   Received notification from Pt Calls Messages that prior authorization for repatha is required/requested.   Insurance verification completed.   The patient is insured through [facility] ADVANTAGE/RX ADVANCE .    The last labs I see is from 02/04/21. We need labs done within the last 6 months to submit. Please advise, thank you    Per test claim: PA required; PA started via CoverMyMeds. KEY [id number] . Please see clinical question(s) below that I am not finding the answer to in her chart and advise.

## 2023-11-10 NOTE — Telephone Encounter (Signed)
The last labs I see is from 02/04/21. We need labs done within the last 6 months to submit. Please advise, thank you    Per test claim: PA required; PA started via CoverMyMeds. KEY [id number] . Please see clinical question(s) below that I am not finding the answer to in her chart and advise.

## 2023-11-11 ENCOUNTER — Other Ambulatory Visit (HOSPITAL_COMMUNITY): Payer: Self-pay

## 2023-11-11 ENCOUNTER — Telehealth: Payer: Self-pay | Admitting: Cardiovascular Disease

## 2023-11-11 DIAGNOSIS — I6522 Occlusion and stenosis of left carotid artery: Secondary | ICD-10-CM

## 2023-11-11 DIAGNOSIS — Z951 Presence of aortocoronary bypass graft: Secondary | ICD-10-CM

## 2023-11-11 NOTE — Telephone Encounter (Signed)
Pt c/o medication issue:  1. Name of Medication:   REPATHA SURECLICK 140 MG/ML SOAJ   2. How are you currently taking this medication (dosage and times per day)? As prescribed  3. Are you having a reaction (difficulty breathing--STAT)?  No  4. What is your medication issue?    Patient stated he tried to refill his medication but will need prior authorization.  Patient stated he is completely out of this medication and wants refill sent to [id number] - [place], [place] - [address] AT [facility] OF [address].

## 2023-11-14 NOTE — Telephone Encounter (Signed)
Per 11/10/23 Encounter, the patient is needing new lab orders to be placed before the patient can get PA for his Repatha. Please advise.

## 2023-11-14 NOTE — Telephone Encounter (Signed)
Called patient to let him know we need lab work. Placed order for labs and patient will get them done this week.

## 2023-11-16 ENCOUNTER — Ambulatory Visit (HOSPITAL_COMMUNITY)
Admission: RE | Admit: 2023-11-16 | Discharge: 2023-11-16 | Disposition: A | Discharge status: Home / Self Care | Payer: PPO | Source: Ambulatory Visit | Attending: Internal Medicine | Admitting: Internal Medicine

## 2023-11-16 VITALS — BP 150/64 | HR 63 | Ht 70.0 in | Wt 194.0 lb

## 2023-11-16 DIAGNOSIS — Z951 Presence of aortocoronary bypass graft: Secondary | ICD-10-CM | POA: Insufficient documentation

## 2023-11-16 DIAGNOSIS — Z79899 Other long term (current) drug therapy: Secondary | ICD-10-CM | POA: Diagnosis not present

## 2023-11-16 DIAGNOSIS — R0602 Shortness of breath: Secondary | ICD-10-CM | POA: Insufficient documentation

## 2023-11-16 DIAGNOSIS — G4733 Obstructive sleep apnea (adult) (pediatric): Secondary | ICD-10-CM | POA: Diagnosis not present

## 2023-11-16 DIAGNOSIS — Z5181 Encounter for therapeutic drug level monitoring: Secondary | ICD-10-CM | POA: Insufficient documentation

## 2023-11-16 DIAGNOSIS — I10 Essential (primary) hypertension: Secondary | ICD-10-CM | POA: Insufficient documentation

## 2023-11-16 DIAGNOSIS — I2510 Atherosclerotic heart disease of native coronary artery without angina pectoris: Secondary | ICD-10-CM | POA: Diagnosis not present

## 2023-11-16 DIAGNOSIS — I4819 Other persistent atrial fibrillation: Secondary | ICD-10-CM | POA: Diagnosis not present

## 2023-11-16 DIAGNOSIS — Z7901 Long term (current) use of anticoagulants: Secondary | ICD-10-CM | POA: Diagnosis not present

## 2023-11-16 DIAGNOSIS — D6869 Other thrombophilia: Secondary | ICD-10-CM | POA: Diagnosis not present

## 2023-11-16 LAB — BASIC METABOLIC PANEL
Anion gap: 10 (ref 5–15)
BUN: 23 mg/dL (ref 8–23)
CO2: 26 mmol/L (ref 22–32)
Calcium: 9.5 mg/dL (ref 8.9–10.3)
Chloride: 102 mmol/L (ref 98–111)
Creatinine, Ser: 1.23 mg/dL (ref 0.61–1.24)
GFR, Estimated: >60 mL/min (ref >60)
Glucose, Bld: 125 mg/dL — ABNORMAL HIGH (ref 70–99)
Potassium: 4.4 mmol/L (ref 3.5–5.1)
Sodium: 138 mmol/L (ref 135–145)

## 2023-11-16 LAB — MAGNESIUM: Magnesium: 2.2 mg/dL (ref 1.7–2.4)

## 2023-11-16 NOTE — Progress Notes (Signed)
 Primary Care Physician: Rollene Almarie LABOR, MD Primary Cardiologist: Dr Delford Primary Electrophysiologist: Dr Inocencio Referring Physician: Dr Deno Victory [name] Brown is a 73 y.o. male with a history of coronary artery disease status post three-vessel CABG, hypertension, OSA, hyperlipidemia, and atrial fibrillation.  He was found to be in atrial fibrillation incidentally on office visit 08/30/2017.  He was put on Eliquis  at the time.  Beta-blockers were not started due to resting bradycardia.  January 2020 he was noted increasing dyspnea on exertion.  He had a cardioversion 11/28/18 and was started on amiodarone . He does report today that he was diagnosed with OSA a few years ago but could not tolerate CPAP therapy. He is on Eliquis  for a CHADS2VASC score of 3. Patient is s/p afib ablation 03/19/20. He had recurrent persistent afib and is s/p DCCV on 01/13/21. He initially did well but went back into persistent afib and underwent repeat ablation with Dr Inocencio on 10/25/22.  On follow up today, patient has been in persistent afib on his Kardia mobile device. Also in afib when he saw Dr Delford on 12/08/22. He is scheduled for DCCV on 01/04/23. No bleeding issues on anticoagulation.   On follow up 03/22/23, he is here today for Tikosyn  admission. No recent benadryl  use. No missed doses of Xarelto . No new medications since pharmacist review.   On follow up 04/01/23 he is s/p Tikosyn  admission 6/11-14/24. He did not require cardioversion. Dose reduced due to Qtc lengthening. Monitored until discharge on telemetry which demonstrates Afib > SB. Currently on Tikosyn  250 mcg BID. He notes via Kardiamobile device several possible episodes of Afib since he has been home. No missed doses of Tikosyn  or Xarelto .  On follow up 05/10/23, he is here for 1 month Tikosyn  surveillance. He has had no episodes of Afib since last office visit. He feels noticeably better and has more energy. He is on Tikosyn  250 mcg BID. No  new medications. No missed doses of Tikosyn  or Xarelto .   On follow up 08/12/23, he is here for 3 month Tikosyn  surveillance. He has not had any episodes of Afib since last office visit. He feels well and stays active by walking. He has missed 1 dose of Tikosyn  since last visit.   On follow up 11/16/23, he is here for Tikosyn  surveillance. He is currently in NSR. He has had no episodes of Afib since last office visit. No bleeding issues on Xarelto .   Today, he denies symptoms of palpitations, chest pain, orthopnea, PND, lower extremity edema, dizziness, presyncope, syncope, bleeding, or neurologic sequela. The patient is tolerating medications without difficulties and is otherwise without complaint today.    Atrial Fibrillation Risk Factors:  he does have symptoms or diagnosis of sleep apnea. he has an Doctor, hospital.  he does not have a history of rheumatic fever. he does not have a history of alcohol use. The patient does not have a history of early familial atrial fibrillation or other arrhythmias.  he has a BMI of Body mass index is 27.84 kg/m.SABRA Filed Weights   11/16/23 0849  Weight: 88 kg     Family History  Problem Relation Age of Onset   Heart attack Mother    Heart disease Mother    Heart attack Father    Heart disease Father    Rectal cancer Neg Hx    Stomach cancer Neg Hx    Pancreatic cancer Neg Hx    Esophageal cancer Neg Hx  Colon polyps Neg Hx    Colon cancer Neg Hx     Atrial Fibrillation Management history:  Previous antiarrhythmic drugs: amiodarone , tikosyn  Previous cardioversions: 11/2018, 01/13/21 Previous ablations: 03/19/20, 10/25/22 CHADS2VASC score: 3 Anticoagulation history: Eliquis    Past Medical History:  Diagnosis Date   Aortic aneurysm [facility]) Endograf 2009   Arthritis    Atrial fibrillation (HCC)    Bradycardia    CAD (coronary artery disease) CABG 03/2000   Median sternotomy for coronary artery bypass grafting x 3 (left   HTN (hypertension)     Hx of CABG 2001   severe 3 vessel disease   Hx of colonic polyp 06/2010   Hyperplastic    Hyperlipidemia    Myocardial infarction [facility])    Sleep apnea    Past Surgical History:  Procedure Laterality Date   ATRIAL FIBRILLATION ABLATION N/A 03/19/2020   Procedure: ATRIAL FIBRILLATION ABLATION;  Surgeon: Inocencio Soyla Lunger, MD;  Location: [facility] INVASIVE CV LAB;  Service: Cardiovascular;  Laterality: N/A;   ATRIAL FIBRILLATION ABLATION N/A 10/25/2022   Procedure: ATRIAL FIBRILLATION ABLATION;  Surgeon: Inocencio Soyla Lunger, MD;  Location: [facility] INVASIVE CV LAB;  Service: Cardiovascular;  Laterality: N/A;   CARDIOVERSION N/A 11/28/2018   Procedure: CARDIOVERSION;  Surgeon: Loni Soyla LABOR, MD;  Location: [facility] ENDOSCOPY;  Service: Cardiovascular;  Laterality: N/A;   CARDIOVERSION N/A 01/13/2021   Procedure: CARDIOVERSION;  Surgeon: Mona Vinie BROCKS, MD;  Location: [facility] ENDOSCOPY;  Service: Cardiovascular;  Laterality: N/A;   CARDIOVERSION N/A 01/04/2023   Procedure: CARDIOVERSION;  Surgeon: Hobart Powell BRAVO, MD;  Location: [facility] ENDOSCOPY;  Service: Cardiovascular;  Laterality: N/A;   COLONOSCOPY     CORONARY ARTERY BYPASS GRAFT     AAA stent graft on 12/01/2007 at [facility]  coronary bypass in 2001. Dr. Dusty.   DRUG INDUCED ENDOSCOPY Right 02/18/2020   Procedure: DRUG INDUCED ENDOSCOPY;  Surgeon: Carlie Clark, MD;  Location: [facility];  Service: ENT;  Laterality: Right;   FOOT SURGERY Left 07/19/2017   IMPLANTATION OF HYPOGLOSSAL NERVE STIMULATOR Right 07/30/2020   Procedure: IMPLANTATION OF HYPOGLOSSAL NERVE STIMULATOR Implantation of Chest wall respirator Sensor Electrode Electronic Analysis of Implanted Neurostimulator Pulse Generator;  Surgeon: Carlie Clark, MD;  Location: [facility];  Service: ENT;  Laterality: Right;   INGUINAL HERNIA REPAIR Bilateral 08/06/2022   Procedure: LAPAROSCOPIC BILATERAL INGUINAL HERNIA REPAIR WITH MESH AND PRIMARY  CLOSURE OF UMBILICAL HERNIA;  Surgeon: Lyndel Deward PARAS, MD;  Location: [facility] ORS;  Service: General;  Laterality: Bilateral;   LEFT HEART CATHETERIZATION WITH CORONARY ANGIOGRAM N/A 10/26/2013   Procedure: LEFT HEART CATHETERIZATION WITH CORONARY ANGIOGRAM;  Surgeon: Peter M Jordan, MD;  Location: [facility] CATH LAB;  Service: Cardiovascular;  Laterality: N/A;   MASS EXCISION Right 08/10/2019   Procedure: EXCISION OF CYST FROM RIGHT SCALP;  Surgeon: Curvin Deward III, MD;  Location: [facility];  Service: General;  Laterality: Right;    Current Outpatient Medications  Medication Sig Dispense Refill   acetaminophen  (TYLENOL ) 500 MG tablet Take 1,000 mg by mouth every 4 (four) hours as needed for moderate pain.     albuterol  (VENTOLIN  HFA) 108 (90 Base) MCG/ACT inhaler Inhale 2 puffs into the lungs every 6 (six) hours as needed for wheezing or shortness of breath. 54 g 1   aspirin  EC 81 MG EC tablet Take 1 tablet (81 mg total) by mouth daily. (Patient taking differently: Take 81 mg by mouth in the morning.)  Biotin 89999 MCG TABS Take 10,000 mcg by mouth in the morning.     cholecalciferol  (VITAMIN D) 25 MCG (1000 UT) tablet Take 1,000 Units by mouth every evening.     cyclobenzaprine  (FLEXERIL ) 5 MG tablet Take 1 tablet (5 mg total) by mouth 3 (three) times daily as needed for muscle spasms. 30 tablet 1   diclofenac  sodium (VOLTAREN ) 1 % GEL Apply 2 g topically 4 (four) times daily. (Patient taking differently: Apply 2 g topically 4 (four) times daily as needed (pain).) 100 g 3   divalproex  (DEPAKOTE  ER) 500 MG 24 hr tablet Take 1 tablet (500 mg total) by mouth daily. 90 tablet 3   dofetilide  (TIKOSYN ) 250 MCG capsule TAKE 1 CAPSULE(250 MCG) BY MOUTH TWICE DAILY 60 capsule 3   doxycycline  (VIBRAMYCIN ) 100 MG capsule Take 100 mg by mouth every evening.     ezetimibe  (ZETIA ) 10 MG tablet TAKE 1 TABLET(10 MG) BY MOUTH DAILY (Patient taking differently: Take 10 mg by mouth in the morning.) 90  tablet 3   furosemide  (LASIX ) 20 MG tablet Take 1 tablet (20 MG) By mouth twice daily. As needed, patient may take 20 MG (1 tablet) additional Lasix  PRN by mouth daily x 4 days as directed per Alleviate Research HF Study PRN plan (Patient taking differently: Take 20 mg by mouth 2 (two) times daily. Take 1 tablet (20 MG) By mouth twice daily. As needed, patient may take 20 MG (1 tablet) additional Lasix  PRN by mouth daily x 4 days as directed per Alleviate Research HF Study PRN plan) 180 tablet 3   gabapentin  (NEURONTIN ) 300 MG capsule TAKE 1 CAPSULE(300 MG) BY MOUTH TWICE DAILY 180 capsule 3   Glucosamine-Chondroitin (GLUCOSAMINE CHONDR COMPLEX PO) Take 500 mg by mouth 2 (two) times daily.      hydrALAZINE  (APRESOLINE ) 50 MG tablet Take 1 tablet (50 mg total) by mouth 3 (three) times daily. 270 tablet 3   isosorbide  mononitrate (IMDUR ) 30 MG 24 hr tablet TAKE 1 TABLET BY MOUTH EVERY DAY (Patient taking differently: 30 mg in the morning.) 90 tablet 3   montelukast  (SINGULAIR ) 10 MG tablet TAKE 1 TABLET(10 MG) BY MOUTH AT BEDTIME 90 tablet 3   Multiple Vitamin (MULTIVITAMIN WITH MINERALS) TABS tablet Take 1 tablet by mouth every evening.     nitroGLYCERIN  (NITROSTAT ) 0.4 MG SL tablet Place 1 tablet (0.4 mg total) under the tongue every 5 (five) minutes as needed for chest pain. 25 tablet 11   Omega-3 Fatty Acids (FISH OIL) 1200 MG CAPS Take 1,200 mg by mouth 2 (two) times daily.     potassium chloride  (KLOR-CON ) 10 MEQ tablet As needed patient may take 10 mEq additional Potassium PRN by mouth daily x 4 days as directed per Alleviate Research HF Study PRN plan (Patient taking differently: Take 10 mEq by mouth as needed. As needed patient may take 10 mEq additional Potassium PRN by mouth daily x 4 days as directed per Alleviate Research HF Study PRN plan) 90 tablet 3   potassium chloride  SA (KLOR-CON  M) 20 MEQ tablet TAKE 1 TABLET BY MOUTH EVERY DAY 90 tablet 1   ramipril  (ALTACE ) 10 MG capsule TAKE 2  CAPSULES(20 MG) BY MOUTH DAILY (Patient taking differently: Take 20 mg by mouth every evening.) 180 capsule 3   rivaroxaban  (XARELTO ) 20 MG TABS tablet TAKE 1 TABLET(20 MG) BY MOUTH DAILY WITH SUPPER 90 tablet 1   tamsulosin  (FLOMAX ) 0.4 MG CAPS capsule TAKE 1 CAPSULE(0.4 MG) BY MOUTH DAILY 90  capsule 3   Tetrahydrozoline HCl (VISINE OP) Place 1 drop into both eyes daily as needed (irritation).     vitamin C  (ASCORBIC ACID ) 500 MG tablet Take 500 mg by mouth daily.     zolpidem  (AMBIEN  CR) 12.5 MG CR tablet Take 1 tablet (12.5 mg total) by mouth at bedtime. 90 tablet 1   guaiFENesin  (MUCINEX ) 600 MG 12 hr tablet Take 600 mg by mouth 2 (two) times daily.     REPATHA  SURECLICK 140 MG/ML SOAJ ADMINISTER 1 ML UNDER THE SKIN EVERY 14 DAYS (Patient not taking: Reported on 11/16/2023) 6 mL 3   No current facility-administered medications for this encounter.    Allergies  Allergen Reactions   Statins     Myalgias with elevated CPKs    ROS- All systems are reviewed and negative except as per the HPI above.  Physical Exam: Vitals:   11/16/23 0849  BP: (!) 150/64  Pulse: 63  Weight: 88 kg  Height: 5' 10 (1.778 m)    GEN- The patient is well appearing, alert and oriented x 3 today.   Neck - no JVD or carotid bruit noted Lungs- Clear to ausculation bilaterally, normal work of breathing Heart- Regular rate and rhythm, no murmurs, rubs or gallops, PMI not laterally displaced Extremities- no clubbing, cyanosis, or edema Skin - no rash or ecchymosis noted   Wt Readings from Last 3 Encounters:  11/16/23 88 kg  10/11/23 88.1 kg  08/12/23 87.8 kg    ECG today demonstrates  Vent. rate 63 BPM PR interval * ms QRS duration 118 ms QT/QTcB 442/452 ms P-R-T axes 122 -22 -9 Undetermined rhythm - appears to be in NSR Non-specific intra-ventricular conduction delay Nonspecific T wave abnormality Abnormal ECG When compared with ECG of 12-Aug-2023 09:03, PREVIOUS ECG IS PRESENT  Echo 01/21/20  demonstrated   1. Left ventricular ejection fraction, by estimation, is 55 to 60%. The  left ventricle has normal function. Left ventricular endocardial border  not optimally defined to evaluate regional wall motion. There is mild left  ventricular hypertrophy. Left  ventricular diastolic parameters are consistent with Grade II diastolic  dysfunction (pseudonormalization).   2. Right ventricular systolic function is normal. The right ventricular  size is mildly enlarged. There is mildly elevated pulmonary artery  systolic pressure.   3. Left atrial size was moderately dilated.   4. Right atrial size was mildly dilated.   5. The mitral valve is normal in structure. Mild mitral valve  regurgitation.   6. The aortic valve is tricuspid. Aortic valve regurgitation is mild.  Mild aortic valve sclerosis is present, with no evidence of aortic valve  stenosis.   7. The inferior vena cava is normal in size with greater than 50%  respiratory variability, suggesting right atrial pressure of 3 mmHg.   Epic records are reviewed at length today  CHA2DS2-VASc Score = 3  The patient's score is based upon: CHF History: 0 HTN History: 1 Diabetes History: 0 Stroke History: 0 Vascular Disease History: 1 Age Score: 1 Gender Score: 0      ASSESSMENT AND PLAN: 1. Persistent Atrial Fibrillation (ICD10:  I48.19) The patient's CHA2DS2-VASc score is 3, indicating a 3.2% annual risk of stroke.   S/p afib ablation 03/19/20 with repeat ablation 10/25/22. S/p Tikosyn  admission 6/11-14/24.  He is currently in NSR. He is doing well overall from an Afib standpoint.  High risk medication monitoring (ICD10: J342684) Patient requires ongoing monitoring for anti-arrhythmic medication which has the potential to cause  life threatening arrhythmias or AV block. Qtc stable. Continue Tikosyn  250 mcg BID. Bmet and mag drawn today.   When he is in atrial fibrillation, he complains of shortness of breath, at the level  of NYHA class II.  2. Secondary Hypercoagulable State (ICD10:  D68.69) The patient is at significant risk for stroke/thromboembolism based upon his CHA2DS2-VASc Score of 3.  Continue Xarelto  No missed doses.  3. HTN His hydralazine  was increased to 50 mg TID and he has noted overall much improvement with systolic BP 120-140s at home.   4. Obstructive sleep apnea He has Inspire device.   5. CAD s/p CABG No chest pain today.     F/u 3 months for Tikosyn  surveillance.     Fairy Heinrich, PA-C [facility] [address] [place], [place] [address] [phone] 11/16/2023 9:19 AM

## 2023-11-18 DIAGNOSIS — Z951 Presence of aortocoronary bypass graft: Secondary | ICD-10-CM | POA: Diagnosis not present

## 2023-11-18 DIAGNOSIS — I6522 Occlusion and stenosis of left carotid artery: Secondary | ICD-10-CM | POA: Diagnosis not present

## 2023-11-18 LAB — LIPID PANEL
Chol/HDL Ratio: 3.1 {ratio} (ref 0.0–5.0)
Cholesterol, Total: 113 mg/dL (ref 100–199)
HDL: 36 mg/dL — ABNORMAL LOW (ref >39)
LDL Chol Calc (NIH): 59 mg/dL (ref 0–99)
Triglycerides: 90 mg/dL (ref 0–149)
VLDL Cholesterol Cal: 18 mg/dL (ref 5–40)

## 2023-11-18 NOTE — Telephone Encounter (Signed)
 Cancel route. I see now the labs are ordered

## 2023-11-18 NOTE — Telephone Encounter (Signed)
 Antonetta Kitchen, RN     11/14/23  4:15 PM Note Called patient to let him know we need lab work. Placed order for labs and patient will get them done this week.

## 2023-11-18 NOTE — Telephone Encounter (Deleted)
 Can we get updated labs so I can finish the prior authorization please?

## 2023-11-22 ENCOUNTER — Telehealth: Payer: Self-pay | Admitting: Pharmacy Technician

## 2023-11-22 NOTE — Telephone Encounter (Signed)
Pharmacy Patient Advocate Encounter   Received notification from Pt Calls Messages that prior authorization for repatha is required/requested.   Insurance verification completed.   The patient is insured through [facility] ADVANTAGE/RX ADVANCE .   Appeal faxed 11/22/23 10:04am

## 2023-11-22 NOTE — Telephone Encounter (Signed)
Pa denied since didn't get labs back in time. See new encounter for appeal

## 2023-11-24 NOTE — Telephone Encounter (Signed)
Made patient aware that Repatha prior authorization has been approved.   Pharmacy Patient Advocate Encounter   Received notification from [facility] ADVANTAGE/RX ADVANCE that Prior Authorization for repatha has been APPROVED from 11/24/23 to 11/21/24. Spoke to pharmacy to process.Copay is $117.50 for 90 days .     PA #/Case ID/Reference #: [id number]

## 2023-11-24 NOTE — Telephone Encounter (Signed)
Pharmacy Patient Advocate Encounter  Received notification from [facility] ADVANTAGE/RX ADVANCE that Prior Authorization for repatha has been APPROVED from 11/24/23 to 11/21/24. Spoke to pharmacy to process.Copay is $117.50 for 90 days .    PA #/Case ID/Reference #: [id number]

## 2023-11-25 ENCOUNTER — Other Ambulatory Visit: Payer: Self-pay | Admitting: Internal Medicine

## 2023-12-07 NOTE — Progress Notes (Signed)
 CARDIOLOGY CONSULT NOTE       Patient ID: [name] Brown MRN: [id number] DOB/AGE: 1951/04/21 73 y.o.   Primary Physician: [name] Broker, MD Primary Cardiologist: [name] Brown Reason for Consultation: [name] Brown    HPI:  73 y.o. with history of CAD/CABG distant LIMA to LAD, RIMA to PDA left radial to OM1. Cath 2015 with occluded radial to OM native vessel only 30-40% stenosed. Normal myovue 11/08/18.  PVD followed by Dr [name] Brown with EVAR  Duplex 05/10/18 no residual AAA or endoleak. 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    Afib noted in 2018 CHADVASC 3 on eliquis Afib ablation June 2021 and amiodarone d/c post procedure Required [facility] for recurrence 01/13/21. Repeat ablation 10/25/22 but still appears to be in rate controlled afib today DCC again 03/22/23 Tikosyn admission 03/22/23 only 250 mcg bid dosing Kardiamobile suggests some break through    Sees Dr [name] Brown for OSA Intolerant to CPAP/Bipap.  Had hypoglossal N stimulator placement 07/30/20 Settings titrated recently   Getting GERD with eliquis has been on H2 blocker twice with symptoms returning when he stops Changed to xarelto 04/22/21  Had bilateral hernia surgery with Dr [name] Brown 08/06/22 slow to recover and painful   No angina. Has f/u with [facility] for stent graft. Looking [place] to [place] in [place] for 48 th anniversary    ROS All other systems reviewed and negative except as noted above  Past Medical History:  Diagnosis Date   Aortic aneurysm [facility]) Endograf 2009   Arthritis    Atrial fibrillation (HCC)    Bradycardia    CAD (coronary artery disease) CABG 03/2000   Median sternotomy for coronary artery bypass grafting x 3 (left   HTN (hypertension)    Hx of CABG 2001   severe 3 vessel disease   Hx of colonic polyp 06/2010   Hyperplastic    Hyperlipidemia    Myocardial infarction [facility])    Sleep apnea     Family History  Problem Relation Age of Onset    Heart attack Mother    Heart disease Mother    Heart attack Father    Heart disease Father    Rectal cancer Neg Hx    Stomach cancer Neg Hx    Pancreatic cancer Neg Hx    Esophageal cancer Neg Hx    Colon polyps Neg Hx    Colon cancer Neg Hx     Social History   Socioeconomic History   Marital status: Married    Spouse name: Not on file   Number of children: Not on file   Years of education: Not on file   Highest education level: 12th grade  Occupational History   Not on file  Tobacco Use   Smoking status: Former    Current packs/day: 0.00    Average packs/day: 2.0 packs/day for 30.0 years (60.0 ttl pk-yrs)    Types: Cigarettes    Start date: 01/04/1962    Quit date: 01/05/1992    Years since quitting: 31.9   Smokeless tobacco: Never   Tobacco comments:    Former smoker quit 19 yrs ago 11/22/22  Vaping Use   Vaping status: Never Used  Substance and Sexual Activity   Alcohol use: No   Drug use: Yes    Types: Marijuana   Sexual activity: Not on file  Other Topics Concern   Not on file  Social History Narrative   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   Social Drivers of Corporate investment banker Strain: Low Risk  (10/10/2023)   Overall Financial Resource Strain (CARDIA)    Difficulty of Paying Living Expenses: Not hard at all  Food Insecurity: No Food Insecurity (10/10/2023)   Hunger Vital Sign    Worried About Running Out of Food in the Last Year: Never true    Ran Out of Food in the Last Year: Never true  Transportation Needs: No Transportation Needs (10/10/2023)   PRAPARE - Administrator, Civil Service (Medical): No    Lack of Transportation (Non-Medical): No  Physical Activity: Unknown (10/10/2023)   Exercise Vital Sign    Days of Exercise per Week: Patient declined    Minutes of Exercise per Session: 20 min  Stress: No Stress Concern Present (10/10/2023)   [employer] of Occupational Health -  Occupational Stress Questionnaire    Feeling of Stress : Not at all  Social Connections: Unknown (10/10/2023)   Social Connection and Isolation Panel [NHANES]    Frequency of Communication with Friends and Family: Patient declined    Frequency of Social Gatherings with Friends and Family: Patient declined    Attends Religious Services: Patient declined    Database administrator or Organizations: No    Attends Banker Meetings: Never    Marital Status: Married  Catering manager Violence: Not At Risk (05/02/2023)   Humiliation, Afraid, Rape, and Kick questionnaire    Fear of Current or Ex-Partner: No    Emotionally Abused: No    Physically Abused: No    Sexually Abused: No    Past Surgical History:  Procedure Laterality Date   ATRIAL FIBRILLATION ABLATION N/A 03/19/2020   Procedure: ATRIAL FIBRILLATION ABLATION;  Surgeon: [name] Lemming, MD;  [facility]: [facility];  Service: Cardiovascular;  Laterality: N/A;   ATRIAL FIBRILLATION ABLATION N/A 10/25/2022   Procedure: ATRIAL FIBRILLATION ABLATION;  Surgeon: [name] Lemming, MD;  Location: [facility] INVASIVE CV LAB;  Service: Cardiovascular;  Laterality: N/A;   CARDIOVERSION N/A 11/28/2018   Procedure: CARDIOVERSION;  Surgeon: [name] Poisson, MD;  Location: [facility] ENDOSCOPY;  Service: Cardiovascular;  Laterality: N/A;   CARDIOVERSION N/A 01/13/2021   Procedure: CARDIOVERSION;  Surgeon: [name] Nose, MD;  Location: [facility] ENDOSCOPY;  Service: Cardiovascular;  Laterality: N/A;   CARDIOVERSION N/A 01/04/2023   Procedure: CARDIOVERSION;  Surgeon: [name] Sprague, MD;  Location: [facility] ENDOSCOPY;  Service: Cardiovascular;  Laterality: N/A;   COLONOSCOPY     CORONARY ARTERY BYPASS GRAFT     AAA stent graft on 12/01/2007 at [facility]  coronary bypass in 2001. Dr. Cornelius Brown.   DRUG INDUCED ENDOSCOPY Right 02/18/2020   Procedure: DRUG INDUCED ENDOSCOPY;  Surgeon: [name] Reading, MD;  Location: [facility];  Service: ENT;  Laterality: Right;   FOOT SURGERY Left 07/19/2017   IMPLANTATION OF HYPOGLOSSAL NERVE STIMULATOR Right 07/30/2020   Procedure: IMPLANTATION OF HYPOGLOSSAL NERVE STIMULATOR Implantation of Chest wall respirator Sensor Electrode Electronic Analysis of Implanted Neurostimulator Pulse Generator;  Surgeon: [name] Reading, MD;  Location: [facility];  Service: ENT;  Laterality: Right;   INGUINAL HERNIA REPAIR Bilateral 08/06/2022   Procedure: LAPAROSCOPIC BILATERAL INGUINAL HERNIA REPAIR WITH MESH AND PRIMARY CLOSURE OF UMBILICAL HERNIA;  Surgeon: Quentin Ore, MD;  Location: [facility] ORS;  Service: General;  Laterality: Bilateral;   LEFT HEART CATHETERIZATION WITH CORONARY ANGIOGRAM N/A 10/26/2013  Procedure: LEFT HEART CATHETERIZATION WITH CORONARY ANGIOGRAM;  Surgeon: Junaid Wurzer M [place], MD;  Location: [facility] CATH LAB;  Service: Cardiovascular;  Laterality: N/A;   MASS EXCISION Right 08/10/2019   Procedure: EXCISION OF CYST FROM RIGHT SCALP;  Surgeon: Griselda Miner, MD;  Location: [facility];  Service: General;  Laterality: Right;      Current Outpatient Medications:    acetaminophen (TYLENOL) 500 MG tablet, Take 1,000 mg by mouth every 4 (four) hours as needed for moderate pain., Disp: , Rfl:    albuterol (VENTOLIN HFA) 108 (90 Base) MCG/ACT inhaler, Inhale 2 puffs into the lungs every 6 (six) hours as needed for wheezing or shortness of breath., Disp: 54 g, Rfl: 1   aspirin EC 81 MG EC tablet, Take 1 tablet (81 mg total) by mouth daily. (Patient taking differently: Take 81 mg by mouth in the morning.), Disp: , Rfl:    Biotin 16109 MCG TABS, Take 10,000 mcg by mouth in the morning., Disp: , Rfl:    cholecalciferol (VITAMIN D) 25 MCG (1000 UT) tablet, Take 1,000 Units by mouth every evening., Disp: , Rfl:    diclofenac sodium (VOLTAREN) 1 % GEL, Apply 2 g topically 4 (four) times daily. (Patient taking differently: Apply 2 g topically 4 (four) times  daily as needed (pain).), Disp: 100 g, Rfl: 3   divalproex (DEPAKOTE ER) 500 MG 24 hr tablet, Take 1 tablet (500 mg total) by mouth daily., Disp: 90 tablet, Rfl: 3   dofetilide (TIKOSYN) 250 MCG capsule, TAKE 1 CAPSULE(250 MCG) BY MOUTH TWICE DAILY, Disp: 60 capsule, Rfl: 3   doxycycline (VIBRAMYCIN) 100 MG capsule, Take 100 mg by mouth every evening., Disp: , Rfl:    ezetimibe (ZETIA) 10 MG tablet, TAKE 1 TABLET(10 MG) BY MOUTH DAILY (Patient taking differently: Take 10 mg by mouth in the morning.), Disp: 90 tablet, Rfl: 3   furosemide (LASIX) 20 MG tablet, Take 1 tablet (20 MG) By mouth twice daily. As needed, patient may take 20 MG (1 tablet) additional Lasix PRN by mouth daily x 4 days as directed per Alleviate Research HF Study PRN plan (Patient taking differently: Take 20 mg by mouth 2 (two) times daily. Take 1 tablet (20 MG) By mouth twice daily. As needed, patient may take 20 MG (1 tablet) additional Lasix PRN by mouth daily x 4 days as directed per Alleviate Research HF Study PRN plan), Disp: 180 tablet, Rfl: 3   gabapentin (NEURONTIN) 300 MG capsule, TAKE 1 CAPSULE(300 MG) BY MOUTH TWICE DAILY, Disp: 180 capsule, Rfl: 3   Glucosamine-Chondroitin (GLUCOSAMINE CHONDR COMPLEX PO), Take 500 mg by mouth 2 (two) times daily. , Disp: , Rfl:    guaiFENesin (MUCINEX) 600 MG 12 hr tablet, Take 600 mg by mouth 2 (two) times daily., Disp: , Rfl:    hydrALAZINE (APRESOLINE) 50 MG tablet, Take 1 tablet (50 mg total) by mouth 3 (three) times daily., Disp: 270 tablet, Rfl: 3   isosorbide mononitrate (IMDUR) 30 MG 24 hr tablet, TAKE 1 TABLET BY MOUTH EVERY DAY (Patient taking differently: 30 mg in the morning.), Disp: 90 tablet, Rfl: 3   montelukast (SINGULAIR) 10 MG tablet, TAKE 1 TABLET(10 MG) BY MOUTH AT BEDTIME, Disp: 90 tablet, Rfl: 3   Multiple Vitamin (MULTIVITAMIN WITH MINERALS) TABS tablet, Take 1 tablet by mouth every evening., Disp: , Rfl:    Omega-3 Fatty Acids (FISH OIL) 1200 MG CAPS, Take 1,200  mg by mouth 2 (two) times daily., Disp: , Rfl:  potassium chloride (KLOR-CON) 10 MEQ tablet, As needed patient may take 10 mEq additional Potassium PRN by mouth daily x 4 days as directed per Alleviate Research HF Study PRN plan (Patient taking differently: Take 10 mEq by mouth as needed. As needed patient may take 10 mEq additional Potassium PRN by mouth daily x 4 days as directed per Alleviate Research HF Study PRN plan), Disp: 90 tablet, Rfl: 3   potassium chloride SA (KLOR-CON M) 20 MEQ tablet, TAKE 1 TABLET BY MOUTH EVERY DAY, Disp: 90 tablet, Rfl: 0   ramipril (ALTACE) 10 MG capsule, TAKE 2 CAPSULES(20 MG) BY MOUTH DAILY (Patient taking differently: Take 20 mg by mouth every evening.), Disp: 180 capsule, Rfl: 3   REPATHA SURECLICK 140 MG/ML SOAJ, ADMINISTER 1 ML UNDER THE SKIN EVERY 14 DAYS, Disp: 6 mL, Rfl: 3   tamsulosin (FLOMAX) 0.4 MG CAPS capsule, TAKE 1 CAPSULE(0.4 MG) BY MOUTH DAILY, Disp: 90 capsule, Rfl: 3   Tetrahydrozoline HCl (VISINE OP), Place 1 drop into both eyes daily as needed (irritation)., Disp: , Rfl:    vitamin C (ASCORBIC ACID) 500 MG tablet, Take 500 mg by mouth daily., Disp: , Rfl:    XARELTO 20 MG TABS tablet, TAKE 1 TABLET(20 MG) BY MOUTH DAILY WITH SUPPER, Disp: 90 tablet, Rfl: 1   zolpidem (AMBIEN CR) 12.5 MG CR tablet, TAKE 1 TABLET(12.5 MG) BY MOUTH AT BEDTIME, Disp: 90 tablet, Rfl: 1   cyclobenzaprine (FLEXERIL) 5 MG tablet, Take 1 tablet (5 mg total) by mouth 3 (three) times daily as needed for muscle spasms. (Patient not taking: Reported on 12/19/2023), Disp: 30 tablet, Rfl: 1   nitroGLYCERIN (NITROSTAT) 0.4 MG SL tablet, Place 1 tablet (0.4 mg total) under the tongue every 5 (five) minutes as needed for chest pain. (Patient not taking: Reported on 12/19/2023), Disp: 25 tablet, Rfl: 11    Physical Exam:   Affect appropriate Healthy:  appears stated age HEENT: nerve stimulator present  Neck supple with no adenopathy JVP normal loud left bruits no  thyromegaly Lungs clear with no wheezing and good diaphragmatic motion Heart:  S1/S2 SEM murmur, no rub, gallop or click PMI normal post sternotomy and left radial harvest  Abdomen: benighn, BS positve, no tenderness, no AAA no bruit.  No HSM or HJR post hernia repair bilateral  Distal pulses intact with no bruits No edema Neuro non-focal Skin warm and dry No muscular weakness Inspiris generator under right clavicle    Labs:   Lab Results  Component Value Date   WBC 5.3 08/12/2023   HGB 12.8 (L) 08/12/2023   HCT 40.1 08/12/2023   MCV 102 (H) 08/12/2023   PLT 217 08/12/2023   No results for input(s): "NA", "K", "CL", "CO2", "BUN", "CREATININE", "CALCIUM", "PROT", "BILITOT", "ALKPHOS", "ALT", "AST", "GLUCOSE" in the last 168 hours.  Invalid input(s): "LABALBU" Lab Results  Component Value Date   CKTOTAL 102 02/21/2015   TROPONINI 0.06 (HH) 11/29/2018    Lab Results  Component Value Date   CHOL 113 11/18/2023   CHOL 109 02/04/2021   CHOL 194 11/04/2020   Lab Results  Component Value Date   HDL 36 (L) 11/18/2023   HDL 34 (L) 02/04/2021   HDL 32 (L) 11/04/2020   Lab Results  Component Value Date   LDLCALC 59 11/18/2023   LDLCALC 59 02/04/2021   LDLCALC 141 (H) 11/04/2020   Lab Results  Component Value Date   TRIG 90 11/18/2023   TRIG 78 02/04/2021   TRIG 114 11/04/2020  Lab Results  Component Value Date   CHOLHDL 3.1 11/18/2023   CHOLHDL 3.2 02/04/2021   CHOLHDL 6.1 (H) 11/04/2020   No results found for: "LDLDIRECT"    Radiology: No results found.  EKG: SR rate 68 normal 03/26/21    ASSESSMENT AND PLAN:   1:  CAD/CABG:  known occlusion of radial to OM with non obstructive native disease in this area Non ischemic myovue 11/08/18 continue medical RX 2. PVD:  f/u [facility] for EVAR [place] 05/27/21 no endoleak  3. PAF:  F/U [place] post ablation x 2  Amiodarone d/c [facility] April 2022 and repeat ablation January 2024 Subsequent Tikosyn admission and seems to be holding  NSR much better CHADVASC 3 on Xarelto with less GERD than when taking eliquis 4. HTN:  Well controlled.  Continue current medications and low sodium Dash type diet.   5. OSA:  with hypoglossal nerve stimulator f/u sleep study Dr [name] Brown to titrate settings better  6. HLD:  on zetia and repatha LDL 59 02/04/21 7. Carotid:  40-59% LICA stenosis f/u duplex 07/2024    Carotid duplex 07/2024   F/U [place] 6 months  F/U in a year with me    Signed: Charlton Haws 12/19/2023, 9:44 AM

## 2023-12-12 ENCOUNTER — Other Ambulatory Visit: Payer: Self-pay | Admitting: Cardiology

## 2023-12-12 ENCOUNTER — Other Ambulatory Visit: Payer: Self-pay | Admitting: Internal Medicine

## 2023-12-12 ENCOUNTER — Other Ambulatory Visit: Payer: Self-pay | Admitting: Cardiovascular Disease

## 2023-12-12 NOTE — Telephone Encounter (Signed)
 Prescription refill request for Xarelto received.  Indication:afib Last office visit:2/25 Weight:88  kg Age:73 Scr:1.23  2/25 CrCl:67.57  ml/min  Prescription refilled

## 2023-12-13 ENCOUNTER — Other Ambulatory Visit: Payer: Self-pay | Admitting: Cardiovascular Disease

## 2023-12-14 DIAGNOSIS — Z006 Encounter for examination for normal comparison and control in clinical research program: Secondary | ICD-10-CM

## 2023-12-14 NOTE — Research (Signed)
 Alleviate HF Research Study 10 Month F/U  No adverse events or cardiovascular mediation changes to report at this time.   Current Outpatient Medications:    acetaminophen (TYLENOL) 500 MG tablet, Take 1,000 mg by mouth every 4 (four) hours as needed for moderate pain., Disp: , Rfl:    albuterol (VENTOLIN HFA) 108 (90 Base) MCG/ACT inhaler, Inhale 2 puffs into the lungs every 6 (six) hours as needed for wheezing or shortness of breath., Disp: 54 g, Rfl: 1   aspirin EC 81 MG EC tablet, Take 1 tablet (81 mg total) by mouth daily. (Patient taking differently: Take 81 mg by mouth in the morning.), Disp: , Rfl:    Biotin 95621 MCG TABS, Take 10,000 mcg by mouth in the morning., Disp: , Rfl:    cholecalciferol (VITAMIN D) 25 MCG (1000 UT) tablet, Take 1,000 Units by mouth every evening., Disp: , Rfl:    cyclobenzaprine (FLEXERIL) 5 MG tablet, Take 1 tablet (5 mg total) by mouth 3 (three) times daily as needed for muscle spasms., Disp: 30 tablet, Rfl: 1   diclofenac sodium (VOLTAREN) 1 % GEL, Apply 2 g topically 4 (four) times daily. (Patient taking differently: Apply 2 g topically 4 (four) times daily as needed (pain).), Disp: 100 g, Rfl: 3   divalproex (DEPAKOTE ER) 500 MG 24 hr tablet, Take 1 tablet (500 mg total) by mouth daily., Disp: 90 tablet, Rfl: 3   dofetilide (TIKOSYN) 250 MCG capsule, TAKE 1 CAPSULE(250 MCG) BY MOUTH TWICE DAILY, Disp: 60 capsule, Rfl: 3   doxycycline (VIBRAMYCIN) 100 MG capsule, Take 100 mg by mouth every evening., Disp: , Rfl:    ezetimibe (ZETIA) 10 MG tablet, TAKE 1 TABLET(10 MG) BY MOUTH DAILY (Patient taking differently: Take 10 mg by mouth in the morning.), Disp: 90 tablet, Rfl: 3   furosemide (LASIX) 20 MG tablet, Take 1 tablet (20 MG) By mouth twice daily. As needed, patient may take 20 MG (1 tablet) additional Lasix PRN by mouth daily x 4 days as directed per Alleviate Research HF Study PRN plan (Patient taking differently: Take 20 mg by mouth 2 (two) times daily.  Take 1 tablet (20 MG) By mouth twice daily. As needed, patient may take 20 MG (1 tablet) additional Lasix PRN by mouth daily x 4 days as directed per Alleviate Research HF Study PRN plan), Disp: 180 tablet, Rfl: 3   gabapentin (NEURONTIN) 300 MG capsule, TAKE 1 CAPSULE(300 MG) BY MOUTH TWICE DAILY, Disp: 180 capsule, Rfl: 3   Glucosamine-Chondroitin (GLUCOSAMINE CHONDR COMPLEX PO), Take 500 mg by mouth 2 (two) times daily. , Disp: , Rfl:    guaiFENesin (MUCINEX) 600 MG 12 hr tablet, Take 600 mg by mouth 2 (two) times daily., Disp: , Rfl:    hydrALAZINE (APRESOLINE) 50 MG tablet, Take 1 tablet (50 mg total) by mouth 3 (three) times daily., Disp: 270 tablet, Rfl: 3   isosorbide mononitrate (IMDUR) 30 MG 24 hr tablet, TAKE 1 TABLET BY MOUTH EVERY DAY (Patient taking differently: 30 mg in the morning.), Disp: 90 tablet, Rfl: 3   montelukast (SINGULAIR) 10 MG tablet, TAKE 1 TABLET(10 MG) BY MOUTH AT BEDTIME, Disp: 90 tablet, Rfl: 3   Multiple Vitamin (MULTIVITAMIN WITH MINERALS) TABS tablet, Take 1 tablet by mouth every evening., Disp: , Rfl:    nitroGLYCERIN (NITROSTAT) 0.4 MG SL tablet, Place 1 tablet (0.4 mg total) under the tongue every 5 (five) minutes as needed for chest pain., Disp: 25 tablet, Rfl: 11   Omega-3 Fatty  Acids (FISH OIL) 1200 MG CAPS, Take 1,200 mg by mouth 2 (two) times daily., Disp: , Rfl:    potassium chloride (KLOR-CON) 10 MEQ tablet, As needed patient may take 10 mEq additional Potassium PRN by mouth daily x 4 days as directed per Alleviate Research HF Study PRN plan (Patient taking differently: Take 10 mEq by mouth as needed. As needed patient may take 10 mEq additional Potassium PRN by mouth daily x 4 days as directed per Alleviate Research HF Study PRN plan), Disp: 90 tablet, Rfl: 3   potassium chloride SA (KLOR-CON M) 20 MEQ tablet, TAKE 1 TABLET BY MOUTH EVERY DAY, Disp: 30 tablet, Rfl: 0   ramipril (ALTACE) 10 MG capsule, TAKE 2 CAPSULES(20 MG) BY MOUTH DAILY (Patient taking  differently: Take 20 mg by mouth every evening.), Disp: 180 capsule, Rfl: 3   REPATHA SURECLICK 140 MG/ML SOAJ, ADMINISTER 1 ML UNDER THE SKIN EVERY 14 DAYS (Patient not taking: Reported on 11/16/2023), Disp: 6 mL, Rfl: 3   tamsulosin (FLOMAX) 0.4 MG CAPS capsule, TAKE 1 CAPSULE(0.4 MG) BY MOUTH DAILY, Disp: 90 capsule, Rfl: 3   Tetrahydrozoline HCl (VISINE OP), Place 1 drop into both eyes daily as needed (irritation)., Disp: , Rfl:    vitamin C (ASCORBIC ACID) 500 MG tablet, Take 500 mg by mouth daily., Disp: , Rfl:    XARELTO 20 MG TABS tablet, TAKE 1 TABLET(20 MG) BY MOUTH DAILY WITH SUPPER, Disp: 90 tablet, Rfl: 1   zolpidem (AMBIEN CR) 12.5 MG CR tablet, TAKE 1 TABLET(12.5 MG) BY MOUTH AT BEDTIME, Disp: 90 tablet, Rfl: 1

## 2023-12-16 ENCOUNTER — Other Ambulatory Visit (HOSPITAL_COMMUNITY): Payer: Self-pay | Admitting: Internal Medicine

## 2023-12-19 ENCOUNTER — Encounter: Payer: Self-pay | Admitting: Cardiovascular Disease

## 2023-12-19 ENCOUNTER — Ambulatory Visit: Payer: PPO | Attending: Cardiovascular Disease | Admitting: Cardiovascular Disease

## 2023-12-19 VITALS — BP 128/70 | HR 57 | Ht 70.0 in | Wt 196.0 lb

## 2023-12-19 DIAGNOSIS — Z951 Presence of aortocoronary bypass graft: Secondary | ICD-10-CM

## 2023-12-19 DIAGNOSIS — R0989 Other specified symptoms and signs involving the circulatory and respiratory systems: Secondary | ICD-10-CM | POA: Diagnosis not present

## 2023-12-19 DIAGNOSIS — I4819 Other persistent atrial fibrillation: Secondary | ICD-10-CM | POA: Diagnosis not present

## 2023-12-19 DIAGNOSIS — Z9889 Other specified postprocedural states: Secondary | ICD-10-CM | POA: Diagnosis not present

## 2023-12-19 NOTE — Patient Instructions (Signed)
 Medication Instructions:  Your physician recommends that you continue on your current medications as directed. Please refer to the Current Medication list given to you today.  *If you need a refill on your cardiac medications before your next appointment, please call your pharmacy*  Lab Work: If you have labs (blood work) drawn today and your tests are completely normal, you will receive your results only by: MyChart Message (if you have MyChart) OR A paper copy in the mail If you have any lab test that is abnormal or we need to change your treatment, we will call you to review the results.  Testing/Procedures: Your physician has requested that you have a carotid duplex in October. This test is an ultrasound of the carotid arteries in your neck. It looks at blood flow through these arteries that supply the brain with blood. Allow one hour for this exam. There are no restrictions or special instructions.   Follow-Up: At [facility], you and your health needs are our priority.  As part of our continuing mission to provide you with exceptional heart care, we have created designated Provider Care Teams.  These Care Teams include your primary Cardiologist (physician) and Advanced Practice Providers (APPs -  Physician Assistants and Nurse Practitioners) who all work together to provide you with the care you need, when you need it.  We recommend signing up for the patient portal called "MyChart".  Sign up information is provided on this After Visit Summary.  MyChart is used to connect with patients for Virtual Visits (Telemedicine).  Patients are able to view lab/test results, encounter notes, upcoming appointments, etc.  Non-urgent messages can be sent to your provider as well.   To learn more about what you can do with MyChart, go to [email or web address].    Your next appointment:   1 year(s)  Provider:   Charlton Haws, MD     Other Instructions       1st Floor: - Lobby -  Registration  - Pharmacy  - Lab - Cafe  2nd Floor: - PV Lab - Diagnostic Testing (echo, CT, nuclear med)  3rd Floor: - Vacant  4th Floor: - TCTS (cardiothoracic surgery) - AFib Clinic - Structural Heart Clinic - Vascular Surgery  - Vascular Ultrasound  5th Floor: - HeartCare Cardiology (general and EP) - Clinical Pharmacy for coumadin, hypertension, lipid, weight-loss medications, and med management appointments    Valet parking services will be available as well.

## 2023-12-27 ENCOUNTER — Other Ambulatory Visit: Payer: Self-pay | Admitting: Cardiovascular Disease

## 2024-01-01 ENCOUNTER — Other Ambulatory Visit: Payer: Self-pay | Admitting: Cardiovascular Disease

## 2024-01-24 ENCOUNTER — Other Ambulatory Visit: Payer: Self-pay | Admitting: Internal Medicine

## 2024-01-25 ENCOUNTER — Telehealth: Payer: Self-pay | Admitting: Pharmacy Technician

## 2024-01-25 ENCOUNTER — Other Ambulatory Visit (HOSPITAL_COMMUNITY): Payer: Self-pay

## 2024-01-25 NOTE — Telephone Encounter (Signed)
 Pharmacy Patient Advocate Encounter   Received notification from CoverMyMeds that prior authorization for Cyclobenzaprine HCl 5MG  tablets is required/requested.   Insurance verification completed.   The patient is insured through [facility] ADVANTAGE/RX ADVANCE .   Per test claim: PA required; PA submitted to above mentioned insurance via CoverMyMeds Key/confirmation #/EOC [id number] Status is pending

## 2024-01-26 ENCOUNTER — Other Ambulatory Visit: Payer: Self-pay | Admitting: Family Medicine

## 2024-01-26 ENCOUNTER — Ambulatory Visit (INDEPENDENT_AMBULATORY_CARE_PROVIDER_SITE_OTHER): Admitting: Family Medicine

## 2024-01-26 ENCOUNTER — Other Ambulatory Visit (HOSPITAL_COMMUNITY): Payer: Self-pay

## 2024-01-26 ENCOUNTER — Encounter: Payer: Self-pay | Admitting: Family Medicine

## 2024-01-26 ENCOUNTER — Ambulatory Visit (INDEPENDENT_AMBULATORY_CARE_PROVIDER_SITE_OTHER)

## 2024-01-26 VITALS — BP 132/76 | HR 47 | Temp 97.9°F | Ht 70.0 in | Wt 195.0 lb

## 2024-01-26 DIAGNOSIS — R1013 Epigastric pain: Secondary | ICD-10-CM

## 2024-01-26 DIAGNOSIS — I719 Aortic aneurysm of unspecified site, without rupture: Secondary | ICD-10-CM

## 2024-01-26 DIAGNOSIS — R110 Nausea: Secondary | ICD-10-CM

## 2024-01-26 DIAGNOSIS — R10816 Epigastric abdominal tenderness: Secondary | ICD-10-CM

## 2024-01-26 DIAGNOSIS — D6869 Other thrombophilia: Secondary | ICD-10-CM

## 2024-01-26 DIAGNOSIS — I4819 Other persistent atrial fibrillation: Secondary | ICD-10-CM

## 2024-01-26 DIAGNOSIS — R10819 Abdominal tenderness, unspecified site: Secondary | ICD-10-CM | POA: Diagnosis not present

## 2024-01-26 LAB — COMPREHENSIVE METABOLIC PANEL WITH GFR
ALT: 13 U/L (ref 0–53)
AST: 19 U/L (ref 0–37)
Albumin: 4.7 g/dL (ref 3.5–5.2)
Alkaline Phosphatase: 39 U/L (ref 39–117)
BUN: 26 mg/dL — ABNORMAL HIGH (ref 6–23)
CO2: 29 meq/L (ref 19–32)
Calcium: 9.8 mg/dL (ref 8.4–10.5)
Chloride: 101 meq/L (ref 96–112)
Creatinine, Ser: 1.21 mg/dL (ref 0.40–1.50)
GFR: 59.98 mL/min — ABNORMAL LOW (ref >60.00)
Glucose, Bld: 87 mg/dL (ref 70–99)
Potassium: 4.4 meq/L (ref 3.5–5.1)
Sodium: 138 meq/L (ref 135–145)
Total Bilirubin: 0.7 mg/dL (ref 0.2–1.2)
Total Protein: 7 g/dL (ref 6.0–8.3)

## 2024-01-26 LAB — CBC WITH DIFFERENTIAL/PLATELET
Basophils Absolute: 0 10*3/uL (ref 0.0–0.1)
Basophils Relative: 0.4 % (ref 0.0–3.0)
Eosinophils Absolute: 0.2 10*3/uL (ref 0.0–0.7)
Eosinophils Relative: 2.9 % (ref 0.0–5.0)
HCT: 37.9 % — ABNORMAL LOW (ref 39.0–52.0)
Hemoglobin: 12.8 g/dL — ABNORMAL LOW (ref 13.0–17.0)
Lymphocytes Relative: 38.6 % (ref 12.0–46.0)
Lymphs Abs: 2.5 10*3/uL (ref 0.7–4.0)
MCHC: 33.7 g/dL (ref 30.0–36.0)
MCV: 100.2 fl — ABNORMAL HIGH (ref 78.0–100.0)
Monocytes Absolute: 0.9 10*3/uL (ref 0.1–1.0)
Monocytes Relative: 13.2 % — ABNORMAL HIGH (ref 3.0–12.0)
Neutro Abs: 2.9 10*3/uL (ref 1.4–7.7)
Neutrophils Relative %: 44.9 % (ref 43.0–77.0)
Platelets: 217 10*3/uL (ref 150.0–400.0)
RBC: 3.78 Mil/uL — ABNORMAL LOW (ref 4.22–5.81)
RDW: 14.6 % (ref 11.5–15.5)
WBC: 6.5 10*3/uL (ref 4.0–10.5)

## 2024-01-26 LAB — HEMOCCULT GUIAC POC 1CARD (OFFICE): Fecal Occult Blood, POC: NEGATIVE

## 2024-01-26 LAB — AMYLASE: Amylase: 55 U/L (ref 27–131)

## 2024-01-26 LAB — LIPASE: Lipase: 36 U/L (ref 11.0–59.0)

## 2024-01-26 MED ORDER — PANTOPRAZOLE SODIUM 40 MG PO TBEC: 40.0000 mg | DELAYED_RELEASE_TABLET | Freq: Every day | ORAL | 1 refills | Status: DC

## 2024-01-26 NOTE — Progress Notes (Signed)
 Subjective:     Patient ID: [name] Brown, male    DOB: 1951-09-18, 73 y.o.   MRN: [id number]  Chief Complaint  Patient presents with   Nausea    Started a couple months ago, would wake him up in middle of night and will stay w him for a couple days, go away then come back. Tried pepto bismol and that hasn't helped     HPI   History of Present Illness          C/o a 2 month hx of epigastric pain and nausea. States pain does not radiate to his back or other regions. Pain upon awakening some nights.  Pain improves after eating    Hx of GERD.  He used to take a PPI but has not lately.   No changes in bowel habits.   Hx of cardioversion and ablations for A-fib.   Able to eat and drink.    Health Maintenance Due  Topic Date Due   COVID-19 Vaccine (3 - Moderna risk series) 08/03/2022   DTaP/Tdap/Td (2 - Td or Tdap) 10/11/2022    Past Medical History:  Diagnosis Date   Aortic aneurysm [facility]) Endograf 2009   Arthritis    Atrial fibrillation (HCC)    Bradycardia    CAD (coronary artery disease) CABG 03/2000   Median sternotomy for coronary artery bypass grafting x 3 (left   HTN (hypertension)    Hx of CABG 2001   severe 3 vessel disease   Hx of colonic polyp 06/2010   Hyperplastic    Hyperlipidemia    Myocardial infarction [facility]    Sleep apnea     Past Surgical History:  Procedure Laterality Date   ATRIAL FIBRILLATION ABLATION N/A 03/19/2020   Procedure: ATRIAL FIBRILLATION ABLATION;  Surgeon: Regan Lemming, MD;  Location: [facility] INVASIVE CV LAB;  Service: Cardiovascular;  Laterality: N/A;   ATRIAL FIBRILLATION ABLATION N/A 10/25/2022   Procedure: ATRIAL FIBRILLATION ABLATION;  Surgeon: Regan Lemming, MD;  Location: [facility] INVASIVE CV LAB;  Service: Cardiovascular;  Laterality: N/A;   CARDIOVERSION N/A 11/28/2018   Procedure: CARDIOVERSION;  Surgeon: Parke Poisson, MD;  Location: [facility] ENDOSCOPY;  Service: Cardiovascular;  Laterality: N/A;   CARDIOVERSION  N/A 01/13/2021   Procedure: CARDIOVERSION;  Surgeon: Chrystie Nose, MD;  Location: [facility] ENDOSCOPY;  Service: Cardiovascular;  Laterality: N/A;   CARDIOVERSION N/A 01/04/2023   Procedure: CARDIOVERSION;  Surgeon: Meriam Sprague, MD;  Location: [facility] ENDOSCOPY;  Service: Cardiovascular;  Laterality: N/A;   COLONOSCOPY     CORONARY ARTERY BYPASS GRAFT     AAA stent graft on 12/01/2007 at [facility]  coronary bypass in 2001. Dr. Cornelius Moras.   DRUG INDUCED ENDOSCOPY Right 02/18/2020   Procedure: DRUG INDUCED ENDOSCOPY;  Surgeon: Christia Reading, MD;  Location: [facility];  Service: ENT;  Laterality: Right;   FOOT SURGERY Left 07/19/2017   IMPLANTATION OF HYPOGLOSSAL NERVE STIMULATOR Right 07/30/2020   Procedure: IMPLANTATION OF HYPOGLOSSAL NERVE STIMULATOR Implantation of Chest wall respirator Sensor Electrode Electronic Analysis of Implanted Neurostimulator Pulse Generator;  Surgeon: Christia Reading, MD;  Location: [facility];  Service: ENT;  Laterality: Right;   INGUINAL HERNIA REPAIR Bilateral 08/06/2022   Procedure: LAPAROSCOPIC BILATERAL INGUINAL HERNIA REPAIR WITH MESH AND PRIMARY CLOSURE OF UMBILICAL HERNIA;  Surgeon: Quentin Ore, MD;  Location: [facility] ORS;  Service: General;  Laterality: Bilateral;   LEFT HEART CATHETERIZATION WITH CORONARY ANGIOGRAM N/A 10/26/2013   Procedure:  LEFT HEART CATHETERIZATION WITH CORONARY ANGIOGRAM;  Surgeon: Peter M Swaziland, MD;  Location: [facility] CATH LAB;  Service: Cardiovascular;  Laterality: N/A;   MASS EXCISION Right 08/10/2019   Procedure: EXCISION OF CYST FROM RIGHT SCALP;  Surgeon: Griselda Miner, MD;  Location: [facility];  Service: General;  Laterality: Right;    Family History  Problem Relation Age of Onset   Heart attack Mother    Heart disease Mother    Heart attack Father    Heart disease Father    Rectal cancer Neg Hx    Stomach cancer Neg Hx    Pancreatic cancer Neg Hx    Esophageal  cancer Neg Hx    Colon polyps Neg Hx    Colon cancer Neg Hx     Social History   Socioeconomic History   Marital status: Married    Spouse name: Not on file   Number of children: Not on file   Years of education: Not on file   Highest education level: 12th grade  Occupational History   Not on file  Tobacco Use   Smoking status: Former    Current packs/day: 0.00    Average packs/day: 2.0 packs/day for 30.0 years (60.0 ttl pk-yrs)    Types: Cigarettes    Start date: 01/04/1962    Quit date: 01/05/1992    Years since quitting: 32.0   Smokeless tobacco: Never   Tobacco comments:    Former smoker quit 19 yrs ago 11/22/22  Vaping Use   Vaping status: Never Used  Substance and Sexual Activity   Alcohol use: No   Drug use: Yes    Types: Marijuana   Sexual activity: Not on file  Other Topics Concern   Not on file  Social History Narrative   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   Social Drivers of Corporate investment banker Strain: Low Risk  (10/10/2023)   Overall Financial Resource Strain (CARDIA)    Difficulty of Paying Living Expenses: Not hard at all  Food Insecurity: No Food Insecurity (10/10/2023)   Hunger Vital Sign    Worried About Running Out of Food in the Last Year: Never true    Ran Out of Food in the Last Year: Never true  Transportation Needs: No Transportation Needs (10/10/2023)   PRAPARE - Administrator, Civil Service (Medical): No    Lack of Transportation (Non-Medical): No  Physical Activity: Unknown (10/10/2023)   Exercise Vital Sign    Days of Exercise per Week: Patient declined    Minutes of Exercise per Session: 20 min  Stress: No Stress Concern Present (10/10/2023)   [employer] of Occupational Health - Occupational Stress Questionnaire    Feeling of Stress : Not at all  Social Connections: Unknown (10/10/2023)   Social Connection and Isolation Panel [NHANES]    Frequency of Communication  with Friends and Family: Patient declined    Frequency of Social Gatherings with Friends and Family: Patient declined    Attends Religious Services: Patient declined    Database administrator or Organizations: No    Attends Banker Meetings: Never    Marital Status: Married  Catering manager Violence: Not At Risk (05/02/2023)   Humiliation, Afraid, Rape, and Kick questionnaire    Fear of Current or Ex-Partner: No    Emotionally Abused: No    Physically Abused: No    Sexually Abused: No  Outpatient Medications Prior to Visit  Medication Sig Dispense Refill   acetaminophen (TYLENOL) 500 MG tablet Take 1,000 mg by mouth every 4 (four) hours as needed for moderate pain.     albuterol (VENTOLIN HFA) 108 (90 Base) MCG/ACT inhaler Inhale 2 puffs into the lungs every 6 (six) hours as needed for wheezing or shortness of breath. 54 g 1   aspirin EC 81 MG EC tablet Take 1 tablet (81 mg total) by mouth daily. (Patient taking differently: Take 81 mg by mouth in the morning.)     Biotin 09811 MCG TABS Take 10,000 mcg by mouth in the morning.     cholecalciferol (VITAMIN D) 25 MCG (1000 UT) tablet Take 1,000 Units by mouth every evening.     cyclobenzaprine (FLEXERIL) 5 MG tablet TAKE 1 TABLET(5 MG) BY MOUTH THREE TIMES DAILY AS NEEDED FOR MUSCLE SPASMS 30 tablet 0   diclofenac sodium (VOLTAREN) 1 % GEL Apply 2 g topically 4 (four) times daily. (Patient taking differently: Apply 2 g topically 4 (four) times daily as needed (pain).) 100 g 3   divalproex (DEPAKOTE ER) 500 MG 24 hr tablet Take 1 tablet (500 mg total) by mouth daily. 90 tablet 3   dofetilide (TIKOSYN) 250 MCG capsule TAKE 1 CAPSULE(250 MCG) BY MOUTH TWICE DAILY 60 capsule 3   doxycycline (VIBRAMYCIN) 100 MG capsule Take 100 mg by mouth every evening.     ezetimibe (ZETIA) 10 MG tablet TAKE 1 TABLET(10 MG) BY MOUTH DAILY 90 tablet 3   furosemide (LASIX) 20 MG tablet Take 1 tablet (20 MG) By mouth twice daily. As needed, patient  may take 20 MG (1 tablet) additional Lasix PRN by mouth daily x 4 days as directed per Alleviate Research HF Study PRN plan (Patient taking differently: Take 20 mg by mouth 2 (two) times daily. Take 1 tablet (20 MG) By mouth twice daily. As needed, patient may take 20 MG (1 tablet) additional Lasix PRN by mouth daily x 4 days as directed per Alleviate Research HF Study PRN plan) 180 tablet 3   gabapentin (NEURONTIN) 300 MG capsule TAKE 1 CAPSULE(300 MG) BY MOUTH TWICE DAILY 180 capsule 3   Glucosamine-Chondroitin (GLUCOSAMINE CHONDR COMPLEX PO) Take 500 mg by mouth 2 (two) times daily.      guaiFENesin (MUCINEX) 600 MG 12 hr tablet Take 600 mg by mouth 2 (two) times daily.     hydrALAZINE (APRESOLINE) 50 MG tablet Take 1 tablet (50 mg total) by mouth 3 (three) times daily. 270 tablet 3   isosorbide mononitrate (IMDUR) 30 MG 24 hr tablet TAKE 1 TABLET BY MOUTH EVERY DAY (Patient taking differently: 30 mg in the morning.) 90 tablet 3   montelukast (SINGULAIR) 10 MG tablet TAKE 1 TABLET(10 MG) BY MOUTH AT BEDTIME 90 tablet 3   Multiple Vitamin (MULTIVITAMIN WITH MINERALS) TABS tablet Take 1 tablet by mouth every evening.     nitroGLYCERIN (NITROSTAT) 0.4 MG SL tablet DISSOLVE ONE TABLET UNDER TONGUE AS NEEDED FOR CHEST PAIN EVERY 5 MINUTES 25 tablet 11   Omega-3 Fatty Acids (FISH OIL) 1200 MG CAPS Take 1,200 mg by mouth 2 (two) times daily.     potassium chloride (KLOR-CON) 10 MEQ tablet As needed patient may take 10 mEq additional Potassium PRN by mouth daily x 4 days as directed per Alleviate Research HF Study PRN plan (Patient taking differently: Take 10 mEq by mouth as needed. As needed patient may take 10 mEq additional Potassium PRN by mouth daily x 4  days as directed per Alleviate Research HF Study PRN plan) 90 tablet 3   potassium chloride SA (KLOR-CON M) 20 MEQ tablet TAKE 1 TABLET BY MOUTH EVERY DAY 90 tablet 0   ramipril (ALTACE) 10 MG capsule TAKE 2 CAPSULES(20 MG) BY MOUTH DAILY (Patient taking  differently: Take 20 mg by mouth every evening.) 180 capsule 3   REPATHA SURECLICK 140 MG/ML SOAJ ADMINISTER 1 ML UNDER THE SKIN EVERY 14 DAYS 6 mL 3   tamsulosin (FLOMAX) 0.4 MG CAPS capsule TAKE 1 CAPSULE(0.4 MG) BY MOUTH DAILY 90 capsule 3   Tetrahydrozoline HCl (VISINE OP) Place 1 drop into both eyes daily as needed (irritation).     vitamin C (ASCORBIC ACID) 500 MG tablet Take 500 mg by mouth daily.     XARELTO 20 MG TABS tablet TAKE 1 TABLET(20 MG) BY MOUTH DAILY WITH SUPPER 90 tablet 1   zolpidem (AMBIEN CR) 12.5 MG CR tablet TAKE 1 TABLET(12.5 MG) BY MOUTH AT BEDTIME 90 tablet 1   No facility-administered medications prior to visit.    Allergies  Allergen Reactions   Statins     Myalgias with elevated CPKs    Review of Systems  Constitutional:  Negative for chills and fever.  Respiratory:  Negative for shortness of breath.   Cardiovascular:  Negative for chest pain, palpitations and leg swelling.  Gastrointestinal:  Positive for abdominal pain and nausea. Negative for blood in stool, constipation, diarrhea and vomiting.  Genitourinary:  Negative for dysuria, frequency and urgency.  Musculoskeletal:  Negative for back pain.  Neurological:  Negative for dizziness and focal weakness.       Objective:    Physical Exam Constitutional:      General: He is not in acute distress.    Appearance: He is not ill-appearing.  HENT:     Mouth/Throat:     Mouth: Mucous membranes are moist.  Eyes:     Extraocular Movements: Extraocular movements intact.     Conjunctiva/sclera: Conjunctivae normal.  Cardiovascular:     Rate and Rhythm: Regular rhythm. Bradycardia present.  Pulmonary:     Effort: Pulmonary effort is normal.     Breath sounds: Normal breath sounds.  Abdominal:     General: Bowel sounds are increased. There is no distension.     Palpations: Abdomen is soft. There is no pulsatile mass.     Tenderness: There is abdominal tenderness in the epigastric area. There is no  guarding or rebound. Negative signs include Murphy's sign and McBurney's sign.  Musculoskeletal:     Cervical back: Normal range of motion and neck supple.  Skin:    General: Skin is warm and dry.  Neurological:     General: No focal deficit present.     Mental Status: He is alert and oriented to person, place, and time.  Psychiatric:        Mood and Affect: Mood normal.        Behavior: Behavior normal.        Thought Content: Thought content normal.      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 atrial fibrillation (HCC)   Relevant Orders   POCT occult blood stool (Completed)   CBC with Differential/Platelet (Completed)   Comprehensive metabolic panel with GFR (Completed)   Lipase (Completed)   Amylase (Completed)   DG Abd 2 Views (Completed)   Persistent atrial fibrillation (HCC)   Other Visit Diagnoses       Epigastric pain    -  Primary   Relevant Orders   POCT occult blood stool (Completed)   CBC with Differential/Platelet (Completed)   Comprehensive metabolic panel with GFR (Completed)   Lipase (Completed)   Amylase (Completed)   DG Abd 2 Views (Completed)     Epigastric abdominal tenderness without rebound tenderness       Relevant Orders   POCT occult blood stool (Completed)   CBC with Differential/Platelet (Completed)   Comprehensive metabolic panel with GFR (Completed)   Lipase (Completed)   Amylase (Completed)   DG Abd 2 Views (Completed)     Nausea       Relevant Orders   CBC with  Differential/Platelet (Completed)   Comprehensive metabolic panel with GFR (Completed)   Lipase (Completed)   Amylase (Completed)   DG Abd 2 Views (Completed)      No red flag symptoms. Suspect PUD, GERD, dyspepsia or gastritis. Start pantoprazole. Check labs to r/o pancreatitis.  Hemoccult negative.  STAT abdominal plain film negative.  Recommend follow up with PCP and possibly GI.  Strict precautions to go to the ED or call 911 for worsening symptoms.   I am having [name] Earl. Brown start on pantoprazole. I am also having him maintain his Glucosamine-Chondroitin (GLUCOSAMINE CHONDR COMPLEX PO), aspirin EC, cholecalciferol, Fish Oil, diclofenac sodium, Biotin, multivitamin with minerals, ascorbic acid, guaiFENesin, acetaminophen, Tetrahydrozoline HCl (VISINE OP), isosorbide mononitrate, furosemide, potassium chloride, ramipril, doxycycline, tamsulosin, gabapentin, divalproex, albuterol, Repatha SureClick, hydrALAZINE, zolpidem, montelukast, Xarelto, potassium chloride SA, dofetilide, ezetimibe, nitroGLYCERIN, and cyclobenzaprine.  Meds ordered this encounter  Medications   pantoprazole (PROTONIX) 40 MG tablet    Sig: Take 1 tablet (40 mg total) by mouth daily.    Dispense:  30 tablet    Refill:  1    Supervising Provider:   Bambi Lever A [[id number]]

## 2024-01-26 NOTE — Patient Instructions (Addendum)
 Please go downstairs for an abdominal x-ray and labs.  Start pantoprazole daily. Avoid foods or beverages that can worsen acid reflux.   We will be in touch with your results.  If you have any new or worsening symptoms such as severe abdominal pain, vomiting, blood in your stool, dizziness, chest pain then please call 911 or go to the emergency department.

## 2024-01-26 NOTE — Telephone Encounter (Signed)
 Pharmacy Patient Advocate Encounter  Received notification from [facility] [employer] that Prior Authorization for  Cyclobenzaprine HCl 5MG  tablets has been APPROVED from 01/25/24 to 02/22/24. Ran test claim, Copay is $1.04. This test claim was processed through [facility]- copay amounts may vary at other pharmacies due to pharmacy/plan contracts, or as the patient moves through the different stages of their insurance plan.   PA #/Case ID/Reference #: [id number]

## 2024-02-10 ENCOUNTER — Encounter: Payer: Self-pay | Admitting: Internal Medicine

## 2024-02-10 ENCOUNTER — Ambulatory Visit (INDEPENDENT_AMBULATORY_CARE_PROVIDER_SITE_OTHER): Admitting: Internal Medicine

## 2024-02-10 VITALS — BP 120/80 | HR 95 | Temp 97.7°F | Ht 70.0 in | Wt 192.0 lb

## 2024-02-10 DIAGNOSIS — K219 Gastro-esophageal reflux disease without esophagitis: Secondary | ICD-10-CM | POA: Insufficient documentation

## 2024-02-10 MED ORDER — PANTOPRAZOLE SODIUM 40 MG PO TBEC: 40.0000 mg | DELAYED_RELEASE_TABLET | Freq: Every day | ORAL | 3 refills | Status: DC

## 2024-02-10 NOTE — Assessment & Plan Note (Signed)
 Recurrent episode of gastritis and will have him stay on protonix  40 mg daily lifelong. We discussed alternative protocol of 2-4 weeks treatment then initiate early for symptoms but he prefers to prevent episodes.

## 2024-02-10 NOTE — Progress Notes (Signed)
   Subjective:   Patient ID: [name] Brown, male    DOB: 06/29/1951, 73 y.o.   MRN: [id number]  HPI The patient is a 73 YO man coming in for follow up starting protonix  on abdominal pain. Doing much better. This is a multiple episode over lifetime and wonders if he should stay on this.  Review of Systems  Constitutional: Negative.   HENT: Negative.    Eyes: Negative.   Respiratory:  Negative for cough, chest tightness and shortness of breath.   Cardiovascular:  Negative for chest pain, palpitations and leg swelling.  Gastrointestinal:  Negative for abdominal distention, abdominal pain, constipation, diarrhea, nausea and vomiting.  Musculoskeletal: Negative.   Skin: Negative.   Neurological: Negative.   Psychiatric/Behavioral: Negative.      Objective:  Physical Exam Constitutional:      Appearance: He is well-developed.  HENT:     Head: Normocephalic and atraumatic.  Cardiovascular:     Rate and Rhythm: Normal rate and regular rhythm.  Pulmonary:     Effort: Pulmonary effort is normal. No respiratory distress.     Breath sounds: Normal breath sounds. No wheezing or rales.  Abdominal:     General: Bowel sounds are normal. There is no distension.     Palpations: Abdomen is soft.     Tenderness: There is no abdominal tenderness. There is no rebound.  Musculoskeletal:     Cervical back: Normal range of motion.  Skin:    General: Skin is warm and dry.  Neurological:     Mental Status: He is alert and oriented to person, place, and time.     Coordination: Coordination normal.     Vitals:   02/10/24 1052  BP: 120/80  Pulse: 95  Temp: 97.7 F (36.5 C)  TempSrc: Oral  SpO2: 95%  Weight: 192 lb (87.1 kg)  Height: 5\' 10"  (1.778 m)    Assessment & Plan:

## 2024-02-14 ENCOUNTER — Other Ambulatory Visit: Payer: Self-pay | Admitting: Cardiovascular Disease

## 2024-02-14 DIAGNOSIS — I10 Essential (primary) hypertension: Secondary | ICD-10-CM

## 2024-02-15 ENCOUNTER — Encounter (HOSPITAL_COMMUNITY): Payer: Self-pay | Admitting: Internal Medicine

## 2024-02-15 ENCOUNTER — Ambulatory Visit (HOSPITAL_COMMUNITY)
Admission: RE | Admit: 2024-02-15 | Discharge: 2024-02-15 | Disposition: A | Discharge status: Home / Self Care | Payer: PPO | Source: Ambulatory Visit | Attending: Internal Medicine | Admitting: Internal Medicine

## 2024-02-15 ENCOUNTER — Encounter

## 2024-02-15 VITALS — BP 140/60 | HR 63 | Ht 70.0 in | Wt 196.4 lb

## 2024-02-15 VITALS — BP 160/68 | HR 50 | Temp 98.0°F | Resp 16

## 2024-02-15 DIAGNOSIS — Z79899 Other long term (current) drug therapy: Secondary | ICD-10-CM | POA: Diagnosis not present

## 2024-02-15 DIAGNOSIS — D6869 Other thrombophilia: Secondary | ICD-10-CM | POA: Diagnosis not present

## 2024-02-15 DIAGNOSIS — I4819 Other persistent atrial fibrillation: Secondary | ICD-10-CM

## 2024-02-15 DIAGNOSIS — Z006 Encounter for examination for normal comparison and control in clinical research program: Secondary | ICD-10-CM

## 2024-02-15 DIAGNOSIS — Z5181 Encounter for therapeutic drug level monitoring: Secondary | ICD-10-CM | POA: Diagnosis not present

## 2024-02-15 NOTE — Research (Signed)
 Alleviate HF Research Study  Study Exit  No adverse events or cardiovascular medication changes to report at this time.  EQ 5D 5L completed [facility]Q completed [facility] Labs drawn 6 [facility]WT   Current Outpatient Medications:    acetaminophen  (TYLENOL ) 500 MG tablet, Take 1,000 mg by mouth every 4 (four) hours as needed for moderate pain., Disp: , Rfl:    albuterol  (VENTOLIN  HFA) 108 (90 Base) MCG/ACT inhaler, Inhale 2 puffs into the lungs every 6 (six) hours as needed for wheezing or shortness of breath., Disp: 54 g, Rfl: 1   aspirin  EC 81 MG EC tablet, Take 1 tablet (81 mg total) by mouth daily. (Patient taking differently: Take 81 mg by mouth in the morning.), Disp: , Rfl:    Biotin 16109 MCG TABS, Take 10,000 mcg by mouth in the morning., Disp: , Rfl:    cholecalciferol  (VITAMIN D) 25 MCG (1000 UT) tablet, Take 1,000 Units by mouth every evening., Disp: , Rfl:    cyclobenzaprine  (FLEXERIL ) 5 MG tablet, TAKE 1 TABLET(5 MG) BY MOUTH THREE TIMES DAILY AS NEEDED FOR MUSCLE SPASMS, Disp: 30 tablet, Rfl: 0   diclofenac  sodium (VOLTAREN ) 1 % GEL, Apply 2 g topically 4 (four) times daily. (Patient taking differently: Apply 2 g topically 4 (four) times daily as needed (pain).), Disp: 100 g, Rfl: 3   divalproex  (DEPAKOTE  ER) 500 MG 24 hr tablet, Take 1 tablet (500 mg total) by mouth daily., Disp: 90 tablet, Rfl: 3   dofetilide  (TIKOSYN ) 250 MCG capsule, TAKE 1 CAPSULE(250 MCG) BY MOUTH TWICE DAILY, Disp: 60 capsule, Rfl: 3   doxycycline  (VIBRAMYCIN ) 100 MG capsule, Take 100 mg by mouth every evening., Disp: , Rfl:    ezetimibe  (ZETIA ) 10 MG tablet, TAKE 1 TABLET(10 MG) BY MOUTH DAILY, Disp: 90 tablet, Rfl: 3   furosemide  (LASIX ) 20 MG tablet, Take 1 tablet (20 MG) By mouth twice daily. As needed, patient may take 20 MG (1 tablet) additional Lasix  PRN by mouth daily x 4 days as directed per Alleviate Research HF Study PRN plan (Patient taking differently: Take 20 mg by mouth 2 (two) times daily. Take 1 tablet  (20 MG) By mouth twice daily. As needed, patient may take 20 MG (1 tablet) additional Lasix  PRN by mouth daily x 4 days as directed per Alleviate Research HF Study PRN plan), Disp: 180 tablet, Rfl: 3   gabapentin  (NEURONTIN ) 300 MG capsule, TAKE 1 CAPSULE(300 MG) BY MOUTH TWICE DAILY, Disp: 180 capsule, Rfl: 3   Glucosamine-Chondroitin (GLUCOSAMINE CHONDR COMPLEX PO), Take 500 mg by mouth 2 (two) times daily. , Disp: , Rfl:    guaiFENesin  (MUCINEX ) 600 MG 12 hr tablet, Take 600 mg by mouth 2 (two) times daily., Disp: , Rfl:    hydrALAZINE  (APRESOLINE ) 50 MG tablet, Take 1 tablet (50 mg total) by mouth 3 (three) times daily., Disp: 270 tablet, Rfl: 3   isosorbide  mononitrate (IMDUR ) 30 MG 24 hr tablet, TAKE 1 TABLET BY MOUTH EVERY DAY (Patient taking differently: 30 mg in the morning.), Disp: 90 tablet, Rfl: 3   montelukast  (SINGULAIR ) 10 MG tablet, TAKE 1 TABLET(10 MG) BY MOUTH AT BEDTIME, Disp: 90 tablet, Rfl: 3   Multiple Vitamin (MULTIVITAMIN WITH MINERALS) TABS tablet, Take 1 tablet by mouth every evening., Disp: , Rfl:    nitroGLYCERIN  (NITROSTAT ) 0.4 MG SL tablet, DISSOLVE ONE TABLET UNDER TONGUE AS NEEDED FOR CHEST PAIN EVERY 5 MINUTES, Disp: 25 tablet, Rfl: 11   Omega-3 Fatty Acids (FISH OIL) 1200 MG CAPS,  Take 1,200 mg by mouth 2 (two) times daily., Disp: , Rfl:    pantoprazole  (PROTONIX ) 40 MG tablet, Take 1 tablet (40 mg total) by mouth daily., Disp: 90 tablet, Rfl: 3   potassium chloride  (KLOR-CON ) 10 MEQ tablet, As needed patient may take 10 mEq additional Potassium PRN by mouth daily x 4 days as directed per Alleviate Research HF Study PRN plan (Patient taking differently: Take 10 mEq by mouth as needed. As needed patient may take 10 mEq additional Potassium PRN by mouth daily x 4 days as directed per Alleviate Research HF Study PRN plan), Disp: 90 tablet, Rfl: 3   potassium chloride  SA (KLOR-CON  M) 20 MEQ tablet, TAKE 1 TABLET BY MOUTH EVERY DAY, Disp: 90 tablet, Rfl: 0   ramipril   (ALTACE ) 10 MG capsule, TAKE 2 CAPSULES(20 MG) BY MOUTH DAILY, Disp: 180 capsule, Rfl: 3   REPATHA  SURECLICK 140 MG/ML SOAJ, ADMINISTER 1 ML UNDER THE SKIN EVERY 14 DAYS, Disp: 6 mL, Rfl: 3   tamsulosin  (FLOMAX ) 0.4 MG CAPS capsule, TAKE 1 CAPSULE(0.4 MG) BY MOUTH DAILY, Disp: 90 capsule, Rfl: 3   Tetrahydrozoline HCl (VISINE OP), Place 1 drop into both eyes daily as needed (irritation)., Disp: , Rfl:    vitamin C  (ASCORBIC ACID ) 500 MG tablet, Take 500 mg by mouth daily., Disp: , Rfl:    XARELTO  20 MG TABS tablet, TAKE 1 TABLET(20 MG) BY MOUTH DAILY WITH SUPPER, Disp: 90 tablet, Rfl: 1   zolpidem  (AMBIEN  CR) 12.5 MG CR tablet, TAKE 1 TABLET(12.5 MG) BY MOUTH AT BEDTIME, Disp: 90 tablet, Rfl: 1

## 2024-02-15 NOTE — Progress Notes (Signed)
 Primary Care Physician: Adelia Homestead, MD Primary Cardiologist: Dr Stann Earnest Primary Electrophysiologist: Dr Lawana Pray Referring Physician: Dr Dionicia Frater is a 73 y.o. male with a history of coronary artery disease status post three-vessel CABG, hypertension, OSA, hyperlipidemia, and atrial fibrillation.  He was found to be in atrial fibrillation incidentally on office visit 08/30/2017.  He was put on Eliquis  at the time.  Beta-blockers were not started due to resting bradycardia.  January 2020 he was noted increasing dyspnea on exertion.  He had a cardioversion 11/28/18 and was started on amiodarone . He does report today that he was diagnosed with OSA a few years ago but could not tolerate CPAP therapy. He is on Eliquis  for a CHADS2VASC score of 3. Patient is s/p afib ablation 03/19/20. He had recurrent persistent afib and is s/p DCCV on 01/13/21. He initially did well but went back into persistent afib and underwent repeat ablation with Dr Lawana Pray on 10/25/22.  On follow up today, patient has been in persistent afib on his Kardia mobile device. Also in afib when he saw Dr Stann Earnest on 12/08/22. He is scheduled for DCCV on 01/04/23. No bleeding issues on anticoagulation.   On follow up 03/22/23, he is here today for Tikosyn  admission. No recent benadryl  use. No missed doses of Xarelto . No new medications since pharmacist review.   On follow up 04/01/23 he is s/p Tikosyn  admission 6/11-14/24. He did not require cardioversion. Dose reduced due to Qtc lengthening. Monitored until discharge on telemetry which demonstrates Afib > SB. Currently on Tikosyn  250 mcg BID. He notes via Kardiamobile device several possible episodes of Afib since he has been home. No missed doses of Tikosyn  or Xarelto .  On follow up 05/10/23, he is here for 1 month Tikosyn  surveillance. He has had no episodes of Afib since last office visit. He feels noticeably better and has more energy. He is on Tikosyn  250 mcg BID. No  new medications. No missed doses of Tikosyn  or Xarelto .   On follow up 08/12/23, he is here for 3 month Tikosyn  surveillance. He has not had any episodes of Afib since last office visit. He feels well and stays active by walking. He has missed 1 dose of Tikosyn  since last visit.   On follow up 11/16/23, he is here for Tikosyn  surveillance. He is currently in NSR. He has had no episodes of Afib since last office visit. No bleeding issues on Xarelto .   On follow up 02/15/24, he is here for Tikosyn  surveillance. He is currently in NSR. He has had no episodes of Afib since last office visit. No missed doses of Tikosyn  or Xarelto . They are going on an anniversary cruise next week from [place]  to [place]  state.   Today, he denies symptoms of palpitations, chest pain, orthopnea, PND, lower extremity edema, dizziness, presyncope, syncope, bleeding, or neurologic sequela. The patient is tolerating medications without difficulties and is otherwise without complaint today.    Atrial Fibrillation Risk Factors:  he does have symptoms or diagnosis of sleep apnea. he has an Doctor, hospital.  he does not have a history of rheumatic fever. he does not have a history of alcohol use. The patient does not have a history of early familial atrial fibrillation or other arrhythmias.  he has a BMI of Body mass index is 28.18 kg/m.[name] Brown Filed Weights   02/15/24 0905  Weight: 89.1 kg      Family History  Problem Relation Age of Onset   Heart attack  Mother    Heart disease Mother    Heart attack Father    Heart disease Father    Rectal cancer Neg Hx    Stomach cancer Neg Hx    Pancreatic cancer Neg Hx    Esophageal cancer Neg Hx    Colon polyps Neg Hx    Colon cancer Neg Hx     Atrial Fibrillation Management history:  Previous antiarrhythmic drugs: amiodarone , tikosyn  Previous cardioversions: 11/2018, 01/13/21 Previous ablations: 03/19/20, 10/25/22 CHADS2VASC score: 3 Anticoagulation history: Eliquis    Past  Medical History:  Diagnosis Date   Aortic aneurysm [facility]) Endograf 2009   Arthritis    Atrial fibrillation (HCC)    Bradycardia    CAD (coronary artery disease) CABG 03/2000   Median sternotomy for coronary artery bypass grafting x 3 (left   HTN (hypertension)    Hx of CABG 2001   severe 3 vessel disease   Hx of colonic polyp 06/2010   Hyperplastic    Hyperlipidemia    Myocardial infarction [facility])    Sleep apnea    Past Surgical History:  Procedure Laterality Date   ATRIAL FIBRILLATION ABLATION N/A 03/19/2020   Procedure: ATRIAL FIBRILLATION ABLATION;  Surgeon: Lei Pump, MD;  Location: [facility] INVASIVE CV LAB;  Service: Cardiovascular;  Laterality: N/A;   ATRIAL FIBRILLATION ABLATION N/A 10/25/2022   Procedure: ATRIAL FIBRILLATION ABLATION;  Surgeon: Lei Pump, MD;  Location: [facility] INVASIVE CV LAB;  Service: Cardiovascular;  Laterality: N/A;   CARDIOVERSION N/A 11/28/2018   Procedure: CARDIOVERSION;  Surgeon: Euell Herrlich, MD;  Location: [facility] ENDOSCOPY;  Service: Cardiovascular;  Laterality: N/A;   CARDIOVERSION N/A 01/13/2021   Procedure: CARDIOVERSION;  Surgeon: Hazle Lites, MD;  Location: [facility] ENDOSCOPY;  Service: Cardiovascular;  Laterality: N/A;   CARDIOVERSION N/A 01/04/2023   Procedure: CARDIOVERSION;  Surgeon: Sonny Dust, MD;  Location: [facility] ENDOSCOPY;  Service: Cardiovascular;  Laterality: N/A;   COLONOSCOPY     CORONARY ARTERY BYPASS GRAFT     AAA stent graft on 12/01/2007 at [facility]  coronary bypass in 2001. Dr. Alva Jewels.   DRUG INDUCED ENDOSCOPY Right 02/18/2020   Procedure: DRUG INDUCED ENDOSCOPY;  Surgeon: Virgina Grills, MD;  Location: [facility];  Service: ENT;  Laterality: Right;   FOOT SURGERY Left 07/19/2017   IMPLANTATION OF HYPOGLOSSAL NERVE STIMULATOR Right 07/30/2020   Procedure: IMPLANTATION OF HYPOGLOSSAL NERVE STIMULATOR Implantation of Chest wall respirator Sensor Electrode Electronic Analysis of  Implanted Neurostimulator Pulse Generator;  Surgeon: Virgina Grills, MD;  Location: [facility];  Service: ENT;  Laterality: Right;   INGUINAL HERNIA REPAIR Bilateral 08/06/2022   Procedure: LAPAROSCOPIC BILATERAL INGUINAL HERNIA REPAIR WITH MESH AND PRIMARY CLOSURE OF UMBILICAL HERNIA;  Surgeon: Junie Olds, MD;  Location: [facility] ORS;  Service: General;  Laterality: Bilateral;   LEFT HEART CATHETERIZATION WITH CORONARY ANGIOGRAM N/A 10/26/2013   Procedure: LEFT HEART CATHETERIZATION WITH CORONARY ANGIOGRAM;  Surgeon: Peter M Swaziland, MD;  Location: [facility] CATH LAB;  Service: Cardiovascular;  Laterality: N/A;   MASS EXCISION Right 08/10/2019   Procedure: EXCISION OF CYST FROM RIGHT SCALP;  Surgeon: Lillette Reid III, MD;  Location: [facility];  Service: General;  Laterality: Right;    Current Outpatient Medications  Medication Sig Dispense Refill   acetaminophen  (TYLENOL ) 500 MG tablet Take 1,000 mg by mouth every 4 (four) hours as needed for moderate pain.     albuterol  (VENTOLIN  HFA) 108 (90 Base) MCG/ACT inhaler Inhale 2  puffs into the lungs every 6 (six) hours as needed for wheezing or shortness of breath. 54 g 1   aspirin  EC 81 MG EC tablet Take 1 tablet (81 mg total) by mouth daily. (Patient taking differently: Take 81 mg by mouth in the morning.)     Biotin 16109 MCG TABS Take 10,000 mcg by mouth in the morning.     cholecalciferol  (VITAMIN D) 25 MCG (1000 UT) tablet Take 1,000 Units by mouth every evening.     cyclobenzaprine  (FLEXERIL ) 5 MG tablet TAKE 1 TABLET(5 MG) BY MOUTH THREE TIMES DAILY AS NEEDED FOR MUSCLE SPASMS 30 tablet 0   diclofenac  sodium (VOLTAREN ) 1 % GEL Apply 2 g topically 4 (four) times daily. (Patient taking differently: Apply 2 g topically 4 (four) times daily as needed (pain).) 100 g 3   divalproex  (DEPAKOTE  ER) 500 MG 24 hr tablet Take 1 tablet (500 mg total) by mouth daily. 90 tablet 3   dofetilide  (TIKOSYN ) 250 MCG capsule TAKE 1  CAPSULE(250 MCG) BY MOUTH TWICE DAILY 60 capsule 3   doxycycline  (VIBRAMYCIN ) 100 MG capsule Take 100 mg by mouth every evening.     ezetimibe  (ZETIA ) 10 MG tablet TAKE 1 TABLET(10 MG) BY MOUTH DAILY 90 tablet 3   furosemide  (LASIX ) 20 MG tablet Take 1 tablet (20 MG) By mouth twice daily. As needed, patient may take 20 MG (1 tablet) additional Lasix  PRN by mouth daily x 4 days as directed per [employer] HF Study PRN plan (Patient taking differently: Take 20 mg by mouth 2 (two) times daily. Take 1 tablet (20 MG) By mouth twice daily. As needed, patient may take 20 MG (1 tablet) additional Lasix  PRN by mouth daily x 4 days as directed per [employer] HF Study PRN plan) 180 tablet 3   gabapentin  (NEURONTIN ) 300 MG capsule TAKE 1 CAPSULE(300 MG) BY MOUTH TWICE DAILY 180 capsule 3   Glucosamine-Chondroitin (GLUCOSAMINE CHONDR COMPLEX PO) Take 500 mg by mouth 2 (two) times daily.      guaiFENesin  (MUCINEX ) 600 MG 12 hr tablet Take 600 mg by mouth 2 (two) times daily.     hydrALAZINE  (APRESOLINE ) 50 MG tablet Take 1 tablet (50 mg total) by mouth 3 (three) times daily. 270 tablet 3   isosorbide  mononitrate (IMDUR ) 30 MG 24 hr tablet TAKE 1 TABLET BY MOUTH EVERY DAY (Patient taking differently: 30 mg in the morning.) 90 tablet 3   montelukast  (SINGULAIR ) 10 MG tablet TAKE 1 TABLET(10 MG) BY MOUTH AT BEDTIME 90 tablet 3   Multiple Vitamin (MULTIVITAMIN WITH MINERALS) TABS tablet Take 1 tablet by mouth every evening.     nitroGLYCERIN  (NITROSTAT ) 0.4 MG SL tablet DISSOLVE ONE TABLET UNDER TONGUE AS NEEDED FOR CHEST PAIN EVERY 5 MINUTES 25 tablet 11   Omega-3 Fatty Acids (FISH OIL) 1200 MG CAPS Take 1,200 mg by mouth 2 (two) times daily.     pantoprazole  (PROTONIX ) 40 MG tablet Take 1 tablet (40 mg total) by mouth daily. 90 tablet 3   potassium chloride  (KLOR-CON ) 10 MEQ tablet As needed patient may take 10 mEq additional Potassium PRN by mouth daily x 4 days as directed per [employer] HF  Study PRN plan (Patient taking differently: Take 10 mEq by mouth as needed. As needed patient may take 10 mEq additional Potassium PRN by mouth daily x 4 days as directed per [employer] HF Study PRN plan) 90 tablet 3   potassium chloride  SA (KLOR-CON  M) 20 MEQ tablet TAKE 1  TABLET BY MOUTH EVERY DAY 90 tablet 0   ramipril  (ALTACE ) 10 MG capsule TAKE 2 CAPSULES(20 MG) BY MOUTH DAILY 180 capsule 3   REPATHA  SURECLICK 140 MG/ML SOAJ ADMINISTER 1 ML UNDER THE SKIN EVERY 14 DAYS 6 mL 3   tamsulosin  (FLOMAX ) 0.4 MG CAPS capsule TAKE 1 CAPSULE(0.4 MG) BY MOUTH DAILY 90 capsule 3   Tetrahydrozoline HCl (VISINE OP) Place 1 drop into both eyes daily as needed (irritation).     vitamin C  (ASCORBIC ACID ) 500 MG tablet Take 500 mg by mouth daily.     XARELTO  20 MG TABS tablet TAKE 1 TABLET(20 MG) BY MOUTH DAILY WITH SUPPER 90 tablet 1   zolpidem  (AMBIEN  CR) 12.5 MG CR tablet TAKE 1 TABLET(12.5 MG) BY MOUTH AT BEDTIME 90 tablet 1   No current facility-administered medications for this encounter.    Allergies  Allergen Reactions   Statins     Myalgias with elevated CPKs    ROS- All systems are reviewed and negative except as per the HPI above.  Physical Exam: Vitals:   02/15/24 0905  BP: (!) 140/60  Pulse: 63  Weight: 89.1 kg  Height: 5\' 10"  (1.778 m)    GEN- The patient is well appearing, alert and oriented x 3 today.   Neck - no JVD or carotid bruit noted Lungs- Clear to ausculation bilaterally, normal work of breathing Heart- Regular rate and rhythm, no murmurs, rubs or gallops, PMI not laterally displaced Extremities- no clubbing, cyanosis, or edema Skin - no rash or ecchymosis noted   Wt Readings from Last 3 Encounters:  02/15/24 89.1 kg  02/10/24 87.1 kg  01/26/24 88.5 kg    ECG today demonstrates  Vent. rate 63 BPM PR interval 248 ms QRS duration 120 ms QT/QTcB 456/466 ms P-R-T axes 71 -19 27 Sinus rhythm with 1st degree A-V block with occasional Premature  ventricular complexes and Premature atrial complexes Non-specific intra-ventricular conduction delay  Echo 01/21/20 demonstrated   1. Left ventricular ejection fraction, by estimation, is 55 to 60%. The  left ventricle has normal function. Left ventricular endocardial border  not optimally defined to evaluate regional wall motion. There is mild left  ventricular hypertrophy. Left  ventricular diastolic parameters are consistent with Grade II diastolic  dysfunction (pseudonormalization).   2. Right ventricular systolic function is normal. The right ventricular  size is mildly enlarged. There is mildly elevated pulmonary artery  systolic pressure.   3. Left atrial size was moderately dilated.   4. Right atrial size was mildly dilated.   5. The mitral valve is normal in structure. Mild mitral valve  regurgitation.   6. The aortic valve is tricuspid. Aortic valve regurgitation is mild.  Mild aortic valve sclerosis is present, with no evidence of aortic valve  stenosis.   7. The inferior vena cava is normal in size with greater than 50%  respiratory variability, suggesting right atrial pressure of 3 mmHg.   Epic records are reviewed at length today  CHA2DS2-VASc Score = 3  The patient's score is based upon: CHF History: 0 HTN History: 1 Diabetes History: 0 Stroke History: 0 Vascular Disease History: 1 Age Score: 1 Gender Score: 0      ASSESSMENT AND PLAN: 1. Persistent Atrial Fibrillation (ICD10:  I48.19) The patient's CHA2DS2-VASc score is 3, indicating a 3.2% annual risk of stroke.   S/p afib ablation 03/19/20 with repeat ablation 10/25/22. S/p Tikosyn  admission 6/11-14/24.  He is currently in NSR.    High risk medication  monitoring (ICD10: [id number]) Patient requires ongoing monitoring for anti-arrhythmic medication which has the potential to cause life threatening arrhythmias or AV block. Qtc stable. Continue Tikosyn  250 mcg BID. Bmet and mag drawn today.  When he is in  atrial fibrillation, he complains of shortness of breath, at the level of NYHA class II.  2. Secondary Hypercoagulable State (ICD10:  D68.69) The patient is at significant risk for stroke/thromboembolism based upon his CHA2DS2-VASc Score of 3.  Continue Xarelto  No missed doses.   3. HTN Stable today.  4. Obstructive sleep apnea He has Inspire device.   5. CAD s/p CABG No chest pain today.      F/u 6 months for Tikosyn  surveillance.     Minnie Amber, PA-C [facility] [address] [place], [place] [address] [phone] 02/15/2024 9:24 AM

## 2024-02-16 LAB — BASIC METABOLIC PANEL WITH GFR
BUN/Creatinine Ratio: 23 (ref 10–24)
BUN: 25 mg/dL (ref 8–27)
CO2: 21 mmol/L (ref 20–29)
Calcium: 9.4 mg/dL (ref 8.6–10.2)
Chloride: 103 mmol/L (ref 96–106)
Creatinine, Ser: 1.11 mg/dL (ref 0.76–1.27)
Glucose: 93 mg/dL (ref 70–99)
Potassium: 4.6 mmol/L (ref 3.5–5.2)
Sodium: 142 mmol/L (ref 134–144)
eGFR: 71 mL/min/{1.73_m2} (ref >59)

## 2024-02-16 LAB — MAGNESIUM: Magnesium: 2.2 mg/dL (ref 1.6–2.3)

## 2024-02-17 LAB — BASIC METABOLIC PANEL WITH GFR
BUN/Creatinine Ratio: 23 (ref 10–24)
BUN: 25 mg/dL (ref 8–27)
CO2: 20 mmol/L (ref 20–29)
Calcium: 9 mg/dL (ref 8.6–10.2)
Chloride: 103 mmol/L (ref 96–106)
Creatinine, Ser: 1.1 mg/dL (ref 0.76–1.27)
Glucose: 92 mg/dL (ref 70–99)
Potassium: 4.2 mmol/L (ref 3.5–5.2)
Sodium: 138 mmol/L (ref 134–144)
eGFR: 71 mL/min/{1.73_m2} (ref >59)

## 2024-02-17 LAB — CBC
Hematocrit: 35.6 % — ABNORMAL LOW (ref 37.5–51.0)
Hemoglobin: 12.3 g/dL — ABNORMAL LOW (ref 13.0–17.7)
MCH: 34.3 pg — ABNORMAL HIGH (ref 26.6–33.0)
MCHC: 34.6 g/dL (ref 31.5–35.7)
MCV: 99 fL — ABNORMAL HIGH (ref 79–97)
Platelets: 214 10*3/uL (ref 150–450)
RBC: 3.59 x10E6/uL — ABNORMAL LOW (ref 4.14–5.80)
RDW: 13.1 % (ref 11.6–15.4)
WBC: 6.3 10*3/uL (ref 3.4–10.8)

## 2024-02-17 LAB — BRAIN NATRIURETIC PEPTIDE: BNP: 151.4 pg/mL — ABNORMAL HIGH (ref 0.0–100.0)

## 2024-03-12 ENCOUNTER — Other Ambulatory Visit: Payer: Self-pay | Admitting: Cardiovascular Disease

## 2024-03-12 DIAGNOSIS — Z006 Encounter for examination for normal comparison and control in clinical research program: Secondary | ICD-10-CM

## 2024-03-12 NOTE — Telephone Encounter (Signed)
This is a A-Fib clinic pt 

## 2024-03-13 MED ORDER — FUROSEMIDE 20 MG PO TABS: ORAL_TABLET | ORAL | 3 refills | Status: AC

## 2024-04-03 ENCOUNTER — Encounter: Payer: Self-pay | Admitting: Cardiology

## 2024-04-03 ENCOUNTER — Ambulatory Visit: Attending: Cardiology | Admitting: Cardiology

## 2024-04-03 VITALS — BP 142/72 | HR 58 | Ht 70.0 in | Wt 193.0 lb

## 2024-04-03 DIAGNOSIS — G4733 Obstructive sleep apnea (adult) (pediatric): Secondary | ICD-10-CM | POA: Diagnosis not present

## 2024-04-03 DIAGNOSIS — I10 Essential (primary) hypertension: Secondary | ICD-10-CM

## 2024-04-03 NOTE — Patient Instructions (Signed)
 Medication Instructions:  Your physician recommends that you continue on your current medications as directed. Please refer to the Current Medication list given to you today.  *If you need a refill on your cardiac medications before your next appointment, please call your pharmacy*  Lab Work: NONE If you have labs (blood work) drawn today and your tests are completely normal, you will receive your results only by: MyChart Message (if you have MyChart) OR A paper copy in the mail If you have any lab test that is abnormal or we need to change your treatment, we will call you to review the results.  Testing/Procedures: NONE  Follow-Up: At [facility], you and your health needs are our priority.  As part of our continuing mission to provide you with exceptional heart care, our providers are all part of one team.  This team includes your primary Cardiologist (physician) and Advanced Practice Providers or APPs (Physician Assistants and Nurse Practitioners) who all work together to provide you with the care you need, when you need it.  Your next appointment:   Virtual sleep visit in 4 weeks  Provider:   Shlomo, MD  We recommend signing up for the patient portal called MyChart.  Sign up information is provided on this After Visit Summary.  MyChart is used to connect with patients for Virtual Visits (Telemedicine).  Patients are able to view lab/test results, encounter notes, upcoming appointments, etc.  Non-urgent messages can be sent to your provider as well.   To learn more about what you can do with MyChart, go to [email or web address].

## 2024-04-03 NOTE — Progress Notes (Signed)
 Sleep Office Note   Date:  04/03/2024   ID:  STOKES RATTIGAN, DOB 1951/10/10, MRN [id number] The patient was identified using 2 identifiers.  PCP:  Rollene Almarie LABOR, MD   [facility] Providers Cardiologist:  Maude Emmer, MD Electrophysiologist:  Will Gladis Norton, MD  Sleep Medicine:  Wilbert Bihari, MD     Evaluation Performed:  Follow-Up Visit  Chief Complaint:  OSA  History of Present Illness:    [name] Brown is a 73 y.o. male with  with a hx of afib, SSS, CAD.  He has a hx of OSA with home sleep study documenting this in 2018.  He was placed on CPAP but at that time did not tolerate CPAP therapy.  He was using a nasal mask or nasal pillow mask but it bothered him so much he could not use it.  He was referred to Dr. Micky for an oral device but apparently needed implants put in because he does not have enough teeth but it was cost prohibitive.    He decided to retry CPAP and underwent CPAP tiration in 08/2019.  Due to ongoing respiratory events he could not be adequately titrated on CPAP and was changed to BiPAP.  He again could not tolerate BiPAP.   Due to problems with excessive daytime sleepiness he was referred to ENT to be evaluated for left hypoglossal nerve stimulator.     He underwent Hypoglossal N stimulator placement 07/30/2020 and underwent device activation. He  was seen back in office and was doing well with his device and was at level 6.  He underwent titration in the sleep lab but was inadequate due to ongoing respiratory events.  He was seen back in clinic 4 weeks ago and his functional threshold had decreased to 1.3V likely related to healing around the nerve and his range was changed to 1.7-2.7.     At last office visit he was doing well with his device and using it nightly. He was on level 6-7 which was 2.3V.  He was supposed to have a home sleep study but when he did the sleep study both times he had difficulty maneuvering the device and therefore the  sleep study was not performed.   At last office visit he was doing well and was sleeping on average 5.6 hours a day.  He felt rested in the morning and did not really have any significant daytime sleepiness.  He was on level 8 which is 2.4 V.  At that visit his stimulation level was changed to a rate of 33 Hz with a pulse width of 120 s and maximum stim duration 4 seconds.  Final amplitude was 2.6 V with the patient control range of 2.2 to 2.6 V.  Start delay was set at 60 minutes and pause time 30 minutes.    He called in stating that he was having problems with his device.  He is now here for follow-up. Past Medical History:  Diagnosis Date   Aortic aneurysm [facility]) Endograf 2009   Arthritis    Atrial fibrillation (HCC)    Bradycardia    CAD (coronary artery disease) CABG 03/2000   Median sternotomy for coronary artery bypass grafting x 3 (left   HTN (hypertension)    Hx of CABG 2001   severe 3 vessel disease   Hx of colonic polyp 06/2010   Hyperplastic    Hyperlipidemia    Myocardial infarction [facility])    Sleep apnea  Past Surgical History:  Procedure Laterality Date   ATRIAL FIBRILLATION ABLATION N/A 03/19/2020   Procedure: ATRIAL FIBRILLATION ABLATION;  Surgeon: Inocencio Soyla Lunger, MD;  Location: [facility] INVASIVE CV LAB;  Service: Cardiovascular;  Laterality: N/A;   ATRIAL FIBRILLATION ABLATION N/A 10/25/2022   Procedure: ATRIAL FIBRILLATION ABLATION;  Surgeon: Inocencio Soyla Lunger, MD;  Location: [facility] INVASIVE CV LAB;  Service: Cardiovascular;  Laterality: N/A;   CARDIOVERSION N/A 11/28/2018   Procedure: CARDIOVERSION;  Surgeon: Loni Soyla LABOR, MD;  Location: [facility] ENDOSCOPY;  Service: Cardiovascular;  Laterality: N/A;   CARDIOVERSION N/A 01/13/2021   Procedure: CARDIOVERSION;  Surgeon: Mona Vinie BROCKS, MD;  Location: [facility] ENDOSCOPY;  Service: Cardiovascular;  Laterality: N/A;   CARDIOVERSION N/A 01/04/2023   Procedure: CARDIOVERSION;  Surgeon: Hobart Powell BRAVO, MD;  Location: [facility]  ENDOSCOPY;  Service: Cardiovascular;  Laterality: N/A;   COLONOSCOPY     CORONARY ARTERY BYPASS GRAFT     AAA stent graft on 12/01/2007 at [facility]  coronary bypass in 2001. Dr. Dusty.   DRUG INDUCED ENDOSCOPY Right 02/18/2020   Procedure: DRUG INDUCED ENDOSCOPY;  Surgeon: Carlie Clark, MD;  Location: [facility];  Service: ENT;  Laterality: Right;   FOOT SURGERY Left 07/19/2017   IMPLANTATION OF HYPOGLOSSAL NERVE STIMULATOR Right 07/30/2020   Procedure: IMPLANTATION OF HYPOGLOSSAL NERVE STIMULATOR Implantation of Chest wall respirator Sensor Electrode Electronic Analysis of Implanted Neurostimulator Pulse Generator;  Surgeon: Carlie Clark, MD;  Location: [facility];  Service: ENT;  Laterality: Right;   INGUINAL HERNIA REPAIR Bilateral 08/06/2022   Procedure: LAPAROSCOPIC BILATERAL INGUINAL HERNIA REPAIR WITH MESH AND PRIMARY CLOSURE OF UMBILICAL HERNIA;  Surgeon: Lyndel Deward PARAS, MD;  Location: [facility] ORS;  Service: General;  Laterality: Bilateral;   LEFT HEART CATHETERIZATION WITH CORONARY ANGIOGRAM N/A 10/26/2013   Procedure: LEFT HEART CATHETERIZATION WITH CORONARY ANGIOGRAM;  Surgeon: Peter M Swaziland, MD;  Location: [facility] CATH LAB;  Service: Cardiovascular;  Laterality: N/A;   MASS EXCISION Right 08/10/2019   Procedure: EXCISION OF CYST FROM RIGHT SCALP;  Surgeon: Curvin Deward III, MD;  Location: [facility];  Service: General;  Laterality: Right;     Current Meds  Medication Sig   acetaminophen  (TYLENOL ) 500 MG tablet Take 1,000 mg by mouth every 4 (four) hours as needed for moderate pain.   albuterol  (VENTOLIN  HFA) 108 (90 Base) MCG/ACT inhaler Inhale 2 puffs into the lungs every 6 (six) hours as needed for wheezing or shortness of breath.   aspirin  EC 81 MG EC tablet Take 1 tablet (81 mg total) by mouth daily. (Patient taking differently: Take 81 mg by mouth in the morning.)   Biotin 89999 MCG TABS Take 10,000 mcg by mouth in  the morning.   cholecalciferol  (VITAMIN D) 25 MCG (1000 UT) tablet Take 1,000 Units by mouth every evening.   cyclobenzaprine  (FLEXERIL ) 5 MG tablet TAKE 1 TABLET(5 MG) BY MOUTH THREE TIMES DAILY AS NEEDED FOR MUSCLE SPASMS   diclofenac  sodium (VOLTAREN ) 1 % GEL Apply 2 g topically 4 (four) times daily. (Patient taking differently: Apply 2 g topically 4 (four) times daily as needed (pain).)   divalproex  (DEPAKOTE  ER) 500 MG 24 hr tablet Take 1 tablet (500 mg total) by mouth daily.   dofetilide  (TIKOSYN ) 250 MCG capsule TAKE 1 CAPSULE(250 MCG) BY MOUTH TWICE DAILY   doxycycline  (VIBRAMYCIN ) 100 MG capsule Take 100 mg by mouth every evening.   ezetimibe  (ZETIA ) 10 MG tablet TAKE 1 TABLET(10 MG) BY MOUTH  DAILY   furosemide  (LASIX ) 20 MG tablet Take 1 tablet (20 MG) By mouth twice daily. As needed, patient may take 20 MG (1 tablet) additional Lasix  PRN by mouth daily x 4 days as directed per [employer] HF Study PRN plan   gabapentin  (NEURONTIN ) 300 MG capsule TAKE 1 CAPSULE(300 MG) BY MOUTH TWICE DAILY   Glucosamine-Chondroitin (GLUCOSAMINE CHONDR COMPLEX PO) Take 500 mg by mouth 2 (two) times daily.    guaiFENesin  (MUCINEX ) 600 MG 12 hr tablet Take 600 mg by mouth 2 (two) times daily.   hydrALAZINE  (APRESOLINE ) 50 MG tablet Take 1 tablet (50 mg total) by mouth 3 (three) times daily.   isosorbide  mononitrate (IMDUR ) 30 MG 24 hr tablet TAKE 1 TABLET BY MOUTH EVERY DAY (Patient taking differently: 30 mg in the morning.)   montelukast  (SINGULAIR ) 10 MG tablet TAKE 1 TABLET(10 MG) BY MOUTH AT BEDTIME   Multiple Vitamin (MULTIVITAMIN WITH MINERALS) TABS tablet Take 1 tablet by mouth every evening.   nitroGLYCERIN  (NITROSTAT ) 0.4 MG SL tablet DISSOLVE ONE TABLET UNDER TONGUE AS NEEDED FOR CHEST PAIN EVERY 5 MINUTES   Omega-3 Fatty Acids (FISH OIL) 1200 MG CAPS Take 1,200 mg by mouth 2 (two) times daily.   pantoprazole  (PROTONIX ) 40 MG tablet Take 1 tablet (40 mg total) by mouth daily.   potassium  chloride SA (KLOR-CON  M) 20 MEQ tablet TAKE 1 TABLET BY MOUTH EVERY DAY   ramipril  (ALTACE ) 10 MG capsule TAKE 2 CAPSULES(20 MG) BY MOUTH DAILY   REPATHA  SURECLICK 140 MG/ML SOAJ ADMINISTER 1 ML UNDER THE SKIN EVERY 14 DAYS   tamsulosin  (FLOMAX ) 0.4 MG CAPS capsule TAKE 1 CAPSULE(0.4 MG) BY MOUTH DAILY   Tetrahydrozoline HCl (VISINE OP) Place 1 drop into both eyes daily as needed (irritation).   vitamin C  (ASCORBIC ACID ) 500 MG tablet Take 500 mg by mouth daily.   XARELTO  20 MG TABS tablet TAKE 1 TABLET(20 MG) BY MOUTH DAILY WITH SUPPER   zolpidem  (AMBIEN  CR) 12.5 MG CR tablet TAKE 1 TABLET(12.5 MG) BY MOUTH AT BEDTIME     Allergies:   Statins   Social History   Tobacco Use   Smoking status: Former    Current packs/day: 0.00    Average packs/day: 2.0 packs/day for 30.0 years (60.0 ttl pk-yrs)    Types: Cigarettes    Start date: 01/04/1962    Quit date: 01/05/1992    Years since quitting: 32.2   Smokeless tobacco: Never   Tobacco comments:    Former smoker quit 19 yrs ago 11/22/22  Vaping Use   Vaping status: Never Used  Substance Use Topics   Alcohol use: No   Drug use: Yes    Types: Marijuana     Family Hx: The patient's family history includes Heart attack in his father and mother; Heart disease in his father and mother. There is no history of Rectal cancer, Stomach cancer, Pancreatic cancer, Esophageal cancer, Colon polyps, or Colon cancer.  ROS:   Please see the history of present illness.     All other systems reviewed and are negative.   Prior CV studies:   The following studies were reviewed today:  none  Labs/Other Tests and Data Reviewed:    EKG:  No ECG reviewed.  Recent Labs: 08/12/2023: NT-Pro BNP 444 01/26/2024: ALT 13 02/15/2024: BNP 151.4; BUN 25; Creatinine, Ser 1.10; Hemoglobin 12.3; Magnesium 2.2; Platelets 214; Potassium 4.2; Sodium 138   Recent Lipid Panel Lab Results  Component Value Date/Time   CHOL 113  11/18/2023 08:01 AM   TRIG 90  11/18/2023 08:01 AM   HDL 36 (L) 11/18/2023 08:01 AM   CHOLHDL 3.1 11/18/2023 08:01 AM   CHOLHDL 4.8 11/29/2018 02:23 AM   LDLCALC 59 11/18/2023 08:01 AM    Wt Readings from Last 3 Encounters:  04/03/24 193 lb (87.5 kg)  02/15/24 196 lb 6.4 oz (89.1 kg)  02/10/24 192 lb (87.1 kg)     Risk Assessment/Calculations:    CHA2DS2-VASc Score = 3  This indicates a 3.2% annual risk of stroke. The patient's score is based upon: CHF History: 0 HTN History: 1 Diabetes History: 0 Stroke History: 0 Vascular Disease History: 1 Age Score: 1 Gender Score: 0      Objective:    Vital Signs:  BP (!) 142/72   Pulse (!) 58   Ht 5' 10 (1.778 m)   Wt 193 lb (87.5 kg)   SpO2 95%   BMI 27.69 kg/m   Well nourished, well developed male in no acute distress. Well appearing, alert and conversant, regular work of breathing,  good skin color  Eyes- anicteric mouth- oral mucosa is pink  neuro- grossly intact skin- no apparent rash or lesions or cyanosis ASSESSMENT & PLAN:    1.  OSA -he has had a sleep study in 2018 and was tried on CPAP but he did not tolerate the PAP both in 2018 and on retry in 2021. -the oral device was cost prohibitive due to needing major dental work prior to getting the device so that is not an option -now s/p hypoglossal N stimulator -Initial parameters were set as follows:             Amplitude 2.3V             Patient control 2.3-3.3V             Pulse width             Rate 33Hz              Start delay 30 min             Pause time 15 min             Therapy duration 8 hours             Electrodes +/-/+ -he had a titration in sleep lab done in March 2022 and titration was inadequate due to ongoing respiratory events at peak Voltage of 3.6V.   -his device was interrogated in office after sleep study and his functional threshold had significantly decreased to 1.3V likely related to healing around the nerve where the lead was implanted and the device was  reprogrammed to a range of 1.7 to 2.7V -At last office visit he was doing well with his device and was on level 8 which is 2.4 V and his range was changed to 2.2 to 2.7 V. -He underwent an [facility] home sleep study which showed moderate obstructive sleep apnea with an AHI of 22.3/h and mild central sleep apnea with 10.1% Cheyne-Stokes respirations. -He was brought back to the lab for an attempt at repeat inspire titration but due to ongoing respiratory events this was unsuccessful. -After detailed review of his last [facility] sleep study it was apparent that during REM sleep he actually had no significant events but in later stages of sleep with more frequent awakenings and leg movements he seemed to have more events. -His device was reinterrogated:             -  Sensing level was 2.3 V and functional level was 2.3 V with comfortable tongue movement             -Stimulation level was changed to a rate of 33 Hz with a pulse width of 120 s and a maximum stim duration of 4 seconds             -Final amplitude 2.6 V with patient control from 2.2 to 2.6 V             -Start delay At 60 minutes and pause time 30 minutes             -Therapy duration kept at 8 hours -Office visit today he presents because he has been slowly having more discomfort with his tongue.  He was initially on level 3 (2.3 V) but slowly had to go down to level 1 because of tongue discomfort.  Then ultimately had to stop using the inspire device because he could not tolerate the voltage.  After interrogation today it was felt that he likely had a threshold change and needed to be at a lower output. Functional testing showed a functional voltage of 1.5 so his range was changed to 1.5 to 2.1 V Pulse width was also changed to 60 with a rate of 40 He will start at level 2 (1.6 V) and slowly increase as tolerated to goal of level 4 (1.8 V) We will plan a 4-week virtual visit to see how he is doing on the settings and then schedule a home sleep  study if there are no issues with his device If home sleep study shows a good AHI then we will have follow-up with inspire rep in 6 months  2.  HTN -BP has been controlled at home -Continue prescription drug management with hydralazine  25 mg twice daily, Imdur  30 mg daily and Altace  20 mg daily with as needed refills     Medication Adjustments/Labs and Tests Ordered: Current medicines are reviewed at length with the patient today.  Concerns regarding medicines are outlined above.   Tests Ordered: No orders of the defined types were placed in this encounter.   Medication Changes: No orders of the defined types were placed in this encounter.   Follow Up:  In Person in 6 month(s)  Signed, Wilbert Bihari, MD  04/03/2024 10:30 AM    [facility]

## 2024-04-09 DIAGNOSIS — H31003 Unspecified chorioretinal scars, bilateral: Secondary | ICD-10-CM | POA: Diagnosis not present

## 2024-04-09 DIAGNOSIS — H2513 Age-related nuclear cataract, bilateral: Secondary | ICD-10-CM | POA: Diagnosis not present

## 2024-04-09 DIAGNOSIS — H5203 Hypermetropia, bilateral: Secondary | ICD-10-CM | POA: Diagnosis not present

## 2024-04-15 ENCOUNTER — Other Ambulatory Visit (HOSPITAL_COMMUNITY): Payer: Self-pay | Admitting: Internal Medicine

## 2024-04-18 ENCOUNTER — Other Ambulatory Visit: Payer: Self-pay | Admitting: Cardiovascular Disease

## 2024-05-02 ENCOUNTER — Ambulatory Visit (INDEPENDENT_AMBULATORY_CARE_PROVIDER_SITE_OTHER): Payer: PPO

## 2024-05-02 VITALS — Ht 70.0 in | Wt 193.0 lb

## 2024-05-02 DIAGNOSIS — Z0000 Encounter for general adult medical examination without abnormal findings: Secondary | ICD-10-CM

## 2024-05-02 NOTE — Patient Instructions (Signed)
 Mr. Anschutz , Thank you for taking time out of your busy schedule to complete your Annual Wellness Visit with me. I enjoyed our conversation and look forward to speaking with you again next year. I, as well as your care team,  appreciate your ongoing commitment to your health goals. Please review the following plan we discussed and let me know if I can assist you in the future. Your Game plan/ To Do List    Follow up Visits: Next Medicare AWV with our clinical staff: 05/10/2025 - at the office   Have you seen your provider in the last 6 months (3 months if uncontrolled diabetes)? No Next Office Visit with your provider: 06/01/2024 -Physical  Clinician Recommendations:  Aim for 30 minutes of exercise or brisk walking, 6-8 glasses of water, and 5 servings of fruits and vegetables each day. Educated and advised on getting the Tdap vaccine in 2025 at local pharmacy.      This is a list of the screening recommended for you and due dates:  Health Maintenance  Topic Date Due   COVID-19 Vaccine (3 - Moderna risk series) 08/03/2022   DTaP/Tdap/Td vaccine (2 - Td or Tdap) 10/11/2022   Flu Shot  05/11/2024   Medicare Annual Wellness Visit  05/02/2025   Colon Cancer Screening  07/20/2026   Pneumococcal Vaccine for age over 77  Completed   Hepatitis C Screening  Completed   Zoster (Shingles) Vaccine  Completed   Hepatitis B Vaccine  Aged Out   HPV Vaccine  Aged Out   Meningitis B Vaccine  Aged Out    Advanced directives: (In Chart) A copy of your advanced directives are scanned into your chart should your provider ever need it. Advance Care Planning is important because it:  [x]  Makes sure you receive the medical care that is consistent with your values, goals, and preferences  [x]  It provides guidance to your family and loved ones and reduces their decisional burden about whether or not they are making the right decisions based on your wishes.  Follow the link provided in your after visit  summary or read over the paperwork we have mailed to you to help you started getting your Advance Directives in place. If you need assistance in completing these, please reach out to us  so that we can help you!

## 2024-05-02 NOTE — Progress Notes (Signed)
 Subjective:  Please attest and cosign this visit due to patients primary care provider not being in the office at the time the visit was completed.  (Pt of Dr Almarie Cleveland)   [name] Brown is a 73 y.o. who presents for a Medicare Wellness preventive visit.  As a reminder, Annual Wellness Visits don't include a physical exam, and some assessments may be limited, especially if this visit is performed virtually. We may recommend an in-person follow-up visit with your provider if needed.  Visit Complete: Virtual I connected with  [name] Brown on 05/02/24 by a audio enabled telemedicine application and verified that I am speaking with the correct person using two identifiers.  Patient Location: Home  Provider Location: Office/Clinic  I discussed the limitations of evaluation and management by telemedicine. The patient expressed understanding and agreed to proceed.  Vital Signs: Because this visit was a virtual/telehealth visit, some criteria may be missing or patient reported. Any vitals not documented were not able to be obtained and vitals that have been documented are patient reported.  VideoDeclined- This patient declined Librarian, academic. Therefore the visit was completed with audio only.  Persons Participating in Visit: Patient.  AWV Questionnaire: No: Patient Medicare AWV questionnaire was not completed prior to this visit.  Cardiac Risk Factors include: advanced age (>75men, >31 women);hypertension;male gender     Objective:    Today's Vitals   05/02/24 0812  Weight: 193 lb (87.5 kg)  Height: 5' 10 (1.778 m)   Body mass index is 27.69 kg/m.     05/02/2024    8:12 AM 05/02/2023    8:48 AM 03/23/2023    3:00 PM 01/04/2023    7:51 AM 11/30/2022    8:26 PM 10/25/2022    6:20 AM 08/06/2022   12:26 PM  Advanced Directives  Does Patient Have a Medical Advance Directive? Yes Yes No No Yes Yes Yes  Type of Engineer, mining of [place];Living will Healthcare Power of [place];Living will   Healthcare Power of [place];Living will Healthcare Power of [place];Living will Healthcare Power of [place];Living will  Does patient want to make changes to medical advance directive? No - Patient declined No - Patient declined   No - Patient declined No - Patient declined No - Patient declined  Copy of Healthcare Power of Attorney in Chart? Yes - validated most recent copy scanned in chart (See row information) Yes - validated most recent copy scanned in chart (See row information)   No - copy requested Yes - validated most recent copy scanned in chart (See row information) Yes - validated most recent copy scanned in chart (See row information)  Would patient like information on creating a medical advance directive?   No - Patient declined        Current Medications (verified) Outpatient Encounter Medications as of 05/02/2024  Medication Sig   acetaminophen  (TYLENOL ) 500 MG tablet Take 1,000 mg by mouth every 4 (four) hours as needed for moderate pain.   albuterol  (VENTOLIN  HFA) 108 (90 Base) MCG/ACT inhaler Inhale 2 puffs into the lungs every 6 (six) hours as needed for wheezing or shortness of breath.   aspirin  EC 81 MG EC tablet Take 1 tablet (81 mg total) by mouth daily.   Biotin 89999 MCG TABS Take 10,000 mcg by mouth in the morning.   cholecalciferol  (VITAMIN D) 25 MCG (1000 UT) tablet Take 1,000 Units by mouth every evening.   cyclobenzaprine  (FLEXERIL ) 5 MG tablet TAKE  1 TABLET(5 MG) BY MOUTH THREE TIMES DAILY AS NEEDED FOR MUSCLE SPASMS   diclofenac  sodium (VOLTAREN ) 1 % GEL Apply 2 g topically 4 (four) times daily.   divalproex  (DEPAKOTE  ER) 500 MG 24 hr tablet Take 1 tablet (500 mg total) by mouth daily.   dofetilide  (TIKOSYN ) 250 MCG capsule TAKE 1 CAPSULE(250 MCG) BY MOUTH TWICE DAILY   doxycycline  (VIBRAMYCIN ) 100 MG capsule Take 100 mg by mouth every evening.   ezetimibe  (ZETIA ) 10 MG tablet TAKE 1  TABLET(10 MG) BY MOUTH DAILY   furosemide  (LASIX ) 20 MG tablet Take 1 tablet (20 MG) By mouth twice daily. As needed, patient may take 20 MG (1 tablet) additional Lasix  PRN by mouth daily x 4 days as directed per [employer] HF Study PRN plan   gabapentin  (NEURONTIN ) 300 MG capsule TAKE 1 CAPSULE(300 MG) BY MOUTH TWICE DAILY   Glucosamine-Chondroitin (GLUCOSAMINE CHONDR COMPLEX PO) Take 500 mg by mouth 2 (two) times daily.    guaiFENesin  (MUCINEX ) 600 MG 12 hr tablet Take 600 mg by mouth 2 (two) times daily.   hydrALAZINE  (APRESOLINE ) 50 MG tablet Take 1 tablet (50 mg total) by mouth 3 (three) times daily.   isosorbide  mononitrate (IMDUR ) 30 MG 24 hr tablet TAKE 1 TABLET BY MOUTH EVERY DAY   montelukast  (SINGULAIR ) 10 MG tablet TAKE 1 TABLET(10 MG) BY MOUTH AT BEDTIME   Multiple Vitamin (MULTIVITAMIN WITH MINERALS) TABS tablet Take 1 tablet by mouth every evening.   nitroGLYCERIN  (NITROSTAT ) 0.4 MG SL tablet DISSOLVE ONE TABLET UNDER TONGUE AS NEEDED FOR CHEST PAIN EVERY 5 MINUTES   Omega-3 Fatty Acids (FISH OIL) 1200 MG CAPS Take 1,200 mg by mouth 2 (two) times daily.   pantoprazole  (PROTONIX ) 40 MG tablet Take 1 tablet (40 mg total) by mouth daily.   potassium chloride  SA (KLOR-CON  M) 20 MEQ tablet TAKE 1 TABLET BY MOUTH EVERY DAY   ramipril  (ALTACE ) 10 MG capsule TAKE 2 CAPSULES(20 MG) BY MOUTH DAILY   REPATHA  SURECLICK 140 MG/ML SOAJ ADMINISTER 1 ML UNDER THE SKIN EVERY 14 DAYS   tamsulosin  (FLOMAX ) 0.4 MG CAPS capsule TAKE 1 CAPSULE(0.4 MG) BY MOUTH DAILY   Tetrahydrozoline HCl (VISINE OP) Place 1 drop into both eyes daily as needed (irritation).   vitamin C  (ASCORBIC ACID ) 500 MG tablet Take 500 mg by mouth daily.   XARELTO  20 MG TABS tablet TAKE 1 TABLET(20 MG) BY MOUTH DAILY WITH SUPPER   zolpidem  (AMBIEN  CR) 12.5 MG CR tablet TAKE 1 TABLET(12.5 MG) BY MOUTH AT BEDTIME   No facility-administered encounter medications on file as of 05/02/2024.    Allergies (verified) Statins    History: Past Medical History:  Diagnosis Date   Aortic aneurysm [facility]) Endograf 2009   Arthritis    Atrial fibrillation (HCC)    Bradycardia    CAD (coronary artery disease) CABG 03/2000   Median sternotomy for coronary artery bypass grafting x 3 (left   HTN (hypertension)    Hx of CABG 2001   severe 3 vessel disease   Hx of colonic polyp 06/2010   Hyperplastic    Hyperlipidemia    Myocardial infarction [facility])    Sleep apnea    Past Surgical History:  Procedure Laterality Date   ATRIAL FIBRILLATION ABLATION N/A 03/19/2020   Procedure: ATRIAL FIBRILLATION ABLATION;  Surgeon: Inocencio Soyla Lunger, MD;  Location: [facility] INVASIVE CV LAB;  Service: Cardiovascular;  Laterality: N/A;   ATRIAL FIBRILLATION ABLATION N/A 10/25/2022   Procedure: ATRIAL FIBRILLATION ABLATION;  Surgeon:  Inocencio Soyla Lunger, MD;  Location: [facility] INVASIVE CV LAB;  Service: Cardiovascular;  Laterality: N/A;   CARDIOVERSION N/A 11/28/2018   Procedure: CARDIOVERSION;  Surgeon: Loni Soyla LABOR, MD;  Location: [facility] ENDOSCOPY;  Service: Cardiovascular;  Laterality: N/A;   CARDIOVERSION N/A 01/13/2021   Procedure: CARDIOVERSION;  Surgeon: Mona Vinie BROCKS, MD;  Location: [facility] ENDOSCOPY;  Service: Cardiovascular;  Laterality: N/A;   CARDIOVERSION N/A 01/04/2023   Procedure: CARDIOVERSION;  Surgeon: Hobart Powell BRAVO, MD;  Location: [facility] ENDOSCOPY;  Service: Cardiovascular;  Laterality: N/A;   COLONOSCOPY     CORONARY ARTERY BYPASS GRAFT     AAA stent graft on 12/01/2007 at [facility]  coronary bypass in 2001. Dr. Dusty.   DRUG INDUCED ENDOSCOPY Right 02/18/2020   Procedure: DRUG INDUCED ENDOSCOPY;  Surgeon: Carlie Clark, MD;  Location: [facility];  Service: ENT;  Laterality: Right;   FOOT SURGERY Left 07/19/2017   IMPLANTATION OF HYPOGLOSSAL NERVE STIMULATOR Right 07/30/2020   Procedure: IMPLANTATION OF HYPOGLOSSAL NERVE STIMULATOR Implantation of Chest wall respirator Sensor Electrode Electronic  Analysis of Implanted Neurostimulator Pulse Generator;  Surgeon: Carlie Clark, MD;  Location: [facility];  Service: ENT;  Laterality: Right;   INGUINAL HERNIA REPAIR Bilateral 08/06/2022   Procedure: LAPAROSCOPIC BILATERAL INGUINAL HERNIA REPAIR WITH MESH AND PRIMARY CLOSURE OF UMBILICAL HERNIA;  Surgeon: Lyndel Deward PARAS, MD;  Location: [facility] ORS;  Service: General;  Laterality: Bilateral;   LEFT HEART CATHETERIZATION WITH CORONARY ANGIOGRAM N/A 10/26/2013   Procedure: LEFT HEART CATHETERIZATION WITH CORONARY ANGIOGRAM;  Surgeon: Peter M Swaziland, MD;  Location: [facility] CATH LAB;  Service: Cardiovascular;  Laterality: N/A;   MASS EXCISION Right 08/10/2019   Procedure: EXCISION OF CYST FROM RIGHT SCALP;  Surgeon: Curvin Deward MOULD, MD;  Location: [facility];  Service: General;  Laterality: Right;   Family History  Problem Relation Age of Onset   Heart attack Mother    Heart disease Mother    Heart attack Father    Heart disease Father    Rectal cancer Neg Hx    Stomach cancer Neg Hx    Pancreatic cancer Neg Hx    Esophageal cancer Neg Hx    Colon polyps Neg Hx    Colon cancer Neg Hx    Social History   Socioeconomic History   Marital status: Married    Spouse name: Not on file   Number of children: Not on file   Years of education: Not on file   Highest education level: 12th grade  Occupational History   Not on file  Tobacco Use   Smoking status: Former    Current packs/day: 0.00    Average packs/day: 2.0 packs/day for 30.0 years (60.0 ttl pk-yrs)    Types: Cigarettes    Start date: 01/04/1962    Quit date: 01/05/1992    Years since quitting: 32.3   Smokeless tobacco: Never   Tobacco comments:    Former smoker quit 19 yrs ago 11/22/22  Vaping Use   Vaping status: Never Used  Substance and Sexual Activity   Alcohol use: No   Drug use: Not Currently    Types: Marijuana   Sexual activity: Yes  Other Topics Concern   Not on file  Social History  Narrative   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   Social Drivers of Corporate investment banker Strain: Low Risk  (05/02/2024)   Overall Physicist, medical  Strain (CARDIA)    Difficulty of Paying Living Expenses: Not hard at all  Food Insecurity: No Food Insecurity (05/02/2024)   Hunger Vital Sign    Worried About Running Out of Food in the Last Year: Never true    Ran Out of Food in the Last Year: Never true  Transportation Needs: No Transportation Needs (05/02/2024)   PRAPARE - Administrator, Civil Service (Medical): No    Lack of Transportation (Non-Medical): No  Physical Activity: Insufficiently Active (05/02/2024)   Exercise Vital Sign    Days of Exercise per Week: 7 days    Minutes of Exercise per Session: 20 min  Stress: No Stress Concern Present (05/02/2024)   [employer] of Occupational Health - Occupational Stress Questionnaire    Feeling of Stress: Only a little  Social Connections: Moderately Isolated (05/02/2024)   Social Connection and Isolation Panel    Frequency of Communication with Friends and Family: More than three times a week    Frequency of Social Gatherings with Friends and Family: Three times a week    Attends Religious Services: Never    Active Member of Clubs or Organizations: No    Attends Banker Meetings: Never    Marital Status: Married    Tobacco Counseling Counseling given: No Tobacco comments: Former smoker quit 19 yrs ago 11/22/22    Clinical Intake:  Pre-visit preparation completed: Yes  Pain : No/denies pain     BMI - recorded: 27.69 Nutritional Status: BMI 25 -29 Overweight Nutritional Risks: None Diabetes: No  Lab Results  Component Value Date   HGBA1C 5.8 (H) 11/29/2018     How often do you need to have someone help you when you read instructions, pamphlets, or other written materials from your doctor or pharmacy?: 1 - Never  Interpreter Needed?:  No  Information entered by :: Verdie Saba, CMA   Activities of Daily Living     05/02/2024    8:16 AM  In your present state of health, do you have any difficulty performing the following activities:  Hearing? 0  Vision? 0  Difficulty concentrating or making decisions? 0  Walking or climbing stairs? 0  Dressing or bathing? 0  Doing errands, shopping? 0  Preparing Food and eating ? N  Using the Toilet? N  In the past six months, have you accidently leaked urine? Y  Comment sleeps with a pad  Do you have problems with loss of bowel control? N  Managing your Medications? N  Managing your Finances? N  Housekeeping or managing your Housekeeping? N    Patient Care Team: Rollene Almarie LABOR, MD as PCP - General (Internal Medicine) Delford Maude BROCKS, MD as PCP - Cardiology (Cardiology) Inocencio Soyla Lunger, MD as PCP - Electrophysiology (Cardiology) Shlomo Wilbert SAUNDERS, MD as PCP - Sleep Medicine (Cardiology) Regino Slater, MD as Attending Physician (Family Medicine) Szabat, Toribio BROCKS, [facility] (Inactive) (Pharmacist) Robinson Idol, MD as Consulting Physician (Ophthalmology)  I have updated your Care Teams any recent Medical Services you may have received from other providers in the past year.     Assessment:   This is a routine wellness examination for [name] Brown.  Hearing/Vision screen Hearing Screening - Comments:: Wears hearing aids Vision Screening - Comments:: Wears rx glasses - up to date with routine eye exams with Dr Robinson   Goals Addressed               This Visit's Progress     Patient Stated (  pt-stated)        Patient stated he plans to continue stay active (walking)       Depression Screen     05/02/2024    8:17 AM 01/26/2024    3:32 PM 05/02/2023    9:01 AM 04/30/2022    8:21 AM 04/30/2022    8:20 AM 04/15/2022    9:44 AM 04/23/2021    9:42 AM  PHQ 2/9 Scores  PHQ - 2 Score 0 0 0 0 0 0 0  PHQ- 9 Score 2  0   3     Fall Risk     05/02/2024    8:17 AM  02/10/2024   10:57 AM 01/26/2024    3:32 PM 05/30/2023    3:26 PM 05/02/2023    8:49 AM  Fall Risk   Falls in the past year? 0 0 0 0 0  Number falls in past yr: 0 0 0  0  Injury with Fall? 0 0 0 0 0  Risk for fall due to : No Fall Risks  No Fall Risks  No Fall Risks  Follow up Falls evaluation completed;Falls prevention discussed Falls evaluation completed Falls evaluation completed Falls evaluation completed Falls prevention discussed    MEDICARE RISK AT HOME:  Medicare Risk at Home Any stairs in or around the home?: No If so, are there any without handrails?: No Home free of loose throw rugs in walkways, pet beds, electrical cords, etc?: Yes Adequate lighting in your home to reduce risk of falls?: Yes Life alert?: No Use of a cane, walker or w/c?: No Grab bars in the bathroom?: No Shower chair or bench in shower?: Yes Elevated toilet seat or a handicapped toilet?: No  TIMED UP AND GO:  Was the test performed?  No  Cognitive Function: 6CIT completed        05/02/2024    8:20 AM 05/02/2023    9:05 AM  6CIT Screen  What Year? 0 points 0 points  What month? 0 points 0 points  What time? 0 points 0 points  Count back from 20 0 points 0 points  Months in reverse 0 points 0 points  Repeat phrase 0 points 0 points  Total Score 0 points 0 points    Immunizations Immunization History  Administered Date(s) Administered   Influenza, High Dose Seasonal PF 06/13/2019   Influenza,inj,Quad PF,6+ Mos 07/24/2013, 07/25/2014   Influenza-Unspecified 07/26/2015, 07/24/2016, 06/25/2017, 07/25/2018, 06/26/2019, 07/24/2021, 07/06/2022   Moderna SARS-COV2 Booster Vaccination 07/06/2022   Moderna Sars-Covid-2 Vaccination 11/23/2019, 12/21/2019   Pneumococcal Conjugate-13 10/25/2018   Pneumococcal Polysaccharide-23 02/06/2020   Tdap 10/11/2012   Zoster Recombinant(Shingrix) 08/12/2018, 09/13/2018, 11/10/2018   Zoster, Live 07/24/2013    Screening Tests Health Maintenance  Topic Date  Due   COVID-19 Vaccine (3 - Moderna risk series) 08/03/2022   DTaP/Tdap/Td (2 - Td or Tdap) 10/11/2022   INFLUENZA VACCINE  05/11/2024   Medicare Annual Wellness (AWV)  05/02/2025   Colonoscopy  07/20/2026   Pneumococcal Vaccine: 50+ Years  Completed   Hepatitis C Screening  Completed   Zoster Vaccines- Shingrix  Completed   Hepatitis B Vaccines  Aged Out   HPV VACCINES  Aged Out   Meningococcal B Vaccine  Aged Out    Health Maintenance  Health Maintenance Due  Topic Date Due   COVID-19 Vaccine (3 - Moderna risk series) 08/03/2022   DTaP/Tdap/Td (2 - Td or Tdap) 10/11/2022   Health Maintenance Items Addressed:05/02/2024  Additional Screening:  Vision Screening:  Recommended annual ophthalmology exams for early detection of glaucoma and other disorders of the eye. Would you like a referral to an eye doctor? No  Patient stated has seen Dr Robinson for an eye exam in 04/2024.  Dental Screening: Recommended annual dental exams for proper oral hygiene  Community Resource Referral / Chronic Care Management: CRR required this visit?  No   CCM required this visit?  No   Plan:    I have personally reviewed and noted the following in the patient's chart:   Medical and social history Use of alcohol, tobacco or illicit drugs  Current medications and supplements including opioid prescriptions. Patient is not currently taking opioid prescriptions. Functional ability and status Nutritional status Physical activity Advanced directives List of other physicians Hospitalizations, surgeries, and ER visits in previous 12 months Vitals Screenings to include cognitive, depression, and falls Referrals and appointments  In addition, I have reviewed and discussed with patient certain preventive protocols, quality metrics, and best practice recommendations. A written personalized care plan for preventive services as well as general preventive health recommendations were provided to  patient.   Verdie CHRISTELLA Saba, CMA   05/02/2024   After Visit Summary: (MyChart) Due to this being a telephonic visit, the after visit summary with patients personalized plan was offered to patient via MyChart   Notes: Nothing significant to report at this time.

## 2024-05-07 ENCOUNTER — Other Ambulatory Visit: Payer: Self-pay | Admitting: Internal Medicine

## 2024-05-07 DIAGNOSIS — N401 Enlarged prostate with lower urinary tract symptoms: Secondary | ICD-10-CM

## 2024-05-08 NOTE — Progress Notes (Unsigned)
 SLEEP MEDICINE VIRTUAL VISIT Video Note   Because of [name] Brown's co-morbid illnesses, he is at least at moderate risk for complications without adequate follow up.  This format is felt to be most appropriate for this patient at this time.  All issues noted in this document were discussed and addressed.  A limited physical exam was performed with this format.  Please refer to the patient's chart for his consent to telehealth for [name] Brown.       Date:  05/09/2024   ID:  [name] Brown, DOB [birth year], MRN [id number] The patient was identified using 2 identifiers.  Patient Location: Home Provider Location: Office/Clinic   PCP:  [name] Almarie LABOR, MD   [facility] Providers Cardiologist:  [name] Emmer, MD Electrophysiologist:  [name] Gladis Norton, MD  Sleep Medicine:  [name] Bihari, MD     Evaluation Performed:  Follow-Up Visit  Chief Complaint:  OSA  History of Present Illness:    [name] Brown is a 73 y.o. male with with  with a hx of afib, SSS, CAD.  He has a hx of OSA with home sleep study documenting this in 2018.  He was placed on CPAP but at that time did not tolerate CPAP therapy.  He was using a nasal mask or nasal pillow mask but it bothered him so much he could not use it.  He was referred to Dr. Micky for an oral device but apparently needed implants put in because he does not have enough teeth but it was cost prohibitive.    He decided to retry CPAP and underwent CPAP tiration in 08/2019.  Due to ongoing respiratory events he could not be adequately titrated on CPAP and was changed to BiPAP.  He again could not tolerate BiPAP.   Due to problems with excessive daytime sleepiness he was referred to ENT to be evaluated for left hypoglossal nerve stimulator.     He underwent Hypoglossal N stimulator placement 07/30/2020 and underwent device activation. He  was seen back in office and was doing well with his device and was at level 6.  He  underwent titration in the sleep lab but was inadequate due to ongoing respiratory events.  He was seen back in clinic 4 weeks ago and his functional threshold had decreased to 1.3V likely related to healing around the nerve and his range was changed to 1.7-2.7.     At last office visit he was doing well with his device and using it nightly. He was on level 6-7 which was 2.3V.  He was supposed to have a home sleep study but when he did the sleep study both times he had difficulty maneuvering the device and therefore the sleep study was not performed.   At last office visit he was doing well and was sleeping on average 5.6 hours a day.  He felt rested in the morning and did not really have any significant daytime sleepiness.  He was on level 8 which is 2.4 V.  At that visit his stimulation level was changed to a rate of 33 Hz with a pulse width of 120 s and maximum stim duration 4 seconds.  Final amplitude was 2.6 V with the patient control range of 2.2 to 2.6 V.  Start delay was set at 60 minutes and pause time 30 minutes.     At office visit 04/03/2024 he called in stating he was having problems with his device.  He started  slowly developing more tongue discomfort.  Initially on level 3 (2.3 V (but then slowly had to go down to level 1 because of tongue discomfort.  He then ultimately stopped using his device until he get back into the office.  It was felt that he had a threshold change and needed a lower output.  Functional testing that day showed a functional voltage 1.5 and range was changed to a range of 1.5 to 2.1 V.  He was started at level 2 (1.6 V) and told to slowly increase to level 4 (1.8 V).  He is back today and doing quite well.  He no longer has discomfort and he is currently on level 5 (1.9V).  He has been having trouble sleeping due to back pain. He is sleeping 4 hours nightly. He has been napping 45 minutes in the afternoon.  He does not wake up gasping for breath and is not snoring per  his wife.  Past Medical History:  Diagnosis Date   Aortic aneurysm [place] [facility]) Endograf 2009   Arthritis    Atrial fibrillation (HCC)    Bradycardia    CAD (coronary artery disease) CABG 03/2000   Median sternotomy for coronary artery bypass grafting x 3 (left   HTN (hypertension)    Hx of CABG 2001   severe 3 vessel disease   Hx of colonic polyp 06/2010   Hyperplastic    Hyperlipidemia    Myocardial infarction [facility])    Sleep apnea    Past Surgical History:  Procedure Laterality Date   ATRIAL FIBRILLATION ABLATION N/A 03/19/2020   Procedure: ATRIAL FIBRILLATION ABLATION;  Surgeon: Inocencio Soyla Lunger, MD;  Location: [facility] INVASIVE CV LAB;  Service: Cardiovascular;  Laterality: N/A;   ATRIAL FIBRILLATION ABLATION N/A 10/25/2022   Procedure: ATRIAL FIBRILLATION ABLATION;  Surgeon: Inocencio Soyla Lunger, MD;  Location: [facility] INVASIVE CV LAB;  Service: Cardiovascular;  Laterality: N/A;   CARDIOVERSION N/A 11/28/2018   Procedure: CARDIOVERSION;  Surgeon: Loni Soyla LABOR, MD;  Location: [facility] ENDOSCOPY;  Service: Cardiovascular;  Laterality: N/A;   CARDIOVERSION N/A 01/13/2021   Procedure: CARDIOVERSION;  Surgeon: Mona Vinie BROCKS, MD;  Location: [facility] ENDOSCOPY;  Service: Cardiovascular;  Laterality: N/A;   CARDIOVERSION N/A 01/04/2023   Procedure: CARDIOVERSION;  Surgeon: Hobart Powell BRAVO, MD;  Location: [facility] END[facility];  Service: Cardiovascular;  Laterality: N/A;   COLONOSCOPY     CORONARY ARTERY BYPASS GRAFT     AAA stent graft on 12/01/2007 at [facility]  coronary bypass in 2001. Dr. Dusty.   DRUG INDUCED ENDOSCOPY Right 02/18/2020   Procedure: DRUG INDUCED ENDOSCOPY;  Surgeon: Carlie Clark, MD;  Location: [facility];  Service: ENT;  Laterality: Right;   FOOT SURGERY Left 07/19/2017   IMPLANTATION OF HYPOGLOSSAL NERVE STIMULATOR Right 07/30/2020   Procedure: IMPLANTATION OF HYPOGLOSSAL NERVE STIMULATOR Implantation of Chest wall respirator Sensor Electrode Electronic  Analysis of Implanted Neurostimulator Pulse Generator;  Surgeon: Carlie Clark, MD;  Location: [facility];  Service: ENT;  Laterality: Right;   INGUINAL HERNIA REPAIR Bilateral 08/06/2022   Procedure: LAPAROSCOPIC BILATERAL INGUINAL HERNIA REPAIR WITH MESH AND PRIMARY CLOSURE OF UMBILICAL HERNIA;  Surgeon: Lyndel Deward PARAS, MD;  Location: [facility] ORS;  Service: General;  Laterality: Bilateral;   LEFT HEART CATHETERIZATION WITH CORONARY ANGIOGRAM N/A 10/26/2013   Procedure: LEFT HEART CATHETERIZATION WITH CORONARY ANGIOGRAM;  Surgeon: Peter M Swaziland, MD;  Location: [facility] CATH LAB;  Service: Cardiovascular;  Laterality: N/A;   MASS EXCISION Right 08/10/2019  Procedure: EXCISION OF CYST FROM RIGHT SCALP;  Surgeon: Curvin Mt III, MD;  Location: [facility];  Service: General;  Laterality: Right;     Current Meds  Medication Sig   acetaminophen  (TYLENOL ) 500 MG tablet Take 1,000 mg by mouth every 4 (four) hours as needed for moderate pain.   albuterol  (VENTOLIN  HFA) 108 (90 Base) MCG/ACT inhaler Inhale 2 puffs into the lungs every 6 (six) hours as needed for wheezing or shortness of breath.   aspirin  EC 81 MG EC tablet Take 1 tablet (81 mg total) by mouth daily.   Biotin 89999 MCG TABS Take 10,000 mcg by mouth in the morning.   cholecalciferol  (VITAMIN D) 25 MCG (1000 UT) tablet Take 1,000 Units by mouth every evening.   cyclobenzaprine  (FLEXERIL ) 5 MG tablet TAKE 1 TABLET(5 MG) BY MOUTH THREE TIMES DAILY AS NEEDED FOR MUSCLE SPASMS   diclofenac  sodium (VOLTAREN ) 1 % GEL Apply 2 g topically 4 (four) times daily.   divalproex  (DEPAKOTE  ER) 500 MG 24 hr tablet Take 1 tablet (500 mg total) by mouth daily.   dofetilide  (TIKOSYN ) 250 MCG capsule TAKE 1 CAPSULE(250 MCG) BY MOUTH TWICE DAILY   doxycycline  (VIBRAMYCIN ) 100 MG capsule Take 100 mg by mouth every evening.   ezetimibe  (ZETIA ) 10 MG tablet TAKE 1 TABLET(10 MG) BY MOUTH DAILY   furosemide  (LASIX ) 20 MG tablet Take 1  tablet (20 MG) By mouth twice daily. As needed, patient may take 20 MG (1 tablet) additional Lasix  PRN by mouth daily x 4 days as directed per [employer] HF Study PRN plan   gabapentin  (NEURONTIN ) 300 MG capsule TAKE 1 CAPSULE(300 MG) BY MOUTH TWICE DAILY   Glucosamine-Chondroitin (GLUCOSAMINE CHONDR COMPLEX PO) Take 500 mg by mouth 2 (two) times daily.    guaiFENesin  (MUCINEX ) 600 MG 12 hr tablet Take 600 mg by mouth 2 (two) times daily.   hydrALAZINE  (APRESOLINE ) 50 MG tablet Take 1 tablet (50 mg total) by mouth 3 (three) times daily.   isosorbide  mononitrate (IMDUR ) 30 MG 24 hr tablet TAKE 1 TABLET BY MOUTH EVERY DAY   montelukast  (SINGULAIR ) 10 MG tablet TAKE 1 TABLET(10 MG) BY MOUTH AT BEDTIME   Multiple Vitamin (MULTIVITAMIN WITH MINERALS) TABS tablet Take 1 tablet by mouth every evening.   nitroGLYCERIN  (NITROSTAT ) 0.4 MG SL tablet DISSOLVE ONE TABLET UNDER TONGUE AS NEEDED FOR CHEST PAIN EVERY 5 MINUTES   Omega-3 Fatty Acids (FISH OIL) 1200 MG CAPS Take 1,200 mg by mouth 2 (two) times daily.   pantoprazole  (PROTONIX ) 40 MG tablet Take 1 tablet (40 mg total) by mouth daily.   potassium chloride  SA (KLOR-CON  M) 20 MEQ tablet TAKE 1 TABLET BY MOUTH EVERY DAY   ramipril  (ALTACE ) 10 MG capsule TAKE 2 CAPSULES(20 MG) BY MOUTH DAILY   REPATHA  SURECLICK 140 MG/ML SOAJ ADMINISTER 1 ML UNDER THE SKIN EVERY 14 DAYS   tamsulosin  (FLOMAX ) 0.4 MG CAPS capsule TAKE 1 CAPSULE(0.4 MG) BY MOUTH DAILY   Tetrahydrozoline HCl (VISINE OP) Place 1 drop into both eyes daily as needed (irritation).   vitamin C  (ASCORBIC ACID ) 500 MG tablet Take 500 mg by mouth daily.   XARELTO  20 MG TABS tablet TAKE 1 TABLET(20 MG) BY MOUTH DAILY WITH SUPPER   zolpidem  (AMBIEN  CR) 12.5 MG CR tablet TAKE 1 TABLET(12.5 MG) BY MOUTH AT BEDTIME     Allergies:   Statins   Social History   Tobacco Use   Smoking status: Former    Current packs/day: 0.00  Average packs/day: 2.0 packs/day for 30.0 years (60.0 ttl pk-yrs)     Types: Cigarettes    Start date: 01/04/1962    Quit date: 01/05/1992    Years since quitting: 32.3   Smokeless tobacco: Never   Tobacco comments:    Former smoker quit 19 yrs ago 11/22/22  Vaping Use   Vaping status: Never Used  Substance Use Topics   Alcohol use: No   Drug use: Not Currently    Types: Marijuana     Family Hx: The patient's family history includes Heart attack in his father and mother; Heart disease in his father and mother. There is no history of Rectal cancer, Stomach cancer, Pancreatic cancer, Esophageal cancer, Colon polyps, or Colon cancer.  ROS:   Please see the history of present illness.     All other systems reviewed and are negative.   Prior Sleep studies:   The following studies were reviewed today:  None  Labs/Other Tests and Data Reviewed:     Recent Labs: 08/12/2023: NT-Pro BNP 444 01/26/2024: ALT 13 02/15/2024: BNP 151.4; BUN 25; Creatinine, Ser 1.10; Hemoglobin 12.3; Magnesium 2.2; Platelets 214; Potassium 4.2; Sodium 138   Wt Readings from Last 3 Encounters:  05/09/24 193 lb (87.5 kg)  05/02/24 193 lb (87.5 kg)  04/03/24 193 lb (87.5 kg)     Risk Assessment/Calculations:      Objective:    Vital Signs:  BP 110/67   Pulse 65   Ht 5' 10 (1.778 m)   Wt 193 lb (87.5 kg)   BMI 27.69 kg/m    VITAL SIGNS:  reviewed GEN:  no acute distress EYES:  sclerae anicteric, EOMI - Extraocular Movements Intact RESPIRATORY:  normal respiratory effort, symmetric expansion CARDIOVASCULAR:  no peripheral edema SKIN:  no rash, lesions or ulcers. MUSCULOSKELETAL:  no obvious deformities. NEURO:  alert and oriented x 3, no obvious focal deficit PSYCH:  normal affect  ASSESSMENT & PLAN:    OSA -he has had a sleep study in 2018 and was tried on CPAP but he did not tolerate the PAP both in 2018 and on retry in 2021. -the oral device was cost prohibitive due to needing major dental work prior to getting the device so that is not an option -now  s/p hypoglossal N stimulator -Initial parameters were set as follows:             Amplitude 2.3V             Patient control 2.3-3.3V             Pulse width             Rate 33Hz              Start delay 30 min             Pause time 15 min             Therapy duration 8 hours             Electrodes +/-/+ -he had a titration in sleep lab done in March 2022 and titration was inadequate due to ongoing respiratory events at peak Voltage of 3.6V.   -his device was interrogated in office after sleep study and his functional threshold had significantly decreased to 1.3V likely related to healing around the nerve where the lead was implanted and the device was reprogrammed to a range of 1.7 to 2.7V -At last office visit he was doing well with his device  and was on level 8 which is 2.4 V and his range was changed to 2.2 to 2.7 V. -He underwent an [facility] home sleep study which showed moderate obstructive sleep apnea with an AHI of 22.3/h and mild central sleep apnea with 10.1% Cheyne-Stokes respirations. -He was brought back to the lab for an attempt at repeat inspire titration but due to ongoing respiratory events this was unsuccessful. -After detailed review of his last [place] sleep study it was apparent that during REM sleep he actually had no significant events but in later stages of sleep with more frequent awakenings and leg movements he seemed to have more events. -His device was reinterrogated:             -Sensing level was 2.3 V and functional level was 2.3 V with comfortable tongue movement             -Stimulation level was changed to a rate of 33 Hz with a pulse width of 120 s and a maximum stim duration of 4 seconds             -Final amplitude 2.6 V with patient control from 2.2 to 2.6 V             -Start delay At 60 minutes and pause time 30 minutes             -Therapy duration kept at 8 hours -Office visit 04/03/2024 he presented because he has been slowly having more discomfort  with his tongue.  He was initially on level 3 (2.3 V) but slowly had to go down to level 1 because of tongue discomfort.  Then ultimately had to stop using the inspire device because he could not tolerate the voltage.  After interrogation today it was felt that he likely had a threshold change and needed to be at a lower output. Functional testing showed a functional voltage of 1.5 so his range was changed to 1.5 to 2.1 V Pulse width was also changed to 60 with a rate of 40 He [name] start at level 2 (1.6 V) and slowly increase as tolerated to goal of level 4 (1.8 V) We [name] plan a 4-week virtual visit to see how he is doing on the settings and then schedule a home sleep study if there are no issues with his device If home sleep study shows a good AHI then we [name] have follow-up with inspire rep in 6 months He is back today 05/09/2024 and doing much better.  After the changes were made he has been able to use his device again.  He is currently on level 5 and tolerating without any problems in tongue discomfort or sore throat.  Today I [name] order a home sleep study and if his AHI is adequate we [name] have him follow-up in 6 months with the inspire rep his blood   2.  HTN - Blood pressures at home have been well-controlled - Continue hydralazine  50 mg 3 times daily, Imdur  30 mg daily, ramipril  20 mg daily with as needed refills   Total time of encounter: 15 minutes total time of encounter, including 5 minutes spent in face-to-face patient care on the date of this encounter. This time includes coordination of care and counseling regarding above mentioned problem list. Remainder of non-face-to-face time involved reviewing chart documents/testing relevant to the patient encounter and documentation in the medical record. I have independently reviewed documentation from referring provider.    Medication Adjustments/Labs and Tests Ordered: Current medicines  are reviewed at length with the patient today.   Concerns regarding medicines are outlined above.   Tests Ordered: No orders of the defined types were placed in this encounter.   Medication Changes: No orders of the defined types were placed in this encounter.   Follow Up:  In Person in 6 month(s)  Signed, [name] Bihari, MD  05/09/2024 9:42 AM    [facility]

## 2024-05-09 ENCOUNTER — Ambulatory Visit: Attending: Cardiology | Admitting: Cardiology

## 2024-05-09 VITALS — BP 110/67 | HR 65 | Ht 70.0 in | Wt 193.0 lb

## 2024-05-09 DIAGNOSIS — G4733 Obstructive sleep apnea (adult) (pediatric): Secondary | ICD-10-CM

## 2024-05-09 DIAGNOSIS — I10 Essential (primary) hypertension: Secondary | ICD-10-CM

## 2024-05-09 NOTE — Addendum Note (Signed)
 Addended by: JANIT GENI CROME on: 05/09/2024 09:52 AM   Modules accepted: Orders

## 2024-05-09 NOTE — Patient Instructions (Signed)
 Medication Instructions:  Your physician recommends that you continue on your current medications as directed. Please refer to the Current Medication list given to you today.  *If you need a refill on your cardiac medications before your next appointment, please call your pharmacy*  Lab Work: None.  If you have labs (blood work) drawn today and your tests are completely normal, you will receive your results only by: MyChart Message (if you have MyChart) OR A paper copy in the mail If you have any lab test that is abnormal or we need to change your treatment, we will call you to review the results.  Testing/Procedures: Your physician has recommended that you have a home sleep study while using your Inspire device. This test records several body functions during sleep, including: brain activity, eye movement, oxygen and carbon dioxide blood levels, heart rate and rhythm, breathing rate and rhythm, the flow of air through your mouth and nose, snoring, body muscle movements, and chest and belly movement.   Follow-Up: At [facility], you and your health needs are our priority.  As part of our continuing mission to provide you with exceptional heart care, our providers are all part of one team.  This team includes your primary Cardiologist (physician) and Advanced Practice Providers or APPs (Physician Assistants and Nurse Practitioners) who all work together to provide you with the care you need, when you need it.  Your next appointment:   6 month(s) (This appointment will be with Dr. Shlomo and Elizabeth Rep.)  Provider:   Dr. Wilbert Shlomo, MD

## 2024-05-09 NOTE — Progress Notes (Signed)
   SNORE: No TIRED: YES OBSERVED: NO HYPERTENSION: YES BMI > 35:NO AGE> 50 yrs: YES NECK: NO Male: YES  STOPBANG 4

## 2024-05-22 ENCOUNTER — Other Ambulatory Visit: Payer: Self-pay | Admitting: Internal Medicine

## 2024-05-23 ENCOUNTER — Other Ambulatory Visit: Payer: Self-pay | Admitting: Internal Medicine

## 2024-06-01 ENCOUNTER — Ambulatory Visit: Admitting: Internal Medicine

## 2024-06-01 ENCOUNTER — Encounter: Payer: Self-pay | Admitting: Internal Medicine

## 2024-06-01 VITALS — BP 140/68 | HR 52 | Temp 97.8°F | Ht 70.0 in | Wt 190.0 lb

## 2024-06-01 DIAGNOSIS — I719 Aortic aneurysm of unspecified site, without rupture: Secondary | ICD-10-CM

## 2024-06-01 DIAGNOSIS — Z0000 Encounter for general adult medical examination without abnormal findings: Secondary | ICD-10-CM

## 2024-06-01 DIAGNOSIS — N401 Enlarged prostate with lower urinary tract symptoms: Secondary | ICD-10-CM

## 2024-06-01 DIAGNOSIS — K219 Gastro-esophageal reflux disease without esophagitis: Secondary | ICD-10-CM | POA: Diagnosis not present

## 2024-06-01 DIAGNOSIS — I4819 Other persistent atrial fibrillation: Secondary | ICD-10-CM

## 2024-06-01 DIAGNOSIS — R3912 Poor urinary stream: Secondary | ICD-10-CM

## 2024-06-01 DIAGNOSIS — D6869 Other thrombophilia: Secondary | ICD-10-CM

## 2024-06-01 DIAGNOSIS — G44009 Cluster headache syndrome, unspecified, not intractable: Secondary | ICD-10-CM

## 2024-06-01 DIAGNOSIS — E782 Mixed hyperlipidemia: Secondary | ICD-10-CM | POA: Diagnosis not present

## 2024-06-01 DIAGNOSIS — I10 Essential (primary) hypertension: Secondary | ICD-10-CM

## 2024-06-01 MED ORDER — TAMSULOSIN HCL 0.4 MG PO CAPS: 0.4000 mg | ORAL_CAPSULE | Freq: Every day | ORAL | 3 refills | Status: AC

## 2024-06-01 MED ORDER — DIVALPROEX SODIUM ER 500 MG PO TB24: 500.0000 mg | ORAL_TABLET | Freq: Every day | ORAL | 3 refills | Status: DC

## 2024-06-01 MED ORDER — PANTOPRAZOLE SODIUM 40 MG PO TBEC: 40.0000 mg | DELAYED_RELEASE_TABLET | Freq: Every day | ORAL | 3 refills | Status: AC

## 2024-06-01 MED ORDER — MONTELUKAST SODIUM 10 MG PO TABS: 10.0000 mg | ORAL_TABLET | Freq: Every day | ORAL | 3 refills | Status: AC

## 2024-06-01 MED ORDER — CYCLOBENZAPRINE HCL 5 MG PO TABS: 5.0000 mg | ORAL_TABLET | Freq: Three times a day (TID) | ORAL | 0 refills | Status: AC | PRN

## 2024-06-01 MED ORDER — ALBUTEROL SULFATE HFA 108 (90 BASE) MCG/ACT IN AERS: 2.0000 | INHALATION_SPRAY | Freq: Four times a day (QID) | RESPIRATORY_TRACT | 1 refills | Status: AC | PRN

## 2024-06-01 NOTE — Assessment & Plan Note (Signed)
 Doing well with protonix  40 mg daily continue.

## 2024-06-01 NOTE — Assessment & Plan Note (Signed)
 Gets yearly monitoring through cardiology. BP goal <130/80 and home readings are at goal.

## 2024-06-01 NOTE — Assessment & Plan Note (Signed)
 Taking xarelto  and no clinical signs of bleeding.

## 2024-06-01 NOTE — Assessment & Plan Note (Signed)
Flu shot yearly. Pneumonia complete. Shingrix complete. Tetanus due at pharmacy. Colonoscopy up to date. Counseled about sun safety and mole surveillance. Counseled about the dangers of distracted driving. Given 10 year screening recommendations.

## 2024-06-01 NOTE — Progress Notes (Signed)
   Subjective:   Patient ID: [name] Brown, male    DOB: 1951/05/08, 73 y.o.   MRN: [id number]  The patient is here for physical. Pertinent topics discussed if any:  PMH, [facility], social history reviewed and updated  Review of Systems  Constitutional: Negative.   HENT: Negative.    Eyes: Negative.   Respiratory:  Negative for cough, chest tightness and shortness of breath.   Cardiovascular:  Negative for chest pain, palpitations and leg swelling.  Gastrointestinal:  Negative for abdominal distention, abdominal pain, constipation, diarrhea, nausea and vomiting.  Genitourinary:  Positive for enuresis.  Musculoskeletal:  Positive for arthralgias.  Skin: Negative.   Neurological: Negative.   Psychiatric/Behavioral: Negative.      Objective:  Physical Exam Constitutional:      Appearance: He is well-developed.  HENT:     Head: Normocephalic and atraumatic.  Cardiovascular:     Rate and Rhythm: Normal rate and regular rhythm.  Pulmonary:     Effort: Pulmonary effort is normal. No respiratory distress.     Breath sounds: Normal breath sounds. No wheezing or rales.  Abdominal:     General: Bowel sounds are normal. There is no distension.     Palpations: Abdomen is soft.     Tenderness: There is no abdominal tenderness. There is no rebound.  Musculoskeletal:     Cervical back: Normal range of motion.  Skin:    General: Skin is warm and dry.  Neurological:     Mental Status: He is alert and oriented to person, place, and time.     Coordination: Coordination normal.     Vitals:   06/01/24 1002 06/01/24 1012  BP: (!) 140/68 (!) 140/68  Pulse: (!) 52   Temp: 97.8 F (36.6 C)   TempSrc: Oral   SpO2: 93%   Weight: 190 lb (86.2 kg)   Height: 5' 10 (1.778 m)     Assessment & Plan:

## 2024-06-01 NOTE — Assessment & Plan Note (Signed)
 BP at goal on current regimen will continue. Reviewed recent BMP.

## 2024-06-01 NOTE — Assessment & Plan Note (Signed)
 Reviewed recent lipid panel and at goal continue current regimen.

## 2024-06-01 NOTE — Assessment & Plan Note (Signed)
 Taking tikosyn  and diltiazem and this is doing well. Xarelto  for stroke prevention.

## 2024-06-05 ENCOUNTER — Encounter: Payer: Self-pay | Admitting: Pharmacy Technician

## 2024-06-05 ENCOUNTER — Other Ambulatory Visit (HOSPITAL_COMMUNITY): Payer: Self-pay

## 2024-06-05 ENCOUNTER — Telehealth: Payer: Self-pay | Admitting: Pharmacy Technician

## 2024-06-05 NOTE — Telephone Encounter (Signed)
 A user error has taken place: encounter opened in error, closed for administrative reasons.

## 2024-06-05 NOTE — Telephone Encounter (Signed)
 Pharmacy Patient Advocate Encounter   Received notification from Onbase that prior authorization for Cyclobenzaprine  5mg  is required/requested.   Insurance verification completed.   The patient is insured through [facility] ADVANTAGE/RX ADVANCE .   Per test claim: PA required; PA submitted to above mentioned insurance via Latent Key/confirmation #/EOC [id number] Status is pending

## 2024-06-06 NOTE — Telephone Encounter (Signed)
 Pharmacy Patient Advocate Encounter  Received notification from [facility] ADVANTAGE/RX ADVANCE that Prior Authorization for CYCLOBENZAPRINE  5MG  TABS has been APPROVED from 06/04/24 to 07/03/24   PA #/Case ID/Reference #: [id number]

## 2024-06-11 ENCOUNTER — Other Ambulatory Visit: Payer: Self-pay | Admitting: Cardiology

## 2024-06-12 NOTE — Telephone Encounter (Signed)
 Prescription refill request for Xarelto  received.  Indication: AF Last office visit: 7/25 Weight: 86.2 Age: 73 Scr: 1.1 CrCl: 74

## 2024-06-23 ENCOUNTER — Ambulatory Visit (INDEPENDENT_AMBULATORY_CARE_PROVIDER_SITE_OTHER)

## 2024-06-23 DIAGNOSIS — I4891 Unspecified atrial fibrillation: Secondary | ICD-10-CM | POA: Diagnosis not present

## 2024-07-09 LAB — CUP PACEART REMOTE DEVICE CHECK
Date Time Interrogation Session: 20250913163156
Implantable Pulse Generator Implant Date: 20240506

## 2024-07-10 ENCOUNTER — Ambulatory Visit: Payer: Self-pay | Admitting: Cardiology

## 2024-07-10 NOTE — Progress Notes (Signed)
 Remote Loop Recorder Transmission

## 2024-07-17 ENCOUNTER — Other Ambulatory Visit: Payer: Self-pay | Admitting: Internal Medicine

## 2024-07-17 DIAGNOSIS — G44009 Cluster headache syndrome, unspecified, not intractable: Secondary | ICD-10-CM

## 2024-07-18 ENCOUNTER — Other Ambulatory Visit (HOSPITAL_COMMUNITY)

## 2024-07-18 ENCOUNTER — Ambulatory Visit (HOSPITAL_COMMUNITY)
Admission: RE | Admit: 2024-07-18 | Discharge: 2024-07-18 | Disposition: A | Discharge status: Home / Self Care | Source: Ambulatory Visit | Attending: Cardiovascular Disease | Admitting: Cardiovascular Disease

## 2024-07-18 ENCOUNTER — Ambulatory Visit: Payer: Self-pay | Admitting: Cardiovascular Disease

## 2024-07-18 DIAGNOSIS — I6522 Occlusion and stenosis of left carotid artery: Secondary | ICD-10-CM | POA: Insufficient documentation

## 2024-07-20 NOTE — Progress Notes (Signed)
 Patient viewed in [name] Brown   Last read by Victory KANDICE Can at 7:54AM on 07/19/2024.

## 2024-07-25 ENCOUNTER — Ambulatory Visit (INDEPENDENT_AMBULATORY_CARE_PROVIDER_SITE_OTHER)

## 2024-07-25 DIAGNOSIS — I4891 Unspecified atrial fibrillation: Secondary | ICD-10-CM

## 2024-07-26 ENCOUNTER — Encounter

## 2024-07-26 LAB — CUP PACEART REMOTE DEVICE CHECK
Date Time Interrogation Session: 20251014230153
Date Time Interrogation Session: 20251015172304
Implantable Pulse Generator Implant Date: 20240506
Implantable Pulse Generator Implant Date: 20240506
Zone Setting Status: 755011
Zone Setting Status: 755011
Zone Setting Status: 755011
Zone Setting Status: 755011

## 2024-07-27 NOTE — Progress Notes (Signed)
 Remote Loop Recorder Transmission

## 2024-07-29 ENCOUNTER — Other Ambulatory Visit: Payer: Self-pay | Admitting: Cardiovascular Disease

## 2024-07-29 DIAGNOSIS — I25729 Atherosclerosis of autologous artery coronary artery bypass graft(s) with unspecified angina pectoris: Secondary | ICD-10-CM

## 2024-07-29 DIAGNOSIS — E785 Hyperlipidemia, unspecified: Secondary | ICD-10-CM

## 2024-07-30 ENCOUNTER — Ambulatory Visit: Payer: Self-pay | Admitting: Cardiology

## 2024-08-15 ENCOUNTER — Encounter (HOSPITAL_COMMUNITY): Payer: Self-pay | Admitting: Internal Medicine

## 2024-08-15 ENCOUNTER — Ambulatory Visit (HOSPITAL_COMMUNITY)
Admission: RE | Admit: 2024-08-15 | Discharge: 2024-08-15 | Disposition: A | Discharge status: Home / Self Care | Source: Ambulatory Visit | Attending: Internal Medicine | Admitting: Internal Medicine

## 2024-08-15 VITALS — BP 150/62 | HR 55 | Ht 70.0 in | Wt 194.4 lb

## 2024-08-15 DIAGNOSIS — Z5181 Encounter for therapeutic drug level monitoring: Secondary | ICD-10-CM | POA: Diagnosis not present

## 2024-08-15 DIAGNOSIS — I4819 Other persistent atrial fibrillation: Secondary | ICD-10-CM | POA: Diagnosis not present

## 2024-08-15 DIAGNOSIS — Z79899 Other long term (current) drug therapy: Secondary | ICD-10-CM

## 2024-08-15 DIAGNOSIS — D6869 Other thrombophilia: Secondary | ICD-10-CM

## 2024-08-15 NOTE — Progress Notes (Signed)
 Primary Care Physician: Rollene Almarie LABOR, MD Primary Cardiologist: Dr Delford Primary Electrophysiologist: Dr Inocencio Referring Physician: Dr Deno Victory [name] Brown is a 73 y.o. male with a history of coronary artery disease status post three-vessel CABG, hypertension, OSA, hyperlipidemia, and atrial fibrillation.  He was found to be in atrial fibrillation incidentally on office visit 08/30/2017.  He was put on Eliquis  at the time.  Beta-blockers were not started due to resting bradycardia.  January 2020 he was noted increasing dyspnea on exertion.  He had a cardioversion 11/28/18 and was started on amiodarone . He does report today that he was diagnosed with OSA a few years ago but could not tolerate CPAP therapy. He is on Eliquis  for a CHADS2VASC score of 3. Patient is s/p afib ablation 03/19/20. He had recurrent persistent afib and is s/p DCCV on 01/13/21. He initially did well but went back into persistent afib and underwent repeat ablation with Dr Inocencio on 10/25/22. He is s/p Tikosyn  admission 6/11-14/24. He did not require cardioversion. Dose reduced due to Qtc lengthening. Monitored until discharge on telemetry which demonstrates Afib > SB. Currently on Tikosyn  250 mcg BID.   On follow up 08/15/24, patient is here for Tikosyn  surveillance. He has had overall no Afib burden since last office visit confirmed by ILR checks. No missed doses of Tikosyn  or Xarelto .   Today, he denies symptoms of palpitations, chest pain, orthopnea, PND, lower extremity edema, dizziness, presyncope, syncope, bleeding, or neurologic sequela. The patient is tolerating medications without difficulties and is otherwise without complaint today.    Atrial Fibrillation Risk Factors:  he does have symptoms or diagnosis of sleep apnea. he has an Doctor, hospital.  he does not have a history of rheumatic fever. he does not have a history of alcohol use. The patient does not have a history of early familial atrial  fibrillation or other arrhythmias.  he has a BMI of Body mass index is 27.89 kg/m.SABRA Filed Weights   08/15/24 0901  Weight: 88.2 kg     Family History  Problem Relation Age of Onset   Heart attack Mother    Heart disease Mother    Heart attack Father    Heart disease Father    Rectal cancer Neg Hx    Stomach cancer Neg Hx    Pancreatic cancer Neg Hx    Esophageal cancer Neg Hx    Colon polyps Neg Hx    Colon cancer Neg Hx     Atrial Fibrillation Management history:  Previous antiarrhythmic drugs: amiodarone , tikosyn  Previous cardioversions: 11/2018, 01/13/21 Previous ablations: 03/19/20, 10/25/22 CHADS2VASC score: 3 Anticoagulation history: Eliquis    Past Medical History:  Diagnosis Date   Aortic aneurysm Endograf 2009   Arthritis    Atrial fibrillation (HCC)    Bradycardia    CAD (coronary artery disease) CABG 03/2000   Median sternotomy for coronary artery bypass grafting x 3 (left   HTN (hypertension)    Hx of CABG 2001   severe 3 vessel disease   Hx of colonic polyp 06/2010   Hyperplastic    Hyperlipidemia    Myocardial infarction [facility])    Sleep apnea    Past Surgical History:  Procedure Laterality Date   ATRIAL FIBRILLATION ABLATION N/A 03/19/2020   Procedure: ATRIAL FIBRILLATION ABLATION;  Surgeon: Inocencio Soyla Lunger, MD;  Location: [facility] INVASIVE CV LAB;  Service: Cardiovascular;  Laterality: N/A;   ATRIAL FIBRILLATION ABLATION N/A 10/25/2022   Procedure: ATRIAL FIBRILLATION ABLATION;  Surgeon: Inocencio,  Soyla Lunger, MD;  Location: [facility] INVASIVE CV LAB;  Service: Cardiovascular;  Laterality: N/A;   CARDIOVERSION N/A 11/28/2018   Procedure: CARDIOVERSION;  Surgeon: Loni Soyla LABOR, MD;  Location: [facility] ENDOSCOPY;  Service: Cardiovascular;  Laterality: N/A;   CARDIOVERSION N/A 01/13/2021   Procedure: CARDIOVERSION;  Surgeon: Mona Vinie BROCKS, MD;  Location: [facility] ENDOSCOPY;  Service: Cardiovascular;  Laterality: N/A;   CARDIOVERSION N/A 01/04/2023   Procedure:  CARDIOVERSION;  Surgeon: Hobart Powell BRAVO, MD;  Location: [facility] ENDOSCOPY;  Service: Cardiovascular;  Laterality: N/A;   COLONOSCOPY     CORONARY ARTERY BYPASS GRAFT     AAA stent graft on 12/01/2007 at [facility]  coronary bypass in 2001. Dr. Dusty.   DRUG INDUCED ENDOSCOPY Right 02/18/2020   Procedure: DRUG INDUCED ENDOSCOPY;  Surgeon: Carlie Clark, MD;  Location: [facility];  Service: ENT;  Laterality: Right;   FOOT SURGERY Left 07/19/2017   IMPLANTATION OF HYPOGLOSSAL NERVE STIMULATOR Right 07/30/2020   Procedure: IMPLANTATION OF HYPOGLOSSAL NERVE STIMULATOR Implantation of Chest wall respirator Sensor Electrode Electronic Analysis of Implanted Neurostimulator Pulse Generator;  Surgeon: Carlie Clark, MD;  Location: [facility];  Service: ENT;  Laterality: Right;   INGUINAL HERNIA REPAIR Bilateral 08/06/2022   Procedure: LAPAROSCOPIC BILATERAL INGUINAL HERNIA REPAIR WITH MESH AND PRIMARY CLOSURE OF UMBILICAL HERNIA;  Surgeon: Lyndel Deward PARAS, MD;  Location: [facility] ORS;  Service: General;  Laterality: Bilateral;   LEFT HEART CATHETERIZATION WITH CORONARY ANGIOGRAM N/A 10/26/2013   Procedure: LEFT HEART CATHETERIZATION WITH CORONARY ANGIOGRAM;  Surgeon: Peter M Jordan, MD;  Location: [facility] CATH LAB;  Service: Cardiovascular;  Laterality: N/A;   MASS EXCISION Right 08/10/2019   Procedure: EXCISION OF CYST FROM RIGHT SCALP;  Surgeon: Curvin Deward III, MD;  Location: [facility];  Service: General;  Laterality: Right;    Current Outpatient Medications  Medication Sig Dispense Refill   acetaminophen  (TYLENOL ) 500 MG tablet Take 1,000 mg by mouth every 4 (four) hours as needed for moderate pain.     albuterol  (VENTOLIN  HFA) 108 (90 Base) MCG/ACT inhaler Inhale 2 puffs into the lungs every 6 (six) hours as needed for wheezing or shortness of breath. 54 g 1   aspirin  EC 81 MG EC tablet Take 1 tablet (81 mg total) by mouth daily.     Biotin  89999 MCG TABS Take 10,000 mcg by mouth in the morning.     cholecalciferol  (VITAMIN D) 25 MCG (1000 UT) tablet Take 1,000 Units by mouth every evening.     cyclobenzaprine  (FLEXERIL ) 5 MG tablet Take 1 tablet (5 mg total) by mouth 3 (three) times daily as needed for muscle spasms. 60 tablet 0   diclofenac  sodium (VOLTAREN ) 1 % GEL Apply 2 g topically 4 (four) times daily. 100 g 3   divalproex  (DEPAKOTE  ER) 500 MG 24 hr tablet TAKE 1 TABLET(500 MG) BY MOUTH DAILY 90 tablet 0   dofetilide  (TIKOSYN ) 250 MCG capsule TAKE 1 CAPSULE(250 MCG) BY MOUTH TWICE DAILY 60 capsule 11   doxycycline  (VIBRAMYCIN ) 100 MG capsule Take 100 mg by mouth every evening.     Evolocumab  (REPATHA  SURECLICK) 140 MG/ML SOAJ ADMINISTER 1 ML UNDER THE SKIN EVERY 14 DAYS 6 mL 1   ezetimibe  (ZETIA ) 10 MG tablet TAKE 1 TABLET(10 MG) BY MOUTH DAILY 90 tablet 3   furosemide  (LASIX ) 20 MG tablet Take 1 tablet (20 MG) By mouth twice daily. As needed, patient may take 20 MG (1 tablet) additional Lasix   PRN by mouth daily x 4 days as directed per [employer] HF Study PRN plan 180 tablet 3   gabapentin  (NEURONTIN ) 300 MG capsule TAKE 1 CAPSULE(300 MG) BY MOUTH TWICE DAILY 180 capsule 3   Glucosamine-Chondroitin (GLUCOSAMINE CHONDR COMPLEX PO) Take 500 mg by mouth 2 (two) times daily.      guaiFENesin  (MUCINEX ) 600 MG 12 hr tablet Take 600 mg by mouth 2 (two) times daily.     hydrALAZINE  (APRESOLINE ) 50 MG tablet Take 1 tablet (50 mg total) by mouth 3 (three) times daily. 270 tablet 3   isosorbide  mononitrate (IMDUR ) 30 MG 24 hr tablet TAKE 1 TABLET BY MOUTH EVERY DAY 90 tablet 3   montelukast  (SINGULAIR ) 10 MG tablet Take 1 tablet (10 mg total) by mouth at bedtime. 90 tablet 3   Multiple Vitamin (MULTIVITAMIN WITH MINERALS) TABS tablet Take 1 tablet by mouth every evening.     nitroGLYCERIN  (NITROSTAT ) 0.4 MG SL tablet DISSOLVE ONE TABLET UNDER TONGUE AS NEEDED FOR CHEST PAIN EVERY 5 MINUTES 25 tablet 11   Omega-3 Fatty Acids (FISH  OIL) 1200 MG CAPS Take 1,200 mg by mouth 2 (two) times daily.     pantoprazole  (PROTONIX ) 40 MG tablet Take 1 tablet (40 mg total) by mouth daily. 90 tablet 3   potassium chloride  SA (KLOR-CON  M) 20 MEQ tablet TAKE 1 TABLET BY MOUTH EVERY DAY 90 tablet 1   ramipril  (ALTACE ) 10 MG capsule TAKE 2 CAPSULES(20 MG) BY MOUTH DAILY 180 capsule 3   tamsulosin  (FLOMAX ) 0.4 MG CAPS capsule Take 1 capsule (0.4 mg total) by mouth daily after breakfast. 90 capsule 3   Tetrahydrozoline HCl (VISINE OP) Place 1 drop into both eyes daily as needed (irritation).     vitamin C  (ASCORBIC ACID ) 500 MG tablet Take 500 mg by mouth daily.     XARELTO  20 MG TABS tablet TAKE 1 TABLET(20 MG) BY MOUTH DAILY WITH SUPPER 90 tablet 1   zolpidem  (AMBIEN  CR) 12.5 MG CR tablet TAKE 1 TABLET(12.5 MG) BY MOUTH AT BEDTIME 90 tablet 1   No current facility-administered medications for this encounter.    Allergies  Allergen Reactions   Statins     Myalgias with elevated CPKs    ROS- All systems are reviewed and negative except as per the HPI above.  Physical Exam: Vitals:   08/15/24 0901  BP: (!) 150/62  Pulse: (!) 55  Weight: 88.2 kg  Height: 5' 10 (1.778 m)    GEN- The patient is well appearing, alert and oriented x 3 today.   Neck - no JVD or carotid bruit noted Lungs- Clear to ausculation bilaterally, normal work of breathing Heart- Regular rate and rhythm, no murmurs, rubs or gallops, PMI not laterally displaced Extremities- no clubbing, cyanosis, or edema Skin - no rash or ecchymosis noted   Wt Readings from Last 3 Encounters:  08/15/24 88.2 kg  06/01/24 86.2 kg  05/09/24 87.5 kg    ECG today demonstrates  Vent. rate 55 BPM PR interval 224 ms QRS duration 110 ms QT/QTcB 474/453 ms P-R-T axes * -14 27 Sinus bradycardia with 1st degree A-V block with Premature atrial complexes Otherwise normal ECG When compared with ECG of 15-Feb-2024 09:07, Premature ventricular complexes are no longer  Present  Echo 01/21/20 demonstrated   1. Left ventricular ejection fraction, by estimation, is 55 to 60%. The  left ventricle has normal function. Left ventricular endocardial border  not optimally defined to evaluate regional wall motion. There is mild  left  ventricular hypertrophy. Left  ventricular diastolic parameters are consistent with Grade II diastolic  dysfunction (pseudonormalization).   2. Right ventricular systolic function is normal. The right ventricular  size is mildly enlarged. There is mildly elevated pulmonary artery  systolic pressure.   3. Left atrial size was moderately dilated.   4. Right atrial size was mildly dilated.   5. The mitral valve is normal in structure. Mild mitral valve  regurgitation.   6. The aortic valve is tricuspid. Aortic valve regurgitation is mild.  Mild aortic valve sclerosis is present, with no evidence of aortic valve  stenosis.   7. The inferior vena cava is normal in size with greater than 50%  respiratory variability, suggesting right atrial pressure of 3 mmHg.   Epic records are reviewed at length today  CHA2DS2-VASc Score = 3  The patient's score is based upon: CHF History: 0 HTN History: 1 Diabetes History: 0 Stroke History: 0 Vascular Disease History: 1 Age Score: 1 Gender Score: 0      ASSESSMENT AND PLAN: 1. Persistent Atrial Fibrillation (ICD10:  I48.19) The patient's CHA2DS2-VASc score is 3, indicating a 3.2% annual risk of stroke.   S/p afib ablation 03/19/20 with repeat ablation 10/25/22. S/p Tikosyn  admission 6/11-14/24.  Patient is currently in NSR. He is doing well from an Afib standpoint and happy with his current management.  High risk medication monitoring (ICD10: [id number]) Patient requires ongoing monitoring for anti-arrhythmic medication which has the potential to cause life threatening arrhythmias or AV block. Qtc stable. Continue Tikosyn  250 mcg BID. Bmet and mag drawn today.    When he is in atrial  fibrillation, he complains of shortness of breath, at the level of NYHA class II.  2. Secondary Hypercoagulable State (ICD10:  D68.69) The patient is at significant risk for stroke/thromboembolism based upon his CHA2DS2-VASc Score of 3.  Continue Xarelto  No missed doses.   3. HTN Stable today.  4. Obstructive sleep apnea He has Inspire device.   5. CAD s/p CABG No chest pain.      Follow up 6 months for Tikosyn  surveillance.     Fairy Heinrich, PA-C Afib Clinic [facility] [address] [place], [place] [address] [phone] 08/15/2024 9:19 AM

## 2024-08-16 ENCOUNTER — Ambulatory Visit (HOSPITAL_COMMUNITY): Payer: Self-pay | Admitting: Internal Medicine

## 2024-08-16 ENCOUNTER — Telehealth: Payer: Self-pay

## 2024-08-16 DIAGNOSIS — I25729 Atherosclerosis of autologous artery coronary artery bypass graft(s) with unspecified angina pectoris: Secondary | ICD-10-CM

## 2024-08-16 DIAGNOSIS — K219 Gastro-esophageal reflux disease without esophagitis: Secondary | ICD-10-CM

## 2024-08-16 DIAGNOSIS — G4733 Obstructive sleep apnea (adult) (pediatric): Secondary | ICD-10-CM

## 2024-08-16 DIAGNOSIS — G4700 Insomnia, unspecified: Secondary | ICD-10-CM

## 2024-08-16 DIAGNOSIS — I4819 Other persistent atrial fibrillation: Secondary | ICD-10-CM

## 2024-08-16 LAB — BASIC METABOLIC PANEL WITH GFR
BUN/Creatinine Ratio: 24 (ref 10–24)
BUN: 33 mg/dL — ABNORMAL HIGH (ref 8–27)
CO2: 22 mmol/L (ref 20–29)
Calcium: 9.3 mg/dL (ref 8.6–10.2)
Chloride: 102 mmol/L (ref 96–106)
Creatinine, Ser: 1.4 mg/dL — ABNORMAL HIGH (ref 0.76–1.27)
Glucose: 95 mg/dL (ref 70–99)
Potassium: 4.6 mmol/L (ref 3.5–5.2)
Sodium: 139 mmol/L (ref 134–144)
eGFR: 53 mL/min/1.73 — ABNORMAL LOW (ref >59)

## 2024-08-16 LAB — MAGNESIUM: Magnesium: 2.2 mg/dL (ref 1.6–2.3)

## 2024-08-16 NOTE — Telephone Encounter (Signed)
**Note De-Identified Chanan Detwiler Obfuscation** Ordering provider: Dr Shlomo Associated diagnoses: OSA-G47.33 and HTN-I10  WatchPAT PA obtained on 08/16/2024 by Brae Schaafsma, Avelina HERO, LPN. Authorization: Per the [facility] provider portal, this Itamar-HST PA has been approved from 08/16/2024 - 11/14/2024. Outpatient Authorization [id number]  Patient notified of PIN (1234) on 08/16/2024 Alfard Cochrane Notification Method: phone. The pt states that he plans to come by the office either tomorrow 08/17/24 or Monday 08/20/24 to pick up a WatchPAT One-HST device with instructions.  Phone note routed to covering staff for follow-up.

## 2024-08-20 NOTE — Telephone Encounter (Signed)
**Note De-Identified See Beharry Obfuscation** Letter received from [facility] fax stating that they have approved the pts Home Sleep Study Type 3 from 08/20/2024-11/18/2024. Authorization #: [id number]  I have transferred the order to the sleep lab and I called the pt and made him aware of this approval and I gave him the sleep lab's phone number so he can call them to schedule.

## 2024-08-20 NOTE — Telephone Encounter (Signed)
**Note De-Identified Dorreen Valiente Obfuscation** [name] Wilbert SAUNDERS, MD to Me (Selected Message)     08/20/24 11:11 AM That is fine

## 2024-08-20 NOTE — Telephone Encounter (Signed)
**Note De-Identified [name] Brown Obfuscation** The pt arrived at the office to pick up a Itamar-HST device with instructions.  When we attempted to download the WatchPAT One app on his phone it would not download and the pt stated that he needs a new phone as his has not let him download an app in months.  He stated that he did a Home Sleep Test years ago that was given to him by the sleep lab and wants to know if he can do that one this time to prevent having to purchase a new phone.  He is aware that I am going to discuss with Dr Shlomo and that I will call him back once all has bee taken care of. He verbalized understanding and thanked me for my assistance.

## 2024-08-20 NOTE — Telephone Encounter (Signed)
**Note De-Identified Marvis Bakken Obfuscation** I started a Home Sleep Study Type 3 PA through the [facility] provider portal and it is currently pending review. Outpatient Authorization [id number]

## 2024-08-25 ENCOUNTER — Encounter

## 2024-08-26 ENCOUNTER — Ambulatory Visit: Attending: Cardiology

## 2024-08-26 ENCOUNTER — Encounter

## 2024-08-26 DIAGNOSIS — I4819 Other persistent atrial fibrillation: Secondary | ICD-10-CM | POA: Diagnosis not present

## 2024-08-27 ENCOUNTER — Ambulatory Visit: Payer: Self-pay | Admitting: Family Medicine

## 2024-08-27 ENCOUNTER — Ambulatory Visit (INDEPENDENT_AMBULATORY_CARE_PROVIDER_SITE_OTHER)

## 2024-08-27 ENCOUNTER — Ambulatory Visit (INDEPENDENT_AMBULATORY_CARE_PROVIDER_SITE_OTHER): Admitting: Family Medicine

## 2024-08-27 ENCOUNTER — Ambulatory Visit: Payer: Self-pay

## 2024-08-27 VITALS — BP 140/72 | HR 50 | Temp 97.6°F | Resp 18 | Ht 70.0 in | Wt 193.0 lb

## 2024-08-27 DIAGNOSIS — N1831 Chronic kidney disease, stage 3a: Secondary | ICD-10-CM

## 2024-08-27 DIAGNOSIS — W19XXXA Unspecified fall, initial encounter: Secondary | ICD-10-CM | POA: Diagnosis not present

## 2024-08-27 DIAGNOSIS — N289 Disorder of kidney and ureter, unspecified: Secondary | ICD-10-CM

## 2024-08-27 DIAGNOSIS — M25512 Pain in left shoulder: Secondary | ICD-10-CM | POA: Diagnosis not present

## 2024-08-27 LAB — CUP PACEART REMOTE DEVICE CHECK
Date Time Interrogation Session: 20251115230655
Implantable Pulse Generator Implant Date: 20240506

## 2024-08-27 MED ORDER — HYDROCODONE-ACETAMINOPHEN 5-325 MG PO TABS: 1.0000 | ORAL_TABLET | Freq: Four times a day (QID) | ORAL | 0 refills | Status: AC | PRN

## 2024-08-27 NOTE — Progress Notes (Signed)
 Assessment & Plan Acute pain of left shoulder Acute left shoulder pain with limited range of motion and tenderness. - Ordered x-ray of left shoulder. - Prescribed hydrocodone  for pain management. - Advised to limit Tylenol  intake to 3,000 mg per day. - Discussed potential need for physical therapy and MRI if no improvement after six weeks. Orders:   HYDROcodone -acetaminophen  (NORCO/VICODIN) 5-325 MG tablet; Take 1 tablet by mouth every 6 (six) hours as needed for up to 5 days for moderate pain (pain score 4-6).   DG Shoulder Left; Future  Fall, initial encounter  Orders:   DG Shoulder Left; Future  Kidney function abnormal Decreased eGFR and increased creatinine. Possible age-related decline, dehydration, or medication effects. - Advised on maintaining adequate hydration.  Orders:   Basic metabolic panel with GFR; Future   Follow up plan: Return if symptoms worsen or fail to improve.  Niki Rung, MSN, APRN, FNP-C  Subjective:  HPI: [name] Brown is a 73 y.o. male presenting on 08/27/2024 for Fall (Fall at the gas station - tripped over the hose. Left side hit - shoulder, hip and knee. The shoulder is still bothering him./Happened last Thursday )  Discussed the use of AI scribe software for clinical note transcription with the patient, who gave verbal consent to proceed.  He is accompanied by his wife, who he is okay with being present.  He experienced a fall at a gas station four days ago, tripping over a hose and landing on his left side, impacting his shoulder, hip, and knee. Since the fall, there has been improvement in his knee and hip, but he continues to have persistent pain in his left shoulder and arm. The shoulder pain is described as sharp, and a bruise appeared yesterday. The pain is significant, especially when rotating his arm or moving it behind his back.  He has been taking Tylenol  Extra Strength, 500 mg, two tablets every four hours for pain management.  Despite this, he continues to experience significant pain, particularly with certain movements.  He mentions a history of hip problems, although his hip is currently feeling better.  Recent labs from the AFib clinic showed a decrease in kidney function, with a GFR dropping from the 70s to 53 and creatinine levels rising from 1.1-1.2 to 1.4. He is not currently taking NSAIDs, only Tylenol .      ROS: Negative unless specifically indicated above in HPI.   Relevant past medical history reviewed and updated as indicated.   Allergies and medications reviewed and updated.   Current Outpatient Medications:    acetaminophen  (TYLENOL ) 500 MG tablet, Take 1,000 mg by mouth every 4 (four) hours as needed for moderate pain., Disp: , Rfl:    albuterol  (VENTOLIN  HFA) 108 (90 Base) MCG/ACT inhaler, Inhale 2 puffs into the lungs every 6 (six) hours as needed for wheezing or shortness of breath., Disp: 54 g, Rfl: 1   aspirin  EC 81 MG EC tablet, Take 1 tablet (81 mg total) by mouth daily., Disp: , Rfl:    Biotin 89999 MCG TABS, Take 10,000 mcg by mouth in the morning., Disp: , Rfl:    cholecalciferol  (VITAMIN D) 25 MCG (1000 UT) tablet, Take 1,000 Units by mouth every evening., Disp: , Rfl:    cyclobenzaprine  (FLEXERIL ) 5 MG tablet, Take 1 tablet (5 mg total) by mouth 3 (three) times daily as needed for muscle spasms., Disp: 60 tablet, Rfl: 0   diclofenac  sodium (VOLTAREN ) 1 % GEL, Apply 2 g topically 4 (four) times  daily., Disp: 100 g, Rfl: 3   divalproex  (DEPAKOTE  ER) 500 MG 24 hr tablet, TAKE 1 TABLET(500 MG) BY MOUTH DAILY, Disp: 90 tablet, Rfl: 0   dofetilide  (TIKOSYN ) 250 MCG capsule, TAKE 1 CAPSULE(250 MCG) BY MOUTH TWICE DAILY, Disp: 60 capsule, Rfl: 11   doxycycline  (VIBRAMYCIN ) 100 MG capsule, Take 100 mg by mouth every evening., Disp: , Rfl:    Evolocumab  (REPATHA  SURECLICK) 140 MG/ML SOAJ, ADMINISTER 1 ML UNDER THE SKIN EVERY 14 DAYS, Disp: 6 mL, Rfl: 1   ezetimibe  (ZETIA ) 10 MG tablet, TAKE 1  TABLET(10 MG) BY MOUTH DAILY, Disp: 90 tablet, Rfl: 3   furosemide  (LASIX ) 20 MG tablet, Take 1 tablet (20 MG) By mouth twice daily. As needed, patient may take 20 MG (1 tablet) additional Lasix  PRN by mouth daily x 4 days as directed per [employer] HF Study PRN plan, Disp: 180 tablet, Rfl: 3   gabapentin  (NEURONTIN ) 300 MG capsule, TAKE 1 CAPSULE(300 MG) BY MOUTH TWICE DAILY, Disp: 180 capsule, Rfl: 3   Glucosamine-Chondroitin (GLUCOSAMINE CHONDR COMPLEX PO), Take 500 mg by mouth 2 (two) times daily. , Disp: , Rfl:    guaiFENesin  (MUCINEX ) 600 MG 12 hr tablet, Take 600 mg by mouth 2 (two) times daily., Disp: , Rfl:    hydrALAZINE  (APRESOLINE ) 50 MG tablet, Take 1 tablet (50 mg total) by mouth 3 (three) times daily., Disp: 270 tablet, Rfl: 3   HYDROcodone -acetaminophen  (NORCO/VICODIN) 5-325 MG tablet, Take 1 tablet by mouth every 6 (six) hours as needed for up to 5 days for moderate pain (pain score 4-6)., Disp: 20 tablet, Rfl: 0   isosorbide  mononitrate (IMDUR ) 30 MG 24 hr tablet, TAKE 1 TABLET BY MOUTH EVERY DAY, Disp: 90 tablet, Rfl: 3   montelukast  (SINGULAIR ) 10 MG tablet, Take 1 tablet (10 mg total) by mouth at bedtime., Disp: 90 tablet, Rfl: 3   Multiple Vitamin (MULTIVITAMIN WITH MINERALS) TABS tablet, Take 1 tablet by mouth every evening., Disp: , Rfl:    nitroGLYCERIN  (NITROSTAT ) 0.4 MG SL tablet, DISSOLVE ONE TABLET UNDER TONGUE AS NEEDED FOR CHEST PAIN EVERY 5 MINUTES, Disp: 25 tablet, Rfl: 11   Omega-3 Fatty Acids (FISH OIL) 1200 MG CAPS, Take 1,200 mg by mouth 2 (two) times daily., Disp: , Rfl:    pantoprazole  (PROTONIX ) 40 MG tablet, Take 1 tablet (40 mg total) by mouth daily., Disp: 90 tablet, Rfl: 3   potassium chloride  SA (KLOR-CON  M) 20 MEQ tablet, TAKE 1 TABLET BY MOUTH EVERY DAY, Disp: 90 tablet, Rfl: 1   ramipril  (ALTACE ) 10 MG capsule, TAKE 2 CAPSULES(20 MG) BY MOUTH DAILY, Disp: 180 capsule, Rfl: 3   tamsulosin  (FLOMAX ) 0.4 MG CAPS capsule, Take 1 capsule (0.4 mg total)  by mouth daily after breakfast., Disp: 90 capsule, Rfl: 3   Tetrahydrozoline HCl (VISINE OP), Place 1 drop into both eyes daily as needed (irritation)., Disp: , Rfl:    vitamin C  (ASCORBIC ACID ) 500 MG tablet, Take 500 mg by mouth daily., Disp: , Rfl:    XARELTO  20 MG TABS tablet, TAKE 1 TABLET(20 MG) BY MOUTH DAILY WITH SUPPER, Disp: 90 tablet, Rfl: 1   zolpidem  (AMBIEN  CR) 12.5 MG CR tablet, TAKE 1 TABLET(12.5 MG) BY MOUTH AT BEDTIME, Disp: 90 tablet, Rfl: 1  Allergies  Allergen Reactions   Statins     Myalgias with elevated CPKs    Objective:   BP (!) 140/72   Pulse (!) 50   Temp 97.6 F (36.4 C)   Resp 18  Ht 5' 10 (1.778 m)   Wt 193 lb (87.5 kg)   SpO2 94%   BMI 27.69 kg/m    Physical Exam Vitals reviewed.  Constitutional:      General: He is not in acute distress.    Appearance: Normal appearance. He is not ill-appearing, toxic-appearing or diaphoretic.  HENT:     Head: Normocephalic and atraumatic.  Eyes:     General: No scleral icterus.       Right eye: No discharge.        Left eye: No discharge.     Conjunctiva/sclera: Conjunctivae normal.  Cardiovascular:     Rate and Rhythm: Normal rate.  Pulmonary:     Effort: Pulmonary effort is normal. No respiratory distress.  Musculoskeletal:     Left shoulder: Tenderness present. No swelling, deformity, bony tenderness or crepitus. Decreased range of motion. Normal strength.     Cervical back: Normal range of motion.  Skin:    General: Skin is warm and dry.     Findings: Bruising (left upper arm) present.  Neurological:     Mental Status: He is alert and oriented to person, place, and time. Mental status is at baseline.  Psychiatric:        Mood and Affect: Mood normal.        Behavior: Behavior normal.        Thought Content: Thought content normal.        Judgment: Judgment normal.

## 2024-08-27 NOTE — Telephone Encounter (Signed)
 FYI Only or Action Required?: FYI only for provider: appointment scheduled on 08/27/2024.  Patient was last seen in primary care on 06/01/2024 by Rollene Almarie LABOR, MD.  Called Nurse Triage reporting Fall.  Symptoms began a week ago.  Triage Disposition: See HCP Within 4 Hours (Or PCP Triage)  Patient/caregiver understands and will follow disposition?: Yes           Copied from CRM #[id number]. Topic: Clinical - Red Word Triage >> Aug 27, 2024  9:52 AM Terri MATSU wrote: Red Word that prompted transfer to Nurse Triage: Took a fall last week and have bruises on upper left arm and shoulder is in pain. Reason for Disposition  [1] MODERATE weakness (e.g., interferes with work, school, normal activities) AND [2] new-onset or getting worse  Answer Assessment - Initial Assessment Questions Fall last week at gas station, pt tripped Hit left upper arm and shoulder; denies hitting head Bruising 8-9/10 pain level intermittent; taking Tylenol  Denies wounds, numbness  Protocols used: Falls and [facility]

## 2024-08-28 ENCOUNTER — Ambulatory Visit: Payer: Self-pay | Admitting: Cardiology

## 2024-08-28 LAB — BASIC METABOLIC PANEL WITH GFR
BUN: 30 mg/dL — ABNORMAL HIGH (ref 6–23)
CO2: 26 meq/L (ref 19–32)
Calcium: 9.5 mg/dL (ref 8.4–10.5)
Chloride: 104 meq/L (ref 96–112)
Creatinine, Ser: 1.54 mg/dL — ABNORMAL HIGH (ref 0.40–1.50)
GFR: 44.72 mL/min — ABNORMAL LOW (ref >60.00)
Glucose, Bld: 97 mg/dL (ref 70–99)
Potassium: 4.3 meq/L (ref 3.5–5.1)
Sodium: 140 meq/L (ref 135–145)

## 2024-08-28 NOTE — Progress Notes (Signed)
 Remote Loop Recorder Transmission

## 2024-08-28 NOTE — Telephone Encounter (Signed)
 Called to check on pt. Still having some pain and soreness. Pt has pain meds which are helping him to manage. Pt did not want to be seen at this time. Advised pt to give us  a call if things worsen. Pt verbalized understanding.

## 2024-09-03 ENCOUNTER — Encounter: Payer: Self-pay | Admitting: Family Medicine

## 2024-09-03 DIAGNOSIS — N1831 Chronic kidney disease, stage 3a: Secondary | ICD-10-CM | POA: Insufficient documentation

## 2024-09-04 ENCOUNTER — Ambulatory Visit (HOSPITAL_BASED_OUTPATIENT_CLINIC_OR_DEPARTMENT_OTHER): Admitting: Cardiology

## 2024-09-10 ENCOUNTER — Other Ambulatory Visit: Payer: Self-pay | Admitting: Cardiology

## 2024-09-25 ENCOUNTER — Encounter

## 2024-09-26 ENCOUNTER — Encounter

## 2024-09-26 ENCOUNTER — Ambulatory Visit

## 2024-09-26 DIAGNOSIS — I4819 Other persistent atrial fibrillation: Secondary | ICD-10-CM

## 2024-09-27 ENCOUNTER — Ambulatory Visit: Payer: Self-pay | Admitting: Cardiology

## 2024-09-27 LAB — CUP PACEART REMOTE DEVICE CHECK
Date Time Interrogation Session: 20251216230642
Implantable Pulse Generator Implant Date: 20240506

## 2024-09-27 NOTE — Progress Notes (Signed)
 Remote Loop Recorder Transmission

## 2024-09-30 ENCOUNTER — Other Ambulatory Visit: Payer: Self-pay | Admitting: Cardiovascular Disease

## 2024-10-01 ENCOUNTER — Ambulatory Visit (HOSPITAL_BASED_OUTPATIENT_CLINIC_OR_DEPARTMENT_OTHER): Attending: Cardiology | Admitting: Cardiology

## 2024-10-01 ENCOUNTER — Other Ambulatory Visit: Payer: Self-pay

## 2024-10-01 DIAGNOSIS — G4736 Sleep related hypoventilation in conditions classified elsewhere: Secondary | ICD-10-CM | POA: Diagnosis not present

## 2024-10-01 DIAGNOSIS — I4819 Other persistent atrial fibrillation: Secondary | ICD-10-CM | POA: Diagnosis present

## 2024-10-01 DIAGNOSIS — Z9682 Presence of neurostimulator: Secondary | ICD-10-CM | POA: Diagnosis not present

## 2024-10-01 DIAGNOSIS — G4700 Insomnia, unspecified: Secondary | ICD-10-CM | POA: Insufficient documentation

## 2024-10-01 DIAGNOSIS — G4733 Obstructive sleep apnea (adult) (pediatric): Secondary | ICD-10-CM | POA: Insufficient documentation

## 2024-10-01 DIAGNOSIS — K219 Gastro-esophageal reflux disease without esophagitis: Secondary | ICD-10-CM | POA: Diagnosis not present

## 2024-10-01 DIAGNOSIS — Z9889 Other specified postprocedural states: Secondary | ICD-10-CM

## 2024-10-01 DIAGNOSIS — I25729 Atherosclerosis of autologous artery coronary artery bypass graft(s) with unspecified angina pectoris: Secondary | ICD-10-CM | POA: Insufficient documentation

## 2024-10-09 NOTE — Procedures (Signed)
" °%%  startinterp%% Indications for Polysomnography The patient is a 72 year old Male who is 5' 10 and weighs 194.0 lbs. His BMI equals 28.1.  A home sleep apnea test was performed to evaluate for Obstructive Sleep Apnea using Inspire device.  MedicationNo Data. Polysomnogram Data A home sleep test recorded the standard physiologic parameters including EKG, nasal and oral airflow.  Respiratory parameters of chest and abdominal movements were recorded with Respiratory Inductance Plethysmography belts.  Oxygen saturation was  recorded by pulse oximetry.  Study Architecture The total recording time of the polysomnogram was 442.0 minutes.  The total monitoring time was 442.5 minutes.  Time spent in Supine position was 182.0 minutes.  Respiratory Events The study revealed a presence of 212 obstructive, 24 centrals, and 0 mixed apneas resulting in an Apnea index of 32.0 events per hour.  There were 101 hypopneas (GreaterEqual to3% desaturation and/or arousal) resulting in an Apnea\Hypopnea Index (AHI  GreaterEqual to3% desaturation and/or arousal) of 45.7 events per hour.  There were 54 hypopneas (GreaterEqual to4% desaturation) resulting in an Apnea\Hypopnea Index (AHI GreaterEqual to4% desaturation) of 39.3 events per hour.  There were 0 Respiratory  Effort Related Arousals resulting in a RERA index of 0 events per hour. The Respiratory Disturbance Index is 45.7 events per hour.  The snore index was 0 events per hour.  Mean oxygen saturation was 91.5%.  The lowest oxygen saturation during monitoring time was 84.0%.  Time spent LessEqual to88% oxygen saturation was  minutes ().  Cardiac Summary The average pulse rate was 51.9 bpm.  The minimum pulse rate was 44.0 bpm while the maximum pulse rate was 81.0 bpm.  Diagnosis: Severe Obstructive Sleep Apnea Nocturnal Hypoxemia  Recommendations: 1. Recommend verification that patient had Inspire device turned on during sleep study.  Will need close  followup in the office with Inspire rep for fine tuning of device due to ongoing respiratory events. 2. Close follow-up is necessary to ensure success with CPAP or oral appliance therapy for maximum benefit 3. A follow-up oximetry study on CPAP is recommended to assess the adequacy of therapy and determine the need for supplemental oxygen or the potential need for Bi-level therapy.  An arterial blood gas to determine the adequacy of baseline ventilation and  oxygenation should also be considered. 4. Healthy sleep recommendations include:  adequate nightly sleep (normal 7-9 hrs/night), avoidance of caffeine after noon and alcohol near bedtime, and maintaining a sleep environment that is cool, dark and quiet. 5. Weight loss for overweight patients is recommended.  Even modest amounts of weight loss can significantly improve the severity of sleep apnea. 6. Snoring recommendations include:  weight loss where appropriate, side sleeping, and avoidance of alcohol before bed. 7. Operation of motor vehicle should be avoided when sleepy.   This study was personally reviewed and electronically signed by: Dr. Wilbert Bihari Accredited Board Certified in Sleep Medicine Date/Time: 10/09/2024 3:47PM "

## 2024-10-10 ENCOUNTER — Telehealth: Payer: Self-pay | Admitting: Pharmacist

## 2024-10-10 DIAGNOSIS — I10 Essential (primary) hypertension: Secondary | ICD-10-CM

## 2024-10-10 NOTE — Telephone Encounter (Signed)
 Pharmacy Quality Measure Review  This patient is appearing on a report for being at risk of failing the Controlling Blood Pressure measure this calendar year.   Last documented BP 140/72 on 08/27/24  Contacted patient. He notes his BP have been doing well at home. He checks every Monday and his most recent BP was 115/63. He denies dizziness. He plans to make an appt to see PCP around February for follow up but would like to call back another day to schedule.  Darrelyn Drum, PharmD, BCPS, CPP Clinical Pharmacist Practitioner [facility] [phone]

## 2024-10-18 ENCOUNTER — Encounter: Payer: Self-pay | Admitting: *Deleted

## 2024-10-18 NOTE — Progress Notes (Signed)
 [name] Brown                                          MRN: [id number]   10/18/2024   The [facility] Quality Team Specialist reviewed this patient medical record for the purposes of chart review for care gap closure. The following were reviewed: chart review for care gap closure-controlling blood pressure.    [facility] Quality Team

## 2024-10-26 ENCOUNTER — Ambulatory Visit (HOSPITAL_COMMUNITY)

## 2024-10-26 ENCOUNTER — Encounter

## 2024-10-26 ENCOUNTER — Ambulatory Visit

## 2024-10-27 ENCOUNTER — Ambulatory Visit: Attending: Cardiology

## 2024-10-27 ENCOUNTER — Encounter

## 2024-10-27 DIAGNOSIS — I4819 Other persistent atrial fibrillation: Secondary | ICD-10-CM | POA: Diagnosis not present

## 2024-10-29 LAB — CUP PACEART REMOTE DEVICE CHECK
Date Time Interrogation Session: 20260116230611
Implantable Pulse Generator Implant Date: 20240506

## 2024-10-30 ENCOUNTER — Ambulatory Visit: Payer: Self-pay | Admitting: Cardiology

## 2024-11-01 NOTE — Progress Notes (Signed)
 Remote Loop Recorder Transmission

## 2024-11-06 ENCOUNTER — Telehealth: Payer: Self-pay | Admitting: *Deleted

## 2024-11-06 NOTE — Telephone Encounter (Signed)
-----   Message from Wilbert Bihari, MD sent at 10/09/2024  3:48 PM EST ----- Please verify that patient had his Inspire device on when doing HST.  Study showed severe OSA and nocturnal hypoxemia

## 2024-11-06 NOTE — Telephone Encounter (Signed)
 The patient has been notified of the result and verbalized understanding.  All questions (if any) were answered. Joshua Dalton Seip, CMA 11/06/2024 5:10 PM     Patient states YES he did have it on during his test but had trouble keeping the strap situated on his chest. It kept moving down.

## 2024-11-12 ENCOUNTER — Other Ambulatory Visit: Payer: Self-pay | Admitting: Cardiology

## 2024-11-12 DIAGNOSIS — I4819 Other persistent atrial fibrillation: Secondary | ICD-10-CM

## 2024-11-12 NOTE — Telephone Encounter (Signed)
 Xarelto  20mg  refill request received. Pt is 74 years old, weight-88kg, Crea-1.54 on 08/27/24, last seen by Fairy Heinrich on 08/15/24, Diagnosis-Afib, CrCl-53.97 mL/min; Dose is appropriate based on dosing criteria. Will send in refill to requested pharmacy.

## 2024-11-20 ENCOUNTER — Ambulatory Visit: Admitting: Internal Medicine

## 2024-11-26 ENCOUNTER — Encounter

## 2024-11-27 ENCOUNTER — Ambulatory Visit

## 2024-12-27 ENCOUNTER — Encounter

## 2024-12-28 ENCOUNTER — Ambulatory Visit

## 2024-12-28 ENCOUNTER — Ambulatory Visit (HOSPITAL_COMMUNITY)

## 2025-02-14 ENCOUNTER — Ambulatory Visit (HOSPITAL_COMMUNITY): Admitting: Internal Medicine

## 2025-02-15 ENCOUNTER — Ambulatory Visit: Admitting: Cardiovascular Disease

## 2025-05-10 ENCOUNTER — Ambulatory Visit

## 2025-06-03 ENCOUNTER — Encounter: Admitting: Internal Medicine
